# Patient Record
Sex: Female | Born: 1955 | Race: Black or African American | Hispanic: No | Smoking: Former smoker
Health system: Southern US, Community
[De-identification: ages and names within clinical notes are randomized; demographics above are authoritative.]

## PROBLEM LIST (undated history)

## (undated) ENCOUNTER — Emergency Department (HOSPITAL_COMMUNITY): Admission: EM

## (undated) DIAGNOSIS — R569 Unspecified convulsions: Secondary | ICD-10-CM

## (undated) DIAGNOSIS — G43909 Migraine, unspecified, not intractable, without status migrainosus: Secondary | ICD-10-CM

## (undated) DIAGNOSIS — F32A Depression, unspecified: Secondary | ICD-10-CM

## (undated) DIAGNOSIS — I2510 Atherosclerotic heart disease of native coronary artery without angina pectoris: Secondary | ICD-10-CM

## (undated) DIAGNOSIS — G4733 Obstructive sleep apnea (adult) (pediatric): Secondary | ICD-10-CM

## (undated) DIAGNOSIS — E119 Type 2 diabetes mellitus without complications: Secondary | ICD-10-CM

## (undated) DIAGNOSIS — I6529 Occlusion and stenosis of unspecified carotid artery: Secondary | ICD-10-CM

## (undated) DIAGNOSIS — I639 Cerebral infarction, unspecified: Secondary | ICD-10-CM

## (undated) DIAGNOSIS — N200 Calculus of kidney: Secondary | ICD-10-CM

## (undated) DIAGNOSIS — Z9889 Other specified postprocedural states: Secondary | ICD-10-CM

## (undated) DIAGNOSIS — R55 Syncope and collapse: Secondary | ICD-10-CM

## (undated) DIAGNOSIS — Z8489 Family history of other specified conditions: Secondary | ICD-10-CM

## (undated) DIAGNOSIS — I10 Essential (primary) hypertension: Secondary | ICD-10-CM

## (undated) DIAGNOSIS — R112 Nausea with vomiting, unspecified: Secondary | ICD-10-CM

## (undated) DIAGNOSIS — E785 Hyperlipidemia, unspecified: Secondary | ICD-10-CM

## (undated) DIAGNOSIS — G40309 Generalized idiopathic epilepsy and epileptic syndromes, not intractable, without status epilepticus: Secondary | ICD-10-CM

## (undated) DIAGNOSIS — R413 Other amnesia: Secondary | ICD-10-CM

## (undated) DIAGNOSIS — K219 Gastro-esophageal reflux disease without esophagitis: Secondary | ICD-10-CM

## (undated) DIAGNOSIS — Z87442 Personal history of urinary calculi: Secondary | ICD-10-CM

## (undated) HISTORY — DX: Syncope and collapse: R55

## (undated) HISTORY — PX: URETERAL STENT PLACEMENT: SHX822

## (undated) HISTORY — DX: Atherosclerotic heart disease of native coronary artery without angina pectoris: I25.10

## (undated) HISTORY — PX: LITHOTRIPSY: SUR834

## (undated) HISTORY — DX: Other amnesia: R41.3

## (undated) HISTORY — DX: Hyperlipidemia, unspecified: E78.5

## (undated) HISTORY — PX: TUBAL LIGATION: SHX77

## (undated) HISTORY — PX: CORONARY ANGIOPLASTY: SHX604

## (undated) HISTORY — DX: Occlusion and stenosis of unspecified carotid artery: I65.29

## (undated) HISTORY — PX: TOTAL ABDOMINAL HYSTERECTOMY: SHX209

## (undated) HISTORY — DX: Obstructive sleep apnea (adult) (pediatric): G47.33

## (undated) HISTORY — DX: Calculus of kidney: N20.0

## (undated) HISTORY — DX: Generalized idiopathic epilepsy and epileptic syndromes, not intractable, without status epilepticus: G40.309

## (undated) HISTORY — DX: Cerebral infarction, unspecified: I63.9

## (undated) HISTORY — PX: CARDIAC CATHETERIZATION: SHX172

---

## 2012-10-21 HISTORY — PX: CORONARY ARTERY BYPASS GRAFT: SHX141

## 2012-11-16 LAB — PULMONARY FUNCTION TEST

## 2013-01-11 ENCOUNTER — Encounter (HOSPITAL_COMMUNITY)
Admission: RE | Admit: 2013-01-11 | Discharge: 2013-01-11 | Disposition: A | Discharge status: Home / Self Care | Payer: BC Managed Care – PPO | Source: Ambulatory Visit | Attending: Cardiology | Admitting: Cardiology

## 2013-01-11 DIAGNOSIS — R079 Chest pain, unspecified: Secondary | ICD-10-CM | POA: Insufficient documentation

## 2013-01-11 DIAGNOSIS — Z5189 Encounter for other specified aftercare: Secondary | ICD-10-CM | POA: Insufficient documentation

## 2013-01-11 DIAGNOSIS — E119 Type 2 diabetes mellitus without complications: Secondary | ICD-10-CM | POA: Insufficient documentation

## 2013-01-11 DIAGNOSIS — I10 Essential (primary) hypertension: Secondary | ICD-10-CM | POA: Insufficient documentation

## 2013-01-11 DIAGNOSIS — I2510 Atherosclerotic heart disease of native coronary artery without angina pectoris: Secondary | ICD-10-CM | POA: Insufficient documentation

## 2013-01-11 NOTE — Progress Notes (Signed)
Cardiac Rehab Medication Review by a Pharmacist  Does the patient  feel that his/her medications are working for him/her?  Yes, but has been on these medications before open heart surgery.   Has the patient been experiencing any side effects to the medications prescribed?  no  Does the patient measure his/her own blood pressure or blood glucose at home?  Yes.   Does the patient have any problems obtaining medications due to transportation or finances?   no  Understanding of regimen: good Understanding of indications: good Potential of compliance: good    Samantha Huffman 01/11/2013 9:14 AM

## 2013-01-17 ENCOUNTER — Encounter (HOSPITAL_COMMUNITY): Payer: Self-pay

## 2013-01-17 ENCOUNTER — Encounter (HOSPITAL_COMMUNITY)
Admission: RE | Admit: 2013-01-17 | Discharge: 2013-01-17 | Disposition: A | Discharge status: Home / Self Care | Payer: BC Managed Care – PPO | Source: Ambulatory Visit | Attending: Cardiology | Admitting: Cardiology

## 2013-01-17 ENCOUNTER — Encounter (HOSPITAL_COMMUNITY): Payer: BC Managed Care – PPO

## 2013-01-17 LAB — GLUCOSE, CAPILLARY: Glucose-Capillary: 98 mg/dL (ref 70–99)

## 2013-01-17 NOTE — Addendum Note (Signed)
Encounter addended by: Robyne Peers, RN on: 01/17/2013 12:42 PM<BR>     Documentation filed: Notes Section

## 2013-01-17 NOTE — Progress Notes (Signed)
Pt started cardiac rehab today.  Pt tolerated light exercise without difficulty. VSS, telemetry-NSR, with PVC.  Pt c/o chest wall tenderness and soreness while using treadmill. Relieved while walking track.  Pt asymptomatic with other activities. Pt states she is currently living in [place] with her daughter, who is her caregiver.[place] her permanent residence is in [place].  Pt is having to make a tough decision whether to move to [place] permanently for assistance from her daughters.  Pt recently stopped smoking and states this has been an easy transition for her and she has not had cravings. Pt oriented to exercise equipment and routine.  Understanding verbalized.

## 2013-01-19 ENCOUNTER — Encounter (HOSPITAL_COMMUNITY)
Admission: RE | Admit: 2013-01-19 | Discharge: 2013-01-19 | Disposition: A | Discharge status: Home / Self Care | Payer: BC Managed Care – PPO | Source: Ambulatory Visit | Attending: Cardiology | Admitting: Cardiology

## 2013-01-19 ENCOUNTER — Encounter (HOSPITAL_COMMUNITY): Payer: BC Managed Care – PPO

## 2013-01-19 LAB — GLUCOSE, CAPILLARY: Glucose-Capillary: 143 mg/dL — ABNORMAL HIGH (ref 70–99)

## 2013-01-22 ENCOUNTER — Encounter (HOSPITAL_COMMUNITY)
Admission: RE | Admit: 2013-01-22 | Discharge: 2013-01-22 | Disposition: A | Discharge status: Home / Self Care | Payer: BC Managed Care – PPO | Source: Ambulatory Visit | Attending: Cardiology | Admitting: Cardiology

## 2013-01-22 ENCOUNTER — Encounter (HOSPITAL_COMMUNITY): Payer: BC Managed Care – PPO

## 2013-01-22 DIAGNOSIS — I10 Essential (primary) hypertension: Secondary | ICD-10-CM | POA: Insufficient documentation

## 2013-01-22 DIAGNOSIS — R079 Chest pain, unspecified: Secondary | ICD-10-CM | POA: Insufficient documentation

## 2013-01-22 DIAGNOSIS — Z5189 Encounter for other specified aftercare: Secondary | ICD-10-CM | POA: Insufficient documentation

## 2013-01-22 DIAGNOSIS — E119 Type 2 diabetes mellitus without complications: Secondary | ICD-10-CM | POA: Insufficient documentation

## 2013-01-22 DIAGNOSIS — I2510 Atherosclerotic heart disease of native coronary artery without angina pectoris: Secondary | ICD-10-CM | POA: Insufficient documentation

## 2013-01-24 ENCOUNTER — Encounter (HOSPITAL_COMMUNITY)
Admission: RE | Admit: 2013-01-24 | Discharge: 2013-01-24 | Disposition: A | Discharge status: Home / Self Care | Payer: BC Managed Care – PPO | Source: Ambulatory Visit | Attending: Cardiology | Admitting: Cardiology

## 2013-01-24 ENCOUNTER — Encounter (HOSPITAL_COMMUNITY): Payer: BC Managed Care – PPO

## 2013-01-24 DIAGNOSIS — I10 Essential (primary) hypertension: Secondary | ICD-10-CM | POA: Insufficient documentation

## 2013-01-24 DIAGNOSIS — R079 Chest pain, unspecified: Secondary | ICD-10-CM | POA: Insufficient documentation

## 2013-01-24 DIAGNOSIS — E119 Type 2 diabetes mellitus without complications: Secondary | ICD-10-CM | POA: Insufficient documentation

## 2013-01-24 DIAGNOSIS — I2510 Atherosclerotic heart disease of native coronary artery without angina pectoris: Secondary | ICD-10-CM | POA: Insufficient documentation

## 2013-01-24 DIAGNOSIS — Z5189 Encounter for other specified aftercare: Secondary | ICD-10-CM | POA: Insufficient documentation

## 2013-01-26 ENCOUNTER — Encounter (HOSPITAL_COMMUNITY)
Admission: RE | Admit: 2013-01-26 | Discharge: 2013-01-26 | Disposition: A | Discharge status: Home / Self Care | Payer: BC Managed Care – PPO | Source: Ambulatory Visit | Attending: Cardiology | Admitting: Cardiology

## 2013-01-26 ENCOUNTER — Encounter (HOSPITAL_COMMUNITY): Payer: BC Managed Care – PPO

## 2013-01-29 ENCOUNTER — Encounter (HOSPITAL_COMMUNITY): Payer: BC Managed Care – PPO

## 2013-01-29 ENCOUNTER — Encounter (HOSPITAL_COMMUNITY)
Admission: RE | Admit: 2013-01-29 | Discharge: 2013-01-29 | Disposition: A | Discharge status: Home / Self Care | Payer: BC Managed Care – PPO | Source: Ambulatory Visit | Attending: Cardiology | Admitting: Cardiology

## 2013-01-29 NOTE — Progress Notes (Addendum)
1478- Reviewed home exercise guidelines with patient including endpoints, temperature precautions, target heart rate and rate of perceived exertion. Pt plans to walk as her mode of home exercise. Pt voices understanding of instructions given.  Cristy Hilts, MS, ACSM CES

## 2013-01-31 ENCOUNTER — Encounter (HOSPITAL_COMMUNITY)
Admission: RE | Admit: 2013-01-31 | Discharge: 2013-01-31 | Disposition: A | Discharge status: Home / Self Care | Payer: BC Managed Care – PPO | Source: Ambulatory Visit | Attending: Cardiology | Admitting: Cardiology

## 2013-01-31 ENCOUNTER — Encounter (HOSPITAL_COMMUNITY): Payer: BC Managed Care – PPO

## 2013-02-02 ENCOUNTER — Encounter (HOSPITAL_COMMUNITY): Payer: BC Managed Care – PPO

## 2013-02-05 ENCOUNTER — Encounter (HOSPITAL_COMMUNITY)
Admission: RE | Admit: 2013-02-05 | Discharge: 2013-02-05 | Disposition: A | Discharge status: Home / Self Care | Payer: BC Managed Care – PPO | Source: Ambulatory Visit | Attending: Cardiology | Admitting: Cardiology

## 2013-02-05 ENCOUNTER — Encounter (HOSPITAL_COMMUNITY): Payer: BC Managed Care – PPO

## 2013-02-07 ENCOUNTER — Encounter (HOSPITAL_COMMUNITY)
Admission: RE | Admit: 2013-02-07 | Discharge: 2013-02-07 | Disposition: A | Discharge status: Home / Self Care | Payer: BC Managed Care – PPO | Source: Ambulatory Visit | Attending: Cardiology | Admitting: Cardiology

## 2013-02-07 ENCOUNTER — Encounter (HOSPITAL_COMMUNITY): Payer: BC Managed Care – PPO

## 2013-02-09 ENCOUNTER — Encounter (HOSPITAL_COMMUNITY)
Admission: RE | Admit: 2013-02-09 | Discharge: 2013-02-09 | Disposition: A | Discharge status: Home / Self Care | Payer: BC Managed Care – PPO | Source: Ambulatory Visit | Attending: Cardiology | Admitting: Cardiology

## 2013-02-09 ENCOUNTER — Encounter (HOSPITAL_COMMUNITY): Payer: BC Managed Care – PPO

## 2013-02-12 ENCOUNTER — Encounter (HOSPITAL_COMMUNITY): Payer: BC Managed Care – PPO

## 2013-02-12 ENCOUNTER — Emergency Department (HOSPITAL_COMMUNITY)
Admission: EM | Admit: 2013-02-12 | Discharge: 2013-02-12 | Disposition: A | Discharge status: Home / Self Care | Payer: BC Managed Care – PPO | Attending: Emergency Medicine | Admitting: Emergency Medicine

## 2013-02-12 ENCOUNTER — Encounter (HOSPITAL_COMMUNITY): Payer: Self-pay | Admitting: Adult Health

## 2013-02-12 ENCOUNTER — Emergency Department (HOSPITAL_COMMUNITY): Payer: BC Managed Care – PPO

## 2013-02-12 DIAGNOSIS — G40909 Epilepsy, unspecified, not intractable, without status epilepticus: Secondary | ICD-10-CM | POA: Insufficient documentation

## 2013-02-12 DIAGNOSIS — Z79899 Other long term (current) drug therapy: Secondary | ICD-10-CM | POA: Insufficient documentation

## 2013-02-12 DIAGNOSIS — R079 Chest pain, unspecified: Secondary | ICD-10-CM

## 2013-02-12 DIAGNOSIS — I10 Essential (primary) hypertension: Secondary | ICD-10-CM | POA: Insufficient documentation

## 2013-02-12 DIAGNOSIS — Z7982 Long term (current) use of aspirin: Secondary | ICD-10-CM | POA: Insufficient documentation

## 2013-02-12 DIAGNOSIS — E119 Type 2 diabetes mellitus without complications: Secondary | ICD-10-CM | POA: Insufficient documentation

## 2013-02-12 DIAGNOSIS — Z951 Presence of aortocoronary bypass graft: Secondary | ICD-10-CM | POA: Insufficient documentation

## 2013-02-12 DIAGNOSIS — Z87891 Personal history of nicotine dependence: Secondary | ICD-10-CM | POA: Insufficient documentation

## 2013-02-12 HISTORY — DX: Unspecified convulsions: R56.9

## 2013-02-12 HISTORY — DX: Essential (primary) hypertension: I10

## 2013-02-12 LAB — CBC
HCT: 37.1 % (ref 36.0–46.0)
Hemoglobin: 12.2 g/dL (ref 12.0–15.0)
MCH: 29.1 pg (ref 26.0–34.0)
MCHC: 32.9 g/dL (ref 30.0–36.0)
MCV: 88.5 fL (ref 78.0–100.0)
RBC: 4.19 MIL/uL (ref 3.87–5.11)

## 2013-02-12 LAB — BASIC METABOLIC PANEL
BUN: 16 mg/dL (ref 6–23)
CO2: 27 mEq/L (ref 19–32)
Calcium: 9.4 mg/dL (ref 8.4–10.5)
Creatinine, Ser: 0.88 mg/dL (ref 0.50–1.10)
GFR calc non Af Amer: 72 mL/min — ABNORMAL LOW (ref >90)
Glucose, Bld: 155 mg/dL — ABNORMAL HIGH (ref 70–99)
Sodium: 137 mEq/L (ref 135–145)

## 2013-02-12 IMAGING — CR DG CHEST 2V
2 series · 2 of 2 positions shown · non-contrast
Comparison: None.

CLINICAL DATA: Chest pain.

CHEST - 2 VIEW

[w chest pa]
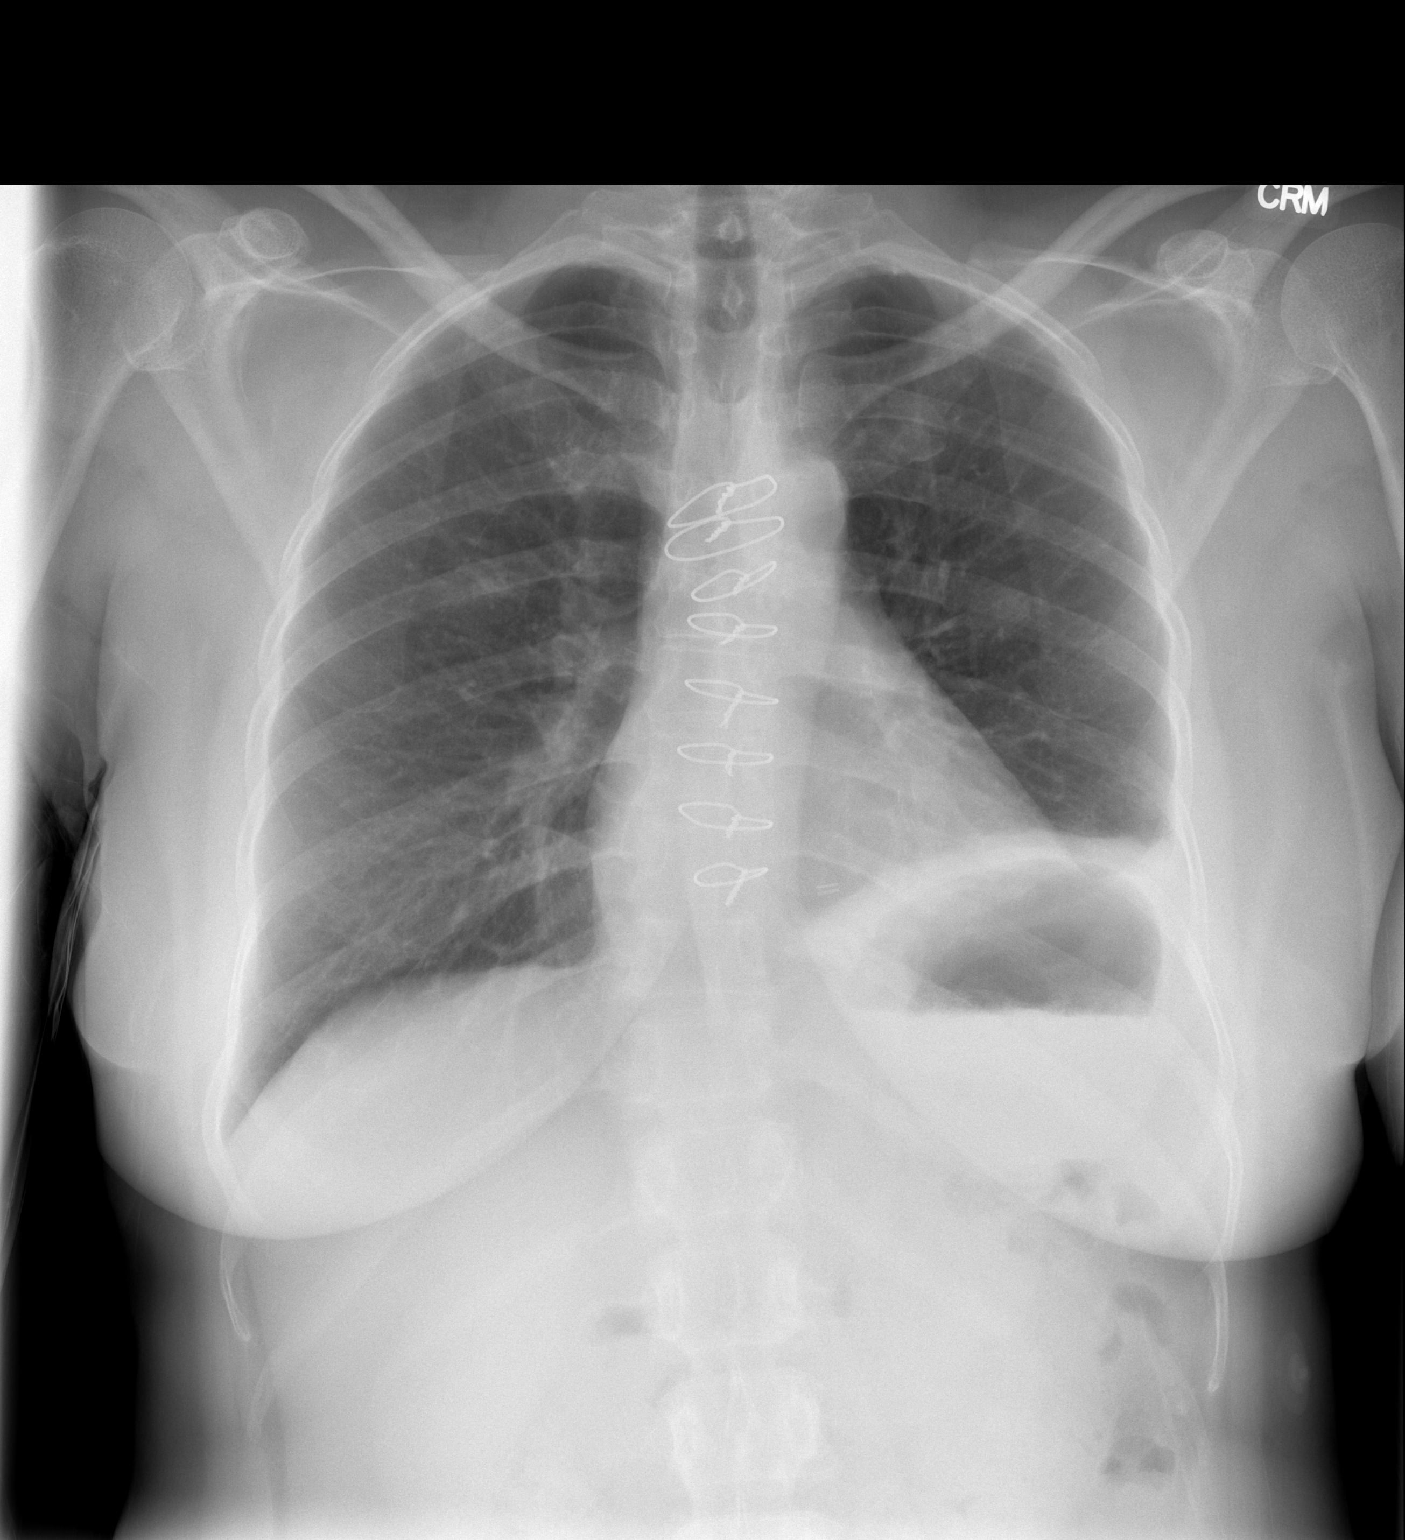

[w chest lat]
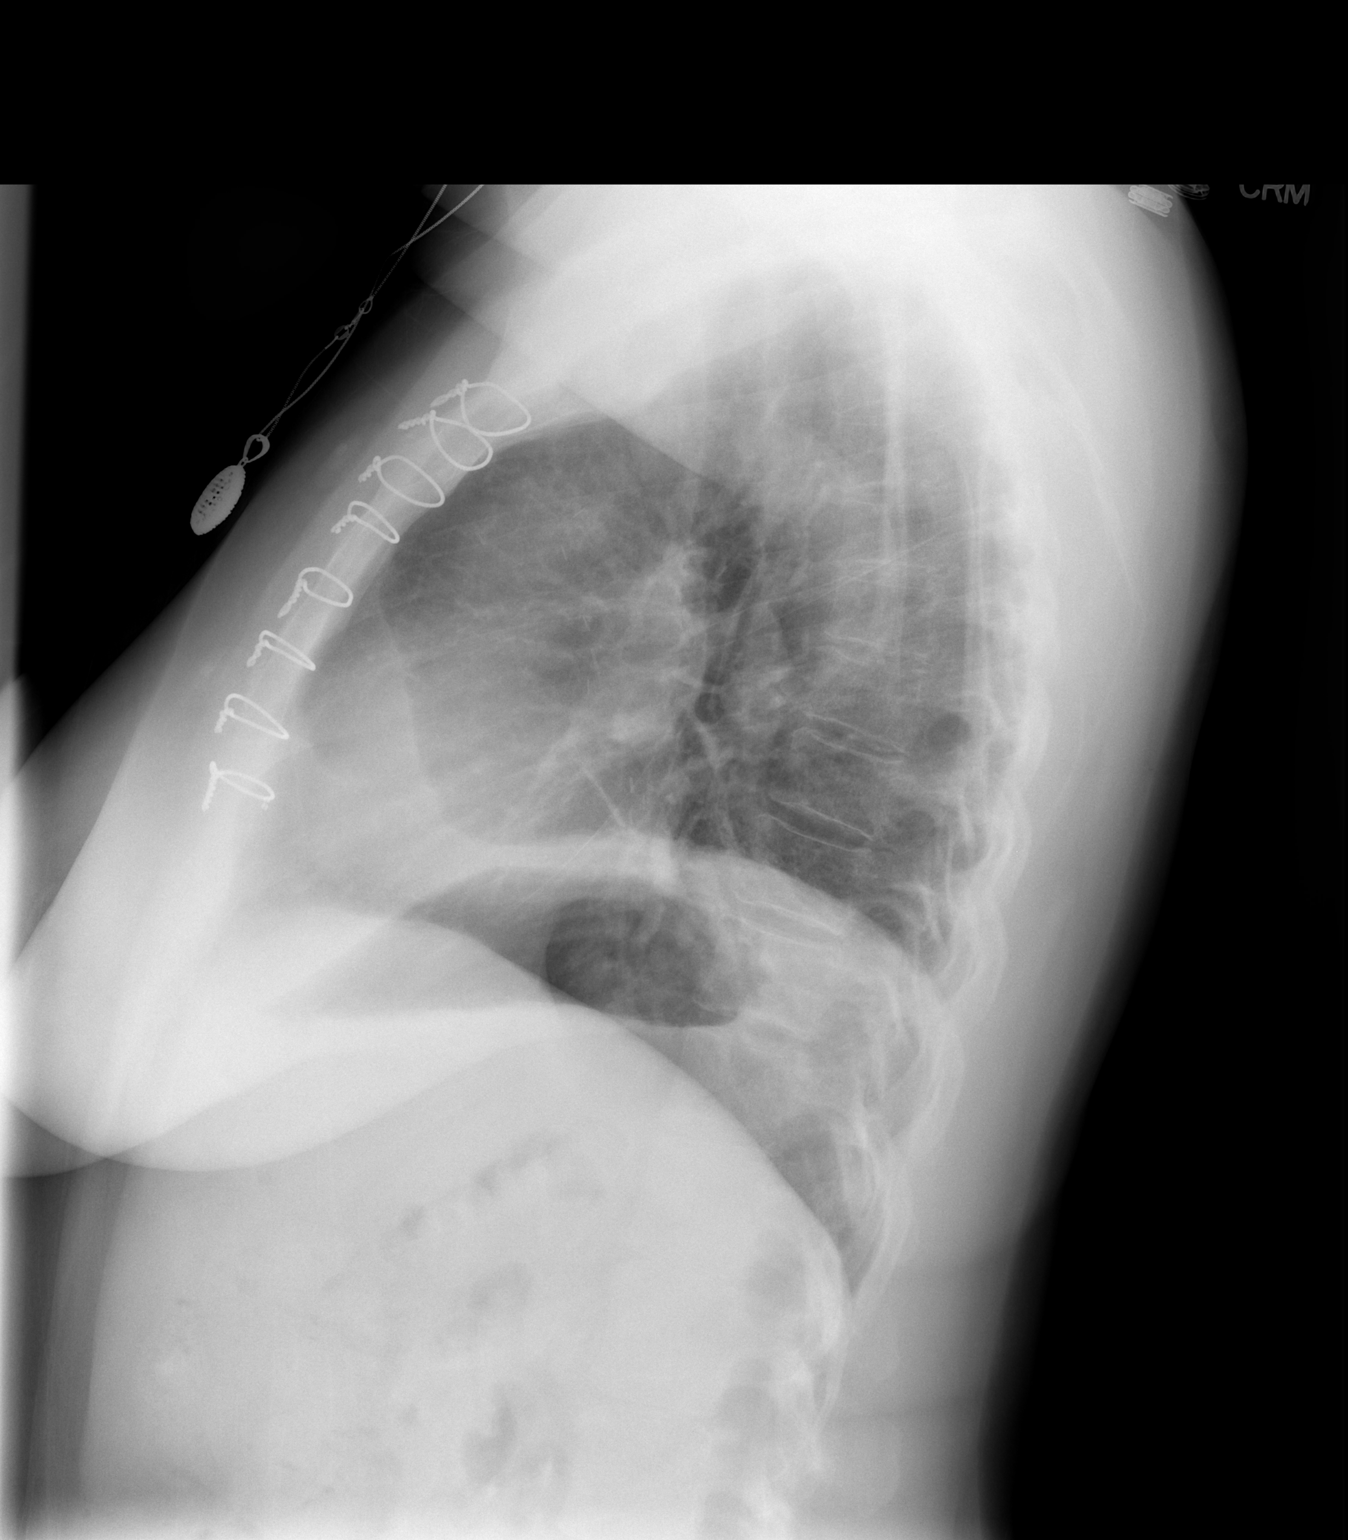

[2 of 2 positions shown; findings below may reference images not displayed]

FINDINGS: Elevation of the left hemidiaphragm.  Blunting of the
left costophrenic angle likely reflects pleural
thickening/scarring.  Right lung is clear.  Prior CABG.  Heart is
normal size.  No acute bony abnormality.
IMPRESSION: Elevation of the left hemidiaphragm with left pleural
thickening/scarring.  No acute findings.

## 2013-02-12 MED ORDER — ASPIRIN 81 MG PO CHEW
243.0000 mg | CHEWABLE_TABLET | Freq: Once | ORAL | Status: AC
  Administered 2013-02-12: 243 mg via ORAL
  Filled 2013-02-12: qty 3

## 2013-02-12 MED ORDER — ASPIRIN 325 MG PO TABS: 325.0000 mg | ORAL_TABLET | ORAL | Status: DC

## 2013-02-12 MED ORDER — NITROGLYCERIN 0.4 MG SL SUBL
0.4000 mg | SUBLINGUAL_TABLET | SUBLINGUAL | Status: DC | PRN
  Administered 2013-02-12: 0.4 mg via SUBLINGUAL

## 2013-02-12 MED ORDER — SODIUM CHLORIDE 0.9 % IV SOLN
1000.0000 mL | INTRAVENOUS | Status: DC
  Administered 2013-02-12: 1000 mL via INTRAVENOUS

## 2013-02-12 NOTE — ED Notes (Signed)
No old EKG new EKG given to Dr Deretha Emory

## 2013-02-12 NOTE — ED Provider Notes (Signed)
History     CSN: [id number]  Arrival date & time 02/12/13  0020   First MD Initiated Contact with Patient 02/12/13 [phone]      Chief Complaint  Patient presents with  . Chest Pain    (Consider location/radiation/quality/duration/timing/severity/associated sxs/prior treatment) HPI  Patient presents with concerns of chest pain.  The pain began earlier today.  Since onset she has had episodes of pain throughout the day.  The pain is sternal, nonradiating, sore, moderate, improved with rest.  The pain is not exertional nor pleuritic.  The patient states that prior to today, she had no similar pain episodes. There is no ongoing lightheadedness, nausea, vomiting, confusion, disorientation, extremity weakness or pain. No dyspnea. Patient has a notable history [place]. She receives the cardiac rehabilitation therapy here, and goes 3 times a week.   Past Medical History  Diagnosis Date  . S/P CABG x 2   . Hypertension   . Diabetes mellitus without complication   . Seizures     History reviewed. No pertinent past surgical history.  History reviewed. No pertinent family history.  History  Substance Use Topics  . Smoking status: Former Smoker -- 0.50 packs/day for 40 years    Types: Cigarettes  . Smokeless tobacco: Former Neurosurgeon    Quit date: 11/20/2012  . Alcohol Use: No    OB History   Grav Para Term Preterm Abortions TAB SAB Ect Mult Living                  Review of Systems  Constitutional:       Per HPI, otherwise negative  HENT:       Per HPI, otherwise negative  Respiratory:       Per HPI, otherwise negative  Cardiovascular:       Per HPI, otherwise negative  Gastrointestinal: Negative for vomiting.  Endocrine:       Negative aside from HPI  Genitourinary:       Neg aside from HPI   Musculoskeletal:       Per HPI, otherwise negative  Skin: Negative.   Neurological: Negative for syncope.    Allergies  Review of patient's  allergies indicates no known allergies.  Home Medications   Current Outpatient Rx  Name  Route  Sig  Dispense  Refill  . amLODipine-atorvastatin (CADUET) 5-20 MG per tablet   Oral   Take 1 tablet by mouth daily.         Marland Kitchen aspirin 81 MG tablet   Oral   Take 81 mg by mouth daily.         . clopidogrel (PLAVIX) 75 MG tablet   Oral   Take 75 mg by mouth daily.         . divalproex (DEPAKOTE ER) 500 MG 24 hr tablet   Oral   Take 500 mg by mouth 2 (two) times daily.         Marland Kitchen lisinopril (PRINIVIL,ZESTRIL) 10 MG tablet   Oral   Take 10 mg by mouth daily.         . metoprolol succinate (TOPROL-XL) 25 MG 24 hr tablet   Oral   Take 25 mg by mouth daily.           BP 138/78  Pulse 92  Temp(Src) 98.1 F (36.7 C) (Oral)  Resp 22  SpO2 100%  Physical Exam  Nursing note and vitals reviewed. Constitutional: She is oriented to person, place, and  time. She appears well-developed and well-nourished. No distress.  HENT:  Head: Normocephalic and atraumatic.  Eyes: Conjunctivae and EOM are normal.  Cardiovascular: Normal rate and regular rhythm.   Pulmonary/Chest: Effort normal and breath sounds normal. No stridor. No respiratory distress. Chest wall is not dull to percussion. She exhibits tenderness and bony tenderness. She exhibits no mass, no laceration, no crepitus, no edema, no deformity, no swelling and no retraction.    Abdominal: She exhibits no distension.  Musculoskeletal: She exhibits no edema.  Neurological: She is alert and oriented to person, place, and time. No cranial nerve deficit.  Skin: Skin is warm and dry.  Psychiatric: She has a normal mood and affect.    ED Course  Procedures (including critical care time)  Labs Reviewed  CBC  BASIC METABOLIC PANEL  PROTIME-INR  POCT I-STAT TROPONIN I   No results found.   No diagnosis found.  Pulse ox 99% room air normal Cardiac 85 sinus rhythm normal    Date: 02/12/2013  Rate: 90  Rhythm:  normal sinus rhythm  QRS Axis: normal  Intervals: normal  ST/T Wave abnormalities: nonspecific T wave changes  Conduction Disutrbances:artefact  Narrative Interpretation:   Old EKG Reviewed: none available BORDERLINE   3:09 AM Troponin #2 is negative.  The patient remains in no distress.  There is still tenderness to palpation about the chest. The patient now specifies that she had the onset of pain after a seizure a few days ago. MDM  This female with a notable history of bypass surgery several months ago, now presents with concerns of chest pain.  On exam the patient is awake and alert, in no distress with unremarkable vital signs, her reassuring labs, nonischemic ECG.  With the tenderness to palpation of the sternum, the patient's report of pain that began after a seizure, there suspicion for a musculoskeletal etiology.  Though the patient does have her factors for CAD, the absence of any significant ongoing pain, the aforementioned tenderness to palpation, the patient's reported compliance with Plavix and aspirin dosing makes ACS unlikely.  Patient will be discharged to follow up with her cardiologist here.        Gerhard Munch, MD 02/12/13 [phone]

## 2013-02-12 NOTE — ED Notes (Signed)
Presents with intermittent sharp central chest pain that began 6/22 at 10 am and continued to worsen through out the day.  Pt is tearful and unable to state what makes pain better and worse. She is tearful. Post CABG from [facility].

## 2013-02-13 ENCOUNTER — Telehealth (HOSPITAL_COMMUNITY): Payer: Self-pay | Admitting: Cardiac Rehabilitation

## 2013-02-13 NOTE — Telephone Encounter (Signed)
Message received from pt reporting chest pain.  Returned call spoke to pt dtr.  Pt cp evaluated in ED seen today by Dr. Mayford Knife, has upcoming appt with CVTS to evaluate chest wall tenderness. Dr. Mayford Knife instructed to hold off on cardiac rehab this week.  Will request office note from Dr. Mayford Knife.  Will f/u after CVTS evaluation.

## 2013-02-13 NOTE — Telephone Encounter (Signed)
Message received from pt stating she is having chest pain.  Spoke to pt dtr.  She was seen today by Dr. Mayford Knife and has upcoming appt with CVTS for evaluation.  Pt instructed to not exercise until further evaluation.  Office note requested from Dr. Mayford Knife.  Pt has appt with CVTS 02/14/13.  Will f/u with pt after that evaluation

## 2013-02-14 ENCOUNTER — Ambulatory Visit (INDEPENDENT_AMBULATORY_CARE_PROVIDER_SITE_OTHER): Payer: BC Managed Care – PPO | Admitting: Surgery

## 2013-02-14 ENCOUNTER — Encounter (HOSPITAL_COMMUNITY): Payer: BC Managed Care – PPO

## 2013-02-14 ENCOUNTER — Encounter: Payer: Self-pay | Admitting: Surgery

## 2013-02-14 VITALS — BP 124/80 | HR 70 | Resp 16 | Ht 62.0 in | Wt 135.0 lb

## 2013-02-14 DIAGNOSIS — Z951 Presence of aortocoronary bypass graft: Secondary | ICD-10-CM

## 2013-02-14 DIAGNOSIS — R0789 Other chest pain: Secondary | ICD-10-CM

## 2013-02-14 DIAGNOSIS — I2510 Atherosclerotic heart disease of native coronary artery without angina pectoris: Secondary | ICD-10-CM

## 2013-02-14 NOTE — Progress Notes (Signed)
[address]       [place] [address]             [phone]        PCP is Quintella Reichert, MD Referring Provider is Quintella Reichert, MD  Chief Complaint  Patient presents with  . Chest Pain    sternal pain [place].  Has been to the ER x 1 for same.  Normal cxr/findings.  Saw Dr. Mayford Knife yesterday and she has referred her here to r/o possible infection.    HPI:  The patient is a 57 year old woman who lives in [place] and underwent coronary bypass graft surgery x2 on 11/20/2012. She had a left internal mammary graft to the LAD and a left radial artery graft to another vessel. Preoperative cardiac symptoms were exertional fatigue and aching and heaviness in the left arm. She's been staying in [place] postoperatively with her daughter. She is going to cardiac rehabilitation and doing well until last Sunday when she developed sharp stabbing pain in the middle of her chest. She said this was not related to exertion. She continued to do her normal daily activities and went to bed but then began having pain when she laid on her left side off and on throughout the night. She went to the emergency room for evaluation and it was felt to be musculoskeletal in origin. She was told to take ibuprofen as needed. She said that she has continued to have intermittent sharp pain in middle of the chest beneath the incision. She said that she has had some exertional fatigue and a couple of brief episodes of dull pain in her left arm. She says that her chest incision is very tender. She denies any fever or chills. Past Medical History  Diagnosis Date  . S/P CABG x 2   . Hypertension   . Diabetes mellitus without complication   . Seizures     History reviewed. No pertinent past surgical history.  History reviewed. No pertinent family history.  Social History History  Substance Use Topics  . Smoking status: Former Smoker -- 0.50 packs/day  for 40 years    Types: Cigarettes  . Smokeless tobacco: Former Neurosurgeon    Quit date: 11/20/2012  . Alcohol Use: No    Current Outpatient Prescriptions  Medication Sig Dispense Refill  . amLODipine-atorvastatin (CADUET) 5-20 MG per tablet Take 1 tablet by mouth daily.      Marland Kitchen aspirin 81 MG tablet Take 81 mg by mouth daily.      . clopidogrel (PLAVIX) 75 MG tablet Take 75 mg by mouth daily.      . divalproex (DEPAKOTE ER) 500 MG 24 hr tablet Take 500 mg by mouth 2 (two) times daily.      Marland Kitchen lisinopril (PRINIVIL,ZESTRIL) 10 MG tablet Take 10 mg by mouth daily.      . metoprolol succinate (TOPROL-XL) 25 MG 24 hr tablet Take 25 mg by mouth daily.       No current facility-administered medications for this visit.    No Known Allergies  Review of Systems  Constitutional: Positive for activity change and fatigue. Negative for fever and chills.  HENT: Negative.   Eyes: Negative.   Respiratory: Negative for cough, chest tightness and shortness of breath.   Cardiovascular: Positive for chest pain. Negative for palpitations and leg swelling.  Gastrointestinal: Negative.   Genitourinary: Negative.   Musculoskeletal: Negative for myalgias, back pain  and arthralgias.  Neurological: Negative.     BP 124/80  Pulse 70  Resp 16  Ht 5\' 2"  (1.575 m)  Wt 135 lb (61.236 kg)  BMI 24.69 kg/m2  SpO2 97% Physical Exam  Constitutional: She appears well-developed and well-nourished. No distress.  Chest wall: The sternotomy incision is healing well. There is a slight pinkish discoloration probably related to the subcuticular suture that is dissolving. There is a small stitch protruding at the bottom of the incision and I pulled on this with a pair of forceps and removed a small piece of suture. There is tenderness and hypersensitivity along the incision. This extends out from the incision on both sides. The sternum feels stable with coughing. I don't feel any sternal wires protruding up into the  incision. Cardiac: Regular rate and rhythm with normal heart sounds. Lungs: Clear    Diagnostic Tests:  Her recent chest x-ray was reviewed and sternal wires are intact. There is elevation of left hemidiaphragm but the lungs are clear and there are no pleural effusions.  Impression:  I think the sternal incision is healing as expected. I don't see any signs of infection. I'm not sure why she just began having pain on Sunday but it is possible that she could have pulled something with activity or cardiac rehabilitation. It is not uncommon to have some sensitivity along the incision for many months postoperatively. Her symptoms don't really sound ischemic although she does relate some persistent exertional fatigue and pain in her left arm. She did have a left radial artery harvest. I've recommended continued observation and pain medication as needed. I told her I would be happy to see her back if this is not progressively improved over the next couple months. Occasionally we have to remove sternal wires for persistent pain but that is not very common and I would certainly wait a while before considering that.  Plan:  She will continue followup with Dr. Mayford Knife and will return to see me as needed.

## 2013-02-16 ENCOUNTER — Encounter (HOSPITAL_COMMUNITY): Admission: RE | Admit: 2013-02-16 | Payer: BC Managed Care – PPO | Source: Ambulatory Visit

## 2013-02-16 ENCOUNTER — Encounter (HOSPITAL_COMMUNITY): Payer: BC Managed Care – PPO

## 2013-02-19 ENCOUNTER — Encounter (HOSPITAL_COMMUNITY): Payer: BC Managed Care – PPO

## 2013-02-21 ENCOUNTER — Encounter (HOSPITAL_COMMUNITY): Payer: BC Managed Care – PPO

## 2013-02-21 ENCOUNTER — Encounter (HOSPITAL_COMMUNITY)
Admission: RE | Admit: 2013-02-21 | Discharge: 2013-02-21 | Disposition: A | Discharge status: Home / Self Care | Payer: BC Managed Care – PPO | Source: Ambulatory Visit | Attending: Cardiology | Admitting: Cardiology

## 2013-02-21 DIAGNOSIS — E119 Type 2 diabetes mellitus without complications: Secondary | ICD-10-CM | POA: Insufficient documentation

## 2013-02-21 DIAGNOSIS — I2510 Atherosclerotic heart disease of native coronary artery without angina pectoris: Secondary | ICD-10-CM | POA: Insufficient documentation

## 2013-02-21 DIAGNOSIS — I10 Essential (primary) hypertension: Secondary | ICD-10-CM | POA: Insufficient documentation

## 2013-02-21 DIAGNOSIS — R079 Chest pain, unspecified: Secondary | ICD-10-CM | POA: Insufficient documentation

## 2013-02-21 DIAGNOSIS — Z5189 Encounter for other specified aftercare: Secondary | ICD-10-CM | POA: Insufficient documentation

## 2013-02-23 ENCOUNTER — Encounter (HOSPITAL_COMMUNITY): Payer: BC Managed Care – PPO

## 2013-02-26 ENCOUNTER — Encounter (HOSPITAL_COMMUNITY): Payer: BC Managed Care – PPO

## 2013-02-28 ENCOUNTER — Encounter (HOSPITAL_COMMUNITY)
Admission: RE | Admit: 2013-02-28 | Discharge: 2013-02-28 | Disposition: A | Discharge status: Home / Self Care | Payer: BC Managed Care – PPO | Source: Ambulatory Visit | Attending: Cardiology | Admitting: Cardiology

## 2013-02-28 ENCOUNTER — Encounter (HOSPITAL_COMMUNITY): Payer: BC Managed Care – PPO

## 2013-02-28 NOTE — Progress Notes (Signed)
PSYCHOSOCIAL ASSESSMENT  Pt psychosocial assessment reveals no barriers to rehab participation.  Pt quality of life is slightly altered by her physical constraints which limits her ability to perform tasks as prior to her illness.  Pt admits this illness has been difficult for her emotionally.  In addition, she is faced with life changing decisions of benefits of moving to [place] permanently to live with her daughter to be near family in event of worsening health.  This is difficult for pt as she does not completely feel ready to give up her independence and leave her home in [place].  However pt reports she does have a strong faith base which helps her at difficult times such as this.   Pt exhibits positive coping skills and has supportive family.  Offered emotional support and reassurance.  Will continue to monitor.

## 2013-03-02 ENCOUNTER — Encounter (HOSPITAL_COMMUNITY): Payer: BC Managed Care – PPO

## 2013-03-02 ENCOUNTER — Encounter (HOSPITAL_COMMUNITY)
Admission: RE | Admit: 2013-03-02 | Discharge: 2013-03-02 | Disposition: A | Discharge status: Home / Self Care | Payer: BC Managed Care – PPO | Source: Ambulatory Visit | Attending: Cardiology | Admitting: Cardiology

## 2013-03-05 ENCOUNTER — Encounter (HOSPITAL_COMMUNITY): Payer: BC Managed Care – PPO

## 2013-03-07 ENCOUNTER — Encounter (HOSPITAL_COMMUNITY): Payer: BC Managed Care – PPO

## 2013-03-09 ENCOUNTER — Encounter (HOSPITAL_COMMUNITY): Payer: BC Managed Care – PPO

## 2013-03-12 ENCOUNTER — Encounter (HOSPITAL_COMMUNITY): Payer: BC Managed Care – PPO

## 2013-03-14 ENCOUNTER — Encounter (HOSPITAL_COMMUNITY): Payer: BC Managed Care – PPO

## 2013-03-14 ENCOUNTER — Encounter (HOSPITAL_COMMUNITY)
Admission: RE | Admit: 2013-03-14 | Discharge: 2013-03-14 | Disposition: A | Discharge status: Home / Self Care | Payer: BC Managed Care – PPO | Source: Ambulatory Visit | Attending: Cardiology | Admitting: Cardiology

## 2013-03-16 ENCOUNTER — Encounter (HOSPITAL_COMMUNITY): Payer: BC Managed Care – PPO

## 2013-03-19 ENCOUNTER — Encounter (HOSPITAL_COMMUNITY)
Admission: RE | Admit: 2013-03-19 | Discharge: 2013-03-19 | Disposition: A | Discharge status: Home / Self Care | Payer: BC Managed Care – PPO | Source: Ambulatory Visit | Attending: Cardiology | Admitting: Cardiology

## 2013-03-19 ENCOUNTER — Encounter (HOSPITAL_COMMUNITY): Payer: BC Managed Care – PPO

## 2013-03-21 ENCOUNTER — Encounter: Payer: Self-pay | Admitting: Cardiothoracic Surgery

## 2013-03-21 ENCOUNTER — Ambulatory Visit (INDEPENDENT_AMBULATORY_CARE_PROVIDER_SITE_OTHER): Payer: BC Managed Care – PPO | Admitting: Cardiothoracic Surgery

## 2013-03-21 ENCOUNTER — Encounter (HOSPITAL_COMMUNITY)
Admission: RE | Admit: 2013-03-21 | Discharge: 2013-03-21 | Disposition: A | Discharge status: Home / Self Care | Payer: BC Managed Care – PPO | Source: Ambulatory Visit | Attending: Cardiology | Admitting: Cardiology

## 2013-03-21 ENCOUNTER — Encounter (HOSPITAL_COMMUNITY): Payer: BC Managed Care – PPO

## 2013-03-21 VITALS — BP 124/72 | HR 74 | Resp 20 | Ht 62.0 in | Wt 135.0 lb

## 2013-03-21 DIAGNOSIS — L7682 Other postprocedural complications of skin and subcutaneous tissue: Secondary | ICD-10-CM

## 2013-03-21 DIAGNOSIS — R209 Unspecified disturbances of skin sensation: Secondary | ICD-10-CM

## 2013-03-21 DIAGNOSIS — I2510 Atherosclerotic heart disease of native coronary artery without angina pectoris: Secondary | ICD-10-CM

## 2013-03-21 DIAGNOSIS — Z951 Presence of aortocoronary bypass graft: Secondary | ICD-10-CM

## 2013-03-21 NOTE — Progress Notes (Signed)
PCP is Quintella Reichert, MD Referring Provider is Quintella Reichert, MD  Chief Complaint  Patient presents with  . Routine Post Op    C/O small knot with redness at distal end of sternal incision, with mild discomfort, S/P CABG    HPI: 57 year old female had CABG x24 months ago in [place]. She returns to the office complaining of tenderness at the lower part of the sternal incision. Incisional rales well-healed and sternum is stable. There is a small suture granuloma at the inferior aspect of the sternal incision without drainage or cellulitis.  Under local anesthesia and sterile prep this area was excised. There is some scar tissue in the subcutaneous tissue probably reaction from a absorbable suture. The area was closed with a single 3-0 nylon suture and a dressing was applied. The patient return for suture removal in 5 days.   Past Medical History  Diagnosis Date  . S/P CABG x 2   . Hypertension   . Diabetes mellitus without complication   . Seizures     No past surgical history on file.  No family history on file.  Social History History  Substance Use Topics  . Smoking status: Former Smoker -- 0.50 packs/day for 40 years    Types: Cigarettes  . Smokeless tobacco: Former Neurosurgeon    Quit date: 11/20/2012  . Alcohol Use: No    Current Outpatient Prescriptions  Medication Sig Dispense Refill  . amLODipine-atorvastatin (CADUET) 5-20 MG per tablet Take 1 tablet by mouth daily.      Marland Kitchen aspirin 81 MG tablet Take 81 mg by mouth daily.      . clopidogrel (PLAVIX) 75 MG tablet Take 75 mg by mouth daily.      . divalproex (DEPAKOTE ER) 500 MG 24 hr tablet Take 500 mg by mouth 2 (two) times daily.      Marland Kitchen lisinopril (PRINIVIL,ZESTRIL) 10 MG tablet Take 10 mg by mouth daily.      . metoprolol succinate (TOPROL-XL) 25 MG 24 hr tablet Take 25 mg by mouth daily.       No current facility-administered medications for this visit.    No Known Allergies  Review of Systems  BP 124/72   Pulse 74  Resp 20  Ht 5\' 2"  (1.575 m)  Wt 135 lb (61.236 kg)  BMI 24.69 kg/m2  SpO2 92% Physical Exam Sternum stable Lungs clear Cardiac rhythm regular  Diagnostic Tests:   Impression: Suture granuloma of lower aspect of sternal incision, excised in the office  Plan: Return in 5 days for suture removal

## 2013-03-22 ENCOUNTER — Ambulatory Visit (INDEPENDENT_AMBULATORY_CARE_PROVIDER_SITE_OTHER): Payer: BC Managed Care – HMO | Admitting: Neurology

## 2013-03-22 ENCOUNTER — Encounter: Payer: Self-pay | Admitting: Neurology

## 2013-03-22 VITALS — BP 127/71 | HR 66 | Ht 62.25 in | Wt 142.0 lb

## 2013-03-22 DIAGNOSIS — G40309 Generalized idiopathic epilepsy and epileptic syndromes, not intractable, without status epilepticus: Secondary | ICD-10-CM

## 2013-03-22 DIAGNOSIS — R413 Other amnesia: Secondary | ICD-10-CM

## 2013-03-22 NOTE — Progress Notes (Signed)
Reason for visit: Seizures  Samantha Huffman is a 57 y.o. female  History of present illness:  [name] is a 57 year old right-handed black female with a history of seizures since 2011. The patient indicates that she was at work at that time, and she had onset of trembling on the arms. The patient indicated that she could not see anything, but she could hear what was going on around her. The patient has not had problems biting the tongue or losing control of the bowels or the bladder. In March 2014, the patient underwent a two-vessel CABG procedure, and since that time, the seizure frequency has increased. The patient has had memory issues since 2011, and this also has worsened since the coronary artery bypass grafting. The patient has been on Depakote since 2011. The patient has not had a recent EEG or MRI evaluation of the brain. The patient denies any focal numbness or weakness of the face, arms, or legs. The patient denies any weakness of the extremities. The patient has not had any balance issues. The patient is not operating motor vehicle at this time. A recent Depakote level was 35, and the dose was increased to 500 mg twice daily. The patient is sent to this office for further evaluation.  Past Medical History  Diagnosis Date  . S/P CABG x 2   . Hypertension   . Diabetes mellitus without complication   . Seizures   . 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 Memory deficit 03/22/2013  . Dyslipidemia     Past Surgical History  Procedure Laterality Date  . Coronary artery bypass graft  March, 2014    2 vessel  . Abdominal hysterectomy    . Cesarean section      Family History  Problem Relation Age of Onset  . Cancer Father   . Diabetes Father   . Diabetes Brother   . Stroke Brother   . Diabetes Brother   . Heart disease Brother     Social history:  reports that she has quit smoking. Her smoking use included Cigarettes. She has a  20 pack-year smoking history. She quit smokeless tobacco use about 4 months ago. She reports that  drinks alcohol. She reports that she does not use illicit drugs.  Medications:  Current Outpatient Prescriptions on File Prior to Visit  Medication Sig Dispense Refill  . amLODipine-atorvastatin (CADUET) 5-20 MG per tablet Take 1 tablet by mouth daily.      Marland Kitchen aspirin 81 MG tablet Take 81 mg by mouth daily.      . clopidogrel (PLAVIX) 75 MG tablet Take 75 mg by mouth daily.      . divalproex (DEPAKOTE ER) 500 MG 24 hr tablet Take 500 mg by mouth 2 (two) times daily.      Marland Kitchen lisinopril (PRINIVIL,ZESTRIL) 10 MG tablet Take 10 mg by mouth daily.      . metoprolol succinate (TOPROL-XL) 25 MG 24 hr tablet Take 25 mg by mouth daily.       No current facility-administered medications on file prior to visit.    Allergies: No Known Allergies  ROS:  Out of a complete 14 system review of symptoms, the patient complains only of the following symptoms, and all other reviewed systems are negative.  Snoring Allergies Memory loss, confusion Seizures  Blood pressure 127/71, pulse 66, height 5' 2.25" (1.581 m), weight 142 lb (64.411 kg).  Physical Exam  General: The patient is alert and cooperative at  the time of the examination.  Head: Pupils are equal, round, and reactive to light. Discs are flat bilaterally.  Neck: The neck is supple, no carotid bruits are noted.  Respiratory: The respiratory examination is clear.  Cardiovascular: The cardiovascular examination reveals a regular rate and rhythm, no obvious murmurs or rubs are noted.  Skin: Extremities are without significant edema.  Neurologic Exam  Mental status: Mini-Mental status examination done today shows a total score 24/30. The patient is able to name 4 animals in 60 seconds.  Cranial nerves: Facial symmetry is present. There is good sensation of the face to pinprick and soft touch bilaterally. The strength of the facial muscles and  the muscles to head turning and shoulder shrug are normal bilaterally. Speech is well enunciated, no aphasia or dysarthria is noted. Speech is slow and deliberate. Extraocular movements are full. Visual fields are full.  Motor: The motor testing reveals 5 over 5 strength of all 4 extremities. Good symmetric motor tone is noted throughout.  Sensory: Sensory testing is intact to pinprick, soft touch, vibration sensation, and position sense on all 4 extremities. No evidence of extinction is noted.  Coordination: Cerebellar testing reveals good finger-nose-finger and heel-to-shin bilaterally.  Gait and station: Gait is normal. Tandem gait is normal. Romberg is negative. No drift is seen.  Reflexes: Deep tendon reflexes are symmetric and normal bilaterally. Toes are downgoing bilaterally.   Assessment/Plan:  One. History seizures  2. Memory disturbance  The patient has recently had an increase in the Depakote level. The patient has a long-standing history of memory problems that are worsening over time, and his seizure episodes are also getting worse. The patient has a subtherapeutic Depakote level, and the dosing has been increased. The patient is not to operate a motor vehicle. The memory issues will need to be followed over time. Blood work will be done to include an ammonia level to ensure that the Depakote is an etiology of the cognitive changes. The patient followup in 4 months.  Marlan Palau MD 03/22/2013 8:05 PM  [facility] [address] [place], [place] [address]  Phone [phone] Fax [phone]

## 2013-03-23 ENCOUNTER — Telehealth: Payer: Self-pay | Admitting: Neurology

## 2013-03-23 ENCOUNTER — Encounter (HOSPITAL_COMMUNITY): Payer: BC Managed Care – PPO

## 2013-03-23 ENCOUNTER — Encounter (HOSPITAL_COMMUNITY)
Admission: RE | Admit: 2013-03-23 | Discharge: 2013-03-23 | Disposition: A | Discharge status: Home / Self Care | Payer: BC Managed Care – PPO | Source: Ambulatory Visit | Attending: Cardiology | Admitting: Cardiology

## 2013-03-23 DIAGNOSIS — R079 Chest pain, unspecified: Secondary | ICD-10-CM | POA: Insufficient documentation

## 2013-03-23 DIAGNOSIS — Z5189 Encounter for other specified aftercare: Secondary | ICD-10-CM | POA: Insufficient documentation

## 2013-03-23 DIAGNOSIS — I2510 Atherosclerotic heart disease of native coronary artery without angina pectoris: Secondary | ICD-10-CM | POA: Insufficient documentation

## 2013-03-23 DIAGNOSIS — E119 Type 2 diabetes mellitus without complications: Secondary | ICD-10-CM | POA: Insufficient documentation

## 2013-03-23 DIAGNOSIS — I10 Essential (primary) hypertension: Secondary | ICD-10-CM | POA: Insufficient documentation

## 2013-03-23 LAB — HIV ANTIBODY (ROUTINE TESTING W REFLEX)
HIV 1/O/2 Abs-Index Value: <1 (ref <1.00)
HIV-1/HIV-2 Ab: NONREACTIVE

## 2013-03-23 LAB — RPR: RPR: NONREACTIVE

## 2013-03-23 MED ORDER — LEVETIRACETAM 500 MG PO TABS: ORAL_TABLET | ORAL | Status: DC

## 2013-03-23 NOTE — Telephone Encounter (Signed)
I called the patient. The ammonia level is elevated at 122 on the Depakote. The patient will remain on the 500 mg dosing of Depakote for another week, I'll get her started on Keppra at 200 mg twice a day for one week, then go to 500 mg twice a day. After one week of overlap, the patient is to discontinue the Depakote. The patient will remain on Keppra.

## 2013-03-23 NOTE — Progress Notes (Signed)
Pt arrived at cardiac rehab reporting that she had small surgical procedure in Dr. Sheffield Slider office 03/21/13. Pt dsg dry and intact. Pt reports sutures present beneath dressing. Pt reports no activity restrictions.  None noted in Dr. Jerelyn Charles office note. Pt instructed to avoid use of arms and upper body for exercise until external sutures removed.  Pt exercised without difficulty, low level activity.  Pt also reports she has increased her depakote dosage to 500mg  twice daily per Dr. Anne Hahn.  Med list reconciled

## 2013-03-26 ENCOUNTER — Ambulatory Visit (INDEPENDENT_AMBULATORY_CARE_PROVIDER_SITE_OTHER): Payer: BC Managed Care – PPO | Admitting: *Deleted

## 2013-03-26 ENCOUNTER — Encounter (HOSPITAL_COMMUNITY)
Admission: RE | Admit: 2013-03-26 | Discharge: 2013-03-26 | Disposition: A | Discharge status: Home / Self Care | Payer: BC Managed Care – PPO | Source: Ambulatory Visit | Attending: Cardiology | Admitting: Cardiology

## 2013-03-26 ENCOUNTER — Encounter (HOSPITAL_COMMUNITY): Payer: BC Managed Care – PPO

## 2013-03-26 DIAGNOSIS — Z4802 Encounter for removal of sutures: Secondary | ICD-10-CM

## 2013-03-26 DIAGNOSIS — T8189XD Other complications of procedures, not elsewhere classified, subsequent encounter: Secondary | ICD-10-CM

## 2013-03-26 DIAGNOSIS — Z9889 Other specified postprocedural states: Secondary | ICD-10-CM

## 2013-03-26 DIAGNOSIS — I2510 Atherosclerotic heart disease of native coronary artery without angina pectoris: Secondary | ICD-10-CM

## 2013-03-26 NOTE — Progress Notes (Signed)
[name] returns for one suture removal from an area to the left of her lower sternal incision.  The area is well healed with no signs of infection.  She will return prn.

## 2013-03-28 ENCOUNTER — Ambulatory Visit (INDEPENDENT_AMBULATORY_CARE_PROVIDER_SITE_OTHER): Payer: BC Managed Care – HMO

## 2013-03-28 ENCOUNTER — Encounter (HOSPITAL_COMMUNITY)
Admission: RE | Admit: 2013-03-28 | Discharge: 2013-03-28 | Disposition: A | Discharge status: Home / Self Care | Payer: BC Managed Care – PPO | Source: Ambulatory Visit | Attending: Cardiology | Admitting: Cardiology

## 2013-03-28 ENCOUNTER — Encounter (HOSPITAL_COMMUNITY): Payer: BC Managed Care – PPO

## 2013-03-28 DIAGNOSIS — G40309 Generalized idiopathic epilepsy and epileptic syndromes, not intractable, without status epilepticus: Secondary | ICD-10-CM

## 2013-03-28 DIAGNOSIS — R413 Other amnesia: Secondary | ICD-10-CM

## 2013-03-29 MED ORDER — GADOPENTETATE DIMEGLUMINE 469.01 MG/ML IV SOLN: 13.0000 mL | Freq: Once | INTRAVENOUS | Status: AC | PRN

## 2013-03-30 ENCOUNTER — Encounter (HOSPITAL_COMMUNITY): Payer: BC Managed Care – PPO

## 2013-03-30 ENCOUNTER — Encounter (HOSPITAL_COMMUNITY)
Admission: RE | Admit: 2013-03-30 | Discharge: 2013-03-30 | Disposition: A | Discharge status: Home / Self Care | Payer: BC Managed Care – PPO | Source: Ambulatory Visit | Attending: Cardiology | Admitting: Cardiology

## 2013-04-02 ENCOUNTER — Encounter (HOSPITAL_COMMUNITY): Payer: BC Managed Care – PPO

## 2013-04-02 ENCOUNTER — Encounter (HOSPITAL_COMMUNITY)
Admission: RE | Admit: 2013-04-02 | Discharge: 2013-04-02 | Disposition: A | Discharge status: Home / Self Care | Payer: BC Managed Care – PPO | Source: Ambulatory Visit | Attending: Cardiology | Admitting: Cardiology

## 2013-04-02 ENCOUNTER — Other Ambulatory Visit: Payer: BC Managed Care – HMO | Admitting: Radiology

## 2013-04-04 ENCOUNTER — Encounter (HOSPITAL_COMMUNITY): Payer: BC Managed Care – PPO

## 2013-04-04 ENCOUNTER — Encounter (HOSPITAL_COMMUNITY)
Admission: RE | Admit: 2013-04-04 | Discharge: 2013-04-04 | Disposition: A | Discharge status: Home / Self Care | Payer: BC Managed Care – PPO | Source: Ambulatory Visit | Attending: Cardiology | Admitting: Cardiology

## 2013-04-06 ENCOUNTER — Encounter (HOSPITAL_COMMUNITY): Payer: BC Managed Care – PPO

## 2013-04-06 ENCOUNTER — Other Ambulatory Visit (INDEPENDENT_AMBULATORY_CARE_PROVIDER_SITE_OTHER): Payer: BC Managed Care – HMO

## 2013-04-06 ENCOUNTER — Encounter (HOSPITAL_COMMUNITY)
Admission: RE | Admit: 2013-04-06 | Discharge: 2013-04-06 | Disposition: A | Discharge status: Home / Self Care | Payer: BC Managed Care – PPO | Source: Ambulatory Visit | Attending: Cardiology | Admitting: Cardiology

## 2013-04-06 ENCOUNTER — Other Ambulatory Visit (INDEPENDENT_AMBULATORY_CARE_PROVIDER_SITE_OTHER): Payer: BC Managed Care – HMO | Admitting: Neurology

## 2013-04-06 DIAGNOSIS — G40309 Generalized idiopathic epilepsy and epileptic syndromes, not intractable, without status epilepticus: Secondary | ICD-10-CM

## 2013-04-06 DIAGNOSIS — R413 Other amnesia: Secondary | ICD-10-CM

## 2013-04-06 DIAGNOSIS — Z0289 Encounter for other administrative examinations: Secondary | ICD-10-CM

## 2013-04-06 NOTE — Procedures (Signed)
  History:  Samantha Huffman is a 57 year old patient with a history of seizures since 2011. The patient has had episodes of trembling of the arms, with loss of vision. The patient being evaluated for the seizures.  This is a routine EEG. No skull defects are noted. Medications include Caduet, aspirin, Plavix, Keppra, lisinopril, and metoprolol.  EEG classification: Normal awake and asleep  Description of the recording: The background rhythms of this recording consists of a fairly well modulated medium amplitude background activity of 8 Hz. As the record progresses, the patient initially is in the waking state, but appears to enter the early stage II sleep during the recording, with rudimentary sleep spindles and vertex sharp wave activity seen. During the wakeful state, photic stimulation is performed, and this results in a bilateral and symmetric photic driving response. Hyperventilation was also performed, and this results in a minimal buildup of the background rhythm activities without significant slowing seen. At no time during the recording does there appear to be evidence of spike or spike wave discharges or evidence of focal slowing. EKG monitor shows no evidence of cardiac rhythm abnormalities with a heart rate of 60.  Impression: This is a normal EEG recording in the waking and sleeping state. No evidence of ictal or interictal discharges were seen at any time during the recording.

## 2013-04-09 ENCOUNTER — Encounter (HOSPITAL_COMMUNITY)
Admission: RE | Admit: 2013-04-09 | Discharge: 2013-04-09 | Disposition: A | Discharge status: Home / Self Care | Payer: BC Managed Care – PPO | Source: Ambulatory Visit | Attending: Cardiology | Admitting: Cardiology

## 2013-04-09 ENCOUNTER — Encounter (HOSPITAL_COMMUNITY): Payer: BC Managed Care – PPO

## 2013-04-09 ENCOUNTER — Telehealth: Payer: Self-pay

## 2013-04-09 NOTE — Telephone Encounter (Signed)
I called the patient and spoke with ehr daughter. I gave her the results of her EEG. SHe also requested I review her MRI. I did that and advised her that this was reviewed by Dr. Marjory Lies whose findings may represent mild chronic small vessel ischemic disease, but are non-specific and of doubtful clinical significance.   Daughter is concerned that her mother has had quite a bit of memory loss and this concerns her. She asked me to review most recent labs and wanted to know about B12 and what that means. I discussed the use of and effects of B12. I let her know that I did not see that a B12 was drawn. She asked if there was any medication that could be given to help her mother and stated that there is a long family history of Alzheimer's. I recommended that daughter track and log the symptoms the patient is having and bring those in when she sees Ms. Daphine Deutscher, NP on December 2 at 9:30 a.m.

## 2013-04-09 NOTE — Telephone Encounter (Signed)
Message copied by [facility] on Mon Apr 09, 2013  8:31 AM ------      Message from: Stephanie Acre      Created: Fri Apr 06, 2013  5:03 PM       EEG is normal, please call the patient.      ----- Message -----         From: York Spaniel, MD         Sent: 04/06/2013   5:01 PM           To: York Spaniel, MD                   ------

## 2013-04-11 ENCOUNTER — Encounter (HOSPITAL_COMMUNITY): Admission: RE | Admit: 2013-04-11 | Payer: BC Managed Care – PPO | Source: Ambulatory Visit

## 2013-04-11 ENCOUNTER — Encounter (HOSPITAL_COMMUNITY)
Admission: RE | Admit: 2013-04-11 | Discharge: 2013-04-11 | Disposition: A | Discharge status: Home / Self Care | Payer: BC Managed Care – PPO | Source: Ambulatory Visit | Attending: Cardiology | Admitting: Cardiology

## 2013-04-11 ENCOUNTER — Encounter (HOSPITAL_COMMUNITY): Payer: Self-pay

## 2013-04-11 NOTE — Progress Notes (Signed)
Pt graduated from cardiac rehab program today.  Medication list reconciled.   Pt has made significant lifestyle changes and should be commended for her success. Pt plans to continue exercising on her own.   PSYCHOSOCIAL ASSESSMENT PHQ-1 Pt psychosocial assessment reveals no barriers to exercise participation.  Pt quality of life is slightly altered by her physical constraints which limits her ability to perform tasks as prior to her illness. Pt especially struggles with her ability to care for her self in [place].  Pt is currently living with her daughter which is beneficial however it does not offer her privacy and independence.  Pt plans to return to [place] to fulfill obligations and then plans to move to [place].  Pt overall outlook seems to have improved and pt feels more confident in her physical health and abilities including self care.  Her overall quality of life has improved exhibited by her self reflection assessment. Pt has had positive interactions with her classmates and frequently smiles, laughs and dances during exercise.  Pt exhibits positive coping skills and has supportive family.

## 2013-04-13 ENCOUNTER — Encounter (HOSPITAL_COMMUNITY): Payer: BC Managed Care – PPO

## 2013-04-16 ENCOUNTER — Encounter (HOSPITAL_COMMUNITY): Payer: BC Managed Care – PPO

## 2013-04-18 ENCOUNTER — Encounter (HOSPITAL_COMMUNITY): Payer: BC Managed Care – PPO

## 2013-04-20 ENCOUNTER — Encounter (HOSPITAL_COMMUNITY): Payer: BC Managed Care – PPO

## 2013-04-23 ENCOUNTER — Encounter (HOSPITAL_COMMUNITY): Payer: BC Managed Care – PPO

## 2013-04-25 ENCOUNTER — Encounter (HOSPITAL_COMMUNITY): Payer: BC Managed Care – PPO

## 2013-04-27 ENCOUNTER — Encounter (HOSPITAL_COMMUNITY): Payer: BC Managed Care – PPO

## 2013-04-30 ENCOUNTER — Encounter (HOSPITAL_COMMUNITY): Payer: BC Managed Care – PPO

## 2013-05-02 ENCOUNTER — Encounter (HOSPITAL_COMMUNITY): Payer: BC Managed Care – PPO

## 2013-05-04 ENCOUNTER — Encounter (HOSPITAL_COMMUNITY): Payer: BC Managed Care – PPO

## 2013-05-07 ENCOUNTER — Encounter (HOSPITAL_COMMUNITY): Payer: BC Managed Care – PPO

## 2013-05-09 ENCOUNTER — Encounter (HOSPITAL_COMMUNITY): Payer: BC Managed Care – PPO

## 2013-05-11 ENCOUNTER — Encounter (HOSPITAL_COMMUNITY): Payer: BC Managed Care – PPO

## 2013-05-14 ENCOUNTER — Encounter (HOSPITAL_COMMUNITY): Payer: BC Managed Care – PPO

## 2013-05-16 ENCOUNTER — Encounter (HOSPITAL_COMMUNITY): Payer: BC Managed Care – PPO

## 2013-05-18 ENCOUNTER — Encounter (HOSPITAL_COMMUNITY): Payer: BC Managed Care – PPO

## 2013-07-24 ENCOUNTER — Ambulatory Visit: Payer: BC Managed Care – HMO | Admitting: Nurse Practitioner

## 2013-08-20 ENCOUNTER — Other Ambulatory Visit: Payer: Self-pay | Admitting: Cardiology

## 2013-08-21 ENCOUNTER — Encounter: Payer: Self-pay | Admitting: *Deleted

## 2013-08-21 ENCOUNTER — Encounter: Payer: Self-pay | Admitting: Cardiology

## 2013-08-21 DIAGNOSIS — R569 Unspecified convulsions: Secondary | ICD-10-CM | POA: Insufficient documentation

## 2013-08-21 DIAGNOSIS — E785 Hyperlipidemia, unspecified: Secondary | ICD-10-CM | POA: Insufficient documentation

## 2013-08-21 DIAGNOSIS — E119 Type 2 diabetes mellitus without complications: Secondary | ICD-10-CM | POA: Insufficient documentation

## 2013-08-22 ENCOUNTER — Encounter: Payer: Self-pay | Admitting: General Surgery

## 2013-08-22 DIAGNOSIS — R569 Unspecified convulsions: Secondary | ICD-10-CM

## 2013-08-22 DIAGNOSIS — R079 Chest pain, unspecified: Secondary | ICD-10-CM | POA: Insufficient documentation

## 2013-08-22 DIAGNOSIS — I10 Essential (primary) hypertension: Secondary | ICD-10-CM | POA: Insufficient documentation

## 2013-08-22 DIAGNOSIS — I2510 Atherosclerotic heart disease of native coronary artery without angina pectoris: Secondary | ICD-10-CM | POA: Insufficient documentation

## 2013-09-12 ENCOUNTER — Ambulatory Visit: Payer: BC Managed Care – PPO | Admitting: Cardiology

## 2015-04-25 DIAGNOSIS — R5383 Other fatigue: Secondary | ICD-10-CM | POA: Insufficient documentation

## 2015-04-25 DIAGNOSIS — E785 Hyperlipidemia, unspecified: Secondary | ICD-10-CM | POA: Insufficient documentation

## 2015-07-16 ENCOUNTER — Ambulatory Visit (INDEPENDENT_AMBULATORY_CARE_PROVIDER_SITE_OTHER): Payer: Medicare HMO | Admitting: Cardiology

## 2015-07-16 ENCOUNTER — Encounter: Payer: Self-pay | Admitting: Cardiology

## 2015-07-16 VITALS — BP 164/82 | HR 76 | Ht 62.5 in | Wt 147.0 lb

## 2015-07-16 DIAGNOSIS — I6529 Occlusion and stenosis of unspecified carotid artery: Secondary | ICD-10-CM

## 2015-07-16 DIAGNOSIS — I739 Peripheral vascular disease, unspecified: Secondary | ICD-10-CM | POA: Diagnosis not present

## 2015-07-16 DIAGNOSIS — I2510 Atherosclerotic heart disease of native coronary artery without angina pectoris: Secondary | ICD-10-CM | POA: Diagnosis not present

## 2015-07-16 DIAGNOSIS — E785 Hyperlipidemia, unspecified: Secondary | ICD-10-CM | POA: Diagnosis not present

## 2015-07-16 DIAGNOSIS — I10 Essential (primary) hypertension: Secondary | ICD-10-CM

## 2015-07-16 MED ORDER — VARENICLINE TARTRATE 1 MG PO TABS: 1.0000 mg | ORAL_TABLET | Freq: Two times a day (BID) | ORAL | Status: DC

## 2015-07-16 MED ORDER — VARENICLINE TARTRATE 0.5 MG X 11 & 1 MG X 42 PO MISC
ORAL | Status: DC
Start: 2015-07-16 — End: 2015-09-15

## 2015-07-16 NOTE — Patient Instructions (Signed)
Medication Instructions:  Your physician has recommended you make the following change in your medication:  1) TAKE CHANTIX AS DIRECTED ON PACKAGING   Labwork: None  Testing/Procedures: Your physician has requested that you have a lower extremity arterial duplex. During this test, ultrasound is used to evaluate arterial blood flow in the legs. Allow one hour for this exam. There are no restrictions or special instructions.   Your physician has requested that you have a carotid duplex. This test is an ultrasound of the carotid arteries in your neck. It looks at blood flow through these arteries that supply the brain with blood. Allow one hour for this exam. There are no restrictions or special instructions.  Follow-Up: Your physician wants you to follow-up in: 1 year with Dr. Radford Pax. You will receive a reminder letter in the mail two months in advance. If you don't receive a letter, please call our office to schedule the follow-up appointment.   Any Other Special Instructions Will Be Listed Below (If Applicable).     If you need a refill on your cardiac medications before your next appointment, please call your pharmacy.

## 2015-07-16 NOTE — Progress Notes (Signed)
Cardiology Office Note   Date:  07/16/2015   ID:  Mariatheresa Dralle, DOB [birth year], MRN [id number]  PCP:  Donnie Coffin, MD    Chief Complaint  Patient presents with  . Hypertension  . Coronary Artery Disease      History of Present Illness: Samantha Huffman is a 59 y.o. female who presents for followup of CAD.  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  She is doing well.  She denies any chest pain, SOB, DOE,  palpitations, PND, orthopnea, claudication or syncope.  She occasionally has some right ankle and foot swelling.  She does get fatigued some.  Occasionally she will have some lightheadedness if she gets up too fast.      Past Medical History  Diagnosis Date  . S/P CABG x 2   . Hypertension   . Diabetes mellitus without complication ([place])   . Seizures ([place])   . Generalized convulsive epilepsy without mention of intractable epilepsy   . Memory deficit   . Dyslipidemia   . Hypercholesteremia   . Carotid artery stenosis     (LEFT) 50-70% Right side is less then 50%. ICA stenosis  . Coronary artery disease     70-80% LM ostium,60% ostial LAD,40%D1,40% mid RCA. Low normal LVF EF 50-55% s/p CABG    Past Surgical History  Procedure Laterality Date  . Coronary artery bypass graft  March, 2014    2 vessel  . Abdominal hysterectomy    . Cesarean section       Current Outpatient Prescriptions  Medication Sig Dispense Refill  . amlodipine-atorvastatin (CADUET) 5-80 MG per tablet TAKE 1 TABLET BY MOUTH ONCE DAILY 30 tablet 1  . aspirin 81 MG tablet Take 81 mg by mouth daily.    . clopidogrel (PLAVIX) 75 MG tablet Take 75 mg by mouth daily.    . divalproex (DEPAKOTE ER) 500 MG 24 hr tablet Take 500 mg by mouth daily.    Marland Kitchen escitalopram (LEXAPRO) 10 MG tablet Take 10 mg by mouth daily.    Marland Kitchen levETIRAcetam (KEPPRA) 500 MG tablet One half tablet twice daily for one week, then take one full tablet twice daily 60  tablet 5  . lisinopril (PRINIVIL,ZESTRIL) 10 MG tablet Take 10 mg by mouth daily.    . metoprolol succinate (TOPROL-XL) 25 MG 24 hr tablet Take 25 mg by mouth daily.    . varenicline (CHANTIX CONTINUING MONTH PAK) 1 MG tablet Take 1 tablet (1 mg total) by mouth 2 (two) times daily. 60 tablet 1  . varenicline (CHANTIX STARTING MONTH PAK) 0.5 MG X 11 & 1 MG X 42 tablet Take one 0.5 mg tablet by mouth once daily for 3 days, then increase to one 0.5 mg tablet twice daily for 4 days, then increase to one 1 mg tablet twice daily. 53 tablet 0   No current facility-administered medications for this visit.    Allergies:   Review of patient's allergies indicates no known allergies.    Social History:  The patient  reports that she quit smoking about 2 years ago. Her smoking use included Cigarettes. She has a 20 pack-year smoking history. She quit smokeless tobacco use about 2 years ago. She reports that she drinks alcohol. She reports that she does not use illicit drugs.   Family History:  The patient's family history includes Cancer in  her father; Diabetes in her brother, brother, and father; Heart disease in her brother; Stroke in her brother.    ROS:  Please see the history of present illness.   Otherwise, review of systems are positive for none.   All other systems are reviewed and negative.    PHYSICAL EXAM: VS:  BP 164/82 mmHg  Pulse 76  Ht 5' 2.5" (1.588 m)  Wt 66.679 kg (147 lb)  BMI 26.44 kg/m2 , BMI Body mass index is 26.44 kg/(m^2). GEN: Well nourished, well developed, in no acute distress HEENT: normal Neck: no JVD, carotid bruits, or masses Cardiac: RRR; no murmurs, rubs, or gallops,no edema.  Trace pulses in LE bilaterally Respiratory:  clear to auscultation bilaterally, normal work of breathing GI: soft, nontender, nondistended, + BS MS: no deformity or atrophy Skin: warm and dry, no rash Neuro:  Strength and sensation are intact Psych: euthymic mood, full affect   EKG:   EKG was ordered today. The ekg ordered today demonstrates NSR with PVC's, rSR' and nonspecific T wave abnormality   Recent Labs: No results found for requested labs within last 365 days.    Lipid Panel No results found for: CHOL, TRIG, HDL, CHOLHDL, VLDL, LDLCALC, LDLDIRECT    Wt Readings from Last 3 Encounters:  07/16/15 66.679 kg (147 lb)  03/16/13 62.506 kg (137 lb 12.8 oz)  03/22/13 64.411 kg (142 lb)        ASSESSMENT AND PLAN:  1.  ASCAD s/p CABG with no angina.  Continue ASA/Plavix. 2.  HTN - borderline controlled.   I have asked her to check her BP daily for a week and call with the results.  Continue Caduet/BB/ACE I 3.  Dyslipidemia - continue statin.  I will get a copy of last FLP and ALT from PCP. 4.  Bilateral carotid artery stenosis.  Check carotid arterial dopplers.  Continue ASA/Plavix. 5.  Claudications symptoms with reduced LE pulses - I will get LE arterial dopplers. 6.  Ongoing tobacco abuse - she smoke 1 pack every 2-3 days.  She has tried Chantix and it helped in the past so I will prescribe the starter pack for 1 month and then maintenance for 2 months.  She was counseled on trying to get off cigarettes by the end of the second week.    Current medicines are reviewed at length with the patient today.  The patient does not have concerns regarding medicines.  The following changes have been made:  Chantix starter pack x 1 month then maintenance pack for 2 months  Labs/ tests ordered today: See above Assessment and Plan  Orders Placed This Encounter  Procedures  . EKG 12-Lead     Disposition:   FU with me in 1 year  Signed, Sueanne Margarita, MD  07/16/2015 10:24 AM    [facility] [place], [place], [place]  [address] Phone: [phone]; Fax: [phone]

## 2015-07-24 ENCOUNTER — Telehealth: Payer: Self-pay | Admitting: Cardiology

## 2015-07-24 NOTE — Telephone Encounter (Signed)
Records rec from [place] placed in chart prep bin.

## 2015-07-30 ENCOUNTER — Encounter: Payer: Self-pay | Admitting: Cardiology

## 2015-07-30 ENCOUNTER — Telehealth: Payer: Self-pay | Admitting: Cardiology

## 2015-07-30 DIAGNOSIS — N200 Calculus of kidney: Secondary | ICD-10-CM

## 2015-07-30 DIAGNOSIS — R55 Syncope and collapse: Secondary | ICD-10-CM | POA: Insufficient documentation

## 2015-07-30 DIAGNOSIS — E785 Hyperlipidemia, unspecified: Secondary | ICD-10-CM

## 2015-07-30 DIAGNOSIS — I6529 Occlusion and stenosis of unspecified carotid artery: Secondary | ICD-10-CM | POA: Insufficient documentation

## 2015-07-30 DIAGNOSIS — G4733 Obstructive sleep apnea (adult) (pediatric): Secondary | ICD-10-CM

## 2015-07-30 DIAGNOSIS — I272 Pulmonary hypertension, unspecified: Secondary | ICD-10-CM | POA: Insufficient documentation

## 2015-07-30 HISTORY — DX: Syncope and collapse: R55

## 2015-07-30 HISTORY — DX: Calculus of kidney: N20.0

## 2015-07-30 HISTORY — DX: Obstructive sleep apnea (adult) (pediatric): G47.33

## 2015-07-30 NOTE — Telephone Encounter (Signed)
Reviewed old records and last LDL was not at goal of < 70 (84) in Sept 2016.  Please have patient come in for repeat FLP and ALT>

## 2015-07-31 NOTE — Telephone Encounter (Signed)
FLP and ALT scheduled for 12/14. Patient agrees with treatment plan.

## 2015-07-31 NOTE — Addendum Note (Signed)
Addended by: Harland German A on: 07/31/2015 03:48 PM   Modules accepted: Orders

## 2015-08-04 ENCOUNTER — Telehealth: Payer: Self-pay

## 2015-08-04 NOTE — Telephone Encounter (Signed)
Prior auth for Chantix starter

## 2015-08-04 NOTE — Telephone Encounter (Signed)
Prior auth for Chantix starter pack and continuing pack received. PA # [id number]. Faxed to [facility].

## 2015-08-05 ENCOUNTER — Other Ambulatory Visit: Payer: Self-pay | Admitting: Cardiology

## 2015-08-05 ENCOUNTER — Ambulatory Visit (HOSPITAL_COMMUNITY)
Admission: RE | Admit: 2015-08-05 | Discharge: 2015-08-05 | Disposition: A | Discharge status: Home / Self Care | Payer: Medicare HMO | Source: Ambulatory Visit | Attending: Cardiology | Admitting: Cardiology

## 2015-08-05 DIAGNOSIS — E785 Hyperlipidemia, unspecified: Secondary | ICD-10-CM | POA: Diagnosis not present

## 2015-08-05 DIAGNOSIS — I10 Essential (primary) hypertension: Secondary | ICD-10-CM | POA: Diagnosis not present

## 2015-08-05 DIAGNOSIS — E7800 Pure hypercholesterolemia, unspecified: Secondary | ICD-10-CM | POA: Diagnosis not present

## 2015-08-05 DIAGNOSIS — E119 Type 2 diabetes mellitus without complications: Secondary | ICD-10-CM | POA: Insufficient documentation

## 2015-08-05 DIAGNOSIS — I6523 Occlusion and stenosis of bilateral carotid arteries: Secondary | ICD-10-CM | POA: Diagnosis not present

## 2015-08-05 DIAGNOSIS — I739 Peripheral vascular disease, unspecified: Secondary | ICD-10-CM

## 2015-08-05 DIAGNOSIS — I272 Other secondary pulmonary hypertension: Secondary | ICD-10-CM | POA: Diagnosis not present

## 2015-08-05 DIAGNOSIS — I6529 Occlusion and stenosis of unspecified carotid artery: Secondary | ICD-10-CM

## 2015-08-06 ENCOUNTER — Other Ambulatory Visit (INDEPENDENT_AMBULATORY_CARE_PROVIDER_SITE_OTHER): Payer: Medicare HMO | Admitting: *Deleted

## 2015-08-06 DIAGNOSIS — E785 Hyperlipidemia, unspecified: Secondary | ICD-10-CM

## 2015-08-06 LAB — ALT: ALT: 17 U/L (ref 6–29)

## 2015-08-06 LAB — LIPID PANEL
CHOL/HDL RATIO: 3.1 ratio (ref <=5.0)
Cholesterol: 136 mg/dL (ref 125–200)
HDL: 44 mg/dL — ABNORMAL LOW (ref >=46)
LDL Cholesterol: 68 mg/dL (ref <130)
Triglycerides: 118 mg/dL (ref <150)
VLDL: 24 mg/dL (ref <30)

## 2015-08-07 ENCOUNTER — Telehealth: Payer: Self-pay

## 2015-08-07 ENCOUNTER — Encounter: Payer: Self-pay | Admitting: Cardiology

## 2015-08-07 DIAGNOSIS — I6523 Occlusion and stenosis of bilateral carotid arteries: Secondary | ICD-10-CM

## 2015-08-07 NOTE — Telephone Encounter (Signed)
New Message: ° ° ° ° °Pt is calling for test results  °

## 2015-08-07 NOTE — Telephone Encounter (Signed)
Informed patient of results and verbal understanding expressed.  Repeat carotid ordered to be scheduled in 2 years. Patient agrees with treatment plan.

## 2015-08-07 NOTE — Telephone Encounter (Signed)
-----   Message from Sueanne Margarita, MD sent at 08/06/2015 11:12 AM EST ----- 1-39% bilateral carotid artery stenosis - repeat study in 2 years

## 2015-08-07 NOTE — Telephone Encounter (Signed)
This encounter was created in error - please disregard.

## 2015-08-29 ENCOUNTER — Other Ambulatory Visit: Payer: Self-pay | Admitting: Cardiology

## 2015-09-11 DIAGNOSIS — N200 Calculus of kidney: Secondary | ICD-10-CM | POA: Diagnosis not present

## 2015-09-15 ENCOUNTER — Ambulatory Visit (INDEPENDENT_AMBULATORY_CARE_PROVIDER_SITE_OTHER): Payer: Medicare HMO | Admitting: Neurology

## 2015-09-15 ENCOUNTER — Encounter: Payer: Self-pay | Admitting: Neurology

## 2015-09-15 VITALS — BP 169/79 | HR 68 | Ht 61.0 in | Wt 149.5 lb

## 2015-09-15 DIAGNOSIS — G441 Vascular headache, not elsewhere classified: Secondary | ICD-10-CM

## 2015-09-15 DIAGNOSIS — R51 Headache: Secondary | ICD-10-CM

## 2015-09-15 DIAGNOSIS — R519 Headache, unspecified: Secondary | ICD-10-CM | POA: Insufficient documentation

## 2015-09-15 DIAGNOSIS — G40309 Generalized idiopathic epilepsy and epileptic syndromes, not intractable, without status epilepticus: Secondary | ICD-10-CM

## 2015-09-15 DIAGNOSIS — Z5181 Encounter for therapeutic drug level monitoring: Secondary | ICD-10-CM

## 2015-09-15 DIAGNOSIS — R413 Other amnesia: Secondary | ICD-10-CM | POA: Diagnosis not present

## 2015-09-15 NOTE — Progress Notes (Signed)
Reason for visit: Seizures  Samantha Huffman is a 60 y.o. female  History of present illness:  Samantha Huffman is a 60 year old right-handed black female with a history of what are termed seizures and headache. The patient was last seen through this office in 2014. She has been living in [place], but she recently moved back to this area to live with her daughter. The patient has been on Depakote taking 500 mg twice daily, she is also on Keppra taking 500 mg twice daily. She indicates that her last seizure-type event was almost a year ago. 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 She indicates that her episodes will generally start with a headache event that begins in the right frontotemporal area, the headache may spread to the left side, and then the patient develops some difficulty with concentration, dizziness, then she may have episode of dimming or loss of vision, stuttering speech, and tremulousness that she is terms a seizure. The patient occasionally will have a syncopal event with the episode. The patient indicates that she may have headaches without the "seizure events". The patient indicates that trying to read or concentrate will bring on an event. She apparently is out of work because of this. She denies any numbness or weakness of the extremities, she denies imbalance issues. She may get dizziness with headache, but no nausea or vomiting. She denies any photophobia or phonophobia with the headache. She has one brother with seizures and another brother with headache. She indicates that when she gets a headache, if she lies down she feels better in several hours. She does not take medication for the headache. She is sent to this office for an evaluation.  Past Medical History  Diagnosis Date  . S/P CABG x 2   . Hypertension   . Diabetes mellitus without complication ([place])   . Seizures ([place])   . Generalized convulsive epilepsy without mention of intractable epilepsy   .  Memory deficit   . Dyslipidemia   . Hypercholesteremia   . Carotid artery stenosis     (LEFT) 50-70% Right side is less then 50%. ICA stenosis  . Coronary artery disease     70-80% LM ostium,60% ostial LAD,40%D1,40% mid RCA. Low normal LVF EF 50-55% s/p CABG  . OSA (obstructive sleep apnea) 07/30/2015  . Nephrolithiasis 07/30/2015    S/p lithotripsy x 3 and right and left ureter stents  . Pulmonary HTN ([place]) 07/30/2015    Mild with PASP 35-61mmHg by echo 0000000 with diastolic dysfunction  . Vasovagal syncope 07/30/2015    2011  . Carotid artery stenosis, asymptomatic 07/30/2015    < 50% bilaterally 10/2014  . Headache 09/15/2015    Past Surgical History  Procedure Laterality Date  . Coronary artery bypass graft  March, 2014    LIMA to LAD, left radial to LCx  . Abdominal hysterectomy      with BSO  . Cesarean section    . Lithotripsy      x 3  . Ureteral stent placement      Family History  Problem Relation Age of Onset  . Cancer Father   . Diabetes Father   . Diabetes Brother   . Stroke Brother   . Diabetes Brother   . Heart disease Brother     CAD with CABG  . Heart disease Sister     CAD with MI  . Heart attack Sister     Social history:  reports that she quit smoking  about 2 years ago. Her smoking use included Cigarettes. She has a 20 pack-year smoking history. She quit smokeless tobacco use about 2 years ago. She reports that she drinks alcohol. She reports that she does not use illicit drugs.  Medications:  Prior to Admission medications   Medication Sig Start Date End Date Taking? Authorizing Provider  amlodipine-atorvastatin (CADUET) 5-80 MG per tablet TAKE 1 TABLET BY MOUTH ONCE DAILY 08/20/13  Yes Sueanne Margarita, MD  aspirin 81 MG tablet Take 81 mg by mouth daily.   Yes Historical Provider, MD  clopidogrel (PLAVIX) 75 MG tablet TAKE ONE TABLET BY MOUTH DAILY 08/29/15  Yes Sueanne Margarita, MD  divalproex (DEPAKOTE ER) 500 MG 24 hr tablet Take 500 mg by mouth daily.    Yes Historical Provider, MD  escitalopram (LEXAPRO) 10 MG tablet Take 10 mg by mouth daily.   Yes Historical Provider, MD  levETIRAcetam (KEPPRA) 500 MG tablet One half tablet twice daily for one week, then take one full tablet twice daily 03/23/13  Yes Kathrynn Ducking, MD  lisinopril (PRINIVIL,ZESTRIL) 10 MG tablet Take 10 mg by mouth daily.   Yes Historical Provider, MD  metoprolol succinate (TOPROL-XL) 25 MG 24 hr tablet Take 25 mg by mouth daily.   Yes Historical Provider, MD  varenicline (CHANTIX CONTINUING MONTH PAK) 1 MG tablet Take 1 tablet (1 mg total) by mouth 2 (two) times daily. 07/16/15  Yes Sueanne Margarita, MD     No Known Allergies  ROS:  Out of a complete 14 system review of symptoms, the patient complains only of the following symptoms, and all other reviewed systems are negative.  Excessive sweating Light sensitivity, blurred vision Shortness of breath Snoring Bruising easily Memory loss, headache, seizures, speech difficulty Decreased concentration, depression  Blood pressure 169/79, pulse 68, height 5\' 1"  (1.549 m), weight 149 lb 8 oz (67.813 kg).  Physical Exam  General: The patient is alert and cooperative at the time of the examination.  Eyes: Pupils are equal, round, and reactive to light. Discs are flat bilaterally.  Neck: The neck is supple, no carotid bruits are noted.  Respiratory: The respiratory examination is clear.  Cardiovascular: The cardiovascular examination reveals a regular rate and rhythm, no obvious murmurs or rubs are noted.  Skin: Extremities are without significant edema.  Neurologic Exam  Mental status: The patient is alert and oriented x 3 at the time of the examination. The patient has apparent normal recent and remote memory, with an apparently normal attention span and concentration ability.  Cranial nerves: Facial symmetry is present. There is good sensation of the face to pinprick and soft touch bilaterally. The strength of the  facial muscles and the muscles to head turning and shoulder shrug are normal bilaterally. Speech is well enunciated, no aphasia or dysarthria is noted. Extraocular movements are full. Visual fields are full. The tongue is midline, and the patient has symmetric elevation of the soft palate. No obvious hearing deficits are noted.  Motor: The motor testing reveals 5 over 5 strength of all 4 extremities. Good symmetric motor tone is noted throughout.  Sensory: Sensory testing is intact to pinprick, soft touch, vibration sensation, and position sense on all 4 extremities. No evidence of extinction is noted.  Coordination: Cerebellar testing reveals good finger-nose-finger and heel-to-shin bilaterally.  Gait and station: Gait is normal. Tandem gait is normal. Romberg is negative. No drift is seen.  Reflexes: Deep tendon reflexes are symmetric and normal bilaterally. Toes are downgoing  bilaterally.   MRI brain 03/29/13:  IMPRESSION:  Equivocal MRI brain (with and without) demonstrating: 1. Trivial foci of non-specific gliosis in the bilateral centrum semiovale.  2. Single punctate mineralization or hemosiderin deposition in the left postero-lateral pons. 3. Above findings are may represent mild chronic small vessel ischemic disease, but are non-specific and of doubtful clinical significance.    Assessment/Plan:  1. History of "seizures"  2. Episodic headache  The patient reports a pervasive problem with decreased concentration. When last seen, her Depakote level was in the low therapeutic range, but her ammonia level was significantly elevated. The patient may need to come off of Depakote for this reason. I am not sure that the patient actually has seizures. The episodes appear to be more consistent with migraine events. We may consider a switch to Topamax or Zonegran in the future, and get the patient off of Depakote and Keppra. If needed in the future, a video EEG monitoring study may be of  benefit. A prior EEG study done through this office was normal. MRI of the brain showed very minimal small vessel changes. She will follow-up in 3-4 months.  Jill Alexanders MD 09/15/2015 10:24 AM  [facility] [address] [place], [place] [address]  Phone [phone] Fax [phone]

## 2015-09-15 NOTE — Patient Instructions (Signed)

## 2015-09-16 ENCOUNTER — Telehealth: Payer: Self-pay | Admitting: Neurology

## 2015-09-16 LAB — VALPROIC ACID LEVEL: VALPROIC ACID LVL: 18 ug/mL — AB (ref 50–100)

## 2015-09-16 LAB — COMPREHENSIVE METABOLIC PANEL
A/G RATIO: 1.5 (ref 1.1–2.5)
ALBUMIN: 4.4 g/dL (ref 3.5–5.5)
ALK PHOS: 121 IU/L — AB (ref 39–117)
ALT: 19 IU/L (ref 0–32)
AST: 18 IU/L (ref 0–40)
BILIRUBIN TOTAL: 0.3 mg/dL (ref 0.0–1.2)
BUN / CREAT RATIO: 13 (ref 9–23)
BUN: 10 mg/dL (ref 6–24)
CHLORIDE: 98 mmol/L (ref 96–106)
CO2: 23 mmol/L (ref 18–29)
Calcium: 9.4 mg/dL (ref 8.7–10.2)
Creatinine, Ser: 0.76 mg/dL (ref 0.57–1.00)
GFR calc Af Amer: 99 mL/min/{1.73_m2} (ref >59)
GFR calc non Af Amer: 86 mL/min/{1.73_m2} (ref >59)
GLOBULIN, TOTAL: 3 g/dL (ref 1.5–4.5)
GLUCOSE: 100 mg/dL — AB (ref 65–99)
POTASSIUM: 4.7 mmol/L (ref 3.5–5.2)
SODIUM: 138 mmol/L (ref 134–144)
Total Protein: 7.4 g/dL (ref 6.0–8.5)

## 2015-09-16 LAB — CBC WITH DIFFERENTIAL/PLATELET
BASOS ABS: 0 10*3/uL (ref 0.0–0.2)
Basos: 0 %
EOS (ABSOLUTE): 0.2 10*3/uL (ref 0.0–0.4)
Eos: 3 %
HEMATOCRIT: 40.7 % (ref 34.0–46.6)
Hemoglobin: 13.6 g/dL (ref 11.1–15.9)
Immature Grans (Abs): 0 10*3/uL (ref 0.0–0.1)
Immature Granulocytes: 0 %
LYMPHS ABS: 1.7 10*3/uL (ref 0.7–3.1)
Lymphs: 34 %
MCH: 31 pg (ref 26.6–33.0)
MCHC: 33.4 g/dL (ref 31.5–35.7)
MCV: 93 fL (ref 79–97)
MONOS ABS: 0.4 10*3/uL (ref 0.1–0.9)
Monocytes: 7 %
Neutrophils Absolute: 2.8 10*3/uL (ref 1.4–7.0)
Neutrophils: 56 %
Platelets: 208 10*3/uL (ref 150–379)
RBC: 4.39 x10E6/uL (ref 3.77–5.28)
RDW: 14.3 % (ref 12.3–15.4)
WBC: 5.1 10*3/uL (ref 3.4–10.8)

## 2015-09-16 LAB — AMMONIA: AMMONIA: 108 ug/dL — AB (ref 19–87)

## 2015-09-16 MED ORDER — TOPIRAMATE 25 MG PO TABS: ORAL_TABLET | ORAL | Status: DC

## 2015-09-16 NOTE — Telephone Encounter (Signed)
I called the patient. The blood work shows a minimal elevation in alkaline phosphatase, but the ammonia level remains elevated even on 500 mg daily of the Depakote. The patient will stop the Depakote, I will start her on Topamax, eventually may come off of Keppra as well.

## 2015-09-23 ENCOUNTER — Telehealth: Payer: Self-pay | Admitting: Neurology

## 2015-09-23 DIAGNOSIS — I2510 Atherosclerotic heart disease of native coronary artery without angina pectoris: Secondary | ICD-10-CM | POA: Diagnosis not present

## 2015-09-23 DIAGNOSIS — Z1211 Encounter for screening for malignant neoplasm of colon: Secondary | ICD-10-CM | POA: Diagnosis not present

## 2015-09-23 DIAGNOSIS — E7800 Pure hypercholesterolemia, unspecified: Secondary | ICD-10-CM | POA: Diagnosis not present

## 2015-09-23 DIAGNOSIS — Z23 Encounter for immunization: Secondary | ICD-10-CM | POA: Diagnosis not present

## 2015-09-23 DIAGNOSIS — M79604 Pain in right leg: Secondary | ICD-10-CM | POA: Diagnosis not present

## 2015-09-23 DIAGNOSIS — I10 Essential (primary) hypertension: Secondary | ICD-10-CM | POA: Diagnosis not present

## 2015-09-23 DIAGNOSIS — Z0000 Encounter for general adult medical examination without abnormal findings: Secondary | ICD-10-CM | POA: Diagnosis not present

## 2015-09-23 DIAGNOSIS — R69 Illness, unspecified: Secondary | ICD-10-CM | POA: Diagnosis not present

## 2015-09-23 DIAGNOSIS — R7309 Other abnormal glucose: Secondary | ICD-10-CM | POA: Diagnosis not present

## 2015-09-23 NOTE — Telephone Encounter (Signed)
I have received extensive medical records from her prior neurologist in [place]. 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 The patient also has migraine headache and depression. She was treated with Keppra and Depakote. The patient has had an EEG evaluation that showed central sharp and spike waves. Again she has been treated for episodes of aphasia felt secondary to seizures.  MRI of the brain has been done, shows minimal white matter changes, otherwise unremarkable. Study was done on 06/08/2015. The patient also had an MRI of the brain done on 02/14/2010 with MRA of the head. MRI and MRA of the head were unremarkable. The patient has had episodes of speech alteration since 2011. EEG study done in August 2012 was normal. An EEG study done in November 2012 was normal. EEG done June 2011 was abnormal showing left temporal sharp wave activity.

## 2015-09-30 IMAGING — CR DG HUMERUS 2V *L*
2 series · 2 of 2 positions shown · non-contrast
Comparison: No prior .

CLINICAL DATA: Pain.  No injury.

EXAM:
LEFT HUMERUS - 2+ VIEW

[w humerus ap left * (1 of 2)]
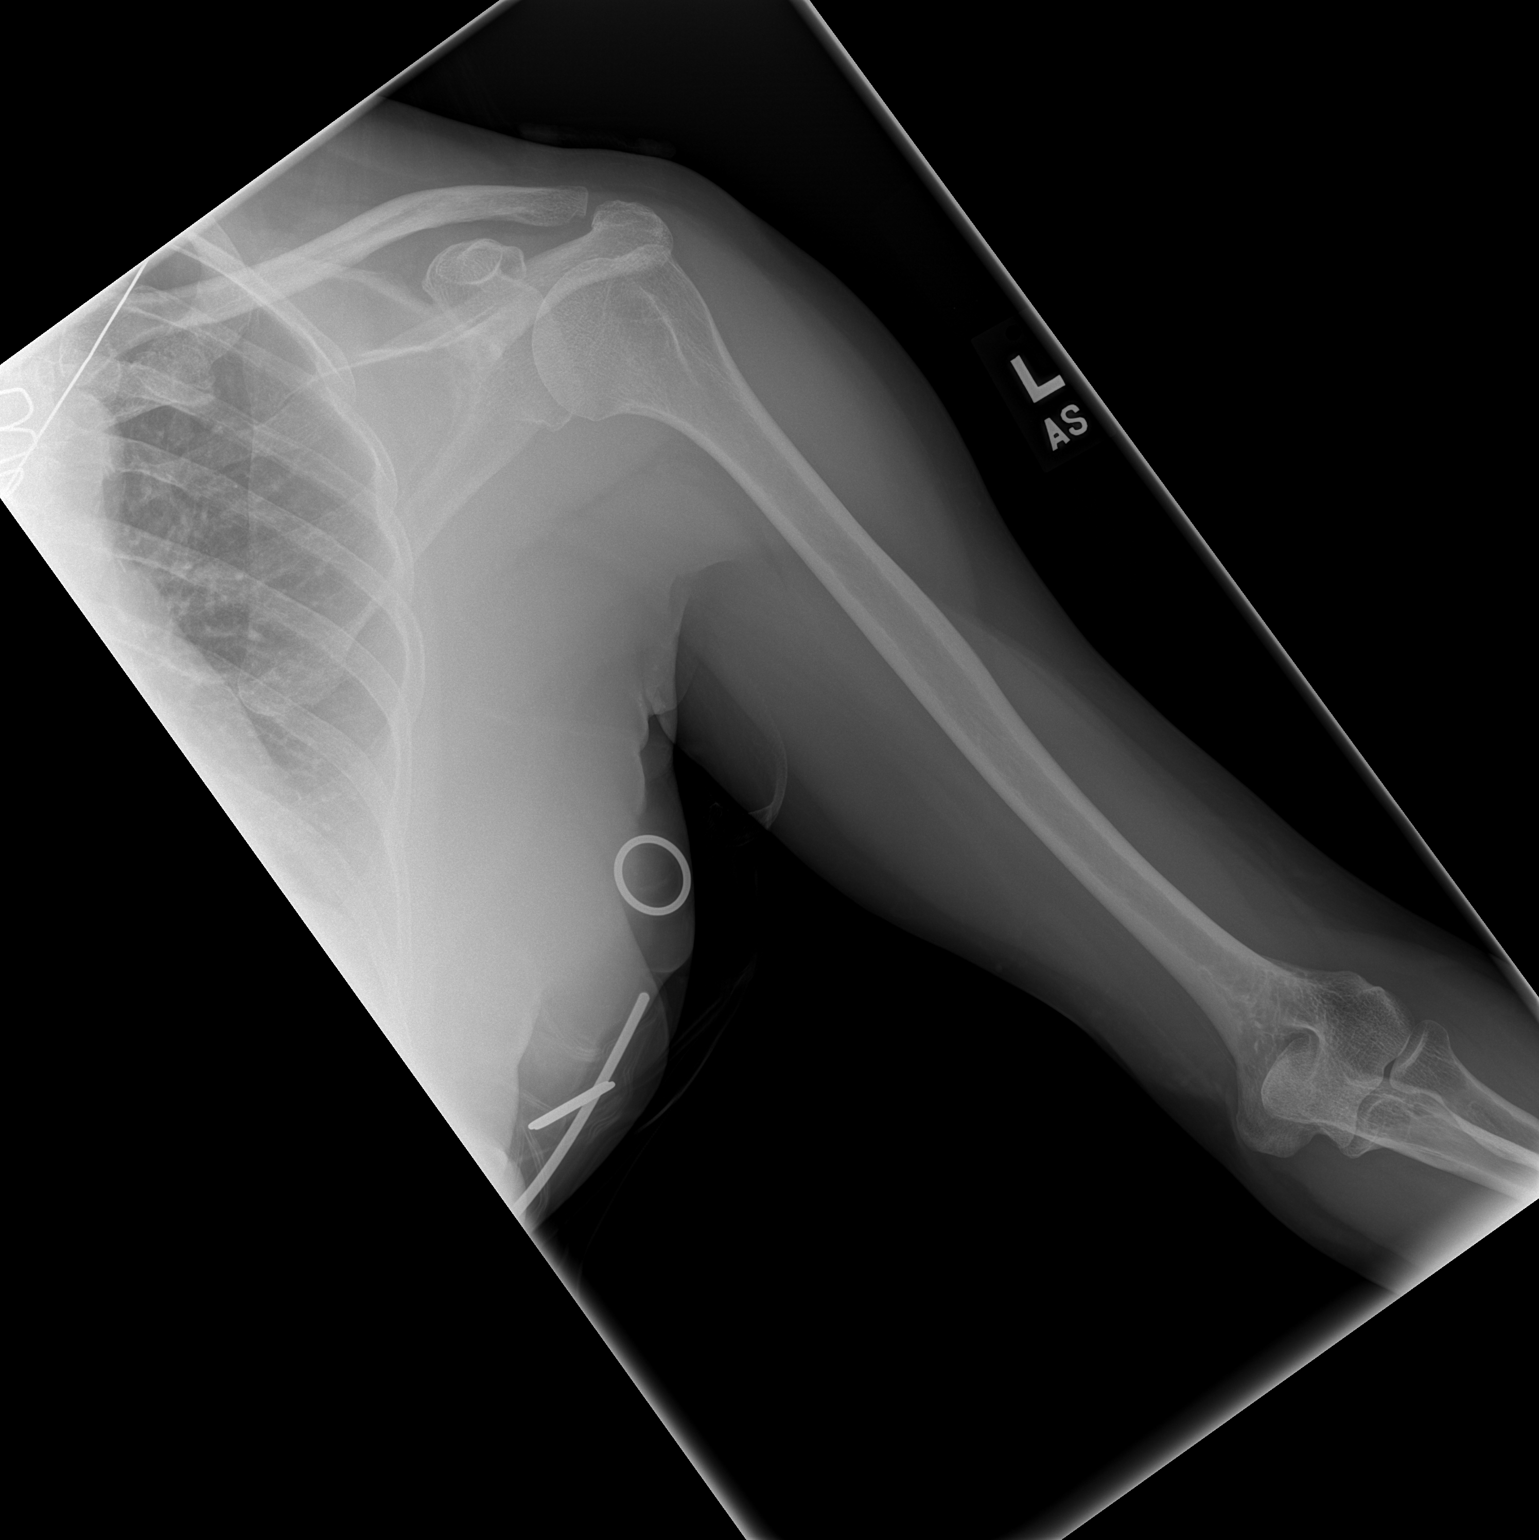

[w humerus ap left * (2 of 2)]
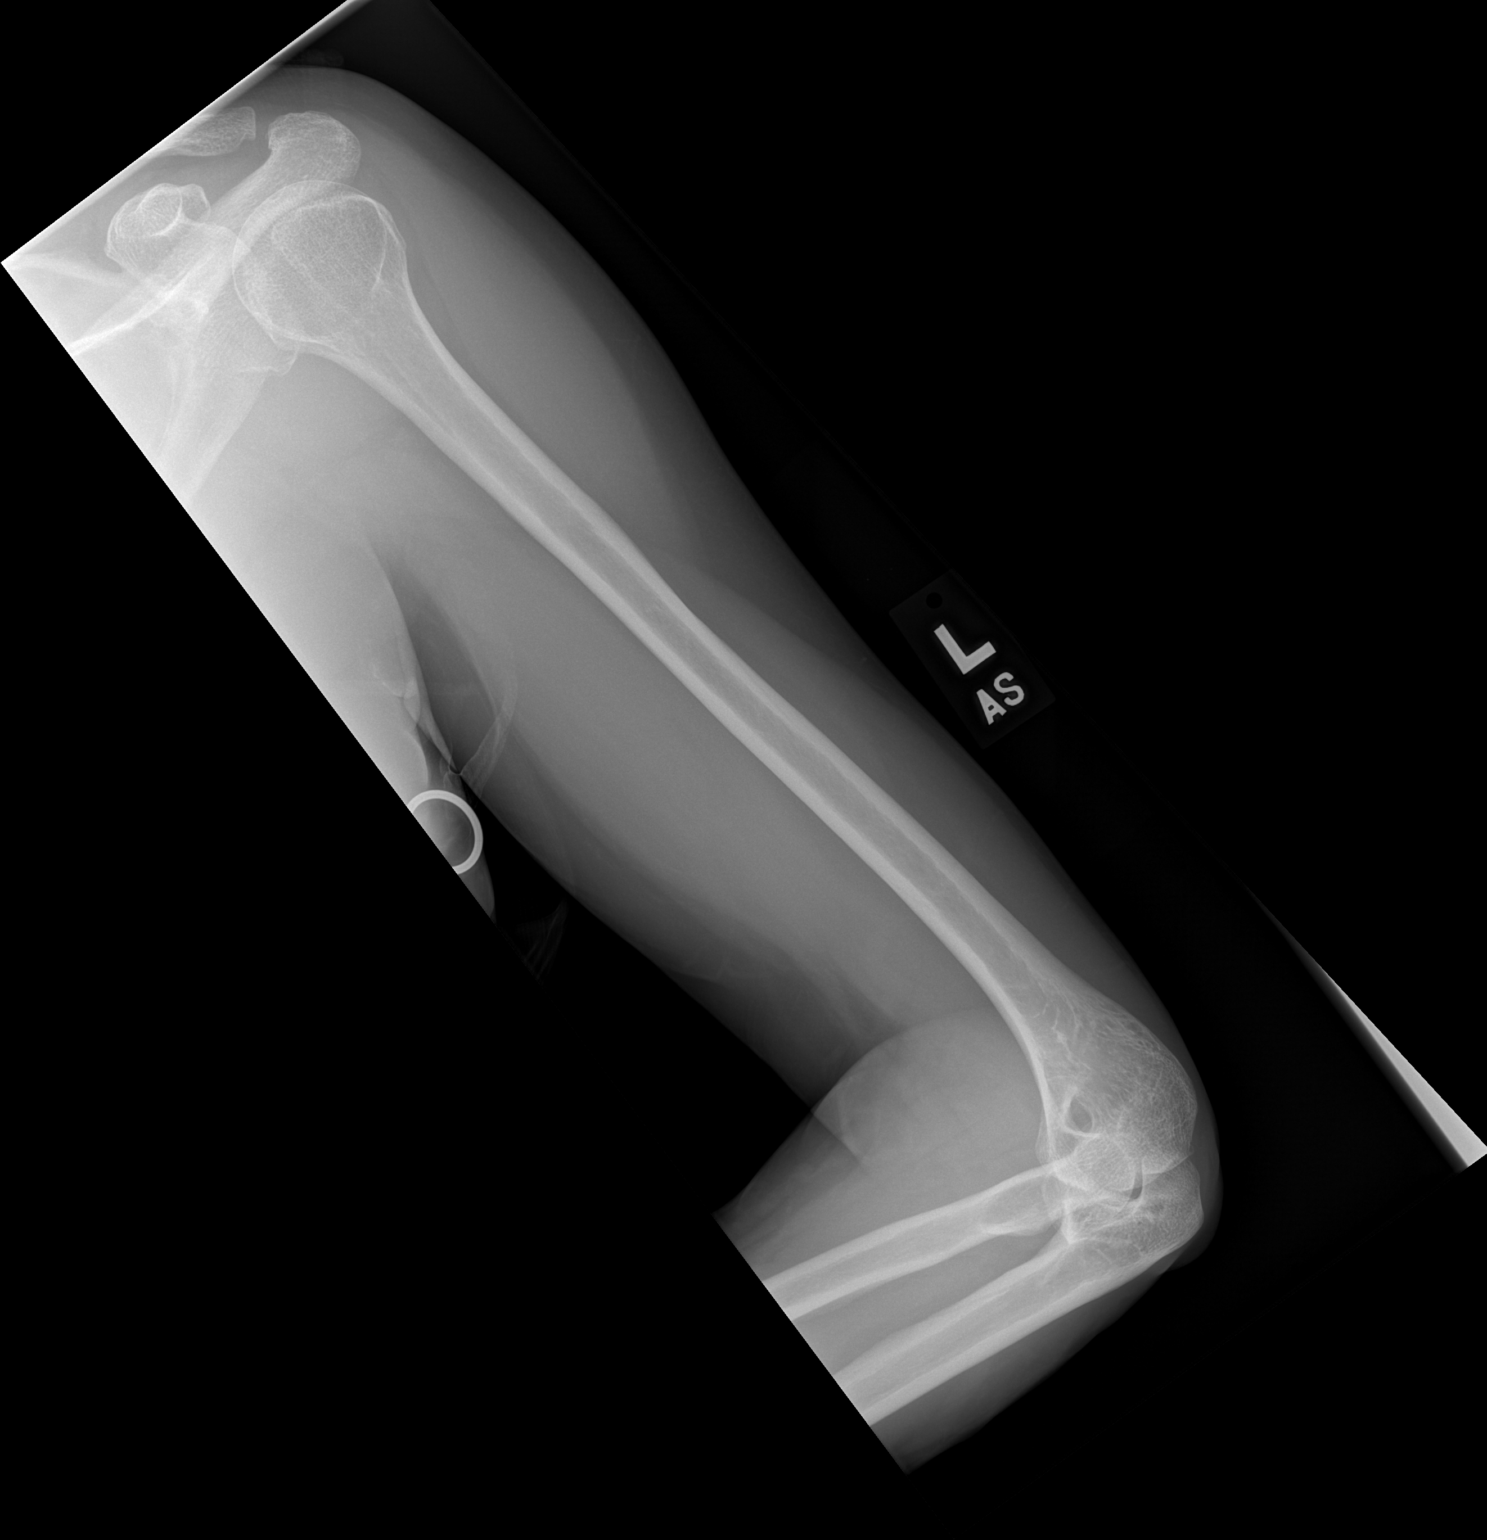

[2 of 2 positions shown; findings below may reference images not displayed]

FINDINGS: There is no evidence of fracture or other focal bone lesions. Soft
tissues are unremarkable.
IMPRESSION: Negative.

## 2015-10-15 ENCOUNTER — Encounter: Payer: Self-pay | Admitting: Neurology

## 2015-10-15 ENCOUNTER — Telehealth: Payer: Self-pay | Admitting: Neurology

## 2015-10-15 ENCOUNTER — Ambulatory Visit (INDEPENDENT_AMBULATORY_CARE_PROVIDER_SITE_OTHER): Payer: Medicare HMO | Admitting: Neurology

## 2015-10-15 VITALS — BP 128/71 | HR 61 | Ht 61.0 in | Wt 144.0 lb

## 2015-10-15 DIAGNOSIS — G441 Vascular headache, not elsewhere classified: Secondary | ICD-10-CM

## 2015-10-15 DIAGNOSIS — R413 Other amnesia: Secondary | ICD-10-CM

## 2015-10-15 DIAGNOSIS — G40309 Generalized idiopathic epilepsy and epileptic syndromes, not intractable, without status epilepticus: Secondary | ICD-10-CM

## 2015-10-15 NOTE — Telephone Encounter (Signed)
I will try to get the patient worked in today.

## 2015-10-15 NOTE — Telephone Encounter (Signed)
I called the patient's daughter. Appointment scheduled for today at 4.

## 2015-10-15 NOTE — Progress Notes (Signed)
Reason for visit: Possible seizure  Samantha Huffman is an 60 y.o. female  History of present illness:  Samantha Huffman is a 60 year old right-handed black female with a history of episodes of what have been thought to be seizures. Prior workups have shown left brain sharp wave activity. The patient however has episodes that clinically sound more like migraine headache. The patient has had a recurring event this morning, she comes into the office today. The patient woke up this morning with a headache across the front of the head. The patient felt weak all over, she had some blurring of vision. She did have some difficulty with speech. She denied any focal numbness or weakness. She denies any balance issues or difficulty with swallowing. The headache has persisted throughout the day, but it has improved some. The patient has felt some nausea, with no vomiting. The patient does have some phonophobia, no photophobia. She is on Topamax, but she is still on 25 mg twice daily, she never went to the 50 mg twice daily dose. She has been tapered off of the Depakote. She remains on Keppra 500 mg twice daily.  Past Medical History  Diagnosis Date  . S/P CABG x 2   . Hypertension   . Diabetes mellitus without complication ([place])   . Seizures ([place])   . Generalized convulsive epilepsy without mention of intractable epilepsy   . Memory deficit   . Dyslipidemia   . Hypercholesteremia   . Carotid artery stenosis     (LEFT) 50-70% Right side is less then 50%. ICA stenosis  . Coronary artery disease     70-80% LM ostium,60% ostial LAD,40%D1,40% mid RCA. Low normal LVF EF 50-55% s/p CABG  . OSA (obstructive sleep apnea) 07/30/2015  . Nephrolithiasis 07/30/2015    S/p lithotripsy x 3 and right and left ureter stents  . Pulmonary HTN ([place]) 07/30/2015    Mild with PASP 35-72mmHg by echo 0000000 with diastolic dysfunction  . Vasovagal syncope 07/30/2015    2011  . Carotid artery stenosis, asymptomatic 07/30/2015    < 50%  bilaterally 10/2014  . Headache 09/15/2015    Past Surgical History  Procedure Laterality Date  . Coronary artery bypass graft  March, 2014    LIMA to LAD, left radial to LCx  . Abdominal hysterectomy      with BSO  . Cesarean section    . Lithotripsy      x 3  . Ureteral stent placement      Family History  Problem Relation Age of Onset  . Cancer Father   . Diabetes Father   . Diabetes Brother   . Stroke Brother   . Diabetes Brother   . Heart disease Brother     CAD with CABG  . Heart disease Sister     CAD with MI  . Heart attack Sister     Social history:  reports that she has been smoking Cigarettes.  She has a 13.2 pack-year smoking history. She quit smokeless tobacco use about 2 years ago. She reports that she drinks alcohol. She reports that she does not use illicit drugs.    Allergies  Allergen Reactions  . Depakote [Divalproex Sodium]     Hyperammonemia    Medications:  Prior to Admission medications   Medication Sig Start Date End Date Taking? Authorizing Provider  amlodipine-atorvastatin (CADUET) 5-80 MG per tablet TAKE 1 TABLET BY MOUTH ONCE DAILY 08/20/13  Yes Sueanne Margarita, MD  aspirin 81 MG  tablet Take 81 mg by mouth daily.   Yes Historical Provider, MD  clopidogrel (PLAVIX) 75 MG tablet TAKE ONE TABLET BY MOUTH DAILY 08/29/15  Yes Sueanne Margarita, MD  escitalopram (LEXAPRO) 10 MG tablet Take 10 mg by mouth daily.   Yes Historical Provider, MD  levETIRAcetam (KEPPRA) 500 MG tablet One half tablet twice daily for one week, then take one full tablet twice daily Patient taking differently: Take 500 mg by mouth 2 (two) times daily.  03/23/13  Yes Kathrynn Ducking, MD  lisinopril (PRINIVIL,ZESTRIL) 10 MG tablet Take 10 mg by mouth daily.   Yes Historical Provider, MD  metoprolol succinate (TOPROL-XL) 25 MG 24 hr tablet Take 25 mg by mouth daily.   Yes Historical Provider, MD  topiramate (TOPAMAX) 25 MG tablet 1 tablet twice daily for 2 weeks, then take 2 tablets  twice daily Patient taking differently: Take 50 mg by mouth 2 (two) times daily.  09/16/15  Yes Kathrynn Ducking, MD    ROS:  Out of a complete 14 system review of symptoms, the patient complains only of the following symptoms, and all other reviewed systems are negative.  Blurred vision Headache  Blood pressure 128/71, pulse 61, height 5\' 1"  (1.549 m), weight 144 lb (65.318 kg).  Physical Exam  General: The patient is alert and cooperative at the time of the examination.  Skin: No significant peripheral edema is noted.   Neurologic Exam  Mental status: The patient is alert and oriented x 3 at the time of the examination. The patient has apparent normal recent and remote memory, with an apparently normal attention span and concentration ability.   Cranial nerves: Facial symmetry is present. Speech is normal, no aphasia or dysarthria is noted. Extraocular movements are full. Visual fields are full.  Motor: The patient has good strength in all 4 extremities.  Sensory examination: Soft touch sensation is symmetric on the face, arms, and legs.  Coordination: The patient has good finger-nose-finger and heel-to-shin bilaterally.  Gait and station: The patient has a normal gait. Tandem gait is normal. Romberg is negative. No drift is seen.  Reflexes: Deep tendon reflexes are symmetric.   Assessment/Plan:  1. Headache  2. Possible seizures  The episodes described by the patient sounds more consistent with migraine than they do with seizures. The patient will go up on the Topamax taking 50 mg twice daily. She will follow-up for her usual appointment on 12/16/2015. She will contact me if she has any further problems.  Jill Alexanders MD 10/15/2015 6:58 PM  [facility] [address] [place],  [address]  Phone [phone] Fax [phone]

## 2015-10-15 NOTE — Telephone Encounter (Signed)
Daughter Tilda Franco [phone] called, states she thinks her mother may have had a seizure, patient had slurred speech (ok now), is complaining of head hurting extremely bad, blurry vision (now resolved), patient states she has had these symptoms before. Daughter would like to get her mother in for an appointment today.

## 2015-10-21 ENCOUNTER — Other Ambulatory Visit: Payer: Self-pay | Admitting: *Deleted

## 2015-10-21 MED ORDER — TOPIRAMATE 25 MG PO TABS: ORAL_TABLET | ORAL | Status: DC

## 2015-10-21 NOTE — Telephone Encounter (Signed)
30 day supply of Topamax with 3 additional r/f was just sent in in Jan. Pt. requesting 90 day supply, so one 90 day supply with no additional r/f escribed [facility] per faxed request/fim

## 2015-11-10 ENCOUNTER — Telehealth: Payer: Self-pay | Admitting: Neurology

## 2015-11-10 NOTE — Telephone Encounter (Signed)
Daughter Tilda Franco, called to advise, patient has forms for job disability/retirement that need to be filled out each year, will have previous Doctor fill out and will also need Dr. Jannifer Franklin to fill out forms. Daughter advised of $25 form fee and of 14 day turn around time for forms.

## 2015-11-11 ENCOUNTER — Telehealth: Payer: Self-pay | Admitting: *Deleted

## 2015-11-11 DIAGNOSIS — Z0289 Encounter for other administrative examinations: Secondary | ICD-10-CM

## 2015-11-11 NOTE — Telephone Encounter (Signed)
Patient State Retirement Form on  Dr. Jannifer Franklin desk.

## 2015-11-14 NOTE — Telephone Encounter (Signed)
Form completed, reviewed, and signed.  To MR.

## 2015-11-17 ENCOUNTER — Telehealth: Payer: Self-pay | Admitting: *Deleted

## 2015-11-17 NOTE — Telephone Encounter (Signed)
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

## 2015-12-16 ENCOUNTER — Ambulatory Visit (INDEPENDENT_AMBULATORY_CARE_PROVIDER_SITE_OTHER): Payer: Medicare HMO | Admitting: Adult Health

## 2015-12-16 ENCOUNTER — Encounter: Payer: Self-pay | Admitting: Adult Health

## 2015-12-16 VITALS — BP 148/87 | HR 101 | Ht 61.0 in | Wt 143.4 lb

## 2015-12-16 DIAGNOSIS — G43009 Migraine without aura, not intractable, without status migrainosus: Secondary | ICD-10-CM

## 2015-12-16 DIAGNOSIS — R569 Unspecified convulsions: Secondary | ICD-10-CM

## 2015-12-16 MED ORDER — TOPIRAMATE 25 MG PO TABS: 75.0000 mg | ORAL_TABLET | Freq: Two times a day (BID) | ORAL | Status: DC

## 2015-12-16 NOTE — Progress Notes (Addendum)
PATIENT: Samantha Huffman DOB: [birth year]  REASON FOR VISIT: follow up- seizures, possible migraine headaches HISTORY FROM: patient  HISTORY OF PRESENT ILLNESS: [name] is a 60 year old female with a history of seizures and possible migraine headaches. She returns today for follow-up. At the last visit her Topamax was increased to 50 mg twice a day. She reports that she is tolerating this medication well. She states that she's not had any additional seizure events. She describes her seizures as not being able to speak, changes in her vision and occasionally a tremor in the extremities. She denies any loss of consciousness. Denies any loss of bowels or bladder or biting her tongue. She reports that she has approximately 4 headaches a month. She states her headaches are not always severe. Her headaches normally occur in the right or left frontal region. She reports photophobia but denies phonophobia. Denies nausea and vomiting. She states that typically when she gets a headache she has to lay down. She has noticed recently she's been stumbling but denies any falls. Denies any weakness in the lower extremities. The patient reports that at times she does feel slightly depressed. But denies any thoughts of harming herself or others. She returns today for an evaluation.  HISTORY 10/15/15: [name] is a 60 year old right-handed black female with a history of episodes of what have been thought to be seizures. Prior workups have shown left brain sharp wave activity. The patient however has episodes that clinically sound more like migraine headache. The patient has had a recurring event this morning, she comes into the office today. The patient woke up this morning with a headache across the front of the head. The patient felt weak all over, she had some blurring of vision. She did have some difficulty with speech. She denied any focal numbness or weakness. She denies any balance issues or difficulty with  swallowing. The headache has persisted throughout the day, but it has improved some. The patient has felt some nausea, with no vomiting. The patient does have some phonophobia, no photophobia. She is on Topamax, but she is still on 25 mg twice daily, she never went to the 50 mg twice daily dose. She has been tapered off of the Depakote. She remains on Keppra 500 mg twice daily  REVIEW OF SYSTEMS: Out of a complete 14 system review of symptoms, the patient complains only of the following symptoms, and all other reviewed systems are negative.  Light sensitivity, bruise easily, memory loss, headache, depression  ALLERGIES: Allergies  Allergen Reactions  . Depakote [Divalproex Sodium]     Hyperammonemia    HOME MEDICATIONS: Outpatient Prescriptions Prior to Visit  Medication Sig Dispense Refill  . amlodipine-atorvastatin (CADUET) 5-80 MG per tablet TAKE 1 TABLET BY MOUTH ONCE DAILY 30 tablet 1  . aspirin 81 MG tablet Take 81 mg by mouth daily.    . clopidogrel (PLAVIX) 75 MG tablet TAKE ONE TABLET BY MOUTH DAILY 30 tablet 9  . escitalopram (LEXAPRO) 10 MG tablet Take 10 mg by mouth daily.    Marland Kitchen levETIRAcetam (KEPPRA) 500 MG tablet One half tablet twice daily for one week, then take one full tablet twice daily (Patient taking differently: Take 500 mg by mouth 2 (two) times daily. ) 60 tablet 5  . lisinopril (PRINIVIL,ZESTRIL) 10 MG tablet Take 10 mg by mouth daily.    . metoprolol succinate (TOPROL-XL) 25 MG 24 hr tablet Take 25 mg by mouth daily.    Marland Kitchen topiramate (TOPAMAX) 25 MG  tablet 1 tablet twice daily for 2 weeks, then take 2 tablets twice daily 360 tablet 0   No facility-administered medications prior to visit.    PAST MEDICAL HISTORY: Past Medical History  Diagnosis Date  . S/P CABG x 2   . Hypertension   . Diabetes mellitus without complication ([place])   . Seizures ([place])   . Generalized convulsive epilepsy without mention of intractable epilepsy   . Memory deficit   .  Dyslipidemia   . Hypercholesteremia   . Carotid artery stenosis     (LEFT) 50-70% Right side is less then 50%. ICA stenosis  . Coronary artery disease     70-80% LM ostium,60% ostial LAD,40%D1,40% mid RCA. Low normal LVF EF 50-55% s/p CABG  . OSA (obstructive sleep apnea) 07/30/2015  . Nephrolithiasis 07/30/2015    S/p lithotripsy x 3 and right and left ureter stents  . Pulmonary HTN ([place]) 07/30/2015    Mild with PASP 35-66mmHg by echo 0000000 with diastolic dysfunction  . Vasovagal syncope 07/30/2015    2011  . Carotid artery stenosis, asymptomatic 07/30/2015    < 50% bilaterally 10/2014  . Headache 09/15/2015    PAST SURGICAL HISTORY: Past Surgical History  Procedure Laterality Date  . Coronary artery bypass graft  March, 2014    LIMA to LAD, left radial to LCx  . Abdominal hysterectomy      with BSO  . Cesarean section    . Lithotripsy      x 3  . Ureteral stent placement      FAMILY HISTORY: Family History  Problem Relation Age of Onset  . Cancer Father   . Diabetes Father   . Diabetes Brother   . Stroke Brother   . Diabetes Brother   . Heart disease Brother     CAD with CABG  . Heart disease Sister     CAD with MI  . Heart attack Sister     SOCIAL HISTORY: Social History   Social History  . Marital Status: Divorced    Spouse Name: N/A  . Number of Children: 2  . Years of Education: 12   Occupational History  . retired    Social History Main Topics  . Smoking status: Current Every Day Smoker -- 0.33 packs/day for 40 years    Types: Cigarettes  . Smokeless tobacco: Former Systems developer    Quit date: 11/20/2012  . Alcohol Use: Yes     Comment: rare  . Drug Use: No  . Sexual Activity: Not on file   Other Topics Concern  . Not on file   Social History Narrative   Patient drinks about 1 cup of coffee daily.   Patient is right handed.       PHYSICAL EXAM  Filed Vitals:   12/16/15 0937  BP: 148/87  Pulse: 101  Height: 5\' 1"  (1.549 m)  Weight: 143 lb 6.4  oz (65.046 kg)   Body mass index is 27.11 kg/(m^2).  Generalized: Well developed, in no acute distress   Neurological examination  Mentation: Alert oriented to time, place, history taking. Follows all commands speech and language fluent Cranial nerve II-XII: Pupils were equal round reactive to light. Extraocular movements were full, visual field were full on confrontational test. Facial sensation and strength were normal. Uvula tongue midline. Head turning and shoulder shrug  were normal and symmetric. Motor: The motor testing reveals 5 over 5 strength of all 4 extremities. Good symmetric motor tone is noted throughout.  Sensory: Sensory  testing is intact to soft touch on all 4 extremities. No evidence of extinction is noted.  Coordination: Cerebellar testing reveals good finger-nose-finger and heel-to-shin bilaterally.  Gait and station: Gait is normal. Tandem gait is normal. Romberg is negative. No drift is seen.  Reflexes: Deep tendon reflexes are symmetric and normal bilaterally.   DIAGNOSTIC DATA (LABS, IMAGING, TESTING) - I reviewed patient records, labs, notes, testing and imaging myself where available.  Lab Results  Component Value Date   WBC 5.1 09/15/2015   HGB 12.2 02/12/2013   HCT 40.7 09/15/2015   MCV 93 09/15/2015   PLT 208 09/15/2015      Component Value Date/Time   NA 138 09/15/2015 1026   NA 137 02/12/2013 0036   K 4.7 09/15/2015 1026   CL 98 09/15/2015 1026   CO2 23 09/15/2015 1026   GLUCOSE 100* 09/15/2015 1026   GLUCOSE 155* 02/12/2013 0036   BUN 10 09/15/2015 1026   BUN 16 02/12/2013 0036   CREATININE 0.76 09/15/2015 1026   CALCIUM 9.4 09/15/2015 1026   PROT 7.4 09/15/2015 1026   ALBUMIN 4.4 09/15/2015 1026   AST 18 09/15/2015 1026   ALT 19 09/15/2015 1026   ALKPHOS 121* 09/15/2015 1026   BILITOT 0.3 09/15/2015 1026   GFRNONAA 86 09/15/2015 1026   GFRAA 99 09/15/2015 1026   Lab Results  Component Value Date   CHOL 136 08/06/2015   HDL 44*  08/06/2015   LDLCALC 68 08/06/2015   TRIG 118 08/06/2015   CHOLHDL 3.1 08/06/2015      ASSESSMENT AND PLAN 60 y.o. year old female  has a past medical history of S/P CABG x 2; Hypertension; Diabetes mellitus without complication ([place]); Seizures ([place]); Generalized convulsive epilepsy without mention of intractable epilepsy; Memory deficit; Dyslipidemia; Hypercholesteremia; Carotid artery stenosis; Coronary artery disease; OSA (obstructive sleep apnea) (07/30/2015); Nephrolithiasis (07/30/2015); Pulmonary HTN ([place]) (07/30/2015); Vasovagal syncope (07/30/2015); Carotid artery stenosis, asymptomatic (07/30/2015); and Headache (09/15/2015). here with:  1. Seizures, 2. Migraines  Overall the patient is doing well. She will continue on Keppra 500 mg twice a day. We will increase the Topamax to 75 mg twice a day to see if this is beneficial for her ongoing headaches. Patient advised that if her depression worsens after increasing the Topamax she should let us know. Patient advised that if her symptoms worsen or she develops any new symptoms she should let us know. She will return in 4 months or sooner if needed.     Ward Givens, MSN, NP-C 12/16/2015, 10:00 AM [facility] [address], [place], [place] [address] [phone]

## 2015-12-16 NOTE — Progress Notes (Signed)
I agree with the evaluation and plan detailed above by Ward Givens, NP.

## 2015-12-16 NOTE — Patient Instructions (Signed)
Increase Topamax to 75 mg twice a day Continue Keppra If your symptoms worsen or you develop new symptoms please let us know.

## 2015-12-18 ENCOUNTER — Telehealth: Payer: Self-pay | Admitting: Adult Health

## 2015-12-18 MED ORDER — LEVETIRACETAM 500 MG PO TABS: ORAL_TABLET | ORAL | Status: DC

## 2015-12-18 NOTE — Telephone Encounter (Signed)
Patient called to request new Rx for levETIRAcetam (KEPPRA) 500 MG tablet

## 2015-12-18 NOTE — Telephone Encounter (Signed)
Refill complete to [facility].

## 2015-12-22 ENCOUNTER — Telehealth: Payer: Self-pay | Admitting: Neurology

## 2015-12-22 NOTE — Telephone Encounter (Signed)
I called patient. She has requested a handicapped sticker be authorized for her car. From a neurologic standpoint she does not fulfill any requirements for this, she may take this to her cardiologist to see if her heart condition is severe enough that he would allow her to have a handicap sticker.

## 2015-12-24 ENCOUNTER — Telehealth: Payer: Self-pay | Admitting: Cardiology

## 2015-12-24 NOTE — Telephone Encounter (Signed)
Walk In pt Form-Parking Pla-Card-dropped off for completion gave to [facility]

## 2015-12-30 NOTE — Telephone Encounter (Signed)
Left message for patient that per Dr. Radford Pax, PCP should sign parking placard form. Placed form in the mail to return to patient. Instructed her to call if she has further questions or concerns.

## 2016-02-16 DIAGNOSIS — K635 Polyp of colon: Secondary | ICD-10-CM | POA: Diagnosis not present

## 2016-02-16 DIAGNOSIS — D122 Benign neoplasm of ascending colon: Secondary | ICD-10-CM | POA: Diagnosis not present

## 2016-02-16 DIAGNOSIS — Z1211 Encounter for screening for malignant neoplasm of colon: Secondary | ICD-10-CM | POA: Diagnosis not present

## 2016-04-14 ENCOUNTER — Ambulatory Visit: Payer: Medicare HMO | Admitting: Adult Health

## 2016-04-15 ENCOUNTER — Encounter: Payer: Self-pay | Admitting: Adult Health

## 2016-04-15 ENCOUNTER — Ambulatory Visit (INDEPENDENT_AMBULATORY_CARE_PROVIDER_SITE_OTHER): Payer: Medicare HMO | Admitting: Adult Health

## 2016-04-15 VITALS — BP 144/71 | HR 79 | Ht 61.0 in | Wt 138.6 lb

## 2016-04-15 DIAGNOSIS — G43009 Migraine without aura, not intractable, without status migrainosus: Secondary | ICD-10-CM | POA: Diagnosis not present

## 2016-04-15 DIAGNOSIS — R569 Unspecified convulsions: Secondary | ICD-10-CM

## 2016-04-15 NOTE — Patient Instructions (Signed)
Continue Topamax 75 mg twice a day Continue Keppra If your symptoms worsen or you develop new symptoms please let us know.

## 2016-04-15 NOTE — Progress Notes (Signed)
I have read the note, and I agree with the clinical assessment and plan.  Samantha Huffman KEITH   

## 2016-04-15 NOTE — Progress Notes (Signed)
PATIENT: Samantha Huffman DOB: 1955/11/19  REASON FOR VISIT: follow up- seizures, headaches HISTORY FROM: patient  HISTORY OF PRESENT ILLNESS: [name] is a 60 year old female with a history of seizures and possible migraine headaches. She returns today for follow-up. At the last visit we increased Topamax to 75 mg twice a day. She reports that this has been "wonderful." She states that she has not had a headache since she increased this medicine. She continues on Keppra. Denies any seizure events. She operates a Teacher, music without difficulty. She is able to complete all ADLs independently. She states that she continues to have trouble comprehending things. For that reason she does not read a lot and she has her daughter help her with her finances so she does not make any big mistakes. She states that this has been going on since her headaches initially began. She does not feel that Topamax has made this worse. She returns today for an evaluation.  HISTORY 4/25/17[name] is a 60 year old female with a history of seizures and possible migraine headaches. She returns today for follow-up. At the last visit her Topamax was increased to 50 mg twice a day. She reports that she is tolerating this medication well. She states that she's not had any additional seizure events. She describes her seizures as not being able to speak, changes in her vision and occasionally a tremor in the extremities. She denies any loss of consciousness. Denies any loss of bowels or bladder or biting her tongue. She reports that she has approximately 4 headaches a month. She states her headaches are not always severe. Her headaches normally occur in the right or left frontal region. She reports photophobia but denies phonophobia. Denies nausea and vomiting. She states that typically when she gets a headache she has to lay down. She has noticed recently she's been stumbling but denies any falls. Denies any weakness in the lower  extremities. The patient reports that at times she does feel slightly depressed. But denies any thoughts of harming herself or others. She returns today for an evaluation.  HISTORY 10/15/15: [name] is a 60 year old right-handed black female with a history of episodes of what have been thought to be seizures. Prior workups have shown left brain sharp wave activity. The patient however has episodes that clinically sound more like migraine headache. The patient has had a recurring event this morning, she comes into the office today. The patient woke up this morning with a headache across the front of the head. The patient felt weak all over, she had some blurring of vision. She did have some difficulty with speech. She denied any focal numbness or weakness. She denies any balance issues or difficulty with swallowing. The headache has persisted throughout the day, but it has improved some. The patient has felt some nausea, with no vomiting. The patient does have some phonophobia, no photophobia. She is on Topamax, but she is still on 25 mg twice daily, she never went to the 50 mg twice daily dose. She has been tapered off of the Depakote. She remains on Keppra 500 mg twice daily   REVIEW OF SYSTEMS: Out of a complete 14 system review of symptoms, the patient complains only of the following symptoms, and all other reviewed systems are negative.  See history of present illness  ALLERGIES: Allergies  Allergen Reactions  . Depakote [Divalproex Sodium]     Hyperammonemia    HOME MEDICATIONS: Outpatient Medications Prior to Visit  Medication Sig Dispense  Refill  . amLODipine (NORVASC) 10 MG tablet Take 10 mg by mouth daily. Reported on 12/16/2015  12  . amlodipine-atorvastatin (CADUET) 5-80 MG per tablet TAKE 1 TABLET BY MOUTH ONCE DAILY 30 tablet 1  . aspirin 81 MG tablet Take 81 mg by mouth daily.    . clopidogrel (PLAVIX) 75 MG tablet TAKE ONE TABLET BY MOUTH DAILY 30 tablet 9  . escitalopram  (LEXAPRO) 10 MG tablet Take 10 mg by mouth daily.    Marland Kitchen levETIRAcetam (KEPPRA) 500 MG tablet Take 500mg  by mouth 2 times daily. 60 tablet 5  . lisinopril (PRINIVIL,ZESTRIL) 10 MG tablet Take 10 mg by mouth daily.    . metoprolol succinate (TOPROL-XL) 25 MG 24 hr tablet Take 25 mg by mouth daily.    Marland Kitchen topiramate (TOPAMAX) 25 MG tablet Take 3 tablets (75 mg total) by mouth 2 (two) times daily. 540 tablet 3   No facility-administered medications prior to visit.     PAST MEDICAL HISTORY: Past Medical History:  Diagnosis Date  . Carotid artery stenosis    (LEFT) 50-70% Right side is less then 50%. ICA stenosis  . Carotid artery stenosis, asymptomatic 07/30/2015   < 50% bilaterally 10/2014  . Coronary artery disease    70-80% LM ostium,60% ostial LAD,40%D1,40% mid RCA. Low normal LVF EF 50-55% s/p CABG  . Diabetes mellitus without complication ([place])   . Dyslipidemia   . Generalized convulsive epilepsy without mention of intractable epilepsy   . Headache 09/15/2015  . Hypercholesteremia   . Hypertension   . Memory deficit   . Nephrolithiasis 07/30/2015   S/p lithotripsy x 3 and right and left ureter stents  . OSA (obstructive sleep apnea) 07/30/2015  . Pulmonary HTN ([place]) 07/30/2015   Mild with PASP 35-27mmHg by echo 0000000 with diastolic dysfunction  . S/P CABG x 2   . Seizures ([place])   . Vasovagal syncope 07/30/2015   2011    PAST SURGICAL HISTORY: Past Surgical History:  Procedure Laterality Date  . ABDOMINAL HYSTERECTOMY     with BSO  . CESAREAN SECTION    . CORONARY ARTERY BYPASS GRAFT  March, 2014   LIMA to LAD, left radial to LCx  . LITHOTRIPSY     x 3  . URETERAL STENT PLACEMENT      FAMILY HISTORY: Family History  Problem Relation Age of Onset  . Cancer Father   . Diabetes Father   . Diabetes Brother   . Stroke Brother   . Diabetes Brother   . Heart disease Brother     CAD with CABG  . Heart disease Sister     CAD with MI  . Heart attack Sister     SOCIAL  HISTORY: Social History   Social History  . Marital status: Divorced    Spouse name: N/A  . Number of children: 2  . Years of education: 12   Occupational History  . retired    Social History Main Topics  . Smoking status: Current Every Day Smoker    Packs/day: 0.33    Years: 40.00    Types: Cigarettes  . Smokeless tobacco: Former Systems developer    Quit date: 11/20/2012  . Alcohol use Yes     Comment: rare  . Drug use: No  . Sexual activity: Not on file   Other Topics Concern  . Not on file   Social History Narrative   Patient drinks about 1 cup of coffee daily.   Patient is right handed.  PHYSICAL EXAM  Vitals:   04/15/16 1447  BP: (!) 144/71  Pulse: 79  Weight: 138 lb 9.6 oz (62.9 kg)  Height: 5\' 1"  (1.549 m)   Body mass index is 26.19 kg/m.  Generalized: Well developed, in no acute distress   Neurological examination  Mentation: Alert oriented to time, place, history taking. Follows all commands speech and language fluent Cranial nerve II-XII: Pupils were equal round reactive to light. Extraocular movements were full, visual field were full on confrontational test. Facial sensation and strength were normal. Uvula tongue midline. Head turning and shoulder shrug  were normal and symmetric. Motor: The motor testing reveals 5 over 5 strength of all 4 extremities. Good symmetric motor tone is noted throughout.  Sensory: Sensory testing is intact to soft touch on all 4 extremities. No evidence of extinction is noted.  Coordination: Cerebellar testing reveals good finger-nose-finger and heel-to-shin bilaterally.  Gait and station: Gait is normal. Tandem gait is normal. Romberg is negative. No drift is seen.  Reflexes: Deep tendon reflexes are symmetric and normal bilaterally.   DIAGNOSTIC DATA (LABS, IMAGING, TESTING) - I reviewed patient records, labs, notes, testing and imaging myself where available.  Lab Results  Component Value Date   WBC 5.1 09/15/2015   HGB  12.2 02/12/2013   HCT 40.7 09/15/2015   MCV 93 09/15/2015   PLT 208 09/15/2015      Component Value Date/Time   NA 138 09/15/2015 1026   K 4.7 09/15/2015 1026   CL 98 09/15/2015 1026   CO2 23 09/15/2015 1026   GLUCOSE 100 (H) 09/15/2015 1026   GLUCOSE 155 (H) 02/12/2013 0036   BUN 10 09/15/2015 1026   CREATININE 0.76 09/15/2015 1026   CALCIUM 9.4 09/15/2015 1026   PROT 7.4 09/15/2015 1026   ALBUMIN 4.4 09/15/2015 1026   AST 18 09/15/2015 1026   ALT 19 09/15/2015 1026   ALKPHOS 121 (H) 09/15/2015 1026   BILITOT 0.3 09/15/2015 1026   GFRNONAA 86 09/15/2015 1026   GFRAA 99 09/15/2015 1026   Lab Results  Component Value Date   CHOL 136 08/06/2015   HDL 44 (L) 08/06/2015   LDLCALC 68 08/06/2015   TRIG 118 08/06/2015   CHOLHDL 3.1 08/06/2015     ASSESSMENT AND PLAN 60 y.o. year old female  has a past medical history of Carotid artery stenosis; Carotid artery stenosis, asymptomatic (07/30/2015); Coronary artery disease; Diabetes mellitus without complication ([place]); Dyslipidemia; Generalized convulsive epilepsy without mention of intractable epilepsy; Headache (09/15/2015); Hypercholesteremia; Hypertension; Memory deficit; Nephrolithiasis (07/30/2015); OSA (obstructive sleep apnea) (07/30/2015); Pulmonary HTN ([place]) (07/30/2015); S/P CABG x 2; Seizures ([place]); and Vasovagal syncope (07/30/2015). here with:  1. Seizures 2. Migraine headaches  Overall the patient has done well. She is not had any headaches since we increased Topamax. She will continue on Topamax 75 mg twice a day. Continue on Keppra for seizures. Advised that if her headache frequency or severity increases she should let us know. Also should make [place] aware she has any seizure events. She will follow-up in 6 months or sooner if needed.     Ward Givens, MSN, NP-C 04/15/2016, 2:59 PM [facility] [address], [place],  [address] [phone]

## 2016-05-25 DIAGNOSIS — Z23 Encounter for immunization: Secondary | ICD-10-CM | POA: Diagnosis not present

## 2016-06-05 ENCOUNTER — Other Ambulatory Visit: Payer: Self-pay | Admitting: Neurology

## 2016-06-07 DIAGNOSIS — E785 Hyperlipidemia, unspecified: Secondary | ICD-10-CM | POA: Diagnosis not present

## 2016-06-07 DIAGNOSIS — Z7902 Long term (current) use of antithrombotics/antiplatelets: Secondary | ICD-10-CM | POA: Diagnosis not present

## 2016-06-07 DIAGNOSIS — G40909 Epilepsy, unspecified, not intractable, without status epilepticus: Secondary | ICD-10-CM | POA: Diagnosis not present

## 2016-06-07 DIAGNOSIS — Z951 Presence of aortocoronary bypass graft: Secondary | ICD-10-CM | POA: Diagnosis not present

## 2016-06-07 DIAGNOSIS — Z7982 Long term (current) use of aspirin: Secondary | ICD-10-CM | POA: Diagnosis not present

## 2016-06-07 DIAGNOSIS — R072 Precordial pain: Secondary | ICD-10-CM | POA: Diagnosis not present

## 2016-06-07 DIAGNOSIS — I2510 Atherosclerotic heart disease of native coronary artery without angina pectoris: Secondary | ICD-10-CM | POA: Diagnosis not present

## 2016-06-07 DIAGNOSIS — E119 Type 2 diabetes mellitus without complications: Secondary | ICD-10-CM | POA: Diagnosis not present

## 2016-06-07 DIAGNOSIS — Z880 Allergy status to penicillin: Secondary | ICD-10-CM | POA: Diagnosis not present

## 2016-06-07 DIAGNOSIS — R079 Chest pain, unspecified: Secondary | ICD-10-CM | POA: Diagnosis not present

## 2016-06-07 DIAGNOSIS — R001 Bradycardia, unspecified: Secondary | ICD-10-CM | POA: Diagnosis not present

## 2016-06-07 DIAGNOSIS — R0602 Shortness of breath: Secondary | ICD-10-CM | POA: Diagnosis not present

## 2016-06-07 DIAGNOSIS — I10 Essential (primary) hypertension: Secondary | ICD-10-CM | POA: Diagnosis not present

## 2016-06-07 DIAGNOSIS — R0789 Other chest pain: Secondary | ICD-10-CM | POA: Diagnosis not present

## 2016-06-08 DIAGNOSIS — R0789 Other chest pain: Secondary | ICD-10-CM | POA: Diagnosis not present

## 2016-06-08 DIAGNOSIS — R072 Precordial pain: Secondary | ICD-10-CM | POA: Diagnosis not present

## 2016-06-10 ENCOUNTER — Ambulatory Visit (INDEPENDENT_AMBULATORY_CARE_PROVIDER_SITE_OTHER): Payer: Medicare HMO | Admitting: Physician Assistant

## 2016-06-10 ENCOUNTER — Telehealth: Payer: Self-pay | Admitting: Cardiology

## 2016-06-10 ENCOUNTER — Observation Stay (HOSPITAL_COMMUNITY)
Admission: AD | Admit: 2016-06-10 | Discharge: 2016-06-11 | Disposition: A | Discharge status: Home / Self Care | Payer: Medicare HMO | Source: Ambulatory Visit | Attending: Internal Medicine | Admitting: Internal Medicine

## 2016-06-10 ENCOUNTER — Encounter (INDEPENDENT_AMBULATORY_CARE_PROVIDER_SITE_OTHER): Payer: Self-pay

## 2016-06-10 ENCOUNTER — Encounter: Payer: Self-pay | Admitting: Physician Assistant

## 2016-06-10 VITALS — BP 117/60 | HR 82 | Ht 61.0 in | Wt 138.8 lb

## 2016-06-10 DIAGNOSIS — Z951 Presence of aortocoronary bypass graft: Secondary | ICD-10-CM | POA: Insufficient documentation

## 2016-06-10 DIAGNOSIS — F17210 Nicotine dependence, cigarettes, uncomplicated: Secondary | ICD-10-CM | POA: Diagnosis not present

## 2016-06-10 DIAGNOSIS — I2582 Chronic total occlusion of coronary artery: Secondary | ICD-10-CM | POA: Diagnosis not present

## 2016-06-10 DIAGNOSIS — G40409 Other generalized epilepsy and epileptic syndromes, not intractable, without status epilepticus: Secondary | ICD-10-CM | POA: Insufficient documentation

## 2016-06-10 DIAGNOSIS — R079 Chest pain, unspecified: Principal | ICD-10-CM | POA: Insufficient documentation

## 2016-06-10 DIAGNOSIS — I2510 Atherosclerotic heart disease of native coronary artery without angina pectoris: Secondary | ICD-10-CM | POA: Insufficient documentation

## 2016-06-10 DIAGNOSIS — Z7902 Long term (current) use of antithrombotics/antiplatelets: Secondary | ICD-10-CM | POA: Diagnosis not present

## 2016-06-10 DIAGNOSIS — Z720 Tobacco use: Secondary | ICD-10-CM

## 2016-06-10 DIAGNOSIS — E785 Hyperlipidemia, unspecified: Secondary | ICD-10-CM | POA: Diagnosis not present

## 2016-06-10 DIAGNOSIS — Z79899 Other long term (current) drug therapy: Secondary | ICD-10-CM | POA: Insufficient documentation

## 2016-06-10 DIAGNOSIS — I200 Unstable angina: Secondary | ICD-10-CM | POA: Diagnosis not present

## 2016-06-10 DIAGNOSIS — I10 Essential (primary) hypertension: Secondary | ICD-10-CM | POA: Diagnosis present

## 2016-06-10 DIAGNOSIS — Z23 Encounter for immunization: Secondary | ICD-10-CM | POA: Diagnosis not present

## 2016-06-10 DIAGNOSIS — I6523 Occlusion and stenosis of bilateral carotid arteries: Secondary | ICD-10-CM

## 2016-06-10 DIAGNOSIS — E7800 Pure hypercholesterolemia, unspecified: Secondary | ICD-10-CM | POA: Insufficient documentation

## 2016-06-10 DIAGNOSIS — Z7982 Long term (current) use of aspirin: Secondary | ICD-10-CM | POA: Insufficient documentation

## 2016-06-10 DIAGNOSIS — I6529 Occlusion and stenosis of unspecified carotid artery: Secondary | ICD-10-CM | POA: Diagnosis present

## 2016-06-10 DIAGNOSIS — R69 Illness, unspecified: Secondary | ICD-10-CM | POA: Diagnosis not present

## 2016-06-10 DIAGNOSIS — E119 Type 2 diabetes mellitus without complications: Secondary | ICD-10-CM | POA: Insufficient documentation

## 2016-06-10 DIAGNOSIS — R0602 Shortness of breath: Secondary | ICD-10-CM

## 2016-06-10 HISTORY — DX: Type 2 diabetes mellitus without complications: E11.9

## 2016-06-10 HISTORY — DX: Other specified postprocedural states: Z98.890

## 2016-06-10 HISTORY — DX: Migraine, unspecified, not intractable, without status migrainosus: G43.909

## 2016-06-10 HISTORY — DX: Other specified postprocedural states: R11.2

## 2016-06-10 HISTORY — DX: Family history of other specified conditions: Z84.89

## 2016-06-10 LAB — COMPREHENSIVE METABOLIC PANEL
ALT: 17 U/L (ref 14–54)
AST: 21 U/L (ref 15–41)
Albumin: 3.9 g/dL (ref 3.5–5.0)
Alkaline Phosphatase: 124 U/L (ref 38–126)
Anion gap: 9 (ref 5–15)
BUN: 13 mg/dL (ref 6–20)
CHLORIDE: 111 mmol/L (ref 101–111)
CO2: 21 mmol/L — AB (ref 22–32)
CREATININE: 0.92 mg/dL (ref 0.44–1.00)
Calcium: 9.4 mg/dL (ref 8.9–10.3)
GFR calc Af Amer: >60 mL/min (ref >60)
GFR calc non Af Amer: >60 mL/min (ref >60)
Glucose, Bld: 85 mg/dL (ref 65–99)
Potassium: 4.2 mmol/L (ref 3.5–5.1)
SODIUM: 141 mmol/L (ref 135–145)
Total Bilirubin: 0.4 mg/dL (ref 0.3–1.2)
Total Protein: 6.8 g/dL (ref 6.5–8.1)

## 2016-06-10 LAB — CBC WITH DIFFERENTIAL/PLATELET
Basophils Absolute: 0 10*3/uL (ref 0.0–0.1)
Basophils Relative: 0 %
EOS ABS: 0.1 10*3/uL (ref 0.0–0.7)
Eosinophils Relative: 2 %
HCT: 37.5 % (ref 36.0–46.0)
HEMOGLOBIN: 12.4 g/dL (ref 12.0–15.0)
LYMPHS ABS: 2.5 10*3/uL (ref 0.7–4.0)
Lymphocytes Relative: 37 %
MCH: 30.1 pg (ref 26.0–34.0)
MCHC: 33.1 g/dL (ref 30.0–36.0)
MCV: 91 fL (ref 78.0–100.0)
MONOS PCT: 4 %
Monocytes Absolute: 0.3 10*3/uL (ref 0.1–1.0)
NEUTROS PCT: 57 %
Neutro Abs: 3.8 10*3/uL (ref 1.7–7.7)
Platelets: 237 10*3/uL (ref 150–400)
RBC: 4.12 MIL/uL (ref 3.87–5.11)
RDW: 12.9 % (ref 11.5–15.5)
WBC: 6.8 10*3/uL (ref 4.0–10.5)

## 2016-06-10 LAB — PROTIME-INR
INR: 1.14
PROTHROMBIN TIME: 14.7 s (ref 11.4–15.2)

## 2016-06-10 LAB — HEPARIN LEVEL (UNFRACTIONATED): Heparin Unfractionated: 0.49 IU/mL (ref 0.30–0.70)

## 2016-06-10 LAB — TROPONIN I

## 2016-06-10 LAB — GLUCOSE, CAPILLARY: Glucose-Capillary: 219 mg/dL — ABNORMAL HIGH (ref 65–99)

## 2016-06-10 MED ORDER — ATORVASTATIN CALCIUM 80 MG PO TABS
80.0000 mg | ORAL_TABLET | Freq: Every day | ORAL | Status: DC
  Administered 2016-06-11: 80 mg via ORAL
  Filled 2016-06-10 (×2): qty 1

## 2016-06-10 MED ORDER — ASPIRIN EC 81 MG PO TBEC
81.0000 mg | DELAYED_RELEASE_TABLET | Freq: Every day | ORAL | Status: DC
  Administered 2016-06-11: 81 mg via ORAL
  Filled 2016-06-10: qty 1

## 2016-06-10 MED ORDER — ASPIRIN 81 MG PO CHEW
81.0000 mg | CHEWABLE_TABLET | ORAL | Status: AC
  Administered 2016-06-11: 81 mg via ORAL
  Filled 2016-06-10: qty 1

## 2016-06-10 MED ORDER — ASPIRIN 81 MG PO CHEW
324.0000 mg | CHEWABLE_TABLET | ORAL | Status: AC
  Administered 2016-06-10: 243 mg via ORAL
  Filled 2016-06-10: qty 4

## 2016-06-10 MED ORDER — SODIUM CHLORIDE 0.9 % IV SOLN: 250.0000 mL | INTRAVENOUS | Status: DC | PRN

## 2016-06-10 MED ORDER — ACETAMINOPHEN 325 MG PO TABS: 650.0000 mg | ORAL_TABLET | ORAL | Status: DC | PRN

## 2016-06-10 MED ORDER — HEPARIN BOLUS VIA INFUSION
3500.0000 [IU] | Freq: Once | INTRAVENOUS | Status: AC
  Administered 2016-06-10: 3500 [IU] via INTRAVENOUS
  Filled 2016-06-10: qty 3500

## 2016-06-10 MED ORDER — SODIUM CHLORIDE 0.9% FLUSH: 3.0000 mL | Freq: Two times a day (BID) | INTRAVENOUS | Status: DC

## 2016-06-10 MED ORDER — SODIUM CHLORIDE 0.9% FLUSH: 3.0000 mL | INTRAVENOUS | Status: DC | PRN

## 2016-06-10 MED ORDER — SODIUM CHLORIDE 0.9 % WEIGHT BASED INFUSION: 1.0000 mL/kg/h | INTRAVENOUS | Status: DC

## 2016-06-10 MED ORDER — ONDANSETRON HCL 4 MG/2ML IJ SOLN: 4.0000 mg | Freq: Four times a day (QID) | INTRAMUSCULAR | Status: DC | PRN

## 2016-06-10 MED ORDER — ASPIRIN 300 MG RE SUPP: 300.0000 mg | RECTAL | Status: AC

## 2016-06-10 MED ORDER — METOPROLOL SUCCINATE ER 25 MG PO TB24
25.0000 mg | ORAL_TABLET | Freq: Every day | ORAL | Status: DC
  Administered 2016-06-11: 25 mg via ORAL
  Filled 2016-06-10: qty 1

## 2016-06-10 MED ORDER — SODIUM CHLORIDE 0.9 % WEIGHT BASED INFUSION
3.0000 mL/kg/h | INTRAVENOUS | Status: AC
  Administered 2016-06-11: 3 mL/kg/h via INTRAVENOUS

## 2016-06-10 MED ORDER — ESCITALOPRAM OXALATE 10 MG PO TABS: 10.0000 mg | ORAL_TABLET | Freq: Every day | ORAL | Status: DC

## 2016-06-10 MED ORDER — AMLODIPINE-ATORVASTATIN 5-80 MG PO TABS: 1.0000 | ORAL_TABLET | Freq: Every day | ORAL | Status: DC

## 2016-06-10 MED ORDER — INSULIN ASPART 100 UNIT/ML ~~LOC~~ SOLN: 0.0000 [IU] | Freq: Three times a day (TID) | SUBCUTANEOUS | Status: DC

## 2016-06-10 MED ORDER — LEVETIRACETAM 500 MG PO TABS
500.0000 mg | ORAL_TABLET | Freq: Two times a day (BID) | ORAL | Status: DC
  Administered 2016-06-10 – 2016-06-11 (×2): 500 mg via ORAL
  Filled 2016-06-10 (×2): qty 1

## 2016-06-10 MED ORDER — NITROGLYCERIN IN D5W 200-5 MCG/ML-% IV SOLN
5.0000 ug/min | INTRAVENOUS | Status: DC
Start: 2016-06-10 — End: 2016-06-11
  Administered 2016-06-10: 5 ug/min via INTRAVENOUS
  Filled 2016-06-10: qty 250

## 2016-06-10 MED ORDER — LISINOPRIL 10 MG PO TABS: 10.0000 mg | ORAL_TABLET | Freq: Every day | ORAL | Status: DC

## 2016-06-10 MED ORDER — AMLODIPINE BESYLATE 5 MG PO TABS
5.0000 mg | ORAL_TABLET | Freq: Every day | ORAL | Status: DC
  Administered 2016-06-11: 5 mg via ORAL
  Filled 2016-06-10: qty 1

## 2016-06-10 MED ORDER — TOPIRAMATE 25 MG PO TABS
75.0000 mg | ORAL_TABLET | Freq: Two times a day (BID) | ORAL | Status: DC
  Administered 2016-06-10: 75 mg via ORAL
  Filled 2016-06-10 (×3): qty 3

## 2016-06-10 MED ORDER — CLOPIDOGREL BISULFATE 75 MG PO TABS
75.0000 mg | ORAL_TABLET | Freq: Every day | ORAL | Status: DC
  Administered 2016-06-11 (×2): 75 mg via ORAL
  Filled 2016-06-10 (×2): qty 1

## 2016-06-10 MED ORDER — HEPARIN (PORCINE) IN NACL 100-0.45 UNIT/ML-% IJ SOLN
750.0000 [IU]/h | INTRAMUSCULAR | Status: DC
  Administered 2016-06-10: 750 [IU]/h via INTRAVENOUS
  Filled 2016-06-10: qty 250

## 2016-06-10 MED ORDER — NITROGLYCERIN 0.4 MG SL SUBL: 0.4000 mg | SUBLINGUAL_TABLET | SUBLINGUAL | Status: DC | PRN

## 2016-06-10 NOTE — Progress Notes (Addendum)
ANTICOAGULATION CONSULT NOTE - Initial Consult  Pharmacy Consult for heparin  Indication: chest pain/ACS  Allergies  Allergen Reactions  . Depakote [Divalproex Sodium] Other (See Comments)    Hyperammonemia  . Penicillins     Other reaction(s): GI intolerance    Patient Measurements:   Heparin Dosing Weight: 63 kg  Vital Signs: Temp: 98.5 F (36.9 C) (10/19 1729) Temp Source: Oral (10/19 1729) BP: 118/61 (10/19 1729) Pulse Rate: 58 (10/19 1729)  Labs: No results for input(s): HGB, HCT, PLT, APTT, LABPROT, INR, HEPARINUNFRC, HEPRLOWMOCWT, CREATININE, CKTOTAL, CKMB, TROPONINI in the last 72 hours.  CrCl cannot be calculated (Patient's most recent lab result is older than the maximum 21 days allowed.).   Medical History: Past Medical History:  Diagnosis Date  . Carotid artery stenosis    (LEFT) 50-70% Right side is less then 50%. ICA stenosis  . Carotid artery stenosis, asymptomatic 07/30/2015   < 50% bilaterally 10/2014  . Coronary artery disease    a. s/p CABG 10/2012.  . Diabetes mellitus ([place])   . Dyslipidemia   . Generalized convulsive epilepsy without mention of intractable epilepsy   . Headache 09/15/2015  . Hypercholesteremia   . Hypertension   . Memory deficit   . Nephrolithiasis 07/30/2015   S/p lithotripsy x 3 and right and left ureter stents  . OSA (obstructive sleep apnea) 07/30/2015  . Pulmonary HTN 07/30/2015   Mild with PASP 35-53mmHg by echo 0000000 with diastolic dysfunction  . Seizures ([place])   . Vasovagal syncope 07/30/2015   2011    Assessment: 41 YOF with hx of CAD, presented with chest pain, to start IV heparin, possible cath in AM.   Goal of Therapy:  Heparin level 0.3-0.7 units/ml Monitor platelets by anticoagulation protocol: Yes   Plan:  Heparin bolus 3500 units Heparin infusion 750 units/hr F/u 6 hr heparin level at midnight Daily heparin level and CBC  Maryanna Shape, PharmD, BCPS  Clinical Pharmacist  Pager: [phone]  06/10/2016,5:34  PM   Addendum: Pharmacy is also asked to clarify patient's home meds amlodipine vs. Caduet.  Patient is currently only taking Caduet 5-80mg  which has been appropriately resumed. Amlodipine 10mg  has been removed from PTA med list.   Thanks!  Maryanna Shape, PharmD, [place], [phone]

## 2016-06-10 NOTE — Telephone Encounter (Signed)
Patient reports she went to the ED at [facility] in [place] on Monday for chest tightness and SOB.  She states they drew "a bunch of lab work and did a stress test." She also states "there was something wrong with her T wave." She was under observation for 24 hours and was discharged. She patient is currently asymptomatic, but reports an occasional "twinge" in her chest.   Spoke with patient's daughter, who states she has the results of testing done at the hospital.   Discussed with Melina Copa, PA. Both Samantha Huffman and patient agree to have OV at 1530 today to discuss results. [name] understands to bring all paperwork from [facility] for [place] to review.

## 2016-06-10 NOTE — H&P (Signed)
Cardiology H&P   Date:  06/10/2016  ID:  Samantha Huffman, DOB 08/31/1955, MRN [id number] PCP:  Donnie Coffin, MD  Cardiologist:  Dr. Radford Pax   Chief Complaint: chest pain  History of Present Illness:  Samantha Huffman is a 60 y.o. female with history of CAD s/p CABG 10/2012 in [place], dyslipidemia, HTN, DM, carotid artery stenosis (1-39% 07/2015 - due 2018), seizures, mild memory deficit who presents for evaluation of chest pain.  Recently she's been having intermittent chest pain but says she tried to ignore it. On October 16th, she was on her way to [place] with her daughter to attend a family member's funeral. Progressively throughout the drive she began to have worsening chest pain. It would come and go, lasting a few minutes at a time, described as a squeezing or pressure. It was associated with SOB. Due to increasing frequency of episodes she went to the ER. They were told there was concern for lack of bloodflow to her heart based on EKG changes. She was admitted overnight and underwent stress test 06/08/16 showing "very small sized reversible defect of mild severity present in apical anterior wall, typical of LAD distribution, low risk, EF 69%, compared to study in 10/2012 no significant change - similar defect described at that time but was larger in size." (Daughter brought in report.) They were told everything looked OK and she was discharged home. She's continued to have intermittent chest discomfort even today in the office visit. It is now worse with walking, climbing steps, or getting emotionally upset. It is not worse with palpation or inspiration. She never had pain before her CABG so this is a new sensation for her. EKG shows new inf TW changes. We do not have any bloodwork available from recent ED visit. No LEE, orthopnea, syncope. She is not tachycardic, tachypneic or hypoxic. No desaturation with ambulation.   Past Medical History:  Diagnosis Date  . Carotid artery stenosis      (LEFT) 50-70% Right side is less then 50%. ICA stenosis  . Carotid artery stenosis, asymptomatic 07/30/2015   < 50% bilaterally 10/2014  . Coronary artery disease    a. s/p CABG 10/2012.  . Diabetes mellitus ([place])   . Dyslipidemia   . Generalized convulsive epilepsy without mention of intractable epilepsy   . Headache 09/15/2015  . Hypercholesteremia   . Hypertension   . Memory deficit   . Nephrolithiasis 07/30/2015   S/p lithotripsy x 3 and right and left ureter stents  . OSA (obstructive sleep apnea) 07/30/2015  . Pulmonary HTN 07/30/2015   Mild with PASP 35-65mmHg by echo 0000000 with diastolic dysfunction  . Seizures ([place])   . Vasovagal syncope 07/30/2015   2011    Past Surgical History:  Procedure Laterality Date  . ABDOMINAL HYSTERECTOMY     with BSO  . CESAREAN SECTION    . CORONARY ARTERY BYPASS GRAFT  March, 2014   LIMA to LAD, left radial to LCx  . LITHOTRIPSY     x 3  . URETERAL STENT PLACEMENT      Current Medications: No current facility-administered medications for this encounter.    Current Outpatient Prescriptions  Medication Sig Dispense Refill  . amLODipine (NORVASC) 10 MG tablet Take 10 mg by mouth daily. Reported on 12/16/2015  12  . amlodipine-atorvastatin (CADUET) 5-80 MG per tablet TAKE 1 TABLET BY MOUTH ONCE DAILY 30 tablet 1  . aspirin 81 MG tablet Take 81 mg by mouth daily.    . clopidogrel (PLAVIX)  75 MG tablet TAKE ONE TABLET BY MOUTH DAILY 30 tablet 9  . escitalopram (LEXAPRO) 10 MG tablet Take 10 mg by mouth daily.    Derrill Memo ON 06/18/2016] levETIRAcetam (KEPPRA) 500 MG tablet TAKE 1 TABLET BY MOUTH TWICE A DAY 60 tablet 5  . lisinopril (PRINIVIL,ZESTRIL) 10 MG tablet Take 10 mg by mouth daily.    . metoprolol succinate (TOPROL-XL) 25 MG 24 hr tablet Take 25 mg by mouth daily.    Marland Kitchen topiramate (TOPAMAX) 25 MG tablet Take 3 tablets (75 mg total) by mouth 2 (two) times daily. 540 tablet 3     Allergies:   Depakote [divalproex sodium]   Social  History   Social History  . Marital status: Divorced    Spouse name: N/A  . Number of children: 2  . Years of education: 12   Occupational History  . retired    Social History Main Topics  . Smoking status: Current Every Day Smoker    Packs/day: 0.33    Years: 40.00    Types: Cigarettes  . Smokeless tobacco: Former Systems developer    Quit date: 11/20/2012  . Alcohol use Yes     Comment: rare  . Drug use: No  . Sexual activity: Not on file   Other Topics Concern  . Not on file   Social History Narrative   Patient drinks about 1 cup of coffee daily.   Patient is right handed.      Family History:  The patient's family history includes Cancer in her father; Diabetes in her brother, brother, and father; Heart attack in her sister; Heart disease in her brother and sister; Stroke in her brother.   ROS:   Please see the history of present illness.  All other systems are reviewed and otherwise negative.    PHYSICAL EXAM:   VS:  There were no vitals taken for this visit.  BMI: There is no height or weight on file to calculate BMI. GEN: Well nourished, well developed AAF, in no acute distress  HEENT: normocephalic, atraumatic Neck: no JVD, carotid bruits, or masses Cardiac: RRR; no murmurs, rubs, or gallops, no edema  Respiratory:  clear to auscultation bilaterally, normal work of breathing GI: soft, nontender, nondistended, + BS MS: no deformity or atrophy  Skin: warm and dry, no rash Neuro:  Alert and Oriented x 3, Strength and sensation are intact, follows commands Psych: euthymic mood, full affect  Wt Readings from Last 3 Encounters:  06/10/16 138 lb 12.8 oz (63 kg)  04/15/16 138 lb 9.6 oz (62.9 kg)  12/16/15 143 lb 6.4 oz (65 kg)      Studies/Labs Reviewed:   EKG:  EKG was ordered today and personally reviewed by me and demonstrates NSR 69bpm, rounded TWI inferiorly and V4-V6. Changes inferiorly appear new.   Recent Labs: 09/15/2015: ALT 19; BUN 10; Creatinine, Ser 0.76;  Platelets 208; Potassium 4.7; Sodium 138   Lipid Panel    Component Value Date/Time   CHOL 136 08/06/2015 0807   TRIG 118 08/06/2015 0807   HDL 44 (L) 08/06/2015 0807   CHOLHDL 3.1 08/06/2015 0807   VLDL 24 08/06/2015 0807   LDLCALC 68 08/06/2015 0807    Additional studies/ records that were reviewed today include: Summarized above.    ASSESSMENT & PLAN:   1. Chest pain - concerning for unstable angina with continued intermittent chest pain. Patient seen/examined with myself and Dr. Harrington Challenger. We recommend admission to [facility] for close observation and treatment  of unstable angina. Will start her on IV heparin and IV NTG. Will trend troponins and check basic labs. Plan cath in AM. Risks and benefits of cardiac catheterization have been discussed with the patient. These include bleeding, infection, kidney damage, stroke, heart attack, death. The patient understands these risks and is willing to proceed. They refused wheelchair or EMS transport. She will be directly admitted to a stepdown bed. Will sign out to hospital team to review labs as soon as they become available. She is on the board for tomorrow with Dr. Burt Knack. If symptoms escalate, may need to consider cardiac cath sooner. 2. CAD - continue ASA, statin, Plavix, BB. 3. HTN - controlled. Continue present regimen. Continue present regimen. Not sure why 2 forms of amlodipine are on her list. Will ask pharmacy to clarify. 4. Carotid stenosis - f/u due in 2018. 5. Tobacco abuse - she has started smoking again. Reviewed importance of cessation. Her daughter inquired about potential cessation aids - had bad reaction to nicotine replacement and Chantix in the past. We briefly discussed Zyban but upon further review of her chart after the visit was over, she has history of seizures thus would not be a candidate for this. 6. Diabetes mellitus - not on any home agents. Check A1c and add SSI.  Disposition: F/u TBD following hospital  stay.  Raechel Ache PA-C  06/10/2016 4:58 PM    [facility] [place], [place], [place]  [address] Phone: [phone]; Fax: [phone]

## 2016-06-10 NOTE — Progress Notes (Addendum)
Cardiology Office Note    Date:  06/10/2016  ID:  Samantha Huffman, DOB 03-18-1956, MRN [id number] PCP:  Donnie Coffin, MD  Cardiologist:  Dr. Radford Pax   Chief Complaint: chest pain  History of Present Illness:  Samantha Huffman is a 60 y.o. female with history of CAD s/p CABG 10/2012 in [place], dyslipidemia, HTN, DM, carotid artery stenosis (1-39% 07/2015 - due 2018), seizures, mild memory deficit who presents for evaluation of chest pain.  Recently she's been having intermittent chest pain but says she tried to ignore it. On October 16th, she was on her way to [place] with her daughter to attend a family member's funeral. Progressively throughout the drive she began to have worsening chest pain. It would come and go, lasting a few minutes at a time, described as a squeezing or pressure. It was associated with SOB. Due to increasing frequency of episodes she went to the ER. They were told there was concern for lack of bloodflow to her heart based on EKG changes. She was admitted overnight and underwent stress test 06/08/16 showing "very small sized reversible defect of mild severity present in apical anterior wall, typical of LAD distribution, low risk, EF 69%, compared to study in 10/2012 no significant change - similar defect described at that time but was larger in size." (Daughter brought in report.) They were told everything looked OK and she was discharged home. She's continued to have intermittent chest discomfort even today in the office visit. It is now worse with walking, climbing steps, or getting emotionally upset. It is not worse with palpation or inspiration. She never had pain before her CABG so this is a new sensation for her. EKG shows new inf TW changes. We do not have any bloodwork available from recent ED visit. No LEE, orthopnea, syncope. She is not tachycardic, tachypneic or hypoxic. No desaturation with ambulation.   Past Medical History:  Diagnosis Date  . Carotid artery  stenosis    (LEFT) 50-70% Right side is less then 50%. ICA stenosis  . Carotid artery stenosis, asymptomatic 07/30/2015   < 50% bilaterally 10/2014  . Coronary artery disease    a. s/p CABG 10/2012.  . Diabetes mellitus ([place])   . Dyslipidemia   . Generalized convulsive epilepsy without mention of intractable epilepsy   . Headache 09/15/2015  . Hypercholesteremia   . Hypertension   . Memory deficit   . Nephrolithiasis 07/30/2015   S/p lithotripsy x 3 and right and left ureter stents  . OSA (obstructive sleep apnea) 07/30/2015  . Pulmonary HTN 07/30/2015   Mild with PASP 35-14mmHg by echo 0000000 with diastolic dysfunction  . Seizures ([place])   . Vasovagal syncope 07/30/2015   2011    Past Surgical History:  Procedure Laterality Date  . ABDOMINAL HYSTERECTOMY     with BSO  . CESAREAN SECTION    . CORONARY ARTERY BYPASS GRAFT  March, 2014   LIMA to LAD, left radial to LCx  . LITHOTRIPSY     x 3  . URETERAL STENT PLACEMENT      Current Medications: Current Outpatient Prescriptions  Medication Sig Dispense Refill  . amLODipine (NORVASC) 10 MG tablet Take 10 mg by mouth daily. Reported on 12/16/2015  12  . amlodipine-atorvastatin (CADUET) 5-80 MG per tablet TAKE 1 TABLET BY MOUTH ONCE DAILY 30 tablet 1  . aspirin 81 MG tablet Take 81 mg by mouth daily.    . clopidogrel (PLAVIX) 75 MG tablet TAKE ONE TABLET BY MOUTH DAILY  30 tablet 9  . escitalopram (LEXAPRO) 10 MG tablet Take 10 mg by mouth daily.    Derrill Memo ON 06/18/2016] levETIRAcetam (KEPPRA) 500 MG tablet TAKE 1 TABLET BY MOUTH TWICE A DAY 60 tablet 5  . lisinopril (PRINIVIL,ZESTRIL) 10 MG tablet Take 10 mg by mouth daily.    . metoprolol succinate (TOPROL-XL) 25 MG 24 hr tablet Take 25 mg by mouth daily.    Marland Kitchen topiramate (TOPAMAX) 25 MG tablet Take 3 tablets (75 mg total) by mouth 2 (two) times daily. 540 tablet 3   No current facility-administered medications for this visit.      Allergies:   Depakote [divalproex sodium]    Social History   Social History  . Marital status: Divorced    Spouse name: N/A  . Number of children: 2  . Years of education: 12   Occupational History  . retired    Social History Main Topics  . Smoking status: Current Every Day Smoker    Packs/day: 0.33    Years: 40.00    Types: Cigarettes  . Smokeless tobacco: Former Systems developer    Quit date: 11/20/2012  . Alcohol use Yes     Comment: rare  . Drug use: No  . Sexual activity: Not Asked   Other Topics Concern  . None   Social History Narrative   Patient drinks about 1 cup of coffee daily.   Patient is right handed.      Family History:  The patient's family history includes Cancer in her father; Diabetes in her brother, brother, and father; Heart attack in her sister; Heart disease in her brother and sister; Stroke in her brother.   ROS:   Please see the history of present illness.  All other systems are reviewed and otherwise negative.    PHYSICAL EXAM:   VS:  BP 117/60   Pulse 82 Comment: walking   115  Ht 5\' 1"  (1.549 m)   Wt 138 lb 12.8 oz (63 kg)   SpO2 95% Comment: walking  BMI 26.23 kg/m   BMI: Body mass index is 26.23 kg/m. GEN: Well nourished, well developed AAF, in no acute distress  HEENT: normocephalic, atraumatic Neck: no JVD, carotid bruits, or masses Cardiac: RRR; no murmurs, rubs, or gallops, no edema  Respiratory:  clear to auscultation bilaterally, normal work of breathing GI: soft, nontender, nondistended, + BS MS: no deformity or atrophy  Skin: warm and dry, no rash Neuro:  Alert and Oriented x 3, Strength and sensation are intact, follows commands Psych: euthymic mood, full affect  Wt Readings from Last 3 Encounters:  06/10/16 138 lb 12.8 oz (63 kg)  04/15/16 138 lb 9.6 oz (62.9 kg)  12/16/15 143 lb 6.4 oz (65 kg)      Studies/Labs Reviewed:   EKG:  EKG was ordered today and personally reviewed by me and demonstrates NSR 69bpm, rounded TWI inferiorly and V4-V6. Changes inferiorly  appear new.   Recent Labs: 09/15/2015: ALT 19; BUN 10; Creatinine, Ser 0.76; Platelets 208; Potassium 4.7; Sodium 138   Lipid Panel    Component Value Date/Time   CHOL 136 08/06/2015 0807   TRIG 118 08/06/2015 0807   HDL 44 (L) 08/06/2015 0807   CHOLHDL 3.1 08/06/2015 0807   VLDL 24 08/06/2015 0807   LDLCALC 68 08/06/2015 0807    Additional studies/ records that were reviewed today include: Summarized above.    ASSESSMENT & PLAN:   1. Chest pain - concerning for unstable angina with continued  intermittent chest pain. Patient seen/examined with myself and Dr. Harrington Challenger. We recommend admission to [facility] for close observation and treatment of unstable angina. Will start her on IV heparin and IV NTG. Will trend troponins and check basic labs. Plan cath in AM. Risks and benefits of cardiac catheterization have been discussed with the patient. These include bleeding, infection, kidney damage, stroke, heart attack, death. The patient understands these risks and is willing to proceed. They refused wheelchair or EMS transport. She will be directly admitted to a stepdown bed. Will sign out to hospital team to review labs as soon as they become available. She is on the board for tomorrow with Dr. Burt Knack. If symptoms escalate, may need to consider cardiac cath sooner. 2. CAD - continue ASA, statin, Plavix, BB. 3. HTN - controlled. Continue present regimen. Not sure why 2 forms of amlodipine are on her list. Will ask pharmacy to clarify. 4. Carotid stenosis - f/u due in 2018. 5. Tobacco abuse - she has started smoking again. Reviewed importance of cessation. Her daughter inquired about potential cessation aids - had bad reaction to nicotine replacement and Chantix in the past. We briefly discussed Zyban but upon further review of her chart after the visit was over, she has history of seizures thus would not be a candidate for this. 6. Diabetes mellitus - not on any home agents. Check A1c and  add SSI.  Disposition: F/u TBD following hospital stay.  Medication Adjustments/Labs and Tests Ordered: Current medicines are reviewed at length with the patient today.  Concerns regarding medicines are outlined above. Medication changes, Labs and Tests ordered today are summarized above and listed in the Patient Instructions accessible in Encounters.   Raechel Ache PA-C  06/10/2016 4:10 PM    [facility] [place], [place], [place]  [address] Phone: [phone]; Fax: [phone]

## 2016-06-10 NOTE — Progress Notes (Addendum)
Amlodipine listed twice in both forms - solo and combination with Caduet. Will ask pharmacy to clarify which one she is taking. Will order Caduet for now, which has the lower dose of 5mg  of amlodipine plus atorvastatin. Trooper Olander PA-C

## 2016-06-10 NOTE — Telephone Encounter (Signed)
New message      Pt c/o of Chest Pain: STAT if CP now or developed within 24 hours  1. Are you having CP right now? Daughter is not sure  2. Are you experiencing any other symptoms (ex. SOB, nausea, vomiting, sweating)? no 3. How long have you been experiencing CP?  mon the 16th pt was traveling to [place].  She began having chest pain.  They went to the ER there.  She stayed overnight---ekg revealed Twave flipped.  Pt' s pain level is between 7-8 at that time.  Daughter is calling to follow up asap with [place].  Tried to schedule pt today with [facility]--but was told to have pt triaged first  4. Is your CP continuous or coming and going?  Comes and goes  5. Have you taken Nitroglycerin?no  ?

## 2016-06-10 NOTE — Patient Instructions (Addendum)
You are being direct admitted.  Report to [facility] and go to Admitting and they will take care of you.

## 2016-06-11 ENCOUNTER — Ambulatory Visit (HOSPITAL_COMMUNITY): Admission: RE | Admit: 2016-06-11 | Payer: Medicare HMO | Source: Ambulatory Visit | Admitting: Cardiovascular Disease

## 2016-06-11 ENCOUNTER — Encounter (HOSPITAL_COMMUNITY)
Admission: AD | Disposition: A | Discharge status: Home / Self Care | Payer: Self-pay | Source: Ambulatory Visit | Attending: Internal Medicine

## 2016-06-11 ENCOUNTER — Encounter (HOSPITAL_COMMUNITY): Payer: Self-pay | Admitting: General Practice

## 2016-06-11 DIAGNOSIS — I200 Unstable angina: Secondary | ICD-10-CM

## 2016-06-11 DIAGNOSIS — E785 Hyperlipidemia, unspecified: Secondary | ICD-10-CM

## 2016-06-11 DIAGNOSIS — I2510 Atherosclerotic heart disease of native coronary artery without angina pectoris: Secondary | ICD-10-CM | POA: Diagnosis not present

## 2016-06-11 DIAGNOSIS — I6523 Occlusion and stenosis of bilateral carotid arteries: Secondary | ICD-10-CM

## 2016-06-11 DIAGNOSIS — E119 Type 2 diabetes mellitus without complications: Secondary | ICD-10-CM | POA: Diagnosis not present

## 2016-06-11 DIAGNOSIS — I2582 Chronic total occlusion of coronary artery: Secondary | ICD-10-CM | POA: Diagnosis not present

## 2016-06-11 DIAGNOSIS — I10 Essential (primary) hypertension: Secondary | ICD-10-CM

## 2016-06-11 DIAGNOSIS — R69 Illness, unspecified: Secondary | ICD-10-CM | POA: Diagnosis not present

## 2016-06-11 DIAGNOSIS — Z951 Presence of aortocoronary bypass graft: Secondary | ICD-10-CM | POA: Diagnosis not present

## 2016-06-11 DIAGNOSIS — Z7982 Long term (current) use of aspirin: Secondary | ICD-10-CM | POA: Diagnosis not present

## 2016-06-11 DIAGNOSIS — E7800 Pure hypercholesterolemia, unspecified: Secondary | ICD-10-CM | POA: Diagnosis not present

## 2016-06-11 DIAGNOSIS — Z720 Tobacco use: Secondary | ICD-10-CM

## 2016-06-11 DIAGNOSIS — R079 Chest pain, unspecified: Secondary | ICD-10-CM | POA: Diagnosis not present

## 2016-06-11 HISTORY — PX: CARDIAC CATHETERIZATION: SHX172

## 2016-06-11 LAB — BASIC METABOLIC PANEL
ANION GAP: 7 (ref 5–15)
BUN: 13 mg/dL (ref 6–20)
CALCIUM: 9.3 mg/dL (ref 8.9–10.3)
CO2: 21 mmol/L — AB (ref 22–32)
CREATININE: 0.98 mg/dL (ref 0.44–1.00)
Chloride: 112 mmol/L — ABNORMAL HIGH (ref 101–111)
GFR calc Af Amer: >60 mL/min (ref >60)
GLUCOSE: 102 mg/dL — AB (ref 65–99)
Potassium: 4.1 mmol/L (ref 3.5–5.1)
Sodium: 140 mmol/L (ref 135–145)

## 2016-06-11 LAB — GLUCOSE, CAPILLARY
GLUCOSE-CAPILLARY: 108 mg/dL — AB (ref 65–99)
GLUCOSE-CAPILLARY: 79 mg/dL (ref 65–99)
Glucose-Capillary: 104 mg/dL — ABNORMAL HIGH (ref 65–99)
Glucose-Capillary: 91 mg/dL (ref 65–99)

## 2016-06-11 LAB — CBC
HEMATOCRIT: 37.2 % (ref 36.0–46.0)
HEMOGLOBIN: 12.1 g/dL (ref 12.0–15.0)
MCH: 30 pg (ref 26.0–34.0)
MCHC: 32.5 g/dL (ref 30.0–36.0)
MCV: 92.3 fL (ref 78.0–100.0)
Platelets: 206 10*3/uL (ref 150–400)
RBC: 4.03 MIL/uL (ref 3.87–5.11)
RDW: 12.8 % (ref 11.5–15.5)
WBC: 6.4 10*3/uL (ref 4.0–10.5)

## 2016-06-11 LAB — LIPID PANEL
CHOL/HDL RATIO: 4.5 ratio
Cholesterol: 216 mg/dL — ABNORMAL HIGH (ref 0–200)
HDL: 48 mg/dL (ref >40)
LDL CALC: 151 mg/dL — AB (ref 0–99)
TRIGLYCERIDES: 86 mg/dL (ref <150)
VLDL: 17 mg/dL (ref 0–40)

## 2016-06-11 LAB — POCT ACTIVATED CLOTTING TIME: Activated Clotting Time: 131 seconds

## 2016-06-11 LAB — TROPONIN I
Troponin I: <0.03 ng/mL (ref <0.03)
Troponin I: <0.03 ng/mL (ref <0.03)

## 2016-06-11 LAB — HEMOGLOBIN A1C
Hgb A1c MFr Bld: 6.2 % — ABNORMAL HIGH (ref 4.8–5.6)
MEAN PLASMA GLUCOSE: 131 mg/dL

## 2016-06-11 SURGERY — LEFT HEART CATH AND CORS/GRAFTS ANGIOGRAPHY
Anesthesia: LOCAL

## 2016-06-11 MED ORDER — SODIUM CHLORIDE 0.9 % IV SOLN: INTRAVENOUS | Status: AC

## 2016-06-11 MED ORDER — NITROGLYCERIN 0.4 MG SL SUBL: 0.4000 mg | SUBLINGUAL_TABLET | SUBLINGUAL | 4 refills | Status: DC | PRN

## 2016-06-11 MED ORDER — FENTANYL CITRATE (PF) 100 MCG/2ML IJ SOLN
INTRAMUSCULAR | Status: AC
  Filled 2016-06-11: qty 2

## 2016-06-11 MED ORDER — FENTANYL CITRATE (PF) 100 MCG/2ML IJ SOLN
INTRAMUSCULAR | Status: DC | PRN
  Administered 2016-06-11: 25 ug via INTRAVENOUS

## 2016-06-11 MED ORDER — MIDAZOLAM HCL 2 MG/2ML IJ SOLN
INTRAMUSCULAR | Status: AC
  Filled 2016-06-11: qty 2

## 2016-06-11 MED ORDER — MIDAZOLAM HCL 2 MG/2ML IJ SOLN
INTRAMUSCULAR | Status: DC | PRN
  Administered 2016-06-11: 2 mg via INTRAVENOUS

## 2016-06-11 MED ORDER — LIDOCAINE HCL (PF) 1 % IJ SOLN
INTRAMUSCULAR | Status: DC | PRN
  Administered 2016-06-11: 20 mL

## 2016-06-11 MED ORDER — HEPARIN (PORCINE) IN NACL 2-0.9 UNIT/ML-% IJ SOLN
INTRAMUSCULAR | Status: AC
  Filled 2016-06-11: qty 1000

## 2016-06-11 MED ORDER — ACETAMINOPHEN 325 MG PO TABS: 650.0000 mg | ORAL_TABLET | ORAL | Status: DC | PRN

## 2016-06-11 MED ORDER — LEVETIRACETAM 500 MG PO TABS: 500.0000 mg | ORAL_TABLET | Freq: Two times a day (BID) | ORAL | 5 refills | Status: DC

## 2016-06-11 MED ORDER — HEPARIN (PORCINE) IN NACL 2-0.9 UNIT/ML-% IJ SOLN
INTRAMUSCULAR | Status: DC | PRN
  Administered 2016-06-11: 1000 mL

## 2016-06-11 MED ORDER — PNEUMOCOCCAL VAC POLYVALENT 25 MCG/0.5ML IJ INJ
0.5000 mL | INJECTION | Freq: Once | INTRAMUSCULAR | Status: AC
  Administered 2016-06-11: 0.5 mL via INTRAMUSCULAR
  Filled 2016-06-11: qty 0.5

## 2016-06-11 MED ORDER — IOPAMIDOL (ISOVUE-370) INJECTION 76%
INTRAVENOUS | Status: DC | PRN
  Administered 2016-06-11: 125 mL via INTRA_ARTERIAL

## 2016-06-11 MED ORDER — LIDOCAINE HCL (PF) 1 % IJ SOLN
INTRAMUSCULAR | Status: AC
  Filled 2016-06-11: qty 30

## 2016-06-11 SURGICAL SUPPLY — 7 items
CATH INFINITI 5FR MULTPACK ANG (CATHETERS) ×2 IMPLANT
KIT HEART LEFT (KITS) ×2 IMPLANT
PACK CARDIAC CATHETERIZATION (CUSTOM PROCEDURE TRAY) ×2 IMPLANT
SHEATH PINNACLE 5F 10CM (SHEATH) ×2 IMPLANT
SYR MEDRAD MARK V 150ML (SYRINGE) ×2 IMPLANT
TRANSDUCER W/STOPCOCK (MISCELLANEOUS) ×2 IMPLANT
WIRE .035 3MM-J 145CM (WIRE) ×2 IMPLANT

## 2016-06-11 NOTE — Progress Notes (Signed)
Patient Name: Samantha Huffman Date of Encounter: 06/11/2016  Primary Cardiologist: Dr. Sundra Aland Problem List     Active Problems:   Dyslipidemia   Coronary atherosclerosis of native coronary artery   Essential hypertension   Carotid artery stenosis, asymptomatic   Unstable angina (HCC)   Tobacco abuse     Subjective   Feeling well. Denies chest pain or shortness of breath.   Inpatient Medications    Scheduled Meds: . amLODipine  5 mg Oral Daily  . aspirin EC  81 mg Oral Daily  . atorvastatin  80 mg Oral q1800  . clopidogrel  75 mg Oral Daily  . escitalopram  10 mg Oral Daily  . insulin aspart  0-9 Units Subcutaneous TID WC  . levETIRAcetam  500 mg Oral BID  . lisinopril  10 mg Oral Daily  . metoprolol succinate  25 mg Oral Daily  . sodium chloride flush  3 mL Intravenous Q12H  . topiramate  75 mg Oral BID   Continuous Infusions: . sodium chloride 1 mL/kg/hr (06/11/16 0730)  . heparin 750 Units/hr (06/10/16 1849)  . nitroGLYCERIN 5 mcg/min (06/10/16 1848)   PRN Meds: sodium chloride, acetaminophen, nitroGLYCERIN, ondansetron (ZOFRAN) IV, sodium chloride flush   Vital Signs    Vitals:   06/11/16 0035 06/11/16 0455 06/11/16 0845 06/11/16 1009  BP: (!) 115/55 106/60 (!) 115/57 (!) 124/58  Pulse: (!) 54 70 (!) 54 60  Resp: 12 16 18 14   Temp: 98 F (36.7 C) 98.1 F (36.7 C) 98.2 F (36.8 C)   TempSrc: Oral Oral Oral   SpO2: 97% 97% 100%   Weight:  61.7 kg (136 lb)      Intake/Output Summary (Last 24 hours) at 06/11/16 1030 Last data filed at 06/11/16 1000  Gross per 24 hour  Intake           414.18 ml  Output             1500 ml  Net         -1085.82 ml   Filed Weights   06/11/16 0455  Weight: 61.7 kg (136 lb)    Physical Exam    GEN: Well nourished, well developed, in no acute distress.  HEENT: Grossly normal.  Neck: Supple, no JVD, carotid bruits, or masses. Cardiac: RRR, no murmurs, rubs, or gallops. No clubbing, cyanosis, edema.   Radials/DP/PT 2+ and equal bilaterally.  Respiratory:  Respirations regular and unlabored, clear to auscultation bilaterally. GI: Soft, nontender, nondistended, BS + x 4. MS: no deformity or atrophy. Skin: warm and dry, no rash. Neuro:  Strength and sensation are intact. Psych: AAOx3.  Normal affect.  Labs    CBC  Recent Labs  06/10/16 1848 06/11/16 0521  WBC 6.8 6.4  NEUTROABS 3.8  --   HGB 12.4 12.1  HCT 37.5 37.2  MCV 91.0 92.3  PLT 237 [id number]   Basic Metabolic Panel  Recent Labs  06/10/16 1848 06/11/16 0521  NA 141 140  K 4.2 4.1  CL 111 112*  CO2 21* 21*  GLUCOSE 85 102*  BUN 13 13  CREATININE 0.92 0.98  CALCIUM 9.4 9.3   Liver Function Tests  Recent Labs  06/10/16 1848  AST 21  ALT 17  ALKPHOS 124  BILITOT 0.4  PROT 6.8  ALBUMIN 3.9   No results for input(s): LIPASE, AMYLASE in the last 72 hours. Cardiac Enzymes  Recent Labs  06/10/16 1848 06/10/16 2232 06/11/16 0521  TROPONINI <0.03 <0.03 <0.03  BNP Invalid input(s): POCBNP D-Dimer No results for input(s): DDIMER in the last 72 hours. Hemoglobin A1C  Recent Labs  06/10/16 1848  HGBA1C 6.2*   Fasting Lipid Panel  Recent Labs  06/11/16 0521  CHOL 216*  HDL 48  LDLCALC 151*  TRIG 86  CHOLHDL 4.5   Thyroid Function Tests No results for input(s): TSH, T4TOTAL, T3FREE, THYROIDAB in the last 72 hours.  Invalid input(s): FREET3  Telemetry    Sinus rhythm.  No events.  - Personally Reviewed  ECG    Sinus bradycardia rate 54 bpm.  Diffuse TWI.  - Personally Reviewed  Radiology    No results found.  Cardiac Studies   Echo 11/16/12: LVEF 0000000, Grade 1 diastolic dysfunction. Moderate mitral annular calcification. Mild mitral regurgitation. Trace tricuspid regurgitation.  06/08/16 nuclear stress: "very small sized reversible defect of mild severity present in apical anterior wall, typical of LAD distribution, low risk, EF 69%, compared to study in 10/2012 no significant  change - similar defect described at that time but was larger in size."   Patient Profile     38F with CAD s/p CABG, hypertension, hyperlipidemia, diabetes and carotid stenosis here with chest pain and a recent abnormal stress.    Assessment & Plan    # Abnormal stress: # Chest pain:  Samantha Huffman is currently chest pain free. She is s/p CABG and her anatomy is unknown.  Plan for LHC today.  Continue heparin and nitroglycerin drip.  Troponin has been negative.  Continue metoprolol, aspirin, clopidogrel, and atorvastatin.   # Hyperlipidemia: Patient reports that she has been taking atorvastatin, however it was not on her medication list and her LDL was 151 on admission. It was 6810 months ago. We have reordered atorvastatin and her lipids will need to be rechecked in 3 months.   # Hypertension:  Blood pressure well controlled. Continue amlodipine, metoprolol, lisinopril.  # Tobacco abuse: Continue to recommend smoking cessation.   # Carotid stenosis: 50-70% bilaterally.  Medical management as above.   Signed, Skeet Latch, MD  06/11/2016, 10:30 AM

## 2016-06-11 NOTE — Discharge Summary (Signed)
Physician Discharge Summary       Patient ID: Samantha Huffman MRN: [id number] DOB/AGE: 02-18-1956 60 y.o.  Admit date: 06/10/2016 Discharge date: 06/11/2016 Primary Cardiologist:Dr. Turner   Discharge Diagnoses:  Principal Problem:   Chest pain, neg MI, patent grafts Active Problems:   Dyslipidemia   Coronary atherosclerosis of native coronary artery   Essential hypertension   Carotid artery stenosis, asymptomatic   Tobacco abuse   Discharged Condition: good  Procedures: 06/11/16 cardiac cath with Dr. Burt Knack Procedures   Left Heart Cath and Cors/Grafts Angiography  Conclusion   1. Moderate left main stenosis, mild LCx and RCA stenoses, and total occlusion of the mid-LAD 2. S/P CABG with continued patency of the LIMA-LAD and free radial to OM grafts 3. Normal LV function  Suspect non-cardiac chest pain. Continue medical therapy for CAD. Should be ok for discharge home today after her post-procedure care.     Hospital Course: 60 y.o. female with history of CAD s/p CABG 10/2012 in [place], dyslipidemia, HTN, DM, carotid artery stenosis (1-39% 07/2015 - due 2018), seizures, mild memory deficit who presented to office for evaluation of chest pain.  She had been having intermittent chest pain but with trip to [place] the pain increased.  Associated with SOB.  She was seen in ER there and admitted for OBS.  No MI stress test 06/08/16 showing "very small sized reversible defect of mild severity present in apical anterior wall, typical of LAD distribution, low risk, EF 69%, compared to study in 10/2012 no significant change.  In office yeaterday she stated pain continues at times.   EKG shows new inf TW changes.  She was admitted and plan for cath today.    Troponins have been negative. < 0.03  LDL is elevated at 151, hgbA1C is 6.2   Tele:  SR to SB in 27s.    Grafts are patent on cath and pt will be d/c'd home once recovered from cath.  We have discussed smoking cessation.   She  will need lipids checked in 3 months.   Post cath pt ambulated without problems.  She will follow up with Dr. Radford Pax 07/23/16 though if further problems prior to that time she will call.      Consults: None  Significant Diagnostic Studies:  BMP Latest Ref Rng & Units 06/11/2016 06/10/2016 09/15/2015  Glucose 65 - 99 mg/dL 102(H) 85 100(H)  BUN 6 - 20 mg/dL _0 Creatinine 0.44 - 1.00 mg/dL 0.98 0.92 0.76  BUN/Creat Ratio 9 - 23 - - 13  Sodium 135 - 145 mmol/L 140 141 138  Potassium 3.5 - 5.1 mmol/L 4.1 4.2 4.7  Chloride 101 - 111 mmol/L 112(H) 111 98  CO2 22 - 32 mmol/L 21(L) 21(L) 23  Calcium 8.9 - 10.3 mg/dL 9.3 9.4 9.4   Hepatic Function Latest Ref Rng & Units 06/10/2016 09/15/2015 08/06/2015  Total Protein 6.5 - 8.1 g/dL 6.8 7.4 -  Albumin 3.5 - 5.0 g/dL 3.9 4.4 -  AST 15 - 41 U/L 21 18 -  ALT 14 - 54 U/L _1 Alk Phosphatase 38 - 126 U/L 124 121(H) -  Total Bilirubin 0.3 - 1.2 mg/dL 0.4 0.3 -    Lipid Panel     Component Value Date/Time   CHOL 216 (H) 06/11/2016 0521   TRIG 86 06/11/2016 0521   HDL 48 06/11/2016 0521   CHOLHDL 4.5 06/11/2016 0521   VLDL 17 06/11/2016 0521   LDLCALC 151 (H) 06/11/2016 1583  CBC Latest Ref Rng & Units 06/11/2016 06/10/2016 09/15/2015  WBC 4.0 - 10.5 K/uL 6.4 6.8 5.1  Hemoglobin 12.0 - 15.0 g/dL 12.1 12.4 -  Hematocrit 36.0 - 46.0 % 37.2 37.5 40.7  Platelets 150 - 400 K/uL 206 237 208    tropnoin < 0.03 X 3  Discharge Exam: Blood pressure 100/83, pulse 62, temperature 97.9 F (36.6 C), temperature source Oral, resp. rate 14, weight 136 lb (61.7 kg), SpO2 100 %.  Disposition: 01-Home or Self Care     Medication List    TAKE these medications   acetaminophen 325 MG tablet Commonly known as:  TYLENOL Take 2 tablets (650 mg total) by mouth every 4 (four) hours as needed for headache or mild pain.   amlodipine-atorvastatin 5-80 MG tablet Commonly known as:  CADUET TAKE 1 TABLET BY MOUTH ONCE DAILY   aspirin 81  MG tablet Take 81 mg by mouth daily.   clopidogrel 75 MG tablet Commonly known as:  PLAVIX TAKE ONE TABLET BY MOUTH DAILY   escitalopram 10 MG tablet Commonly known as:  LEXAPRO Take 10 mg by mouth daily.   levETIRAcetam 500 MG tablet Commonly known as:  KEPPRA Take 1 tablet (500 mg total) by mouth 2 (two) times daily. What changed:  See the new instructions.   lisinopril 10 MG tablet Commonly known as:  PRINIVIL,ZESTRIL Take 10 mg by mouth daily.   metoprolol succinate 25 MG 24 hr tablet Commonly known as:  TOPROL-XL Take 25 mg by mouth daily.   nitroGLYCERIN 0.4 MG SL tablet Commonly known as:  NITROSTAT Place 1 tablet (0.4 mg total) under the tongue every 5 (five) minutes x 3 doses as needed for chest pain.   topiramate 25 MG tablet Commonly known as:  TOPAMAX Take 3 tablets (75 mg total) by mouth 2 (two) times daily.      Follow-up Information    Fransico Him, MD .   Specialty:  Cardiology Why:   keep appt Dec 1st, call if problems prior to that time.  Contact information: [address] [place] [address] [phone]            Discharge Instructions: Call [facility] at [phone] if any bleeding, swelling or drainage at cath site.  May shower, no tub baths for 48 hours for groin sticks. No lifting over 5 pounds for 3 days.  No Driving for 3 days  Take 1 NTG, under your tongue, while sitting.  If no relief of pain may repeat NTG, one tab every 5 minutes up to 3 tablets total over 15 minutes.  If no relief CALL 911.  If you have dizziness/lightheadness  while taking NTG, stop taking and call 911.         Heart Healthy Diet  Call if any questions or concerns.    Signed: Cecilie Kicks Nurse Practitioner-Certified [facility]: [facility] 06/11/2016, 6:45 PM  Time spent on discharge : > 30 minutes.

## 2016-06-11 NOTE — Care Management Obs Status (Signed)
[place] NOTIFICATION   Patient Details  Name: Samantha Huffman MRN: [id number] Date of Birth: Sep 24, 1955   Medicare Observation Status Notification Given:  Yes    Bethena Roys, RN 06/11/2016, 11:58 AM

## 2016-06-11 NOTE — Progress Notes (Signed)
ANTICOAGULATION CONSULT NOTE - Follow-up Consult  Pharmacy Consult for heparin  Indication: chest pain/ACS  Allergies  Allergen Reactions  . Depakote [Divalproex Sodium] Other (See Comments)    Hyperammonemia  . Penicillins     Other reaction(s): GI intolerance Has patient had a PCN reaction causing immediate rash, facial/tongue/throat swelling, SOB or lightheadedness with hypotension: YES Has patient had a PCN reaction causing severe rash involving mucus membranes or skin necrosis: NO Has patient had a PCN reaction that required hospitalizationNO Has patient had a PCN reaction occurring within the last 10 years: NO If all of the above answers are "NO", then may proceed with Cephalosporin use.    Patient Measurements:   Heparin Dosing Weight: 63 kg  Vital Signs: Temp: 98 F (36.7 C) (10/20 0035) Temp Source: Oral (10/20 0035) BP: 115/55 (10/20 0035) Pulse Rate: 54 (10/20 0035)  Labs:  Recent Labs  06/10/16 1848 06/10/16 2232  HGB 12.4  --   HCT 37.5  --   PLT 237  --   LABPROT 14.7  --   INR 1.14  --   HEPARINUNFRC  --  0.49  CREATININE 0.92  --   TROPONINI <0.03 <0.03    Estimated Creatinine Clearance: 55.3 mL/min (by C-G formula based on SCr of 0.92 mg/dL).   Assessment: 22 YOF on heparin for r/o ACS. Heparin level therapeutic (0.49) on gtt at 750 units/hr. Noted possible cath in AM. No bleeding noted.  Goal of Therapy:  Heparin level 0.3-0.7 units/ml Monitor platelets by anticoagulation protocol: Yes   Plan:  Continue heparin infusion at 750 units/hr F/u confirmatory heparin level with a.m. labs  Sherlon Handing, PharmD, BCPS Clinical pharmacist, pager [phone] 06/11/2016,12:36 AM

## 2016-06-11 NOTE — Interval H&P Note (Signed)
Cath Lab Visit (complete for each Cath Lab visit)  Clinical Evaluation Leading to the Procedure:   ACS: Yes.    Non-ACS:    Anginal Classification: CCS IV  Anti-ischemic medical therapy: Maximal Therapy (2 or more classes of medications)  Non-Invasive Test Results: No non-invasive testing performed  Prior CABG: Previous CABG      History and Physical Interval Note:  06/11/2016 12:46 PM  Samantha Huffman  has presented today for surgery, with the diagnosis of cp  The various methods of treatment have been discussed with the patient and family. After consideration of risks, benefits and other options for treatment, the patient has consented to  Procedure(s): Left Heart Cath and Cors/Grafts Angiography (N/A) as a surgical intervention .  The patient's history has been reviewed, patient examined, no change in status, stable for surgery.  I have reviewed the patient's chart and labs.  Questions were answered to the patient's satisfaction.     Sherren Mocha

## 2016-06-11 NOTE — Discharge Instructions (Signed)
Call [facility] at [phone] if any bleeding, swelling or drainage at cath site.  May shower, no tub baths for 48 hours for groin sticks. No lifting over 5 pounds for 3 days.  No Driving for 3 days  Take 1 NTG, under your tongue, while sitting.  If no relief of pain may repeat NTG, one tab every 5 minutes up to 3 tablets total over 15 minutes.  If no relief CALL 911.  If you have dizziness/lightheadness  while taking NTG, stop taking and call 911.         Heart Healthy Diet  Call if any questions or concerns.

## 2016-06-11 NOTE — Progress Notes (Addendum)
Site area: RFA Site Prior to Removal:  Level 0 Pressure Applied For: 27 min Manual: yes   Patient Status During Pull:  stable Post Pull Site:  Level 0 Post Pull Instructions Given: yes  Post Pull Pulses Present: palpable Dressing Applied:  tegaderm Bedrest begins @ 1430 till 1830 Comments:

## 2016-06-11 NOTE — Progress Notes (Signed)
Pt d/c home in no acute distress.  Bedrest over at 1830.  Pt ambulated without assistance to bathroom and back to bed.  Pt stated she feels good, just a little drowsy.  Bandaid applied to femoral site (Right Groin).  Pinpoint amount of blood on bandaid.  Pt agrees to d/c instructions.  Rx sent to [facility].  Pt aware.  Post Cath/Groin Care was reviewed in great detail with patient.  Awaiting to be picked up by daughters.

## 2016-06-11 NOTE — H&P (View-Only) (Signed)
Patient Name: Samantha Huffman Date of Encounter: 06/11/2016  Primary Cardiologist: Dr. Sundra Aland Problem List     Active Problems:   Dyslipidemia   Coronary atherosclerosis of native coronary artery   Essential hypertension   Carotid artery stenosis, asymptomatic   Unstable angina (HCC)   Tobacco abuse     Subjective   Feeling well. Denies chest pain or shortness of breath.   Inpatient Medications    Scheduled Meds: . amLODipine  5 mg Oral Daily  . aspirin EC  81 mg Oral Daily  . atorvastatin  80 mg Oral q1800  . clopidogrel  75 mg Oral Daily  . escitalopram  10 mg Oral Daily  . insulin aspart  0-9 Units Subcutaneous TID WC  . levETIRAcetam  500 mg Oral BID  . lisinopril  10 mg Oral Daily  . metoprolol succinate  25 mg Oral Daily  . sodium chloride flush  3 mL Intravenous Q12H  . topiramate  75 mg Oral BID   Continuous Infusions: . sodium chloride 1 mL/kg/hr (06/11/16 0730)  . heparin 750 Units/hr (06/10/16 1849)  . nitroGLYCERIN 5 mcg/min (06/10/16 1848)   PRN Meds: sodium chloride, acetaminophen, nitroGLYCERIN, ondansetron (ZOFRAN) IV, sodium chloride flush   Vital Signs    Vitals:   06/11/16 0035 06/11/16 0455 06/11/16 0845 06/11/16 1009  BP: (!) 115/55 106/60 (!) 115/57 (!) 124/58  Pulse: (!) 54 70 (!) 54 60  Resp: 12 16 18 14   Temp: 98 F (36.7 C) 98.1 F (36.7 C) 98.2 F (36.8 C)   TempSrc: Oral Oral Oral   SpO2: 97% 97% 100%   Weight:  61.7 kg (136 lb)      Intake/Output Summary (Last 24 hours) at 06/11/16 1030 Last data filed at 06/11/16 1000  Gross per 24 hour  Intake           414.18 ml  Output             1500 ml  Net         -1085.82 ml   Filed Weights   06/11/16 0455  Weight: 61.7 kg (136 lb)    Physical Exam    GEN: Well nourished, well developed, in no acute distress.  HEENT: Grossly normal.  Neck: Supple, no JVD, carotid bruits, or masses. Cardiac: RRR, no murmurs, rubs, or gallops. No clubbing, cyanosis, edema.   Radials/DP/PT 2+ and equal bilaterally.  Respiratory:  Respirations regular and unlabored, clear to auscultation bilaterally. GI: Soft, nontender, nondistended, BS + x 4. MS: no deformity or atrophy. Skin: warm and dry, no rash. Neuro:  Strength and sensation are intact. Psych: AAOx3.  Normal affect.  Labs    CBC  Recent Labs  06/10/16 1848 06/11/16 0521  WBC 6.8 6.4  NEUTROABS 3.8  --   HGB 12.4 12.1  HCT 37.5 37.2  MCV 91.0 92.3  PLT 237 99991111   Basic Metabolic Panel  Recent Labs  06/10/16 1848 06/11/16 0521  NA 141 140  K 4.2 4.1  CL 111 112*  CO2 21* 21*  GLUCOSE 85 102*  BUN 13 13  CREATININE 0.92 0.98  CALCIUM 9.4 9.3   Liver Function Tests  Recent Labs  06/10/16 1848  AST 21  ALT 17  ALKPHOS 124  BILITOT 0.4  PROT 6.8  ALBUMIN 3.9   No results for input(s): LIPASE, AMYLASE in the last 72 hours. Cardiac Enzymes  Recent Labs  06/10/16 1848 06/10/16 2232 06/11/16 0521  TROPONINI <0.03 <0.03 <0.03  BNP Invalid input(s): POCBNP D-Dimer No results for input(s): DDIMER in the last 72 hours. Hemoglobin A1C  Recent Labs  06/10/16 1848  HGBA1C 6.2*   Fasting Lipid Panel  Recent Labs  06/11/16 0521  CHOL 216*  HDL 48  LDLCALC 151*  TRIG 86  CHOLHDL 4.5   Thyroid Function Tests No results for input(s): TSH, T4TOTAL, T3FREE, THYROIDAB in the last 72 hours.  Invalid input(s): FREET3  Telemetry    Sinus rhythm.  No events.  - Personally Reviewed  ECG    Sinus bradycardia rate 54 bpm.  Diffuse TWI.  - Personally Reviewed  Radiology    No results found.  Cardiac Studies   Echo 11/16/12: LVEF 0000000, Grade 1 diastolic dysfunction. Moderate mitral annular calcification. Mild mitral regurgitation. Trace tricuspid regurgitation.  06/08/16 nuclear stress: "very small sized reversible defect of mild severity present in apical anterior wall, typical of LAD distribution, low risk, EF 69%, compared to study in 10/2012 no significant  change - similar defect described at that time but was larger in size."   Patient Profile     33F with CAD s/p CABG, hypertension, hyperlipidemia, diabetes and carotid stenosis here with chest pain and a recent abnormal stress.    Assessment & Plan    # Abnormal stress: # Chest pain:  Samantha Huffman is currently chest pain free. She is s/p CABG and her anatomy is unknown.  Plan for LHC today.  Continue heparin and nitroglycerin drip.  Troponin has been negative.  Continue metoprolol, aspirin, clopidogrel, and atorvastatin.   # Hyperlipidemia: Patient reports that she has been taking atorvastatin, however it was not on her medication list and her LDL was 151 on admission. It was 6810 months ago. We have reordered atorvastatin and her lipids will need to be rechecked in 3 months.   # Hypertension:  Blood pressure well controlled. Continue amlodipine, metoprolol, lisinopril.  # Tobacco abuse: Continue to recommend smoking cessation.   # Carotid stenosis: 50-70% bilaterally.  Medical management as above.   Signed, Skeet Latch, MD  06/11/2016, 10:30 AM

## 2016-06-14 ENCOUNTER — Encounter (HOSPITAL_COMMUNITY): Payer: Self-pay | Admitting: Cardiovascular Disease

## 2016-07-17 DIAGNOSIS — N760 Acute vaginitis: Secondary | ICD-10-CM | POA: Diagnosis not present

## 2016-07-17 DIAGNOSIS — B9689 Other specified bacterial agents as the cause of diseases classified elsewhere: Secondary | ICD-10-CM | POA: Diagnosis not present

## 2016-07-17 DIAGNOSIS — R8290 Unspecified abnormal findings in urine: Secondary | ICD-10-CM | POA: Diagnosis not present

## 2016-07-17 DIAGNOSIS — N898 Other specified noninflammatory disorders of vagina: Secondary | ICD-10-CM | POA: Diagnosis not present

## 2016-07-23 ENCOUNTER — Ambulatory Visit: Payer: Medicare HMO | Admitting: Cardiology

## 2016-07-26 DIAGNOSIS — Z23 Encounter for immunization: Secondary | ICD-10-CM | POA: Diagnosis not present

## 2016-08-09 ENCOUNTER — Ambulatory Visit (INDEPENDENT_AMBULATORY_CARE_PROVIDER_SITE_OTHER): Payer: Medicare HMO | Admitting: Cardiology

## 2016-08-09 ENCOUNTER — Encounter (INDEPENDENT_AMBULATORY_CARE_PROVIDER_SITE_OTHER): Payer: Self-pay

## 2016-08-09 VITALS — BP 112/74 | HR 69 | Resp 18 | Wt 142.0 lb

## 2016-08-09 DIAGNOSIS — I272 Pulmonary hypertension, unspecified: Secondary | ICD-10-CM

## 2016-08-09 DIAGNOSIS — I10 Essential (primary) hypertension: Secondary | ICD-10-CM | POA: Diagnosis not present

## 2016-08-09 DIAGNOSIS — I2510 Atherosclerotic heart disease of native coronary artery without angina pectoris: Secondary | ICD-10-CM | POA: Diagnosis not present

## 2016-08-09 DIAGNOSIS — E785 Hyperlipidemia, unspecified: Secondary | ICD-10-CM

## 2016-08-09 DIAGNOSIS — I6523 Occlusion and stenosis of bilateral carotid arteries: Secondary | ICD-10-CM | POA: Diagnosis not present

## 2016-08-09 MED ORDER — ISOSORBIDE MONONITRATE ER 30 MG PO TB24: 15.0000 mg | ORAL_TABLET | Freq: Every day | ORAL | 11 refills | Status: DC

## 2016-08-09 MED ORDER — CLOPIDOGREL BISULFATE 75 MG PO TABS: 75.0000 mg | ORAL_TABLET | Freq: Every day | ORAL | 11 refills | Status: DC

## 2016-08-09 MED ORDER — LISINOPRIL 10 MG PO TABS: 10.0000 mg | ORAL_TABLET | Freq: Every day | ORAL | 11 refills | Status: DC

## 2016-08-09 MED ORDER — EZETIMIBE 10 MG PO TABS: 10.0000 mg | ORAL_TABLET | Freq: Every day | ORAL | 11 refills | Status: DC

## 2016-08-09 MED ORDER — AMLODIPINE-ATORVASTATIN 5-80 MG PO TABS: 1.0000 | ORAL_TABLET | Freq: Every day | ORAL | 11 refills | Status: DC

## 2016-08-09 MED ORDER — METOPROLOL SUCCINATE ER 25 MG PO TB24: 25.0000 mg | ORAL_TABLET | Freq: Every day | ORAL | 11 refills | Status: DC

## 2016-08-09 NOTE — Patient Instructions (Addendum)
Medication Instructions:  1) START ZETIA 10 mg daily 2) START IMDUR 15 mg daily  Labwork: Your physician recommends that you return for FASTING lab work in [place].  Testing/Procedures: None  Follow-Up: Your physician recommends that you schedule a follow-up appointment in 2 WEEKS with Dr. Theodosia Blender assistant.     Your physician wants you to follow-up in: 1 year with Dr. Radford Pax. You will receive a reminder letter in the mail two months in advance. If you don't receive a letter, please call our office to schedule the follow-up appointment.   Any Other Special Instructions Will Be Listed Below (If Applicable).     If you need a refill on your cardiac medications before your next appointment, please call your pharmacy.

## 2016-08-09 NOTE — Progress Notes (Signed)
Cardiology Office Note    Date:  08/09/2016   ID:  Samantha Huffman, DOB Jan 20, 1956, MRN [id number]  PCP:  Samantha Coffin, MD  Cardiologist:  Samantha Him, MD   Chief Complaint  Patient presents with  . office visit  . Coronary Artery Disease  . Hypertension    History of Present Illness:  Samantha Huffman is a 60 y.o. female who presents for followup of CAD.  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  She is doing well.  She had some CP in October and had a nuclear stress test that showed ischemia but cath showed patent grafts to OM and LAD and 30% RCA.  She still gets some chest discomfort when she gets very upset.  She has a lot of stress.  She denies any SOB, DOE, palpitations, PND, orthopnea, claudication or syncope.  She occasionally has some right ankle and foot swelling.  She does get fatigued some.  Occasionally she will have some lightheadedness if she gets up too fast.      Past Medical History:  Diagnosis Date  . Carotid artery stenosis    (LEFT) 50-70% Right side is less then 50%. ICA stenosis  . Carotid artery stenosis, asymptomatic 07/30/2015   < 50% bilaterally 10/2014  . Coronary artery disease    a. s/p CABG 10/2012.  Marland Kitchen Dyslipidemia   . Family history of adverse reaction to anesthesia    "my mother throws up"  . Generalized convulsive epilepsy without mention of intractable epilepsy   . Hypercholesteremia   . Hypertension   . Memory deficit   . Migraine    "controlled on daily RX " (06/11/2016)  . Nephrolithiasis 07/30/2015   S/p lithotripsy x 3 and right and left ureter stents  . OSA (obstructive sleep apnea)    hx; was on CPAP; haven't needed it since OHS" (06/11/2016)  . PONV (postoperative nausea and vomiting)   . Pulmonary HTN 07/30/2015   Mild with PASP 35-71mmHg by echo 0000000 with diastolic dysfunction  . Seizures ([place])    "controlled w/daily RX; started in 2012; dr thinks they might be from the  migraines; last sz was early part of 2016" (06/11/2016)  . Type II diabetes mellitus ([place])    diet controlled (06/11/2016)  . Vasovagal syncope 07/30/2015   2011    Past Surgical History:  Procedure Laterality Date  . CARDIAC CATHETERIZATION  2014; 06/11/2016  . CARDIAC CATHETERIZATION N/A 06/11/2016   Procedure: Left Heart Cath and Cors/Grafts Angiography;  Surgeon: Sherren Mocha, MD;  Location: [facility];  Service: Cardiovascular;  Laterality: N/A;  . CESAREAN SECTION    . CORONARY ANGIOPLASTY    . CORONARY ARTERY BYPASS GRAFT  March, 2014   LIMA to LAD, left radial to LCx  . LITHOTRIPSY  X 3  . TOTAL ABDOMINAL HYSTERECTOMY    . TUBAL LIGATION    . URETERAL STENT PLACEMENT      Current Medications: Outpatient Medications Prior to Visit  Medication Sig Dispense Refill  . acetaminophen (TYLENOL) 325 MG tablet Take 2 tablets (650 mg total) by mouth every 4 (four) hours as needed for headache or mild pain.    Marland Kitchen amlodipine-atorvastatin (CADUET) 5-80 MG per tablet TAKE 1 TABLET BY MOUTH ONCE DAILY 30 tablet 1  . aspirin 81 MG tablet Take 81 mg by mouth daily.    . clopidogrel (PLAVIX) 75 MG tablet TAKE ONE TABLET BY MOUTH DAILY 30 tablet 9  .  escitalopram (LEXAPRO) 10 MG tablet Take 10 mg by mouth daily.    Marland Kitchen levETIRAcetam (KEPPRA) 500 MG tablet Take 1 tablet (500 mg total) by mouth 2 (two) times daily. 60 tablet 5  . lisinopril (PRINIVIL,ZESTRIL) 10 MG tablet Take 10 mg by mouth daily.    . metoprolol succinate (TOPROL-XL) 25 MG 24 hr tablet Take 25 mg by mouth daily.    . nitroGLYCERIN (NITROSTAT) 0.4 MG SL tablet Place 1 tablet (0.4 mg total) under the tongue every 5 (five) minutes x 3 doses as needed for chest pain. 25 tablet 4  . topiramate (TOPAMAX) 25 MG tablet Take 3 tablets (75 mg total) by mouth 2 (two) times daily. 540 tablet 3   No facility-administered medications prior to visit.      Allergies:   Depakote [divalproex sodium] and Penicillins   Social History    Social History  . Marital status: Divorced    Spouse name: N/A  . Number of children: 2  . Years of education: 12   Occupational History  . retired    Social History Main Topics  . Smoking status: Current Every Day Smoker    Packs/day: 0.33    Years: 35.00    Types: Cigarettes  . Smokeless tobacco: Former Systems developer    Quit date: 11/20/2012  . Alcohol use Yes     Comment: 06/11/2016 "might have a few drinks/month; usually wine"  . Drug use: No  . Sexual activity: Not Currently   Other Topics Concern  . Not on file   Social History Narrative   Patient drinks about 1 cup of coffee daily.   Patient is right handed.      Family History:  The patient's family history includes Cancer in her father; Diabetes in her brother, brother, and father; Heart attack in her sister; Heart disease in her brother and sister; Stroke in her brother.   ROS:   Please see the history of present illness.    ROS All other systems reviewed and are negative.  No flowsheet data found.     PHYSICAL EXAM:   VS:  BP 112/74   Pulse 69   Resp 18   Wt 142 lb (64.4 kg)   SpO2 98%   BMI 26.83 kg/m    GEN: Well nourished, well developed, in no acute distress  HEENT: normal  Neck: no JVD, carotid bruits, or masses Cardiac: RRR; no murmurs, rubs, or gallops,no edema.  Intact distal pulses bilaterally.  Respiratory:  clear to auscultation bilaterally, normal work of breathing GI: soft, nontender, nondistended, + BS MS: no deformity or atrophy  Skin: warm and dry, no rash Neuro:  Alert and Oriented x 3, Strength and sensation are intact Psych: euthymic mood, full affect  Wt Readings from Last 3 Encounters:  08/09/16 142 lb (64.4 kg)  06/11/16 136 lb (61.7 kg)  06/10/16 138 lb 12.8 oz (63 kg)      Studies/Labs Reviewed:   EKG:  EKG is not ordered today.    Recent Labs: 06/10/2016: ALT 17 06/11/2016: BUN 13; Creatinine, Ser 0.98; Hemoglobin 12.1; Platelets 206; Potassium 4.1; Sodium 140    Lipid Panel    Component Value Date/Time   CHOL 216 (H) 06/11/2016 0521   TRIG 86 06/11/2016 0521   HDL 48 06/11/2016 0521   CHOLHDL 4.5 06/11/2016 0521   VLDL 17 06/11/2016 0521   LDLCALC 151 (H) 06/11/2016 0521    Additional studies/ records that were reviewed today include:  none  ASSESSMENT:    1. Atherosclerosis of native coronary artery of native heart without angina pectoris   2. Essential hypertension   3. Pulmonary HTN   4. Asymptomatic bilateral carotid artery stenosis   5. Dyslipidemia      PLAN:  In order of problems listed above:  1. ASCAD s/p CABG in 2014 and cath with patent stents 05/2016.  She has noncardiac CP that occurs with stress and anxiety and resolves when she lays down.  This could be microvascular anginal disease but likely musculoskeletal/anxiety. I will add Imdur 15mg  daily. I encouraged her to see her PCP for treatment of her anxiety.  Continue ASA/Plavix/BB.  I will have her followup with PA in 2 weeks.  2. HTN - BP controlled on current meds. Continue BB/amlodipine/ACE I.   3. Pulmonary HTN -resolved on echo 2014 4. Mild asymptomatic bilateral carotid artery stenosis 1-39% - repeat dopplers in 1 year.  Continue ASA and statin.  5. Dyslipidemia - LDL goal < 70.  Her last LDL was 151.  Continue statin. Add zetia 10mg  daily and repeat FLP and ALT in 8 weeks.     Medication Adjustments/Labs and Tests Ordered: Current medicines are reviewed at length with the patient today.  Concerns regarding medicines are outlined above.  Medication changes, Labs and Tests ordered today are listed in the Patient Instructions below.  There are no Patient Instructions on file for this visit.   Signed, Samantha Him, MD  08/09/2016 10:38 AM    [facility] [place], [place], [place]  [address] Phone: [phone]; Fax: [phone]

## 2016-08-10 ENCOUNTER — Ambulatory Visit: Payer: Medicare HMO | Admitting: Cardiology

## 2016-08-25 DIAGNOSIS — N200 Calculus of kidney: Secondary | ICD-10-CM | POA: Diagnosis not present

## 2016-08-26 ENCOUNTER — Encounter: Payer: Self-pay | Admitting: Physician Assistant

## 2016-08-26 NOTE — Progress Notes (Signed)
Cardiology Office Note    Date:  08/27/2016  ID:  Samantha Huffman, DOB 1956-05-18, MRN [id number] PCP:  Donnie Coffin, MD  Cardiologist:  Dr. Radford Pax   Chief Complaint: chest pain follow-up  History of Present Illness:  Samantha Huffman is a 61 y.o. female with history of CAD s/p CABG 10/2012, dyslipidemia, HTN, DM, mild carotid artery stenosis, possible seizures, OSA, migraines, prior pulm HTN (resolved on 2014 echo). She had some CP in October and had a nuclear stress test that showed ischemia but cath showed patent grafts to OM and LAD and 30% RCA, normal LVEF, discomfort felt noncardiac. She still gets some chest discomfort when she gets very upset. At last OV 08/09/16, Dr. Radford Pax added low dose Imdur in case this represented microvascular anginal disease. She also recommended for patient to f/u PCP to discuss treatent of anxiety. Zetia was added to aid with high LDL. Last carotid 07/2015 - 1-39% stenosis (repeat due 07/2017). 2D echo 10/2012: EF 55-60%, mild MR, grade 1 DD. Labs 05/2016: Cr 0.98, K 4.1, CBC wnl.  She presents back for follow-up today stating she feels great. No further chest pain/pressure. No SOB. No palpitations or syncope. She plans on joining her daughter's neighbor for dance classes in the new year. She plans to gradually ease back into activity and knows to listen to her body and rest if needed. She points out her amlodipine/atorvastatin and says she thinks she was previously told by neurology not to take this. She said they told her they would put a note in her chart. I can find a note in 09/16/15 that references taking her off Depakote due to elevated alk phos and hyperammonemia but there is no reference in any of the notes to amlodipine/atorvastatin. She then thought perhaps it was her PCP who mentioned this. Our nurse called their office and per their notes about a year ago, her atorvastatin was held due to leg pain. However, she has since been taking this the last several months  (amlodipine/atorvastatin 92m/80mg) without any adverse side effects.   Past Medical History:  Diagnosis Date  . Carotid artery stenosis    a. Last carotid 07/2015 - 1-39% stenosis (repeat due 07/2017).  . Coronary artery disease    a. s/p CABG 10/2012.  .Marland KitchenDyslipidemia   . Generalized convulsive epilepsy without mention of intractable epilepsy   . Hypertension   . Memory deficit   . Migraine    "controlled on daily RX " (06/11/2016)  . Nephrolithiasis 07/30/2015   S/p lithotripsy x 3 and right and left ureter stents  . OSA (obstructive sleep apnea)    hx; was on CPAP; haven't needed it since OHS" (06/11/2016)  . PONV (postoperative nausea and vomiting)   . Pulmonary HTN 07/30/2015   Mild with PASP 35-437mg by echo 200923ith diastolic dysfunction  . [place]   "controlled w/daily RX; started in 2012; dr thinks they might be from the migraines; last sz was early part of 2016" (06/11/2016)  . [place] II diabetes mellitus ([place]   diet controlled (06/11/2016)  . Vasovagal syncope 07/30/2015   2011    Past Surgical History:  Procedure Laterality Date  . CARDIAC CATHETERIZATION  2014; 06/11/2016  . CARDIAC CATHETERIZATION N/A 06/11/2016   Procedure: Left Heart Cath and Cors/Grafts Angiography;  Surgeon: MiSherren MochaMD;  [facility];  Service: Cardiovascular;  Laterality: N/A;  . CESAREAN SECTION    . CORONARY ANGIOPLASTY    . CORONARY ARTERY  BYPASS GRAFT  March, 2014   LIMA to LAD, left radial to LCx  . LITHOTRIPSY  X 3  . TOTAL ABDOMINAL HYSTERECTOMY    . TUBAL LIGATION    . URETERAL STENT PLACEMENT      Current Medications: Current Outpatient Prescriptions  Medication Sig Dispense Refill  . acetaminophen (TYLENOL) 325 MG tablet Take 2 tablets (650 mg total) by mouth every 4 (four) hours as needed for headache or mild pain.    Marland Kitchen amlodipine-atorvastatin (CADUET) 5-80 MG tablet Take 1 tablet by mouth daily. 30 tablet 11  . aspirin 81 MG tablet Take 81  mg by mouth daily.    . clopidogrel (PLAVIX) 75 MG tablet Take 1 tablet (75 mg total) by mouth daily. 30 tablet 11  . escitalopram (LEXAPRO) 10 MG tablet Take 10 mg by mouth daily.    Marland Kitchen ezetimibe (ZETIA) 10 MG tablet Take 1 tablet (10 mg total) by mouth daily. 30 tablet 11  . isosorbide mononitrate (IMDUR) 30 MG 24 hr tablet Take 0.5 tablets (15 mg total) by mouth daily. 15 tablet 11  . levETIRAcetam (KEPPRA) 500 MG tablet Take 1 tablet (500 mg total) by mouth 2 (two) times daily. 60 tablet 5  . lisinopril (PRINIVIL,ZESTRIL) 10 MG tablet Take 1 tablet (10 mg total) by mouth daily. 30 tablet 11  . metoprolol succinate (TOPROL-XL) 25 MG 24 hr tablet Take 1 tablet (25 mg total) by mouth daily. 30 tablet 11  . nitroGLYCERIN (NITROSTAT) 0.4 MG SL tablet Place 1 tablet (0.4 mg total) under the tongue every 5 (five) minutes x 3 doses as needed for chest pain. 25 tablet 4  . topiramate (TOPAMAX) 25 MG tablet Take 3 tablets (75 mg total) by mouth 2 (two) times daily. 540 tablet 3   No current facility-administered medications for this visit.      Allergies:   Depakote [divalproex sodium] and Penicillins   Social History   Social History  . Marital status: Divorced    Spouse name: N/A  . Number of children: 2  . Years of education: 12   Occupational History  . retired    Social History Main Topics  . Smoking status: Current Every Day Smoker    Packs/day: 0.33    Years: 35.00    Types: Cigarettes  . Smokeless tobacco: Former Systems developer    Quit date: 11/20/2012  . Alcohol use Yes     Comment: 06/11/2016 "might have a few drinks/month; usually wine"  . Drug use: No  . Sexual activity: Not Currently   Other Topics Concern  . None   Social History Narrative   Patient drinks about 1 cup of coffee daily.   Patient is right handed.      Family History:  The patient's family history includes Cancer in her father; Diabetes in her brother, brother, and father; Heart attack in her sister; Heart  disease in her brother and sister; Stroke in her brother.   ROS:   Please see the history of present illness. No recent seizure. All other systems are reviewed and otherwise negative.    PHYSICAL EXAM:   VS:  BP 122/60 (BP Location: Left Arm, Patient Position: Sitting, Cuff Size: Normal)   Pulse 70   Ht 5' 1" (1.549 m)   Wt 144 lb 12.8 oz (65.7 kg)   SpO2 98%   BMI 27.36 kg/m   BMI: Body mass index is 27.36 kg/m. GEN: Well nourished, well developed AAF, in no acute distress  HEENT: normocephalic,  atraumatic Neck: no JVD, carotid bruits, or masses Cardiac: RRR; no murmurs, rubs, or gallops, no edema  Respiratory:  clear to auscultation bilaterally, normal work of breathing GI: soft, nontender, nondistended, + BS MS: no deformity or atrophy  Skin: warm and dry, no rash Neuro:  Alert and Oriented x 3, Strength and sensation are intact, follows commands Psych: euthymic mood, full affect  Wt Readings from Last 3 Encounters:  08/27/16 144 lb 12.8 oz (65.7 kg)  08/09/16 142 lb (64.4 kg)  06/11/16 136 lb (61.7 kg)      Studies/Labs Reviewed:   EKG: EKG was not ordered today.  Recent Labs: 06/10/2016: ALT 17 06/11/2016: BUN 13; Creatinine, Ser 0.98; Hemoglobin 12.1; Platelets 206; Potassium 4.1; Sodium 140   Lipid Panel    Component Value Date/Time   CHOL 216 (H) 06/11/2016 0521   TRIG 86 06/11/2016 0521   HDL 48 06/11/2016 0521   CHOLHDL 4.5 06/11/2016 0521   VLDL 17 06/11/2016 0521   LDLCALC 151 (H) 06/11/2016 0521    Additional studies/ records that were reviewed today include: Summarized above.     ASSESSMENT & PLAN:   1. Chest pain - resolved. Not clear if this was anxiety, musculoskeletal, or microvascular in nature. Continue present regimen including low dose Imdur.  2. CAD s/p CABG - patent grafts by cath 05/2016. Encouraged gradual introduction back into regular physical activity - this will be beneficial long term. 3. Essential HTN - controlled.   4. Pulm HTN - resolved by 2014 echo. 5. Dyslipidemia - we clarified the atorvastatin confusion as above. Fortunately she has been taking Caduet the last few months without any adverse side effects. She uses an outside lab due to her insurance and has a lab slip to repeat labs in February. I asked her to inform us of any recurrent myalgias.  Disposition: F/u with Dr. Radford Pax in 6 months.   Medication Adjustments/Labs and Tests Ordered: Current medicines are reviewed at length with the patient today.  Concerns regarding medicines are outlined above. Medication changes, Labs and Tests ordered today are summarized above and listed in the Patient Instructions accessible in Encounters.   Raechel Ache PA-C  08/27/2016 9:33 AM    [address], [place], [place]  [address] Phone: [phone]; Fax: [phone]

## 2016-08-27 ENCOUNTER — Ambulatory Visit (INDEPENDENT_AMBULATORY_CARE_PROVIDER_SITE_OTHER): Payer: Medicare HMO | Admitting: Physician Assistant

## 2016-08-27 ENCOUNTER — Encounter: Payer: Self-pay | Admitting: Physician Assistant

## 2016-08-27 VITALS — BP 122/60 | HR 70 | Ht 61.0 in | Wt 144.8 lb

## 2016-08-27 DIAGNOSIS — R0789 Other chest pain: Secondary | ICD-10-CM

## 2016-08-27 DIAGNOSIS — I10 Essential (primary) hypertension: Secondary | ICD-10-CM | POA: Diagnosis not present

## 2016-08-27 DIAGNOSIS — E785 Hyperlipidemia, unspecified: Secondary | ICD-10-CM

## 2016-08-27 DIAGNOSIS — I272 Pulmonary hypertension, unspecified: Secondary | ICD-10-CM

## 2016-08-27 DIAGNOSIS — I2510 Atherosclerotic heart disease of native coronary artery without angina pectoris: Secondary | ICD-10-CM | POA: Diagnosis not present

## 2016-08-27 NOTE — Patient Instructions (Addendum)
Medication Instructions:  Your physician recommends that you continue on your current medications as directed. Please refer to the Current Medication list given to you today.   Labwork: None -please fax a copy of the lab results you have done in February to Dr Radford Pax [phone]  Testing/Procedures: None   Follow-Up: Your physician wants you to follow-up in: 6 months with Dr Radford Pax. (July 2018) You will receive a reminder letter in the mail two months in advance. If you don't receive a letter, please call our office to schedule the follow-up appointment.        If you need a refill on your cardiac medications before your next appointment, please call your pharmacy.

## 2016-09-07 DIAGNOSIS — I10 Essential (primary) hypertension: Secondary | ICD-10-CM | POA: Diagnosis not present

## 2016-09-07 DIAGNOSIS — E7800 Pure hypercholesterolemia, unspecified: Secondary | ICD-10-CM | POA: Diagnosis not present

## 2016-09-07 DIAGNOSIS — R69 Illness, unspecified: Secondary | ICD-10-CM | POA: Diagnosis not present

## 2016-09-07 DIAGNOSIS — F320 Major depressive disorder, single episode, mild: Secondary | ICD-10-CM | POA: Diagnosis not present

## 2016-10-15 ENCOUNTER — Telehealth: Payer: Self-pay | Admitting: Cardiology

## 2016-10-15 DIAGNOSIS — Z008 Encounter for other general examination: Secondary | ICD-10-CM | POA: Diagnosis not present

## 2016-10-15 NOTE — Telephone Encounter (Signed)
New message    Pt is calling to ask about which medication she can take before going to have labs drawn today. She states when she was here in December she thinks they told her not to take something before having the blood drawn.

## 2016-10-15 NOTE — Telephone Encounter (Signed)
Patient is having fasting labs drawn at another office today. Informed her she may take her medications as instructed. She was grateful for call.

## 2016-10-18 ENCOUNTER — Encounter: Payer: Self-pay | Admitting: Adult Health

## 2016-10-18 ENCOUNTER — Ambulatory Visit (INDEPENDENT_AMBULATORY_CARE_PROVIDER_SITE_OTHER): Payer: Medicare HMO | Admitting: Adult Health

## 2016-10-18 VITALS — BP 107/69 | HR 93 | Ht 61.0 in | Wt 143.2 lb

## 2016-10-18 DIAGNOSIS — R569 Unspecified convulsions: Secondary | ICD-10-CM | POA: Diagnosis not present

## 2016-10-18 DIAGNOSIS — G43009 Migraine without aura, not intractable, without status migrainosus: Secondary | ICD-10-CM

## 2016-10-18 MED ORDER — LEVETIRACETAM 500 MG PO TABS: 500.0000 mg | ORAL_TABLET | Freq: Two times a day (BID) | ORAL | 3 refills | Status: DC

## 2016-10-18 MED ORDER — TOPIRAMATE 25 MG PO TABS: 75.0000 mg | ORAL_TABLET | Freq: Two times a day (BID) | ORAL | 3 refills | Status: DC

## 2016-10-18 NOTE — Progress Notes (Signed)
I have read the note, and I agree with the clinical assessment and plan.  Samantha Huffman   

## 2016-10-18 NOTE — Progress Notes (Signed)
PATIENT: Samantha Huffman DOB: 1955-10-02  REASON FOR VISIT: follow up- seizures, migraine headaches HISTORY FROM: patient  HISTORY OF PRESENT ILLNESS: Samantha Huffman is a 61 year old female with a history of seizures and migraine headaches. She returns today for follow-up. She reports that Topamax continues to work well for her. She denies any headaches. She continues on Keppra for seizures. She denies any seizure events. She operates a Teacher, music without difficulty. She is able to complete all ADLs independently. She is participating in exercise classes. Denies any changes with her gait or balance. She states that in October she was hospitalized in [place] due to heart related issues. During that hospitalization she was given Lexapro to help her rest.. She is asking that we refill this. She returns today for an evaluation.  HISTORY 04/15/16: Samantha Huffman is a 61 year old female with a history of seizures and possible migraine headaches. She returns today for follow-up. At the last visit we increased Topamax to 75 mg twice a day. She reports that this has been "wonderful." She states that she has not had a headache since she increased this medicine. She continues on Keppra. Denies any seizure events. She operates a Teacher, music without difficulty. She is able to complete all ADLs independently. She states that she continues to have trouble comprehending things. For that reason she does not read a lot and she has her daughter help her with her finances so she does not make any big mistakes. She states that this has been going on since her headaches initially began. She does not feel that Topamax has made this worse. She returns today for an evaluation.  HISTORY 4/25/17Ms. Huffman is a 61 year old female with a history of seizures and possible migraine headaches. She returns today for follow-up. At the last visit her Topamax was increased to 50 mg twice a day. She reports that she is tolerating this  medication well. She states that she's not had any additional seizure events. She describes her seizures as not being able to speak, changes in her vision and occasionally a tremor in the extremities. She denies any loss of consciousness. Denies any loss of bowels or bladder or biting her tongue. She reports that she has approximately 4 headaches a month. She states her headaches are not always severe. Her headaches normally occur in the right or left frontal region. She reports photophobia but denies phonophobia. Denies nausea and vomiting. She states that typically when she gets a headache she has to lay down. She has noticed recently she's been stumbling but denies any falls. Denies any weakness in the lower extremities. The patient reports that at times she does feel slightly depressed. But denies any thoughts of harming herself or others. She returns today for an evaluation.  HISTORY 10/15/15: Samantha Huffman is a 61 year old right-handed black female with a history of episodes of what have been thought to be seizures. Prior workups have shown left brain sharp wave activity. The patient however has episodes that clinically sound more like migraine headache. The patient has had a recurring event this morning, she comes into the office today. The patient woke up this morning with a headache across the front of the head. The patient felt weak all over, she had some blurring of vision. She did have some difficulty with speech. She denied any focal numbness or weakness. She denies any balance issues or difficulty with swallowing. The headache has persisted throughout the day, but it has improved some. The patient has felt some  nausea, with no vomiting. The patient does have some phonophobia, no photophobia. She is on Topamax, but she is still on 25 mg twice daily, she never went to the 50 mg twice daily dose. She has been tapered off of the Depakote. She remains on Keppra 500 mg twice daily   REVIEW OF SYSTEMS: Out  of a complete 14 system review of symptoms, the patient complains only of the following symptoms, and all other reviewed systems are negative.  See history of present illness  ALLERGIES: Allergies  Allergen Reactions  . Depakote [Divalproex Sodium] Other (See Comments)    Hyperammonemia  . Penicillins     Other reaction(s): GI intolerance Has patient had a PCN reaction causing immediate rash, facial/tongue/throat swelling, SOB or lightheadedness with hypotension: YES Has patient had a PCN reaction causing severe rash involving mucus membranes or skin necrosis: NO Has patient had a PCN reaction that required hospitalizationNO Has patient had a PCN reaction occurring within the last 10 years: NO If all of the above answers are "NO", then may proceed with Cephalosporin use.    HOME MEDICATIONS: Outpatient Medications Prior to Visit  Medication Sig Dispense Refill  . acetaminophen (TYLENOL) 325 MG tablet Take 2 tablets (650 mg total) by mouth every 4 (four) hours as needed for headache or mild pain.    Marland Kitchen amlodipine-atorvastatin (CADUET) 5-80 MG tablet Take 1 tablet by mouth daily. 30 tablet 11  . aspirin 81 MG tablet Take 81 mg by mouth daily.    . clopidogrel (PLAVIX) 75 MG tablet Take 1 tablet (75 mg total) by mouth daily. 30 tablet 11  . escitalopram (LEXAPRO) 10 MG tablet Take 10 mg by mouth daily.    Marland Kitchen ezetimibe (ZETIA) 10 MG tablet Take 1 tablet (10 mg total) by mouth daily. 30 tablet 11  . isosorbide mononitrate (IMDUR) 30 MG 24 hr tablet Take 0.5 tablets (15 mg total) by mouth daily. 15 tablet 11  . levETIRAcetam (KEPPRA) 500 MG tablet Take 1 tablet (500 mg total) by mouth 2 (two) times daily. 60 tablet 5  . lisinopril (PRINIVIL,ZESTRIL) 10 MG tablet Take 1 tablet (10 mg total) by mouth daily. 30 tablet 11  . metoprolol succinate (TOPROL-XL) 25 MG 24 hr tablet Take 1 tablet (25 mg total) by mouth daily. 30 tablet 11  . nitroGLYCERIN (NITROSTAT) 0.4 MG SL tablet Place 1 tablet (0.4  mg total) under the tongue every 5 (five) minutes x 3 doses as needed for chest pain. 25 tablet 4  . topiramate (TOPAMAX) 25 MG tablet Take 3 tablets (75 mg total) by mouth 2 (two) times daily. 540 tablet 3   No facility-administered medications prior to visit.     PAST MEDICAL HISTORY: Past Medical History:  Diagnosis Date  . Carotid artery stenosis    a. Last carotid 07/2015 - 1-39% stenosis (repeat due 07/2017).  . Coronary artery disease    a. s/p CABG 10/2012.  Marland Kitchen Dyslipidemia   . Generalized convulsive epilepsy without mention of intractable epilepsy   . Hypertension   . Memory deficit   . Migraine    "controlled on daily RX " (06/11/2016)  . Nephrolithiasis 07/30/2015   S/p lithotripsy x 3 and right and left ureter stents  . OSA (obstructive sleep apnea)    hx; was on CPAP; haven't needed it since OHS" (06/11/2016)  . PONV (postoperative nausea and vomiting)   . Pulmonary HTN 07/30/2015   Mild with PASP 35-60mmHg by echo 0000000 with diastolic dysfunction  .  Seizures ([place])    "controlled w/daily RX; started in 2012; dr thinks they might be from the migraines; last sz was early part of 2016" (06/11/2016)  . Type II diabetes mellitus ([place])    diet controlled (06/11/2016)  . Vasovagal syncope 07/30/2015   2011    PAST SURGICAL HISTORY: Past Surgical History:  Procedure Laterality Date  . CARDIAC CATHETERIZATION  2014; 06/11/2016  . CARDIAC CATHETERIZATION N/A 06/11/2016   Procedure: Left Heart Cath and Cors/Grafts Angiography;  Surgeon: Sherren Mocha, MD;  Location: [facility];  Service: Cardiovascular;  Laterality: N/A;  . CESAREAN SECTION    . CORONARY ANGIOPLASTY    . CORONARY ARTERY BYPASS GRAFT  March, 2014   LIMA to LAD, left radial to LCx  . LITHOTRIPSY  X 3  . TOTAL ABDOMINAL HYSTERECTOMY    . TUBAL LIGATION    . URETERAL STENT PLACEMENT      FAMILY HISTORY: Family History  Problem Relation Age of Onset  . Cancer Father   . Diabetes Father   .  Diabetes Brother   . Stroke Brother   . Diabetes Brother   . Heart disease Brother     CAD with CABG  . Heart disease Sister     CAD with MI  . Heart attack Sister     SOCIAL HISTORY: Social History   Social History  . Marital status: Divorced    Spouse name: N/A  . Number of children: 2  . Years of education: 12   Occupational History  . retired    Social History Main Topics  . Smoking status: Current Every Day Smoker    Packs/day: 0.33    Years: 35.00    Types: Cigarettes  . Smokeless tobacco: Former Systems developer    Quit date: 11/20/2012  . Alcohol use Yes     Comment: 06/11/2016 "might have a few drinks/month; usually wine"  . Drug use: No  . Sexual activity: Not Currently   Other Topics Concern  . Not on file   Social History Narrative   Patient drinks about 1 cup of coffee daily.   Patient is right handed.       PHYSICAL EXAM  Vitals:   10/18/16 0814  BP: 107/69  Pulse: 93  Weight: 143 lb 3.2 oz (65 kg)  Height: 5\' 1"  (1.549 m)   Body mass index is 27.06 kg/m.  Generalized: Well developed, in no acute distress   Neurological examination  Mentation: Alert oriented to time, place, history taking. Follows all commands speech and language fluent Cranial nerve II-XII: Pupils were equal round reactive to light. Extraocular movements were full, visual field were full on confrontational test. Facial sensation and strength were normal. Uvula tongue midline. Head turning and shoulder shrug  were normal and symmetric. Motor: The motor testing reveals 5 over 5 strength of all 4 extremities. Good symmetric motor tone is noted throughout.  Sensory: Sensory testing is intact to soft touch on all 4 extremities. No evidence of extinction is noted.  Coordination: Cerebellar testing reveals good finger-nose-finger and heel-to-shin bilaterally.  Gait and station: Gait is normal. Tandem gait is normal. Romberg is negative. No drift is seen.  Reflexes: Deep tendon reflexes are  symmetric and normal bilaterally.   DIAGNOSTIC DATA (LABS, IMAGING, TESTING) - I reviewed patient records, labs, notes, testing and imaging myself where available.  Lab Results  Component Value Date   WBC 6.4 06/11/2016   HGB 12.1 06/11/2016   HCT 37.2 06/11/2016  MCV 92.3 06/11/2016   PLT 206 06/11/2016      Component Value Date/Time   NA 140 06/11/2016 0521   NA 138 09/15/2015 1026   K 4.1 06/11/2016 0521   CL 112 (H) 06/11/2016 0521   CO2 21 (L) 06/11/2016 0521   GLUCOSE 102 (H) 06/11/2016 0521   BUN 13 06/11/2016 0521   BUN 10 09/15/2015 1026   CREATININE 0.98 06/11/2016 0521   CALCIUM 9.3 06/11/2016 0521   PROT 6.8 06/10/2016 1848   PROT 7.4 09/15/2015 1026   ALBUMIN 3.9 06/10/2016 1848   ALBUMIN 4.4 09/15/2015 1026   AST 21 06/10/2016 1848   ALT 17 06/10/2016 1848   ALKPHOS 124 06/10/2016 1848   BILITOT 0.4 06/10/2016 1848   BILITOT 0.3 09/15/2015 1026   GFRNONAA >60 06/11/2016 0521   GFRAA >60 06/11/2016 0521   Lab Results  Component Value Date   CHOL 216 (H) 06/11/2016   HDL 48 06/11/2016   LDLCALC 151 (H) 06/11/2016   TRIG 86 06/11/2016   CHOLHDL 4.5 06/11/2016   Lab Results  Component Value Date   HGBA1C 6.2 (H) 06/10/2016        ASSESSMENT AND PLAN 61 y.o. year old female  has a past medical history of Carotid artery stenosis; Coronary artery disease; Dyslipidemia; Generalized convulsive epilepsy without mention of intractable epilepsy; Hypertension; Memory deficit; Migraine; Nephrolithiasis (07/30/2015); OSA (obstructive sleep apnea); PONV (postoperative nausea and vomiting); Pulmonary HTN (07/30/2015); Seizures ([place]); Type II diabetes mellitus ([place]); and Vasovagal syncope (07/30/2015). here with:  1. Seizures 2. Migraine headaches  Overall the patient is doing well. She will continue on Topamax for headaches and Keppra for seizures. I advised that her primary care should refill Lexapro. We are currently not treating anxiety or depression. Patient  verbalized understanding. She will follow-up in 67months or sooner if needed.     Ward Givens, MSN, NP-C 10/18/2016, 8:14 AM [facility] [address], [place],  [address] [phone]

## 2016-10-18 NOTE — Patient Instructions (Signed)
Continue topamax Continue Keppra If your symptoms worsen or you develop new symptoms please let us know.

## 2016-12-08 ENCOUNTER — Other Ambulatory Visit: Payer: Self-pay | Admitting: Family Medicine

## 2016-12-08 ENCOUNTER — Ambulatory Visit
Admission: RE | Admit: 2016-12-08 | Discharge: 2016-12-08 | Disposition: A | Discharge status: Home / Self Care | Payer: Medicare HMO | Source: Ambulatory Visit | Attending: Family Medicine | Admitting: Family Medicine

## 2016-12-08 DIAGNOSIS — R7309 Other abnormal glucose: Secondary | ICD-10-CM | POA: Diagnosis not present

## 2016-12-08 DIAGNOSIS — I2510 Atherosclerotic heart disease of native coronary artery without angina pectoris: Secondary | ICD-10-CM | POA: Diagnosis not present

## 2016-12-08 DIAGNOSIS — M79602 Pain in left arm: Secondary | ICD-10-CM

## 2016-12-08 DIAGNOSIS — Z0000 Encounter for general adult medical examination without abnormal findings: Secondary | ICD-10-CM | POA: Diagnosis not present

## 2016-12-08 DIAGNOSIS — E7800 Pure hypercholesterolemia, unspecified: Secondary | ICD-10-CM | POA: Diagnosis not present

## 2016-12-08 DIAGNOSIS — R69 Illness, unspecified: Secondary | ICD-10-CM | POA: Diagnosis not present

## 2016-12-08 DIAGNOSIS — I10 Essential (primary) hypertension: Secondary | ICD-10-CM | POA: Diagnosis not present

## 2016-12-08 DIAGNOSIS — M79622 Pain in left upper arm: Secondary | ICD-10-CM | POA: Diagnosis not present

## 2016-12-09 DIAGNOSIS — R7309 Other abnormal glucose: Secondary | ICD-10-CM | POA: Diagnosis not present

## 2016-12-09 DIAGNOSIS — I10 Essential (primary) hypertension: Secondary | ICD-10-CM | POA: Diagnosis not present

## 2017-01-24 ENCOUNTER — Telehealth: Payer: Self-pay | Admitting: Cardiology

## 2017-01-24 NOTE — Telephone Encounter (Signed)
New Message     Pt c/o swelling: STAT is pt has developed SOB within 24 hours  1. How long have you been experiencing swelling? Since Thursday last week   2. Where is the swelling located?  Leg and ankle   3.  Are you currently taking a "fluid pill"? no  4.  Are you currently SOB?  Yes and very lathargic  5.  Have you traveled recently no

## 2017-01-24 NOTE — Telephone Encounter (Signed)
Called patient back about her BP. Patient stated her BP 135/80 and HR 79. Informed patient to take her morning medications and hold her amlodipine medication until her office visit tomorrow. Will see if that will help the swelling in her BLE. Encouraged patient to elevated her legs when sitting and to watch her salt intake.  Will forward to Dr. Radford Pax for further advisement.

## 2017-01-24 NOTE — Progress Notes (Signed)
Cardiology Office Note    Date:  01/25/2017   ID:  Samantha Huffman, DOB 10/20/1955, MRN [id number]  PCP:  Alroy Dust, Carlean Jews.Marlou Sa, MD  Cardiologist: Dr. Radford Pax  Chief Complaint  Patient presents with  . Leg Swelling    History of Present Illness:  Samantha Huffman is a 61 y.o. female a history of ASCAD s/p CABG 10/2012, dyslipidemia, HTN, DM and mild carotid artery disease. History of chest pain 05/2016 nuclear stress test with ischemia but cath showed patent free radial grafts to OM and LIMA to LAD and 30% RCA. Continues to have chest pain with stress.? Microvascular angina and Imdur added by Dr. Radford Pax 07/2016.   Labs reviewed from 11/2016 and lipids and renal function are stable.  Patient comes in today complaining of bilateral ankle and feet swelling since last Thursday. She can't recall getting any extra salt in her diet. She called in to the office and was told to stop her amlodipine. She woke up this morning and they edema was gone. She hasn't been checking her blood pressure lately so doesn't know if it has been elevated. She did check it yesterday and was stable. She had some dyspnea on exertion associated with it. No chest pain, palpitations, dizziness or presyncope   Past Medical History:  Diagnosis Date  . Carotid artery stenosis    a. Last carotid 07/2015 - 1-39% stenosis (repeat due 07/2017).  . Coronary artery disease    a. s/p CABG 10/2012.  Marland Kitchen Dyslipidemia   . Generalized convulsive epilepsy without mention of intractable epilepsy   . Hypertension   . Memory deficit   . Migraine    "controlled on daily RX " (06/11/2016)  . Nephrolithiasis 07/30/2015   S/p lithotripsy x 3 and right and left ureter stents  . OSA (obstructive sleep apnea)    hx; was on CPAP; haven't needed it since OHS" (06/11/2016)  . PONV (postoperative nausea and vomiting)   . Pulmonary HTN 07/30/2015   Mild with PASP 35-23mmHg by echo 1308 with diastolic dysfunction  . Seizures ([place])    "controlled w/daily  RX; started in 2012; dr thinks they might be from the migraines; last sz was early part of 2016" (06/11/2016)  . Type II diabetes mellitus ([place])    diet controlled (06/11/2016)  . Vasovagal syncope 07/30/2015   2011    Past Surgical History:  Procedure Laterality Date  . CARDIAC CATHETERIZATION  2014; 06/11/2016  . CARDIAC CATHETERIZATION N/A 06/11/2016   Procedure: Left Heart Cath and Cors/Grafts Angiography;  Surgeon: Sherren Mocha, MD;  Location: [facility];  Service: Cardiovascular;  Laterality: N/A;  . CESAREAN SECTION    . CORONARY ANGIOPLASTY    . CORONARY ARTERY BYPASS GRAFT  March, 2014   LIMA to LAD, left radial to LCx  . LITHOTRIPSY  X 3  . TOTAL ABDOMINAL HYSTERECTOMY    . TUBAL LIGATION    . URETERAL STENT PLACEMENT      Current Medications: Outpatient Medications Prior to Visit  Medication Sig Dispense Refill  . acetaminophen (TYLENOL) 325 MG tablet Take 2 tablets (650 mg total) by mouth every 4 (four) hours as needed for headache or mild pain.    Marland Kitchen amlodipine-atorvastatin (CADUET) 5-80 MG tablet Take 1 tablet by mouth daily. 30 tablet 11  . aspirin 81 MG tablet Take 81 mg by mouth daily.    . clopidogrel (PLAVIX) 75 MG tablet Take 1 tablet (75 mg total) by mouth daily. 30 tablet 11  . escitalopram (LEXAPRO)  10 MG tablet Take 10 mg by mouth daily.    Marland Kitchen ezetimibe (ZETIA) 10 MG tablet Take 1 tablet (10 mg total) by mouth daily. 30 tablet 11  . isosorbide mononitrate (IMDUR) 30 MG 24 hr tablet Take 0.5 tablets (15 mg total) by mouth daily. 15 tablet 11  . levETIRAcetam (KEPPRA) 500 MG tablet Take 1 tablet (500 mg total) by mouth 2 (two) times daily. 180 tablet 3  . metoprolol succinate (TOPROL-XL) 25 MG 24 hr tablet Take 1 tablet (25 mg total) by mouth daily. 30 tablet 11  . nitroGLYCERIN (NITROSTAT) 0.4 MG SL tablet Place 1 tablet (0.4 mg total) under the tongue every 5 (five) minutes x 3 doses as needed for chest pain. 25 tablet 4  . topiramate (TOPAMAX) 25 MG  tablet Take 3 tablets (75 mg total) by mouth 2 (two) times daily. 540 tablet 3  . lisinopril (PRINIVIL,ZESTRIL) 10 MG tablet Take 1 tablet (10 mg total) by mouth daily. 30 tablet 11   No facility-administered medications prior to visit.      Allergies:   Depakote [divalproex sodium] and Penicillins   Social History   Social History  . Marital status: Divorced    Spouse name: N/A  . Number of children: 2  . Years of education: 12   Occupational History  . retired    Social History Main Topics  . Smoking status: Former Smoker    Packs/day: 0.33    Years: 35.00    Types: Cigarettes    Quit date: 06/11/2016  . Smokeless tobacco: Former Systems developer    Quit date: 11/20/2012  . Alcohol use Yes     Comment: 06/11/2016 "might have a few drinks/month; usually wine"  . Drug use: No  . Sexual activity: Not Currently   Other Topics Concern  . Not on file   Social History Narrative   Patient drinks about 1 cup of coffee daily.   Patient is right handed.      Family History:  The patient's  family history includes Cancer in her father; Diabetes in her brother, brother, and father; Heart attack in her sister; Heart disease in her brother and sister; Stroke in her brother.   ROS:   Please see the history of present illness.    Review of Systems  Constitution: Positive for malaise/fatigue.  HENT: Negative.   Eyes: Negative.   Cardiovascular: Positive for dyspnea on exertion and leg swelling.  Respiratory: Negative.   Hematologic/Lymphatic: Negative.   Musculoskeletal: Negative.  Negative for joint pain.  Gastrointestinal: Negative.   Genitourinary: Negative.   Neurological: Negative.    All other systems reviewed and are negative.   PHYSICAL EXAM:   VS:  BP 140/70 (BP Location: Left Arm, Patient Position: Sitting, Cuff Size: Normal)   Pulse 73   Ht 5\' 1"  (1.549 m)   Wt 140 lb 12.8 oz (63.9 kg)   SpO2 97% Comment: at rest  BMI 26.60 kg/m   Physical Exam  GEN: Well nourished,  well developed, in no acute distress  Neck: no JVD, carotid bruits, or masses Cardiac:RRR; no murmurs, rubs, or gallops  Respiratory:  clear to auscultation bilaterally, normal work of breathing GI: soft, nontender, nondistended, + BS Ext: without cyanosis, clubbing, or edema, Good distal pulses bilaterally Neuro:  Alert and Oriented x 3 Psych: euthymic mood, full affect  Wt Readings from Last 3 Encounters:  01/25/17 140 lb 12.8 oz (63.9 kg)  10/18/16 143 lb 3.2 oz (65 kg)  08/27/16 144 lb  12.8 oz (65.7 kg)      Studies/Labs Reviewed:   EKG:  EKG is not ordered today.    Recent Labs: 06/10/2016: ALT 17 06/11/2016: BUN 13; Creatinine, Ser 0.98; Hemoglobin 12.1; Platelets 206; Potassium 4.1; Sodium 140   Lipid Panel    Component Value Date/Time   CHOL 216 (H) 06/11/2016 0521   TRIG 86 06/11/2016 0521   HDL 48 06/11/2016 0521   CHOLHDL 4.5 06/11/2016 0521   VLDL 17 06/11/2016 0521   LDLCALC 151 (H) 06/11/2016 0521    Additional studies/ records that were reviewed today include:  Cath 05/2016  Conclusion  1. Moderate left main stenosis, mild LCx and RCA stenoses, and total occlusion of the mid-LAD 2. S/P CABG with continued patency of the LIMA-LAD and free radial to OM grafts 3. Normal LV function   Suspect non-cardiac chest pain. Continue medical therapy for CAD. Should be ok for discharge home today after her post-procedure care   Carotid dopplers Notes Recorded by Sueanne Margarita, MD on 08/06/2015 at 11:12 AM 1-39% bilateral carotid artery stenosis - repeat study in 2 years     ASSESSMENT:    1. Localized edema   2. Essential hypertension   3. Atherosclerosis of native coronary artery of native heart without angina pectoris   4. Dyslipidemia      PLAN:  In order of problems listed above:  Bilateral lower extremity edema has resolved since she stopped her amlodipine but she has been on this drug for many years without any problems. I wonder if she got extra  sodium in her diet or her blood pressures been elevated. We'll check a 2-D echo to rule out diastolic dysfunction. She has not had one in the past. Increase lisinopril to 20 mg once daily for better blood pressure control. We'll leave her off amlodipine at this time. Follow-up with Dr. Radford Pax in 2-3 months. She should check her blood pressures at home.  Essential hypertension blood pressure is up a little today. Increase lisinopril to 20 mg once daily. 2 g sodium diet. Check blood pressures more frequently at home and call us with results. Follow-up with Dr. Radford Pax.  CAD status post prior CABG without angina  Dyslipidemia on Zetia. Lipid panel reviewed from April is excellent.    Medication Adjustments/Labs and Tests Ordered: Current medicines are reviewed at length with the patient today.  Concerns regarding medicines are outlined above.  Medication changes, Labs and Tests ordered today are listed in the Patient Instructions below. There are no Patient Instructions on file for this visit.   Sumner Boast, PA-C  01/25/2017 8:28 AM    [facility] [place], [place], [place]  [address] Phone: [phone]; Fax: [phone]

## 2017-01-24 NOTE — Telephone Encounter (Signed)
Agree with plan 

## 2017-01-24 NOTE — Telephone Encounter (Signed)
Patient complaining of SOB with activity, BLE edema, and feeling tired. Patient denies any chest pain, and does not sound SOB on the phone. Patient stated her symptoms started on Thursday. Had patient take pulse over the phone, and heart rate is at 60. Patient is not sure of what her BP is, and will go [facility] by her house. Encouraged patient to hold her BP meds until we have a BP reading. Patient BP could be low and causing her to feel tired. Will call patient back to get BP reading. Encouraged patient to get someone to take her to ED if her symptoms get worse.

## 2017-01-25 ENCOUNTER — Ambulatory Visit (INDEPENDENT_AMBULATORY_CARE_PROVIDER_SITE_OTHER): Payer: Medicare HMO | Admitting: Physician Assistant

## 2017-01-25 ENCOUNTER — Encounter (INDEPENDENT_AMBULATORY_CARE_PROVIDER_SITE_OTHER): Payer: Self-pay

## 2017-01-25 ENCOUNTER — Encounter: Payer: Self-pay | Admitting: Physician Assistant

## 2017-01-25 VITALS — BP 140/70 | HR 73 | Ht 61.0 in | Wt 140.8 lb

## 2017-01-25 DIAGNOSIS — I2510 Atherosclerotic heart disease of native coronary artery without angina pectoris: Secondary | ICD-10-CM

## 2017-01-25 DIAGNOSIS — E785 Hyperlipidemia, unspecified: Secondary | ICD-10-CM

## 2017-01-25 DIAGNOSIS — R609 Edema, unspecified: Secondary | ICD-10-CM | POA: Insufficient documentation

## 2017-01-25 DIAGNOSIS — R600 Localized edema: Secondary | ICD-10-CM

## 2017-01-25 DIAGNOSIS — I10 Essential (primary) hypertension: Secondary | ICD-10-CM | POA: Diagnosis not present

## 2017-01-25 MED ORDER — LISINOPRIL 20 MG PO TABS: 20.0000 mg | ORAL_TABLET | Freq: Every day | ORAL | 9 refills | Status: DC

## 2017-01-25 NOTE — Patient Instructions (Addendum)
Medication Instructions:  Your physician has recommended you make the following change in your medication:  1. Increase lisinopril (20 mg ) daily, sent in today to patient's requested pharmacy.   Labwork: Patient was given a written script for BMET today.   Testing/Procedures: Your physician has requested that you have an echocardiogram. Echocardiography is a painless test that uses sound waves to create images of your heart. It provides your doctor with information about the size and shape of your heart and how well your heart's chambers and valves are working. This procedure takes approximately one hour. There are no restrictions for this procedure.    Follow-Up: Your physician recommends that you keep your scheduled  follow-up appointment with Dr. Radford Pax.    Any Other Special Instructions Will Be Listed Below (If Applicable).  Low-Sodium Eating Plan Sodium, which is an element that makes up salt, helps you maintain a healthy balance of fluids in your body. Too much sodium can increase your blood pressure and cause fluid and waste to be held in your body. Your health care provider or dietitian may recommend following this plan if you have high blood pressure (hypertension), kidney disease, liver disease, or heart failure. Eating less sodium can help lower your blood pressure, reduce swelling, and protect your heart, liver, and kidneys. What are tips for following this plan? General guidelines  Most people on this plan should limit their sodium intake to 1,500-2,000 mg (milligrams) of sodium each day. Reading food labels  The Nutrition Facts label lists the amount of sodium in one serving of the food. If you eat more than one serving, you must multiply the listed amount of sodium by the number of servings.  Choose foods with less than 140 mg of sodium per serving.  Avoid foods with 300 mg of sodium or more per serving. Shopping  Look for lower-sodium products, often labeled as  "low-sodium" or "no salt added."  Always check the sodium content even if foods are labeled as "unsalted" or "no salt added".  Buy fresh foods. ? Avoid canned foods and premade or frozen meals. ? Avoid canned, cured, or processed meats  Buy breads that have less than 80 mg of sodium per slice. Cooking  Eat more home-cooked food and less restaurant, buffet, and fast food.  Avoid adding salt when cooking. Use salt-free seasonings or herbs instead of table salt or sea salt. Check with your health care provider or pharmacist before using salt substitutes.  Cook with plant-based oils, such as canola, sunflower, or olive oil. Meal planning  When eating at a restaurant, ask that your food be prepared with less salt or no salt, if possible.  Avoid foods that contain MSG (monosodium glutamate). MSG is sometimes added to [place] food, bouillon, and some canned foods. What foods are recommended? The items listed may not be a complete list. Talk with your dietitian about what dietary choices are best for you. Grains Low-sodium cereals, including oats, puffed wheat and rice, and shredded wheat. Low-sodium crackers. Unsalted rice. Unsalted pasta. Low-sodium bread. Whole-grain breads and whole-grain pasta. Vegetables Fresh or frozen vegetables. "No salt added" canned vegetables. "No salt added" tomato sauce and paste. Low-sodium or reduced-sodium tomato and vegetable juice. Fruits Fresh, frozen, or canned fruit. Fruit juice. Meats and other protein foods Fresh or frozen (no salt added) meat, poultry, seafood, and fish. Low-sodium canned tuna and salmon. Unsalted nuts. Dried peas, beans, and lentils without added salt. Unsalted canned beans. Eggs. Unsalted nut butters. Dairy Milk. Soy milk.  Cheese that is naturally low in sodium, such as ricotta cheese, fresh mozzarella, or Swiss cheese Low-sodium or reduced-sodium cheese. Cream cheese. Yogurt. Fats and oils Unsalted butter. Unsalted margarine with  no trans fat. Vegetable oils such as canola or olive oils. Seasonings and other foods Fresh and dried herbs and spices. Salt-free seasonings. Low-sodium mustard and ketchup. Sodium-free salad dressing. Sodium-free light mayonnaise. Fresh or refrigerated horseradish. Lemon juice. Vinegar. Homemade, reduced-sodium, or low-sodium soups. Unsalted popcorn and pretzels. Low-salt or salt-free chips. What foods are not recommended? The items listed may not be a complete list. Talk with your dietitian about what dietary choices are best for you. Grains Instant hot cereals. Bread stuffing, pancake, and biscuit mixes. Croutons. Seasoned rice or pasta mixes. Noodle soup cups. Boxed or frozen macaroni and cheese. Regular salted crackers. Self-rising flour. Vegetables Sauerkraut, pickled vegetables, and relishes. Olives. [place] fries. Onion rings. Regular canned vegetables (not low-sodium or reduced-sodium). Regular canned tomato sauce and paste (not low-sodium or reduced-sodium). Regular tomato and vegetable juice (not low-sodium or reduced-sodium). Frozen vegetables in sauces. Meats and other protein foods Meat or fish that is salted, canned, smoked, spiced, or pickled. Bacon, ham, sausage, hotdogs, corned beef, chipped beef, packaged lunch meats, salt pork, jerky, pickled herring, anchovies, regular canned tuna, sardines, salted nuts. Dairy Processed cheese and cheese spreads. Cheese curds. Blue cheese. Feta cheese. String cheese. Regular cottage cheese. Buttermilk. Canned milk. Fats and oils Salted butter. Regular margarine. Ghee. Bacon fat. Seasonings and other foods Onion salt, garlic salt, seasoned salt, table salt, and sea salt. Canned and packaged gravies. Worcestershire sauce. Tartar sauce. Barbecue sauce. Teriyaki sauce. Soy sauce, including reduced-sodium. Steak sauce. Fish sauce. Oyster sauce. Cocktail sauce. Horseradish that you find on the shelf. Regular ketchup and mustard. Meat flavorings and  tenderizers. Bouillon cubes. Hot sauce and Tabasco sauce. Premade or packaged marinades. Premade or packaged taco seasonings. Relishes. Regular salad dressings. Salsa. Potato and tortilla chips. Corn chips and puffs. Salted popcorn and pretzels. Canned or dried soups. Pizza. Frozen entrees and pot pies. Summary  Eating less sodium can help lower your blood pressure, reduce swelling, and protect your heart, liver, and kidneys.  Most people on this plan should limit their sodium intake to 1,500-2,000 mg (milligrams) of sodium each day.  Canned, boxed, and frozen foods are high in sodium. Restaurant foods, fast foods, and pizza are also very high in sodium. You also get sodium by adding salt to food.  Try to cook at home, eat more fresh fruits and vegetables, and eat less fast food, canned, processed, or prepared foods. This information is not intended to replace advice given to you by your health care provider. Make sure you discuss any questions you have with your health care provider. Document Released: 01/29/2002 Document Revised: 08/02/2016 Document Reviewed: 08/02/2016 Elsevier Interactive Patient Education  2017 [employer].     If you need a refill on your cardiac medications before your next appointment, please call your pharmacy.

## 2017-01-31 ENCOUNTER — Other Ambulatory Visit: Payer: Self-pay | Admitting: Cardiology

## 2017-02-07 ENCOUNTER — Other Ambulatory Visit: Payer: Medicare HMO

## 2017-02-07 ENCOUNTER — Ambulatory Visit (HOSPITAL_COMMUNITY): Payer: Medicare HMO | Attending: Cardiology

## 2017-02-07 ENCOUNTER — Other Ambulatory Visit: Payer: Self-pay

## 2017-02-07 ENCOUNTER — Telehealth: Payer: Self-pay | Admitting: Cardiology

## 2017-02-07 DIAGNOSIS — I10 Essential (primary) hypertension: Secondary | ICD-10-CM

## 2017-02-07 DIAGNOSIS — I340 Nonrheumatic mitral (valve) insufficiency: Secondary | ICD-10-CM | POA: Diagnosis not present

## 2017-02-07 DIAGNOSIS — R600 Localized edema: Secondary | ICD-10-CM

## 2017-02-07 MED ORDER — ATORVASTATIN CALCIUM 80 MG PO TABS: 80.0000 mg | ORAL_TABLET | Freq: Every day | ORAL | 3 refills | Status: DC

## 2017-02-07 MED ORDER — LISINOPRIL 20 MG PO TABS: 20.0000 mg | ORAL_TABLET | Freq: Every day | ORAL | 9 refills | Status: DC

## 2017-02-07 NOTE — Telephone Encounter (Signed)
Confirmed with patient she STOPPED Caduet 5-80. She understands she needs to START LIPITOR 80 mg daily. She was grateful for call and clarification.

## 2017-02-07 NOTE — Telephone Encounter (Signed)
New message   Pt c/o medication issue:  1. Name of Medication: Amlodipine   2. How are you currently taking this medication (dosage and times per day)? unknown  3. Are you having a reaction (difficulty breathing--STAT)? no 4. What is your medication issue? Pt said that she is unsure on how much she is suppose to be taking

## 2017-02-07 NOTE — Telephone Encounter (Signed)
**Note De-Identified  Obfuscation** The pt called the office stating that she has been at the pharmacy all day and we will not send in her Amlodipine RX so it can be refilled. I overheard her phone conversation with Mindy in the refill dept. Who was trying to help her as much as she could but the pt became upset when she was advised that a nurse would have to help her as Mindy only handles refills. Per Mindy the pt stated "if you hang up on me I will come up there".  Dr Landis Gandy nurse was on the phone with another pt at the time so I offered to talk to her and [place] agreed.  The pt was very upset and crying when I picked up the phone. I asked if I could help her and she stated "if you are not as stupid as the rest of the staff there is then yes you can help me and Im going to tell Dr Radford Pax the next time Im there what trouble you guys have caused me. I have been at this pharmacy for hours and none of you will help me".  I again stated that I want to help her and to please tell me what medication she needs sent in for a refill. She stated Amlodipine. I advised her that Amlodipine was stopped at her last OV with Ermalinda Barrios, PA-c on 01/25/17 and that her Lisinopril was increased to 20 mg daily.  Per Ermalinda Barrios OV note from 6/5: Increase lisinopril to 20 mg once daily for better blood pressure control. We'll leave her off amlodipine at this time.  She verbalized understanding then asked me to send in a refill for Lisinopril 20 mg cause she was tired of dealing with this. I sent the RX in and also [facility] and they advised me that the pt picked up her Lisinopril 20 mg on 6/11. The pharmacist states that the pt maybe mistaken because she is asking for Amlodipine but meaning Lisinopril. The pharmacist states that the pt is ok at this and that she verbalized understanding to all of her medications and on how to take them.  I will forward this message to Dr Radford Pax and her  nurse as the Pts Caduet is still in the pts med list and if the pt remains off this medication she will need a statin.

## 2017-02-07 NOTE — Telephone Encounter (Signed)
Please make sure that patient is now on atorvastatin 80mg  daily as her amlodipine was stopped and she had been on caduet

## 2017-02-07 NOTE — Telephone Encounter (Signed)
New message     *STAT* If patient is at the pharmacy, call can be transferred to refill team.   1. Which medications need to be refilled? (please list name of each medication and dose if known) amlodipine  2. Which pharmacy/location (including street and city if local pharmacy) is medication to [facility] sent to[facility] [address]   3. Do they need a 30 day or 90 day supply? [place]

## 2017-04-18 ENCOUNTER — Ambulatory Visit: Payer: Medicare HMO | Admitting: Neurology

## 2017-04-26 ENCOUNTER — Encounter: Payer: Self-pay | Admitting: Cardiology

## 2017-04-26 ENCOUNTER — Ambulatory Visit (INDEPENDENT_AMBULATORY_CARE_PROVIDER_SITE_OTHER): Payer: Medicare HMO | Admitting: Cardiology

## 2017-04-26 ENCOUNTER — Encounter (INDEPENDENT_AMBULATORY_CARE_PROVIDER_SITE_OTHER): Payer: Self-pay

## 2017-04-26 VITALS — BP 142/88 | HR 58 | Ht 61.0 in | Wt 147.4 lb

## 2017-04-26 DIAGNOSIS — I10 Essential (primary) hypertension: Secondary | ICD-10-CM | POA: Diagnosis not present

## 2017-04-26 DIAGNOSIS — I2510 Atherosclerotic heart disease of native coronary artery without angina pectoris: Secondary | ICD-10-CM | POA: Diagnosis not present

## 2017-04-26 DIAGNOSIS — I6523 Occlusion and stenosis of bilateral carotid arteries: Secondary | ICD-10-CM

## 2017-04-26 DIAGNOSIS — E785 Hyperlipidemia, unspecified: Secondary | ICD-10-CM | POA: Diagnosis not present

## 2017-04-26 DIAGNOSIS — G4733 Obstructive sleep apnea (adult) (pediatric): Secondary | ICD-10-CM

## 2017-04-26 DIAGNOSIS — R600 Localized edema: Secondary | ICD-10-CM | POA: Diagnosis not present

## 2017-04-26 MED ORDER — FUROSEMIDE 20 MG PO TABS: 20.0000 mg | ORAL_TABLET | Freq: Every day | ORAL | 3 refills | Status: DC | PRN

## 2017-04-26 NOTE — Patient Instructions (Signed)
Medication Instructions:  1) START LASIX (Furosemide) 20 mg daily AS NEEDED for swelling.  Labwork: TODAY: Cholesterol, Liver, kidney function  Testing/Procedures: Your physician has recommended that you have a sleep study. This test records several body functions during sleep, including: brain activity, eye movement, oxygen and carbon dioxide blood levels, heart rate and rhythm, breathing rate and rhythm, the flow of air through your mouth and nose, snoring, body muscle movements, and chest and belly movement.  Follow-Up: Your provider wants you to follow-up in: 6 months with Dr. Theodosia Blender assistant. You will receive a reminder letter in the mail two months in advance. If you don't receive a letter, please call our office to schedule the follow-up appointment.    Your provider wants you to follow-up in: 1 year with Dr. Radford Pax. You will receive a reminder letter in the mail two months in advance. If you don't receive a letter, please call our office to schedule the follow-up appointment.    Any Other Special Instructions Will Be Listed Below (If Applicable).     If you need a refill on your cardiac medications before your next appointment, please call your pharmacy.

## 2017-04-26 NOTE — Progress Notes (Signed)
Cardiology Office Note:    Date:  04/26/2017   ID:  Samantha Huffman, DOB 19-Jul-1956, MRN [id number]  PCP:  Alroy Dust, Carlean Jews.Marlou Sa, MD  Cardiologist:  Fransico Him, MD   Referring MD: Alroy Dust, Carlean Jews.Marlou Sa, MD   Chief Complaint  Patient presents with  . Coronary Artery Disease  . Hypertension  . Hyperlipidemia    History of Present Illness:    Samantha Huffman is a 61 y.o. 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 It was felt that she may have microvascular angina and Imdur was added.She was last seen by my PA with complaints of bilateral ankle edema which resolved after stopping amlodipine.  Her lisinopril was increased for better BP control and a 2D echo was cone showing normal LVF with mild MR and no DD.   She is here today for followup and his doing well.  She denies any SOB, DOE (except with lifting heavy things), PND, orthopnea, dizziness (except when getting up too fast), palpitations or syncope. She does get some chest discomfort when she gets very emotionally upset but no exertional CP.  She has been having LE edema for the past few days and thinks it is due to some food she brought back from a trip to [place]. She says that she has sleep apnea and used to use a CPAP machine but lost it when when she moved to [place].  She says that recently she has been told that she snores and feels tired during the day.  Past Medical History:  Diagnosis Date  . Carotid artery stenosis    a. Last carotid 07/2015 - 1-39% stenosis .  Marland Kitchen Coronary artery disease    a. s/p CABG 10/2012.  cath 05/2016 with moderate LM stenosis, mild LCx and RCA stenosis and occluded LAD with patent LIMA to LAD, free radial to OM.  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  Marland Kitchen Dyslipidemia   . Generalized convulsive epilepsy without  mention of intractable epilepsy   . Hypertension   . Memory deficit   . Migraine    "controlled on daily RX " (06/11/2016)  . Nephrolithiasis 07/30/2015   S/p lithotripsy x 3 and right and left ureter stents  . OSA (obstructive sleep apnea)    hx; was on CPAP; haven't needed it since OHS" (06/11/2016)  . PONV (postoperative nausea and vomiting)   . Seizures ([place])    "controlled w/daily RX; started in 2012; dr thinks they might be from the migraines; last sz was early part of 2016" (06/11/2016)  . Type II diabetes mellitus ([place])    diet controlled (06/11/2016)  . Vasovagal syncope 07/30/2015   2011    Past Surgical History:  Procedure Laterality Date  . CARDIAC CATHETERIZATION  2014; 06/11/2016  . CARDIAC CATHETERIZATION N/A 06/11/2016   Procedure: Left Heart Cath and Cors/Grafts Angiography;  Surgeon: Sherren Mocha, MD;  Location: [place] [facility];  Service: Cardiovascular;  Laterality: N/A;  . CESAREAN SECTION    . CORONARY ANGIOPLASTY    . CORONARY ARTERY BYPASS GRAFT  March, 2014   LIMA to LAD, left radial to LCx  . LITHOTRIPSY  X 3  . TOTAL ABDOMINAL HYSTERECTOMY    . TUBAL LIGATION    . URETERAL STENT PLACEMENT      Current  Medications: Current Meds  Medication Sig  . acetaminophen (TYLENOL) 325 MG tablet Take 2 tablets (650 mg total) by mouth every 4 (four) hours as needed for headache or mild pain.  Marland Kitchen ALPRAZolam (XANAX) 0.25 MG tablet TAKE 1 TABLET BY MOUTH 3 TIMES A DAY AS NEEDED FOR ANXIETY  . aspirin 81 MG tablet Take 81 mg by mouth daily.  Marland Kitchen atorvastatin (LIPITOR) 80 MG tablet Take 1 tablet (80 mg total) by mouth daily.  . clopidogrel (PLAVIX) 75 MG tablet TAKE ONE TABLET BY MOUTH DAILY  . escitalopram (LEXAPRO) 10 MG tablet Take 10 mg by mouth daily.  Marland Kitchen ezetimibe (ZETIA) 10 MG tablet Take 1 tablet (10 mg total) by mouth daily.  . isosorbide mononitrate (IMDUR) 30 MG 24 hr tablet Take 0.5 tablets (15 mg total) by mouth daily.  Marland Kitchen levETIRAcetam (KEPPRA) 500 MG  tablet Take 1 tablet (500 mg total) by mouth 2 (two) times daily.  Marland Kitchen lisinopril (PRINIVIL,ZESTRIL) 20 MG tablet Take 1 tablet (20 mg total) by mouth daily.  . metoprolol succinate (TOPROL-XL) 25 MG 24 hr tablet Take 1 tablet (25 mg total) by mouth daily.  . nitroGLYCERIN (NITROSTAT) 0.4 MG SL tablet Place 1 tablet (0.4 mg total) under the tongue every 5 (five) minutes x 3 doses as needed for chest pain.  Marland Kitchen topiramate (TOPAMAX) 25 MG tablet Take 3 tablets (75 mg total) by mouth 2 (two) times daily.     Allergies:   Depakote [divalproex sodium] and Penicillins   Social History   Social History  . Marital status: Divorced    Spouse name: N/A  . Number of children: 2  . Years of education: 12   Occupational History  . retired    Social History Main Topics  . Smoking status: Former Smoker    Packs/day: 0.33    Years: 35.00    Types: Cigarettes    Quit date: 06/11/2016  . Smokeless tobacco: Former Systems developer    Quit date: 11/20/2012  . Alcohol use Yes     Comment: 06/11/2016 "might have a few drinks/month; usually wine"  . Drug use: No  . Sexual activity: Not Currently   Other Topics Concern  . None   Social History Narrative   Patient drinks about 1 cup of coffee daily.   Patient is right handed.      Family History: The patient's family history includes Cancer in her father; Diabetes in her brother, brother, and father; Heart attack in her sister; Heart disease in her brother and sister; Stroke in her brother.  ROS:   Please see the history of present illness.     All other systems reviewed and are negative.  EKGs/Labs/Other Studies Reviewed:    The following studies were reviewed today: 2D echo with normal LVF.  Carotid dopplers with 1-39% stenosis Cardiac cath 05/2016 with moderate LM stenosis, mild LCx and RCA stenosis and occluded LAD with patent LIMA to LAD, free radial to OM  EKG:  EKG is  ordered today.  The ekg ordered today demonstrates sinus bradycardia at 58bpm  with T wave inversions in inferolateral leads unchanged from a year ago  Recent Labs: 06/10/2016: ALT 17 06/11/2016: BUN 13; Creatinine, Ser 0.98; Hemoglobin 12.1; Platelets 206; Potassium 4.1; Sodium 140   Recent Lipid Panel    Component Value Date/Time   CHOL 216 (H) 06/11/2016 0521   TRIG 86 06/11/2016 0521   HDL 48 06/11/2016 0521   CHOLHDL 4.5 06/11/2016 0521   VLDL 17 06/11/2016 0521  LDLCALC 151 (H) 06/11/2016 0932    Physical Exam:    VS:  BP (!) 142/88   Pulse (!) 58   Ht 5\' 1"  (1.549 m)   Wt 147 lb 6.4 oz (66.9 kg)   BMI 27.85 kg/m     Wt Readings from Last 3 Encounters:  04/26/17 147 lb 6.4 oz (66.9 kg)  01/25/17 140 lb 12.8 oz (63.9 kg)  10/18/16 143 lb 3.2 oz (65 kg)     GEN:  Well nourished, well developed in no acute distress HEENT: Normal NECK: No JVD; No carotid bruits LYMPHATICS: No lymphadenopathy CARDIAC: RRR, no murmurs, rubs, gallops RESPIRATORY:  Clear to auscultation without rales, wheezing or rhonchi  ABDOMEN: Soft, non-tender, non-distended MUSCULOSKELETAL:  No edema; No deformity  SKIN: Warm and dry NEUROLOGIC:  Alert and oriented x 3 PSYCHIATRIC:  Normal affect   ASSESSMENT:    1. Atherosclerosis of native coronary artery of native heart without angina pectoris   2. Essential hypertension   3. Asymptomatic bilateral carotid artery stenosis   4. Dyslipidemia   5. Localized edema   6. OSA (obstructive sleep apnea)    PLAN:    In order of problems listed above:  1.  ASCAD - s/p CABG with cath 05/2016 with moderate LM stenosis, mild LCx and RCA stenosis and occluded LAD with patent LIMA to LAD, free radial to OM.  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  CP stable on long acting nitrates and only occurs when she gets upset. She says that taking her anxiety meds relieve her CP.  She will continue on ASA, Plavix, Imdur 30mg  daily, statin and Toprol 25mg  daily.   2.  HTN - BP is well controlled on current meds. She will continue  Lisinopril 20mg  daily and Toprol XL 25mg  daily.  I will check a BMET today.  3.  Bilateral carotid artery stenosis 1-39% - repeat dopplers 07/2017.  Continue ASA and statin.   4.  Hyperlipidemia with LDL goal < 70.  She will continue on atorvastatin 80mg  daily.  I will get an FLp and ALT.   5.  LE edema - improved after stopping amlodipine.  Her legs are swollen today and likely due to dietary indiscretion.  I will give her a Rx for Lasix 20mg  daily PRN for swelling.   6.  OSA - she lost her CPAP device during the move to [place] and is snoring and feeling tired during the day.  I will get a split night sleep study to get her back on CPAP.    Medication Adjustments/Labs and Tests Ordered: Current medicines are reviewed at length with the patient today.  Concerns regarding medicines are outlined above.  No orders of the defined types were placed in this encounter.  No orders of the defined types were placed in this encounter.   Signed, Fransico Him, MD  04/26/2017 8:49 AM    [facility]

## 2017-04-27 ENCOUNTER — Telehealth: Payer: Self-pay | Admitting: *Deleted

## 2017-04-27 NOTE — Telephone Encounter (Signed)
Informed patient of upcoming sleep study and patient understanding was verbalized. Patient understands her sleep study is scheduled for Monday June 20 2017. Patient understands her sleep study will be done at [facility] sleep lab. Patient understands she will receive a sleep packet in a week or so. Patient understands to call if she does not receive the sleep packet in a timely manner. Patient agrees with treatment and thanked me for call.

## 2017-04-27 NOTE — Telephone Encounter (Signed)
-----   Message from Theodoro Parma, RN sent at 04/26/2017 10:00 AM EDT ----- Regarding: sleep study Sleep study ordered for scheduling in clinic today. ESS= 17  Thanks!

## 2017-05-09 ENCOUNTER — Encounter: Payer: Self-pay | Admitting: Neurology

## 2017-05-09 ENCOUNTER — Ambulatory Visit (INDEPENDENT_AMBULATORY_CARE_PROVIDER_SITE_OTHER): Payer: Medicare HMO | Admitting: Neurology

## 2017-05-09 VITALS — BP 130/80 | HR 73 | Ht 61.0 in | Wt 144.5 lb

## 2017-05-09 DIAGNOSIS — G40309 Generalized idiopathic epilepsy and epileptic syndromes, not intractable, without status epilepticus: Secondary | ICD-10-CM

## 2017-05-09 DIAGNOSIS — G441 Vascular headache, not elsewhere classified: Secondary | ICD-10-CM | POA: Diagnosis not present

## 2017-05-09 NOTE — Progress Notes (Signed)
Reason for visit: Headache, seizures  Samantha Huffman is an 61 y.o. female  History of present illness:  Samantha Huffman is a 61 year old right-handed black female with a history of migraine headaches and a history of seizures. The patient has not had any seizures since last seen 6 months ago. She is able to operate a motor vehicle without difficulty. She has noted over the last 2 months that her headaches are becoming more frequent, they may occur on average twice a week. The patient will take a nap when she feels the headache coming on, when she wakes up the headache is gone. She is on Topamax a 50 mg twice daily for seizures and for the headache, she also takes Keppra 500 mg twice daily. She returns to the office today for an evaluation. She tolerates the Keppra and the Topamax well.  Past Medical History:  Diagnosis Date  . Carotid artery stenosis    a. Last carotid 07/2015 - 1-39% stenosis .  Marland Kitchen Coronary artery disease    a. s/p CABG 10/2012.  cath 05/2016 with moderate LM stenosis, mild LCx and RCA stenosis and occluded LAD with patent LIMA to LAD, free radial to OM.  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  Marland Kitchen Dyslipidemia   . Generalized convulsive epilepsy without mention of intractable epilepsy   . Hypertension   . Memory deficit   . Migraine    "controlled on daily RX " (06/11/2016)  . Nephrolithiasis 07/30/2015   S/p lithotripsy x 3 and right and left ureter stents  . OSA (obstructive sleep apnea)    hx; was on CPAP; haven't needed it since OHS" (06/11/2016)  . PONV (postoperative nausea and vomiting)   . Seizures ([place])    "controlled w/daily RX; started in 2012; dr thinks they might be from the migraines; last sz was early part of 2016" (06/11/2016)  . Type II diabetes mellitus ([place])    diet controlled (06/11/2016)  . Vasovagal syncope 07/30/2015   2011    Past Surgical History:  Procedure Laterality Date  . CARDIAC CATHETERIZATION  2014; 06/11/2016  . CARDIAC  CATHETERIZATION N/A 06/11/2016   Procedure: Left Heart Cath and Cors/Grafts Angiography;  Surgeon: Sherren Mocha, MD;  Location: [facility];  Service: Cardiovascular;  Laterality: N/A;  . CESAREAN SECTION    . CORONARY ANGIOPLASTY    . CORONARY ARTERY BYPASS GRAFT  March, 2014   LIMA to LAD, left radial to LCx  . LITHOTRIPSY  X 3  . TOTAL ABDOMINAL HYSTERECTOMY    . TUBAL LIGATION    . URETERAL STENT PLACEMENT      Family History  Problem Relation Age of Onset  . Cancer Father   . Diabetes Father   . Diabetes Brother   . Stroke Brother   . Diabetes Brother   . Heart disease Brother        CAD with CABG  . Heart disease Sister        CAD with MI  . Heart attack Sister     Social history:  reports that she quit smoking about 10 months ago. Her smoking use included Cigarettes. She has a 11.55 pack-year smoking history. She quit smokeless tobacco use about 4 years ago. She reports that she drinks alcohol. She reports that she does not use drugs.    Allergies  Allergen Reactions  . Depakote [Divalproex Sodium] Other (See Comments)    Hyperammonemia  . Penicillins     Other reaction(s): GI  intolerance Has patient had a PCN reaction causing immediate rash, facial/tongue/throat swelling, SOB or lightheadedness with hypotension: YES Has patient had a PCN reaction causing severe rash involving mucus membranes or skin necrosis: NO Has patient had a PCN reaction that required hospitalizationNO Has patient had a PCN reaction occurring within the last 10 years: NO If all of the above answers are "NO", then may proceed with Cephalosporin use.    Medications:  Prior to Admission medications   Medication Sig Start Date End Date Taking? Authorizing Provider  acetaminophen (TYLENOL) 325 MG tablet Take 2 tablets (650 mg total) by mouth every 4 (four) hours as needed for headache or mild pain. 06/11/16  Yes Isaiah Serge, NP  ALPRAZolam (XANAX) 0.25 MG tablet TAKE 1 TABLET BY MOUTH  3 TIMES A DAY AS NEEDED FOR ANXIETY 01/18/17  Yes [provider]  aspirin 81 MG tablet Take 81 mg by mouth daily.   Yes [provider]  atorvastatin (LIPITOR) 80 MG tablet Take 1 tablet (80 mg total) by mouth daily. 02/07/17 02/02/18 Yes Turner, Eber Hong, MD  clopidogrel (PLAVIX) 75 MG tablet TAKE ONE TABLET BY MOUTH DAILY 02/01/17  Yes Turner, Traci R, MD  escitalopram (LEXAPRO) 10 MG tablet Take 10 mg by mouth daily.   Yes [provider]  ezetimibe (ZETIA) 10 MG tablet Take 1 tablet (10 mg total) by mouth daily. 08/09/16  Yes Turner, Eber Hong, MD  furosemide (LASIX) 20 MG tablet Take 1 tablet (20 mg total) by mouth daily as needed. 04/26/17 07/25/17 Yes Turner, Eber Hong, MD  isosorbide mononitrate (IMDUR) 30 MG 24 hr tablet Take 0.5 tablets (15 mg total) by mouth daily. 08/09/16 08/04/17 Yes Turner, Eber Hong, MD  levETIRAcetam (KEPPRA) 500 MG tablet Take 1 tablet (500 mg total) by mouth 2 (two) times daily. 10/18/16  Yes Ward Givens, NP  lisinopril (PRINIVIL,ZESTRIL) 20 MG tablet Take 1 tablet (20 mg total) by mouth daily. 02/07/17  Yes Turner, Eber Hong, MD  metoprolol succinate (TOPROL-XL) 25 MG 24 hr tablet Take 1 tablet (25 mg total) by mouth daily. 08/09/16  Yes Turner, Eber Hong, MD  nitroGLYCERIN (NITROSTAT) 0.4 MG SL tablet Place 1 tablet (0.4 mg total) under the tongue every 5 (five) minutes x 3 doses as needed for chest pain. 06/11/16  Yes Isaiah Serge, NP  topiramate (TOPAMAX) 25 MG tablet Take 3 tablets (75 mg total) by mouth 2 (two) times daily. 10/18/16  Yes Ward Givens, NP    ROS:  Out of a complete 14 system review of symptoms, the patient complains only of the following symptoms, and all other reviewed systems are negative.  Headache  Blood pressure 130/80, pulse 73, height 5\' 1"  (1.549 m), weight 144 lb 8 oz (65.5 kg).  Physical Exam  General: The patient is alert and cooperative at the time of the examination.  Skin: No significant peripheral  edema is noted.   Neurologic Exam  Mental status: The patient is alert and oriented x 3 at the time of the examination. The patient has apparent normal recent and remote memory, with an apparently normal attention span and concentration ability.   Cranial nerves: Facial symmetry is present. Speech is normal, no aphasia or dysarthria is noted. Extraocular movements are full. Visual fields are full.  Motor: The patient has good strength in all 4 extremities.  Sensory examination: Soft touch sensation is symmetric on the face, arms, and legs.  Coordination: The patient has good finger-nose-finger and heel-to-shin bilaterally.  Gait and station: The patient has a normal gait. Tandem gait is normal. Romberg is negative. No drift is seen.  Reflexes: Deep tendon reflexes are symmetric.   Assessment/Plan:  1. History of seizures  2. Migraine headache  The patient has noted an increase in her headache frequency. I have recommended going up on the Topamax taking 100 mg twice daily, the patient does not wish to do this medication change for now. She will call if the headaches are becoming more frequent. She is doing well with her seizure control, we will continue the [place] as well, she will follow-up in 6 months.  Jill Alexanders MD 05/09/2017 12:11 PM  [facility] [address] [place], [place] [address]  Phone [phone] Fax [phone]

## 2017-05-24 ENCOUNTER — Encounter (HOSPITAL_COMMUNITY): Payer: Self-pay | Admitting: *Deleted

## 2017-05-24 ENCOUNTER — Emergency Department (HOSPITAL_COMMUNITY)
Admission: EM | Admit: 2017-05-24 | Discharge: 2017-05-24 | Disposition: A | Discharge status: Home / Self Care | Payer: Medicare HMO | Attending: Emergency Medicine | Admitting: Emergency Medicine

## 2017-05-24 ENCOUNTER — Emergency Department (HOSPITAL_COMMUNITY): Payer: Medicare HMO

## 2017-05-24 DIAGNOSIS — R079 Chest pain, unspecified: Secondary | ICD-10-CM | POA: Diagnosis not present

## 2017-05-24 DIAGNOSIS — B9789 Other viral agents as the cause of diseases classified elsewhere: Secondary | ICD-10-CM

## 2017-05-24 DIAGNOSIS — Z87891 Personal history of nicotine dependence: Secondary | ICD-10-CM | POA: Diagnosis not present

## 2017-05-24 DIAGNOSIS — Z7902 Long term (current) use of antithrombotics/antiplatelets: Secondary | ICD-10-CM | POA: Insufficient documentation

## 2017-05-24 DIAGNOSIS — Z951 Presence of aortocoronary bypass graft: Secondary | ICD-10-CM | POA: Diagnosis not present

## 2017-05-24 DIAGNOSIS — F17210 Nicotine dependence, cigarettes, uncomplicated: Secondary | ICD-10-CM | POA: Diagnosis not present

## 2017-05-24 DIAGNOSIS — I2510 Atherosclerotic heart disease of native coronary artery without angina pectoris: Secondary | ICD-10-CM | POA: Diagnosis not present

## 2017-05-24 DIAGNOSIS — I10 Essential (primary) hypertension: Secondary | ICD-10-CM | POA: Insufficient documentation

## 2017-05-24 DIAGNOSIS — Z79899 Other long term (current) drug therapy: Secondary | ICD-10-CM | POA: Insufficient documentation

## 2017-05-24 DIAGNOSIS — R0789 Other chest pain: Secondary | ICD-10-CM | POA: Diagnosis present

## 2017-05-24 DIAGNOSIS — E119 Type 2 diabetes mellitus without complications: Secondary | ICD-10-CM | POA: Insufficient documentation

## 2017-05-24 DIAGNOSIS — R9431 Abnormal electrocardiogram [ECG] [EKG]: Secondary | ICD-10-CM | POA: Diagnosis not present

## 2017-05-24 DIAGNOSIS — R69 Illness, unspecified: Secondary | ICD-10-CM | POA: Diagnosis not present

## 2017-05-24 DIAGNOSIS — Z7982 Long term (current) use of aspirin: Secondary | ICD-10-CM | POA: Insufficient documentation

## 2017-05-24 DIAGNOSIS — J069 Acute upper respiratory infection, unspecified: Secondary | ICD-10-CM

## 2017-05-24 DIAGNOSIS — R05 Cough: Secondary | ICD-10-CM | POA: Diagnosis not present

## 2017-05-24 LAB — BASIC METABOLIC PANEL
Anion gap: 7 (ref 5–15)
BUN: 10 mg/dL (ref 6–20)
CHLORIDE: 110 mmol/L (ref 101–111)
CO2: 21 mmol/L — ABNORMAL LOW (ref 22–32)
CREATININE: 0.98 mg/dL (ref 0.44–1.00)
Calcium: 9.4 mg/dL (ref 8.9–10.3)
Glucose, Bld: 112 mg/dL — ABNORMAL HIGH (ref 65–99)
Potassium: 4 mmol/L (ref 3.5–5.1)
SODIUM: 138 mmol/L (ref 135–145)

## 2017-05-24 LAB — CBC
HCT: 39.8 % (ref 36.0–46.0)
Hemoglobin: 13.2 g/dL (ref 12.0–15.0)
MCH: 30.7 pg (ref 26.0–34.0)
MCHC: 33.2 g/dL (ref 30.0–36.0)
MCV: 92.6 fL (ref 78.0–100.0)
PLATELETS: 180 10*3/uL (ref 150–400)
RBC: 4.3 MIL/uL (ref 3.87–5.11)
RDW: 12.9 % (ref 11.5–15.5)
WBC: 5.3 10*3/uL (ref 4.0–10.5)

## 2017-05-24 LAB — I-STAT TROPONIN, ED: TROPONIN I, POC: 0.01 ng/mL (ref 0.00–0.08)

## 2017-05-24 IMAGING — CR DG CHEST 2V
2 series · 2 of 2 positions shown · non-contrast
Comparison: [DATE]

CLINICAL DATA: Bilateral chest pain under the breasts since
yesterday.

EXAM:
CHEST  2 VIEW

[chest pa]
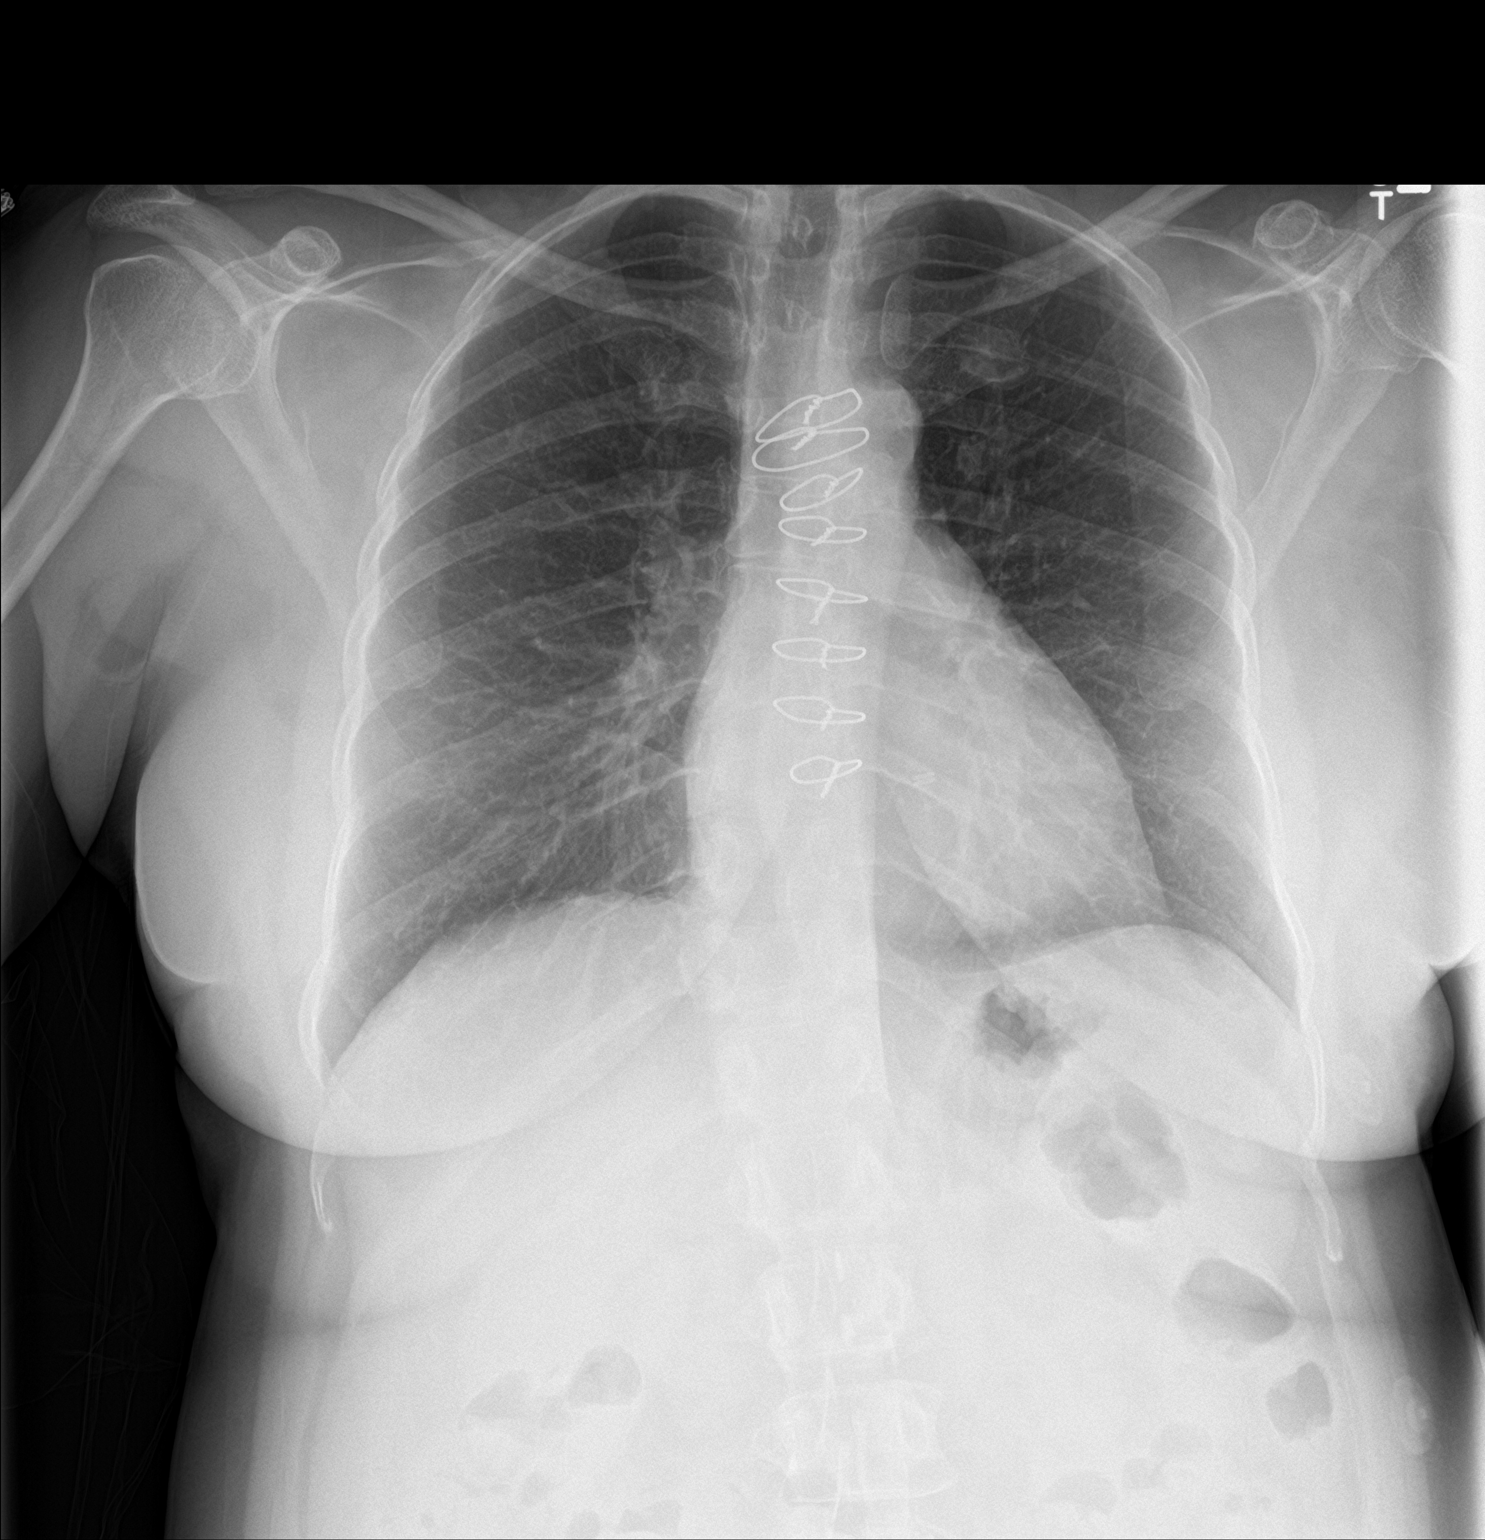

[chest lat]
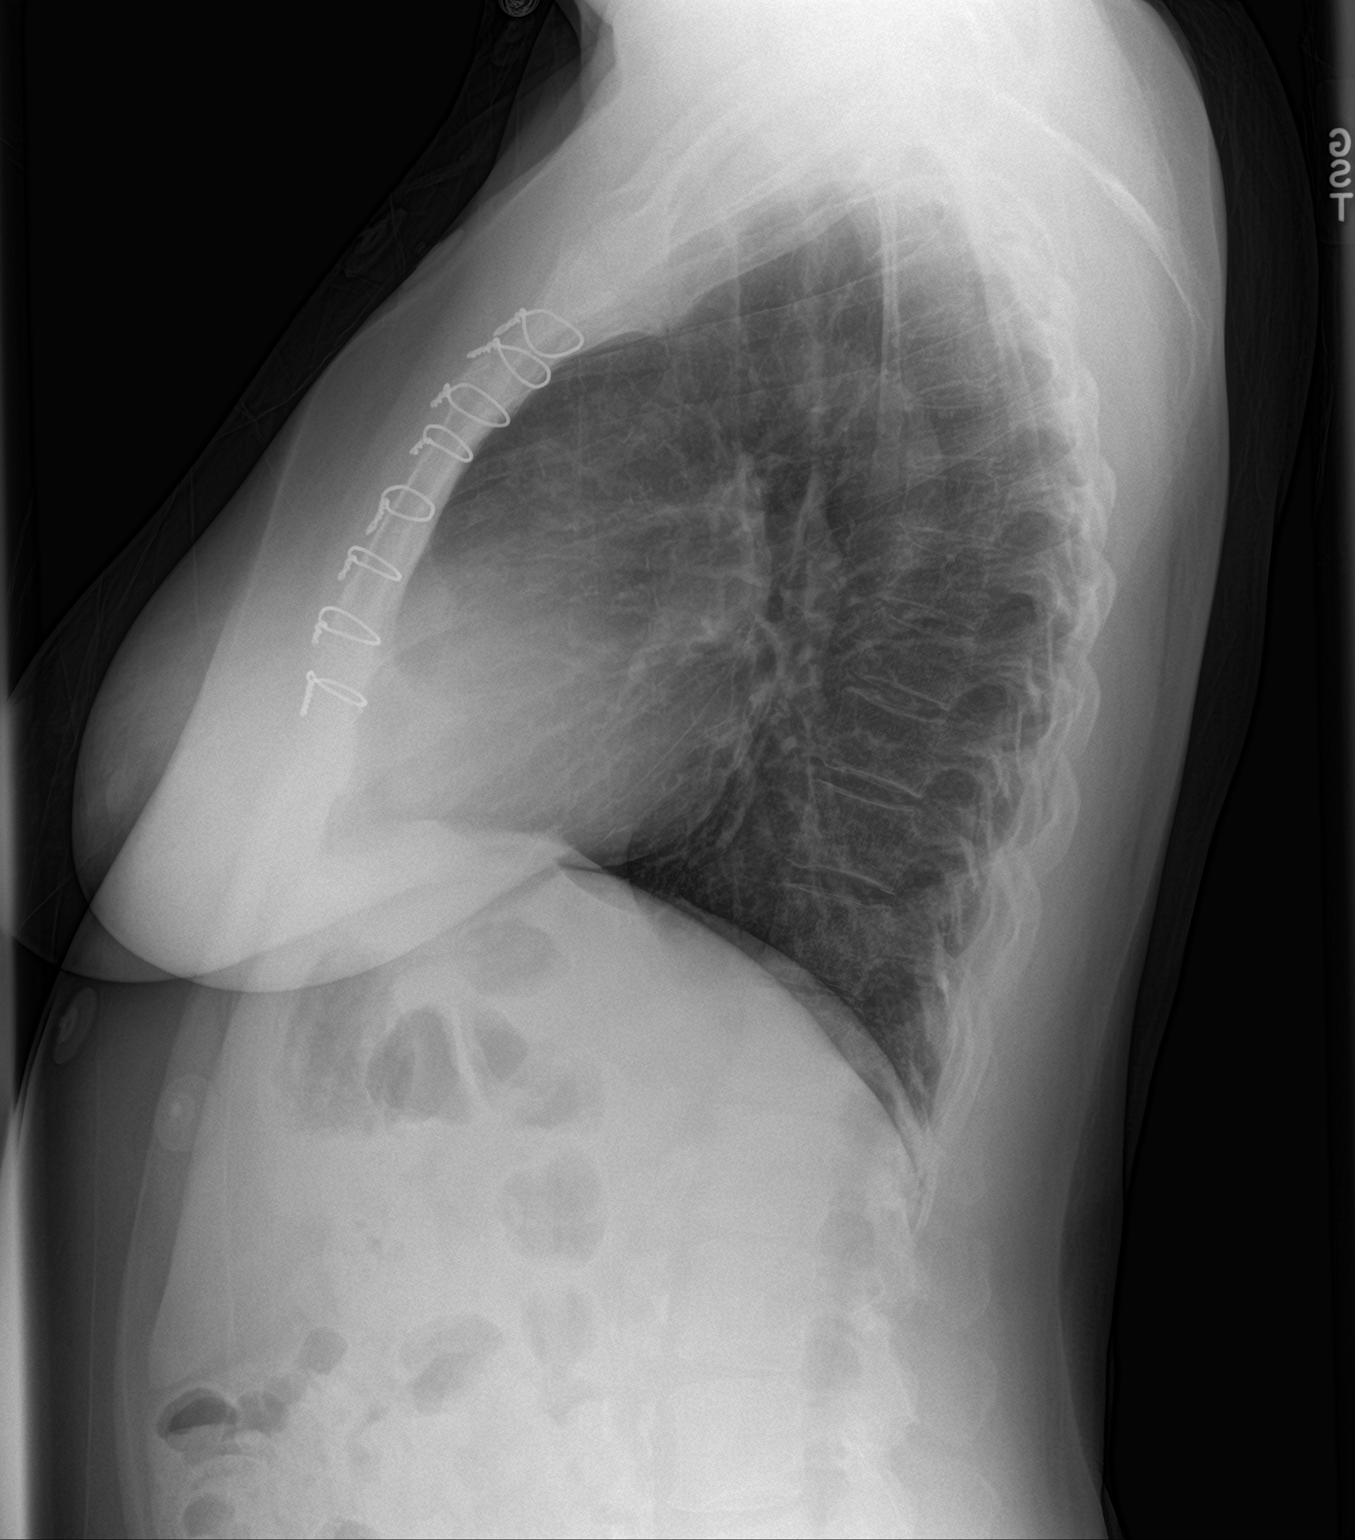

[2 of 2 positions shown; findings below may reference images not displayed]

FINDINGS: Postoperative changes in the mediastinum. Normal heart size and
pulmonary vascularity. No focal airspace disease or consolidation in
the lungs. No blunting of costophrenic angles. No pneumothorax.
Mediastinal contours appear intact.
IMPRESSION: No active cardiopulmonary disease.

## 2017-05-24 MED ORDER — FEXOFENADINE HCL 60 MG PO TABS: 60.0000 mg | ORAL_TABLET | Freq: Two times a day (BID) | ORAL | 0 refills | Status: DC

## 2017-05-24 MED ORDER — BENZONATATE 100 MG PO CAPS
100.0000 mg | ORAL_CAPSULE | Freq: Once | ORAL | Status: AC
  Administered 2017-05-24: 100 mg via ORAL
  Filled 2017-05-24: qty 1

## 2017-05-24 MED ORDER — BENZONATATE 100 MG PO CAPS: 100.0000 mg | ORAL_CAPSULE | Freq: Three times a day (TID) | ORAL | 0 refills | Status: DC

## 2017-05-24 MED ORDER — LORATADINE 10 MG PO TABS
10.0000 mg | ORAL_TABLET | Freq: Every day | ORAL | Status: DC
  Administered 2017-05-24: 10 mg via ORAL
  Filled 2017-05-24: qty 1

## 2017-05-24 NOTE — ED Provider Notes (Signed)
[facility] DEPT Provider Note   CSN: [id number] Arrival date & time: 05/24/17  0342     History   Chief Complaint Chief Complaint  Patient presents with  . Chest Pain    HPI Samantha Huffman is a 61 y.o. female.  HPI Pt with hx of CAD, DM, HL comes in with cc of chest pain. Pt reports that her pain started 1 week ago with some nasal congestion, teary eyes, cough. Pt has chest pain with cough and deep inspiration. Pt's cough is producing clear. Pt also has body aches. Pt has no fevers, but has nausea and chills. Patient has no pain with urination, blood in the urine, or frequent urination. Pt also denies any vaginal discharge or bleeding. Pt has no hx of PE, DVT and denies any exogenous hormone (testosterone / estrogen) use, long distance travels or surgery in the past 6 weeks, active cancer, recent immobilization.   Past Medical History:  Diagnosis Date  . Carotid artery stenosis    a. Last carotid 07/2015 - 1-39% stenosis .  Marland Kitchen Coronary artery disease    a. s/p CABG 10/2012.  cath 05/2016 with moderate LM stenosis, mild LCx and RCA stenosis and occluded LAD with patent LIMA to LAD, free radial to OM.  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  Marland Kitchen Dyslipidemia   . Generalized convulsive epilepsy without mention of intractable epilepsy   . Hypertension   . Memory deficit   . Migraine    "controlled on daily RX " (06/11/2016)  . Nephrolithiasis 07/30/2015   S/p lithotripsy x 3 and right and left ureter stents  . OSA (obstructive sleep apnea)    hx; was on CPAP; haven't needed it since OHS" (06/11/2016)  . PONV (postoperative nausea and vomiting)   . Seizures ([place])    "controlled w/daily RX; started in 2012; dr thinks they might be from the migraines; last sz was early part of 2016" (06/11/2016)  . Type II diabetes mellitus ([place])    diet controlled (06/11/2016)  . Vasovagal syncope 07/30/2015   2011    Patient Active Problem List   Diagnosis Date Noted  . Edema 01/25/2017    . Tobacco abuse 06/10/2016  . Headache 09/15/2015  . OSA (obstructive sleep apnea) 07/30/2015  . Nephrolithiasis 07/30/2015  . Vasovagal syncope 07/30/2015  . Carotid artery stenosis, asymptomatic 07/30/2015  . Coronary atherosclerosis of native coronary artery 08/22/2013  . Essential hypertension 08/22/2013  . Diabetes mellitus without complication ([place])   . Dyslipidemia   . Seizures ([place])   . Generalized convulsive epilepsy ([place]) 03/22/2013  . Memory deficit 03/22/2013    Past Surgical History:  Procedure Laterality Date  . CARDIAC CATHETERIZATION  2014; 06/11/2016  . CARDIAC CATHETERIZATION N/A 06/11/2016   Procedure: Left Heart Cath and Cors/Grafts Angiography;  Surgeon: Sherren Mocha, MD;  Location: [facility];  Service: Cardiovascular;  Laterality: N/A;  . CESAREAN SECTION    . CORONARY ANGIOPLASTY    . CORONARY ARTERY BYPASS GRAFT  March, 2014   LIMA to LAD, left radial to LCx  . LITHOTRIPSY  X 3  . TOTAL ABDOMINAL HYSTERECTOMY    . TUBAL LIGATION    . URETERAL STENT PLACEMENT      OB History    No data available       Home Medications    Prior to Admission medications   Medication Sig Start Date End Date Taking? Authorizing Provider  acetaminophen (TYLENOL) 325 MG tablet Take 2 tablets (650 mg total)  by mouth every 4 (four) hours as needed for headache or mild pain. 06/11/16   Isaiah Serge, NP  ALPRAZolam Duanne Moron) 0.25 MG tablet TAKE 1 TABLET BY MOUTH 3 TIMES A DAY AS NEEDED FOR ANXIETY 01/18/17   [provider]  aspirin 81 MG tablet Take 81 mg by mouth daily.    [provider]  atorvastatin (LIPITOR) 80 MG tablet Take 1 tablet (80 mg total) by mouth daily. 02/07/17 02/02/18  Sueanne Margarita, MD  clopidogrel (PLAVIX) 75 MG tablet TAKE ONE TABLET BY MOUTH DAILY 02/01/17   Sueanne Margarita, MD  escitalopram (LEXAPRO) 10 MG tablet Take 10 mg by mouth daily.    [provider]  ezetimibe (ZETIA) 10 MG tablet Take 1 tablet (10 mg  total) by mouth daily. 08/09/16   Sueanne Margarita, MD  furosemide (LASIX) 20 MG tablet Take 1 tablet (20 mg total) by mouth daily as needed. 04/26/17 07/25/17  Sueanne Margarita, MD  isosorbide mononitrate (IMDUR) 30 MG 24 hr tablet Take 0.5 tablets (15 mg total) by mouth daily. 08/09/16 08/04/17  Sueanne Margarita, MD  levETIRAcetam (KEPPRA) 500 MG tablet Take 1 tablet (500 mg total) by mouth 2 (two) times daily. 10/18/16   Ward Givens, NP  lisinopril (PRINIVIL,ZESTRIL) 20 MG tablet Take 1 tablet (20 mg total) by mouth daily. 02/07/17   Sueanne Margarita, MD  metoprolol succinate (TOPROL-XL) 25 MG 24 hr tablet Take 1 tablet (25 mg total) by mouth daily. 08/09/16   Sueanne Margarita, MD  nitroGLYCERIN (NITROSTAT) 0.4 MG SL tablet Place 1 tablet (0.4 mg total) under the tongue every 5 (five) minutes x 3 doses as needed for chest pain. 06/11/16   Isaiah Serge, NP  topiramate (TOPAMAX) 25 MG tablet Take 3 tablets (75 mg total) by mouth 2 (two) times daily. 10/18/16   Ward Givens, NP    Family History Family History  Problem Relation Age of Onset  . Cancer Father   . Diabetes Father   . Diabetes Brother   . Stroke Brother   . Diabetes Brother   . Heart disease Brother        CAD with CABG  . Heart disease Sister        CAD with MI  . Heart attack Sister     Social History Social History  Substance Use Topics  . Smoking status: Current Some Day Smoker    Packs/day: 0.33    Years: 35.00    Types: Cigarettes    Last attempt to quit: 06/11/2016  . Smokeless tobacco: Former Systems developer    Quit date: 11/20/2012  . Alcohol use Yes     Comment: 06/11/2016 "might have a few drinks/month; usually wine"     Allergies   Depakote [divalproex sodium] and Penicillins   Review of Systems Review of Systems  Constitutional: Positive for activity change and chills.  HENT: Positive for congestion, postnasal drip, rhinorrhea and sinus pressure. Negative for drooling and sore throat.   Eyes: Positive for  discharge and redness.  Respiratory: Positive for cough and shortness of breath. Negative for wheezing.   Cardiovascular: Positive for chest pain.  Gastrointestinal: Positive for nausea.  Genitourinary: Negative for dysuria.  Skin: Negative for rash.  Allergic/Immunologic: Negative for immunocompromised state.  All other systems reviewed and are negative.    Physical Exam Updated Vital Signs BP (!) 138/97 (BP Location: Left Arm)   Pulse 79   Temp 98.1 F (36.7 C)   Resp  18   Ht 5\' 1"  (1.549 m)   Wt 65.8 kg (145 lb)   SpO2 100%   BMI 27.40 kg/m   Physical Exam  Constitutional: She is oriented to person, place, and time. She appears well-developed.  HENT:  Head: Normocephalic and atraumatic.  Mouth/Throat: Oropharynx is clear and moist. No oropharyngeal exudate.  Eyes: EOM are normal.  Neck: Normal range of motion. Neck supple.  Cardiovascular: Normal rate.   Pulmonary/Chest: Effort normal. No respiratory distress. She has no wheezes.  Abdominal: Bowel sounds are normal.  Lymphadenopathy:    She has no cervical adenopathy.  Neurological: She is alert and oriented to person, place, and time.  Skin: Skin is warm and dry.  Nursing note and vitals reviewed.    ED Treatments / Results  Labs (all labs ordered are listed, but only abnormal results are displayed) Labs Reviewed  BASIC METABOLIC PANEL - Abnormal; Notable for the following:       Result Value   CO2 21 (*)    Glucose, Bld 112 (*)    All other components within normal limits  CBC  I-STAT TROPONIN, ED    EKG  EKG Interpretation  Date/Time:  Tuesday May 24 2017 03:48:33 EDT Ventricular Rate:  77 PR Interval:  158 QRS Duration: 90 QT Interval:  360 QTC Calculation: 407 R Axis:   69 Text Interpretation:  Normal sinus rhythm Nonspecific T wave abnormality Abnormal ECG TWI in the inferior leads are worse TWI in the lateral leads unchanged Confirmed by Varney Biles [phone]) on 05/24/2017 4:51:19 AM         Radiology Dg Chest 2 View  Result Date: 05/24/2017 CLINICAL DATA:  Bilateral chest pain under the breasts since yesterday. EXAM: CHEST  2 VIEW COMPARISON:  02/12/2013 FINDINGS: Postoperative changes in the mediastinum. Normal heart size and pulmonary vascularity. No focal airspace disease or consolidation in the lungs. No blunting of costophrenic angles. No pneumothorax. Mediastinal contours appear intact. IMPRESSION: No active cardiopulmonary disease. Electronically Signed   By: Lucienne Capers M.D.   On: 05/24/2017 04:22    Procedures Procedures (including critical care time)  Medications Ordered in ED Medications  loratadine (CLARITIN) tablet 10 mg (not administered)  benzonatate (TESSALON) capsule 100 mg (not administered)     Initial Impression / Assessment and Plan / ED Course  I have reviewed the triage vital signs and the nursing notes.  Pertinent labs & imaging results that were available during my care of the patient were reviewed by me and considered in my medical decision making (see chart for details).     Pt comes in with what appears like URI like symptoms. Pt's symptoms started > 2 days ago, doubt flu and well outside window for tamiflu. Pt is not toxic appearing. EKG and trops are reassuring. Lung exam and CXR are clear. Strict ER return precautions have been discussed, and patient is agreeing with the plan and is comfortable with the workup done and the recommendations from the ER.   Final Clinical Impressions(s) / ED Diagnoses   Final diagnoses:  Viral URI with cough    New Prescriptions New Prescriptions   No medications on file     Varney Biles, MD 05/24/17 [phone]

## 2017-05-24 NOTE — ED Triage Notes (Signed)
C/o geneeralized chest pain onset yest . States pain is worse with inspiration and movement. C/o cough. And nasal congestion.

## 2017-05-24 NOTE — Discharge Instructions (Signed)
°  We think what you have is a viral syndrome - the treatment for which is symptomatic relief only, and your body will fight the infection off in a few days. We are prescribing you some meds for pain and fevers. See your primary care doctor in 1 week if the symptoms dont improve.

## 2017-05-24 NOTE — ED Notes (Signed)
Patient transported to X-ray 

## 2017-06-09 DIAGNOSIS — F320 Major depressive disorder, single episode, mild: Secondary | ICD-10-CM | POA: Diagnosis not present

## 2017-06-09 DIAGNOSIS — Z23 Encounter for immunization: Secondary | ICD-10-CM | POA: Diagnosis not present

## 2017-06-09 DIAGNOSIS — R69 Illness, unspecified: Secondary | ICD-10-CM | POA: Diagnosis not present

## 2017-06-20 ENCOUNTER — Encounter (HOSPITAL_BASED_OUTPATIENT_CLINIC_OR_DEPARTMENT_OTHER): Payer: Medicare HMO

## 2017-08-16 ENCOUNTER — Other Ambulatory Visit: Payer: Self-pay | Admitting: Cardiology

## 2017-08-22 ENCOUNTER — Other Ambulatory Visit: Payer: Self-pay | Admitting: Cardiology

## 2017-10-25 ENCOUNTER — Telehealth: Payer: Self-pay | Admitting: Neurology

## 2017-10-25 NOTE — Telephone Encounter (Signed)
Pt's daughter states the pt has rec'd a jury summons and is wanting a medical excuse from serving. She will need the letter prior 11/07/17. Please call to advise

## 2017-10-25 NOTE — Telephone Encounter (Signed)
I called the patient.  The patient does have a history of seizures and migraine headaches, she is fully capable of operating a motor vehicle.  She does have some mild memory issues, but she does not carry the diagnosis of dementia.  I do not believe that she qualifies for an excuse letter for jury duty.  She is free to contact her primary care physician to see if that person would write a letter for her.  In the past, the patient is also requested a handicap sticker for her car, I did not think she qualified for that either.

## 2017-10-28 ENCOUNTER — Telehealth: Payer: Self-pay | Admitting: *Deleted

## 2017-10-28 DIAGNOSIS — G4733 Obstructive sleep apnea (adult) (pediatric): Secondary | ICD-10-CM

## 2017-10-28 NOTE — Telephone Encounter (Signed)
FW: New Sleep Study vs. New DME Lorin Mercy, MD  Cc: Freada Bergeron, CMA; Frutoso Schatz, Amy C        Dr. Radford Pax   I checked the website and it looks like they are actually processing the titration. It is pending Complete - Notify Process.   Hopefully by tomorrow its approved. If not will try again.   Thanks   Charmaine     ----- Message -----  From: Lillia Pauls  Sent: 06/21/2017  To: Lillia Pauls  Subject: FW: New Sleep Study vs. New DME            ----- Message -----  From: Sueanne Margarita, MD  Sent: 06/19/2017  5:35 PM  To: Lillia Pauls  Subject: RE: New Sleep Study vs. New DME          I dont understand if she has not been using her device and we dont know settings why they will not pay for a study   Traci Turner  ----- Message -----  From: Lillia Pauls  Sent: 06/16/2017  4:02 PM  To: Sueanne Margarita, MD  Subject: RE: New Sleep Study vs. New DME          I just talked to [facility] and you have already left for today and on vacation tomorrow and next week d for hospital.   Please let me know when you can do a peer review and the phone you would like them to call you at. I will r/s the md peer review  ----- Message -----  From: Sueanne Margarita, MD  Sent: 06/16/2017  2:45 PM  To: Lillia Pauls  Subject: RE: New Sleep Study vs. New DME          She needs at least a CPAP titration as she is having problems with daytime sleepiness and I dont know what pressure she was on   Traci  ----- Message -----  From: Lillia Pauls  Sent: 06/16/2017  2:35 PM  To: Sueanne Margarita, MD, Almyra Free, *  Subject: New Sleep Study vs. New DME            Odis Hollingshead please call pt. Amy submitted sleep study request. Case is pending medical review d/t pt had sleep study/titration 11/2008 and has dx of sleep apnea. They question since pt lost her CPAP equipment she can contact her DME  provider and tell them she lost the equipment and what does she need to get it replaced?   If they will replace it and don't require a new sleep study do you still want the pt to have a sleep study for any other reason?   Thanks   Charmaine

## 2017-10-28 NOTE — Telephone Encounter (Signed)
Per Dr. Radford Pax we are going to try and get a titration approved so when pt gets her new machine we know what pressure.

## 2017-10-29 ENCOUNTER — Emergency Department (HOSPITAL_COMMUNITY): Payer: Medicare HMO

## 2017-10-29 ENCOUNTER — Encounter (HOSPITAL_COMMUNITY): Payer: Self-pay | Admitting: Emergency Medicine

## 2017-10-29 ENCOUNTER — Observation Stay (HOSPITAL_COMMUNITY)
Admission: EM | Admit: 2017-10-29 | Discharge: 2017-10-30 | Disposition: A | Discharge status: Home / Self Care | Payer: Medicare HMO | Attending: Internal Medicine | Admitting: Internal Medicine

## 2017-10-29 ENCOUNTER — Other Ambulatory Visit: Payer: Self-pay

## 2017-10-29 DIAGNOSIS — I2510 Atherosclerotic heart disease of native coronary artery without angina pectoris: Secondary | ICD-10-CM | POA: Diagnosis present

## 2017-10-29 DIAGNOSIS — Z7902 Long term (current) use of antithrombotics/antiplatelets: Secondary | ICD-10-CM | POA: Insufficient documentation

## 2017-10-29 DIAGNOSIS — E119 Type 2 diabetes mellitus without complications: Secondary | ICD-10-CM | POA: Diagnosis not present

## 2017-10-29 DIAGNOSIS — Z951 Presence of aortocoronary bypass graft: Secondary | ICD-10-CM | POA: Insufficient documentation

## 2017-10-29 DIAGNOSIS — Z7982 Long term (current) use of aspirin: Secondary | ICD-10-CM | POA: Diagnosis not present

## 2017-10-29 DIAGNOSIS — R079 Chest pain, unspecified: Secondary | ICD-10-CM | POA: Diagnosis present

## 2017-10-29 DIAGNOSIS — G40309 Generalized idiopathic epilepsy and epileptic syndromes, not intractable, without status epilepticus: Secondary | ICD-10-CM | POA: Diagnosis not present

## 2017-10-29 DIAGNOSIS — F17210 Nicotine dependence, cigarettes, uncomplicated: Secondary | ICD-10-CM | POA: Insufficient documentation

## 2017-10-29 DIAGNOSIS — Z79899 Other long term (current) drug therapy: Secondary | ICD-10-CM | POA: Diagnosis not present

## 2017-10-29 DIAGNOSIS — I25811 Atherosclerosis of native coronary artery of transplanted heart without angina pectoris: Secondary | ICD-10-CM | POA: Insufficient documentation

## 2017-10-29 DIAGNOSIS — I10 Essential (primary) hypertension: Secondary | ICD-10-CM | POA: Diagnosis not present

## 2017-10-29 DIAGNOSIS — R0789 Other chest pain: Secondary | ICD-10-CM | POA: Diagnosis not present

## 2017-10-29 DIAGNOSIS — R69 Illness, unspecified: Secondary | ICD-10-CM | POA: Diagnosis not present

## 2017-10-29 LAB — BASIC METABOLIC PANEL
Anion gap: 9 (ref 5–15)
BUN: 9 mg/dL (ref 6–20)
CO2: 22 mmol/L (ref 22–32)
CREATININE: 0.84 mg/dL (ref 0.44–1.00)
Calcium: 9.6 mg/dL (ref 8.9–10.3)
Chloride: 108 mmol/L (ref 101–111)
GFR calc Af Amer: >60 mL/min (ref >60)
GFR calc non Af Amer: >60 mL/min (ref >60)
Glucose, Bld: 147 mg/dL — ABNORMAL HIGH (ref 65–99)
Potassium: 3.8 mmol/L (ref 3.5–5.1)
SODIUM: 139 mmol/L (ref 135–145)

## 2017-10-29 LAB — CBC
HCT: 38.5 % (ref 36.0–46.0)
Hemoglobin: 12.7 g/dL (ref 12.0–15.0)
MCH: 31.3 pg (ref 26.0–34.0)
MCHC: 33 g/dL (ref 30.0–36.0)
MCV: 94.8 fL (ref 78.0–100.0)
PLATELETS: 192 10*3/uL (ref 150–400)
RBC: 4.06 MIL/uL (ref 3.87–5.11)
RDW: 13.2 % (ref 11.5–15.5)
WBC: 6 10*3/uL (ref 4.0–10.5)

## 2017-10-29 LAB — I-STAT TROPONIN, ED: Troponin i, poc: 0 ng/mL (ref 0.00–0.08)

## 2017-10-29 IMAGING — DX DG CHEST 2V
2 series · 2 of 2 positions shown · non-contrast
Comparison: [DATE] chest radiograph.

CLINICAL DATA: Chest pain

EXAM:
CHEST - 2 VIEW

[w chest pa]
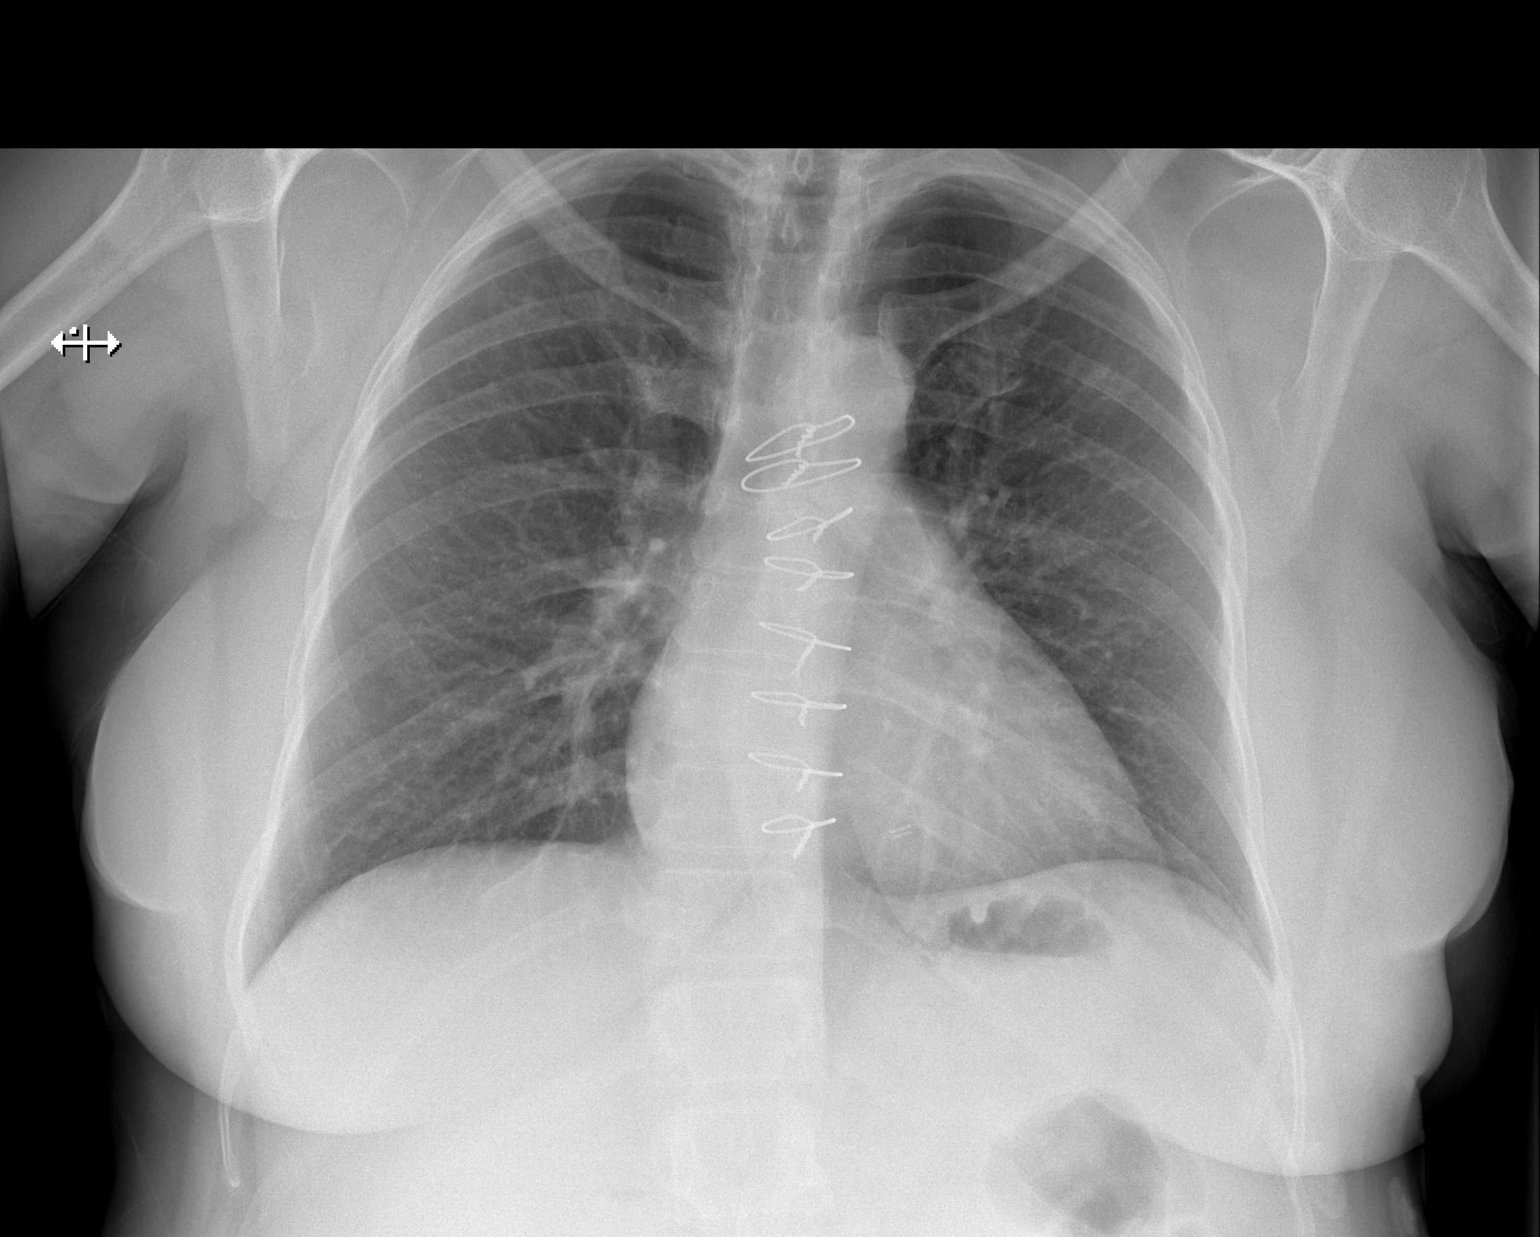

[w chest lat]
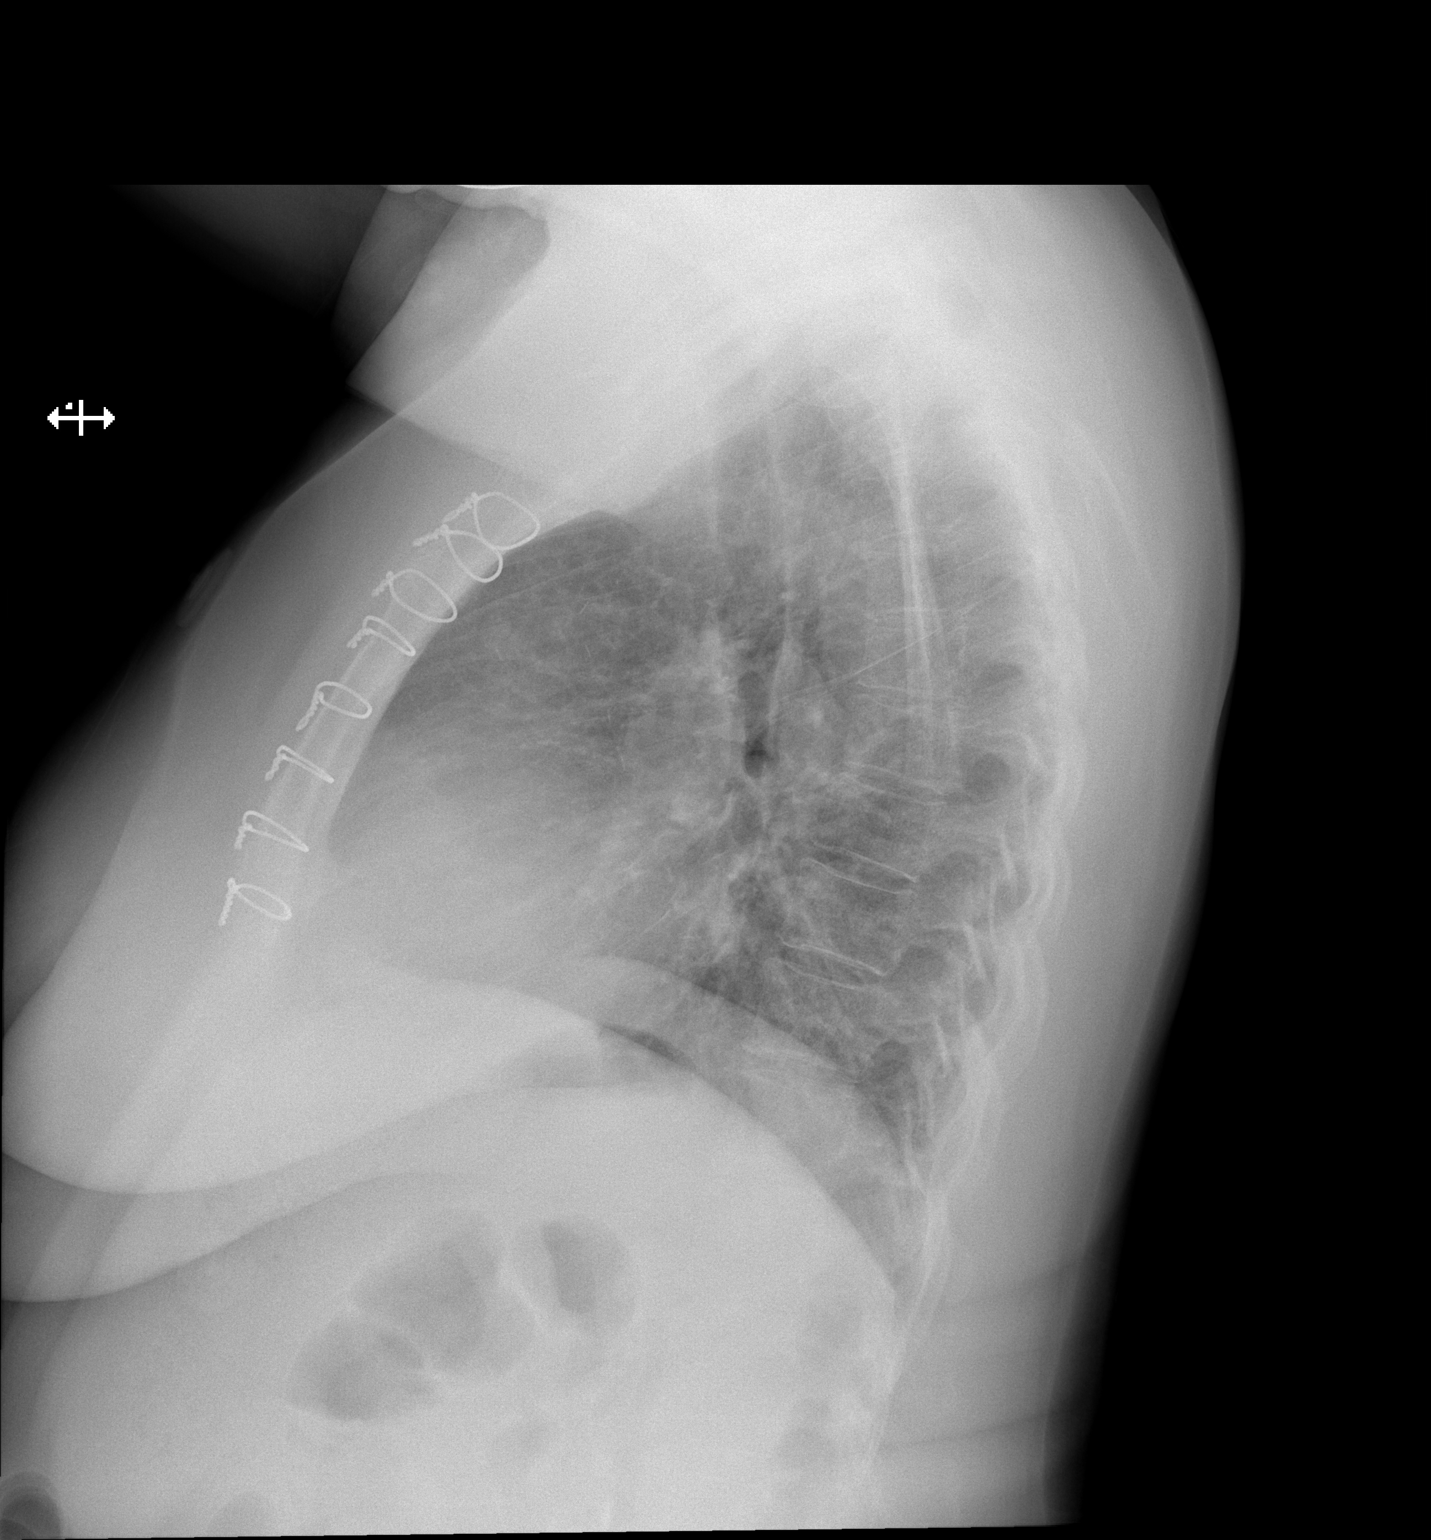

[2 of 2 positions shown; findings below may reference images not displayed]

FINDINGS: Intact sternotomy wires. Stable cardiomediastinal silhouette with
top-normal heart size. No pneumothorax. No pleural effusion. Lungs
appear clear, with no acute consolidative airspace disease and no
pulmonary edema.
IMPRESSION: No active cardiopulmonary disease.

## 2017-10-29 MED ORDER — ASPIRIN 81 MG PO CHEW
324.0000 mg | CHEWABLE_TABLET | Freq: Once | ORAL | Status: AC
  Administered 2017-10-30: 324 mg via ORAL
  Filled 2017-10-29: qty 4

## 2017-10-29 MED ORDER — NITROGLYCERIN 0.4 MG SL SUBL
0.4000 mg | SUBLINGUAL_TABLET | SUBLINGUAL | Status: DC | PRN
  Administered 2017-10-30: 0.4 mg via SUBLINGUAL
  Filled 2017-10-29: qty 1

## 2017-10-29 NOTE — ED Provider Notes (Signed)
[facility] EMERGENCY DEPARTMENT Provider Note   CSN: [id number] Arrival date & time: 10/29/17  2010     History   Chief Complaint Chief Complaint  Patient presents with  . Chest Pain    HPI Samantha Huffman is a 62 y.o. female.  Patient presents to the emergency department for evaluation of chest pain.  Patient does have a previous history of coronary artery disease, status post bypass surgery in 2014.  Patient reports that she has been having intermittent pain over the last several days.  She notes a pressure and heaviness over the chest.  When it first occurred she took nitroglycerin and it resolved.  Pain reoccurred this morning and only partially improved with nitroglycerin so she has come to the ER tonight for further evaluation.  She does report that the pressure is very mild right now but it worsens if she exerts herself, such as walking across the parking lot tonight.      Past Medical History:  Diagnosis Date  . Carotid artery stenosis    a. Last carotid 07/2015 - 1-39% stenosis .  Marland Kitchen Coronary artery disease    a. s/p CABG 10/2012.  cath 05/2016 with moderate LM stenosis, mild LCx and RCA stenosis and occluded LAD with patent LIMA to LAD, free radial to OM.  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  Marland Kitchen Dyslipidemia   . Generalized convulsive epilepsy without mention of intractable epilepsy   . Hypertension   . Memory deficit   . Migraine    "controlled on daily RX " (06/11/2016)  . Nephrolithiasis 07/30/2015   S/p lithotripsy x 3 and right and left ureter stents  . OSA (obstructive sleep apnea)    hx; was on CPAP; haven't needed it since OHS" (06/11/2016)  . PONV (postoperative nausea and vomiting)   . Seizures ([place])    "controlled w/daily RX; started in 2012; dr thinks they might be from the migraines; last sz was early part of 2016" (06/11/2016)  . Type II diabetes mellitus ([place])    diet controlled (06/11/2016)  . Vasovagal syncope 07/30/2015   2011    Patient Active Problem List   Diagnosis Date Noted  . Edema 01/25/2017  . Tobacco abuse 06/10/2016  . Headache 09/15/2015  . OSA (obstructive sleep apnea) 07/30/2015  . Nephrolithiasis 07/30/2015  . Vasovagal syncope 07/30/2015  . Carotid artery stenosis, asymptomatic 07/30/2015  . Coronary atherosclerosis of native coronary artery 08/22/2013  . Essential hypertension 08/22/2013  . Diabetes mellitus without complication ([place])   . Dyslipidemia   . Seizures ([place])   . Generalized convulsive epilepsy ([place]) 03/22/2013  . Memory deficit 03/22/2013    Past Surgical History:  Procedure Laterality Date  . CARDIAC CATHETERIZATION  2014; 06/11/2016  . CARDIAC CATHETERIZATION N/A 06/11/2016   Procedure: Left Heart Cath and Cors/Grafts Angiography;  Surgeon: Sherren Mocha, MD;  Location: [facility];  Service: Cardiovascular;  Laterality: N/A;  . CESAREAN SECTION    . CORONARY ANGIOPLASTY    . CORONARY ARTERY BYPASS GRAFT  March, 2014   LIMA to LAD, left radial to LCx  . LITHOTRIPSY  X 3  . TOTAL ABDOMINAL HYSTERECTOMY    . TUBAL LIGATION    . URETERAL STENT PLACEMENT      OB History    No data available       Home Medications    Prior to Admission medications   Medication Sig Start Date End Date Taking? Authorizing Provider  acetaminophen (TYLENOL) 325  MG tablet Take 2 tablets (650 mg total) by mouth every 4 (four) hours as needed for headache or mild pain. 06/11/16  Yes Isaiah Serge, NP  ALPRAZolam (XANAX) 0.25 MG tablet TAKE 1 TABLET BY MOUTH 3 TIMES A DAY AS NEEDED FOR ANXIETY 01/18/17  Yes [provider]  aspirin 81 MG tablet Take 81 mg by mouth daily.   Yes [provider]  atorvastatin (LIPITOR) 80 MG tablet Take 1 tablet (80 mg total) by mouth daily. 02/07/17 02/02/18 Yes Turner, Eber Hong, MD  clopidogrel (PLAVIX) 75 MG tablet TAKE ONE TABLET BY MOUTH DAILY 02/01/17  Yes Turner, Traci R, MD  escitalopram (LEXAPRO) 10 MG tablet Take 10 mg  by mouth daily.   Yes [provider]  ezetimibe (ZETIA) 10 MG tablet TAKE 1 TABLET (10 MG TOTAL) BY MOUTH DAILY. 08/17/17  Yes Turner, Eber Hong, MD  fexofenadine (ALLEGRA) 60 MG tablet Take 1 tablet (60 mg total) by mouth 2 (two) times daily. 05/24/17  Yes Varney Biles, MD  furosemide (LASIX) 20 MG tablet Take 1 tablet (20 mg total) by mouth daily as needed. Patient taking differently: Take 20 mg by mouth daily as needed for fluid.  04/26/17 10/30/23 Yes Turner, Eber Hong, MD  isosorbide mononitrate (IMDUR) 30 MG 24 hr tablet TAKE 0.5 TABLETS (15 MG TOTAL) BY MOUTH DAILY. 08/22/17 08/17/18 Yes Turner, Eber Hong, MD  levETIRAcetam (KEPPRA) 500 MG tablet Take 1 tablet (500 mg total) by mouth 2 (two) times daily. 10/18/16  Yes Ward Givens, NP  lisinopril (PRINIVIL,ZESTRIL) 20 MG tablet Take 1 tablet (20 mg total) by mouth daily. 02/07/17  Yes Turner, Eber Hong, MD  metoprolol succinate (TOPROL-XL) 25 MG 24 hr tablet Take 1 tablet (25 mg total) by mouth daily. 08/09/16  Yes Turner, Eber Hong, MD  nitroGLYCERIN (NITROSTAT) 0.4 MG SL tablet Place 1 tablet (0.4 mg total) under the tongue every 5 (five) minutes x 3 doses as needed for chest pain. 06/11/16  Yes Isaiah Serge, NP  topiramate (TOPAMAX) 25 MG tablet Take 3 tablets (75 mg total) by mouth 2 (two) times daily. 10/18/16  Yes Ward Givens, NP  benzonatate (TESSALON) 100 MG capsule Take 1 capsule (100 mg total) by mouth every 8 (eight) hours. Patient not taking: Reported on 10/29/2017 05/24/17   Varney Biles, MD    Family History Family History  Problem Relation Age of Onset  . Cancer Father   . Diabetes Father   . Diabetes Brother   . Stroke Brother   . Diabetes Brother   . Heart disease Brother        CAD with CABG  . Heart disease Sister        CAD with MI  . Heart attack Sister     Social History Social History   Tobacco Use  . Smoking status: Current Some Day Smoker    Packs/day: 0.33    Years: 35.00    Pack years: 11.55     Types: Cigarettes    Last attempt to quit: 06/11/2016    Years since quitting: 1.3  . Smokeless tobacco: Former Systems developer    Quit date: 11/20/2012  Substance Use Topics  . Alcohol use: Yes    Comment: 06/11/2016 "might have a few drinks/month; usually wine"  . Drug use: No     Allergies   Depakote [divalproex sodium] and Penicillins   Review of Systems Review of Systems  Cardiovascular: Positive for chest pain.  All other systems reviewed and are  negative.    Physical Exam Updated Vital Signs BP 100/61 (BP Location: Left Arm)   Pulse (!) 59   Temp 97.7 F (36.5 C) (Oral)   Resp 14   SpO2 99%   Physical Exam  Constitutional: She is oriented to person, place, and time. She appears well-developed and well-nourished. No distress.  HENT:  Head: Normocephalic and atraumatic.  Right Ear: Hearing normal.  Left Ear: Hearing normal.  Nose: Nose normal.  Mouth/Throat: Oropharynx is clear and moist and mucous membranes are normal.  Eyes: Conjunctivae and EOM are normal. Pupils are equal, round, and reactive to light.  Neck: Normal range of motion. Neck supple.  Cardiovascular: Regular rhythm, S1 normal and S2 normal. Exam reveals no gallop and no friction rub.  No murmur heard. Pulmonary/Chest: Effort normal and breath sounds normal. No respiratory distress. She exhibits no tenderness.  Abdominal: Soft. Normal appearance and bowel sounds are normal. There is no hepatosplenomegaly. There is no tenderness. There is no rebound, no guarding, no tenderness at McBurney's point and negative Murphy's sign. No hernia.  Musculoskeletal: Normal range of motion.  Neurological: She is alert and oriented to person, place, and time. She has normal strength. No cranial nerve deficit or sensory deficit. Coordination normal. GCS eye subscore is 4. GCS verbal subscore is 5. GCS motor subscore is 6.  Skin: Skin is warm, dry and intact. No rash noted. No cyanosis.  Psychiatric: She has a normal mood  and affect. Her speech is normal and behavior is normal. Thought content normal.  Nursing note and vitals reviewed.    ED Treatments / Results  Labs (all labs ordered are listed, but only abnormal results are displayed) Labs Reviewed  BASIC METABOLIC PANEL - Abnormal; Notable for the following components:      Result Value   Glucose, Bld 147 (*)    All other components within normal limits  CBC  I-STAT TROPONIN, ED    EKG  EKG Interpretation  Date/Time:  Saturday October 29 2017 20:16:23 EST Ventricular Rate:  82 PR Interval:  120 QRS Duration: 96 QT Interval:  356 QTC Calculation: 415 R Axis:   76 Text Interpretation:  Normal sinus rhythm T wave abnormality, consider anterolateral ischemia Abnormal ECG No significant change since last tracing Confirmed by Orpah Greek [phone]) on 10/29/2017 11:06:18 PM       Radiology Dg Chest 2 View  Result Date: 10/29/2017 CLINICAL DATA:  Chest pain EXAM: CHEST - 2 VIEW COMPARISON:  05/24/2017 chest radiograph. FINDINGS: Intact sternotomy wires. Stable cardiomediastinal silhouette with top-normal heart size. No pneumothorax. No pleural effusion. Lungs appear clear, with no acute consolidative airspace disease and no pulmonary edema. IMPRESSION: No active cardiopulmonary disease. Electronically Signed   By: Ilona Sorrel M.D.   On: 10/29/2017 20:57    Procedures Procedures (including critical care time)  Medications Ordered in ED Medications  nitroGLYCERIN (NITROSTAT) SL tablet 0.4 mg (0.4 mg Sublingual Given 10/30/17 0019)  nitroGLYCERIN (NITROGLYN) 2 % ointment 1 inch (not administered)  aspirin chewable tablet 324 mg (324 mg Oral Given 10/30/17 0017)     Initial Impression / Assessment and Plan / ED Course  I have reviewed the triage vital signs and the nursing notes.  Pertinent labs & imaging results that were available during my care of the patient were reviewed by me and considered in my medical decision making (see chart  for details).     Patient presents to the emergency department for evaluation of chest pain.  She  has been having intermittent episodes of chest pain this week.  She has known coronary artery disease.  She is status post bypass.  Most recent cardiac catheterization did not reveal any evidence of recurrent blockages, however, she has been felt to have microvascular disease.  She initially had relief with nitroglycerin this week, but today did not have complete relief.  She came in with very mild discomfort that has been improved with nitroglycerin in the ER.  She has unchanged EKG and normal first troponin.  She will require hospitalization for further management.  Final Clinical Impressions(s) / ED Diagnoses   Final diagnoses:  Chest pain, unspecified type    ED Discharge Orders    None       Orpah Greek, MD 10/30/17 [phone]

## 2017-10-29 NOTE — ED Triage Notes (Signed)
The patient said she was having chest pain since Wednesday.  She took a Nitroglycerin Wednesday night and it got better so she went to sleep.  She then picked up a box on Friday and she felt the chest pain again.  She said the pain has never completely gone away.  She said she took another nitro this morning and decided to come to the ED since it is not going away.

## 2017-10-30 ENCOUNTER — Other Ambulatory Visit: Payer: Self-pay | Admitting: Adult Health

## 2017-10-30 ENCOUNTER — Other Ambulatory Visit: Payer: Self-pay | Admitting: Cardiology

## 2017-10-30 ENCOUNTER — Encounter (HOSPITAL_COMMUNITY): Payer: Self-pay | Admitting: General Practice

## 2017-10-30 DIAGNOSIS — G40309 Generalized idiopathic epilepsy and epileptic syndromes, not intractable, without status epilepticus: Secondary | ICD-10-CM

## 2017-10-30 DIAGNOSIS — R0789 Other chest pain: Secondary | ICD-10-CM

## 2017-10-30 DIAGNOSIS — I25119 Atherosclerotic heart disease of native coronary artery with unspecified angina pectoris: Secondary | ICD-10-CM | POA: Diagnosis not present

## 2017-10-30 DIAGNOSIS — I10 Essential (primary) hypertension: Secondary | ICD-10-CM | POA: Diagnosis not present

## 2017-10-30 DIAGNOSIS — R079 Chest pain, unspecified: Secondary | ICD-10-CM | POA: Diagnosis not present

## 2017-10-30 LAB — TROPONIN I
Troponin I: <0.03 ng/mL (ref <0.03)
Troponin I: <0.03 ng/mL (ref <0.03)
Troponin I: <0.03 ng/mL (ref <0.03)

## 2017-10-30 LAB — HIV ANTIBODY (ROUTINE TESTING W REFLEX): HIV SCREEN 4TH GENERATION: NONREACTIVE

## 2017-10-30 MED ORDER — ISOSORBIDE MONONITRATE ER 30 MG PO TB24: 30.0000 mg | ORAL_TABLET | Freq: Every day | ORAL | Status: DC

## 2017-10-30 MED ORDER — LEVETIRACETAM 500 MG PO TABS
500.0000 mg | ORAL_TABLET | Freq: Two times a day (BID) | ORAL | Status: DC
  Administered 2017-10-30: 500 mg via ORAL
  Filled 2017-10-30: qty 1

## 2017-10-30 MED ORDER — ISOSORBIDE MONONITRATE ER 30 MG PO TB24: 30.0000 mg | ORAL_TABLET | Freq: Every day | ORAL | 11 refills | Status: DC

## 2017-10-30 MED ORDER — ESCITALOPRAM OXALATE 10 MG PO TABS
10.0000 mg | ORAL_TABLET | Freq: Every day | ORAL | Status: DC
  Administered 2017-10-30: 10 mg via ORAL
  Filled 2017-10-30: qty 1

## 2017-10-30 MED ORDER — ENOXAPARIN SODIUM 40 MG/0.4ML ~~LOC~~ SOLN
40.0000 mg | SUBCUTANEOUS | Status: DC
  Filled 2017-10-30: qty 0.4

## 2017-10-30 MED ORDER — ACETAMINOPHEN 325 MG PO TABS
650.0000 mg | ORAL_TABLET | ORAL | Status: DC | PRN
Start: 2017-10-30 — End: 2017-10-30

## 2017-10-30 MED ORDER — ASPIRIN EC 81 MG PO TBEC
81.0000 mg | DELAYED_RELEASE_TABLET | Freq: Every day | ORAL | Status: DC
  Administered 2017-10-30: 81 mg via ORAL
  Filled 2017-10-30: qty 1

## 2017-10-30 MED ORDER — LISINOPRIL 20 MG PO TABS
20.0000 mg | ORAL_TABLET | Freq: Every day | ORAL | Status: DC
  Administered 2017-10-30: 20 mg via ORAL
  Filled 2017-10-30: qty 1

## 2017-10-30 MED ORDER — EZETIMIBE 10 MG PO TABS
10.0000 mg | ORAL_TABLET | Freq: Every day | ORAL | Status: DC
  Administered 2017-10-30: 10 mg via ORAL
  Filled 2017-10-30: qty 1

## 2017-10-30 MED ORDER — ACETAMINOPHEN 325 MG PO TABS: 650.0000 mg | ORAL_TABLET | ORAL | Status: DC | PRN

## 2017-10-30 MED ORDER — METOPROLOL SUCCINATE ER 25 MG PO TB24
25.0000 mg | ORAL_TABLET | Freq: Every day | ORAL | Status: DC
  Administered 2017-10-30: 25 mg via ORAL
  Filled 2017-10-30: qty 1

## 2017-10-30 MED ORDER — TOPIRAMATE 25 MG PO TABS
75.0000 mg | ORAL_TABLET | Freq: Two times a day (BID) | ORAL | Status: DC
  Administered 2017-10-30 (×2): 75 mg via ORAL
  Filled 2017-10-30 (×3): qty 3

## 2017-10-30 MED ORDER — ALPRAZOLAM 0.25 MG PO TABS: 0.2500 mg | ORAL_TABLET | Freq: Three times a day (TID) | ORAL | Status: DC | PRN

## 2017-10-30 MED ORDER — KETOROLAC TROMETHAMINE 30 MG/ML IJ SOLN
30.0000 mg | Freq: Once | INTRAMUSCULAR | Status: DC
  Filled 2017-10-30: qty 1

## 2017-10-30 MED ORDER — IBUPROFEN 400 MG PO TABS: 800.0000 mg | ORAL_TABLET | Freq: Two times a day (BID) | ORAL | 0 refills | Status: AC

## 2017-10-30 MED ORDER — CLOPIDOGREL BISULFATE 75 MG PO TABS
75.0000 mg | ORAL_TABLET | Freq: Every day | ORAL | Status: DC
  Administered 2017-10-30: 75 mg via ORAL
  Filled 2017-10-30: qty 1

## 2017-10-30 MED ORDER — ISOSORBIDE MONONITRATE ER 30 MG PO TB24
15.0000 mg | ORAL_TABLET | Freq: Every day | ORAL | Status: DC
  Administered 2017-10-30: 15 mg via ORAL
  Filled 2017-10-30: qty 1

## 2017-10-30 MED ORDER — ATORVASTATIN CALCIUM 80 MG PO TABS
80.0000 mg | ORAL_TABLET | Freq: Every day | ORAL | Status: DC
  Administered 2017-10-30: 80 mg via ORAL
  Filled 2017-10-30: qty 1

## 2017-10-30 MED ORDER — TOPIRAMATE 25 MG PO TABS: 75.0000 mg | ORAL_TABLET | Freq: Two times a day (BID) | ORAL | Status: DC

## 2017-10-30 MED ORDER — ONDANSETRON HCL 4 MG/2ML IJ SOLN: 4.0000 mg | Freq: Four times a day (QID) | INTRAMUSCULAR | Status: DC | PRN

## 2017-10-30 MED ORDER — NITROGLYCERIN 2 % TD OINT
1.0000 [in_us] | TOPICAL_OINTMENT | Freq: Four times a day (QID) | TRANSDERMAL | Status: DC
  Administered 2017-10-30: 1 [in_us] via TOPICAL
  Filled 2017-10-30: qty 30
  Filled 2017-10-30: qty 1

## 2017-10-30 NOTE — Discharge Summary (Signed)
Discharge Summary  Samantha Huffman BTD:[id number] DOB: [birth year]  PCP: Alroy Dust, L.Marlou Sa, MD  Admit date: 10/29/2017 Discharge date: 10/30/2017  Time spent: < 25 minutes  Admitted From: Home  Disposition: Home  Recommendations for Outpatient Follow-up:  1. Follow up with PCP in 1-2 weeks 2. Ibuprofen 400 mg twice daily times 5 days.  Imdur increased to 30 mg   Discharge Diagnoses:  Active Hospital Problems   Diagnosis Date Noted  . Chest pain, rule out acute myocardial infarction 10/30/2017  . Essential hypertension 08/22/2013  . Coronary atherosclerosis of native coronary artery 08/22/2013  . Generalized convulsive epilepsy ([place]) 03/22/2013    Resolved Hospital Problems  No resolved problems to display.    Discharge Condition: Stable  CODE STATUS: Full  Diet recommendation: Heart Healthy   Vitals:   10/30/17 0130 10/30/17 0333  BP: 111/82 (!) 144/65  Pulse: 68 67  Resp: 12 18  Temp:  97.7 F (36.5 C)  SpO2: 98% 100%    History of present illness:  Samantha Huffman is a 62 y.o. year old female with medical history significant for CAD status post CABG (2014), OSA, hyperlipidemia, type 2 diabetes who presented on 10/29/2017 with several days of intermittent chest pain and was found to have atypical chest pain  Hospital Course:  Principal Problem:   Chest pain, rule out acute myocardial infarction Active Problems:   Generalized convulsive epilepsy ([place])   Coronary atherosclerosis of native coronary artery   Essential hypertension   # Atypical Chest pain, suspect musculoskeletal in etiology -Intermittent heavy pressure on left side of chest over the past 1-2 weeks, associated with exertion and also at rest, improved with nitro, worse with palpation of left rib cage -EKG with chronic T wave inversions, troponin cycle negative x3 1 - TTE on 01/2017: Preserved EF, no WMAs LHC on 05/2016, patent grafts - High suspicion for MSK given reproducibility on exam, cardiology  agrees -Prior to discharge given one-time dose of Toradol -Discharge with ibuprofen 400 mg twice daily for 5 days (short-term okay per cardiology recommendations close - Home Imdur increased to 30 mg daily to help with chronic microvascular vessel disease, per cardiology recommendations  #Chronic medical conditions -No changes made to chronic home medicines  Antibiotics:None  Microbiology: None  Consultations:  Cardiology   Procedures/Studies:  Dg Chest 2 View  Result Date: 10/29/2017 CLINICAL DATA:  Chest pain EXAM: CHEST - 2 VIEW COMPARISON:  05/24/2017 chest radiograph. FINDINGS: Intact sternotomy wires. Stable cardiomediastinal silhouette with top-normal heart size. No pneumothorax. No pleural effusion. Lungs appear clear, with no acute consolidative airspace disease and no pulmonary edema. IMPRESSION: No active cardiopulmonary disease. Electronically Signed   By: Ilona Sorrel M.D.   On: 10/29/2017 20:57     Discharge Exam: BP (!) 144/65 (BP Location: Left Arm)   Pulse 67   Temp 97.7 F (36.5 C) (Oral)   Resp 18   Ht 5\' 1"  (1.549 m)   Wt 68.8 kg (151 lb 9.6 oz)   SpO2 100%   BMI 28.64 kg/m   General: Comfortably sitting in chair, no apparent distress Eyes: EOMI, anicteric ENT: Oral Mucosa clear and moist Cardiovascular: regular rate and rhythm, no murmurs, rubs or gallops, no edema, reproducible chest wall pain with palpation of left rib cage pain Respiratory: Normal respiratory effort on room air, lungs clear to auscultation bilaterally Abdomen: soft, non-distended, non-tender, normal bowel sounds Skin: No Rash Musculoskeletal:Good ROM, Normal muscle tone Neurologic: Grossly no focal neuro deficit.Mental status AAOx3 Psychiatric:Appropriate affect, and mood  Discharge Instructions You were cared for by a hospitalist during your hospital stay. If you have any questions about your discharge medications or the care you received while you were in the hospital after  you are discharged, you can call the unit and asked to speak with the hospitalist on call if the hospitalist that took care of you is not available. Once you are discharged, your primary care physician will handle any further medical issues. Please note that NO REFILLS for any discharge medications will be authorized once you are discharged, as it is imperative that you return to your primary care physician (or establish a relationship with a primary care physician if you do not have one) for your aftercare needs so that they can reassess your need for medications and monitor your lab values.  Discharge Instructions    Diet - low sodium heart healthy   Complete by:  As directed    Increase activity slowly   Complete by:  As directed      Allergies as of 10/30/2017      Reactions   Depakote [divalproex Sodium] Other (See Comments)   Hyperammonemia   Anesthesia S-i-60    Nausea per pt   Penicillins    Other reaction(s): GI intolerance Has patient had a PCN reaction causing immediate rash, facial/tongue/throat swelling, SOB or lightheadedness with hypotension: YES Has patient had a PCN reaction causing severe rash involving mucus membranes or skin necrosis: NO Has patient had a PCN reaction that required hospitalizationNO Has patient had a PCN reaction occurring within the last 10 years: NO If all of the above answers are "NO", then may proceed with Cephalosporin use.      Medication List    TAKE these medications   acetaminophen 325 MG tablet Commonly known as:  TYLENOL Take 2 tablets (650 mg total) by mouth every 4 (four) hours as needed for headache or mild pain.   ALPRAZolam 0.25 MG tablet Commonly known as:  XANAX TAKE 1 TABLET BY MOUTH 3 TIMES A DAY AS NEEDED FOR ANXIETY   aspirin 81 MG tablet Take 81 mg by mouth daily.   atorvastatin 80 MG tablet Commonly known as:  LIPITOR Take 1 tablet (80 mg total) by mouth daily.   clopidogrel 75 MG tablet Commonly known as:   PLAVIX TAKE ONE TABLET BY MOUTH DAILY   escitalopram 10 MG tablet Commonly known as:  LEXAPRO Take 10 mg by mouth daily.   ezetimibe 10 MG tablet Commonly known as:  ZETIA TAKE 1 TABLET (10 MG TOTAL) BY MOUTH DAILY.   fexofenadine 60 MG tablet Commonly known as:  ALLEGRA Take 1 tablet (60 mg total) by mouth 2 (two) times daily.   furosemide 20 MG tablet Commonly known as:  LASIX Take 1 tablet (20 mg total) by mouth daily as needed. What changed:  reasons to take this   ibuprofen 400 MG tablet Commonly known as:  ADVIL,MOTRIN Take 2 tablets (800 mg total) by mouth 2 (two) times daily for 5 days. Start taking on:  10/31/2017   isosorbide mononitrate 30 MG 24 hr tablet Commonly known as:  IMDUR Take 1 tablet (30 mg total) by mouth daily. What changed:  how much to take   levETIRAcetam 500 MG tablet Commonly known as:  KEPPRA Take 1 tablet (500 mg total) by mouth 2 (two) times daily.   lisinopril 20 MG tablet Commonly known as:  PRINIVIL,ZESTRIL Take 1 tablet (20 mg total) by mouth daily.   metoprolol succinate 25  MG 24 hr tablet Commonly known as:  TOPROL-XL Take 1 tablet (25 mg total) by mouth daily.   nitroGLYCERIN 0.4 MG SL tablet Commonly known as:  NITROSTAT Place 1 tablet (0.4 mg total) under the tongue every 5 (five) minutes x 3 doses as needed for chest pain.   topiramate 25 MG tablet Commonly known as:  TOPAMAX Take 3 tablets (75 mg total) by mouth 2 (two) times daily.      Allergies  Allergen Reactions  . Depakote [Divalproex Sodium] Other (See Comments)    Hyperammonemia  . Anesthesia S-I-60     Nausea per pt  . Penicillins     Other reaction(s): GI intolerance Has patient had a PCN reaction causing immediate rash, facial/tongue/throat swelling, SOB or lightheadedness with hypotension: YES Has patient had a PCN reaction causing severe rash involving mucus membranes or skin necrosis: NO Has patient had a PCN reaction that required  hospitalizationNO Has patient had a PCN reaction occurring within the last 10 years: NO If all of the above answers are "NO", then may proceed with Cephalosporin use.      The results of significant diagnostics from this hospitalization (including imaging, microbiology, ancillary and laboratory) are listed below for reference.    Significant Diagnostic Studies: Dg Chest 2 View  Result Date: 10/29/2017 CLINICAL DATA:  Chest pain EXAM: CHEST - 2 VIEW COMPARISON:  05/24/2017 chest radiograph. FINDINGS: Intact sternotomy wires. Stable cardiomediastinal silhouette with top-normal heart size. No pneumothorax. No pleural effusion. Lungs appear clear, with no acute consolidative airspace disease and no pulmonary edema. IMPRESSION: No active cardiopulmonary disease. Electronically Signed   By: Ilona Sorrel M.D.   On: 10/29/2017 20:57    Microbiology: No results found for this or any previous visit (from the past 240 hour(s)).   Labs: Basic Metabolic Panel: Recent Labs  Lab 10/29/17 2027  NA 139  K 3.8  CL 108  CO2 22  GLUCOSE 147*  BUN 9  CREATININE 0.84  CALCIUM 9.6   Liver Function Tests: No results for input(s): AST, ALT, ALKPHOS, BILITOT, PROT, ALBUMIN in the last 168 hours. No results for input(s): LIPASE, AMYLASE in the last 168 hours. No results for input(s): AMMONIA in the last 168 hours. CBC: Recent Labs  Lab 10/29/17 2027  WBC 6.0  HGB 12.7  HCT 38.5  MCV 94.8  PLT 192   Cardiac Enzymes: Recent Labs  Lab 10/30/17 0146 10/30/17 0444 10/30/17 0751  TROPONINI <0.03 <0.03 <0.03   BNP: BNP (last 3 results) No results for input(s): BNP in the last 8760 hours.  ProBNP (last 3 results) No results for input(s): PROBNP in the last 8760 hours.  CBG: No results for input(s): GLUCAP in the last 168 hours.     Signed:  Desiree Hane, MD [facility] 10/30/2017, 11:54 AM

## 2017-10-30 NOTE — Care Management Note (Addendum)
Case Management Note  Patient Details  Name: Samantha Huffman MRN: [id number] Date of Birth: 12/31/1955  Subjective/Objective: 62 yo F admitted with atypical chest pain, suspect musculoskeletal in etiology.     Action/Plan: Received referral to assist with medications   Expected Discharge Date:  10/30/17               Expected Discharge Plan:  Home/Self Care  In-House Referral:     Discharge planning Services  CM Consult  Post Acute Care Choice:    Choice offered to:     DME Arranged:    DME Agency:     HH Arranged:    HH Agency:     Status of Service:  Completed, signed off  If discussed at [employer] of Stay Meetings, dates discussed:    Additional Comments: Met with pt and daughter. She plans to return home with the support of her family. She stated that she has medical insurance and she has prescription coverage, but some of her medications are expensive. Informed pt that we are not able to assist her with her prescriptions through the [facility] program since she has prescription coverage. Provided pt with saving cards for prescriptions. She denies any other D/C needs.  Norina Buzzard, RN 10/30/2017, 1:26 PM

## 2017-10-30 NOTE — ED Notes (Signed)
Report to [facility], RN 6E

## 2017-10-30 NOTE — Consult Note (Addendum)
Cardiology Consult    Patient ID: Samantha Huffman MRN: [id number], DOB/AGE: 1956/04/21   Admit date: 10/29/2017 Date of Consult: 10/30/2017  Primary Physician: Alroy Dust, L.Marlou Sa, MD Primary Cardiologist: Dr. Radford Pax Requesting Provider: Dr. Alcario Drought Reason for Consultation: Chest pain  Samantha Huffman is a 62 y.o. female who is being seen today for the evaluation of chest pain at the request of Dr. Alcario Drought.   Patient Profile    62 yo female with PMH of CABG s/p CABG 2v, HTN, HL, DMII, OSA and carotid artery disease who presented with chest pain.   Past Medical History   Past Medical History:  Diagnosis Date  . Carotid artery stenosis    a. Last carotid 07/2015 - 1-39% stenosis .  Marland Kitchen Coronary artery disease    a. s/p CABG 10/2012.  cath 05/2016 with moderate LM stenosis, mild LCx and RCA stenosis and occluded LAD with patent LIMA to LAD, free radial to OM.  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  Marland Kitchen Dyslipidemia   . Generalized convulsive epilepsy without mention of intractable epilepsy   . Hypertension   . Memory deficit   . Migraine    "controlled on daily RX " (06/11/2016)  . Nephrolithiasis 07/30/2015   S/p lithotripsy x 3 and right and left ureter stents  . OSA (obstructive sleep apnea)    hx; was on CPAP; haven't needed it since OHS" (06/11/2016)  . PONV (postoperative nausea and vomiting)   . Seizures ([place])    "controlled w/daily RX; started in 2012; dr thinks they might be from the migraines; last sz was early part of 2016" (06/11/2016)  . Type II diabetes mellitus ([place])    diet controlled (06/11/2016)  . Vasovagal syncope 07/30/2015   2011    Past Surgical History:  Procedure Laterality Date  . CARDIAC CATHETERIZATION  2014; 06/11/2016  . CARDIAC CATHETERIZATION N/A 06/11/2016   Procedure: Left Heart Cath and Cors/Grafts Angiography;  Surgeon: Sherren Mocha, MD;  Location: [facility];  Service: Cardiovascular;  Laterality: N/A;  . CESAREAN SECTION    .  CORONARY ANGIOPLASTY    . CORONARY ARTERY BYPASS GRAFT  March, 2014   LIMA to LAD, left radial to LCx  . LITHOTRIPSY  X 3  . TOTAL ABDOMINAL HYSTERECTOMY    . TUBAL LIGATION    . URETERAL STENT PLACEMENT       Allergies  Allergies  Allergen Reactions  . Depakote [Divalproex Sodium] Other (See Comments)    Hyperammonemia  . Anesthesia S-I-60     Nausea per pt  . Penicillins     Other reaction(s): GI intolerance Has patient had a PCN reaction causing immediate rash, facial/tongue/throat swelling, SOB or lightheadedness with hypotension: YES Has patient had a PCN reaction causing severe rash involving mucus membranes or skin necrosis: NO Has patient had a PCN reaction that required hospitalizationNO Has patient had a PCN reaction occurring within the last 10 years: NO If all of the above answers are "NO", then may proceed with Cephalosporin use.    History of Present Illness    Samantha Huffman is a 62 yo female with PMH of CABG s/p CABG 2v, HTN, HL, DMII, OSA and carotid artery disease. She is followed by Dr. Radford Pax as an outpatient. Last had a stress test back in 10/17 that showed ischemia with follow up cath noting patent LIMA-LAD and free radial to OM with 30% in the RCA. It was felt she likely had microvascular disease. Imdur was added. Last echo  from 6/18 showed normal EF with no WMA noted.   She was last seen in the office on 9/18 and reported doing well. Did report occasional episodes of chest pain mostly when she gets emotional but no exertional symptoms. Compliant with her home medications and was continued on ASA, Plavix, Imdur, Statin and Toprol.   Reports she has been feeling well until this past week. Developed chest heaviness while at rest on Wednesday night and took a SL nitro with improvement. Then was out with her daughter yesterday in [place] and developed the chest heaviness again. Took another SL nitro with some improvement. Told her daughter about her symptoms and asked  that she be brought to the ED.   In the ED her labs showed stable electrolytes, Trop neg x3, Hgb 12.7. EKG showed SR with TWI in inferolateral leads noted on previous tracings. Nitro paste was placed and she felt better. CXR was negative.   Reported the pressure was 5/10 on admission, and feels better be still reporting pressure as a 5/10/ She is very tender to palpitation across the anterior chest wall.   Inpatient Medications    . aspirin EC  81 mg Oral Daily  . atorvastatin  80 mg Oral Daily  . clopidogrel  75 mg Oral Daily  . enoxaparin (LOVENOX) injection  40 mg Subcutaneous Q24H  . escitalopram  10 mg Oral Daily  . ezetimibe  10 mg Oral Daily  . isosorbide mononitrate  15 mg Oral Daily  . levETIRAcetam  500 mg Oral BID  . lisinopril  20 mg Oral Daily  . metoprolol succinate  25 mg Oral Daily  . nitroGLYCERIN  1 inch Topical Q6H  . topiramate  75 mg Oral BID    Family History    Family History  Problem Relation Age of Onset  . Cancer Father   . Diabetes Father   . Diabetes Brother   . Stroke Brother   . Diabetes Brother   . Heart disease Brother        CAD with CABG  . Heart disease Sister        CAD with MI  . Heart attack Sister     Social History    Social History   Socioeconomic History  . Marital status: Divorced    Spouse name: Not on file  . Number of children: 2  . Years of education: 57  . Highest education level: Not on file  Social Needs  . Financial resource strain: Not on file  . Food insecurity - worry: Not on file  . Food insecurity - inability: Not on file  . Transportation needs - medical: Not on file  . Transportation needs - non-medical: Not on file  Occupational History  . Occupation: retired  Tobacco Use  . Smoking status: Current Some Day Smoker    Packs/day: 0.33    Years: 35.00    Pack years: 11.55    Types: Cigarettes    Last attempt to quit: 06/11/2016    Years since quitting: 1.3  . Smokeless tobacco: Former Systems developer    Quit  date: 11/20/2012  Substance and Sexual Activity  . Alcohol use: Yes    Comment: 06/11/2016 "might have a few drinks/month; usually wine"  . Drug use: No  . Sexual activity: Not Currently  Other Topics Concern  . Not on file  Social History Narrative   Patient drinks about 1 cup of coffee daily.   Patient is right handed.  Review of Systems    See HPI  All other systems reviewed and are otherwise negative except as noted above.  Physical Exam    Blood pressure (!) 144/65, pulse 67, temperature 97.7 F (36.5 C), temperature source Oral, resp. rate 18, height 5\' 1"  (1.549 m), weight 151 lb 9.6 oz (68.8 kg), SpO2 100 %.  General: Pleasant, NAD Psych: Normal affect. Neuro: Alert and oriented X 3. Moves all extremities spontaneously. HEENT: Normal  Neck: Supple without bruits or JVD. Lungs:  Resp regular and unlabored, CTA. Heart: RRR no s3, s4, or murmurs. Abdomen: Soft, non-tender, non-distended, BS + x 4.  Extremities: No clubbing, cyanosis or edema. DP/PT/Radials 2+ and equal bilaterally.  Labs    Troponin [facility] of Care Test) Recent Labs    10/29/17 2049  TROPIPOC 0.00   Recent Labs    10/30/17 0146 10/30/17 0444  TROPONINI <0.03 <0.03   Lab Results  Component Value Date   WBC 6.0 10/29/2017   HGB 12.7 10/29/2017   HCT 38.5 10/29/2017   MCV 94.8 10/29/2017   PLT 192 10/29/2017    Recent Labs  Lab 10/29/17 2027  NA 139  K 3.8  CL 108  CO2 22  BUN 9  CREATININE 0.84  CALCIUM 9.6  GLUCOSE 147*   Lab Results  Component Value Date   CHOL 216 (H) 06/11/2016   HDL 48 06/11/2016   LDLCALC 151 (H) 06/11/2016   TRIG 86 06/11/2016   No results found for: [facility]   Radiology Studies    Dg Chest 2 View  Result Date: 10/29/2017 CLINICAL DATA:  Chest pain EXAM: CHEST - 2 VIEW COMPARISON:  05/24/2017 chest radiograph. FINDINGS: Intact sternotomy wires. Stable cardiomediastinal silhouette with top-normal heart size. No pneumothorax. No pleural effusion.  Lungs appear clear, with no acute consolidative airspace disease and no pulmonary edema. IMPRESSION: No active cardiopulmonary disease. Electronically Signed   By: Ilona Sorrel M.D.   On: 10/29/2017 20:57    ECG & Cardiac Imaging    EKG:  The EKG was personally reviewed and demonstrates SR with TWI in inferolateral leads (noted on previous tracing 10/18)  Echo: 6/18  Study Conclusions  - Left ventricle: The cavity size was normal. There was mild focal   basal hypertrophy of the septum. Systolic function was normal.   The estimated ejection fraction was in the range of 60% to 65%.   Wall motion was normal; there were no regional wall motion   abnormalities. Left ventricular diastolic function parameters   were normal. - Aortic valve: Trileaflet; mildly thickened, mildly calcified   leaflets. - Mitral valve: There was mild regurgitation.  Assessment & Plan    62 yo female with PMH of CABG s/p CABG 2v, HTN, HL, DMII, OSA and carotid artery disease who presented with chest pain.   1. Chest pressure: Has had several episodes over the past week and used nitro with improvement. Had nitro paste placed in the ED and reports improvement but still rates her pain the same. Last cath with patent grafts. Last echo normal EF. She has ruled out and is tender to anterior chest wall with palpation. Also tells me she has jury this week and feels that she may not feel well enough to make it. Has been on Imdur 15mg  daily outpatient for suspect microvascular disease. Can consider increasing this. Anticipate no plans for further work up this admission.   2. HTN: stable  3. HL: on high dose statin  4. DMII: reports this  is diet controlled  Signed, Reino Bellis, NP-C Pager [phone] 10/30/2017, 7:43 AM  Attending note Patient seen and discussed with NP Mancel Bale, I agree with her documentation above. 62 yo female hx of CAD with prior CABG in 2014, HL, HTN, DM. Last cath 05/2016 with patent grafts,  thought she may have microvacsular angina and started on imdur. Prior LE edema on norvasc. Notes also mention her chest pain in the past has improved with anxiety meds.   Presents with left sided chest pain. Describes as pressure, varying from 1-5/10. Can have some SOB. Worst with movement and breathing, worst with palpation. Similar to 2017 pain when she had cath with patent grafts. At times better with NG. Can occur at rest or with exertion.   K 3.8 Cr 0.84 WBC 6 Hgb 12.7 Plt 192  Trop neg x 4 EKG SR, chronic diffuse TWIs Echo 01/2017 LVEF 60-65%, no WMAs 05/2016 cath: LIMA-LAD patent, radial to OM patent. Mild LCX and RCA stenosis.   Patient presents with overall atypical chest pain. Worst with position and breathing, left chest wall tend to palpation. No objective evidence of ischemia by EKG or enzymes. Cath 2017 with patent grafts, concerns for possible microvascular ischemia. No plans for repeat ischemic testing at at this time, increase imdur to 30mg  daily. LIkely MSK component, one time dose of toradol 30mg  (one time dose reasonable in CAD patient), and short course of NSAIDs at home ibupforen 400mg  bid x 5 days (short term course reasonable in CAD). No further cardiac workup at this time, we will arrange outpatient f/u. Reasonable for discharge later today.    Carlyle Dolly MD

## 2017-10-30 NOTE — Discharge Instructions (Signed)

## 2017-10-30 NOTE — Care Management Obs Status (Signed)
[facility] NOTIFICATION   Patient Details  Name: Samantha Huffman MRN: [id number] Date of Birth: 29-Feb-1956   Medicare Observation Status Notification Given:  Yes    Norina Buzzard, RN 10/30/2017, 1:11 PM

## 2017-10-30 NOTE — H&P (Signed)
History and Physical    Samantha Huffman UYQ:[id number] DOB: 31-Oct-1955 DOA: 10/29/2017  PCP: Alroy Dust, L.Marlou Sa, MD  Patient coming from: Home  I have personally briefly reviewed patient's old medical records in [place]  Chief Complaint: Chest pain  HPI: Samantha Huffman is a 62 y.o. female with medical history significant of CAD s/p CABG in 2014.  HTN.  Last cath in 2017.  Patient presents to the ED with c/o intermittent CP over the last several days.  Heaviness and pressure in chest.  Resolved with SL NTG at home.  Reoccurred this AM, took SL NTG again, improved but not entirely resolved so came to ER tonight for eval.   ED Course: Pain resolved after NTG SL / paste.  Trop neg.   Review of Systems: As per HPI otherwise 10 point review of systems negative.   Past Medical History:  Diagnosis Date  . Carotid artery stenosis    a. Last carotid 07/2015 - 1-39% stenosis .  Marland Kitchen Coronary artery disease    a. s/p CABG 10/2012.  cath 05/2016 with moderate LM stenosis, mild LCx and RCA stenosis and occluded LAD with patent LIMA to LAD, free radial to OM.  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  Marland Kitchen Dyslipidemia   . Generalized convulsive epilepsy without mention of intractable epilepsy   . Hypertension   . Memory deficit   . Migraine    "controlled on daily RX " (06/11/2016)  . Nephrolithiasis 07/30/2015   S/p lithotripsy x 3 and right and left ureter stents  . OSA (obstructive sleep apnea)    hx; was on CPAP; haven't needed it since OHS" (06/11/2016)  . PONV (postoperative nausea and vomiting)   . Seizures ([place])    "controlled w/daily RX; started in 2012; dr thinks they might be from the migraines; last sz was early part of 2016" (06/11/2016)  . Type II diabetes mellitus ([place])    diet controlled (06/11/2016)  . Vasovagal syncope 07/30/2015   2011    Past Surgical History:  Procedure Laterality Date  . CARDIAC CATHETERIZATION  2014; 06/11/2016  . CARDIAC CATHETERIZATION N/A  06/11/2016   Procedure: Left Heart Cath and Cors/Grafts Angiography;  Surgeon: Sherren Mocha, MD;  Location: [facility];  Service: Cardiovascular;  Laterality: N/A;  . CESAREAN SECTION    . CORONARY ANGIOPLASTY    . CORONARY ARTERY BYPASS GRAFT  March, 2014   LIMA to LAD, left radial to LCx  . LITHOTRIPSY  X 3  . TOTAL ABDOMINAL HYSTERECTOMY    . TUBAL LIGATION    . URETERAL STENT PLACEMENT       reports that she has been smoking cigarettes.  She has a 11.55 pack-year smoking history. She quit smokeless tobacco use about 4 years ago. She reports that she drinks alcohol. She reports that she does not use drugs.  Allergies  Allergen Reactions  . Depakote [Divalproex Sodium] Other (See Comments)    Hyperammonemia  . Penicillins     Other reaction(s): GI intolerance Has patient had a PCN reaction causing immediate rash, facial/tongue/throat swelling, SOB or lightheadedness with hypotension: YES Has patient had a PCN reaction causing severe rash involving mucus membranes or skin necrosis: NO Has patient had a PCN reaction that required hospitalizationNO Has patient had a PCN reaction occurring within the last 10 years: NO If all of the above answers are "NO", then may proceed with Cephalosporin use.    Family History  Problem Relation Age of Onset  .  Cancer Father   . Diabetes Father   . Diabetes Brother   . Stroke Brother   . Diabetes Brother   . Heart disease Brother        CAD with CABG  . Heart disease Sister        CAD with MI  . Heart attack Sister      Prior to Admission medications   Medication Sig Start Date End Date Taking? Authorizing Provider  acetaminophen (TYLENOL) 325 MG tablet Take 2 tablets (650 mg total) by mouth every 4 (four) hours as needed for headache or mild pain. 06/11/16  Yes Isaiah Serge, NP  ALPRAZolam (XANAX) 0.25 MG tablet TAKE 1 TABLET BY MOUTH 3 TIMES A DAY AS NEEDED FOR ANXIETY 01/18/17  Yes [provider]  aspirin 81 MG  tablet Take 81 mg by mouth daily.   Yes [provider]  atorvastatin (LIPITOR) 80 MG tablet Take 1 tablet (80 mg total) by mouth daily. 02/07/17 02/02/18 Yes Turner, Eber Hong, MD  clopidogrel (PLAVIX) 75 MG tablet TAKE ONE TABLET BY MOUTH DAILY 02/01/17  Yes Turner, Traci R, MD  escitalopram (LEXAPRO) 10 MG tablet Take 10 mg by mouth daily.   Yes [provider]  ezetimibe (ZETIA) 10 MG tablet TAKE 1 TABLET (10 MG TOTAL) BY MOUTH DAILY. 08/17/17  Yes Turner, Eber Hong, MD  fexofenadine (ALLEGRA) 60 MG tablet Take 1 tablet (60 mg total) by mouth 2 (two) times daily. 05/24/17  Yes Varney Biles, MD  furosemide (LASIX) 20 MG tablet Take 1 tablet (20 mg total) by mouth daily as needed. Patient taking differently: Take 20 mg by mouth daily as needed for fluid.  04/26/17 10/30/23 Yes Turner, Eber Hong, MD  isosorbide mononitrate (IMDUR) 30 MG 24 hr tablet TAKE 0.5 TABLETS (15 MG TOTAL) BY MOUTH DAILY. 08/22/17 08/17/18 Yes Turner, Eber Hong, MD  levETIRAcetam (KEPPRA) 500 MG tablet Take 1 tablet (500 mg total) by mouth 2 (two) times daily. 10/18/16  Yes Ward Givens, NP  lisinopril (PRINIVIL,ZESTRIL) 20 MG tablet Take 1 tablet (20 mg total) by mouth daily. 02/07/17  Yes Turner, Eber Hong, MD  metoprolol succinate (TOPROL-XL) 25 MG 24 hr tablet Take 1 tablet (25 mg total) by mouth daily. 08/09/16  Yes Turner, Eber Hong, MD  nitroGLYCERIN (NITROSTAT) 0.4 MG SL tablet Place 1 tablet (0.4 mg total) under the tongue every 5 (five) minutes x 3 doses as needed for chest pain. 06/11/16  Yes Isaiah Serge, NP  topiramate (TOPAMAX) 25 MG tablet Take 3 tablets (75 mg total) by mouth 2 (two) times daily. 10/18/16  Yes Ward Givens, NP    Physical Exam: Vitals:   10/29/17 2021 10/30/17 0010 10/30/17 0100 10/30/17 0130  BP: (!) 143/62 100/61 122/81 111/82  Pulse: 77 (!) 59 60 68  Resp: 20 14 18 12   Temp: 98.7 F (37.1 C) 97.7 F (36.5 C)    TempSrc: Oral Oral    SpO2: 98% 99% 98% 98%     Constitutional: NAD, calm, comfortable Eyes: PERRL, lids and conjunctivae normal ENMT: Mucous membranes are moist. Posterior pharynx clear of any exudate or lesions.Normal dentition.  Neck: normal, supple, no masses, no thyromegaly Respiratory: clear to auscultation bilaterally, no wheezing, no crackles. Normal respiratory effort. No accessory muscle use.  Cardiovascular: Regular rate and rhythm, no murmurs / rubs / gallops. No extremity edema. 2+ pedal pulses. No carotid bruits.  Abdomen: no tenderness, no masses palpated. No hepatosplenomegaly. Bowel sounds positive.  Musculoskeletal: no clubbing /  cyanosis. No joint deformity upper and lower extremities. Good ROM, no contractures. Normal muscle tone.  Skin: no rashes, lesions, ulcers. No induration Neurologic: CN 2-12 grossly intact. Sensation intact, DTR normal. Strength 5/5 in all 4.  Psychiatric: Normal judgment and insight. Alert and oriented x 3. Normal mood.    Labs on Admission: I have personally reviewed following labs and imaging studies  CBC: Recent Labs  Lab 10/29/17 2027  WBC 6.0  HGB 12.7  HCT 38.5  MCV 94.8  PLT 347   Basic Metabolic Panel: Recent Labs  Lab 10/29/17 2027  NA 139  K 3.8  CL 108  CO2 22  GLUCOSE 147*  BUN 9  CREATININE 0.84  CALCIUM 9.6   GFR: CrCl cannot be calculated (Unknown ideal weight.). Liver Function Tests: No results for input(s): AST, ALT, ALKPHOS, BILITOT, PROT, ALBUMIN in the last 168 hours. No results for input(s): LIPASE, AMYLASE in the last 168 hours. No results for input(s): AMMONIA in the last 168 hours. Coagulation Profile: No results for input(s): INR, PROTIME in the last 168 hours. Cardiac Enzymes: No results for input(s): CKTOTAL, CKMB, CKMBINDEX, TROPONINI in the last 168 hours. BNP (last 3 results) No results for input(s): PROBNP in the last 8760 hours. HbA1C: No results for input(s): HGBA1C in the last 72 hours. CBG: No results for input(s): GLUCAP in  the last 168 hours. Lipid Profile: No results for input(s): CHOL, HDL, LDLCALC, TRIG, CHOLHDL, LDLDIRECT in the last 72 hours. Thyroid Function Tests: No results for input(s): TSH, T4TOTAL, FREET4, T3FREE, THYROIDAB in the last 72 hours. Anemia Panel: No results for input(s): VITAMINB12, FOLATE, FERRITIN, TIBC, IRON, RETICCTPCT in the last 72 hours. Urine analysis: No results found for: COLORURINE, APPEARANCEUR, [place], [place], GLUCOSEU, [place], [place], [place], PROTEINUR, [place], NITRITE, LEUKOCYTESUR  Radiological Exams on Admission: Dg Chest 2 View  Result Date: 10/29/2017 CLINICAL DATA:  Chest pain EXAM: CHEST - 2 VIEW COMPARISON:  05/24/2017 chest radiograph. FINDINGS: Intact sternotomy wires. Stable cardiomediastinal silhouette with top-normal heart size. No pneumothorax. No pleural effusion. Lungs appear clear, with no acute consolidative airspace disease and no pulmonary edema. IMPRESSION: No active cardiopulmonary disease. Electronically Signed   By: Ilona Sorrel M.D.   On: 10/29/2017 20:57    EKG: Independently reviewed.  Assessment/Plan Principal Problem:   Chest pain, rule out acute myocardial infarction Active Problems:   Seizures (HCC)   Coronary atherosclerosis of native coronary artery   Essential hypertension    1. CP r/o - 1. CP obs pathway 2. Serial trops 3. NTG paste 4. Tele monitor 5. Cards eval in AM 6. Will keep NPO just in case 2. CAD - 1. Continue ASA, statin 2. Continue imdur 3. HTN - continue home BP meds 4. Seizure disorder - continue home meds  DVT prophylaxis: Lovenox Code Status: Full Family Communication: No family in room Disposition Plan: Home after admit Consults called: Left message for P.Trent for routine cards eval in AM Admission status: Place in [place], [place] [facility] Pager [phone]  If 7AM-7PM, please contact day team taking care of patient [email or web address] Password [email or web address]  10/30/2017,  1:48 AM

## 2017-11-08 ENCOUNTER — Encounter (HOSPITAL_COMMUNITY): Payer: Self-pay | Admitting: Emergency Medicine

## 2017-11-08 ENCOUNTER — Emergency Department (HOSPITAL_COMMUNITY): Payer: Medicare HMO

## 2017-11-08 DIAGNOSIS — R69 Illness, unspecified: Secondary | ICD-10-CM | POA: Diagnosis not present

## 2017-11-08 DIAGNOSIS — I2510 Atherosclerotic heart disease of native coronary artery without angina pectoris: Secondary | ICD-10-CM | POA: Diagnosis not present

## 2017-11-08 DIAGNOSIS — F17210 Nicotine dependence, cigarettes, uncomplicated: Secondary | ICD-10-CM | POA: Diagnosis not present

## 2017-11-08 DIAGNOSIS — R0789 Other chest pain: Secondary | ICD-10-CM | POA: Diagnosis not present

## 2017-11-08 DIAGNOSIS — I10 Essential (primary) hypertension: Secondary | ICD-10-CM | POA: Diagnosis not present

## 2017-11-08 DIAGNOSIS — Z7902 Long term (current) use of antithrombotics/antiplatelets: Secondary | ICD-10-CM | POA: Insufficient documentation

## 2017-11-08 DIAGNOSIS — Z79899 Other long term (current) drug therapy: Secondary | ICD-10-CM | POA: Insufficient documentation

## 2017-11-08 DIAGNOSIS — Z951 Presence of aortocoronary bypass graft: Secondary | ICD-10-CM | POA: Insufficient documentation

## 2017-11-08 DIAGNOSIS — R079 Chest pain, unspecified: Secondary | ICD-10-CM | POA: Diagnosis not present

## 2017-11-08 DIAGNOSIS — E119 Type 2 diabetes mellitus without complications: Secondary | ICD-10-CM | POA: Diagnosis not present

## 2017-11-08 DIAGNOSIS — Z7982 Long term (current) use of aspirin: Secondary | ICD-10-CM | POA: Diagnosis not present

## 2017-11-08 LAB — BASIC METABOLIC PANEL
ANION GAP: 8 (ref 5–15)
BUN: 10 mg/dL (ref 6–20)
CALCIUM: 9.7 mg/dL (ref 8.9–10.3)
CO2: 21 mmol/L — ABNORMAL LOW (ref 22–32)
Chloride: 110 mmol/L (ref 101–111)
Creatinine, Ser: 0.98 mg/dL (ref 0.44–1.00)
Glucose, Bld: 153 mg/dL — ABNORMAL HIGH (ref 65–99)
POTASSIUM: 3.8 mmol/L (ref 3.5–5.1)
Sodium: 139 mmol/L (ref 135–145)

## 2017-11-08 LAB — TROPONIN I

## 2017-11-08 LAB — RAPID URINE DRUG SCREEN, HOSP PERFORMED
Amphetamines: NOT DETECTED
BARBITURATES: NOT DETECTED
Benzodiazepines: NOT DETECTED
COCAINE: NOT DETECTED
Opiates: NOT DETECTED
TETRAHYDROCANNABINOL: NOT DETECTED

## 2017-11-08 LAB — CBC
HEMATOCRIT: 35.9 % — AB (ref 36.0–46.0)
HEMOGLOBIN: 11.9 g/dL — AB (ref 12.0–15.0)
MCH: 31.1 pg (ref 26.0–34.0)
MCHC: 33.1 g/dL (ref 30.0–36.0)
MCV: 93.7 fL (ref 78.0–100.0)
Platelets: 190 10*3/uL (ref 150–400)
RBC: 3.83 MIL/uL — ABNORMAL LOW (ref 3.87–5.11)
RDW: 12.8 % (ref 11.5–15.5)
WBC: 5.8 10*3/uL (ref 4.0–10.5)

## 2017-11-08 LAB — ETHANOL: Alcohol, Ethyl (B): <10 mg/dL (ref <10)

## 2017-11-08 IMAGING — DX DG CHEST 2V
2 series · 2 of 2 positions shown · non-contrast
Comparison: [DATE]

CLINICAL DATA: Chest pain

EXAM:
CHEST - 2 VIEW

[chest pa]
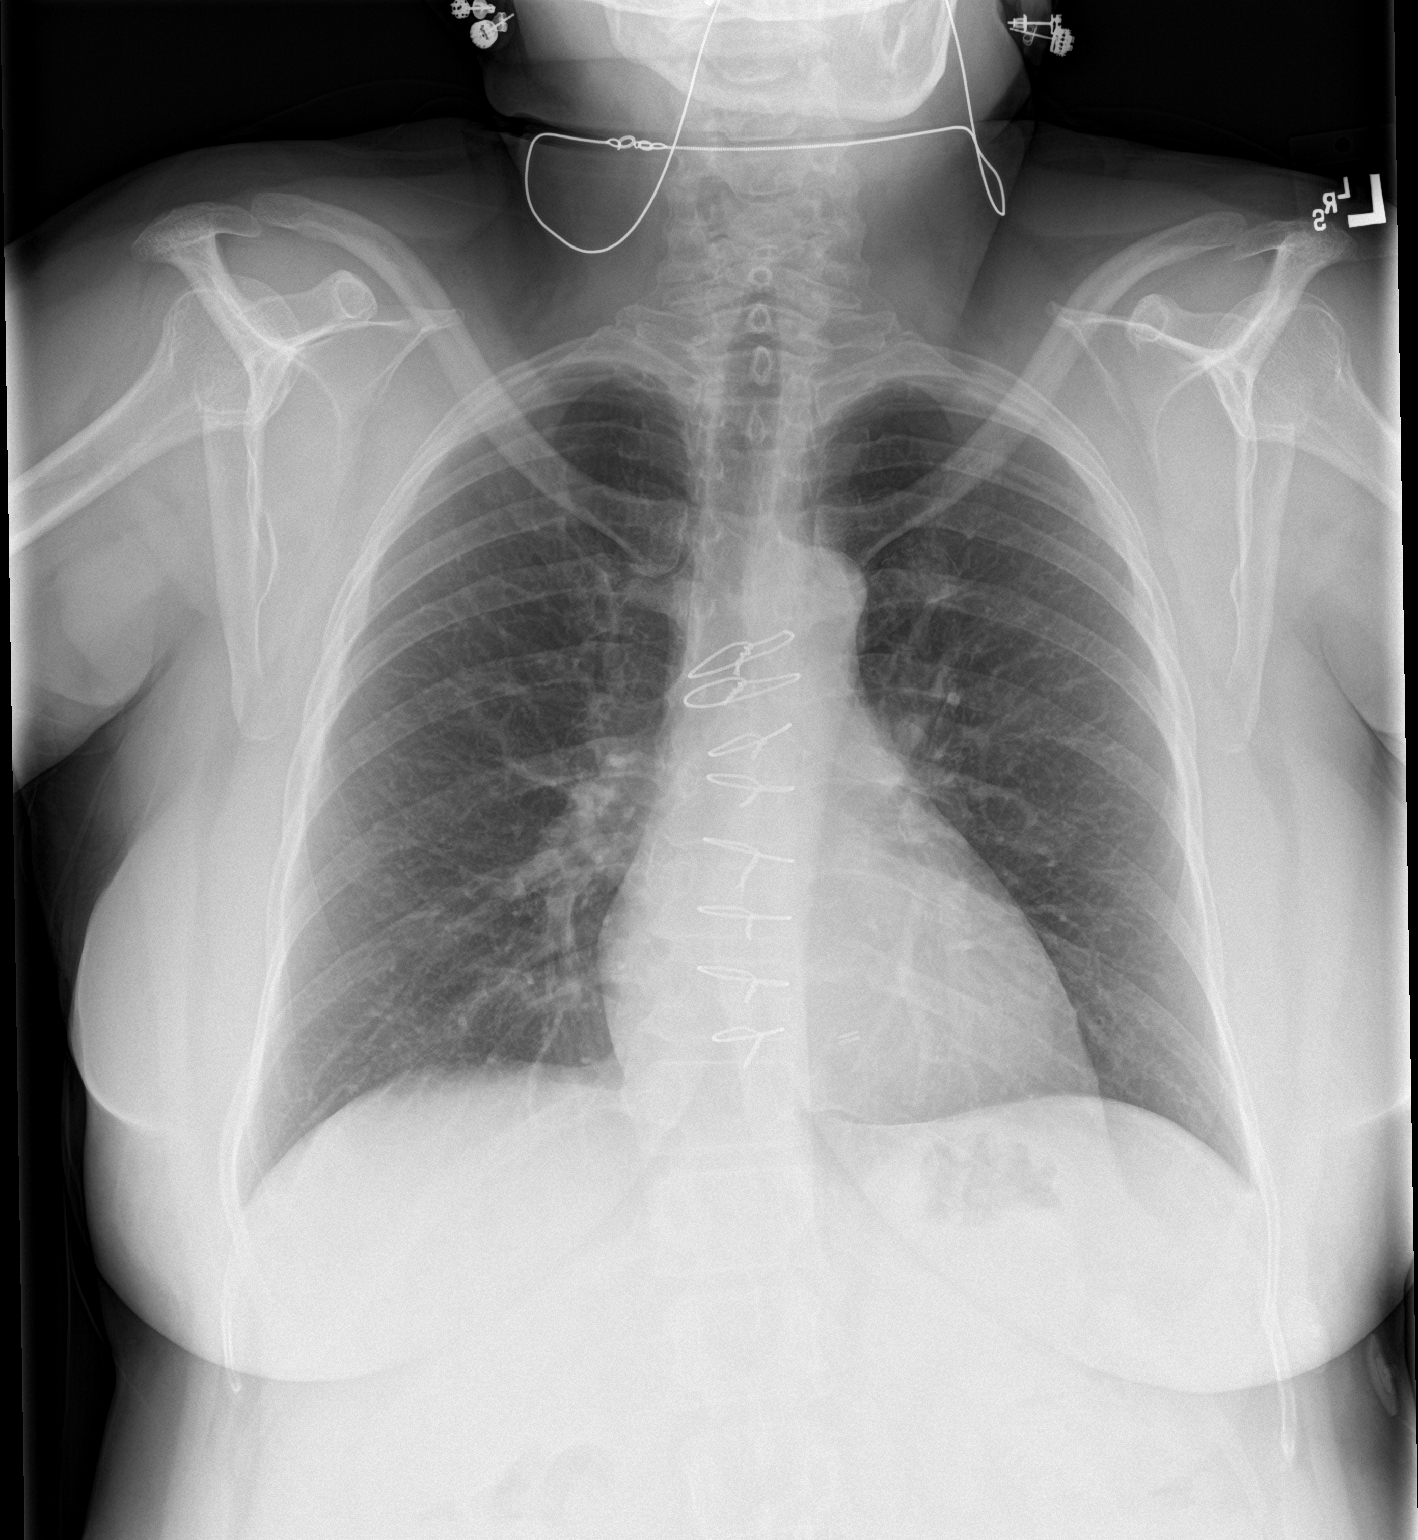

[chest lat]
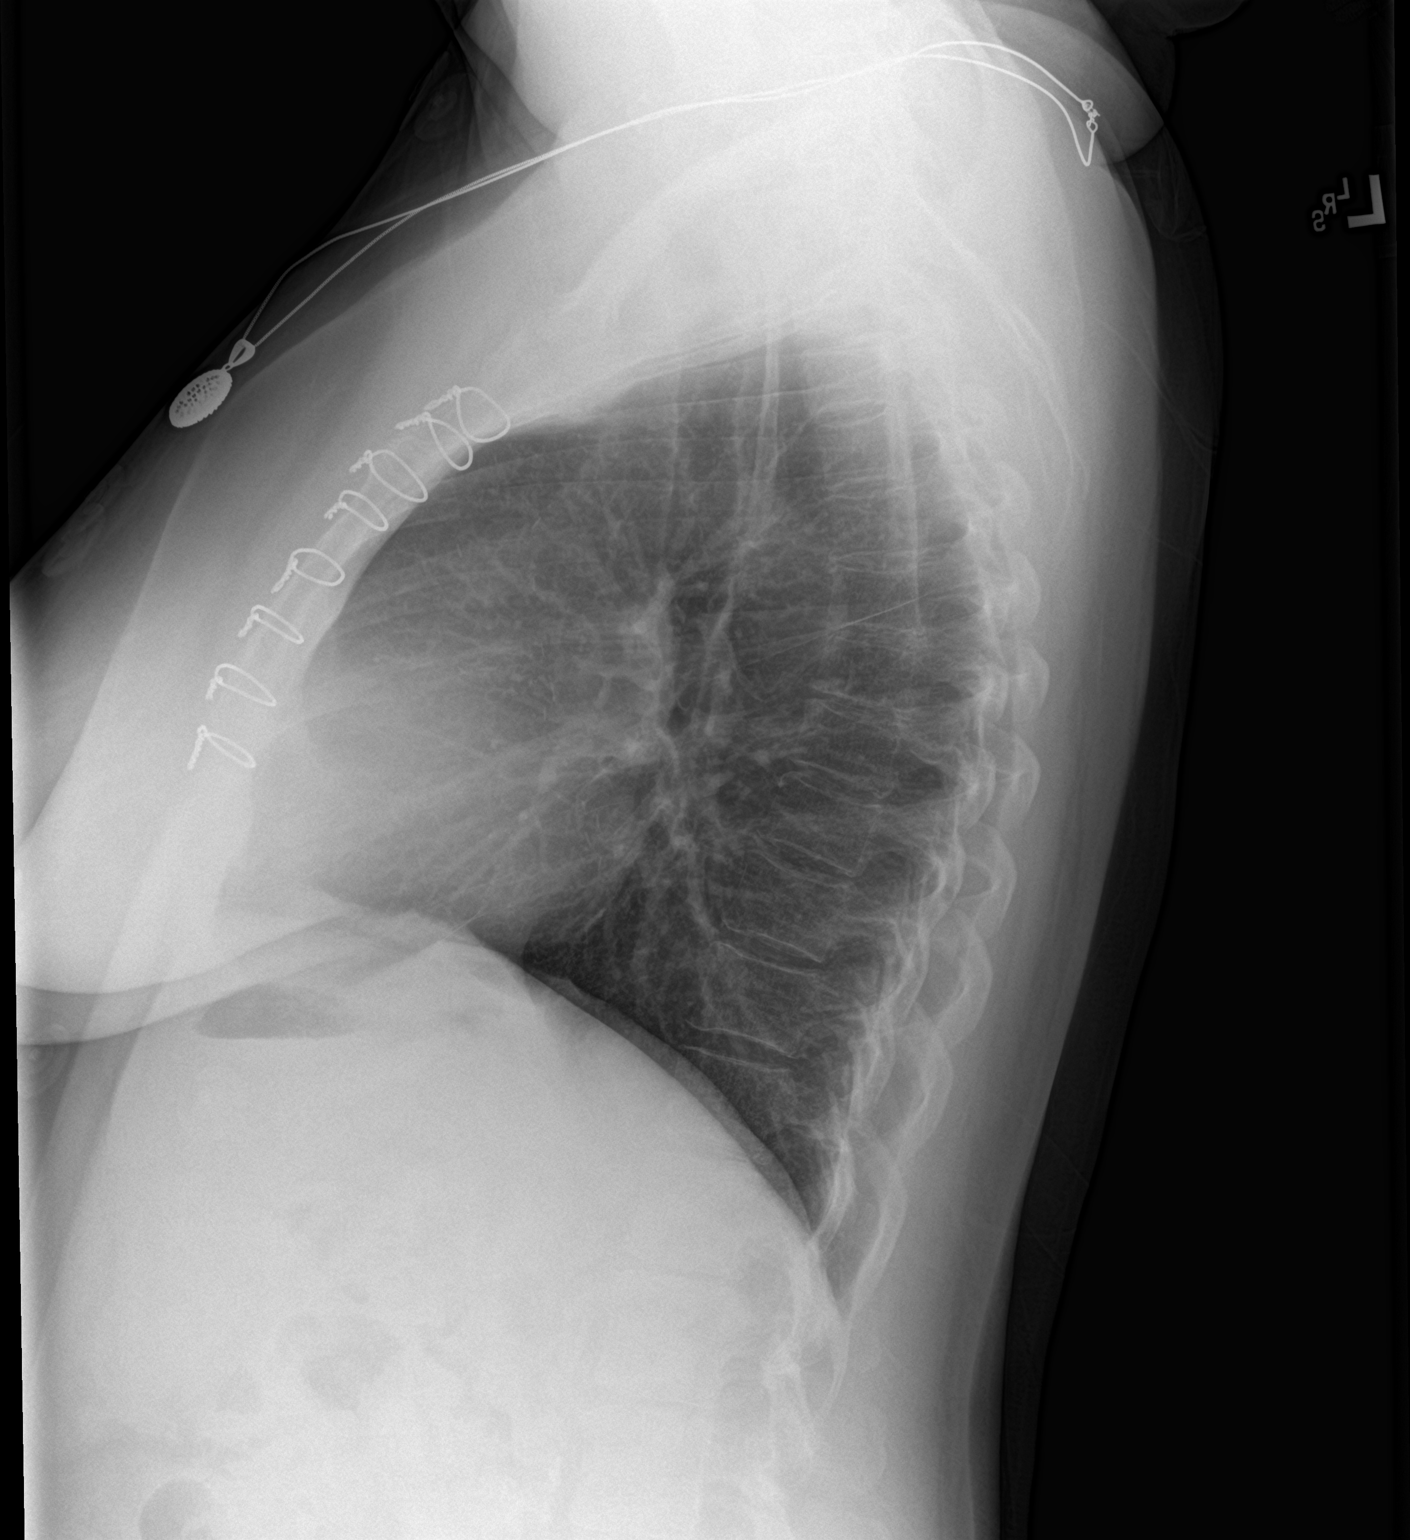

[2 of 2 positions shown; findings below may reference images not displayed]

FINDINGS: Post sternotomy changes. No acute infiltrate or effusion.
Cardiomediastinal silhouette within normal limits. No pneumothorax.
IMPRESSION: No active cardiopulmonary disease.

## 2017-11-08 NOTE — ED Triage Notes (Signed)
Pt reports CP onset 2000. Central, non radiating, 6/10. Pain initially 10/10 but dec after taking 1NTG. Pt also reports SOB and lightheadedness.

## 2017-11-09 ENCOUNTER — Emergency Department (HOSPITAL_COMMUNITY)
Admission: EM | Admit: 2017-11-09 | Discharge: 2017-11-09 | Disposition: A | Discharge status: Home / Self Care | Payer: Medicare HMO | Attending: Emergency Medicine | Admitting: Emergency Medicine

## 2017-11-09 DIAGNOSIS — Z7982 Long term (current) use of aspirin: Secondary | ICD-10-CM | POA: Diagnosis not present

## 2017-11-09 DIAGNOSIS — I2510 Atherosclerotic heart disease of native coronary artery without angina pectoris: Secondary | ICD-10-CM | POA: Diagnosis not present

## 2017-11-09 DIAGNOSIS — R0789 Other chest pain: Secondary | ICD-10-CM

## 2017-11-09 DIAGNOSIS — R69 Illness, unspecified: Secondary | ICD-10-CM | POA: Diagnosis not present

## 2017-11-09 DIAGNOSIS — E119 Type 2 diabetes mellitus without complications: Secondary | ICD-10-CM | POA: Diagnosis not present

## 2017-11-09 DIAGNOSIS — Z951 Presence of aortocoronary bypass graft: Secondary | ICD-10-CM | POA: Diagnosis not present

## 2017-11-09 DIAGNOSIS — Z79899 Other long term (current) drug therapy: Secondary | ICD-10-CM | POA: Diagnosis not present

## 2017-11-09 DIAGNOSIS — Z7902 Long term (current) use of antithrombotics/antiplatelets: Secondary | ICD-10-CM | POA: Diagnosis not present

## 2017-11-09 DIAGNOSIS — I10 Essential (primary) hypertension: Secondary | ICD-10-CM | POA: Diagnosis not present

## 2017-11-09 LAB — TROPONIN I: Troponin I: <0.03 ng/mL (ref <0.03)

## 2017-11-09 MED ORDER — KETOROLAC TROMETHAMINE 30 MG/ML IJ SOLN
30.0000 mg | Freq: Once | INTRAMUSCULAR | Status: AC
  Administered 2017-11-09: 30 mg via INTRAMUSCULAR
  Filled 2017-11-09: qty 1

## 2017-11-09 NOTE — ED Provider Notes (Signed)
[facility] EMERGENCY DEPARTMENT Provider Note   CSN: [id number] Arrival date & time: 11/08/17  2044     History   Chief Complaint Chief Complaint  Patient presents with  . Chest Pain    HPI Samantha Huffman is a 62 y.o. female.  HPI  This is a 62 year old female with a history of coronary artery disease, migraine, dyslipidemia, seizures who presents with chest pain.  Patient reports onset of chest pain at 8 PM.  She reports that it is left-sided, sharp, nonradiating.  At times it is 10 out of 10.  Currently it is 5 out of 10.  It lasts for seconds.  It is not worsened with exertion or breathing.  She denies any fevers or cough.  She has not had any symptoms like this in the past.  She took a nitroglycerin but is unsure if this made it better.  She reports that she was seen and evaluated on the ninth for similar symptoms.  She is due to follow-up with her primary physician at the end of the month and Dr. Radford Pax the first week in April.  Patient denies any leg swelling, history of blood clots, recent long travel.  Past Medical History:  Diagnosis Date  . Carotid artery stenosis    a. Last carotid 07/2015 - 1-39% stenosis .  Marland Kitchen Coronary artery disease    a. s/p CABG 10/2012.  cath 05/2016 with moderate LM stenosis, mild LCx and RCA stenosis and occluded LAD with patent LIMA to LAD, free radial to OM.  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  Marland Kitchen Dyslipidemia   . Generalized convulsive epilepsy without mention of intractable epilepsy   . Hypertension   . Memory deficit   . Migraine    "controlled on daily RX " (06/11/2016)  . Nephrolithiasis 07/30/2015   S/p lithotripsy x 3 and right and left ureter stents  . OSA (obstructive sleep apnea)    hx; was on CPAP; haven't needed it since OHS" (06/11/2016)  . PONV (postoperative nausea and vomiting)   . Seizures ([place])    "controlled w/daily RX; started in 2012; dr thinks they might be from the migraines; last sz was early  part of 2016" (06/11/2016)  . Type II diabetes mellitus ([place])    diet controlled (06/11/2016)  . Vasovagal syncope 07/30/2015   2011    Patient Active Problem List   Diagnosis Date Noted  . Chest pain, rule out acute myocardial infarction 10/30/2017  . Edema 01/25/2017  . Tobacco abuse 06/10/2016  . Headache 09/15/2015  . OSA (obstructive sleep apnea) 07/30/2015  . Nephrolithiasis 07/30/2015  . Vasovagal syncope 07/30/2015  . Carotid artery stenosis, asymptomatic 07/30/2015  . Coronary atherosclerosis of native coronary artery 08/22/2013  . Essential hypertension 08/22/2013  . Diabetes mellitus without complication ([place])   . Dyslipidemia   . Seizures ([place])   . Generalized convulsive epilepsy ([place]) 03/22/2013  . Memory deficit 03/22/2013    Past Surgical History:  Procedure Laterality Date  . CARDIAC CATHETERIZATION  2014; 06/11/2016  . CARDIAC CATHETERIZATION N/A 06/11/2016   Procedure: Left Heart Cath and Cors/Grafts Angiography;  Surgeon: Sherren Mocha, MD;  Location: [place] [facility];  Service: Cardiovascular;  Laterality: N/A;  . CESAREAN SECTION    . CORONARY ANGIOPLASTY    . CORONARY ARTERY BYPASS GRAFT  March, 2014   LIMA to LAD, left radial to LCx  . LITHOTRIPSY  X 3  . TOTAL ABDOMINAL HYSTERECTOMY    . TUBAL LIGATION    .  URETERAL STENT PLACEMENT      OB History    No data available       Home Medications    Prior to Admission medications   Medication Sig Start Date End Date Taking? Authorizing Provider  acetaminophen (TYLENOL) 325 MG tablet Take 2 tablets (650 mg total) by mouth every 4 (four) hours as needed for headache or mild pain. 06/11/16   Isaiah Serge, NP  ALPRAZolam Duanne Moron) 0.25 MG tablet TAKE 1 TABLET BY MOUTH 3 TIMES A DAY AS NEEDED FOR ANXIETY 01/18/17   [provider]  aspirin 81 MG tablet Take 81 mg by mouth daily.    [provider]  atorvastatin (LIPITOR) 80 MG tablet Take 1 tablet (80 mg total) by mouth daily.  02/07/17 02/02/18  Sueanne Margarita, MD  clopidogrel (PLAVIX) 75 MG tablet TAKE ONE TABLET BY MOUTH DAILY 02/01/17   Sueanne Margarita, MD  escitalopram (LEXAPRO) 10 MG tablet Take 10 mg by mouth daily.    [provider]  ezetimibe (ZETIA) 10 MG tablet TAKE 1 TABLET (10 MG TOTAL) BY MOUTH DAILY. 08/17/17   Sueanne Margarita, MD  fexofenadine (ALLEGRA) 60 MG tablet Take 1 tablet (60 mg total) by mouth 2 (two) times daily. 05/24/17   Varney Biles, MD  furosemide (LASIX) 20 MG tablet Take 1 tablet (20 mg total) by mouth daily as needed. Patient taking differently: Take 20 mg by mouth daily as needed for fluid.  04/26/17 10/30/23  Sueanne Margarita, MD  isosorbide mononitrate (IMDUR) 30 MG 24 hr tablet Take 1 tablet (30 mg total) by mouth daily. 10/30/17 10/25/18  Oretha Milch D, MD  levETIRAcetam (KEPPRA) 500 MG tablet TAKE 1 TABLET (500 MG TOTAL) BY MOUTH 2 (TWO) TIMES DAILY. 10/31/17   Kathrynn Ducking, MD  lisinopril (PRINIVIL,ZESTRIL) 20 MG tablet Take 1 tablet (20 mg total) by mouth daily. 02/07/17   Sueanne Margarita, MD  metoprolol succinate (TOPROL-XL) 25 MG 24 hr tablet TAKE 1 TABLET (25 MG TOTAL) BY MOUTH DAILY. 10/31/17   Sueanne Margarita, MD  nitroGLYCERIN (NITROSTAT) 0.4 MG SL tablet Place 1 tablet (0.4 mg total) under the tongue every 5 (five) minutes x 3 doses as needed for chest pain. 06/11/16   Isaiah Serge, NP  topiramate (TOPAMAX) 25 MG tablet Take 3 tablets (75 mg total) by mouth 2 (two) times daily. 10/18/16   Ward Givens, NP    Family History Family History  Problem Relation Age of Onset  . Cancer Father   . Diabetes Father   . Diabetes Brother   . Stroke Brother   . Diabetes Brother   . Heart disease Brother        CAD with CABG  . Heart disease Sister        CAD with MI  . Heart attack Sister     Social History Social History   Tobacco Use  . Smoking status: Current Some Day Smoker    Packs/day: 0.33    Years: 35.00    Pack years: 11.55    Types: Cigarettes     Last attempt to quit: 06/11/2016    Years since quitting: 1.4  . Smokeless tobacco: Former Systems developer    Quit date: 11/20/2012  Substance Use Topics  . Alcohol use: Yes    Comment: 06/11/2016 "might have a few drinks/month; usually wine"  . Drug use: No     Allergies   Depakote [divalproex sodium]; Anesthesia s-i-60; and Penicillins  Review of Systems Review of Systems  Constitutional: Negative for fever.  Respiratory: Negative for cough and shortness of breath.   Cardiovascular: Positive for chest pain. Negative for leg swelling.  Gastrointestinal: Negative for abdominal pain, diarrhea, nausea and vomiting.  Genitourinary: Negative for dysuria.  Musculoskeletal: Negative for back pain.  Neurological: Negative for dizziness.  All other systems reviewed and are negative.    Physical Exam Updated Vital Signs BP (!) 156/66   Pulse 64   Temp 98.4 F (36.9 C) (Oral)   Resp 17   Ht 5\' 1"  (1.549 m)   Wt 68 kg (150 lb)   SpO2 98%   BMI 28.34 kg/m   Physical Exam  Constitutional: She is oriented to person, place, and time. She appears well-developed and well-nourished. No distress.  Sleeping on my arrival  HENT:  Head: Normocephalic and atraumatic.  Cardiovascular: Normal rate, regular rhythm and normal pulses.  Left-sided chest wall tenderness to palpation, no crepitus  Pulmonary/Chest: Effort normal. No respiratory distress. She has no wheezes.  Abdominal: Soft. Bowel sounds are normal.  Musculoskeletal:       Right lower leg: She exhibits no edema.       Left lower leg: She exhibits no edema.  Neurological: She is alert and oriented to person, place, and time.  Skin: Skin is warm and dry.  Psychiatric: She has a normal mood and affect.  Nursing note and vitals reviewed.    ED Treatments / Results  Labs (all labs ordered are listed, but only abnormal results are displayed) Labs Reviewed  BASIC METABOLIC PANEL - Abnormal; Notable for the following components:       Result Value   CO2 21 (*)    Glucose, Bld 153 (*)    All other components within normal limits  CBC - Abnormal; Notable for the following components:   RBC 3.83 (*)    Hemoglobin 11.9 (*)    HCT 35.9 (*)    All other components within normal limits  ETHANOL  RAPID URINE DRUG SCREEN, HOSP PERFORMED  TROPONIN I  TROPONIN I    EKG  EKG Interpretation  Date/Time:  Tuesday November 08 2017 20:54:02 EDT Ventricular Rate:  70 PR Interval:  128 QRS Duration: 90 QT Interval:  384 QTC Calculation: 414 R Axis:   78 Text Interpretation:  Normal sinus rhythm T wave abnormality, consider inferior ischemia T wave abnormality, consider anterolateral ischemia Abnormal ECG No STEMI. Similar to prior.  Confirmed by Nanda Quinton [phone]) on 11/08/2017 8:56:57 PM       Radiology Dg Chest 2 View  Result Date: 11/08/2017 CLINICAL DATA:  Chest pain EXAM: CHEST - 2 VIEW COMPARISON:  10/29/2017 FINDINGS: Post sternotomy changes. No acute infiltrate or effusion. Cardiomediastinal silhouette within normal limits. No pneumothorax. IMPRESSION: No active cardiopulmonary disease. Electronically Signed   By: Donavan Foil M.D.   On: 11/08/2017 21:30    Procedures Procedures (including critical care time)  Medications Ordered in ED Medications  ketorolac (TORADOL) 30 MG/ML injection 30 mg (30 mg Intramuscular Given 11/09/17 0532)     Initial Impression / Assessment and Plan / ED Course  I have reviewed the triage vital signs and the nursing notes.  Pertinent labs & imaging results that were available during my care of the patient were reviewed by me and considered in my medical decision making (see chart for details).     Patient presents with chest pain.  Fairly atypical in nature.  She does have a history of  ACS.  There are also some reproducible components on exam.  EKG shows no evidence of ischemia.  Troponin x2 is negative.  Patient was given Toradol given reproducible nature.  She is low risk for  PE with O2 sats 98% and heart rate 64.  Doubt PE.  On recheck, she is resting and had to be aroused.  She states "I am getting in with 1 of those pains again."  Doubt ACS or other acute emergent process.  Chest x-ray shows no evidence of pneumonia or pneumothorax.  Recommend close follow-up with primary physician and cardiology.  After history, exam, and medical workup I feel the patient has been appropriately medically screened and is safe for discharge home. Pertinent diagnoses were discussed with the patient. Patient was given return precautions.   Final Clinical Impressions(s) / ED Diagnoses   Final diagnoses:  Atypical chest pain    ED Discharge Orders    None       Merryl Hacker, MD 11/09/17 [id number]

## 2017-11-09 NOTE — Discharge Instructions (Signed)
You were seen today for chest pain.  Your heart testing is reassuring.  There is some elements that suggest this may have a musculoskeletal component.  Follow-up with your cardiologist and primary physician.

## 2017-11-11 ENCOUNTER — Ambulatory Visit: Payer: Medicare HMO | Admitting: Neurology

## 2017-11-11 ENCOUNTER — Encounter: Payer: Self-pay | Admitting: Neurology

## 2017-11-11 ENCOUNTER — Other Ambulatory Visit: Payer: Self-pay

## 2017-11-11 VITALS — BP 144/77 | HR 60 | Ht 61.0 in | Wt 152.5 lb

## 2017-11-11 DIAGNOSIS — R413 Other amnesia: Secondary | ICD-10-CM

## 2017-11-11 DIAGNOSIS — G40309 Generalized idiopathic epilepsy and epileptic syndromes, not intractable, without status epilepticus: Secondary | ICD-10-CM

## 2017-11-11 MED ORDER — TOPIRAMATE 25 MG PO TABS: 75.0000 mg | ORAL_TABLET | Freq: Two times a day (BID) | ORAL | 3 refills | Status: DC

## 2017-11-11 NOTE — Progress Notes (Signed)
Reason for visit: Seizures  Samantha Huffman is an 61 y.o. female  History of present illness:  Samantha Huffman is a 62 year old right-handed black female with a history of a seizure disorder that has been well controlled on the combination of Topamax and Keppra.  The patient takes 500 mg twice daily of Keppra, 75 mg twice daily of Topamax.  The patient overall has tolerated the medications fairly well, she does report a mild memory disturbance.  The patient claims that her migraine headaches have essentially disappeared on the Topamax.  She has recently been in the hospital on 2 occasions, on the ninth and then again on 09 November 2017 for atypical chest pain.  The patient will be following up with her cardiologist in the near future.  The patient reports some shortness of breath even with talking.  She is operating a motor vehicle without difficulty.  She does report some occasional insomnia.  She returns to this office for an evaluation.  Past Medical History:  Diagnosis Date  . Carotid artery stenosis    a. Last carotid 07/2015 - 1-39% stenosis .  Marland Kitchen Coronary artery disease    a. s/p CABG 10/2012.  cath 05/2016 with moderate LM stenosis, mild LCx and RCA stenosis and occluded LAD with patent LIMA to LAD, free radial to OM.  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  Marland Kitchen Dyslipidemia   . Generalized convulsive epilepsy without mention of intractable epilepsy   . Hypertension   . Memory deficit   . Migraine    "controlled on daily RX " (06/11/2016)  . Nephrolithiasis 07/30/2015   S/p lithotripsy x 3 and right and left ureter stents  . OSA (obstructive sleep apnea)    hx; was on CPAP; haven't needed it since OHS" (06/11/2016)  . PONV (postoperative nausea and vomiting)   . Seizures ([place])    "controlled w/daily RX; started in 2012; dr thinks they might be from the migraines; last sz was early part of 2016" (06/11/2016)  . Type II diabetes mellitus ([place])    diet controlled (06/11/2016)  .  Vasovagal syncope 07/30/2015   2011    Past Surgical History:  Procedure Laterality Date  . CARDIAC CATHETERIZATION  2014; 06/11/2016  . CARDIAC CATHETERIZATION N/A 06/11/2016   Procedure: Left Heart Cath and Cors/Grafts Angiography;  Surgeon: Sherren Mocha, MD;  Location: [place] [facility];  Service: Cardiovascular;  Laterality: N/A;  . CESAREAN SECTION    . CORONARY ANGIOPLASTY    . CORONARY ARTERY BYPASS GRAFT  March, 2014   LIMA to LAD, left radial to LCx  . LITHOTRIPSY  X 3  . TOTAL ABDOMINAL HYSTERECTOMY    . TUBAL LIGATION    . URETERAL STENT PLACEMENT      Family History  Problem Relation Age of Onset  . Cancer Father   . Diabetes Father   . Diabetes Brother   . Stroke Brother   . Diabetes Brother   . Heart disease Brother        CAD with CABG  . Heart disease Sister        CAD with MI  . Heart attack Sister     Social history:  reports that she has been smoking cigarettes.  She has a 11.55 pack-year smoking history. She quit smokeless tobacco use about 4 years ago. She reports that she drinks alcohol. She reports that she does not use drugs.    Allergies  Allergen Reactions  . Depakote [Divalproex Sodium] Other (  See Comments)    Hyperammonemia  . Anesthesia S-I-60     Nausea per pt  . Penicillins     Other reaction(s): GI intolerance Has patient had a PCN reaction causing immediate rash, facial/tongue/throat swelling, SOB or lightheadedness with hypotension: YES Has patient had a PCN reaction causing severe rash involving mucus membranes or skin necrosis: NO Has patient had a PCN reaction that required hospitalizationNO Has patient had a PCN reaction occurring within the last 10 years: NO If all of the above answers are "NO", then may proceed with Cephalosporin use.    Medications:  Prior to Admission medications   Medication Sig Start Date End Date Taking? Authorizing Provider  acetaminophen (TYLENOL) 325 MG tablet Take 2 tablets (650 mg total) by mouth  every 4 (four) hours as needed for headache or mild pain. 06/11/16  Yes Isaiah Serge, NP  ALPRAZolam (XANAX) 0.25 MG tablet TAKE 1 TABLET BY MOUTH 3 TIMES A DAY AS NEEDED FOR ANXIETY 01/18/17  Yes [provider]  aspirin 81 MG tablet Take 81 mg by mouth daily.   Yes [provider]  atorvastatin (LIPITOR) 80 MG tablet Take 1 tablet (80 mg total) by mouth daily. 02/07/17 02/02/18 Yes Turner, Eber Hong, MD  clopidogrel (PLAVIX) 75 MG tablet TAKE ONE TABLET BY MOUTH DAILY 02/01/17  Yes Turner, Traci R, MD  escitalopram (LEXAPRO) 10 MG tablet Take 10 mg by mouth daily.   Yes [provider]  ezetimibe (ZETIA) 10 MG tablet TAKE 1 TABLET (10 MG TOTAL) BY MOUTH DAILY. 08/17/17  Yes Turner, Eber Hong, MD  furosemide (LASIX) 20 MG tablet Take 1 tablet (20 mg total) by mouth daily as needed. Patient taking differently: Take 20 mg by mouth daily as needed for fluid.  04/26/17 10/30/23 Yes Turner, Eber Hong, MD  isosorbide mononitrate (IMDUR) 30 MG 24 hr tablet Take 1 tablet (30 mg total) by mouth daily. 10/30/17 10/25/18 Yes Oretha Milch D, MD  levETIRAcetam (KEPPRA) 500 MG tablet TAKE 1 TABLET (500 MG TOTAL) BY MOUTH 2 (TWO) TIMES DAILY. 10/31/17  Yes Kathrynn Ducking, MD  lisinopril (PRINIVIL,ZESTRIL) 20 MG tablet Take 1 tablet (20 mg total) by mouth daily. 02/07/17  Yes Turner, Eber Hong, MD  metoprolol succinate (TOPROL-XL) 25 MG 24 hr tablet TAKE 1 TABLET (25 MG TOTAL) BY MOUTH DAILY. 10/31/17  Yes Turner, Eber Hong, MD  nitroGLYCERIN (NITROSTAT) 0.4 MG SL tablet Place 1 tablet (0.4 mg total) under the tongue every 5 (five) minutes x 3 doses as needed for chest pain. 06/11/16  Yes Isaiah Serge, NP  topiramate (TOPAMAX) 25 MG tablet Take 3 tablets (75 mg total) by mouth 2 (two) times daily. 11/11/17  Yes Kathrynn Ducking, MD    ROS:  Out of a complete 14 system review of symptoms, the patient complains only of the following symptoms, and all other reviewed systems are negative.  Chest  pain  Blood pressure (!) 144/77, pulse 60, height 5\' 1"  (1.549 m), weight 152 lb 8 oz (69.2 kg).  Physical Exam  General: The patient is alert and cooperative at the time of the examination.  Skin: No significant peripheral edema is noted.   Neurologic Exam  Mental status: The patient is alert and oriented x 3 at the time of the examination. The patient has apparent normal recent and remote memory, with an apparently normal attention span and concentration ability.   Cranial nerves: Facial symmetry is present. Speech is normal, no aphasia or dysarthria is  noted. Extraocular movements are full. Visual fields are full.  Motor: The patient has good strength in all 4 extremities.  Sensory examination: Soft touch sensation is symmetric on the face, arms, and legs.  Coordination: The patient has good finger-nose-finger and heel-to-shin bilaterally.  Gait and station: The patient has a normal gait. Tandem gait is normal. Romberg is negative. No drift is seen.  Reflexes: Deep tendon reflexes are symmetric.   Assessment/Plan:  1.  History of seizures, well controlled  2.  Migraine headaches, well controlled  3.  Mild memory disturbance  The patient is doing quite well on the current regimen.  A prescription for the Topamax was sent in.  The patient will follow-up through this office in 1 year, sooner if needed.  Jill Alexanders MD 11/11/2017 11:27 AM  [facility] [address] [place], [place] [address]  Phone [phone] Fax [phone]

## 2017-11-14 DIAGNOSIS — R7309 Other abnormal glucose: Secondary | ICD-10-CM | POA: Diagnosis not present

## 2017-11-14 DIAGNOSIS — R079 Chest pain, unspecified: Secondary | ICD-10-CM | POA: Diagnosis not present

## 2017-11-14 DIAGNOSIS — I2510 Atherosclerotic heart disease of native coronary artery without angina pectoris: Secondary | ICD-10-CM | POA: Diagnosis not present

## 2017-11-18 ENCOUNTER — Telehealth: Payer: Self-pay | Admitting: Cardiology

## 2017-11-18 NOTE — Telephone Encounter (Signed)
Not sure I understand - we ordered split night sleep study to get new CPAP since she lost hers and has not had a recent study done

## 2017-11-18 NOTE — Addendum Note (Signed)
Addended by: Freada Bergeron on: 11/18/2017 10:54 AM   Modules accepted: Orders

## 2017-11-18 NOTE — Telephone Encounter (Signed)
Message sent to pre cert asking if patient insurance ever approved for her titration.

## 2017-11-18 NOTE — Telephone Encounter (Signed)
FW: New Sleep Study vs. New DME Charm Rings Heloise Ochoa, CMA        Nina   Change of plan: Don't call pt yet. I will update you tomorrow.   Per Dr. Radford Pax we are going to try and get a titration approved so when pt gets her new machine we know what pressure.    I am setting up an MD Review for Dr. Radford Pax to do as they as currently going to deny the split night.

## 2017-11-18 NOTE — Telephone Encounter (Signed)
Returned call to [employer]. Per staff member sleep or titration study is pending denial for non supported documentation. Replace machine and use prior diagnoses. Will forward to [facility] and Dr. Radford Pax for review.

## 2017-11-18 NOTE — Telephone Encounter (Signed)
New Message:    [employer] in reference to a pre-authorization for patient ordered by dr. Radford Pax

## 2017-11-21 NOTE — Telephone Encounter (Signed)
Resubmitted order to pre cert 7/67 awaiting response.

## 2017-11-22 NOTE — Progress Notes (Signed)
Cardiology Office Note    Date:  11/23/2017   ID:  Samantha Huffman, DOB 10/16/1955, MRN [id number]  PCP:  Alroy Dust, Carlean Jews.Marlou Sa, MD  Cardiologist: Fransico Him, MD  No chief complaint on file.   History of Present Illness:  Samantha Huffman is a 62 y.o. 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   Stress test 05/2016 ischemia with follow-up cath patent LIMA to the LAD and free radial to OM with 30% in the RCA.  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  Last 2D echo 01/2017 normal LVEF no wall motion abnormalities.  She was seen in the hospital by Dr. Harl Bowie 10/30/17 for chest pain that was felt to be atypical worse with position and breathing and left chest wall was tender to palpation.  Troponins negative x3 other labs stable.  No acute EKG changes.  Was felt she had musculoskeletal chest pain and was given a one-time dose of Toradol and short course of NSAIDs.  Patient was back in the ER 11/09/17 with sharp shooting chest pains troponins negative and EKG unchanged.  Discharge without any further workup.  Patient comes in today accompanied by her daughter.  Complains of ongoing chest pain-heaviness, sharp shooting pain, dyspnea with little exertion. Pain comes and goes-can last up to and hour.  Has not used nitroglycerin at home.  Chest pain now.  EKG unchanged.  Grabs her chest.  Denies any GI symptoms or musculoskeletal strain.  Says she took the NSAIDs for short time and seemed to help some.  Quit smoking 1 month ago.  Was also told she was diabetic and is being followed by primary care.   Past Medical History:  Diagnosis Date  . Carotid artery stenosis    a. Last carotid 07/2015 - 1-39% stenosis .  Marland Kitchen Coronary artery disease    a. s/p CABG 10/2012.  cath 05/2016 with moderate LM stenosis, mild LCx and RCA stenosis and occluded LAD with  patent LIMA to LAD, free radial to OM.  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  Marland Kitchen Dyslipidemia   . Generalized convulsive epilepsy without mention of intractable epilepsy   . Hypertension   . Memory deficit   . Migraine    "controlled on daily RX " (06/11/2016)  . Nephrolithiasis 07/30/2015   S/p lithotripsy x 3 and right and left ureter stents  . OSA (obstructive sleep apnea)    hx; was on CPAP; haven't needed it since OHS" (06/11/2016)  . PONV (postoperative nausea and vomiting)   . Seizures ([place])    "controlled w/daily RX; started in 2012; dr thinks they might be from the migraines; last sz was early part of 2016" (06/11/2016)  . Type II diabetes mellitus ([place])    diet controlled (06/11/2016)  . Vasovagal syncope 07/30/2015   2011    Past Surgical History:  Procedure Laterality Date  . CARDIAC CATHETERIZATION  2014; 06/11/2016  . CARDIAC CATHETERIZATION N/A 06/11/2016   Procedure: Left Heart Cath and Cors/Grafts Angiography;  Surgeon: Sherren Mocha, MD;  Location: [facility];  Service: Cardiovascular;  Laterality: N/A;  . CESAREAN SECTION    . CORONARY ANGIOPLASTY    . CORONARY ARTERY BYPASS GRAFT  March, 2014   LIMA to LAD, left radial to LCx  . LITHOTRIPSY  X 3  . TOTAL ABDOMINAL HYSTERECTOMY    .  TUBAL LIGATION    . URETERAL STENT PLACEMENT      Current Medications: Current Meds  Medication Sig  . acetaminophen (TYLENOL) 325 MG tablet Take 2 tablets (650 mg total) by mouth every 4 (four) hours as needed for headache or mild pain.  Marland Kitchen ALPRAZolam (XANAX) 0.25 MG tablet TAKE 1 TABLET BY MOUTH 3 TIMES A DAY AS NEEDED FOR ANXIETY  . aspirin 81 MG tablet Take 81 mg by mouth daily.  Marland Kitchen atorvastatin (LIPITOR) 80 MG tablet Take 1 tablet (80 mg total) by mouth daily.  . clopidogrel (PLAVIX) 75 MG tablet TAKE ONE TABLET BY MOUTH DAILY  . escitalopram (LEXAPRO) 10 MG tablet Take 10 mg by mouth daily.  Marland Kitchen ezetimibe (ZETIA) 10 MG tablet TAKE 1 TABLET (10 MG TOTAL) BY MOUTH  DAILY.  . furosemide (LASIX) 20 MG tablet Take 20 mg by mouth as needed for fluid.  Marland Kitchen levETIRAcetam (KEPPRA) 500 MG tablet TAKE 1 TABLET (500 MG TOTAL) BY MOUTH 2 (TWO) TIMES DAILY.  Marland Kitchen lisinopril (PRINIVIL,ZESTRIL) 20 MG tablet Take 1 tablet (20 mg total) by mouth daily.  . metoprolol succinate (TOPROL-XL) 25 MG 24 hr tablet TAKE 1 TABLET (25 MG TOTAL) BY MOUTH DAILY.  . nitroGLYCERIN (NITROSTAT) 0.4 MG SL tablet Place 1 tablet (0.4 mg total) under the tongue every 5 (five) minutes x 3 doses as needed for chest pain.  Marland Kitchen topiramate (TOPAMAX) 25 MG tablet Take 3 tablets (75 mg total) by mouth 2 (two) times daily.  . [DISCONTINUED] isosorbide mononitrate (IMDUR) 30 MG 24 hr tablet Take 1 tablet (30 mg total) by mouth daily.     Allergies:   Depakote [divalproex sodium]; Anesthesia s-i-60; and Penicillins   Social History   Socioeconomic History  . Marital status: Divorced    Spouse name: Not on file  . Number of children: 2  . Years of education: 27  . Highest education level: Not on file  Occupational History  . Occupation: retired  Scientific laboratory technician  . Financial resource strain: Not on file  . Food insecurity:    Worry: Not on file    Inability: Not on file  . Transportation needs:    Medical: Not on file    Non-medical: Not on file  Tobacco Use  . Smoking status: Current Some Day Smoker    Packs/day: 0.33    Years: 35.00    Pack years: 11.55    Types: Cigarettes    Last attempt to quit: 06/11/2016    Years since quitting: 1.4  . Smokeless tobacco: Former Systems developer    Quit date: 11/20/2012  Substance and Sexual Activity  . Alcohol use: Yes    Comment: 06/11/2016 "might have a few drinks/month; usually wine"  . Drug use: No  . Sexual activity: Not Currently  Lifestyle  . Physical activity:    Days per week: Not on file    Minutes per session: Not on file  . Stress: Not on file  Relationships  . Social connections:    Talks on phone: Not on file    Gets together: Not on file     Attends religious service: Not on file    Active member of club or organization: Not on file    Attends meetings of clubs or organizations: Not on file    Relationship status: Not on file  Other Topics Concern  . Not on file  Social History Narrative   Patient drinks about 1 cup of coffee daily.   Patient is right  handed.      Family History:  The patient's family history includes Cancer in her father; Diabetes in her brother, brother, and father; Heart attack in her sister; Heart disease in her brother and sister; Stroke in her brother.   ROS:   Please see the history of present illness.    Review of Systems  Constitution: Negative.  HENT: Negative.   Eyes: Negative.   Cardiovascular: Positive for chest pain and dyspnea on exertion.  Respiratory: Negative.   Hematologic/Lymphatic: Negative.   Musculoskeletal: Negative.  Negative for joint pain.  Gastrointestinal: Negative.   Genitourinary: Negative.   Neurological: Negative.    All other systems reviewed and are negative.   PHYSICAL EXAM:   VS:  BP 140/82 (BP Location: Right Arm, Patient Position: Sitting, Cuff Size: Normal)   Pulse (!) 57   Ht 5\' 1"  (1.549 m)   Wt 149 lb (67.6 kg)   SpO2 98%   BMI 28.15 kg/m   Physical Exam  GEN: Well nourished, well developed, in no acute distress  Neck: no JVD, carotid bruits, or masses Cardiac:RRR; no murmurs, rubs, or gallops  Respiratory:  clear to auscultation bilaterally, normal work of breathing GI: soft, nontender, nondistended, + BS Ext: without cyanosis, clubbing, or edema, Good distal pulses bilaterally Neuro:  Alert and Oriented x 3,  Psych: euthymic mood, full affect  Wt Readings from Last 3 Encounters:  11/23/17 149 lb (67.6 kg)  11/11/17 152 lb 8 oz (69.2 kg)  11/08/17 150 lb (68 kg)      Studies/Labs Reviewed:   EKG:  EKG is  ordered today.  The ekg ordered today demonstrates sinus bradycardia at 57 bpm with T wave inversion inferior lateral similar to prior  EKGs, no change  Recent Labs: 11/08/2017: BUN 10; Creatinine, Ser 0.98; Hemoglobin 11.9; Platelets 190; Potassium 3.8; Sodium 139   Lipid Panel    Component Value Date/Time   CHOL 216 (H) 06/11/2016 0521   TRIG 86 06/11/2016 0521   HDL 48 06/11/2016 0521   CHOLHDL 4.5 06/11/2016 0521   VLDL 17 06/11/2016 0521   LDLCALC 151 (H) 06/11/2016 0521    Additional studies/ records that were reviewed today include:  Echo: 6/18   Study Conclusions   - Left ventricle: The cavity size was normal. There was mild focal   basal hypertrophy of the septum. Systolic function was normal.   The estimated ejection fraction was in the range of 60% to 65%.   Wall motion was normal; there were no regional wall motion   abnormalities. Left ventricular diastolic function parameters   were normal. - Aortic valve: Trileaflet; mildly thickened, mildly calcified   leaflets. - Mitral valve: There was mild regurgitation.       ASSESSMENT:    1. Atherosclerosis of native coronary artery of native heart without angina pectoris   2. Essential hypertension   3. OSA (obstructive sleep apnea)   4. Chest discomfort   5. Tobacco abuse   6. Dyslipidemia   7. Diabetes mellitus without complication (HCC)      PLAN:  In order of problems listed above:  CAD status post CABG x2 in 2014 with LIMA to the LAD and free radial to OM.  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  2 recent emergency room visits with chest pain felt to be atypical treated with Toradol and NSAIDs.  Troponins negative EKG without change.  Patient still with ongoing chest pain.  EKG unchanged.  Will check troponin today.  Order exercise Myoview.  Was abnormal with 2017 that led to cath but at least will have a comparison to make sure there is no new defects.  Does have evidence of microvascular disease so will increase Imdur to 60 mg daily.  Follow-up with Dr.  Radford Pax after stress test.  Essential hypertension blood pressure up today.  Increase Imdur to 60 mg daily.  Consider amlodipine in the future.  OSA on CPAP but lost her machine  Tobacco abuse patient quit smoking 1 month ago.  Hyperlipidemia on Lipitor 80 mg daily  Diabetes mellitus new diagnosis for this patient and being followed by primary care.  Medication Adjustments/Labs and Tests Ordered: Current medicines are reviewed at length with the patient today.  Concerns regarding medicines are outlined above.  Medication changes, Labs and Tests ordered today are listed in the Patient Instructions below. Patient Instructions  Medication Instructions:  Your physician has recommended you make the following change in your medication:   INCREASE: isosorbide mononitrate (imdur) to 60 mg daily  Labwork: TODAY: STAT Troponin  Testing/Procedures: Your physician has requested that you have en exercise stress myoview. For further information please visit [email or web address]. Please follow instruction sheet, as given.   Follow-Up: Your physician recommends that you schedule a follow-up appointment after stress test with Dr. Radford Pax    Any Other Special Instructions Will Be Listed Below (If Applicable).     If you need a refill on your cardiac medications before your next appointment, please call your pharmacy.      Sumner Boast, PA-C  11/23/2017 9:43 AM    [facility] [address], [place], [place]  [address] Phone: [phone]; Fax: [phone]

## 2017-11-23 ENCOUNTER — Encounter: Payer: Self-pay | Admitting: Physician Assistant

## 2017-11-23 ENCOUNTER — Ambulatory Visit: Payer: Medicare HMO | Admitting: Physician Assistant

## 2017-11-23 VITALS — BP 140/82 | HR 57 | Ht 61.0 in | Wt 149.0 lb

## 2017-11-23 DIAGNOSIS — R0789 Other chest pain: Secondary | ICD-10-CM | POA: Diagnosis not present

## 2017-11-23 DIAGNOSIS — E119 Type 2 diabetes mellitus without complications: Secondary | ICD-10-CM | POA: Diagnosis not present

## 2017-11-23 DIAGNOSIS — G4733 Obstructive sleep apnea (adult) (pediatric): Secondary | ICD-10-CM

## 2017-11-23 DIAGNOSIS — I2510 Atherosclerotic heart disease of native coronary artery without angina pectoris: Secondary | ICD-10-CM

## 2017-11-23 DIAGNOSIS — Z720 Tobacco use: Secondary | ICD-10-CM

## 2017-11-23 DIAGNOSIS — I10 Essential (primary) hypertension: Secondary | ICD-10-CM | POA: Diagnosis not present

## 2017-11-23 DIAGNOSIS — E785 Hyperlipidemia, unspecified: Secondary | ICD-10-CM

## 2017-11-23 LAB — TROPONIN T

## 2017-11-23 MED ORDER — ISOSORBIDE MONONITRATE ER 60 MG PO TB24: 60.0000 mg | ORAL_TABLET | Freq: Every day | ORAL | 3 refills | Status: DC

## 2017-11-23 NOTE — Telephone Encounter (Signed)
-----   Message ----- From: Almyra Free Sent: 11/23/2017   1:41 PM To: Freada Bergeron, CMA Subject: in lb denied                                   In lab was denied. Even though we stated that we do not know her pressures and need the titration to determine pressures, her insurance determined that retitration is not necessary. We can appeal or do a home titration.

## 2017-11-23 NOTE — Patient Instructions (Signed)
Medication Instructions:  Your physician has recommended you make the following change in your medication:   INCREASE: isosorbide mononitrate (imdur) to 60 mg daily  Labwork: TODAY: STAT Troponin  Testing/Procedures: Your physician has requested that you have en exercise stress myoview. For further information please visit [email or web address]. Please follow instruction sheet, as given.   Follow-Up: Your physician recommends that you schedule a follow-up appointment after stress test with Dr. Radford Pax    Any Other Special Instructions Will Be Listed Below (If Applicable).     If you need a refill on your cardiac medications before your next appointment, please call your pharmacy.

## 2017-11-24 ENCOUNTER — Telehealth (HOSPITAL_COMMUNITY): Payer: Self-pay | Admitting: *Deleted

## 2017-11-24 NOTE — Telephone Encounter (Signed)
Patient given detailed instructions per Myocardial Perfusion Study Information Sheet for the test on 11/29/17. Patient notified to arrive 15 minutes early and that it is imperative to arrive on time for appointment to keep from having the test rescheduled.  If you need to cancel or reschedule your appointment, please call the office within 24 hours of your appointment. . Patient verbalized understanding. Kirstie Peri

## 2017-11-25 ENCOUNTER — Telehealth: Payer: Self-pay | Admitting: *Deleted

## 2017-11-25 DIAGNOSIS — G4733 Obstructive sleep apnea (adult) (pediatric): Secondary | ICD-10-CM

## 2017-11-25 NOTE — Telephone Encounter (Signed)
-----   Message from Sueanne Margarita, MD sent at 11/23/2017  5:27 PM EDT ----- Regarding: RE: in lb denied Please order home sleep study there was a long ----- Message ----- From: Freada Bergeron, CMA Sent: 11/23/2017   2:39 PM To: Sueanne Margarita, MD Subject: FW: in lb denied                               Insurance will pre cert for a APAP. Please advise ----- Message ----- From: Almyra Free Sent: 11/23/2017   1:41 PM To: Freada Bergeron, CMA Subject: in lb denied                                   In lab was denied. Even though we stated that we do not know her pressures and need the titration to determine pressures, her insurance determined that retitration is not necessary. We can appeal or do a home titration. Amy

## 2017-11-25 NOTE — Telephone Encounter (Addendum)
Home Sleep Study ordered through [facility] home sleep testing.

## 2017-11-29 ENCOUNTER — Ambulatory Visit (HOSPITAL_COMMUNITY): Payer: Medicare HMO | Attending: Cardiology

## 2017-11-29 DIAGNOSIS — I2510 Atherosclerotic heart disease of native coronary artery without angina pectoris: Secondary | ICD-10-CM | POA: Diagnosis not present

## 2017-11-29 DIAGNOSIS — Z8249 Family history of ischemic heart disease and other diseases of the circulatory system: Secondary | ICD-10-CM | POA: Diagnosis not present

## 2017-11-29 DIAGNOSIS — I6529 Occlusion and stenosis of unspecified carotid artery: Secondary | ICD-10-CM | POA: Insufficient documentation

## 2017-11-29 DIAGNOSIS — R0789 Other chest pain: Secondary | ICD-10-CM

## 2017-11-29 LAB — MYOCARDIAL PERFUSION IMAGING
CHL CUP MPHR: 159 {beats}/min
CHL CUP NUCLEAR SDS: 3
CHL CUP NUCLEAR SSS: 9
CHL CUP RESTING HR STRESS: 69 {beats}/min
CSEPED: 5 min
CSEPEW: 7 METS
Exercise duration (sec): 30 s
LHR: 0.21
LV sys vol: 29 mL
LVDIAVOL: 68 mL (ref 46–106)
NUC STRESS TID: 1.04
Peak HR: 144 {beats}/min
Percent HR: 90 %
SRS: 10

## 2017-11-29 IMAGING — NM NM MYOCAR PERF WALL MOTION
5 series · 30 of 30 positions shown · non-contrast
Comparison: none

[Series 1: rest · 6.51mm/px · 6 of 64 frames shown]
[frame 6/64]
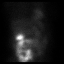
[frame 16/64]
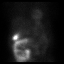
[frame 27/64]
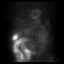
[frame 38/64]
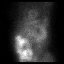
[frame 48/64]
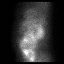
[frame 59/64]
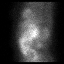

[Series 1: wbr_r-proj_st rest · 6.51mm/px · 6 of 64 frames shown]
[frame 6/64]
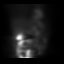
[frame 16/64]
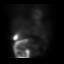
[frame 27/64]
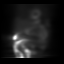
[frame 38/64]
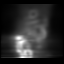
[frame 48/64]
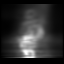
[frame 59/64]
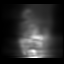

[Series 2: wbr_s-proj_st stress - gated · 6.51mm/px · 6 of 512 frames shown]
[frame 43/512]
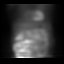
[frame 128/512]
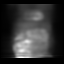
[frame 214/512]
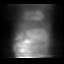
[frame 299/512]
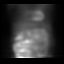
[frame 384/512]
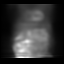
[frame 470/512]
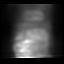

[Series 2: stress - gated · 6.51mm/px · 6 of 508 frames shown]
[frame 43/508]
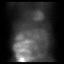
[frame 127/508]
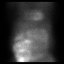
[frame 212/508]
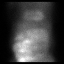
[frame 297/508]
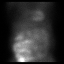
[frame 381/508]
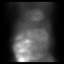
[frame 466/508]
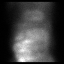

[Series 3: stress - perfusion · 6.51mm/px · 6 of 64 frames shown]
[frame 6/64]
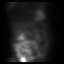
[frame 16/64]
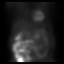
[frame 27/64]
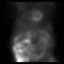
[frame 38/64]
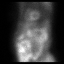
[frame 48/64]
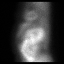
[frame 59/64]
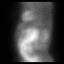

[30 of 30 positions shown; findings below may reference images not displayed]

Canned report from images found in remote index.

Refer to host system for actual result text.

## 2017-11-29 MED ORDER — TECHNETIUM TC 99M TETROFOSMIN IV KIT
10.2000 | PACK | Freq: Once | INTRAVENOUS | Status: AC | PRN
  Administered 2017-11-29: 10.2 via INTRAVENOUS
  Filled 2017-11-29: qty 11

## 2017-11-29 MED ORDER — TECHNETIUM TC 99M TETROFOSMIN IV KIT
31.2000 | PACK | Freq: Once | INTRAVENOUS | Status: AC | PRN
  Administered 2017-11-29: 31.2 via INTRAVENOUS
  Filled 2017-11-29: qty 32

## 2017-11-29 NOTE — Progress Notes (Signed)
Pt has been made aware of normal result and verbalized understanding.  jw 11/29/17

## 2017-12-01 NOTE — Telephone Encounter (Addendum)
Patient is aware and agreeable to Home Sleep Study through [facility]. Patient is scheduled for 5/8 at 12 pm to pick up home sleep kit and meet with Respiratory therapist at [facility]. Patient is aware that if this appointment date and time does not work for them they should contact Artis Delay directly at [phone]. Patient is aware that a sleep packet will be sent from [facility] in week.  Patient wishes to verify coverage with insurance and if test is too expressive she will cancel with [facility] long Sleep center. Patient was informed to call her insurance to see if she has a patient portion to pay. Patient is agreeable to treatment.

## 2017-12-01 NOTE — Addendum Note (Signed)
Addended by: Freada Bergeron on: 12/01/2017 02:45 PM   Modules accepted: Orders

## 2017-12-19 ENCOUNTER — Ambulatory Visit: Payer: Medicare HMO | Admitting: Cardiology

## 2017-12-19 VITALS — BP 126/64 | HR 68 | Ht 61.0 in | Wt 151.0 lb

## 2017-12-19 DIAGNOSIS — N760 Acute vaginitis: Secondary | ICD-10-CM | POA: Diagnosis not present

## 2017-12-19 DIAGNOSIS — I10 Essential (primary) hypertension: Secondary | ICD-10-CM

## 2017-12-19 DIAGNOSIS — E785 Hyperlipidemia, unspecified: Secondary | ICD-10-CM | POA: Diagnosis not present

## 2017-12-19 DIAGNOSIS — I2510 Atherosclerotic heart disease of native coronary artery without angina pectoris: Secondary | ICD-10-CM | POA: Diagnosis not present

## 2017-12-19 DIAGNOSIS — I6523 Occlusion and stenosis of bilateral carotid arteries: Secondary | ICD-10-CM

## 2017-12-19 MED ORDER — PANTOPRAZOLE SODIUM 40 MG PO TBEC: 40.0000 mg | DELAYED_RELEASE_TABLET | Freq: Every day | ORAL | 11 refills | Status: DC

## 2017-12-19 MED ORDER — ISOSORBIDE MONONITRATE ER 60 MG PO TB24: 90.0000 mg | ORAL_TABLET | Freq: Every day | ORAL | 2 refills | Status: DC

## 2017-12-19 NOTE — Patient Instructions (Signed)
Medication Instructions:  Your physician has recommended you make the following change in your medication: START: protonix 40 mg (1 tablet) once day  INCREASE: Imdur to 90 (1.5 tablets) once a day   If you need a refill on your cardiac medications, please contact your pharmacy first.  Labwork: None ordered   Testing/Procedures: Your physician has requested that you have a carotid duplex. This test is an ultrasound of the carotid arteries in your neck. It looks at blood flow through these arteries that supply the brain with blood. Allow one hour for this exam. There are no restrictions or special instructions.  Follow-Up: Your physician recommends that you schedule a follow-up appointment in: 3-4 weeks with Estella Husk, PA  Your physician wants you to follow-up in: 1 year with Dr. Radford Pax. You will receive a reminder letter in the mail two months in advance. If you don't receive a letter, please call our office to schedule the follow-up appointment.  Any Other Special Instructions Will Be Listed Below (If Applicable).   Thank you for choosing [facility], RN  [phone]  If you need a refill on your cardiac medications before your next appointment, please call your pharmacy.

## 2017-12-19 NOTE — Progress Notes (Signed)
Cardiology Office Note:    Date:  12/19/2017   ID:  Eliah Ozawa, DOB 01-Nov-1955, MRN [id number]  PCP:  Alroy Dust, Carlean Jews.Marlou Sa, MD  Cardiologist:  Fransico Him, MD    Referring MD: Alroy Dust, Carlean Jews.Marlou Sa, MD   Chief Complaint  Patient presents with  . Coronary Artery Disease  . Hypertension  . Hyperlipidemia    History of Present Illness:    Carson Meche is a 62 y.o. 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   Stress test 05/2016 ischemia with follow-up cath patent LIMA to the LAD and free radial to OM with 30% in the RCA.  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  Last 2D echo 01/2017 normal LVEF no wall motion abnormalities.  She was seen in the hospital by Dr. Harl Bowie 10/30/17 for chest pain that was felt to be atypical worse with position and breathing and left chest wall was tender to palpation.  Troponins negative x3 other labs stable.  No acute EKG changes.  Was felt she had musculoskeletal chest pain and was given a one-time dose of Toradol and short course of NSAIDs.  Patient was back in the ER 11/09/17 with sharp shooting chest pains troponins negative and EKG unchanged.  Discharge without any further workup.  She was seen back again on 11/23/2016 by Amie Portland still complaining of ongoing chest pain that was sharp and shooting with significant shortness of breath with little exertion.  EKG was unchanged.  Troponin was negative and she underwent stress Myoview which showed no evidence of ischemia.    She is now back today for follow-up with her daughter 56for followup.  She has not had any further chest pain since she was placed on Imdur 60 mg daily..  She denies  LE edema, dizziness, palpitations or syncope. She does complain of having dyspnea on exertion when she goes to do any kind of exertional activity and her daughter thinks it  is because she is very deconditioned from sitting around the house and not doing any activities.  Her daughter says she has tried to encourage her to go to the gym and do water aerobics but the patient only says that she cannot because of her heart.  Past Medical History:  Diagnosis Date  . Carotid artery stenosis    a. Last carotid 07/2015 - 1-39% stenosis .  Marland Kitchen Coronary artery disease    a. s/p CABG 10/2012.  cath 05/2016 with moderate LM stenosis, mild LCx and RCA stenosis and occluded LAD with patent LIMA to LAD, free radial to OM.  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  Marland Kitchen Dyslipidemia   . Generalized convulsive epilepsy without mention of intractable epilepsy   . Hypertension   . Memory deficit   . Migraine    "controlled on daily RX " (06/11/2016)  . Nephrolithiasis 07/30/2015   S/p lithotripsy x 3 and right and left ureter stents  . OSA (obstructive sleep apnea)    hx; was on CPAP; haven't needed it since OHS" (06/11/2016)  . PONV (postoperative nausea and vomiting)   . Seizures ([place])    "controlled w/daily RX; started in 2012; dr thinks they might be from the migraines; last sz was early part of 2016" (06/11/2016)  . Type II diabetes mellitus ([place])    diet controlled (06/11/2016)  . Vasovagal syncope  07/30/2015   2011    Past Surgical History:  Procedure Laterality Date  . CARDIAC CATHETERIZATION  2014; 06/11/2016  . CARDIAC CATHETERIZATION N/A 06/11/2016   Procedure: Left Heart Cath and Cors/Grafts Angiography;  Surgeon: Sherren Mocha, MD;  Location: [facility];  Service: Cardiovascular;  Laterality: N/A;  . CESAREAN SECTION    . CORONARY ANGIOPLASTY    . CORONARY ARTERY BYPASS GRAFT  March, 2014   LIMA to LAD, left radial to LCx  . LITHOTRIPSY  X 3  . TOTAL ABDOMINAL HYSTERECTOMY    . TUBAL LIGATION    . URETERAL STENT PLACEMENT      Current Medications: Current Meds  Medication Sig  . acetaminophen (TYLENOL) 325 MG tablet Take 2 tablets (650 mg total)  by mouth every 4 (four) hours as needed for headache or mild pain.  Marland Kitchen ALPRAZolam (XANAX) 0.25 MG tablet TAKE 1 TABLET BY MOUTH 3 TIMES A DAY AS NEEDED FOR ANXIETY  . aspirin 81 MG tablet Take 81 mg by mouth daily.  Marland Kitchen atorvastatin (LIPITOR) 80 MG tablet Take 1 tablet (80 mg total) by mouth daily.  . clopidogrel (PLAVIX) 75 MG tablet TAKE ONE TABLET BY MOUTH DAILY  . escitalopram (LEXAPRO) 10 MG tablet Take 10 mg by mouth daily.  Marland Kitchen ezetimibe (ZETIA) 10 MG tablet TAKE 1 TABLET (10 MG TOTAL) BY MOUTH DAILY.  . furosemide (LASIX) 20 MG tablet Take 20 mg by mouth as needed for fluid.  . isosorbide mononitrate (IMDUR) 60 MG 24 hr tablet Take 1 tablet (60 mg total) by mouth daily.  Marland Kitchen levETIRAcetam (KEPPRA) 500 MG tablet TAKE 1 TABLET (500 MG TOTAL) BY MOUTH 2 (TWO) TIMES DAILY.  Marland Kitchen lisinopril (PRINIVIL,ZESTRIL) 20 MG tablet Take 1 tablet (20 mg total) by mouth daily.  . metoprolol succinate (TOPROL-XL) 25 MG 24 hr tablet TAKE 1 TABLET (25 MG TOTAL) BY MOUTH DAILY.  . nitroGLYCERIN (NITROSTAT) 0.4 MG SL tablet Place 1 tablet (0.4 mg total) under the tongue every 5 (five) minutes x 3 doses as needed for chest pain.  Marland Kitchen topiramate (TOPAMAX) 25 MG tablet Take 3 tablets (75 mg total) by mouth 2 (two) times daily.     Allergies:   Depakote [divalproex sodium]; Anesthesia s-i-60; and Penicillins   Social History   Socioeconomic History  . Marital status: Divorced    Spouse name: Not on file  . Number of children: 2  . Years of education: 30  . Highest education level: Not on file  Occupational History  . Occupation: retired  Scientific laboratory technician  . Financial resource strain: Not on file  . Food insecurity:    Worry: Not on file    Inability: Not on file  . Transportation needs:    Medical: Not on file    Non-medical: Not on file  Tobacco Use  . Smoking status: Current Some Day Smoker    Packs/day: 0.33    Years: 35.00    Pack years: 11.55    Types: Cigarettes    Last attempt to quit: 06/11/2016     Years since quitting: 1.5  . Smokeless tobacco: Former Systems developer    Quit date: 11/20/2012  Substance and Sexual Activity  . Alcohol use: Yes    Comment: 06/11/2016 "might have a few drinks/month; usually wine"  . Drug use: No  . Sexual activity: Not Currently  Lifestyle  . Physical activity:    Days per week: Not on file    Minutes per session: Not on file  .  Stress: Not on file  Relationships  . Social connections:    Talks on phone: Not on file    Gets together: Not on file    Attends religious service: Not on file    Active member of club or organization: Not on file    Attends meetings of clubs or organizations: Not on file    Relationship status: Not on file  Other Topics Concern  . Not on file  Social History Narrative   Patient drinks about 1 cup of coffee daily.   Patient is right handed.      Family History: The patient's family history includes Cancer in her father; Diabetes in her brother, brother, and father; Heart attack in her sister; Heart disease in her brother and sister; Stroke in her brother.  ROS:   Please see the history of present illness.    ROS  All other systems reviewed and negative.   EKGs/Labs/Other Studies Reviewed:    The following studies were reviewed today: Nuclear stress test  EKG:  EKG is not ordered today.   Recent Labs: 11/08/2017: BUN 10; Creatinine, Ser 0.98; Hemoglobin 11.9; Platelets 190; Potassium 3.8; Sodium 139   Recent Lipid Panel    Component Value Date/Time   CHOL 216 (H) 06/11/2016 0521   TRIG 86 06/11/2016 0521   HDL 48 06/11/2016 0521   CHOLHDL 4.5 06/11/2016 0521   VLDL 17 06/11/2016 0521   LDLCALC 151 (H) 06/11/2016 0521    Physical Exam:    VS:  BP 126/64   Pulse 68   Ht 5\' 1"  (1.549 m)   Wt 151 lb (68.5 kg)   BMI 28.53 kg/m      Wt Readings from Last 3 Encounters:  12/19/17 151 lb (68.5 kg)  11/29/17 149 lb (67.6 kg)  11/23/17 149 lb (67.6 kg)     GEN:  Well nourished, well developed in no acute  distress HEENT: Normal NECK: No JVD; No carotid bruits LYMPHATICS: No lymphadenopathy CARDIAC: RRR, no murmurs, rubs, gallops RESPIRATORY:  Clear to auscultation without rales, wheezing or rhonchi  ABDOMEN: Soft, non-tender, non-distended MUSCULOSKELETAL:  No edema; No deformity  SKIN: Warm and dry NEUROLOGIC:  Alert and oriented x 3 PSYCHIATRIC:  Normal affect   ASSESSMENT:    1. Atherosclerosis of native coronary artery of native heart, angina presence unspecified   2. Essential hypertension   3. Asymptomatic bilateral carotid artery stenosis   4. Dyslipidemia    PLAN:    In order of problems listed above:  1.  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   Stress test 05/2016 ischemia with follow-up cath patent LIMA to the LAD and free radial to OM with 30% in the RCA.  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  She is continued to have episodes of sharp stabbing chest pain despite a recent nuclear stress test that showed no discrete ischemia.  Sshe tells me that her chest pain is identical to what she had when she had her cath back in 2017 that looked good.  Her daughter says that she sits around a lot at home and I suspect she is deconditioned and that is why she gets some shortness of breath when she exerts herself.  I am suspicious that she could be potentially having some esophageal spasm as some of this occurs at night  when she is lying in bed.  I will start her on Protonix 40 mg daily and increase her Imdur to 90 mg daily.  She has not had any chest pain since she saw Estella Husk couple weeks ago.  She will continue with Plavix 75 mg daily, aspirin 81 mg daily, Toprol XL 25 mg daily, high-dose statin and increase to Imdur 90 mg daily.  She will see Sharyn Lull is back in 3 to 4 weeks.  I have told her to let us know she has any more chest  pain.  If she experiences further chest pain we will need to proceed with repeat heart cath.  May need to consider adding Ranexa.  2.  HTN -  BP is well controlled on exam today.  She will continue on lisinopril 20 mg daily and Toprol XL 25mg  daily.  3.  Asymptomatic carotid artery stenosis -carotid Dopplers in 2016 showed less than 50% stenosis in both the right and left carotid arteries.  I will recheck carotid Dopplers.  4.  Hyperlipidemia with LDL goal < 70.  She will continue on high-dose statin with Lipitor 80 mg daily and Zetia 10 mg daily.  Her last LDL was at goal a year ago. I will repeat an FLP and ALT.   Medication Adjustments/Labs and Tests Ordered: Current medicines are reviewed at length with the patient today.  Concerns regarding medicines are outlined above.  No orders of the defined types were placed in this encounter.  No orders of the defined types were placed in this encounter.   Signed, Fransico Him, MD  12/19/2017 2:40 PM    [place]

## 2017-12-26 ENCOUNTER — Ambulatory Visit (HOSPITAL_COMMUNITY)
Admission: RE | Admit: 2017-12-26 | Discharge: 2017-12-26 | Disposition: A | Discharge status: Home / Self Care | Payer: Medicare HMO | Source: Ambulatory Visit | Attending: Cardiovascular Disease | Admitting: Cardiovascular Disease

## 2017-12-26 DIAGNOSIS — I6523 Occlusion and stenosis of bilateral carotid arteries: Secondary | ICD-10-CM | POA: Diagnosis not present

## 2017-12-28 ENCOUNTER — Encounter: Payer: Self-pay | Admitting: Cardiology

## 2017-12-28 ENCOUNTER — Ambulatory Visit (HOSPITAL_BASED_OUTPATIENT_CLINIC_OR_DEPARTMENT_OTHER): Payer: Medicare HMO | Attending: Cardiology | Admitting: Cardiology

## 2017-12-28 DIAGNOSIS — G4733 Obstructive sleep apnea (adult) (pediatric): Secondary | ICD-10-CM

## 2017-12-29 ENCOUNTER — Encounter (HOSPITAL_BASED_OUTPATIENT_CLINIC_OR_DEPARTMENT_OTHER): Payer: Self-pay | Admitting: *Deleted

## 2018-01-04 NOTE — Procedures (Signed)
   Patient Name: Samantha Huffman, Samantha Huffman Date: 12/28/2017 Gender: Female D.O.B: Jun 20, 1956 Age (years): 46 Referring Provider: Fransico Him MD, ABSM Height (inches): 61 Interpreting Physician: Fransico Him MD, ABSM Weight (lbs): 151 RPSGT: [place], Holly BMI: 29 MRN: [id number] Neck Size: 14.00  CLINICAL INFORMATION Sleep Study Type: HST  Indication for sleep study: OSA  Epworth Sleepiness Score: 5  SLEEP STUDY TECHNIQUE A multi-channel overnight portable sleep study was performed. The channels recorded were: nasal airflow, thoracic respiratory movement, and oxygen saturation with a pulse oximetry. Snoring was also monitored.  MEDICATIONS Patient self administered medications include: N/A.  SLEEP ARCHITECTURE Patient was studied for 399.4 minutes. The sleep efficiency was 92.2 % and the patient was supine for 99.8%. The arousal index was 0.0 per hour.  RESPIRATORY PARAMETERS The overall AHI was 5.7 per hour, with a central apnea index of 0.0 per hour.  The oxygen nadir was 87% during sleep.  CARDIAC DATA Mean heart rate during sleep was 72.2 bpm.  IMPRESSIONS - Mild obstructive sleep apnea occurred during this study (AHI = 5.7/h). - No significant central sleep apnea occurred during this study (CAI = 0.0/h). - Mild oxygen desaturation was noted during this study (Min O2 = 87%). - Patient snored 42.7% during the sleep.  DIAGNOSIS - Obstructive Sleep Apnea (327.23 [G47.33 ICD-10])  RECOMMENDATIONS - Consider ENT consult for surgical causes of snoring.  - Oral appliance may be considered. - Avoid alcohol, sedatives and other CNS depressants that may worsen sleep apnea and disrupt normal sleep architecture. - Sleep hygiene should be reviewed to assess factors that may improve sleep quality. - Weight management and regular exercise should be initiated or continued. - Return to Sleep Center to discuss the results of this study - Patient may benefit from in-lab  study  [Electronically signed] 01/04/2018 11:08 PM  Fransico Him MD, ABSM Diplomate, American Board of Sleep Medicine

## 2018-01-05 ENCOUNTER — Telehealth: Payer: Self-pay | Admitting: *Deleted

## 2018-01-05 DIAGNOSIS — G4733 Obstructive sleep apnea (adult) (pediatric): Secondary | ICD-10-CM

## 2018-01-05 DIAGNOSIS — R0683 Snoring: Secondary | ICD-10-CM

## 2018-01-05 NOTE — Telephone Encounter (Signed)
Informed patient of sleep study results and patient understanding was verbalized. Patient understands her sleep study showed she had minimal OSA. Refer to ENT to see if there are surgical causes to snoring and mild OSA. Avoid sleeping supine. With low AHI and no significant daytime sleepiness or hypoxia would try to avoid supine sleep. No CPAP indicated at this time.  Pt is aware and agreeable to results.  Per patient request called patients daughter [name] (per dpr) Pt is aware and agreeable to results and recommendations.

## 2018-01-05 NOTE — Telephone Encounter (Signed)
-----   Message from Sueanne Margarita, MD sent at 01/04/2018 11:12 PM EDT ----- Please let patient know that she had minimal OSA.  Refer to ENT to see if there are surgical causes to snoring and mild OSA.  Avoid sleeping supine.  With low AHI and no significant daytime sleepiness or hypoxia would try to avoid supine sleep.  No CPAP indicated at this time.

## 2018-01-17 NOTE — Progress Notes (Signed)
Cardiology Office Note    Date:  01/18/2018   ID:  Samantha Huffman, DOB 23-Aug-1956, MRN [id number]  PCP:  Alroy Dust, Carlean Jews.Marlou Sa, MD  Cardiologist: Fransico Him, MD  Chief Complaint  Patient presents with  . Follow-up    History of Present Illness:  Samantha Huffman is a 62 y.o. female with history of CAD status post CABG x2 in 2014 with LIMA to the LAD, free radial to OM, felt to have microvascular disease.  Stress test in 2017 with ischemia.  Follow-up cath showed patent LIMA to the LAD and free radial to the OM with 30% in the RCA.  She was felt to have microvascular disease and Imdur added.  2D echo in 01/2017 normal LVEF.  Patient also has hypertension, HLD, DM type II, OSA and carotid disease.  I saw the patient 11/23/2016 for atypical chest pain sharp shooting and significant shortness of breath with exertion.  She had had 2 emergency room visits for the same and troponins were all negative.  Exercise Myoview showed no ischemia.  Added Imdur the office visit 11/23/2017.  Patient saw Dr. Radford Pax 12/19/2017 and had no further chest pain since Imdur was added.  [name] says she is very inactive and felt most of the symptoms worse due to deconditioning.  Protonix was added for possible esophageal spasm.  Imdur was increased to 90 mg daily.  She also had a recent sleep study that showed mild obstructive sleep apnea and no significant central sleep apnea.  Referred to ENT because of snoring.  Patient comes in today for follow-up.  Her blood pressure is elevated.  She just took her medications before she came here.  Got extra salt in her diet this past weekend and feet were swollen. She took some Lasix. Weight up to 153.  Complaining about weight gain.  Not exercising regularly.  Complains of joint pain in her hip and left elbow when sleeping or walking around.  Wonder if it is coming from her cholesterol medicine.  Does not want take any more pills.  No further chest pain.  Past Medical History:    Diagnosis Date  . Carotid artery stenosis    1-39% bilateral stenosis by dopplers 12/2017  . Coronary artery disease    a. s/p CABG 10/2012.  cath 05/2016 with moderate LM stenosis, mild LCx and RCA stenosis and occluded LAD with patent LIMA to LAD, free radial to OM.  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  Marland Kitchen Dyslipidemia   . Generalized convulsive epilepsy without mention of intractable epilepsy   . Hypertension   . Memory deficit   . Migraine    "controlled on daily RX " (06/11/2016)  . Nephrolithiasis 07/30/2015   S/p lithotripsy x 3 and right and left ureter stents  . OSA (obstructive sleep apnea)    hx; was on CPAP; haven't needed it since [facility]" (06/11/2016)  . PONV (postoperative nausea and vomiting)   . Seizures ([place])    "controlled w/daily RX; started in 2012; dr thinks they might be from the migraines; last sz was early part of 2016" (06/11/2016)  . Type II diabetes mellitus ([place])    diet controlled (06/11/2016)  . Vasovagal syncope 07/30/2015   2011    Past Surgical History:  Procedure Laterality Date  . CARDIAC CATHETERIZATION  2014; 06/11/2016  . CARDIAC CATHETERIZATION N/A 06/11/2016   Procedure: Left Heart Cath and Cors/Grafts Angiography;  Surgeon: Sherren Mocha, MD;  Location: [facility];  Service: Cardiovascular;  Laterality:  N/A;  . CESAREAN SECTION    . CORONARY ANGIOPLASTY    . CORONARY ARTERY BYPASS GRAFT  March, 2014   LIMA to LAD, left radial to LCx  . LITHOTRIPSY  X 3  . TOTAL ABDOMINAL HYSTERECTOMY    . TUBAL LIGATION    . URETERAL STENT PLACEMENT      Current Medications: Current Meds  Medication Sig  . acetaminophen (TYLENOL) 325 MG tablet Take 2 tablets (650 mg total) by mouth every 4 (four) hours as needed for headache or mild pain.  Marland Kitchen ALPRAZolam (XANAX) 0.25 MG tablet TAKE 1 TABLET BY MOUTH 3 TIMES A DAY AS NEEDED FOR ANXIETY  . aspirin 81 MG tablet Take 81 mg by mouth daily.  Marland Kitchen atorvastatin (LIPITOR) 80 MG tablet Take 1 tablet (80  mg total) by mouth daily.  . clopidogrel (PLAVIX) 75 MG tablet TAKE ONE TABLET BY MOUTH DAILY  . escitalopram (LEXAPRO) 10 MG tablet Take 10 mg by mouth daily.  Marland Kitchen ezetimibe (ZETIA) 10 MG tablet TAKE 1 TABLET (10 MG TOTAL) BY MOUTH DAILY.  . furosemide (LASIX) 20 MG tablet Take 20 mg by mouth as needed for fluid.  . isosorbide mononitrate (IMDUR) 60 MG 24 hr tablet Take 1.5 tablets (90 mg total) by mouth daily.  Marland Kitchen levETIRAcetam (KEPPRA) 500 MG tablet TAKE 1 TABLET (500 MG TOTAL) BY MOUTH 2 (TWO) TIMES DAILY.  Marland Kitchen lisinopril (PRINIVIL,ZESTRIL) 20 MG tablet Take 1 tablet (20 mg total) by mouth daily.  . metoprolol succinate (TOPROL-XL) 25 MG 24 hr tablet TAKE 1 TABLET (25 MG TOTAL) BY MOUTH DAILY.  . nitroGLYCERIN (NITROSTAT) 0.4 MG SL tablet Place 1 tablet (0.4 mg total) under the tongue every 5 (five) minutes x 3 doses as needed for chest pain.  . pantoprazole (PROTONIX) 40 MG tablet Take 1 tablet (40 mg total) by mouth daily.  Marland Kitchen topiramate (TOPAMAX) 25 MG tablet Take 3 tablets (75 mg total) by mouth 2 (two) times daily.     Allergies:   Depakote [divalproex sodium]; Anesthesia s-i-60; and Penicillins   Social History   Socioeconomic History  . Marital status: Divorced    Spouse name: Not on file  . Number of children: 2  . Years of education: 24  . Highest education level: Not on file  Occupational History  . Occupation: retired  Scientific laboratory technician  . Financial resource strain: Not on file  . Food insecurity:    Worry: Not on file    Inability: Not on file  . Transportation needs:    Medical: Not on file    Non-medical: Not on file  Tobacco Use  . Smoking status: Current Some Day Smoker    Packs/day: 0.33    Years: 35.00    Pack years: 11.55    Types: Cigarettes    Last attempt to quit: 06/11/2016    Years since quitting: 1.6  . Smokeless tobacco: Former Systems developer    Quit date: 11/20/2012  Substance and Sexual Activity  . Alcohol use: Yes    Comment: 06/11/2016 "might have a few  drinks/month; usually wine"  . Drug use: No  . Sexual activity: Not Currently  Lifestyle  . Physical activity:    Days per week: Not on file    Minutes per session: Not on file  . Stress: Not on file  Relationships  . Social connections:    Talks on phone: Not on file    Gets together: Not on file    Attends religious service: Not on  file    Active member of club or organization: Not on file    Attends meetings of clubs or organizations: Not on file    Relationship status: Not on file  Other Topics Concern  . Not on file  Social History Narrative   Patient drinks about 1 cup of coffee daily.   Patient is right handed.      Family History:  The patient's family history includes Cancer in her father; Diabetes in her brother, brother, and father; Heart attack in her sister; Heart disease in her brother and sister; Stroke in her brother.   ROS:   Please see the history of present illness.    Review of Systems  Constitution: Positive for weight gain.  HENT: Negative.   Eyes: Negative.   Cardiovascular: Positive for leg swelling.  Respiratory: Negative.   Hematologic/Lymphatic: Negative.   Musculoskeletal: Positive for joint pain.  Gastrointestinal: Negative.   Genitourinary: Negative.   Neurological: Negative.    All other systems reviewed and are negative.   PHYSICAL EXAM:   VS:  BP (!) 153/90   Pulse 99   Ht 5\' 1"  (1.549 m)   Wt 153 lb 6.4 oz (69.6 kg)   SpO2 99%   BMI 28.98 kg/m   Physical Exam  GEN: Well nourished, well developed, in no acute distress  Neck: no JVD, carotid bruits, or masses Cardiac:RRR; no murmurs, rubs, or gallops  Respiratory:  clear to auscultation bilaterally, normal work of breathing GI: soft, nontender, nondistended, + BS Ext: without cyanosis, clubbing, or edema, Good distal pulses bilaterally Neuro:  Alert and Oriented x 3 Psych: euthymic mood, full affect  Wt Readings from Last 3 Encounters:  01/18/18 153 lb 6.4 oz (69.6 kg)    12/19/17 151 lb (68.5 kg)  11/29/17 149 lb (67.6 kg)      Studies/Labs Reviewed:   EKG:  EKG is not ordered today.   Recent Labs: 11/08/2017: BUN 10; Creatinine, Ser 0.98; Hemoglobin 11.9; Platelets 190; Potassium 3.8; Sodium 139   Lipid Panel    Component Value Date/Time   CHOL 216 (H) 06/11/2016 0521   TRIG 86 06/11/2016 0521   HDL 48 06/11/2016 0521   CHOLHDL 4.5 06/11/2016 0521   VLDL 17 06/11/2016 0521   LDLCALC 151 (H) 06/11/2016 0521    Additional studies/ records that were reviewed today include:  Nuclear stress test 11/29/2017  study Highlights    The left ventricular ejection fraction is normal (55-65%).  Nuclear stress EF: 57%.  There was less than 67mm of horizontal ST segment depression during infusion.  Myocardial perfusion images are normal.  This is a low risk study.   Echo: 6/18   Study Conclusions   - Left ventricle: The cavity size was normal. There was mild focal   basal hypertrophy of the septum. Systolic function was normal.   The estimated ejection fraction was in the range of 60% to 65%.   Wall motion was normal; there were no regional wall motion   abnormalities. Left ventricular diastolic function parameters   were normal. - Aortic valve: Trileaflet; mildly thickened, mildly calcified   leaflets. - Mitral valve: There was mild regurgitation.         ASSESSMENT:    1. Atherosclerosis of native coronary artery of native heart without angina pectoris   2. Essential hypertension   3. Dyslipidemia      PLAN:  In order of problems listed above:   CAD status post CABG x2 in 2014 with LIMA  to the LAD and free radial to OM.  Follow-up cath in 2017 patent LIMA to the LAD and free radial to OM with 30% RCA felt to have microvascular disease.  Recent chest pain with 2 ER visits and troponins negative.  Lexiscan Myoview 11/29/2017 without ischemia.  Symptoms improved with increasing Imdur to 90 mg daily.  Essential hypertension blood  pressure elevated when she got here but repeat blood pressure 140/85-just took her medications right before she got here.  Does not want to take any more medication.  Recommend 2 g sodium diet.  Her daughter has a blood pressure cuff and will keep track of her blood pressures.  Patient is to call us if she has consistent blood pressures greater than 135/85.  Follow-up with Dr. Radford Pax in 2 to 3 months.   OSA had lost her CPAP machine so repeat study done- has mild OSA.  Referred to ENT because of snoring  Hyperlipidemia LDL 11/2016 63-needs repeat fasting lipid panel.  Patient to have it done at [facility] lab because her insurance will cover it there.   Medication Adjustments/Labs and Tests Ordered: Current medicines are reviewed at length with the patient today.  Concerns regarding medicines are outlined above.  Medication changes, Labs and Tests ordered today are listed in the Patient Instructions below. There are no Patient Instructions on file for this visit.   Sumner Boast, PA-C  01/18/2018 9:04 AM    [facility] [address], [place], [place]  [address] Phone: [phone]; Fax: [phone]

## 2018-01-18 ENCOUNTER — Ambulatory Visit: Payer: Medicare HMO | Admitting: Physician Assistant

## 2018-01-18 ENCOUNTER — Encounter: Payer: Self-pay | Admitting: Physician Assistant

## 2018-01-18 VITALS — BP 153/90 | HR 99 | Ht 61.0 in | Wt 153.4 lb

## 2018-01-18 DIAGNOSIS — I10 Essential (primary) hypertension: Secondary | ICD-10-CM

## 2018-01-18 DIAGNOSIS — I2510 Atherosclerotic heart disease of native coronary artery without angina pectoris: Secondary | ICD-10-CM

## 2018-01-18 DIAGNOSIS — E785 Hyperlipidemia, unspecified: Secondary | ICD-10-CM | POA: Diagnosis not present

## 2018-01-18 NOTE — Patient Instructions (Signed)
Medication Instructions:  Your physician recommends that you continue on your current medications as directed. Please refer to the Current Medication list given to you today.   Labwork: Patient will get lipid/lft at [facility] printed to and has been given to  Patient.   Testing/Procedures: -None  Follow-Up: Your physician recommends that you keep your scheduled follow-up appointment with Dr.Turner.    Any Other Special Instructions Will Be Listed Below (If Applicable).     If you need a refill on your cardiac medications before your next appointment, please call your pharmacy.  If BP consistently stays above 135/85 please call Dr.Turner's nurse, [place] @ [phone].  Weight Watchers is encouraged.   Referral is in system, should be hearing from someone to schedule appointment.    Two Gram Sodium Diet 2000 mg  What is Sodium? Sodium is a mineral found naturally in many foods. The most significant source of sodium in the diet is table salt, which is about 40% sodium.  Processed, convenience, and preserved foods also contain a large amount of sodium.  The body needs only 500 mg of sodium daily to function,  A normal diet provides more than enough sodium even if you do not use salt.  Why Limit Sodium? A build up of sodium in the body can cause thirst, increased blood pressure, shortness of breath, and water retention.  Decreasing sodium in the diet can reduce edema and risk of heart attack or stroke associated with high blood pressure.  Keep in mind that there are many other factors involved in these health problems.  Heredity, obesity, lack of exercise, cigarette smoking, stress and what you eat all play a role.  General Guidelines:  Do not add salt at the table or in cooking.  One teaspoon of salt contains over 2 grams of sodium.  Read food labels  Avoid processed and convenience foods  Ask your dietitian before eating any foods not dicussed in the menu planning  guidelines  Consult your physician if you wish to use a salt substitute or a sodium containing medication such as antacids.  Limit milk and milk products to 16 oz (2 cups) per day.  Shopping Hints:  READ LABELS!! "Dietetic" does not necessarily mean low sodium.  Salt and other sodium ingredients are often added to foods during processing.   Menu Planning Guidelines Food Group Choose More Often Avoid  Beverages (see also the milk group All fruit juices, low-sodium, salt-free vegetables juices, low-sodium carbonated beverages Regular vegetable or tomato juices, commercially softened water used for drinking or cooking  Breads and Cereals Enriched white, wheat, rye and pumpernickel bread, hard rolls and dinner rolls; muffins, cornbread and waffles; most dry cereals, cooked cereal without added salt; unsalted crackers and breadsticks; low sodium or homemade bread crumbs Bread, rolls and crackers with salted tops; quick breads; instant hot cereals; pancakes; commercial bread stuffing; self-rising flower and biscuit mixes; regular bread crumbs or cracker crumbs  Desserts and Sweets Desserts and sweets mad with mild should be within allowance Instant pudding mixes and cake mixes  Fats Butter or margarine; vegetable oils; unsalted salad dressings, regular salad dressings limited to 1 Tbs; light, sour and heavy cream Regular salad dressings containing bacon fat, bacon bits, and salt pork; snack dips made with instant soup mixes or processed cheese; salted nuts  Fruits Most fresh, frozen and canned fruits Fruits processed with salt or sodium-containing ingredient (some dried fruits are processed with sodium sulfites        Vegetables  Fresh, frozen vegetables and low- sodium canned vegetables Regular canned vegetables, sauerkraut, pickled vegetables, and others prepared in brine; frozen vegetables in sauces; vegetables seasoned with ham, bacon or salt pork  Condiments, Sauces, Miscellaneous  Salt  substitute with physician's approval; pepper, herbs, spices; vinegar, lemon or lime juice; hot pepper sauce; garlic powder, onion powder, low sodium soy sauce (1 Tbs.); low sodium condiments (ketchup, chili sauce, mustard) in limited amounts (1 tsp.) fresh ground horseradish; unsalted tortilla chips, pretzels, potato chips, popcorn, salsa (1/4 cup) Any seasoning made with salt including garlic salt, celery salt, onion salt, and seasoned salt; sea salt, rock salt, kosher salt; meat tenderizers; monosodium glutamate; mustard, regular soy sauce, barbecue, sauce, chili sauce, teriyaki sauce, steak sauce, Worcestershire sauce, and most flavored vinegars; canned gravy and mixes; regular condiments; salted snack foods, olives, picles, relish, horseradish sauce, catsup   Food preparation: Try these seasonings Meats:    Pork Sage, onion Serve with applesauce  Chicken Poultry seasoning, thyme, parsley Serve with cranberry sauce  Lamb Curry powder, rosemary, garlic, thyme Serve with mint sauce or jelly  Veal Marjoram, basil Serve with current jelly, cranberry sauce  Beef Pepper, bay leaf Serve with dry mustard, unsalted chive butter  Fish Bay leaf, dill Serve with unsalted lemon butter, unsalted parsley butter  Vegetables:    Asparagus Lemon juice   Broccoli Lemon juice   Carrots Mustard dressing parsley, mint, nutmeg, glazed with unsalted butter and sugar   Green beans Marjoram, lemon juice, nutmeg,dill seed   Tomatoes Basil, marjoram, onion   Spice /blend for [employer]" 4 tsp ground thyme 1 tsp ground sage 3 tsp ground rosemary 4 tsp ground marjoram   Test your knowledge 1. A product that says "Salt Free" may still contain sodium. True or False 2. Garlic Powder and Hot Pepper Sauce an be used as alternative seasonings.True or False 3. Processed foods have more sodium than fresh foods.  True or False 4. Canned Vegetables have less sodium than froze True or False  WAYS TO DECREASE YOUR SODIUM  INTAKE 1. Avoid the use of added salt in cooking and at the table.  Table salt (and other prepared seasonings which contain salt) is probably one of the greatest sources of sodium in the diet.  Unsalted foods can gain flavor from the sweet, sour, and butter taste sensations of herbs and spices.  Instead of using salt for seasoning, try the following seasonings with the foods listed.  Remember: how you use them to enhance natural food flavors is limited only by your creativity... Allspice-Meat, fish, eggs, fruit, peas, red and yellow vegetables Almond Extract-Fruit baked goods Anise Seed-Sweet breads, fruit, carrots, beets, cottage cheese, cookies (tastes like licorice) Basil-Meat, fish, eggs, vegetables, rice, vegetables salads, soups, sauces Bay Leaf-Meat, fish, stews, poultry Burnet-Salad, vegetables (cucumber-like flavor) Caraway Seed-Bread, cookies, cottage cheese, meat, vegetables, cheese, rice Cardamon-Baked goods, fruit, soups Celery Powder or seed-Salads, salad dressings, sauces, meatloaf, soup, bread.Do not use  celery salt Chervil-Meats, salads, fish, eggs, vegetables, cottage cheese (parsley-like flavor) Chili Power-Meatloaf, chicken cheese, corn, eggplant, egg dishes Chives-Salads cottage cheese, egg dishes, soups, vegetables, sauces Cilantro-Salsa, casseroles Cinnamon-Baked goods, fruit, pork, lamb, chicken, carrots Cloves-Fruit, baked goods, fish, pot roast, green beans, beets, carrots Coriander-Pastry, cookies, meat, salads, cheese (lemon-orange flavor) Cumin-Meatloaf, fish,cheese, eggs, cabbage,fruit pie (caraway flavor) [employer], fruit, eggs, fish, poultry, cottage cheese, vegetables Dill Seed-Meat, cottage cheese, poultry, vegetables, fish, salads, bread Fennel Seed-Bread, cookies, apples, pork, eggs, fish, beets, cabbage, cheese, Licorice-like flavor Garlic-(buds or powder) Salads,  meat, poultry, fish, bread, butter, vegetables, potatoes.Do not  use garlic  salt Ginger-Fruit, vegetables, baked goods, meat, fish, poultry Horseradish Root-Meet, vegetables, butter Lemon Juice or Extract-Vegetables, fruit, tea, baked goods, fish salads Mace-Baked goods fruit, vegetables, fish, poultry (taste like nutmeg) Maple Extract-Syrups Marjoram-Meat, chicken, fish, vegetables, breads, green salads (taste like Sage) Mint-Tea, lamb, sherbet, vegetables, desserts, carrots, cabbage Mustard, Dry or Seed-Cheese, eggs, meats, vegetables, poultry Nutmeg-Baked goods, fruit, chicken, eggs, vegetables, desserts Onion Powder-Meat, fish, poultry, vegetables, cheese, eggs, bread, rice salads (Do not use   Onion salt) Orange Extract-Desserts, baked goods Oregano-Pasta, eggs, cheese, onions, pork, lamb, fish, chicken, vegetables, green salads Paprika-Meat, fish, poultry, eggs, cheese, vegetables Parsley Flakes-Butter, vegetables, meat fish, poultry, eggs, bread, salads (certain forms may   Contain sodium Pepper-Meat fish, poultry, vegetables, eggs Peppermint Extract-Desserts, baked goods Poppy Seed-Eggs, bread, cheese, fruit dressings, baked goods, noodles, vegetables, cottage  [employer], poultry, meat, fish, cauliflower, turnips,eggs bread Saffron-Rice, bread, veal, chicken, fish, eggs Sage-Meat, fish, poultry, onions, eggplant, tomateos, pork, stews Savory-Eggs, salads, poultry, meat, rice, vegetables, soups, pork Tarragon-Meat, poultry, fish, eggs, butter, vegetables (licorice-like flavor)  Thyme-Meat, poultry, fish, eggs, vegetables, (clover-like flavor), sauces, soups Tumeric-Salads, butter, eggs, fish, rice, vegetables (saffron-like flavor) Vanilla Extract-Baked goods, candy Vinegar-Salads, vegetables, meat marinades Walnut Extract-baked goods, candy  2. Choose your Foods Wisely   The following is a list of foods to avoid which are high in sodium:  Meats-Avoid all smoked, canned, salt cured, dried and kosher meat and  fish as well as Anchovies   [employer] meats:Bologna, Liverwurst, Pastrami Canned meat or fish  Marinated herring Caviar    Pepperoni Corned Beef   Pizza Dried chipped beef  Salami Frozen breaded fish or meat Salt pork Frankfurters or hot dogs  Sardines Gefilte fish   Sausage Ham (boiled ham, Proscuitto Smoked butt    spiced ham)   Spam      TV Dinners Vegetables Canned vegetables (Regular) Relish Canned mushrooms  Sauerkraut Olives    Tomato juice Pickles  Bakery and Dessert Products Canned puddings  Cream pies Cheesecake   Decorated cakes Cookies  Beverages/Juices Tomato juice, regular  Gatorade   V-8 vegetable juice, regular  Breads and Cereals Biscuit mixes   Salted potato chips, corn chips, pretzels Bread stuffing mixes  Salted crackers and rolls Pancake and waffle mixes Self-rising flour  Seasonings Accent    Meat sauces Barbecue sauce  Meat tenderizer Catsup    Monosodium glutamate (MSG) Celery salt   Onion salt Chili sauce   Prepared mustard Garlic salt   Salt, seasoned salt, sea salt Gravy mixes   Soy sauce Horseradish   Steak sauce Ketchup   Tartar sauce Lite salt    Teriyaki sauce Marinade mixes   Worcestershire sauce  Others Baking powder   Cocoa and cocoa mixes Baking soda   Commercial casserole mixes Candy-caramels, chocolate  Dehydrated soups    Bars, fudge,nougats  Instant rice and pasta mixes Canned broth or soup  Maraschino cherries Cheese, aged and processed cheese and cheese spreads  Learning Assessment Quiz  Indicated T (for True) or F (for False) for each of the following statements:  1. _____ Fresh fruits and vegetables and unprocessed grains are generally low in sodium 2. _____ Water may contain a considerable amount of sodium, depending on the source 3. _____ You can always tell if a food is high in sodium by tasting it 4. _____ Certain laxatives my be high in sodium and should  be avoided unless prescribed   by a  physician or pharmacist 5. _____ Salt substitutes may be used freely by anyone on a sodium restricted diet 6. _____ Sodium is present in table salt, food additives and as a natural component of   most foods 7. _____ Table salt is approximately 90% sodium 8. _____ Limiting sodium intake may help prevent excess fluid accumulation in the body 9. _____ On a sodium-restricted diet, seasonings such as bouillon soy sauce, and    cooking wine should be used in place of table salt 10. _____ On an ingredient list, a product which lists monosodium glutamate as the first   ingredient is an appropriate food to include on a low sodium diet  Circle the best answer(s) to the following statements (Hint: there may be more than one correct answer)  11. On a low-sodium diet, some acceptable snack items are:    A. Olives  F. Bean dip   K. Grapefruit juice    B. Salted Pretzels G. Commercial Popcorn   L. Canned peaches    C. Carrot Sticks  H. Bouillon   M. Unsalted nuts   D. [place] fries  I. Peanut butter crackers N. Salami   E. Sweet pickles J. Tomato Juice   O. Pizza  12.  Seasonings that may be used freely on a reduced - sodium diet include   A. Lemon wedges F.Monosodium glutamate K. Celery seed    B.Soysauce   G. Pepper   L. Mustard powder   C. Sea salt  H. Cooking wine  M. Onion flakes   D. Vinegar  E. Prepared horseradish N. Salsa   E. Sage   J. Worcestershire sauce  O. Chutney

## 2018-01-19 DIAGNOSIS — I10 Essential (primary) hypertension: Secondary | ICD-10-CM | POA: Diagnosis not present

## 2018-01-19 DIAGNOSIS — E785 Hyperlipidemia, unspecified: Secondary | ICD-10-CM | POA: Diagnosis not present

## 2018-01-19 DIAGNOSIS — I2510 Atherosclerotic heart disease of native coronary artery without angina pectoris: Secondary | ICD-10-CM | POA: Diagnosis not present

## 2018-01-20 LAB — HEPATIC FUNCTION PANEL
AG Ratio: 1.6 (calc) (ref 1.0–2.5)
ALKALINE PHOSPHATASE (APISO): 134 U/L — AB (ref 33–130)
ALT: 25 U/L (ref 6–29)
AST: 20 U/L (ref 10–35)
Albumin: 4.5 g/dL (ref 3.6–5.1)
Bilirubin, Direct: 0.1 mg/dL (ref 0.0–0.2)
Globulin: 2.9 g/dL (calc) (ref 1.9–3.7)
Indirect Bilirubin: 0.4 mg/dL (calc) (ref 0.2–1.2)
TOTAL PROTEIN: 7.4 g/dL (ref 6.1–8.1)
Total Bilirubin: 0.5 mg/dL (ref 0.2–1.2)

## 2018-01-20 LAB — LIPID PANEL
Cholesterol: 174 mg/dL (ref <200)
HDL: 49 mg/dL — ABNORMAL LOW (ref >50)
LDL CHOLESTEROL (CALC): 103 mg/dL — AB
NON-HDL CHOLESTEROL (CALC): 125 mg/dL (ref <130)
Total CHOL/HDL Ratio: 3.6 (calc) (ref <5.0)
Triglycerides: 127 mg/dL (ref <150)

## 2018-01-24 ENCOUNTER — Other Ambulatory Visit: Payer: Self-pay | Admitting: Physician Assistant

## 2018-01-24 DIAGNOSIS — I10 Essential (primary) hypertension: Secondary | ICD-10-CM

## 2018-01-24 MED ORDER — LISINOPRIL 20 MG PO TABS: 20.0000 mg | ORAL_TABLET | Freq: Every day | ORAL | 11 refills | Status: DC

## 2018-02-22 ENCOUNTER — Other Ambulatory Visit: Payer: Self-pay | Admitting: Cardiology

## 2018-03-09 DIAGNOSIS — J3089 Other allergic rhinitis: Secondary | ICD-10-CM | POA: Insufficient documentation

## 2018-03-09 DIAGNOSIS — G4733 Obstructive sleep apnea (adult) (pediatric): Secondary | ICD-10-CM | POA: Diagnosis not present

## 2018-03-23 ENCOUNTER — Telehealth: Payer: Self-pay | Admitting: Cardiology

## 2018-03-23 NOTE — Telephone Encounter (Signed)
Spoke to patient and she will discuss the sleep study results/locations with Dr Radford Pax at her next OV.

## 2018-03-23 NOTE — Telephone Encounter (Signed)
New Problem   Pt need to speak to nurse concerning a test Dr Radford Pax wanted her to go to and have done at [facility]. Pt hasn't been and want to know if she still need to come in. Please advise pt.

## 2018-04-04 ENCOUNTER — Telehealth: Payer: Self-pay | Admitting: *Deleted

## 2018-04-04 ENCOUNTER — Ambulatory Visit: Payer: Medicare HMO | Admitting: Cardiology

## 2018-04-04 ENCOUNTER — Telehealth: Payer: Self-pay

## 2018-04-04 ENCOUNTER — Encounter: Payer: Self-pay | Admitting: Cardiology

## 2018-04-04 VITALS — BP 152/68 | HR 88 | Ht 61.0 in | Wt 151.0 lb

## 2018-04-04 DIAGNOSIS — I25118 Atherosclerotic heart disease of native coronary artery with other forms of angina pectoris: Secondary | ICD-10-CM | POA: Diagnosis not present

## 2018-04-04 DIAGNOSIS — I6523 Occlusion and stenosis of bilateral carotid arteries: Secondary | ICD-10-CM | POA: Diagnosis not present

## 2018-04-04 DIAGNOSIS — G4733 Obstructive sleep apnea (adult) (pediatric): Secondary | ICD-10-CM | POA: Diagnosis not present

## 2018-04-04 DIAGNOSIS — I10 Essential (primary) hypertension: Secondary | ICD-10-CM

## 2018-04-04 DIAGNOSIS — E785 Hyperlipidemia, unspecified: Secondary | ICD-10-CM

## 2018-04-04 MED ORDER — METOPROLOL SUCCINATE ER 50 MG PO TB24: 50.0000 mg | ORAL_TABLET | Freq: Every day | ORAL | 3 refills | Status: DC

## 2018-04-04 NOTE — Patient Instructions (Signed)
Medication Instructions:  Increase Toprol to 50 mg daily, by mouth  Testing/Procedures: Your physician has recommended that you have a sleep study. This test records several body functions during sleep, including: brain activity, eye movement, oxygen and carbon dioxide blood levels, heart rate and rhythm, breathing rate and rhythm, the flow of air through your mouth and nose, snoring, body muscle movements, and chest and belly movement.  Follow-Up: Your physician wants you to follow-up in: 6 months with Estella Husk, PA. You will receive a reminder letter in the mail two months in advance. If you don't receive a letter, please call our office to schedule the follow-up appointment.  Your physician wants you to follow-up in: 1 year with Dr. Radford Pax. You will receive a reminder letter in the mail two months in advance. If you don't receive a letter, please call our office to schedule the follow-up appointment.  We will speak with our Pharmacist to determine if eligible for Lipid Clinic.  Any Other Special Instructions Will Be Listed Below (If Applicable).     If you need a refill on your cardiac medications before your next appointment, please call your pharmacy.

## 2018-04-04 NOTE — Telephone Encounter (Signed)
Sent to precert 

## 2018-04-04 NOTE — Progress Notes (Signed)
Cardiology Office Note:    Date:  04/04/2018   ID:  Samantha Huffman, DOB May 17, 1956, MRN [id number]  PCP:  Alroy Dust, Carlean Jews.Marlou Sa, MD  Cardiologist:  Fransico Him, MD    Referring MD: Alroy Dust, Carlean Jews.Marlou Sa, MD   Chief Complaint  Patient presents with  . Coronary Artery Disease  . Hypertension  . Hyperlipidemia  . Sleep Apnea    History of Present Illness:    Samantha Huffman is a 62 y.o. 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 Stress test 05/2016 ischemia with follow-up cath patent LIMA to the LAD and free radial to OM with 30% in the RCA. 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 Last 2D echo 01/2017 normal LVEF no wall motion abnormalities.  Due to ongoing sharp CP she underwent stress Myoview which showed no evidence of ischemia.  She  was complaining of DOE and was felt to be due to deconditioning.  She was seen by Estella Husk, PA in May and BP was elevated due to dietary indiscretion with sodium.  She did not want to change her medications and agreed to try to follow low sodium diet.   She is here today for followup and is doing well.  She denies any chest pain or pressure, SOB, DOE, PND, orthopnea, LE edema, dizziness, palpitations or syncope. She is compliant with her meds and is tolerating meds with no SE. she has a history of obstructive sleep apnea [place] and was on a CPAP machine but lost the mask and the machine when she moved to [place].  A split-night sleep study was ordered which showed an AHI of 5.9/hr with oxygen desaturations as low as 87%.  She was seen by Dr. Janace Hoard and ENT who recommended that if insurance would not pay for a CPAP device that she consider a device.  He also prescribed Flonase for her nasal congestion.  Past Medical History:  Diagnosis Date  . Carotid artery stenosis    1-39% bilateral stenosis by dopplers 12/2017  . Coronary artery disease    a. s/p CABG 10/2012.  cath 05/2016 with moderate LM stenosis, mild LCx and RCA stenosis and occluded LAD with patent LIMA to LAD, free radial to OM.  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  Marland Kitchen Dyslipidemia   . Generalized convulsive epilepsy without mention of intractable epilepsy   . Hypertension   . Memory deficit   . Migraine    "controlled on daily RX " (06/11/2016)  . Nephrolithiasis 07/30/2015   S/p lithotripsy x 3 and right and left ureter stents  . OSA (obstructive sleep apnea)    hx; was on CPAP; haven't needed it since OHS" (06/11/2016)  . PONV (postoperative nausea and vomiting)   . Seizures ([place])    "controlled w/daily RX; started in 2012; dr thinks they might be from the migraines; last sz was early part of 2016" (06/11/2016)  . Type II diabetes mellitus ([place])    diet controlled (06/11/2016)  . Vasovagal syncope 07/30/2015   2011    Past Surgical History:  Procedure Laterality Date  . CARDIAC CATHETERIZATION  2014; 06/11/2016  . CARDIAC CATHETERIZATION N/A 06/11/2016   Procedure: Left Heart Cath and Cors/Grafts Angiography;  Surgeon: Sherren Mocha, MD;  Location: [facility];  Service: Cardiovascular;  Laterality: N/A;  .  CESAREAN SECTION    . CORONARY ANGIOPLASTY    . CORONARY ARTERY BYPASS GRAFT  March, 2014   LIMA to LAD, left radial to LCx  . LITHOTRIPSY  X 3  . TOTAL ABDOMINAL HYSTERECTOMY    . TUBAL LIGATION    . URETERAL STENT PLACEMENT      Current Medications: Current Meds  Medication Sig  . acetaminophen (TYLENOL) 325 MG tablet Take 2 tablets (650 mg total) by mouth every 4 (four) hours as needed for headache or mild pain.  Marland Kitchen ALPRAZolam (XANAX) 0.25 MG tablet TAKE 1 TABLET BY MOUTH 3 TIMES A DAY AS NEEDED FOR ANXIETY  . aspirin 81 MG tablet Take 81 mg by mouth daily.  Marland Kitchen atorvastatin (LIPITOR) 80 MG tablet Take 1 tablet (80 mg total) by mouth daily.  . clopidogrel  (PLAVIX) 75 MG tablet TAKE ONE TABLET BY MOUTH DAILY  . escitalopram (LEXAPRO) 10 MG tablet Take 10 mg by mouth daily.  Marland Kitchen ezetimibe (ZETIA) 10 MG tablet TAKE 1 TABLET (10 MG TOTAL) BY MOUTH DAILY.  . furosemide (LASIX) 20 MG tablet Take 20 mg by mouth as needed for fluid.  Marland Kitchen levETIRAcetam (KEPPRA) 500 MG tablet TAKE 1 TABLET (500 MG TOTAL) BY MOUTH 2 (TWO) TIMES DAILY.  Marland Kitchen lisinopril (PRINIVIL,ZESTRIL) 20 MG tablet Take 1 tablet (20 mg total) by mouth daily.  . nitroGLYCERIN (NITROSTAT) 0.4 MG SL tablet Place 1 tablet (0.4 mg total) under the tongue every 5 (five) minutes x 3 doses as needed for chest pain.  . pantoprazole (PROTONIX) 40 MG tablet Take 1 tablet (40 mg total) by mouth daily.  Marland Kitchen topiramate (TOPAMAX) 25 MG tablet Take 3 tablets (75 mg total) by mouth 2 (two) times daily.  . [DISCONTINUED] metoprolol succinate (TOPROL-XL) 25 MG 24 hr tablet TAKE 1 TABLET (25 MG TOTAL) BY MOUTH DAILY.     Allergies:   Depakote [divalproex sodium]; Anesthesia s-i-60; and Penicillins   Social History   Socioeconomic History  . Marital status: Divorced    Spouse name: Not on file  . Number of children: 2  . Years of education: 75  . Highest education level: Not on file  Occupational History  . Occupation: retired  Scientific laboratory technician  . Financial resource strain: Not on file  . Food insecurity:    Worry: Not on file    Inability: Not on file  . Transportation needs:    Medical: Not on file    Non-medical: Not on file  Tobacco Use  . Smoking status: Current Some Day Smoker    Packs/day: 0.33    Years: 35.00    Pack years: 11.55    Types: Cigarettes    Last attempt to quit: 06/11/2016    Years since quitting: 1.8  . Smokeless tobacco: Former Systems developer    Quit date: 11/20/2012  Substance and Sexual Activity  . Alcohol use: Yes    Comment: 06/11/2016 "might have a few drinks/month; usually wine"  . Drug use: No  . Sexual activity: Not Currently  Lifestyle  . Physical activity:    Days per week:  Not on file    Minutes per session: Not on file  . Stress: Not on file  Relationships  . Social connections:    Talks on phone: Not on file    Gets together: Not on file    Attends religious service: Not on file    Active member of club or organization: Not on file    Attends meetings of clubs or  organizations: Not on file    Relationship status: Not on file  Other Topics Concern  . Not on file  Social History Narrative   Patient drinks about 1 cup of coffee daily.   Patient is right handed.      Family History: The patient's family history includes Cancer in her father; Diabetes in her brother, brother, and father; Heart attack in her sister; Heart disease in her brother and sister; Stroke in her brother.  ROS:   Please see the history of present illness.    ROS  All other systems reviewed and negative.   EKGs/Labs/Other Studies Reviewed:    The following studies were reviewed today: none  EKG:  EKG is not ordered today.    Recent Labs: 11/08/2017: BUN 10; Creatinine, Ser 0.98; Hemoglobin 11.9; Platelets 190; Potassium 3.8; Sodium 139 01/19/2018: ALT 25   Recent Lipid Panel    Component Value Date/Time   CHOL 174 01/19/2018 0000   TRIG 127 01/19/2018 0000   HDL 49 (L) 01/19/2018 0000   CHOLHDL 3.6 01/19/2018 0000   VLDL 17 06/11/2016 0521   LDLCALC 103 (H) 01/19/2018 0000    Physical Exam:    VS:  BP (!) 152/68 (BP Location: Right Arm, Patient Position: Sitting, Cuff Size: Normal)   Pulse 88   Ht 5\' 1"  (1.549 m)   Wt 151 lb (68.5 kg)   SpO2 98%   BMI 28.53 kg/m     Wt Readings from Last 3 Encounters:  04/04/18 151 lb (68.5 kg)  01/18/18 153 lb 6.4 oz (69.6 kg)  12/19/17 151 lb (68.5 kg)     GEN:  Well nourished, well developed in no acute distress HEENT: Normal NECK: No JVD; No carotid bruits LYMPHATICS: No lymphadenopathy CARDIAC: RRR, no murmurs, rubs, gallops RESPIRATORY:  Clear to auscultation without rales, wheezing or rhonchi  ABDOMEN: Soft,  non-tender, non-distended MUSCULOSKELETAL:  No edema; No deformity  SKIN: Warm and dry NEUROLOGIC:  Alert and oriented x 3 PSYCHIATRIC:  Normal affect   ASSESSMENT:    1. Atherosclerosis of native coronary artery of native heart with stable angina pectoris ([place])   2. Essential hypertension   3. Dyslipidemia   4. Asymptomatic bilateral carotid artery stenosis   5. OSA (obstructive sleep apnea)    PLAN:    In order of problems listed above:  1.  ASCAD - s/p CABG x2 in 2014 LIMA to the LAD, free radial to OM, felt to have microvascular disease, hypertension, HLD, DM type II, OSA, carotid artery disease. Stress test 05/2016 ischemia with follow-up cath patent LIMA to the LAD and free radial to OM with 30% in the RCA. She was felt to have microvascular disease.  Nuclear stress test for ongoing CP showed no ischemia.  Continue ASA, Plavix 75mg  daily, statin, BB and Imdur 90mg  daily.    2.  HTN - BP is borderline controlled on exam today.  Continue Lisinopril 20mg  daily and increased Toprol XL to 50mg  daily.    3.  Hyperlipidemia with LDL goal < 70.  She will continue on Zetia 10mg  daily and atorvastatin 80mg  daily.  LDL was 103 in May.  I am going to refer her to lipid clinic to see if a candidate for PCSK9i.    4.  Bilateral carotid artery stenosis - dopplers 12/2017 with 1-39% bilateral stenosis.  Continue ASA and statin.   5.  OSA -she has very mild obstructive sleep apnea with an AHI of 5.7/h but is convinced it is  causing her daytime sleepiness.  She was seen by ENT who prescribed some Flonase for nasal congestion.  I am going to order a CPAP titration to get her on CPAP.  Medication Adjustments/Labs and Tests Ordered: Current medicines are reviewed at length with the patient today.  Concerns regarding medicines are outlined above.  Orders Placed This Encounter  Procedures  . Split night study   Meds ordered this encounter  Medications  . metoprolol succinate (TOPROL-XL) 50 MG 24  hr tablet    Sig: Take 1 tablet (50 mg total) by mouth daily. Take with or immediately following a meal.    Dispense:  90 tablet    Refill:  3    Dose increase    Signed, Fransico Him, MD  04/04/2018 8:53 AM    [facility]

## 2018-04-04 NOTE — Telephone Encounter (Signed)
-----   Message from Sarina Ill, RN sent at 04/04/2018  8:53 AM EDT ----- Allene Pyo,  Dr. Radford Pax has a patient was had an order placed for a sleep study. I am sending to you to schedule and precert. Have a great day!

## 2018-04-04 NOTE — Telephone Encounter (Signed)
Auth request submitted for sleep study via [employer] portal.

## 2018-04-06 NOTE — Telephone Encounter (Signed)
Faxed additional clinical information as requested to [facility] [phone]).

## 2018-04-06 NOTE — Telephone Encounter (Signed)
Message to Dr Radford Pax, [facility] will not support a sleep study for replacement of equipment, and documentation submitted does not support need for sleep study at this time. They recommended obtaining replacement equipment based on the previous dx. Used for equipment pt had before. Awaiting Dr Theodosia Blender recommendation.

## 2018-04-11 ENCOUNTER — Telehealth: Payer: Self-pay | Admitting: *Deleted

## 2018-04-11 DIAGNOSIS — G4733 Obstructive sleep apnea (adult) (pediatric): Secondary | ICD-10-CM

## 2018-04-11 NOTE — Telephone Encounter (Addendum)
Gave patient recommendations made by Dr. Radford Pax and  understanding was verbalized. Upon patient request DME selection is [facility] Patient understands she will be contacted by [facility] to set up her cpap. Patient understands to call if [facility] does not contact her with new setup in a timely manner. Patient understands they will be called once confirmation has been received from [facility] that they have received their new machine to schedule 10 week follow up appointment.  [facility] notified of new cpap order  Please add to airview Patient was grateful for the call and thanked me.

## 2018-04-11 NOTE — Telephone Encounter (Signed)
-----   Message from Sueanne Margarita, MD sent at 04/10/2018  6:49 PM EDT ----- Regarding: RE: Request for Sleep Study denied Please order Airsense CPAP on auto from 4 to 18cm H2O and get a download in 2 weeks.  Use heated humidity and mask of choice and followup in 10 weeks  Traci Turner ----- Message ----- From: Chapman Moss, RN Sent: 04/06/2018   4:48 PM EDT To: Sueanne Margarita, MD, Freada Bergeron, CMA Subject: Request for Sleep Study denied                 [facility] will not support a sleep study for replacement of equipment, and documentation submitted does not support need for sleep study at this time.  They recommended obtaining replacement equipment based on the previous dx. Used for equipment pt had before.  Thanks,  Con Memos

## 2018-04-25 DIAGNOSIS — G4733 Obstructive sleep apnea (adult) (pediatric): Secondary | ICD-10-CM | POA: Diagnosis not present

## 2018-05-02 NOTE — Telephone Encounter (Signed)
Patient/daughter has a 10 week follow up appointment scheduled for .11/20 2019. Patient/daughter understands she needs to keep this appointment for insurance compliance. Patient was grateful for the call and thanked me.

## 2018-05-17 DIAGNOSIS — M25551 Pain in right hip: Secondary | ICD-10-CM | POA: Diagnosis not present

## 2018-05-17 DIAGNOSIS — Z1159 Encounter for screening for other viral diseases: Secondary | ICD-10-CM | POA: Diagnosis not present

## 2018-05-17 DIAGNOSIS — I10 Essential (primary) hypertension: Secondary | ICD-10-CM | POA: Diagnosis not present

## 2018-05-17 DIAGNOSIS — I2510 Atherosclerotic heart disease of native coronary artery without angina pectoris: Secondary | ICD-10-CM | POA: Diagnosis not present

## 2018-05-17 DIAGNOSIS — Z0001 Encounter for general adult medical examination with abnormal findings: Secondary | ICD-10-CM | POA: Diagnosis not present

## 2018-05-17 DIAGNOSIS — R69 Illness, unspecified: Secondary | ICD-10-CM | POA: Diagnosis not present

## 2018-05-17 DIAGNOSIS — E119 Type 2 diabetes mellitus without complications: Secondary | ICD-10-CM | POA: Diagnosis not present

## 2018-05-17 DIAGNOSIS — R05 Cough: Secondary | ICD-10-CM | POA: Diagnosis not present

## 2018-05-17 DIAGNOSIS — Z23 Encounter for immunization: Secondary | ICD-10-CM | POA: Diagnosis not present

## 2018-05-18 ENCOUNTER — Ambulatory Visit
Admission: RE | Admit: 2018-05-18 | Discharge: 2018-05-18 | Disposition: A | Discharge status: Home / Self Care | Payer: Medicare HMO | Source: Ambulatory Visit | Attending: Family Medicine | Admitting: Family Medicine

## 2018-05-18 ENCOUNTER — Other Ambulatory Visit: Payer: Self-pay | Admitting: Family Medicine

## 2018-05-18 DIAGNOSIS — M25551 Pain in right hip: Secondary | ICD-10-CM

## 2018-05-18 IMAGING — DX DG HIP (WITH OR WITHOUT PELVIS) 2-3V*R*
2 series · 2 of 2 positions shown · non-contrast
Comparison: None.

CLINICAL DATA: Anterolateral right hip pain for 1.5 years with no
known injury

EXAM:
DG HIP (WITH OR WITHOUT PELVIS) 2-3V RIGHT

[dg hip unilat w or w/o pelvis 2-3 views  (1 of 2)]
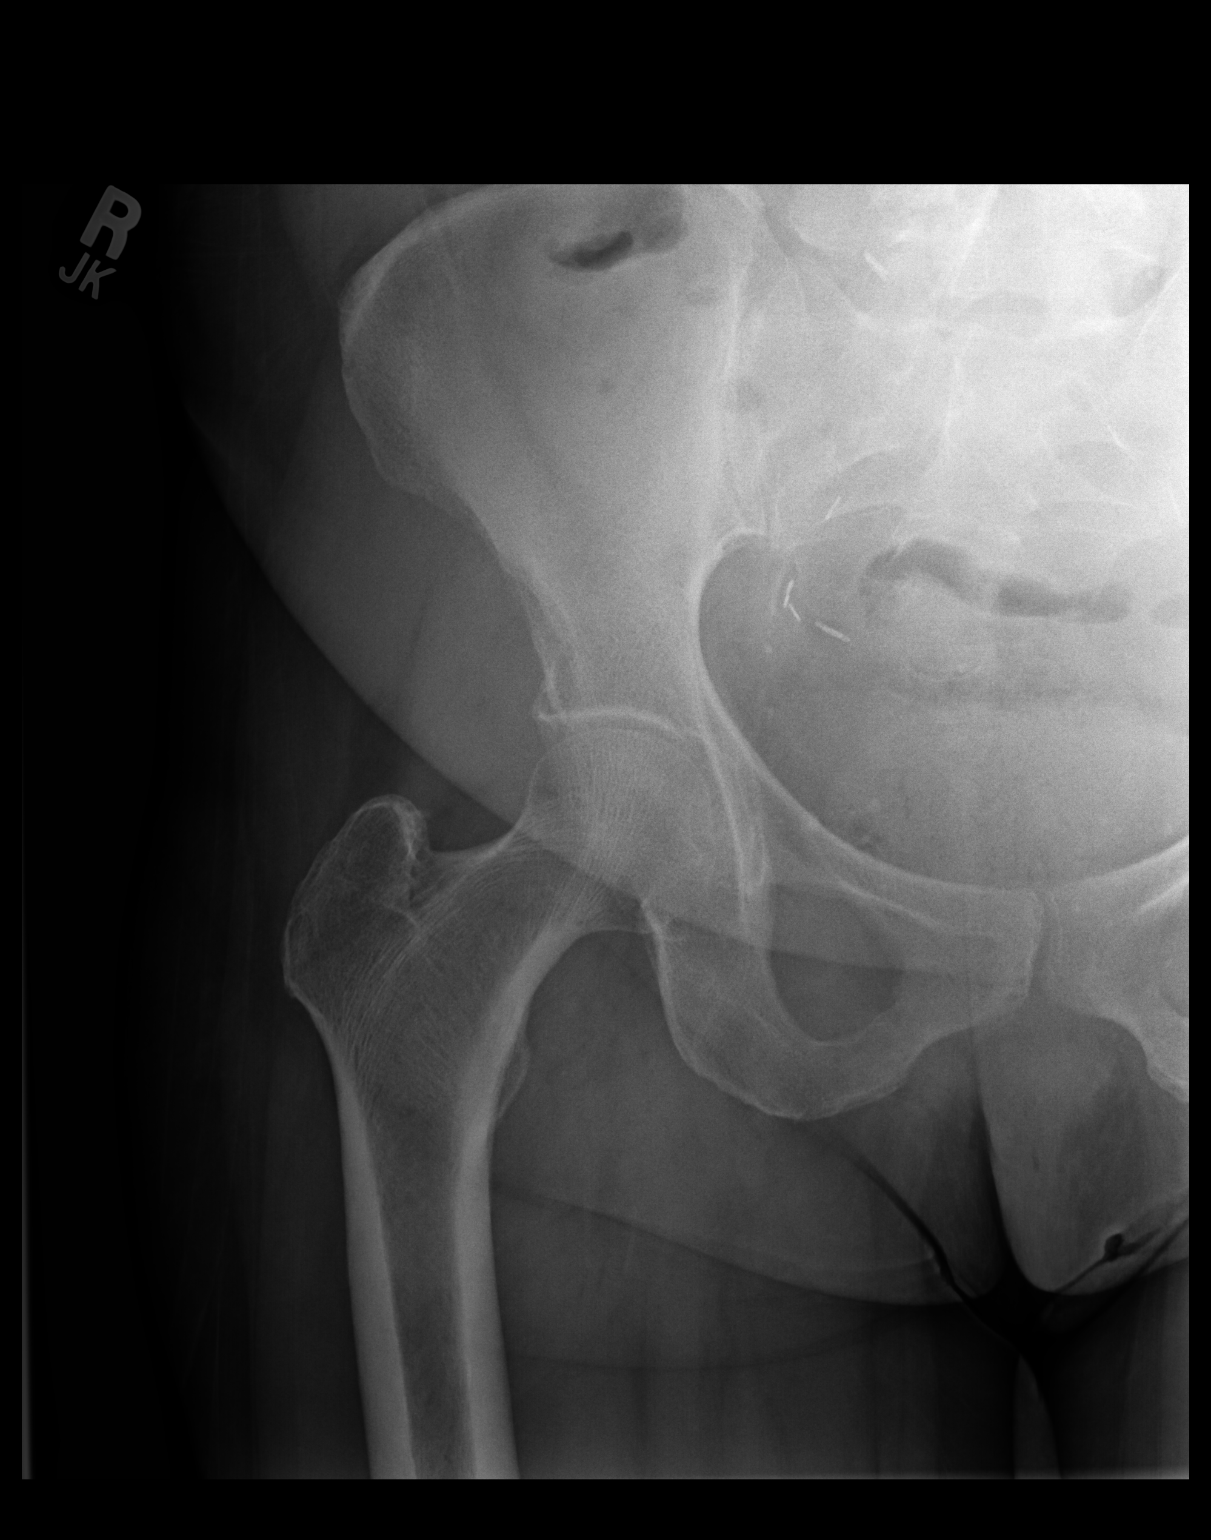

[dg hip unilat w or w/o pelvis 2-3 views  (2 of 2)]
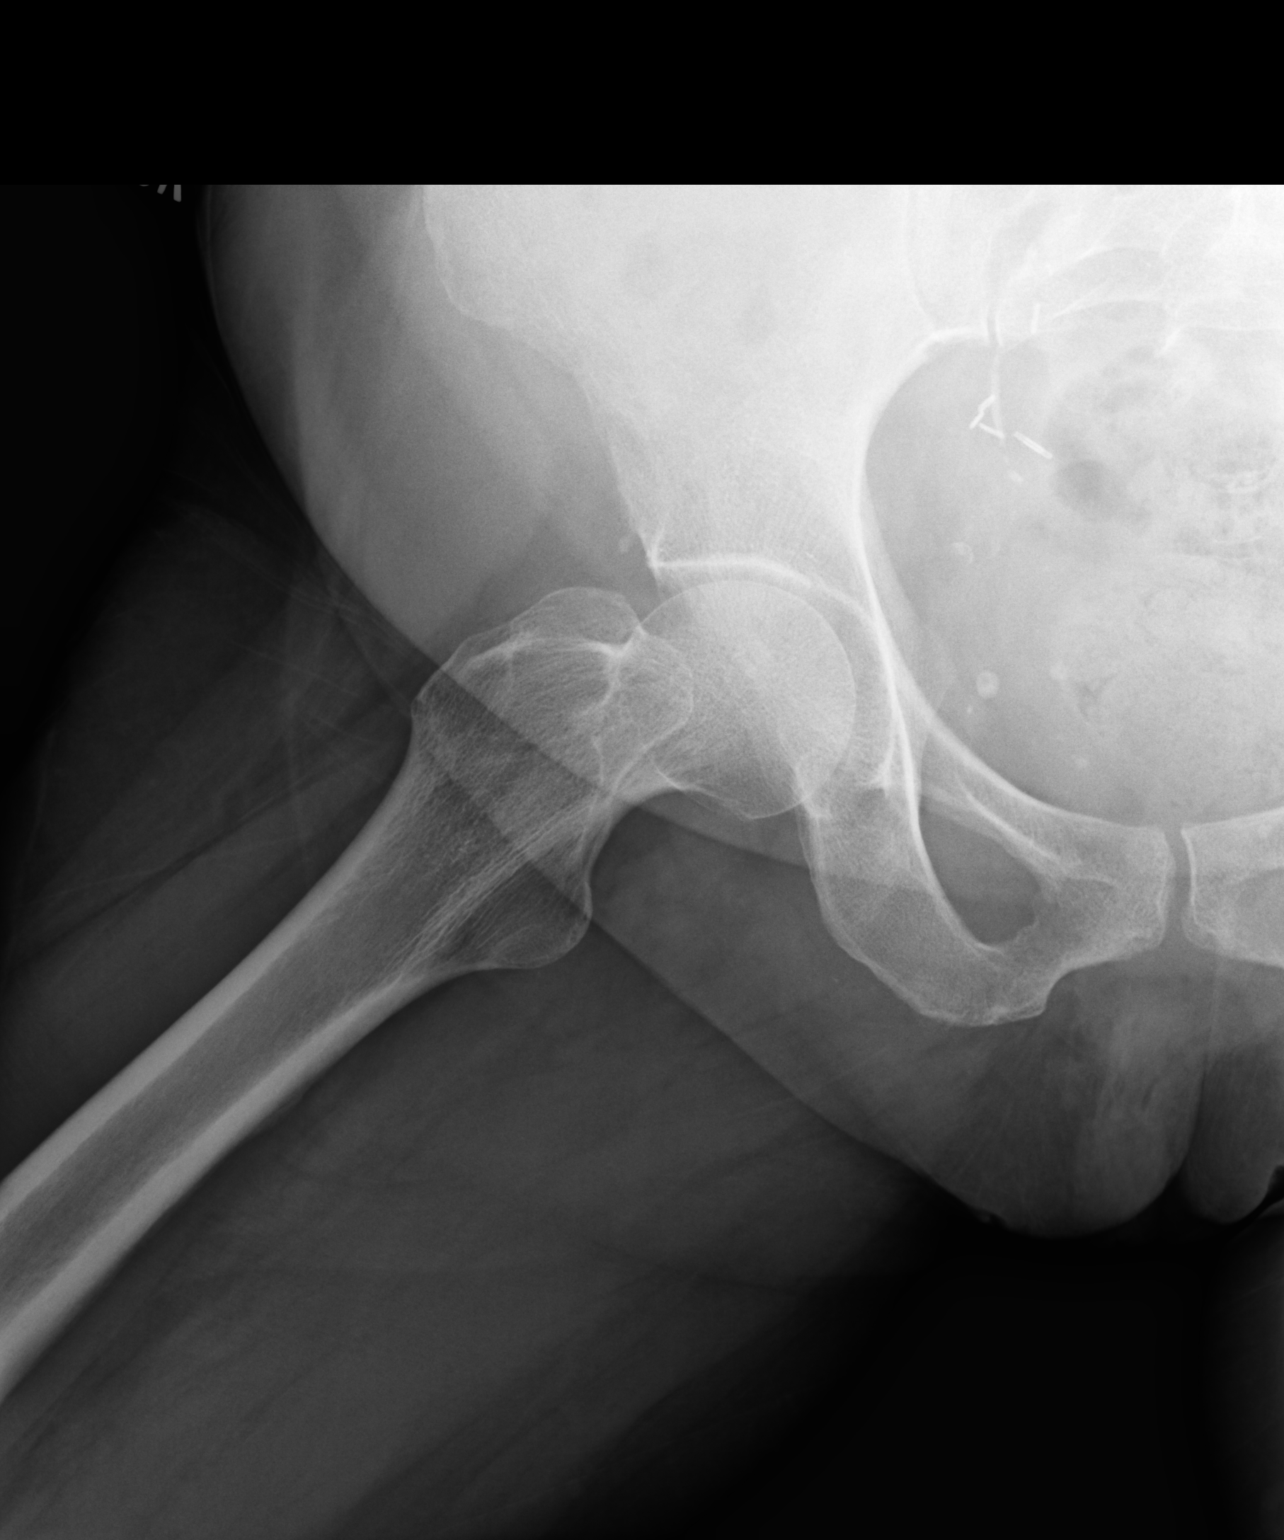

[2 of 2 positions shown; findings below may reference images not displayed]

FINDINGS: There is no evidence of hip fracture or dislocation. There is no
evidence of arthropathy or other focal bone abnormality.
IMPRESSION: Negative.

## 2018-05-25 DIAGNOSIS — G4733 Obstructive sleep apnea (adult) (pediatric): Secondary | ICD-10-CM | POA: Diagnosis not present

## 2018-05-31 DIAGNOSIS — I25119 Atherosclerotic heart disease of native coronary artery with unspecified angina pectoris: Secondary | ICD-10-CM | POA: Diagnosis not present

## 2018-05-31 DIAGNOSIS — K219 Gastro-esophageal reflux disease without esophagitis: Secondary | ICD-10-CM | POA: Diagnosis not present

## 2018-05-31 DIAGNOSIS — I10 Essential (primary) hypertension: Secondary | ICD-10-CM | POA: Diagnosis not present

## 2018-05-31 DIAGNOSIS — E663 Overweight: Secondary | ICD-10-CM | POA: Diagnosis not present

## 2018-05-31 DIAGNOSIS — E785 Hyperlipidemia, unspecified: Secondary | ICD-10-CM | POA: Diagnosis not present

## 2018-05-31 DIAGNOSIS — E119 Type 2 diabetes mellitus without complications: Secondary | ICD-10-CM | POA: Diagnosis not present

## 2018-05-31 DIAGNOSIS — G40909 Epilepsy, unspecified, not intractable, without status epilepticus: Secondary | ICD-10-CM | POA: Diagnosis not present

## 2018-05-31 DIAGNOSIS — G43809 Other migraine, not intractable, without status migrainosus: Secondary | ICD-10-CM | POA: Diagnosis not present

## 2018-05-31 DIAGNOSIS — R69 Illness, unspecified: Secondary | ICD-10-CM | POA: Diagnosis not present

## 2018-06-02 DIAGNOSIS — G4733 Obstructive sleep apnea (adult) (pediatric): Secondary | ICD-10-CM | POA: Diagnosis not present

## 2018-06-07 ENCOUNTER — Telehealth: Payer: Self-pay | Admitting: *Deleted

## 2018-06-07 NOTE — Telephone Encounter (Signed)
-----   Message from Sueanne Margarita, MD sent at 06/05/2018  6:36 PM EDT ----- Good AHI and compliance.  Continue current PAP settings.

## 2018-06-07 NOTE — Telephone Encounter (Signed)
Informed patient of compliance results and patient understanding was verbalized. Patient is aware and agreeable to AHI being within range at 2.4. Patient is aware and agreeable to being in compliance with machine usage. Pt is aware and agreeable to normal results.

## 2018-06-12 ENCOUNTER — Telehealth: Payer: Self-pay | Admitting: Pharmacist

## 2018-06-12 NOTE — Telephone Encounter (Signed)
LMOM to set up lipid clinic appt per Dr. Radford Pax.

## 2018-06-13 ENCOUNTER — Ambulatory Visit (INDEPENDENT_AMBULATORY_CARE_PROVIDER_SITE_OTHER): Payer: Medicare HMO | Admitting: Pharmacist

## 2018-06-13 DIAGNOSIS — E785 Hyperlipidemia, unspecified: Secondary | ICD-10-CM | POA: Diagnosis not present

## 2018-06-13 MED ORDER — ATORVASTATIN CALCIUM 80 MG PO TABS: 80.0000 mg | ORAL_TABLET | Freq: Every day | ORAL | 3 refills | Status: DC

## 2018-06-13 NOTE — Progress Notes (Addendum)
Patient ID: Samantha Huffman                 DOB: 1955-08-31                    MRN: [id number]     HPI: Samantha Huffman is a 62 y.o. female patient referred to lipid clinic by Dr Radford Pax. PMH is significant for CAD s/p CABG x2 in 2014, HTN, HLD, DM2, and OSA. LDL was 103 back in May on atorvastatin 80mg  and Zetia 10mg  daily. She was referred to lipid clinic to discuss PCSK9i therapy.  Pt presents today in good spirits. She reports adherence to her atorvastatin and ezetimibe each day. She has lost a few pounds as well and has been making dietary improvement including eating lean meats, more vegetables, and cutting out fried food.  Current Medications: atorvastatin 80mg  daily, Zetia 10mg  daily  Risk Factors: CAD s/p CABG, HTN, DM, HLD  LDL goal: 70mg /dL  Diet: Improved her diet - eating chicken, [place], fish, and vegetables. Cut out fried food - baking and broiling meat.  Exercise: Walking 2-3 days a week.  Family History: The patient's family history includes Cancer in her father; Diabetes in her brother, brother, and father; Heart attack in her sister; Heart disease in her brother and sister; Stroke in her brother.  Social History: Smokes 1/3 PPD, tobacco abuse for the past 35 years. Occasional alcohol use and denies illicit drug use.  Labs: 01/19/18: TC 174, HDL 49, TG 127, LDL 103 (atorvastatin 80mg  daily, Zetia 10mg  daily)  Past Medical History:  Diagnosis Date  . Carotid artery stenosis    1-39% bilateral stenosis by dopplers 12/2017  . Coronary artery disease    a. s/p CABG 10/2012.  cath 05/2016 with moderate LM stenosis, mild LCx and RCA stenosis and occluded LAD with patent LIMA to LAD, free radial to OM.  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  Marland Kitchen Dyslipidemia   . Generalized convulsive epilepsy without mention of intractable epilepsy   . Hypertension   . Memory deficit   . Migraine    "controlled on daily RX " (06/11/2016)  . Nephrolithiasis 07/30/2015   S/p lithotripsy x 3  and right and left ureter stents  . OSA (obstructive sleep apnea)    hx; was on CPAP; haven't needed it since OHS" (06/11/2016)  . PONV (postoperative nausea and vomiting)   . Seizures ([place])    "controlled w/daily RX; started in 2012; dr thinks they might be from the migraines; last sz was early part of 2016" (06/11/2016)  . Type II diabetes mellitus ([place])    diet controlled (06/11/2016)  . Vasovagal syncope 07/30/2015   2011    Current Outpatient Medications on File Prior to Visit  Medication Sig Dispense Refill  . acetaminophen (TYLENOL) 325 MG tablet Take 2 tablets (650 mg total) by mouth every 4 (four) hours as needed for headache or mild pain.    Marland Kitchen ALPRAZolam (XANAX) 0.25 MG tablet TAKE 1 TABLET BY MOUTH 3 TIMES A DAY AS NEEDED FOR ANXIETY  0  . aspirin 81 MG tablet Take 81 mg by mouth daily.    Marland Kitchen atorvastatin (LIPITOR) 80 MG tablet Take 1 tablet (80 mg total) by mouth daily. 90 tablet 3  . clopidogrel (PLAVIX) 75 MG tablet TAKE ONE TABLET BY MOUTH DAILY 30 tablet 9  . escitalopram (LEXAPRO) 10 MG tablet Take 10 mg by mouth daily.    Marland Kitchen ezetimibe (ZETIA) 10 MG tablet TAKE 1  TABLET (10 MG TOTAL) BY MOUTH DAILY. 30 tablet 9  . furosemide (LASIX) 20 MG tablet Take 20 mg by mouth as needed for fluid.    . isosorbide mononitrate (IMDUR) 60 MG 24 hr tablet Take 1.5 tablets (90 mg total) by mouth daily. 45 tablet 2  . levETIRAcetam (KEPPRA) 500 MG tablet TAKE 1 TABLET (500 MG TOTAL) BY MOUTH 2 (TWO) TIMES DAILY. 180 tablet 1  . lisinopril (PRINIVIL,ZESTRIL) 20 MG tablet Take 1 tablet (20 mg total) by mouth daily. 30 tablet 11  . metoprolol succinate (TOPROL-XL) 50 MG 24 hr tablet Take 1 tablet (50 mg total) by mouth daily. Take with or immediately following a meal. 90 tablet 3  . nitroGLYCERIN (NITROSTAT) 0.4 MG SL tablet Place 1 tablet (0.4 mg total) under the tongue every 5 (five) minutes x 3 doses as needed for chest pain. 25 tablet 4  . pantoprazole (PROTONIX) 40 MG tablet Take 1 tablet  (40 mg total) by mouth daily. 30 tablet 11  . topiramate (TOPAMAX) 25 MG tablet Take 3 tablets (75 mg total) by mouth 2 (two) times daily. 540 tablet 3   No current facility-administered medications on file prior to visit.     Allergies  Allergen Reactions  . Depakote [Divalproex Sodium] Other (See Comments)    Hyperammonemia  . Anesthesia S-I-60     Nausea per pt  . Penicillins     Other reaction(s): GI intolerance Has patient had a PCN reaction causing immediate rash, facial/tongue/throat swelling, SOB or lightheadedness with hypotension: YES Has patient had a PCN reaction causing severe rash involving mucus membranes or skin necrosis: NO Has patient had a PCN reaction that required hospitalizationNO Has patient had a PCN reaction occurring within the last 10 years: NO If all of the above answers are "NO", then may proceed with Cephalosporin use.    Assessment/Plan:  1. Hyperlipidemia - LDL 103 in May above goal 70mg /dL due to hx of ASCVD despite therapy with atorvastatin 80mg  daily and ezetimibe 10mg  daily. Will recheck baseline today. Discussed PCSK9i therapy including expected benefits and injection technique. Will submit prior authorization for Praluent therapy once baseline LDL returns from today. Pt has Pharmacist, community and should qualify for copay card. Encouraged pt to continue with low fat diet and exercise.   Willodene Stallings E. Wladyslawa Disbro, PharmD, BCACP, [facility] [address], [place], [place] [address] Phone: [phone]; Fax: [phone] 06/13/2018 3:29 PM   Addendum: LDL now at goal < 70 on atorvastatin 80mg  daily and ezetimibe 10mg  daily. Improvement in numbers likely due to dietary improvement by patient. Do not need to start PCKS9i therapy since LDL is at goal and insurance will also not cover PCSK9i with LDL < 70. Pt aware to continue current regimen.

## 2018-06-13 NOTE — Patient Instructions (Signed)
It was nice to meet you today  We will start the paperwork process for Praluent injections for your cholesterol  Your LDL goal is < 70  Continue taking your atorvastatin 80mg  daily and ezetimibe 10mg  daily for your cholesterol  I will call you when your insurance approves the Praluent medication. If you have any questions, call Jinny Blossom, Pharmacist in clinic [phone]

## 2018-06-13 NOTE — Telephone Encounter (Signed)
APPT made for today.

## 2018-06-14 LAB — LIPID PANEL
CHOLESTEROL TOTAL: 124 mg/dL (ref 100–199)
Chol/HDL Ratio: 2.6 ratio (ref 0.0–4.4)
HDL: 48 mg/dL (ref >39)
LDL Calculated: 62 mg/dL (ref 0–99)
Triglycerides: 69 mg/dL (ref 0–149)
VLDL Cholesterol Cal: 14 mg/dL (ref 5–40)

## 2018-06-14 LAB — LDL CHOLESTEROL, DIRECT: LDL Direct: 67 mg/dL (ref 0–99)

## 2018-06-21 ENCOUNTER — Telehealth: Payer: Self-pay | Admitting: *Deleted

## 2018-06-21 NOTE — Telephone Encounter (Signed)
-----   Message from Sueanne Margarita, MD sent at 06/18/2018  2:48 PM EDT ----- Good AHI and compliance.  Continue current PAP settings.

## 2018-06-21 NOTE — Telephone Encounter (Signed)
Informed patient of compliance results and verbalized understanding was indicated. Patient is aware and agreeable to AHI being within range at 2.3. Patient is aware and agreeable to being in compliance with machine usage Patient is aware and agreeable to no change in current pressures

## 2018-06-25 DIAGNOSIS — G4733 Obstructive sleep apnea (adult) (pediatric): Secondary | ICD-10-CM | POA: Diagnosis not present

## 2018-07-12 ENCOUNTER — Ambulatory Visit: Payer: Medicare HMO | Admitting: Cardiology

## 2018-07-12 VITALS — BP 162/90 | HR 72 | Ht 61.0 in | Wt 149.0 lb

## 2018-07-12 DIAGNOSIS — E785 Hyperlipidemia, unspecified: Secondary | ICD-10-CM

## 2018-07-12 DIAGNOSIS — R079 Chest pain, unspecified: Secondary | ICD-10-CM | POA: Diagnosis not present

## 2018-07-12 DIAGNOSIS — I6523 Occlusion and stenosis of bilateral carotid arteries: Secondary | ICD-10-CM

## 2018-07-12 DIAGNOSIS — R55 Syncope and collapse: Secondary | ICD-10-CM

## 2018-07-12 DIAGNOSIS — I2510 Atherosclerotic heart disease of native coronary artery without angina pectoris: Secondary | ICD-10-CM

## 2018-07-12 DIAGNOSIS — I10 Essential (primary) hypertension: Secondary | ICD-10-CM | POA: Diagnosis not present

## 2018-07-12 DIAGNOSIS — G4733 Obstructive sleep apnea (adult) (pediatric): Secondary | ICD-10-CM | POA: Diagnosis not present

## 2018-07-12 MED ORDER — ISOSORBIDE MONONITRATE ER 120 MG PO TB24: 120.0000 mg | ORAL_TABLET | Freq: Every day | ORAL | 3 refills | Status: DC

## 2018-07-12 NOTE — Progress Notes (Signed)
Cardiology Office Note:    Date:  07/12/2018   ID:  Samantha Huffman, DOB 05-21-1956, MRN [id number]  PCP:  Samantha Huffman, Samantha Huffman, Samantha Huffman  Cardiologist:  Samantha Huffman, Samantha Huffman    Referring Samantha Huffman: Samantha Huffman, Samantha Huffman, Samantha Huffman   Chief Complaint  Patient presents with  . Coronary Artery Disease  . Hypertension  . Sleep Apnea    History of Present Illness:    Samantha Huffman is a 62 y.o. 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 Stress test 05/2016 ischemia with follow-up cath patent LIMA to the LAD and free radial to OM with 30% in the RCA. 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 Last 2D echo 01/2017 normal LVEF no wall motion abnormalities.  Due to ongoing sharp CP she underwent stress Myoview which showed no evidence of ischemia.   She is here today for followup and is quite tearful today.  She recently lost her nephew who she was very close to due to heart failure.  She has been under a lot of stress because of this and is now worried about her own heart health.  Since he became ill about a month ago she has been very anxious and has been noticing chest tightness when she gets upset.  She says she will feel really tired after she walks and has some shortness of breath.  She had a nuclear stress test back in April 2019 which showed no ischemia.  She is compliant with her meds and is tolerating meds with no SE.  She is doing well with her CPAP device.  She tolerates the mask and feels the pressure is adequate.  Since going on CPAP she feels rested in the am and has no significant daytime sleepiness.  She denies any significant mouth or nasal dryness or nasal congestion.  She does not think that he snores.      Past Medical History:  Diagnosis Date  . Carotid artery stenosis    1-39% bilateral stenosis by dopplers 12/2017  . Coronary artery disease    a.  s/p CABG 10/2012.  cath 05/2016 with moderate LM stenosis, mild LCx and RCA stenosis and occluded LAD with patent LIMA to LAD, free radial to OM.  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  Marland Kitchen Dyslipidemia   . Generalized convulsive epilepsy without mention of intractable epilepsy   . Hypertension   . Memory deficit   . Migraine    "controlled on daily RX " (06/11/2016)  . Nephrolithiasis 07/30/2015   S/p lithotripsy x 3 and right and left ureter stents  . OSA (obstructive sleep apnea)    hx; was on CPAP; haven't needed it since OHS" (06/11/2016)  . PONV (postoperative nausea and vomiting)   . Seizures ([place])    "controlled w/daily RX; started in 2012; dr thinks they might be from the migraines; last sz was early part of 2016" (06/11/2016)  . Type II diabetes mellitus ([place])    diet controlled (06/11/2016)  . Vasovagal syncope 07/30/2015   2011    Past Surgical History:  Procedure Laterality Date  . CARDIAC CATHETERIZATION  2014; 06/11/2016  . CARDIAC CATHETERIZATION N/A 06/11/2016   Procedure: Left Heart Cath and Cors/Grafts Angiography;  Surgeon: Sherren Mocha, Samantha Huffman;  Location: [facility];  Service: Cardiovascular;  Laterality: N/A;  . CESAREAN SECTION    .  CORONARY ANGIOPLASTY    . CORONARY ARTERY BYPASS GRAFT  March, 2014   LIMA to LAD, left radial to LCx  . LITHOTRIPSY  X 3  . TOTAL ABDOMINAL HYSTERECTOMY    . TUBAL LIGATION    . URETERAL STENT PLACEMENT      Current Medications: Current Meds  Medication Sig  . acetaminophen (TYLENOL) 325 MG tablet Take 2 tablets (650 mg total) by mouth every 4 (four) hours as needed for headache or mild pain.  Marland Kitchen ALPRAZolam (XANAX) 0.25 MG tablet TAKE 1 TABLET BY MOUTH 3 TIMES A DAY AS NEEDED FOR ANXIETY  . aspirin 81 MG tablet Take 81 mg by mouth daily.  Marland Kitchen atorvastatin (LIPITOR) 80 MG tablet Take 1 tablet (80 mg total) by mouth daily.  . clopidogrel (PLAVIX) 75 MG tablet TAKE ONE TABLET BY MOUTH DAILY  . escitalopram (LEXAPRO) 10 MG  tablet Take 10 mg by mouth daily.  Marland Kitchen ezetimibe (ZETIA) 10 MG tablet TAKE 1 TABLET (10 MG TOTAL) BY MOUTH DAILY.  . furosemide (LASIX) 20 MG tablet Take 20 mg by mouth as needed for fluid.  Marland Kitchen levETIRAcetam (KEPPRA) 500 MG tablet TAKE 1 TABLET (500 MG TOTAL) BY MOUTH 2 (TWO) TIMES DAILY.  Marland Kitchen lisinopril (PRINIVIL,ZESTRIL) 20 MG tablet Take 1 tablet (20 mg total) by mouth daily.  . metFORMIN (GLUCOPHAGE-XR) 500 MG 24 hr tablet Take 500 mg by mouth daily.  . metoprolol succinate (TOPROL-XL) 50 MG 24 hr tablet Take 1 tablet (50 mg total) by mouth daily. Take with or immediately following a meal.  . nitroGLYCERIN (NITROSTAT) 0.4 MG SL tablet Place 1 tablet (0.4 mg total) under the tongue every 5 (five) minutes x 3 doses as needed for chest pain.  . pantoprazole (PROTONIX) 40 MG tablet Take 1 tablet (40 mg total) by mouth daily.  Marland Kitchen topiramate (TOPAMAX) 25 MG tablet Take 3 tablets (75 mg total) by mouth 2 (two) times daily.  . [DISCONTINUED] isosorbide mononitrate (IMDUR) 60 MG 24 hr tablet Take 1.5 tablets (90 mg total) by mouth daily.     Allergies:   Depakote [divalproex sodium]; Anesthesia s-i-60; and Penicillins   Social History   Socioeconomic History  . Marital status: Divorced    Spouse name: Not on file  . Number of children: 2  . Years of education: 36  . Highest education level: Not on file  Occupational History  . Occupation: retired  Scientific laboratory technician  . Financial resource strain: Not on file  . Food insecurity:    Worry: Not on file    Inability: Not on file  . Transportation needs:    Medical: Not on file    Non-medical: Not on file  Tobacco Use  . Smoking status: Current Some Day Smoker    Packs/day: 0.33    Years: 35.00    Pack years: 11.55    Types: Cigarettes    Last attempt to quit: 06/11/2016    Years since quitting: 2.0  . Smokeless tobacco: Former Systems developer    Quit date: 11/20/2012  Substance and Sexual Activity  . Alcohol use: Yes    Comment: 06/11/2016 "might have a  few drinks/month; usually wine"  . Drug use: No  . Sexual activity: Not Currently  Lifestyle  . Physical activity:    Days per week: Not on file    Minutes per session: Not on file  . Stress: Not on file  Relationships  . Social connections:    Talks on phone: Not on file  Gets together: Not on file    Attends religious service: Not on file    Active member of club or organization: Not on file    Attends meetings of clubs or organizations: Not on file    Relationship status: Not on file  Other Topics Concern  . Not on file  Social History Narrative   Patient drinks about 1 cup of coffee daily.   Patient is right handed.      Family History: The patient's family history includes Cancer in her father; Diabetes in her brother, brother, and father; Heart attack in her sister; Heart disease in her brother and sister; Stroke in her brother.  ROS:   Please see the history of present illness.    ROS  All other systems reviewed and negative.   EKGs/Labs/Other Studies Reviewed:    The following studies were reviewed today: none  EKG:  EKG is not ordered today.   Recent Labs: 11/08/2017: BUN 10; Creatinine, Ser 0.98; Hemoglobin 11.9; Platelets 190; Potassium 3.8; Sodium 139 01/19/2018: ALT 25   Recent Lipid Panel    Component Value Date/Time   CHOL 124 06/13/2018 1517   TRIG 69 06/13/2018 1517   HDL 48 06/13/2018 1517   CHOLHDL 2.6 06/13/2018 1517   CHOLHDL 3.6 01/19/2018 0000   VLDL 17 06/11/2016 0521   LDLCALC 62 06/13/2018 1517   LDLCALC 103 (H) 01/19/2018 0000   LDLDIRECT 67 06/13/2018 1517    Physical Exam:    VS:  BP (!) 162/90   Pulse 72   Ht 5\' 1"  (1.549 m)   Wt 149 lb (67.6 kg)   BMI 28.15 kg/m     Wt Readings from Last 3 Encounters:  07/12/18 149 lb (67.6 kg)  04/04/18 151 lb (68.5 kg)  01/18/18 153 lb 6.4 oz (69.6 kg)     GEN:  Well nourished, well developed in no acute distress HEENT: Normal NECK: No JVD; No carotid bruits LYMPHATICS: No  lymphadenopathy CARDIAC: RRR, no murmurs, rubs, gallops RESPIRATORY:  Clear to auscultation without rales, wheezing or rhonchi  ABDOMEN: Soft, non-tender, non-distended MUSCULOSKELETAL:  No edema; No deformity  SKIN: Warm and dry NEUROLOGIC:  Alert and oriented x 3 PSYCHIATRIC:  Normal affect   ASSESSMENT:    1. Atherosclerosis of native coronary artery of native heart without angina pectoris   2. Vasovagal syncope   3. Essential hypertension   4. Asymptomatic bilateral carotid artery stenosis   5. OSA (obstructive sleep apnea)   6. Dyslipidemia   7. Chest pain, unspecified type    PLAN:    In order of problems listed above:  1.  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  Repeat cath in 2017 showed  patent LIMA to the LAD and free radial to OM with 30% in the RCA. 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  She recently has been having tightness in her chest when she gets emotionally upset and occasionally when she exerts herself but she says she is upset all the time over her nephew dying.  She was doing okay before this.  She had a cath in 2017 which showed patent grafts and she is felt to have microvascular disease.  A nuclear stress test in April 2019 showed no ischemia.  I am going to increase her Imdur to 120 mg daily.  She will continue on aspirin 81 mg daily, Plavix 75 mg daily,  Lipitor 80 mg daily and beta-blocker.  She will follow-up with 1 of my extender is in 4 weeks to make sure she is doing okay.  I will repeat an echo to make sure LV function is normal since she has not had one in over a year.  2.  HTN -BP is mildly elevated today 162/90 mmHg but the patient is very upset after the death recently of her nephew.  IShe will continue Toprol-XL 50 mg daily and lisinopril 20 mg daily.  I am increasing her Imdur to 120 mg daily and this should help control blood pressure.  I suspect blood pressure also  was elevated because of her being upset.  Her creatinine was stable at 1.0 on 05/17/2018 and potassium 4.3.  3.  Asymptomatic bilateral carotid artery stenosis -Dopplers 12/26/2017 showed mild disease at 1 to 39% bilateral stenosis.  She will continue on aspirin and Plavix as well as statin.  4.  OSA - the patient is tolerating PAP therapy well without any problems. The PAP download was reviewed today and showed an AHI of 1.8/hr on auto PAP with 90% compliance in using more than 4 hours nightly.  The patient has been using and benefiting from PAP use and will continue to benefit from therapy.   5.  Hyperlipidemia -LDL goal is less than 70.  Her LDL was 67 on 06/13/2018 with normal ALT at 25 on 01/20/2018.  She will continue on Zetia 10 mg daily and Lipitor 80 mg daily.   Medication Adjustments/Labs and Tests Ordered: Current medicines are reviewed at length with the patient today.  Concerns regarding medicines are outlined above.  Orders Placed This Encounter  Procedures  . ECHOCARDIOGRAM COMPLETE   Meds ordered this encounter  Medications  . isosorbide mononitrate (IMDUR) 120 MG 24 hr tablet    Sig: Take 1 tablet (120 mg total) by mouth daily.    Dispense:  90 tablet    Refill:  3    Dose increase, do not fill, patient will call    Signed, Samantha Huffman, Samantha Huffman  07/12/2018 9:54 AM    [place]

## 2018-07-12 NOTE — Patient Instructions (Signed)
Medication Instructions:  Increase: Imdur to 120 mg, daily, by mouth  If you need a refill on your cardiac medications before your next appointment, please call your pharmacy.   Lab work:  If you have labs (blood work) drawn today and your tests are completely normal, you will receive your results only by: Marland Kitchen MyChart Message (if you have MyChart) OR . A paper copy in the mail If you have any lab test that is abnormal or we need to change your treatment, we will call you to review the results.  Testing/Procedures: Your physician has requested that you have an echocardiogram. Echocardiography is a painless test that uses sound waves to create images of your heart. It provides your doctor with information about the size and shape of your heart and how well your heart's chambers and valves are working. This procedure takes approximately one hour. There are no restrictions for this procedure.  Follow-Up: At [facility], you and your health needs are our priority.  As part of our continuing mission to provide you with exceptional heart care, we have created designated Provider Care Teams.  These Care Teams include your primary Cardiologist (physician) and Advanced Practice Providers (APPs -  Physician Assistants and Nurse Practitioners) who all work together to provide you with the care you need, when you need it.  Your physician recommends that you schedule a follow-up appointment in: 4 weeks with PA.  You will need a follow up appointment in 1 years.  Please call our office 2 months in advance to schedule this appointment.  You may see Fransico Him, MD or one of the following Advanced Practice Providers on your designated Care Team:   Trout, PA-C Melina Copa, PA-C . Ermalinda Barrios, PA-C

## 2018-07-25 DIAGNOSIS — G4733 Obstructive sleep apnea (adult) (pediatric): Secondary | ICD-10-CM | POA: Diagnosis not present

## 2018-08-07 ENCOUNTER — Other Ambulatory Visit: Payer: Self-pay

## 2018-08-07 ENCOUNTER — Ambulatory Visit (HOSPITAL_COMMUNITY): Payer: Medicare HMO | Attending: Cardiology

## 2018-08-07 DIAGNOSIS — R079 Chest pain, unspecified: Secondary | ICD-10-CM | POA: Diagnosis not present

## 2018-08-12 NOTE — Progress Notes (Signed)
Cardiology Office Note    Date:  08/14/2018  ID:  Samantha Huffman, DOB 16-Feb-1956, MRN [id number] PCP:  Alroy Dust, Carlean Jews.Marlou Sa, MD  Cardiologist:  Fransico Him, MD   Chief Complaint: f/u chest discomfort  History of Present Illness:  Samantha Huffman is a 62 y.o. female with history of CAD s/p CABG 10/2012, dyslipidemia, HTN, DM, mild carotid artery stenosis, possible seizures, OSA on CPAP (folloewd by Dr. Radford Pax), migraines, prior pulm HTN (resolved on 2014 echo) who presents to f/u chest tightness.  Her last cath was in 2017, following a nuclear stress test that showed ischemia but cath showed patent grafts to OM and LAD and 30% RCA, normal LVEF - discomfort was noncardiac. She still gets some chest discomfort when she gets very upset. In 07/2016, Dr. Radford Pax added low dose Imdur in case this represented microvascular anginal disease. She also recommended for patient to f/u PCP to discuss treatent of anxiety. Repeat nuc 11/2017 for similar symptoms showed normal perfusion. When she returned for follow-up with Dr. Radford Pax 06/2018 she was very tearful and reporting more chest discomfort with emotional upset as well as dyspnea on exertion. Imdur was increased to 120mg  daily. 2D echo 07/2018 showed EF 60-65%, grade 1 DD. Last labs: 05/2018 LDL 67, 04/2018 BUN 15, Cr 1.01, K 4.3, A1c 8.0, 12/2017 LFTS wnl, 10/2017 Hgb 11.9. Otherwise, last carotid duplex 12/2017 showed 1-39% BICA, normal subclavians.  She returns for follow-up in much better spirits. She is eager for Christmas. She continues to have same chest discomfort with emotional stress but it sounds like she's in a better place mentally. She also noticed reproduction of this chest discomfort with the echo ultrasound probe pushing on the area. She has had chronic dyspnea on exertion but no exertional angina. BP remains slightly elevated. Notes indicate amlodipine was previously stopped due to lower extremity edema. She rarely takes Lasix.   Past Medical History:    Diagnosis Date  . Carotid artery stenosis    1-39% bilateral stenosis by dopplers 12/2017  . Coronary artery disease    a. s/p CABG 10/2012.  cath 05/2016 with moderate LM stenosis, mild LCx and RCA stenosis and occluded LAD with patent LIMA to LAD, free radial to OM.  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  Marland Kitchen Dyslipidemia   . Generalized convulsive epilepsy without mention of intractable epilepsy   . Hypertension   . Memory deficit   . Migraine    "controlled on daily RX " (06/11/2016)  . Nephrolithiasis 07/30/2015   S/p lithotripsy x 3 and right and left ureter stents  . OSA (obstructive sleep apnea)    hx; was on CPAP; haven't needed it since OHS" (06/11/2016)  . PONV (postoperative nausea and vomiting)   . Seizures ([place])    "controlled w/daily RX; started in 2012; dr thinks they might be from the migraines; last sz was early part of 2016" (06/11/2016)  . Type II diabetes mellitus ([place])    diet controlled (06/11/2016)  . Vasovagal syncope 07/30/2015   2011    Past Surgical History:  Procedure Laterality Date  . CARDIAC CATHETERIZATION  2014; 06/11/2016  . CARDIAC CATHETERIZATION N/A 06/11/2016   Procedure: Left Heart Cath and Cors/Grafts Angiography;  Surgeon: Sherren Mocha, MD;  Location: [facility];  Service: Cardiovascular;  Laterality: N/A;  . CESAREAN SECTION    . CORONARY ANGIOPLASTY    . CORONARY ARTERY BYPASS GRAFT  March, 2014   LIMA to LAD, left radial to LCx  . LITHOTRIPSY  X 3  . TOTAL ABDOMINAL HYSTERECTOMY    . TUBAL LIGATION    . URETERAL STENT PLACEMENT      Current Medications: Current Meds  Medication Sig  . acetaminophen (TYLENOL) 325 MG tablet Take 2 tablets (650 mg total) by mouth every 4 (four) hours as needed for headache or mild pain.  Marland Kitchen ALPRAZolam (XANAX) 0.25 MG tablet TAKE 1 TABLET BY MOUTH 3 TIMES A DAY AS NEEDED FOR ANXIETY  . aspirin 81 MG tablet Take 81 mg by mouth daily.  Marland Kitchen atorvastatin (LIPITOR) 80 MG tablet Take 1 tablet (80  mg total) by mouth daily.  . clopidogrel (PLAVIX) 75 MG tablet TAKE ONE TABLET BY MOUTH DAILY  . escitalopram (LEXAPRO) 10 MG tablet Take 10 mg by mouth daily.  Marland Kitchen ezetimibe (ZETIA) 10 MG tablet TAKE 1 TABLET (10 MG TOTAL) BY MOUTH DAILY.  . isosorbide mononitrate (IMDUR) 120 MG 24 hr tablet Take 1 tablet (120 mg total) by mouth daily.  Marland Kitchen levETIRAcetam (KEPPRA) 500 MG tablet TAKE 1 TABLET (500 MG TOTAL) BY MOUTH 2 (TWO) TIMES DAILY.  Marland Kitchen lisinopril (PRINIVIL,ZESTRIL) 20 MG tablet Take 1 tablet (20 mg total) by mouth daily.  . metFORMIN (GLUCOPHAGE-XR) 500 MG 24 hr tablet Take 500 mg by mouth daily.  . metoprolol succinate (TOPROL-XL) 50 MG 24 hr tablet Take 1 tablet (50 mg total) by mouth daily. Take with or immediately following a meal.  . nitroGLYCERIN (NITROSTAT) 0.4 MG SL tablet Place 1 tablet (0.4 mg total) under the tongue every 5 (five) minutes x 3 doses as needed for chest pain.  . pantoprazole (PROTONIX) 40 MG tablet Take 1 tablet (40 mg total) by mouth daily.  Marland Kitchen topiramate (TOPAMAX) 25 MG tablet Take 3 tablets (75 mg total) by mouth 2 (two) times daily.      Allergies:   Depakote [divalproex sodium]; Anesthesia s-i-60; and Penicillins   Social History   Socioeconomic History  . Marital status: Divorced    Spouse name: Not on file  . Number of children: 2  . Years of education: 71  . Highest education level: Not on file  Occupational History  . Occupation: retired  Scientific laboratory technician  . Financial resource strain: Not on file  . Food insecurity:    Worry: Not on file    Inability: Not on file  . Transportation needs:    Medical: Not on file    Non-medical: Not on file  Tobacco Use  . Smoking status: Current Some Day Smoker    Packs/day: 0.33    Years: 35.00    Pack years: 11.55    Types: Cigarettes    Last attempt to quit: 06/11/2016    Years since quitting: 2.1  . Smokeless tobacco: Former Systems developer    Quit date: 11/20/2012  Substance and Sexual Activity  . Alcohol use: Yes     Comment: 06/11/2016 "might have a few drinks/month; usually wine"  . Drug use: No  . Sexual activity: Not Currently  Lifestyle  . Physical activity:    Days per week: Not on file    Minutes per session: Not on file  . Stress: Not on file  Relationships  . Social connections:    Talks on phone: Not on file    Gets together: Not on file    Attends religious service: Not on file    Active member of club or organization: Not on file    Attends meetings of clubs or organizations: Not on file  Relationship status: Not on file  Other Topics Concern  . Not on file  Social History Narrative   Patient drinks about 1 cup of coffee daily.   Patient is right handed.      Family History:  The patient's family history includes Cancer in her father; Diabetes in her brother, brother, and father; Heart attack in her sister; Heart disease in her brother and sister; Stroke in her brother.  ROS:   Please see the history of present illness.  + fatigue, some difficulty with memory as well All other systems are reviewed and otherwise negative.    PHYSICAL EXAM:   VS:  BP (!) 144/48   Pulse 79   Ht 5\' 1"  (1.549 m)   Wt 151 lb 6.4 oz (68.7 kg)   SpO2 98%   BMI 28.61 kg/m   BMI: Body mass index is 28.61 kg/m. GEN: Well nourished, well developed AAF, in no acute distress HEENT: normocephalic, atraumatic Neck: no JVD, carotid bruits, or masses Cardiac: RRR; no murmurs, rubs, or gallops, no edema  Respiratory:  clear to auscultation bilaterally, normal work of breathing GI: soft, nontender, nondistended, + BS MS: no deformity or atrophy Skin: warm and dry, no rash Neuro:  Alert and Oriented x 3, Strength and sensation are intact, follows commands Psych: euthymic mood, full affect  Wt Readings from Last 3 Encounters:  08/14/18 151 lb 6.4 oz (68.7 kg)  07/12/18 149 lb (67.6 kg)  04/04/18 151 lb (68.5 kg)      Studies/Labs Reviewed:   EKG:  EKG was ordered today and personally reviewed  by me and demonstrates NSR 73bpm, inferior TWI as well as V3-V6, similar to prior tracing  Recent Labs: 11/08/2017: BUN 10; Creatinine, Ser 0.98; Hemoglobin 11.9; Platelets 190; Potassium 3.8; Sodium 139 01/19/2018: ALT 25   Lipid Panel    Component Value Date/Time   CHOL 124 06/13/2018 1517   TRIG 69 06/13/2018 1517   HDL 48 06/13/2018 1517   CHOLHDL 2.6 06/13/2018 1517   CHOLHDL 3.6 01/19/2018 0000   VLDL 17 06/11/2016 0521   LDLCALC 62 06/13/2018 1517   LDLCALC 103 (H) 01/19/2018 0000   LDLDIRECT 67 06/13/2018 1517    Additional studies/ records that were reviewed today include: Summarized above   ASSESSMENT & PLAN:   1. CAD with chronic stable angina - symptom quality are about the same with titration of Imdur. Fortunately she is in a better place mentally and she is able to achieve quiet peace to resolve the episodes. Nuclear stress test 11/2017 was unremarkable and echo showed normal LV function without wall motion abnormalities. This pain is similar to what prompted cath in 2017 except back then she also had exertional angina which is no longer present. Some of her features of chest discomfort are atypical such as provoked only by emotional stress or firm palpation (such as from echocardiogram probe). EKG does not show significant change from prior. I think she would benefit from optimization of medical therapy with blood pressure as this seems suboptimal in recent visits. I will check labs today given fatigue, dyspnea, diastolic dysfunction and anticipate changing metoprolol to carvedilol for improved BP effect. Amlodipine could be re-considered, but this was previously stopped due to edema. I told her to let us know how she is feeling after anticipate med change. If symptoms continue to persist, may need to consider repeat cath although most recent noninvasive testing this year for similar symptoms was reassuring. Warning sx reviewed with patient. 2. Essential  HTN - anticipate  adjusting meds after labs are back. Of note, the patient wants her results also called to her daughter [name] as she sometimes has difficulty relaying all the details of the medical visit after the fact. 3. Hyperlipidemia - recent LDL looked great. 4. Mild carotid disease - will defer timing of repeat assessment to Dr. Radford Pax. 5. Fatigue - as above.  Disposition: F/u with Dr. Radford Pax in 4 months.  Medication Adjustments/Labs and Tests Ordered: Current medicines are reviewed at length with the patient today.  Concerns regarding medicines are outlined above. Medication changes, Labs and Tests ordered today are summarized above and listed in the Patient Instructions accessible in Encounters.   Signed, Charlie Pitter, PA-C  08/14/2018 9:34 AM    [address], [place], [place]  [id number] Phone: [phone]; Fax: [phone]

## 2018-08-14 ENCOUNTER — Encounter: Payer: Self-pay | Admitting: Physician Assistant

## 2018-08-14 ENCOUNTER — Ambulatory Visit: Payer: Medicare HMO | Admitting: Physician Assistant

## 2018-08-14 VITALS — BP 144/48 | HR 79 | Ht 61.0 in | Wt 151.4 lb

## 2018-08-14 DIAGNOSIS — E785 Hyperlipidemia, unspecified: Secondary | ICD-10-CM | POA: Diagnosis not present

## 2018-08-14 DIAGNOSIS — I25118 Atherosclerotic heart disease of native coronary artery with other forms of angina pectoris: Secondary | ICD-10-CM | POA: Diagnosis not present

## 2018-08-14 DIAGNOSIS — R0609 Other forms of dyspnea: Secondary | ICD-10-CM | POA: Diagnosis not present

## 2018-08-14 DIAGNOSIS — I10 Essential (primary) hypertension: Secondary | ICD-10-CM | POA: Diagnosis not present

## 2018-08-14 DIAGNOSIS — R5383 Other fatigue: Secondary | ICD-10-CM

## 2018-08-14 DIAGNOSIS — I779 Disorder of arteries and arterioles, unspecified: Secondary | ICD-10-CM

## 2018-08-14 DIAGNOSIS — I739 Peripheral vascular disease, unspecified: Secondary | ICD-10-CM | POA: Diagnosis not present

## 2018-08-14 DIAGNOSIS — R0600 Dyspnea, unspecified: Secondary | ICD-10-CM

## 2018-08-14 NOTE — Patient Instructions (Signed)
Medication Instructions:  Your physician recommends that you continue on your current medications as directed. Please refer to the Current Medication list given to you today.  If you need a refill on your cardiac medications before your next appointment, please call your pharmacy.   Lab work: TODAY:  BMET, CBC, TSH, & PRO BNP  If you have labs (blood work) drawn today and your tests are completely normal, you will receive your results only by: Marland Kitchen MyChart Message (if you have MyChart) OR . A paper copy in the mail If you have any lab test that is abnormal or we need to change your treatment, we will call you to review the results.  Testing/Procedures: None ordered  Follow-Up: Your physician recommends that you schedule a follow-up appointment in: 4 MONTHS WITH DR. Radford Pax  Any Other Special Instructions Will Be Listed Below (If Applicable).

## 2018-08-15 ENCOUNTER — Telehealth: Payer: Self-pay | Admitting: *Deleted

## 2018-08-15 LAB — BASIC METABOLIC PANEL
BUN / CREAT RATIO: 11 — AB (ref 12–28)
BUN: 10 mg/dL (ref 8–27)
CHLORIDE: 103 mmol/L (ref 96–106)
CO2: 20 mmol/L (ref 20–29)
Calcium: 9.6 mg/dL (ref 8.7–10.3)
Creatinine, Ser: 0.92 mg/dL (ref 0.57–1.00)
GFR calc Af Amer: 77 mL/min/{1.73_m2} (ref >59)
GFR calc non Af Amer: 67 mL/min/{1.73_m2} (ref >59)
GLUCOSE: 145 mg/dL — AB (ref 65–99)
Potassium: 4.6 mmol/L (ref 3.5–5.2)
SODIUM: 138 mmol/L (ref 134–144)

## 2018-08-15 LAB — CBC
Hematocrit: 38.8 % (ref 34.0–46.6)
Hemoglobin: 13 g/dL (ref 11.1–15.9)
MCH: 31.3 pg (ref 26.6–33.0)
MCHC: 33.5 g/dL (ref 31.5–35.7)
MCV: 93 fL (ref 79–97)
PLATELETS: 223 10*3/uL (ref 150–450)
RBC: 4.16 x10E6/uL (ref 3.77–5.28)
RDW: 13.1 % (ref 12.3–15.4)
WBC: 8.4 10*3/uL (ref 3.4–10.8)

## 2018-08-15 LAB — PRO B NATRIURETIC PEPTIDE: NT-PRO BNP: 101 pg/mL (ref 0–287)

## 2018-08-15 LAB — TSH: TSH: 0.959 u[IU]/mL (ref 0.450–4.500)

## 2018-08-15 MED ORDER — CARVEDILOL 12.5 MG PO TABS: 12.5000 mg | ORAL_TABLET | Freq: Two times a day (BID) | ORAL | 3 refills | Status: DC

## 2018-08-15 NOTE — Telephone Encounter (Signed)
-----   Message from Charlie Pitter, PA-C sent at 08/15/2018  8:00 AM EST ----- Please call patient. Labs show elevated blood sugar c/w known diabetes, normal blood count, normal thyroid and totally normal heart failure marker.  She wanted nurse to also call results of labs to her daughter [name], as well as review echo result which showed normal EF, only some mild diastolic dysfunction (stiffening of heart muscle likely related to high BP).  She needs better BP control. Let's stop Metoprolol and switch to carvedilol 12.5mg  BID with nurse BP check in approximately 1 week. Dayna Dunn PA-C

## 2018-08-18 NOTE — Telephone Encounter (Signed)
Returned pts call and left her a detailed message that we have already spoken with Samantha Huffman, her daughter, per her request, and if she needed any additional information, she can call us back.

## 2018-08-18 NOTE — Telephone Encounter (Signed)
Patient returning call.

## 2018-08-24 ENCOUNTER — Ambulatory Visit (INDEPENDENT_AMBULATORY_CARE_PROVIDER_SITE_OTHER): Payer: Medicare HMO

## 2018-08-24 VITALS — BP 155/76 | HR 66 | Ht 61.0 in | Wt 152.6 lb

## 2018-08-24 DIAGNOSIS — I10 Essential (primary) hypertension: Secondary | ICD-10-CM | POA: Diagnosis not present

## 2018-08-24 MED ORDER — CHLORTHALIDONE 25 MG PO TABS: 12.5000 mg | ORAL_TABLET | Freq: Every day | ORAL | 11 refills | Status: DC

## 2018-08-24 NOTE — Progress Notes (Signed)
Please start chlorthalidone 12.5mg  daily and bring back for OV in a week to review BP and see how she is feeling. If symptoms persist may need to consider R/LHC. Ravin Bendall PA-C

## 2018-08-24 NOTE — Addendum Note (Signed)
Addended by: Sarina Ill on: 08/24/2018 05:23 PM   Modules accepted: Orders

## 2018-08-24 NOTE — Progress Notes (Signed)
1.) Reason for visit: BP check  2.) Name of MD requesting visit: Dayna Dunn PA-C  3.) H&P: Hypertension, CAD and OSA  4.) ROS related to problem: Patient has been fatigued  5.) Assessment and plan per MD: The patient has been fatigued and weak. She was SOB walking from the lobby to the room. The patient took a minute to catch her breath. She denies chest pain. She said that she has strong family history for cardiac problems. She stated her SOB and weakness has been occurring since her cath in 2017. She said it was not any worse. Spoke [employer], she stated she would review the patient and then make a decision at a later time.

## 2018-08-24 NOTE — Progress Notes (Signed)
Spoke with the patient, she expressed understanding about starting chlorthalidone 12.5 mg, daily and is scheduled for a 1 week follow up with Melina Copa on 1/13 at 12 pm.

## 2018-08-25 DIAGNOSIS — G4733 Obstructive sleep apnea (adult) (pediatric): Secondary | ICD-10-CM | POA: Diagnosis not present

## 2018-09-01 DIAGNOSIS — N200 Calculus of kidney: Secondary | ICD-10-CM | POA: Diagnosis not present

## 2018-09-01 NOTE — Progress Notes (Signed)
Cardiology Office Note    Date:  09/04/2018  ID:  Samantha Huffman, DOB [birth year], MRN [id number] PCP:  Alroy Dust, Carlean Jews.Marlou Sa, MD  Cardiologist:  Fransico Him, MD   Chief Complaint: f/u SOB, HTN  History of Present Illness:  Samantha Huffman is a 63 y.o. female with history of CAD s/p CABG 10/2012, dyslipidemia, HTN, DM, mild carotid artery stenosis (1-39% in 12/2017), possible seizures, OSA on CPAP (followed by Dr. Radford Pax), migraines, prior pulm HTN (resolved on 2014 echo) who presents to f/u SOB and HTN.  She had bypass surgery in [place]. Per records, she had a normal stress test in 2017 that showed ischemia but cath showed patent grafts to OM and LAD and 30% RCA, normal LVEF - discomfort was felt noncardiac at that time. Since that time she's been followed for periodic chest discomfort under emotional upset. In 07/2016, Dr. Radford Pax added Imdur in case this represented microvascular anginal disease. She also recommended for patient to f/u PCP to discuss treatent of anxiety. Repeat nuc 11/2017 for similar symptoms showed normal perfusion. When she returned for follow-up with Dr. Radford Pax 06/2018 she was very tearful and reporting more chest discomfort with emotional upset as well as dyspnea on exertion. Imdur was increased to 120mg  daily. 2D echo 07/2018 showed EF 60-65%, grade 1 DD.   I saw her 08/14/18 to follow-up symptoms. She was in a happier state of mind with the holidays. She did report continued intermittent atypical chest discomfort (but this also included reproduction of pain with the echo probe pushing on the area). However, despite Imdur titration since earlier in 2019, she also reported continued significant exertional dyspnea as well as diaphoresis with activities such as making the bed. Her blood pressure was poorly controlled. I stopped metoprolol and changed this to carvedilol. F/u BP check was still uncontrolled so chlorthalidone 12.5mg  was added.  She returns for follow-up today and is  actually borderline hypotensive - apparently she thought the chlorthalidone was too small to cut so has just been taking a whole tablet. She reports her chest tightness has actually improved with the BP control but she continues to be bothered by dyspnea on exertion requiring her to stop frequently, even from the parking lot. This is very troubling to her. She also reports diaphoresis with exertion. It is challenging trying to sort out the chronicity of this symptom and its progression. Samantha Huffman to reflect on these questions - she just says she feels like something is wrong deep down. Last labs showed K 4.6, Cr 0.92, Hgb 13, TSH wnl, pBNP 101, 05/2018 LDL 67.   Past Medical History:  Diagnosis Date  . Carotid artery stenosis    1-39% bilateral stenosis by dopplers 12/2017  . Coronary artery disease    a. s/p CABG 10/2012.  cath 05/2016 with moderate LM stenosis, mild LCx and RCA stenosis and occluded LAD with patent LIMA to LAD, free radial to OM.  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  Marland Kitchen Dyslipidemia   . Generalized convulsive epilepsy without mention of intractable epilepsy   . Hypertension   . Memory deficit   . Migraine    "controlled on daily RX " (06/11/2016)  . Nephrolithiasis 07/30/2015   S/p lithotripsy x 3 and right and left ureter stents  . OSA (obstructive sleep apnea)    hx; was on CPAP; haven't needed it since OHS" (06/11/2016)  . PONV (postoperative nausea and vomiting)   . Seizures ([place])    "controlled w/daily RX; started in  2012; dr thinks they might be from the migraines; last sz was early part of 2016" (06/11/2016)  . Type II diabetes mellitus ([place])    diet controlled (06/11/2016)  . Vasovagal syncope 07/30/2015   2011    Past Surgical History:  Procedure Laterality Date  . CARDIAC CATHETERIZATION  2014; 06/11/2016  . CARDIAC CATHETERIZATION N/A 06/11/2016   Procedure: Left Heart Cath and Cors/Grafts Angiography;  Surgeon: Sherren Mocha, MD;  Location: [facility];  Service: Cardiovascular;  Laterality: N/A;  . CESAREAN SECTION    . CORONARY ANGIOPLASTY    . CORONARY ARTERY BYPASS GRAFT  March, 2014   LIMA to LAD, left radial to LCx  . LITHOTRIPSY  X 3  . TOTAL ABDOMINAL HYSTERECTOMY    . TUBAL LIGATION    . URETERAL STENT PLACEMENT      Current Medications: Current Meds  Medication Sig  . acetaminophen (TYLENOL) 325 MG tablet Take 2 tablets (650 mg total) by mouth every 4 (four) hours as needed for headache or mild pain.  Marland Kitchen ALPRAZolam (XANAX) 0.25 MG tablet TAKE 1 TABLET BY MOUTH 3 TIMES A DAY AS NEEDED FOR ANXIETY  . aspirin 81 MG tablet Take 81 mg by mouth daily.  Marland Kitchen atorvastatin (LIPITOR) 80 MG tablet Take 1 tablet (80 mg total) by mouth daily.  . carvedilol (COREG) 12.5 MG tablet Take 1 tablet (12.5 mg total) by mouth 2 (two) times daily.  . chlorthalidone (HYGROTON) 25 MG tablet Take 0.5 tablets (12.5 mg total) by mouth daily.  . clopidogrel (PLAVIX) 75 MG tablet TAKE ONE TABLET BY MOUTH DAILY  . escitalopram (LEXAPRO) 10 MG tablet Take 10 mg by mouth daily.  Marland Kitchen ezetimibe (ZETIA) 10 MG tablet TAKE 1 TABLET (10 MG TOTAL) BY MOUTH DAILY.  . furosemide (LASIX) 20 MG tablet Take 20 mg by mouth as needed for fluid.  . isosorbide mononitrate (IMDUR) 120 MG 24 hr tablet Take 1 tablet (120 mg total) by mouth daily.  Marland Kitchen levETIRAcetam (KEPPRA) 500 MG tablet TAKE 1 TABLET (500 MG TOTAL) BY MOUTH 2 (TWO) TIMES DAILY.  Marland Kitchen lisinopril (PRINIVIL,ZESTRIL) 20 MG tablet Take 1 tablet (20 mg total) by mouth daily.  . metFORMIN (GLUCOPHAGE-XR) 500 MG 24 hr tablet Take 500 mg by mouth daily.  . nitroGLYCERIN (NITROSTAT) 0.4 MG SL tablet Place 1 tablet (0.4 mg total) under the tongue every 5 (five) minutes x 3 doses as needed for chest pain.  . pantoprazole (PROTONIX) 40 MG tablet Take 1 tablet (40 mg total) by mouth daily.  Marland Kitchen topiramate (TOPAMAX) 25 MG tablet Take 3 tablets (75 mg total) by mouth 2 (two) times daily.      Allergies:   Depakote  [divalproex sodium]; Anesthesia s-i-60; and Penicillins   Social History   Socioeconomic History  . Marital status: Divorced    Spouse name: Not on file  . Number of children: 2  . Years of education: 68  . Highest education level: Not on file  Occupational History  . Occupation: retired  Scientific laboratory technician  . Financial resource strain: Not on file  . Food insecurity:    Worry: Not on file    Inability: Not on file  . Transportation needs:    Medical: Not on file    Non-medical: Not on file  Tobacco Use  . Smoking status: Current Some Day Smoker    Packs/day: 0.33    Years: 35.00    Pack years: 11.55    Types: Cigarettes  Last attempt to quit: 06/11/2016    Years since quitting: 2.2  . Smokeless tobacco: Former Systems developer    Quit date: 11/20/2012  Substance and Sexual Activity  . Alcohol use: Yes    Comment: 06/11/2016 "might have a few drinks/month; usually wine"  . Drug use: No  . Sexual activity: Not Currently  Lifestyle  . Physical activity:    Days per week: Not on file    Minutes per session: Not on file  . Stress: Not on file  Relationships  . Social connections:    Talks on phone: Not on file    Gets together: Not on file    Attends religious service: Not on file    Active member of club or organization: Not on file    Attends meetings of clubs or organizations: Not on file    Relationship status: Not on file  Other Topics Concern  . Not on file  Social History Narrative   Patient drinks about 1 cup of coffee daily.   Patient is right handed.      Family History:  The patient's family history includes Cancer in her father; Diabetes in her brother, brother, and father; Heart attack in her sister; Heart disease in her brother and sister; Stroke in her brother.  ROS:   Please see the history of present illness.  All other systems are reviewed and otherwise negative.    PHYSICAL EXAM:   VS:  BP (!) 98/54   Pulse 72   Ht 5\' 1"  (1.549 m)   Wt 153 lb 12.8 oz  (69.8 kg)   SpO2 94%   BMI 29.06 kg/m   BMI: Body mass index is 29.06 kg/m. GEN: Well nourished, well developed AAF, in no acute distress HEENT: normocephalic, atraumatic Neck: no JVD, carotid bruits, or masses Cardiac: RRR; no murmurs, rubs, or gallops, no edema  Respiratory:  clear to auscultation bilaterally, normal work of breathing GI: soft, nontender, nondistended, + BS MS: no deformity or atrophy Skin: warm and dry, no rash Neuro:  Alert and Oriented x 3, Strength and sensation are intact, follows commands Psych: euthymic mood, full affect  Wt Readings from Last 3 Encounters:  09/04/18 153 lb 12.8 oz (69.8 kg)  08/24/18 152 lb 9.6 oz (69.2 kg)  08/14/18 151 lb 6.4 oz (68.7 kg)      Studies/Labs Reviewed:   EKG:  EKG was not ordered today. Reviewed from 08/14/18.  Recent Labs: 01/19/2018: ALT 25 08/14/2018: BUN 10; Creatinine, Ser 0.92; Hemoglobin 13.0; NT-Pro BNP 101; Platelets 223; Potassium 4.6; Sodium 138; TSH 0.959   Lipid Panel    Component Value Date/Time   CHOL 124 06/13/2018 1517   TRIG 69 06/13/2018 1517   HDL 48 06/13/2018 1517   CHOLHDL 2.6 06/13/2018 1517   CHOLHDL 3.6 01/19/2018 0000   VLDL 17 06/11/2016 0521   LDLCALC 62 06/13/2018 1517   LDLCALC 103 (H) 01/19/2018 0000   LDLDIRECT 67 06/13/2018 1517    Additional studies/ records that were reviewed today include: Summarized above   ASSESSMENT & PLAN:   1. Exertional dyspnea - the patient continues to report exertional dyspnea as well as sweating with activities of walking or making the bed despite anti-anginal titration and BP control. Noninvasive testing earlier this year was unrevealing. Cardiac CT would be unhelpful given her history of bypass grafting. She tells me today she feels as though something is not right. We walked patient in clinic today and she did get SOB with exertion. Pulse  ox remained normal. She is worried stress testing might miss something serious. At this point would  suggest right and left heart cath to definitively exclude cardiac etiology of her dyspnea. I reviewed with Dr. Irish Lack (DOD) in Dr. Theodosia Blender absence who agrees. Risks and benefits of cardiac catheterization have been discussed with the patient.  These include bleeding, infection, kidney damage, stroke, heart attack, death.  The patient understands these risks and is willing to proceed. Hold Metformin prior to study and 48 hours after. Check BMET, CBC today. I do suspect there is underlying deconditioning so if cath is unrevealing would encourage she join a monitored exercise program. Would also suggest referral to pulmonology if cath without significant findings. She wants to do cath on Friday. ER precautions reviewed. 2. CAD with h/o atypical angina - her chest pain has been quite atypical in the past, with possible microvascular component versus anxiety. Continue ASA, Plavix, BB, statin. See above. 3. Essential HTN - chlorthalidone really made the most profound difference in her BP recently. BP is actually on the soft side today. She has been taking chlorthalidone 25mg  once daily instead fo 12.5mg  as advised. She will get a pill cutter and see if she can cut the tablets in half. If not, I asked her to cut her lisinopril in half instead. Check BMET today. 4. Mild carotid disease - continue to follow clinically for now. Will defer to Dr. Radford Pax to decide timing of repeat study.  Disposition: F/u with Dr. Betsy Pries team in 2-3 weeks post-cath.  Medication Adjustments/Labs and Tests Ordered: Current medicines are reviewed at length with the patient today.  Concerns regarding medicines are outlined above. Medication changes, Labs and Tests ordered today are summarized above and listed in the Patient Instructions accessible in Encounters.   Signed, Charlie Pitter, PA-C  09/04/2018 12:31 PM    [facility] [place], [place], [place]  [address] Phone: [phone]; Fax: [phone]

## 2018-09-01 NOTE — H&P (View-Only) (Signed)
Cardiology Office Note    Date:  09/04/2018  ID:  Samantha Huffman, DOB 26-Aug-1955, MRN [id number] PCP:  Alroy Dust, Carlean Jews.Marlou Sa, MD  Cardiologist:  Fransico Him, MD   Chief Complaint: f/u SOB, HTN  History of Present Illness:  Samantha Huffman is a 63 y.o. female with history of CAD s/p CABG 10/2012, dyslipidemia, HTN, DM, mild carotid artery stenosis (1-39% in 12/2017), possible seizures, OSA on CPAP (followed by Dr. Radford Pax), migraines, prior pulm HTN (resolved on 2014 echo) who presents to f/u SOB and HTN.  She had bypass surgery in [place]. Per records, she had a normal stress test in 2017 that showed ischemia but cath showed patent grafts to OM and LAD and 30% RCA, normal LVEF - discomfort was felt noncardiac at that time. Since that time she's been followed for periodic chest discomfort under emotional upset. In 07/2016, Dr. Radford Pax added Imdur in case this represented microvascular anginal disease. She also recommended for patient to f/u PCP to discuss treatent of anxiety. Repeat nuc 11/2017 for similar symptoms showed normal perfusion. When she returned for follow-up with Dr. Radford Pax 06/2018 she was very tearful and reporting more chest discomfort with emotional upset as well as dyspnea on exertion. Imdur was increased to 120mg  daily. 2D echo 07/2018 showed EF 60-65%, grade 1 DD.   I saw her 08/14/18 to follow-up symptoms. She was in a happier state of mind with the holidays. She did report continued intermittent atypical chest discomfort (but this also included reproduction of pain with the echo probe pushing on the area). However, despite Imdur titration since earlier in 2019, she also reported continued significant exertional dyspnea as well as diaphoresis with activities such as making the bed. Her blood pressure was poorly controlled. I stopped metoprolol and changed this to carvedilol. F/u BP check was still uncontrolled so chlorthalidone 12.5mg  was added.  She returns for follow-up today and is  actually borderline hypotensive - apparently she thought the chlorthalidone was too small to cut so has just been taking a whole tablet. She reports her chest tightness has actually improved with the BP control but she continues to be bothered by dyspnea on exertion requiring her to stop frequently, even from the parking lot. This is very troubling to her. She also reports diaphoresis with exertion. It is challenging trying to sort out the chronicity of this symptom and its progression. Samantha Huffman pauses to reflect on these questions - she just says she feels like something is wrong deep down. Last labs showed K 4.6, Cr 0.92, Hgb 13, TSH wnl, pBNP 101, 05/2018 LDL 67.   Past Medical History:  Diagnosis Date  . Carotid artery stenosis    1-39% bilateral stenosis by dopplers 12/2017  . Coronary artery disease    a. s/p CABG 10/2012.  cath 05/2016 with moderate LM stenosis, mild LCx and RCA stenosis and occluded LAD with patent LIMA to LAD, free radial to OM.  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  Marland Kitchen Dyslipidemia   . Generalized convulsive epilepsy without mention of intractable epilepsy   . Hypertension   . Memory deficit   . Migraine    "controlled on daily RX " (06/11/2016)  . Nephrolithiasis 07/30/2015   S/p lithotripsy x 3 and right and left ureter stents  . OSA (obstructive sleep apnea)    hx; was on CPAP; haven't needed it since OHS" (06/11/2016)  . PONV (postoperative nausea and vomiting)   . Seizures ([place])    "controlled w/daily RX; started in  2012; dr thinks they might be from the migraines; last sz was early part of 2016" (06/11/2016)  . Type II diabetes mellitus ([place])    diet controlled (06/11/2016)  . Vasovagal syncope 07/30/2015   2011    Past Surgical History:  Procedure Laterality Date  . CARDIAC CATHETERIZATION  2014; 06/11/2016  . CARDIAC CATHETERIZATION N/A 06/11/2016   Procedure: Left Heart Cath and Cors/Grafts Angiography;  Surgeon: Sherren Mocha, MD;  Location: [facility];  Service: Cardiovascular;  Laterality: N/A;  . CESAREAN SECTION    . CORONARY ANGIOPLASTY    . CORONARY ARTERY BYPASS GRAFT  March, 2014   LIMA to LAD, left radial to LCx  . LITHOTRIPSY  X 3  . TOTAL ABDOMINAL HYSTERECTOMY    . TUBAL LIGATION    . URETERAL STENT PLACEMENT      Current Medications: Current Meds  Medication Sig  . acetaminophen (TYLENOL) 325 MG tablet Take 2 tablets (650 mg total) by mouth every 4 (four) hours as needed for headache or mild pain.  Marland Kitchen ALPRAZolam (XANAX) 0.25 MG tablet TAKE 1 TABLET BY MOUTH 3 TIMES A DAY AS NEEDED FOR ANXIETY  . aspirin 81 MG tablet Take 81 mg by mouth daily.  Marland Kitchen atorvastatin (LIPITOR) 80 MG tablet Take 1 tablet (80 mg total) by mouth daily.  . carvedilol (COREG) 12.5 MG tablet Take 1 tablet (12.5 mg total) by mouth 2 (two) times daily.  . chlorthalidone (HYGROTON) 25 MG tablet Take 0.5 tablets (12.5 mg total) by mouth daily.  . clopidogrel (PLAVIX) 75 MG tablet TAKE ONE TABLET BY MOUTH DAILY  . escitalopram (LEXAPRO) 10 MG tablet Take 10 mg by mouth daily.  Marland Kitchen ezetimibe (ZETIA) 10 MG tablet TAKE 1 TABLET (10 MG TOTAL) BY MOUTH DAILY.  . furosemide (LASIX) 20 MG tablet Take 20 mg by mouth as needed for fluid.  . isosorbide mononitrate (IMDUR) 120 MG 24 hr tablet Take 1 tablet (120 mg total) by mouth daily.  Marland Kitchen levETIRAcetam (KEPPRA) 500 MG tablet TAKE 1 TABLET (500 MG TOTAL) BY MOUTH 2 (TWO) TIMES DAILY.  Marland Kitchen lisinopril (PRINIVIL,ZESTRIL) 20 MG tablet Take 1 tablet (20 mg total) by mouth daily.  . metFORMIN (GLUCOPHAGE-XR) 500 MG 24 hr tablet Take 500 mg by mouth daily.  . nitroGLYCERIN (NITROSTAT) 0.4 MG SL tablet Place 1 tablet (0.4 mg total) under the tongue every 5 (five) minutes x 3 doses as needed for chest pain.  . pantoprazole (PROTONIX) 40 MG tablet Take 1 tablet (40 mg total) by mouth daily.  Marland Kitchen topiramate (TOPAMAX) 25 MG tablet Take 3 tablets (75 mg total) by mouth 2 (two) times daily.      Allergies:   Depakote  [divalproex sodium]; Anesthesia s-i-60; and Penicillins   Social History   Socioeconomic History  . Marital status: Divorced    Spouse name: Not on file  . Number of children: 2  . Years of education: 19  . Highest education level: Not on file  Occupational History  . Occupation: retired  Scientific laboratory technician  . Financial resource strain: Not on file  . Food insecurity:    Worry: Not on file    Inability: Not on file  . Transportation needs:    Medical: Not on file    Non-medical: Not on file  Tobacco Use  . Smoking status: Current Some Day Smoker    Packs/day: 0.33    Years: 35.00    Pack years: 11.55    Types: Cigarettes  Last attempt to quit: 06/11/2016    Years since quitting: 2.2  . Smokeless tobacco: Former Systems developer    Quit date: 11/20/2012  Substance and Sexual Activity  . Alcohol use: Yes    Comment: 06/11/2016 "might have a few drinks/month; usually wine"  . Drug use: No  . Sexual activity: Not Currently  Lifestyle  . Physical activity:    Days per week: Not on file    Minutes per session: Not on file  . Stress: Not on file  Relationships  . Social connections:    Talks on phone: Not on file    Gets together: Not on file    Attends religious service: Not on file    Active member of club or organization: Not on file    Attends meetings of clubs or organizations: Not on file    Relationship status: Not on file  Other Topics Concern  . Not on file  Social History Narrative   Patient drinks about 1 cup of coffee daily.   Patient is right handed.      Family History:  The patient's family history includes Cancer in her father; Diabetes in her brother, brother, and father; Heart attack in her sister; Heart disease in her brother and sister; Stroke in her brother.  ROS:   Please see the history of present illness.  All other systems are reviewed and otherwise negative.    PHYSICAL EXAM:   VS:  BP (!) 98/54   Pulse 72   Ht 5\' 1"  (1.549 m)   Wt 153 lb 12.8 oz  (69.8 kg)   SpO2 94%   BMI 29.06 kg/m   BMI: Body mass index is 29.06 kg/m. GEN: Well nourished, well developed AAF, in no acute distress HEENT: normocephalic, atraumatic Neck: no JVD, carotid bruits, or masses Cardiac: RRR; no murmurs, rubs, or gallops, no edema  Respiratory:  clear to auscultation bilaterally, normal work of breathing GI: soft, nontender, nondistended, + BS MS: no deformity or atrophy Skin: warm and dry, no rash Neuro:  Alert and Oriented x 3, Strength and sensation are intact, follows commands Psych: euthymic mood, full affect  Wt Readings from Last 3 Encounters:  09/04/18 153 lb 12.8 oz (69.8 kg)  08/24/18 152 lb 9.6 oz (69.2 kg)  08/14/18 151 lb 6.4 oz (68.7 kg)      Studies/Labs Reviewed:   EKG:  EKG was not ordered today. Reviewed from 08/14/18.  Recent Labs: 01/19/2018: ALT 25 08/14/2018: BUN 10; Creatinine, Ser 0.92; Hemoglobin 13.0; NT-Pro BNP 101; Platelets 223; Potassium 4.6; Sodium 138; TSH 0.959   Lipid Panel    Component Value Date/Time   CHOL 124 06/13/2018 1517   TRIG 69 06/13/2018 1517   HDL 48 06/13/2018 1517   CHOLHDL 2.6 06/13/2018 1517   CHOLHDL 3.6 01/19/2018 0000   VLDL 17 06/11/2016 0521   LDLCALC 62 06/13/2018 1517   LDLCALC 103 (H) 01/19/2018 0000   LDLDIRECT 67 06/13/2018 1517    Additional studies/ records that were reviewed today include: Summarized above   ASSESSMENT & PLAN:   1. Exertional dyspnea - the patient continues to report exertional dyspnea as well as sweating with activities of walking or making the bed despite anti-anginal titration and BP control. Noninvasive testing earlier this year was unrevealing. Cardiac CT would be unhelpful given her history of bypass grafting. She tells me today she feels as though something is not right. We walked patient in clinic today and she did get SOB with exertion. Pulse  ox remained normal. She is worried stress testing might miss something serious. At this point would  suggest right and left heart cath to definitively exclude cardiac etiology of her dyspnea. I reviewed with Dr. Irish Lack (DOD) in Dr. Theodosia Blender absence who agrees. Risks and benefits of cardiac catheterization have been discussed with the patient.  These include bleeding, infection, kidney damage, stroke, heart attack, death.  The patient understands these risks and is willing to proceed. Hold Metformin prior to study and 48 hours after. Check BMET, CBC today. I do suspect there is underlying deconditioning so if cath is unrevealing would encourage she join a monitored exercise program. Would also suggest referral to pulmonology if cath without significant findings. She wants to do cath on Friday. ER precautions reviewed. 2. CAD with h/o atypical angina - her chest pain has been quite atypical in the past, with possible microvascular component versus anxiety. Continue ASA, Plavix, BB, statin. See above. 3. Essential HTN - chlorthalidone really made the most profound difference in her BP recently. BP is actually on the soft side today. She has been taking chlorthalidone 25mg  once daily instead fo 12.5mg  as advised. She will get a pill cutter and see if she can cut the tablets in half. If not, I asked her to cut her lisinopril in half instead. Check BMET today. 4. Mild carotid disease - continue to follow clinically for now. Will defer to Dr. Radford Pax to decide timing of repeat study.  Disposition: F/u with Dr. Betsy Pries team in 2-3 weeks post-cath.  Medication Adjustments/Labs and Tests Ordered: Current medicines are reviewed at length with the patient today.  Concerns regarding medicines are outlined above. Medication changes, Labs and Tests ordered today are summarized above and listed in the Patient Instructions accessible in Encounters.   Signed, Charlie Pitter, PA-C  09/04/2018 12:31 PM    [facility] [place], [place], [place]  [address] Phone: [phone]; Fax: [phone]

## 2018-09-04 ENCOUNTER — Encounter: Payer: Self-pay | Admitting: Physician Assistant

## 2018-09-04 ENCOUNTER — Ambulatory Visit: Payer: Medicare HMO | Admitting: Physician Assistant

## 2018-09-04 VITALS — BP 98/54 | HR 72 | Ht 61.0 in | Wt 153.8 lb

## 2018-09-04 DIAGNOSIS — I2510 Atherosclerotic heart disease of native coronary artery without angina pectoris: Secondary | ICD-10-CM | POA: Diagnosis not present

## 2018-09-04 DIAGNOSIS — I779 Disorder of arteries and arterioles, unspecified: Secondary | ICD-10-CM

## 2018-09-04 DIAGNOSIS — I739 Peripheral vascular disease, unspecified: Secondary | ICD-10-CM

## 2018-09-04 DIAGNOSIS — R0609 Other forms of dyspnea: Secondary | ICD-10-CM | POA: Diagnosis not present

## 2018-09-04 DIAGNOSIS — I10 Essential (primary) hypertension: Secondary | ICD-10-CM | POA: Diagnosis not present

## 2018-09-04 DIAGNOSIS — R0600 Dyspnea, unspecified: Secondary | ICD-10-CM

## 2018-09-04 NOTE — Patient Instructions (Addendum)
Medication Instructions:  Your physician recommends that you continue on your current medications as directed. Please refer to the Current Medication list given to you today. TAKE A PILL CUTTER AND CUT THE CHLORTHALIDONE IN 1/2.Marland Kitchen IF YOU DON'T THEN YOU NEED TO MAKE SURE YOU CUT THE LISINOPRIL IN 1/2   Labwork: TODAY:  BMET & CBC   Testing/Procedures: Your physician has requested that you have a cardiac catheterization. Cardiac catheterization is used to diagnose and/or treat various heart conditions. Doctors may recommend this procedure for a number of different reasons. The most common reason is to evaluate chest pain. Chest pain can be a symptom of coronary artery disease (CAD), and cardiac catheterization can show whether plaque is narrowing or blocking your heart's arteries. This procedure is also used to evaluate the valves, as well as measure the blood flow and oxygen levels in different parts of your heart. For further information please visit [email or web address]. Please follow instruction sheet, BELOW.     [facility] [place], [place] [address] Dept: [phone] Loc: [place]  09/04/2018  You are scheduled for a Cardiac Catheterization on Friday, January 17 with Dr. Larae Grooms.  1. Please arrive at the [facility] (Main Entrance A) at [facility]: [address] [place], [place] [address] at 5:30 AM (This time is two hours before your procedure to ensure your preparation). Free valet parking service is available.   Special note: Every effort is made to have your procedure done on time. Please understand that emergencies sometimes delay scheduled procedures.  2. Diet: Do not eat solid foods after midnight.  The patient may have clear liquids until 5am upon the day of the procedure.  3. Labs: You will need to have blood drawn on TODAY  4.  Medication instructions in preparation for your procedure:   Contrast Allergy: No Stop taking, CHLORTHALIDONE Friday, January 17,  Do not take Diabetes Med Glucophage (Metformin) on the day of the procedure and HOLD 48 HOURS AFTER THE PROCEDURE.  On the morning of your procedure, take your Aspirin and Plavix and any morning medicines NOT listed above.  You may use sips of water.  5. Plan for one night stay--bring personal belongings. 6. Bring a current list of your medications and current insurance cards. 7. You MUST have a responsible person to drive you home. 8. Someone MUST be with you the first 24 hours after you arrive home or your discharge will be delayed. 9. Please wear clothes that are easy to get on and off and wear slip-on shoes.  Thank you for allowing [place] to care for you!   -- [facility]   Follow-Up: Your physician recommends that you schedule a follow-up appointment in: 09/26/18 ARRIVE AT 11:45 TO SEE MICHELE LENZE, PA-C   Any Other Special Instructions Will Be Listed Below (If Applicable).  If you need a refill on your cardiac medications before your next appointment, please call your pharmacy.   Coronary Angiogram A coronary angiogram is an X-ray procedure that is used to examine the arteries in the heart. In this procedure, a dye (contrast dye) is injected through a long, thin tube (catheter). The catheter is inserted through the groin, wrist, or arm. The dye is injected into each artery, then X-rays are taken to show if there is a blockage in the arteries of the heart. This procedure can also show if you  have valve disease or a disease of the aorta, and it can be used to check the overall function of your heart muscle. You may have a coronary angiogram if:  You are having chest pain, or other symptoms of angina, and you are at risk for heart disease.  You have an abnormal electrocardiogram (ECG) or stress test.  You have chest pain  and heart failure.  You are having irregular heart rhythms.  You and your health care provider determine that the benefits of the test information outweigh the risks of the procedure. Let your health care provider know about:  Any allergies you have, including allergies to contrast dye.  All medicines you are taking, including vitamins, herbs, eye drops, creams, and over-the-counter medicines.  Any problems you or family members have had with anesthetic medicines.  Any blood disorders you have.  Any surgeries you have had.  History of kidney problems or kidney failure.  Any medical conditions you have.  Whether you are pregnant or may be pregnant. What are the risks? Generally, this is a safe procedure. However, problems may occur, including:  Infection.  Allergic reaction to medicines or dyes that are used.  Bleeding from the access site or other locations.  Kidney injury, especially in people with impaired kidney function.  Stroke (rare).  Heart attack (rare).  Damage to other structures or organs. What happens before the procedure? Staying hydrated Follow instructions from your health care provider about hydration, which may include:  Up to 2 hours before the procedure - you may continue to drink clear liquids, such as water, clear fruit juice, black coffee, and plain tea. Eating and drinking restrictions Follow instructions from your health care provider about eating and drinking, which may include:  8 hours before the procedure - stop eating heavy meals or foods such as meat, fried foods, or fatty foods.  6 hours before the procedure - stop eating light meals or foods, such as toast or cereal.  2 hours before the procedure - stop drinking clear liquids. General instructions  Ask your health care provider about: ? Changing or stopping your regular medicines. This is especially important if you are taking diabetes medicines or blood thinners. ? Taking  medicines such as ibuprofen. These medicines can thin your blood. Do not take these medicines before your procedure if your health care provider instructs you not to, though aspirin may be recommended prior to coronary angiograms.  Plan to have someone take you home from the hospital or clinic.  You may need to have blood tests or X-rays done. What happens during the procedure?  An IV tube will be inserted into one of your veins.  You will be given one or more of the following: ? A medicine to help you relax (sedative). ? A medicine to numb the area where the catheter will be inserted into an artery (local anesthetic).  To reduce your risk of infection: ? Your health care team will wash or sanitize their hands. ? Your skin will be washed with soap. ? Hair may be removed from the area where the catheter will be inserted.  You will be connected to a continuous ECG monitor.  The catheter will be inserted into an artery. The location may be in your groin, in your wrist, or in the fold of your arm (near your elbow).  A type of X-ray (fluoroscopy) will be used to help guide the catheter to the opening of the blood vessel that is being examined.  A dye will be injected into the catheter, and X-rays will be taken. The dye will help to show where any narrowing or blockages are located in the heart arteries.  Tell your health care provider if you have any chest pain or trouble breathing during the procedure.  If blockages are found, your health care provider may perform another procedure, such as inserting a coronary stent. The procedure may vary among health care providers and hospitals. What happens after the procedure?  After the procedure, you will need to keep the area still for a few hours, or for as long as told by your health care provider. If the procedure is done through the groin, you will be instructed to not bend and not cross your legs.  The insertion site will be checked  frequently.  The pulse in your foot or wrist will be checked frequently.  You may have additional blood tests, X-rays, and a test that records the electrical activity of your heart (ECG).  Do not drive for 24 hours if you were given a sedative. Summary  A coronary angiogram is an X-ray procedure that is used to look into the arteries in the heart.  During the procedure, a dye (contrast dye) is injected through a long, thin tube (catheter). The catheter is inserted through the groin, wrist, or arm.  Tell your health care provider about any allergies you have, including allergies to contrast dye.  After the procedure, you will need to keep the area still for a few hours, or for as long as told by your health care provider. This information is not intended to replace advice given to you by your health care provider. Make sure you discuss any questions you have with your health care provider. Document Released: 02/13/2003 Document Revised: 05/21/2016 Document Reviewed: 05/21/2016 Elsevier Interactive Patient Education  2019 [employer].

## 2018-09-05 LAB — BASIC METABOLIC PANEL
BUN/Creatinine Ratio: 24 (ref 12–28)
BUN: 26 mg/dL (ref 8–27)
CALCIUM: 9.9 mg/dL (ref 8.7–10.3)
CO2: 22 mmol/L (ref 20–29)
Chloride: 106 mmol/L (ref 96–106)
Creatinine, Ser: 1.09 mg/dL — ABNORMAL HIGH (ref 0.57–1.00)
GFR calc Af Amer: 63 mL/min/{1.73_m2} (ref >59)
GFR, EST NON AFRICAN AMERICAN: 55 mL/min/{1.73_m2} — AB (ref >59)
GLUCOSE: 100 mg/dL — AB (ref 65–99)
POTASSIUM: 4.2 mmol/L (ref 3.5–5.2)
SODIUM: 143 mmol/L (ref 134–144)

## 2018-09-05 LAB — CBC
HEMOGLOBIN: 12.3 g/dL (ref 11.1–15.9)
Hematocrit: 36.5 % (ref 34.0–46.6)
MCH: 31.2 pg (ref 26.6–33.0)
MCHC: 33.7 g/dL (ref 31.5–35.7)
MCV: 93 fL (ref 79–97)
PLATELETS: 175 10*3/uL (ref 150–450)
RBC: 3.94 x10E6/uL (ref 3.77–5.28)
RDW: 12.6 % (ref 11.7–15.4)
WBC: 6.2 10*3/uL (ref 3.4–10.8)

## 2018-09-06 ENCOUNTER — Telehealth: Payer: Self-pay | Admitting: *Deleted

## 2018-09-06 MED ORDER — HYDROCHLOROTHIAZIDE 12.5 MG PO CAPS: 12.5000 mg | ORAL_CAPSULE | Freq: Every day | ORAL | 3 refills | Status: DC

## 2018-09-06 NOTE — Telephone Encounter (Signed)
Patient notified of result.  Please refer to phone note from today for complete details.   Samantha Huffman, [place] 09/06/2018 9:26 AM .   Pt does state she finds it very hard to cut the Chlorthalidone in 1/2 to = 12.5 mg daily. I advised pt per Melina Copa, PA if she was unable to cut tablet in 1/2 to let's change over to HCTZ 12.5 mg daily.Pt states she would like to change over to HCTZ 12.5mg  daily. I advised pt I will send this in today and she can start on this today since she has not yet taken the Chlorthalidone today. I also advised pt per Melina Copa, PA to make sure to hold now the HCTZ the morning of cath on 09/08/18. Pt is agreeable to plan of care and thanked me for the call.

## 2018-09-06 NOTE — Telephone Encounter (Signed)
-----   Message from Charlie Pitter, [place] sent at 09/06/2018  7:34 AM EST ----- Labs show mild bump in creatinine but we subsequently reduced chlorthalidone to 12.5mg  daily. She should hold day of cath. Please make sure she was able to cut the tabs. Otherwise, need to switch to HCTZ 12.5mg  tab. Dayna Dunn PA-C

## 2018-09-07 ENCOUNTER — Telehealth: Payer: Self-pay | Admitting: *Deleted

## 2018-09-07 NOTE — Telephone Encounter (Signed)
Pt contacted pre-catheterization scheduled at [facility] for: Friday September 08, 2018 7:30 AM Verified arrival time and place: [place] Entrance A at: 5:30 AM  No solid food after midnight prior to cath, clear liquids until 5 AM day of procedure.  Hold: Metformin-day of procedure and 48 hours post procedure. HCTZ-AM of procedure. Furosemide-AM of procedure-pt takes prn.   Except hold medications AM meds can be  taken pre-cath with sip of water including: ASA 81 mg Clopidogrel 75 mg  Confirmed patient has responsible person to drive home post procedure and for 24 hours after you arrive home: yes

## 2018-09-08 ENCOUNTER — Ambulatory Visit (HOSPITAL_COMMUNITY)
Admission: RE | Admit: 2018-09-08 | Discharge: 2018-09-08 | Disposition: A | Discharge status: Home / Self Care | Payer: Medicare HMO | Attending: Interventional Cardiology | Admitting: Interventional Cardiology

## 2018-09-08 ENCOUNTER — Other Ambulatory Visit: Payer: Self-pay

## 2018-09-08 ENCOUNTER — Encounter (HOSPITAL_COMMUNITY)
Admission: RE | Disposition: A | Discharge status: Home / Self Care | Payer: Self-pay | Source: Home / Self Care | Attending: Interventional Cardiology

## 2018-09-08 ENCOUNTER — Encounter (HOSPITAL_COMMUNITY): Payer: Self-pay | Admitting: Cardiology

## 2018-09-08 DIAGNOSIS — Z884 Allergy status to anesthetic agent status: Secondary | ICD-10-CM | POA: Diagnosis not present

## 2018-09-08 DIAGNOSIS — I272 Pulmonary hypertension, unspecified: Secondary | ICD-10-CM | POA: Diagnosis not present

## 2018-09-08 DIAGNOSIS — R0609 Other forms of dyspnea: Secondary | ICD-10-CM | POA: Insufficient documentation

## 2018-09-08 DIAGNOSIS — Z833 Family history of diabetes mellitus: Secondary | ICD-10-CM | POA: Diagnosis not present

## 2018-09-08 DIAGNOSIS — Z823 Family history of stroke: Secondary | ICD-10-CM | POA: Insufficient documentation

## 2018-09-08 DIAGNOSIS — I2510 Atherosclerotic heart disease of native coronary artery without angina pectoris: Secondary | ICD-10-CM | POA: Insufficient documentation

## 2018-09-08 DIAGNOSIS — Z90710 Acquired absence of both cervix and uterus: Secondary | ICD-10-CM | POA: Insufficient documentation

## 2018-09-08 DIAGNOSIS — E119 Type 2 diabetes mellitus without complications: Secondary | ICD-10-CM | POA: Diagnosis not present

## 2018-09-08 DIAGNOSIS — G43909 Migraine, unspecified, not intractable, without status migrainosus: Secondary | ICD-10-CM | POA: Insufficient documentation

## 2018-09-08 DIAGNOSIS — E785 Hyperlipidemia, unspecified: Secondary | ICD-10-CM | POA: Diagnosis not present

## 2018-09-08 DIAGNOSIS — Z7982 Long term (current) use of aspirin: Secondary | ICD-10-CM | POA: Insufficient documentation

## 2018-09-08 DIAGNOSIS — I6529 Occlusion and stenosis of unspecified carotid artery: Secondary | ICD-10-CM | POA: Diagnosis not present

## 2018-09-08 DIAGNOSIS — Z955 Presence of coronary angioplasty implant and graft: Secondary | ICD-10-CM | POA: Insufficient documentation

## 2018-09-08 DIAGNOSIS — R569 Unspecified convulsions: Secondary | ICD-10-CM | POA: Insufficient documentation

## 2018-09-08 DIAGNOSIS — Z880 Allergy status to penicillin: Secondary | ICD-10-CM | POA: Diagnosis not present

## 2018-09-08 DIAGNOSIS — Z8249 Family history of ischemic heart disease and other diseases of the circulatory system: Secondary | ICD-10-CM | POA: Diagnosis not present

## 2018-09-08 DIAGNOSIS — Z79899 Other long term (current) drug therapy: Secondary | ICD-10-CM | POA: Diagnosis not present

## 2018-09-08 DIAGNOSIS — I10 Essential (primary) hypertension: Secondary | ICD-10-CM | POA: Insufficient documentation

## 2018-09-08 DIAGNOSIS — Z888 Allergy status to other drugs, medicaments and biological substances status: Secondary | ICD-10-CM | POA: Insufficient documentation

## 2018-09-08 DIAGNOSIS — Z7984 Long term (current) use of oral hypoglycemic drugs: Secondary | ICD-10-CM | POA: Diagnosis not present

## 2018-09-08 DIAGNOSIS — F17210 Nicotine dependence, cigarettes, uncomplicated: Secondary | ICD-10-CM | POA: Diagnosis not present

## 2018-09-08 DIAGNOSIS — Z951 Presence of aortocoronary bypass graft: Secondary | ICD-10-CM | POA: Diagnosis not present

## 2018-09-08 DIAGNOSIS — R0600 Dyspnea, unspecified: Secondary | ICD-10-CM

## 2018-09-08 DIAGNOSIS — Z7902 Long term (current) use of antithrombotics/antiplatelets: Secondary | ICD-10-CM | POA: Insufficient documentation

## 2018-09-08 DIAGNOSIS — G4733 Obstructive sleep apnea (adult) (pediatric): Secondary | ICD-10-CM | POA: Insufficient documentation

## 2018-09-08 DIAGNOSIS — R69 Illness, unspecified: Secondary | ICD-10-CM | POA: Diagnosis not present

## 2018-09-08 HISTORY — PX: RIGHT/LEFT HEART CATH AND CORONARY/GRAFT ANGIOGRAPHY: CATH118267

## 2018-09-08 LAB — POCT I-STAT 3, VENOUS BLOOD GAS (G3P V)
Acid-base deficit: 4 mmol/L — ABNORMAL HIGH (ref 0.0–2.0)
Bicarbonate: 23 mmol/L (ref 20.0–28.0)
O2 Saturation: 71 %
TCO2: 25 mmol/L (ref 22–32)
pCO2, Ven: 49.2 mmHg (ref 44.0–60.0)
pH, Ven: 7.279 (ref 7.250–7.430)
pO2, Ven: 42 mmHg (ref 32.0–45.0)

## 2018-09-08 LAB — POCT I-STAT 3, ART BLOOD GAS (G3+)
Acid-base deficit: 5 mmol/L — ABNORMAL HIGH (ref 0.0–2.0)
Bicarbonate: 21.7 mmol/L (ref 20.0–28.0)
O2 Saturation: 99 %
PCO2 ART: 45.1 mmHg (ref 32.0–48.0)
TCO2: 23 mmol/L (ref 22–32)
pH, Arterial: 7.29 — ABNORMAL LOW (ref 7.350–7.450)
pO2, Arterial: 174 mmHg — ABNORMAL HIGH (ref 83.0–108.0)

## 2018-09-08 LAB — GLUCOSE, CAPILLARY
GLUCOSE-CAPILLARY: 142 mg/dL — AB (ref 70–99)
Glucose-Capillary: 120 mg/dL — ABNORMAL HIGH (ref 70–99)

## 2018-09-08 SURGERY — RIGHT/LEFT HEART CATH AND CORONARY/GRAFT ANGIOGRAPHY
Anesthesia: LOCAL

## 2018-09-08 MED ORDER — ONDANSETRON HCL 4 MG/2ML IJ SOLN: 4.0000 mg | Freq: Four times a day (QID) | INTRAMUSCULAR | Status: DC | PRN

## 2018-09-08 MED ORDER — LIDOCAINE HCL (PF) 1 % IJ SOLN
INTRAMUSCULAR | Status: DC | PRN
  Administered 2018-09-08: 15 mL

## 2018-09-08 MED ORDER — SODIUM CHLORIDE 0.9 % WEIGHT BASED INFUSION: 1.0000 mL/kg/h | INTRAVENOUS | Status: AC

## 2018-09-08 MED ORDER — HEPARIN (PORCINE) IN NACL 1000-0.9 UT/500ML-% IV SOLN
INTRAVENOUS | Status: DC | PRN
  Administered 2018-09-08: 500 mL

## 2018-09-08 MED ORDER — SODIUM CHLORIDE 0.9 % IV SOLN: 250.0000 mL | INTRAVENOUS | Status: DC | PRN

## 2018-09-08 MED ORDER — ACETAMINOPHEN 325 MG PO TABS: 650.0000 mg | ORAL_TABLET | ORAL | Status: DC | PRN

## 2018-09-08 MED ORDER — ASPIRIN 81 MG PO CHEW: 81.0000 mg | CHEWABLE_TABLET | ORAL | Status: DC

## 2018-09-08 MED ORDER — SODIUM CHLORIDE 0.9 % WEIGHT BASED INFUSION
3.0000 mL/kg/h | INTRAVENOUS | Status: AC
  Administered 2018-09-08: 3 mL/kg/h via INTRAVENOUS

## 2018-09-08 MED ORDER — SODIUM CHLORIDE 0.9% FLUSH: 3.0000 mL | INTRAVENOUS | Status: DC | PRN

## 2018-09-08 MED ORDER — SODIUM CHLORIDE 0.9% FLUSH: 3.0000 mL | Freq: Two times a day (BID) | INTRAVENOUS | Status: DC

## 2018-09-08 MED ORDER — IOHEXOL 350 MG/ML SOLN
INTRAVENOUS | Status: DC | PRN
  Administered 2018-09-08: 85 mL via INTRA_ARTERIAL

## 2018-09-08 MED ORDER — HEPARIN (PORCINE) IN NACL 1000-0.9 UT/500ML-% IV SOLN
INTRAVENOUS | Status: AC
  Filled 2018-09-08: qty 1000

## 2018-09-08 MED ORDER — SODIUM CHLORIDE 0.9 % WEIGHT BASED INFUSION: 1.0000 mL/kg/h | INTRAVENOUS | Status: DC

## 2018-09-08 MED ORDER — LIDOCAINE HCL (PF) 1 % IJ SOLN
INTRAMUSCULAR | Status: AC
  Filled 2018-09-08: qty 30

## 2018-09-08 MED ORDER — FENTANYL CITRATE (PF) 100 MCG/2ML IJ SOLN
INTRAMUSCULAR | Status: DC | PRN
  Administered 2018-09-08: 25 ug via INTRAVENOUS

## 2018-09-08 MED ORDER — FENTANYL CITRATE (PF) 100 MCG/2ML IJ SOLN
INTRAMUSCULAR | Status: AC
  Filled 2018-09-08: qty 2

## 2018-09-08 MED ORDER — MIDAZOLAM HCL 2 MG/2ML IJ SOLN
INTRAMUSCULAR | Status: AC
  Filled 2018-09-08: qty 2

## 2018-09-08 MED ORDER — MIDAZOLAM HCL 2 MG/2ML IJ SOLN
INTRAMUSCULAR | Status: DC | PRN
  Administered 2018-09-08: 1 mg via INTRAVENOUS

## 2018-09-08 SURGICAL SUPPLY — 11 items
CATH INFINITI 5FR MULTPACK ANG (CATHETERS) ×1 IMPLANT
CATH SWAN GANZ 7F STRAIGHT (CATHETERS) ×1 IMPLANT
KIT HEART LEFT (KITS) ×2 IMPLANT
PACK CARDIAC CATHETERIZATION (CUSTOM PROCEDURE TRAY) ×2 IMPLANT
SHEATH PINNACLE 5F 10CM (SHEATH) ×1 IMPLANT
SHEATH PINNACLE 7F 10CM (SHEATH) ×1 IMPLANT
SHEATH PROBE COVER 6X72 (BAG) ×1 IMPLANT
SYR MEDRAD MARK 7 150ML (SYRINGE) ×2 IMPLANT
TRANSDUCER W/STOPCOCK (MISCELLANEOUS) ×2 IMPLANT
TUBING CIL FLEX 10 FLL-RA (TUBING) ×2 IMPLANT
WIRE EMERALD 3MM-J .035X150CM (WIRE) ×1 IMPLANT

## 2018-09-08 NOTE — Interval H&P Note (Signed)
History and Physical Interval Note:  09/08/2018 8:27 AM  Samantha Huffman  has presented today for surgery, with the diagnosis of sob  The various methods of treatment have been discussed with the patient and family. After consideration of risks, benefits and other options for treatment, the patient has consented to  Procedure(s): RIGHT/LEFT HEART CATH AND CORONARY/GRAFT ANGIOGRAPHY (N/A) as a surgical intervention .  The patient's history has been reviewed, patient examined, no change in status, stable for surgery.  I have reviewed the patient's chart and labs.  Questions were answered to the patient's satisfaction.    Cath Lab Visit (complete for each Cath Lab visit)  Clinical Evaluation Leading to the Procedure:   ACS: No.  Non-ACS:    Anginal Classification: CCS II  Anti-ischemic medical therapy: Maximal Therapy (2 or more classes of medications)  Non-Invasive Test Results: No non-invasive testing performed  Prior CABG: Previous CABG       [facility] 09/08/2018 8:28 AM

## 2018-09-08 NOTE — Progress Notes (Signed)
Site area: Right groin a 5 french arterial and a 7 french venous sheath was rfemoved  Site Prior to Removal:  Level 0  Pressure Applied For 20 MINUTES    Bedrest Beginning at 0945am  Manual:   Yes.    Patient Status During Pull:  stable  Post Pull Groin Site:  Level 0  Post Pull Instructions Given:  Yes.    Post Pull Pulses Present:  Yes.    Dressing Applied:  Yes.    Comments:  VS remain stable

## 2018-09-08 NOTE — Discharge Instructions (Signed)
Drink plenty of fluids next couple days Hold Metformin 48 hours. May resume 1/20  Femoral Site Care This sheet gives you information about how to care for yourself after your procedure. Your health care provider may also give you more specific instructions. If you have problems or questions, contact your health care provider. What can I expect after the procedure? After the procedure, it is common to have:  Bruising that usually fades within 1-2 weeks.  Tenderness at the site. Follow these instructions at home: Wound care  Follow instructions from your health care provider about how to take care of your insertion site. Make sure you: ? Wash your hands with soap and water before you change your bandage (dressing). If soap and water are not available, use hand sanitizer. ? Change your dressing as told by your health care provider. ? Leave stitches (sutures), skin glue, or adhesive strips in place. These skin closures may need to stay in place for 2 weeks or longer. If adhesive strip edges start to loosen and curl up, you may trim the loose edges. Do not remove adhesive strips completely unless your health care provider tells you to do that.  Do not take baths, swim, or use a hot tub until your health care provider approves.  You may shower 24-48 hours after the procedure or as told by your health care provider. ? Gently wash the site with plain soap and water. ? Pat the area dry with a clean towel. ? Do not rub the site. This may cause bleeding.  Do not apply powder or lotion to the site. Keep the site clean and dry.  Check your femoral site every day for signs of infection. Check for: ? Redness, swelling, or pain. ? Fluid or blood. ? Warmth. ? Pus or a bad smell. Activity  For the first 2-3 days after your procedure, or as long as directed: ? Avoid climbing stairs as much as possible. ? Do not squat.  Do not lift anything that is heavier than 10 lb (4.5 kg), or the limit that  you are told, until your health care provider says that it is safe.  Rest as directed. ? Avoid sitting for a long time without moving. Get up to take short walks every 1-2 hours.  Do not drive for 24 hours if you were given a medicine to help you relax (sedative). General instructions  Take over-the-counter and prescription medicines only as told by your health care provider.  Keep all follow-up visits as told by your health care provider. This is important. Contact a health care provider if you have:  A fever or chills.  You have redness, swelling, or pain around your insertion site. Get help right away if:  The catheter insertion area swells very fast.  You pass out.  You suddenly start to sweat or your skin gets clammy.  The catheter insertion area is bleeding, and the bleeding does not stop when you hold steady pressure on the area.  The area near or just beyond the catheter insertion site becomes pale, cool, tingly, or numb. These symptoms may represent a serious problem that is an emergency. Do not wait to see if the symptoms will go away. Get medical help right away. Call your local emergency services (911 in the [place] Do not drive yourself to the hospital. Summary  After the procedure, it is common to have bruising that usually fades within 1-2 weeks.  Check your femoral site every day for signs of infection.  Do not lift anything that is heavier than 10 lb (4.5 kg), or the limit that you are told, until your health care provider says that it is safe. This information is not intended to replace advice given to you by your health care provider. Make sure you discuss any questions you have with your health care provider. Document Released: 04/12/2014 Document Revised: 08/22/2017 Document Reviewed: 08/22/2017 Elsevier Interactive Patient Education  2019 [employer].

## 2018-09-25 DIAGNOSIS — G4733 Obstructive sleep apnea (adult) (pediatric): Secondary | ICD-10-CM | POA: Diagnosis not present

## 2018-09-26 ENCOUNTER — Encounter: Payer: Self-pay | Admitting: Physician Assistant

## 2018-09-26 ENCOUNTER — Ambulatory Visit: Payer: Medicare HMO | Admitting: Physician Assistant

## 2018-09-26 VITALS — BP 112/60 | HR 70 | Ht 61.0 in | Wt 150.8 lb

## 2018-09-26 DIAGNOSIS — Z87898 Personal history of other specified conditions: Secondary | ICD-10-CM | POA: Diagnosis not present

## 2018-09-26 DIAGNOSIS — I2510 Atherosclerotic heart disease of native coronary artery without angina pectoris: Secondary | ICD-10-CM | POA: Diagnosis not present

## 2018-09-26 DIAGNOSIS — I10 Essential (primary) hypertension: Secondary | ICD-10-CM

## 2018-09-26 MED ORDER — CARVEDILOL 12.5 MG PO TABS: 12.5000 mg | ORAL_TABLET | Freq: Two times a day (BID) | ORAL | 3 refills | Status: DC

## 2018-09-26 NOTE — Patient Instructions (Signed)
Medication Instructions:  You should NOT be taking Hctz, Chlorthalidone or Lasix  Resume taking Carvedilol (coreg) 12.5 mg twice a day  If you need a refill on your cardiac medications before your next appointment, please call your pharmacy.   Lab work: None  If you have labs (blood work) drawn today and your tests are completely normal, you will receive your results only by: Marland Kitchen MyChart Message (if you have MyChart) OR . A paper copy in the mail If you have any lab test that is abnormal or we need to change your treatment, we will call you to review the results.  Testing/Procedures: None  Follow-Up: At [facility], you and your health needs are our priority.  As part of our continuing mission to provide you with exceptional heart care, we have created designated Provider Care Teams.  These Care Teams include your primary Cardiologist (physician) and Advanced Practice Providers (APPs -  Physician Assistants and Nurse Practitioners) who all work together to provide you with the care you need, when you need it. You will need a follow up appointment in 4 months.  Please call our office 2 months in advance to schedule this appointment.  You may see Fransico Him, MD or one of the following Advanced Practice Providers on your designated Care Team:   [place], PA-C Melina Copa, PA-C . Ermalinda Barrios, PA-C  Any Other Special Instructions Will Be Listed Below (If Applicable).

## 2018-09-26 NOTE — Progress Notes (Signed)
Cardiology Office Note    Date:  09/26/2018   ID:  Samantha Huffman, DOB 04-19-1956, MRN [id number]  PCP:  Alroy Dust, Carlean Jews.Marlou Sa, MD  Cardiologist: Fransico Him, MD EPS: None  Chief Complaint  Patient presents with  . Follow-up    History of Present Illness:  Samantha Huffman is a 63 y.o. female history of CAD status post CABG 10/2012 [facility], anormal stress test 2017 but cath showed patent grafts to the OM and LAD and 30% RCA normal LV function.  Patient has had periodic chest pain was placed on Imdur for possible microvascular angina by Dr. Radford Pax.  PCP was treating her for anxiety.  2D echo 07/2018 EF 60 to 65% with grade 1 DD.  Patient underwent cardiac catheterization 09/05/2019 found to have patent LIMA to the LAD, patent free radial graft to the OM, normal LV function, low LV filling pressures.  Chlorthalidone and Lasix were held because of low filling pressures and blood pressure otherwise continue medical therapy.  Patient comes in today for f/u. She says she can't remember what medicine she was supposed to stop in the hospital. She starts with a C but thinks she stopped the Coreg.  Chlorthalidone had been switched to HCTZ prior to cath.  She still complains of fatigue and dyspnea on exertion with little activity.    Past Medical History:  Diagnosis Date  . Carotid artery stenosis    1-39% bilateral stenosis by dopplers 12/2017  . Coronary artery disease    a. s/p CABG 10/2012.  cath 05/2016 with moderate LM stenosis, mild LCx and RCA stenosis and occluded LAD with patent LIMA to LAD, free radial to OM.  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  Marland Kitchen Dyslipidemia   . Generalized convulsive epilepsy without mention of intractable epilepsy   . Hypertension   . Memory deficit   . Migraine    "controlled on daily RX " (06/11/2016)  . Nephrolithiasis 07/30/2015   S/p lithotripsy x 3 and right and left ureter stents  . OSA (obstructive sleep apnea)    hx; was on CPAP; haven't  needed it since [facility]" (06/11/2016)  . PONV (postoperative nausea and vomiting)   . Seizures ([place])    "controlled w/daily RX; started in 2012; dr thinks they might be from the migraines; last sz was early part of 2016" (06/11/2016)  . Type II diabetes mellitus ([place])    diet controlled (06/11/2016)  . Vasovagal syncope 07/30/2015   2011    Past Surgical History:  Procedure Laterality Date  . CARDIAC CATHETERIZATION  2014; 06/11/2016  . CARDIAC CATHETERIZATION N/A 06/11/2016   Procedure: Left Heart Cath and Cors/Grafts Angiography;  Surgeon: Sherren Mocha, MD;  Location: [place] [facility];  Service: Cardiovascular;  Laterality: N/A;  . CESAREAN SECTION    . CORONARY ANGIOPLASTY    . CORONARY ARTERY BYPASS GRAFT  March, 2014   LIMA to LAD, left radial to LCx  . LITHOTRIPSY  X 3  . RIGHT/LEFT HEART CATH AND CORONARY/GRAFT ANGIOGRAPHY N/A 09/08/2018   Procedure: RIGHT/LEFT HEART CATH AND CORONARY/GRAFT ANGIOGRAPHY;  Surgeon: Martinique, Peter M, MD;  Location: [place] [facility];  Service: Cardiovascular;  Laterality: N/A;  . TOTAL ABDOMINAL HYSTERECTOMY    . TUBAL LIGATION    . URETERAL STENT PLACEMENT      Current Medications: Current Meds  Medication Sig  . acetaminophen (TYLENOL) 325 MG tablet Take 2 tablets (650 mg total) by mouth every 4 (four) hours as needed for headache or mild  pain.  Marland Kitchen ALPRAZolam (XANAX) 0.25 MG tablet Take 0.25 mg by mouth 3 (three) times daily as needed for anxiety.   Marland Kitchen aspirin EC 81 MG tablet Take 81 mg by mouth daily.  Marland Kitchen atorvastatin (LIPITOR) 80 MG tablet Take 1 tablet (80 mg total) by mouth daily.  . clopidogrel (PLAVIX) 75 MG tablet TAKE ONE TABLET BY MOUTH DAILY  . escitalopram (LEXAPRO) 10 MG tablet Take 10 mg by mouth at bedtime.   Marland Kitchen ezetimibe (ZETIA) 10 MG tablet TAKE 1 TABLET (10 MG TOTAL) BY MOUTH DAILY.  . isosorbide mononitrate (IMDUR) 120 MG 24 hr tablet Take 1 tablet (120 mg total) by mouth daily.  Marland Kitchen levETIRAcetam (KEPPRA) 500 MG tablet TAKE 1  TABLET (500 MG TOTAL) BY MOUTH 2 (TWO) TIMES DAILY.  Marland Kitchen lisinopril (PRINIVIL,ZESTRIL) 20 MG tablet Take 1 tablet (20 mg total) by mouth daily.  . metFORMIN (GLUCOPHAGE-XR) 500 MG 24 hr tablet Take 500 mg by mouth daily with breakfast.   . nitroGLYCERIN (NITROSTAT) 0.4 MG SL tablet Place 1 tablet (0.4 mg total) under the tongue every 5 (five) minutes x 3 doses as needed for chest pain.  . pantoprazole (PROTONIX) 40 MG tablet Take 1 tablet (40 mg total) by mouth daily.  Marland Kitchen topiramate (TOPAMAX) 25 MG tablet Take 3 tablets (75 mg total) by mouth 2 (two) times daily.     Allergies:   Depakote [divalproex sodium]; Anesthesia s-i-60; and Penicillins   Social History   Socioeconomic History  . Marital status: Divorced    Spouse name: Not on file  . Number of children: 2  . Years of education: 58  . Highest education level: Not on file  Occupational History  . Occupation: retired  Scientific laboratory technician  . Financial resource strain: Not on file  . Food insecurity:    Worry: Not on file    Inability: Not on file  . Transportation needs:    Medical: Not on file    Non-medical: Not on file  Tobacco Use  . Smoking status: Current Some Day Smoker    Packs/day: 0.33    Years: 35.00    Pack years: 11.55    Types: Cigarettes    Last attempt to quit: 06/11/2016    Years since quitting: 2.2  . Smokeless tobacco: Former Systems developer    Quit date: 11/20/2012  Substance and Sexual Activity  . Alcohol use: Yes    Comment: 06/11/2016 "might have a few drinks/month; usually wine"  . Drug use: No  . Sexual activity: Not Currently  Lifestyle  . Physical activity:    Days per week: Not on file    Minutes per session: Not on file  . Stress: Not on file  Relationships  . Social connections:    Talks on phone: Not on file    Gets together: Not on file    Attends religious service: Not on file    Active member of club or organization: Not on file    Attends meetings of clubs or organizations: Not on file     Relationship status: Not on file  Other Topics Concern  . Not on file  Social History Narrative   Patient drinks about 1 cup of coffee daily.   Patient is right handed.      Family History:  The patient's family history includes Cancer in her father; Diabetes in her brother, brother, and father; Heart attack in her sister; Heart disease in her brother and sister; Stroke in her brother.   ROS:  Please see the history of present illness.    Review of Systems  Constitution: Positive for malaise/fatigue.  HENT: Negative.   Eyes: Negative.   Cardiovascular: Positive for dyspnea on exertion.  Respiratory: Negative.   Hematologic/Lymphatic: Negative.   Musculoskeletal: Negative.  Negative for joint pain.  Gastrointestinal: Negative.   Genitourinary: Negative.   Neurological: Negative.   Psychiatric/Behavioral: Positive for depression. The patient is nervous/anxious.    All other systems reviewed and are negative.   PHYSICAL EXAM:   VS:  BP 112/60   Pulse 70   Ht 5\' 1"  (1.549 m)   Wt 150 lb 12.8 oz (68.4 kg)   SpO2 98%   BMI 28.49 kg/m   Physical Exam  GEN: Well nourished, well developed, in no acute distress  Neck: no JVD, right carotid bruit or masses Cardiac:RRR; positive S4, 1/6 systolic murmur at the left sternal border respiratory:  clear to auscultation bilaterally, normal work of breathing GI: soft, nontender, nondistended, + BS Ext: Right groin cath site without hematoma or hemorrhage.  Good femoral and distal pulses.  Otherwise lower extremities without cyanosis, clubbing, or edema, Good distal pulses bilaterally Neuro:  Alert and Oriented x 3 Psych: euthymic mood, full affect  Wt Readings from Last 3 Encounters:  09/26/18 150 lb 12.8 oz (68.4 kg)  09/08/18 153 lb (69.4 kg)  09/04/18 153 lb 12.8 oz (69.8 kg)      Studies/Labs Reviewed:   EKG:  EKG is not ordered today.  Recent Labs: 01/19/2018: ALT 25 08/14/2018: NT-Pro BNP 101; TSH 0.959 09/04/2018: BUN 26;  Creatinine, Ser 1.09; Hemoglobin 12.3; Platelets 175; Potassium 4.2; Sodium 143   Lipid Panel    Component Value Date/Time   CHOL 124 06/13/2018 1517   TRIG 69 06/13/2018 1517   HDL 48 06/13/2018 1517   CHOLHDL 2.6 06/13/2018 1517   CHOLHDL 3.6 01/19/2018 0000   VLDL 17 06/11/2016 0521   LDLCALC 62 06/13/2018 1517   LDLCALC 103 (H) 01/19/2018 0000   LDLDIRECT 67 06/13/2018 1517    Additional studies/ records that were reviewed today include:   Ost LM lesion is 50% stenosed.  Prox LAD-2 lesion is 100% stenosed.  Prox LAD-1 lesion is 50% stenosed.  Prox Cx lesion is 75% stenosed.  Mid RCA lesion is 30% stenosed.  LIMA.  Left radial artery graft was visualized by angiography and is normal in caliber.  The graft exhibits no disease.  The left ventricular systolic function is normal.  LV end diastolic pressure is normal.  The left ventricular ejection fraction is 55-65% by visual estimate.  There is no mitral valve regurgitation.   1, Left main and 2 vessel obstructive CAD.  2. Patent LIMA to the LAD 3. Patent free radial graft to the OM 4. Low LV filling pressures 5. Normal right heart pressures.    Plan: will hold chlorthalidone and lasix. Filling pressures and BP are low. Otherwise continue medical therapy.   Carotid Dopplers 5/6/2019Final Interpretation: Right Carotid: Velocities in the right ICA are consistent with a 1-39% stenosis.                Non-hemodynamically significant plaque <50% noted in the CCA.   Left Carotid: Velocities in the left ICA are consistent with a 1-39% stenosis.               Non-hemodynamically significant plaque noted in the CCA.   Vertebrals:  Bilateral vertebral arteries demonstrate antegrade flow. Subclavians: Normal flow hemodynamics were seen in bilateral subclavian  arteries.   *See table(s) above for measurements and observations.         ASSESSMENT:    1. Coronary artery disease involving native  coronary artery of native heart without angina pectoris   2. History of chest pain   3. Essential hypertension      PLAN:  In order of problems listed above:  Chest pain and exertional dyspnea felt to be noncardiac.  Recommend she follow-up with PCP for chronic fatigue and dyspnea on exertion  CAD status post CABG x2 in 2014 with LIMA to the LAD and free radial to the OM with recent cath 09/04/2018 patent LIMA to the LAD and patent free radial graft to the OM.  Low LV filling pressures and low blood pressure chlorthalidone and Lasix held.  Patient is unsure that she did this.  We have asked her to make sure she is still taking her carvedilol and stop chlorthalidone, HCTZ and Lasix  Essential hypertension blood pressures have been on the low side  Mild carotid disease1-39% 12/26/2017  Medication Adjustments/Labs and Tests Ordered: Current medicines are reviewed at length with the patient today.  Concerns regarding medicines are outlined above.  Medication changes, Labs and Tests ordered today are listed in the Patient Instructions below. Patient Instructions  Medication Instructions:  You should NOT be taking Hctz, Chlorthalidone or Lasix  Resume taking Carvedilol (coreg) 12.5 mg twice a day  If you need a refill on your cardiac medications before your next appointment, please call your pharmacy.   Lab work: None  If you have labs (blood work) drawn today and your tests are completely normal, you will receive your results only by: Marland Kitchen MyChart Message (if you have MyChart) OR . A paper copy in the mail If you have any lab test that is abnormal or we need to change your treatment, we will call you to review the results.  Testing/Procedures: None  Follow-Up: At [facility], you and your health needs are our priority.  As part of our continuing mission to provide you with exceptional heart care, we have created designated Provider Care Teams.  These Care Teams include your primary  Cardiologist (physician) and Advanced Practice Providers (APPs -  Physician Assistants and Nurse Practitioners) who all work together to provide you with the care you need, when you need it. You will need a follow up appointment in 4 months.  Please call our office 2 months in advance to schedule this appointment.  You may see Fransico Him, MD or one of the following Advanced Practice Providers on your designated Care Team:   [place], PA-C Melina Copa, PA-C . Ermalinda Barrios, PA-C  Any Other Special Instructions Will Be Listed Below (If Applicable).       Sumner Boast, PA-C  09/26/2018 12:40 PM    [facility] [address], [place], [place]  [address] Phone: [phone]; Fax: [phone]

## 2018-10-09 ENCOUNTER — Encounter: Payer: Self-pay | Admitting: Neurology

## 2018-10-24 DIAGNOSIS — G4733 Obstructive sleep apnea (adult) (pediatric): Secondary | ICD-10-CM | POA: Diagnosis not present

## 2018-11-13 ENCOUNTER — Ambulatory Visit: Payer: Medicare HMO | Admitting: Adult Health

## 2018-11-18 ENCOUNTER — Other Ambulatory Visit: Payer: Self-pay | Admitting: Neurology

## 2018-11-18 ENCOUNTER — Other Ambulatory Visit: Payer: Self-pay | Admitting: Cardiology

## 2018-11-24 DIAGNOSIS — G4733 Obstructive sleep apnea (adult) (pediatric): Secondary | ICD-10-CM | POA: Diagnosis not present

## 2018-12-01 DIAGNOSIS — G4733 Obstructive sleep apnea (adult) (pediatric): Secondary | ICD-10-CM | POA: Diagnosis not present

## 2018-12-24 DIAGNOSIS — G4733 Obstructive sleep apnea (adult) (pediatric): Secondary | ICD-10-CM | POA: Diagnosis not present

## 2019-01-03 DIAGNOSIS — G4733 Obstructive sleep apnea (adult) (pediatric): Secondary | ICD-10-CM | POA: Diagnosis not present

## 2019-01-09 ENCOUNTER — Other Ambulatory Visit: Payer: Self-pay | Admitting: Cardiology

## 2019-01-12 ENCOUNTER — Other Ambulatory Visit: Payer: Self-pay | Admitting: Neurology

## 2019-01-12 DIAGNOSIS — N23 Unspecified renal colic: Secondary | ICD-10-CM | POA: Diagnosis not present

## 2019-01-12 DIAGNOSIS — B354 Tinea corporis: Secondary | ICD-10-CM | POA: Diagnosis not present

## 2019-01-12 DIAGNOSIS — K529 Noninfective gastroenteritis and colitis, unspecified: Secondary | ICD-10-CM | POA: Diagnosis not present

## 2019-01-16 ENCOUNTER — Other Ambulatory Visit (HOSPITAL_COMMUNITY)
Admission: RE | Admit: 2019-01-16 | Discharge: 2019-01-16 | Disposition: A | Discharge status: Home / Self Care | Payer: Medicare HMO | Source: Ambulatory Visit | Attending: Urology | Admitting: Urology

## 2019-01-16 ENCOUNTER — Ambulatory Visit (HOSPITAL_COMMUNITY): Payer: Medicare HMO | Admitting: Certified Registered Nurse Anesthetist

## 2019-01-16 ENCOUNTER — Other Ambulatory Visit: Payer: Self-pay

## 2019-01-16 ENCOUNTER — Encounter (HOSPITAL_COMMUNITY): Payer: Self-pay | Admitting: *Deleted

## 2019-01-16 ENCOUNTER — Ambulatory Visit (HOSPITAL_COMMUNITY): Payer: Medicare HMO

## 2019-01-16 ENCOUNTER — Other Ambulatory Visit: Payer: Self-pay | Admitting: Urology

## 2019-01-16 ENCOUNTER — Encounter (HOSPITAL_COMMUNITY)
Admission: RE | Disposition: A | Discharge status: Home / Self Care | Payer: Self-pay | Source: Home / Self Care | Attending: Urology

## 2019-01-16 ENCOUNTER — Ambulatory Visit (HOSPITAL_COMMUNITY)
Admission: RE | Admit: 2019-01-16 | Discharge: 2019-01-16 | Disposition: A | Discharge status: Home / Self Care | Payer: Medicare HMO | Attending: Urology | Admitting: Urology

## 2019-01-16 ENCOUNTER — Ambulatory Visit: Payer: Medicare HMO | Admitting: Neurology

## 2019-01-16 DIAGNOSIS — R1084 Generalized abdominal pain: Secondary | ICD-10-CM | POA: Diagnosis not present

## 2019-01-16 DIAGNOSIS — N200 Calculus of kidney: Secondary | ICD-10-CM | POA: Diagnosis not present

## 2019-01-16 DIAGNOSIS — G4730 Sleep apnea, unspecified: Secondary | ICD-10-CM | POA: Insufficient documentation

## 2019-01-16 DIAGNOSIS — E119 Type 2 diabetes mellitus without complications: Secondary | ICD-10-CM | POA: Insufficient documentation

## 2019-01-16 DIAGNOSIS — Z87442 Personal history of urinary calculi: Secondary | ICD-10-CM | POA: Insufficient documentation

## 2019-01-16 DIAGNOSIS — Z79899 Other long term (current) drug therapy: Secondary | ICD-10-CM | POA: Insufficient documentation

## 2019-01-16 DIAGNOSIS — I2510 Atherosclerotic heart disease of native coronary artery without angina pectoris: Secondary | ICD-10-CM | POA: Diagnosis not present

## 2019-01-16 DIAGNOSIS — Z951 Presence of aortocoronary bypass graft: Secondary | ICD-10-CM | POA: Insufficient documentation

## 2019-01-16 DIAGNOSIS — Z1159 Encounter for screening for other viral diseases: Secondary | ICD-10-CM | POA: Insufficient documentation

## 2019-01-16 DIAGNOSIS — Z7902 Long term (current) use of antithrombotics/antiplatelets: Secondary | ICD-10-CM | POA: Insufficient documentation

## 2019-01-16 DIAGNOSIS — Z7984 Long term (current) use of oral hypoglycemic drugs: Secondary | ICD-10-CM | POA: Insufficient documentation

## 2019-01-16 DIAGNOSIS — Z7982 Long term (current) use of aspirin: Secondary | ICD-10-CM | POA: Insufficient documentation

## 2019-01-16 DIAGNOSIS — Z955 Presence of coronary angioplasty implant and graft: Secondary | ICD-10-CM | POA: Insufficient documentation

## 2019-01-16 DIAGNOSIS — F329 Major depressive disorder, single episode, unspecified: Secondary | ICD-10-CM | POA: Insufficient documentation

## 2019-01-16 DIAGNOSIS — N201 Calculus of ureter: Secondary | ICD-10-CM | POA: Insufficient documentation

## 2019-01-16 DIAGNOSIS — E7800 Pure hypercholesterolemia, unspecified: Secondary | ICD-10-CM | POA: Diagnosis not present

## 2019-01-16 DIAGNOSIS — F17200 Nicotine dependence, unspecified, uncomplicated: Secondary | ICD-10-CM | POA: Insufficient documentation

## 2019-01-16 DIAGNOSIS — N132 Hydronephrosis with renal and ureteral calculous obstruction: Secondary | ICD-10-CM | POA: Diagnosis not present

## 2019-01-16 DIAGNOSIS — K5730 Diverticulosis of large intestine without perforation or abscess without bleeding: Secondary | ICD-10-CM | POA: Diagnosis not present

## 2019-01-16 DIAGNOSIS — R569 Unspecified convulsions: Secondary | ICD-10-CM | POA: Diagnosis not present

## 2019-01-16 HISTORY — PX: CYSTOSCOPY W/ URETERAL STENT PLACEMENT: SHX1429

## 2019-01-16 HISTORY — DX: Personal history of urinary calculi: Z87.442

## 2019-01-16 LAB — BASIC METABOLIC PANEL
Anion gap: 8 (ref 5–15)
BUN: 12 mg/dL (ref 8–23)
CO2: 22 mmol/L (ref 22–32)
Calcium: 9.5 mg/dL (ref 8.9–10.3)
Chloride: 109 mmol/L (ref 98–111)
Creatinine, Ser: 0.94 mg/dL (ref 0.44–1.00)
GFR calc Af Amer: >60 mL/min (ref >60)
GFR calc non Af Amer: >60 mL/min (ref >60)
Glucose, Bld: 88 mg/dL (ref 70–99)
Potassium: 3.8 mmol/L (ref 3.5–5.1)
Sodium: 139 mmol/L (ref 135–145)

## 2019-01-16 LAB — GLUCOSE, CAPILLARY
Glucose-Capillary: 88 mg/dL (ref 70–99)
Glucose-Capillary: 121 mg/dL — ABNORMAL HIGH (ref 70–99)

## 2019-01-16 LAB — HEMOGLOBIN: Hemoglobin: 13.7 g/dL (ref 12.0–15.0)

## 2019-01-16 LAB — SARS CORONAVIRUS 2 BY RT PCR (HOSPITAL ORDER, PERFORMED IN ~~LOC~~ HOSPITAL LAB): SARS Coronavirus 2: NEGATIVE

## 2019-01-16 SURGERY — CYSTOSCOPY, WITH RETROGRADE PYELOGRAM AND URETERAL STENT INSERTION
Anesthesia: General | Laterality: Left

## 2019-01-16 MED ORDER — LIDOCAINE 2% (20 MG/ML) 5 ML SYRINGE
INTRAMUSCULAR | Status: DC | PRN
  Administered 2019-01-16: 60 mg via INTRAVENOUS

## 2019-01-16 MED ORDER — LEVETIRACETAM 500 MG PO TABS
500.0000 mg | ORAL_TABLET | ORAL | Status: AC
  Administered 2019-01-16: 500 mg via ORAL
  Filled 2019-01-16: qty 1

## 2019-01-16 MED ORDER — DEXAMETHASONE SODIUM PHOSPHATE 10 MG/ML IJ SOLN
INTRAMUSCULAR | Status: DC | PRN
  Administered 2019-01-16: 10 mg via INTRAVENOUS

## 2019-01-16 MED ORDER — BELLADONNA ALKALOIDS-OPIUM 16.2-60 MG RE SUPP
RECTAL | Status: DC | PRN
  Administered 2019-01-16: 1 via RECTAL

## 2019-01-16 MED ORDER — SODIUM CHLORIDE 0.9 % IR SOLN
Status: DC | PRN
  Administered 2019-01-16: 3000 mL

## 2019-01-16 MED ORDER — TRAMADOL HCL 50 MG PO TABS: 50.0000 mg | ORAL_TABLET | Freq: Four times a day (QID) | ORAL | 0 refills | Status: DC | PRN

## 2019-01-16 MED ORDER — ONDANSETRON HCL 4 MG/2ML IJ SOLN
INTRAMUSCULAR | Status: DC | PRN
  Administered 2019-01-16: 4 mg via INTRAVENOUS

## 2019-01-16 MED ORDER — PROPOFOL 10 MG/ML IV BOLUS
INTRAVENOUS | Status: DC | PRN
  Administered 2019-01-16: 120 mg via INTRAVENOUS

## 2019-01-16 MED ORDER — MIDAZOLAM HCL 2 MG/2ML IJ SOLN
INTRAMUSCULAR | Status: AC
  Filled 2019-01-16: qty 2

## 2019-01-16 MED ORDER — BELLADONNA ALKALOIDS-OPIUM 16.2-30 MG RE SUPP
RECTAL | Status: AC
  Filled 2019-01-16: qty 1

## 2019-01-16 MED ORDER — LACTATED RINGERS IV SOLN
INTRAVENOUS | Status: DC
  Administered 2019-01-16: 15:00:00 via INTRAVENOUS

## 2019-01-16 MED ORDER — CIPROFLOXACIN IN D5W 400 MG/200ML IV SOLN
400.0000 mg | INTRAVENOUS | Status: AC
  Administered 2019-01-16: 400 mg via INTRAVENOUS

## 2019-01-16 MED ORDER — FENTANYL CITRATE (PF) 100 MCG/2ML IJ SOLN
INTRAMUSCULAR | Status: AC
  Filled 2019-01-16: qty 2

## 2019-01-16 MED ORDER — PROPOFOL 10 MG/ML IV BOLUS
INTRAVENOUS | Status: AC
  Filled 2019-01-16: qty 60

## 2019-01-16 MED ORDER — FENTANYL CITRATE (PF) 100 MCG/2ML IJ SOLN: 25.0000 ug | INTRAMUSCULAR | Status: DC | PRN

## 2019-01-16 MED ORDER — FENTANYL CITRATE (PF) 100 MCG/2ML IJ SOLN
INTRAMUSCULAR | Status: DC | PRN
  Administered 2019-01-16: 25 ug via INTRAVENOUS
  Administered 2019-01-16: 50 ug via INTRAVENOUS
  Administered 2019-01-16: 25 ug via INTRAVENOUS

## 2019-01-16 MED ORDER — IOHEXOL 300 MG/ML  SOLN
INTRAMUSCULAR | Status: DC | PRN
  Administered 2019-01-16: 6 mL

## 2019-01-16 MED ORDER — ONDANSETRON HCL 4 MG/2ML IJ SOLN
INTRAMUSCULAR | Status: AC
  Filled 2019-01-16: qty 2

## 2019-01-16 MED ORDER — CARVEDILOL 12.5 MG PO TABS
12.5000 mg | ORAL_TABLET | Freq: Once | ORAL | Status: AC
  Administered 2019-01-16: 12.5 mg via ORAL
  Filled 2019-01-16: qty 1

## 2019-01-16 MED ORDER — PHENAZOPYRIDINE HCL 200 MG PO TABS: 200.0000 mg | ORAL_TABLET | Freq: Three times a day (TID) | ORAL | 0 refills | Status: DC | PRN

## 2019-01-16 MED ORDER — LIDOCAINE 2% (20 MG/ML) 5 ML SYRINGE
INTRAMUSCULAR | Status: AC
  Filled 2019-01-16: qty 5

## 2019-01-16 MED ORDER — SCOPOLAMINE 1 MG/3DAYS TD PT72
1.0000 | MEDICATED_PATCH | TRANSDERMAL | Status: DC
  Administered 2019-01-16: 1.5 mg via TRANSDERMAL
  Filled 2019-01-16: qty 1

## 2019-01-16 MED ORDER — MIDAZOLAM HCL 5 MG/5ML IJ SOLN
INTRAMUSCULAR | Status: DC | PRN
  Administered 2019-01-16: 2 mg via INTRAVENOUS

## 2019-01-16 MED ORDER — CIPROFLOXACIN IN D5W 400 MG/200ML IV SOLN
INTRAVENOUS | Status: AC
  Filled 2019-01-16: qty 200

## 2019-01-16 SURGICAL SUPPLY — 19 items
BAG URO CATCHER STRL LF (MISCELLANEOUS) ×2 IMPLANT
BASKET ZERO TIP NITINOL 2.4FR (BASKET) IMPLANT
CATH URET 5FR 28IN OPEN ENDED (CATHETERS) ×2 IMPLANT
CLOTH BEACON ORANGE TIMEOUT ST (SAFETY) ×2 IMPLANT
COVER WAND RF STERILE (DRAPES) IMPLANT
EXTRACTOR STONE 1.7FRX115CM (UROLOGICAL SUPPLIES) IMPLANT
GLOVE BIOGEL M STRL SZ7.5 (GLOVE) ×2 IMPLANT
GOWN STRL REUS W/TWL XL LVL3 (GOWN DISPOSABLE) ×2 IMPLANT
GUIDEWIRE ANG ZIPWIRE 038X150 (WIRE) IMPLANT
GUIDEWIRE STR DUAL SENSOR (WIRE) ×2 IMPLANT
KIT TURNOVER KIT A (KITS) IMPLANT
MANIFOLD NEPTUNE II (INSTRUMENTS) ×2 IMPLANT
PACK CYSTO (CUSTOM PROCEDURE TRAY) ×2 IMPLANT
SHEATH URETERAL 12FRX28CM (UROLOGICAL SUPPLIES) IMPLANT
SHEATH URETERAL 12FRX35CM (MISCELLANEOUS) IMPLANT
STENT URET 6FRX24 CONTOUR (STENTS) ×1 IMPLANT
TUBING CONNECTING 10 (TUBING) ×2 IMPLANT
TUBING UROLOGY SET (TUBING) ×2 IMPLANT
WIRE COONS/BENSON .038X145CM (WIRE) IMPLANT

## 2019-01-16 NOTE — Anesthesia Procedure Notes (Signed)
Procedure Name: LMA Insertion Date/Time: 01/16/2019 4:18 PM Performed by: West Pugh, CRNA Pre-anesthesia Checklist: Patient identified, Emergency Drugs available, Suction available, Patient being monitored and Timeout performed Patient Re-evaluated:Patient Re-evaluated prior to induction Oxygen Delivery Method: Circle system utilized Preoxygenation: Pre-oxygenation with 100% oxygen Induction Type: IV induction LMA: LMA with gastric port inserted LMA Size: 4.0 Number of attempts: 1 Placement Confirmation: positive ETCO2 Tube secured with: Tape Dental Injury: Teeth and Oropharynx as per pre-operative assessment

## 2019-01-16 NOTE — Anesthesia Preprocedure Evaluation (Addendum)
Anesthesia Evaluation  Patient identified by MRN, date of birth, ID band Patient awake    Reviewed: Allergy & Precautions, H&P , NPO status , Patient's Chart, lab work & pertinent test results, reviewed documented beta blocker date and time   History of Anesthesia Complications (+) PONV and history of anesthetic complications  Airway Mallampati: I  TM Distance: >3 FB Neck ROM: full    Dental no notable dental hx. (+) Edentulous Upper   Pulmonary neg pulmonary ROS, Current Smoker,    Pulmonary exam normal breath sounds clear to auscultation       Cardiovascular Exercise Tolerance: Good hypertension, Pt. on home beta blockers and Pt. on medications + CAD and + Peripheral Vascular Disease   Rhythm:regular Rate:Normal   s/p CABG 10/2012.  cath 05/2016 with moderate LM stenosis, mild LCx and RCA stenosis and occluded LAD with patent LIMA to LAD, free radial to OM.  She is felt to have microvascular disease  ECHO 1/20 Study Conclusions - Left ventricle: The cavity size was normal. There was mild concentric hypertrophy. Systolic function was normal. The estimated ejection fraction was in the range of 60% to 65%. Wall motion was normal; there were no regional wall motion abnormalities. Doppler parameters are consistent with abnormal left ventricular relaxation (grade 1 diastolic dysfunction).   Neuro/Psych Seizures -,  negative psych ROS   GI/Hepatic negative GI ROS, Neg liver ROS,   Endo/Other  diabetes, Type 2, Oral Hypoglycemic Agents  Renal/GU Renal disease  negative genitourinary   Musculoskeletal   Abdominal   Peds  Hematology negative hematology ROS (+)   Anesthesia Other Findings On plavix  Reproductive/Obstetrics negative OB ROS                          Anesthesia Physical Anesthesia Plan  ASA: III  Anesthesia Plan: General   Post-op Pain Management:    Induction: Intravenous  PONV  Risk Score and Plan: 4 or greater and Ondansetron, Dexamethasone, Treatment may vary due to age or medical condition, Midazolam and Scopolamine patch - Pre-op  Airway Management Planned: LMA  Additional Equipment:   Intra-op Plan:   Post-operative Plan: Extubation in OR  Informed Consent: I have reviewed the patients History and Physical, chart, labs and discussed the procedure including the risks, benefits and alternatives for the proposed anesthesia with the patient or authorized representative who has indicated his/her understanding and acceptance.     Dental Advisory Given  Plan Discussed with: CRNA, Anesthesiologist and Surgeon  Anesthesia Plan Comments: (  )       Anesthesia Quick Evaluation

## 2019-01-16 NOTE — H&P (Signed)
Eval of flank pain  HPI: Samantha Huffman is a 63 year-old female established patient who is here for further eval and management of flank pain.  The problem is on the left side. Her pain started about 01/08/2019. The pain is sharp. The pain is constant. The pain does radiate.   Pain medications makes the pain better. Walking, lifting, and twisting makes the pain worse. She was treated with the following pain medication(s): Oxycodone.   She has had this same pain previously. She has had kidney stones. She did see the blood in her urine. The patient has developed frequency and incontinence. She has had fever and chills.   The patient has had a low-grade fever initially, but no fever since that time. She was seen in the urgent care and was not prescribed antibiotics. Her pain has been persistent. She is having associated nausea. She denies any fevers or chills. She denies any dysuria.   The patient is on Plavix for coronary artery disease. She had stents placed in 2014 and had a 4 vessel bypass. She recently stopped the Plavix for a colonoscopy without any significant issues.     ALLERGIES: Anesthesia - Vomiting penicillin - rash on hands & feet    MEDICATIONS: Metformin Hcl 500 mg tablet  Acetaminophen 325 mg tablet  Alprazolam 0.25 mg tablet  Aspirin Ec 81 mg tablet, delayed release Oral  Carvedilol 12.5 mg tablet  Clopidogrel 75 mg tablet  Escitalopram Oxalate 10 mg tablet Oral  Ezetimibe 10 mg tablet  Furosemide 20 mg tablet  Isosorbide Mononitrate Er 120 mg tablet, extended release 24 hr  Levetiracetam 500 mg tablet Oral  Lisinopril 20 mg tablet Oral  Metoprolol Succinate 200 mg tablet, extended release 24 hr Oral  Nitroglycerin 0.4 mg tablet, sublingual  Oxycodone-Acetaminophen 5 mg-325 mg tablet  Pantoprazole Sodium 40 mg tablet, delayed release  Tylenol  Zofran     GU PSH: ESWL - 2016      PSH Notes: Renal Lithotripsy   NON-GU PSH: CABG (coronary artery bypass  grafting) Cardiac Stent Placement - 2014    GU PMH: Renal calculus, Nephrolithiasis - 2016      PMH Notes:  2015-08-13 11:55:00 - Note: Seizure   NON-GU PMH: Hypercalciuria, Hypercalciuria - 2017 Encounter for general adult medical examination without abnormal findings, Encounter for preventive health examination - 2016 Personal history of other diseases of the circulatory system, History of hypertension - 2016, History of cardiac disorder, - 2016 Personal history of other diseases of the nervous system and sense organs, History of sleep apnea - 2016 Personal history of other diseases of the respiratory system, History of asthma - 2016 Personal history of other endocrine, nutritional and metabolic disease, History of hypercholesterolemia - 2016 Personal history of other mental and behavioral disorders, History of depression - 2016 Diabetes Type 2    FAMILY HISTORY: Death of family member - Runs In Family Diabetes - Runs In Family Prostate Cancer - Runs In Family Seizure - Runs In Family   SOCIAL HISTORY: Marital Status: Single Preferred Language: English; Ethnicity: Not Hispanic Or Latino; Race: Black or African American Current Smoking Status: Patient smokes occasionally.   Tobacco Use Assessment Completed: Used Tobacco in last 30 days? Social Drinker.  Drinks 1 caffeinated drink per day.    REVIEW OF SYSTEMS:    GU Review Female:   Patient reports frequent urination. Patient denies hard to postpone urination, burning /pain with urination, get up at night to urinate, leakage of urine, stream starts and  stops, trouble starting your stream, have to strain to urinate, and being pregnant.  Gastrointestinal (Upper):   Patient reports nausea and vomiting. Patient denies indigestion/ heartburn.  Gastrointestinal (Lower):   Patient denies diarrhea and constipation.  Constitutional:   Patient denies night sweats, weight loss, fatigue, and fever.  Skin:   Patient denies skin rash/  lesion and itching.  Eyes:   Patient denies blurred vision and double vision.  Ears/ Nose/ Throat:   Patient denies sore throat and sinus problems.  Hematologic/Lymphatic:   Patient denies swollen glands and easy bruising.  Cardiovascular:   Patient denies leg swelling and chest pains.  Respiratory:   Patient denies cough and shortness of breath.  Endocrine:   Patient denies excessive thirst.  Musculoskeletal:   Patient reports back pain. Patient denies joint pain.  Neurological:   Patient denies headaches and dizziness.  Psychologic:   Patient denies depression and anxiety.   VITAL SIGNS:      01/16/2019 10:35 AM  Weight 148 lb / 67.13 kg  BP 161/76 mmHg  Pulse 73 /min  Temperature 99.3 F / 37.3 C   MULTI-SYSTEM PHYSICAL EXAMINATION:    Constitutional: Well-nourished. No physical deformities. Normally developed. Good grooming.  Neck: Neck symmetrical, not swollen. Normal tracheal position.  Respiratory: Normal breath sounds. No labored breathing, no use of accessory muscles.   Cardiovascular: Regular rate and rhythm. No murmur, no gallop. Normal temperature, normal extremity pulses, no swelling, no varicosities.   Lymphatic: No enlargement of neck, axillae, groin.  Skin: No paleness, no jaundice, no cyanosis. No lesion, no ulcer, no rash.  Neurologic / Psychiatric: Oriented to time, oriented to place, oriented to person. No depression, no anxiety, no agitation.  Gastrointestinal: No mass, no tenderness, no rigidity, non obese abdomen.  Eyes: Normal conjunctivae. Normal eyelids.  Ears, Nose, Mouth, and Throat: Left ear no scars, no lesions, no masses. Right ear no scars, no lesions, no masses. Nose no scars, no lesions, no masses. Normal hearing. Normal lips.  Musculoskeletal: Normal gait and station of head and neck.     PAST DATA REVIEWED:  Source Of History:  Patient  Records Review:   Previous Doctor Records, Previous Patient Records, POC Tool  X-Ray Review: KUB: Reviewed  Films. Discussed With Patient.     PROCEDURES:         C.T. Urogram - [id number]  The patient has a 3 mm stone in the left proximal ureter. She has nonobstructing stones in the right kidney which are very small.      Patient confirmed No Neulasta OnPro Device.           Urinalysis w/Scope Dipstick Dipstick Cont'd Micro  Color: Yellow Bilirubin: Neg mg/dL WBC/hpf: 6 - 10/hpf  Appearance: Cloudy Ketones: Neg mg/dL RBC/hpf: 3 - 10/hpf  Specific Gravity: 1.010 Blood: 3+ ery/uL Bacteria: Rare (0-9/hpf)  pH: 6.0 Protein: Neg mg/dL Cystals: NS (Not Seen)  Glucose: Neg mg/dL Urobilinogen: 0.2 mg/dL Casts: NS (Not Seen)    Nitrites: Neg Trichomonas: Present    Leukocyte Esterase: 2+ leu/uL Mucous: Not Present      Epithelial Cells: NS (Not Seen)      Yeast: NS (Not Seen)      Sperm: Not Present         Ketoralac 60mg  - 62229, N9892 Qty: 60 Adm. By: Cristobal Goldmann  Unit: mg Lot No [id number]  Route: IM Exp. Date 09/24/2019  Freq: None Mfgr.:   Site: Left Buttock   ASSESSMENT:  ICD-10 Details  1 GU:   Flank Pain - R10.84   2   Renal calculus - N20.0    PLAN:           Orders Labs Urine Culture  X-Rays: C.T. Stone Protocol Without Contrast  X-Ray Notes: History:  Hematuria: Yes/No  Patient to see MD after exam: Yes/No  Previous exam: CT / IVP/ US/ KUB/ None  When:  Where:  Diabetic: Yes/ No  BUN/ Creatinine:  Date of last BUN Creatinine:  Weight in pounds:  Allergy- IV Contrast: Yes/ No  Conflicting diabetic meds: Yes/ No  Diabetic Meds:  Prior Authorization #: [id number]           Schedule         Document Letter(s):  Created for Patient: Clinical Summary         Notes:   The patient has symptomatic renal colic which she has had for the last 4 days because of a 3 mm obstructing left proximal ureteral stone. She is eager to have some relief of her pain. Unfortunately she is on Plavix and as such cannot have shockwave therapy within the next 5 days. We  discussed treatment options beyond shockwave lithotripsy and at this point I recommended that we place a stent today and the stop the patient's Plavix namely because of the proximal stone. I suspect that treating the stone today would be difficult because of her anticoagulation and her tight ureter with the associated risks of bleeding.   Once the patient has been stented and she has had time to stop her Plavix we will take her to the OR and remove the stone exchange the stent.

## 2019-01-16 NOTE — Transfer of Care (Signed)
Immediate Anesthesia Transfer of Care Note  Patient: Samantha Huffman  Procedure(s) Performed: CYSTOSCOPY WITH RETROGRADE PYELOGRAM/URETERAL LEFT STENT PLACEMENT (Left )  Patient Location: PACU  Anesthesia Type:General  Level of Consciousness: awake, drowsy and patient cooperative  Airway & Oxygen Therapy: Patient Spontanous Breathing and Patient connected to face mask oxygen  Post-op Assessment: Report given to RN and Post -op Vital signs reviewed and stable  Post vital signs: Reviewed and stable  Last Vitals:  Vitals Value Taken Time  BP 148/71 01/16/2019  5:00 PM  Temp 36 C 01/16/2019  5:00 PM  Pulse 56 01/16/2019  5:02 PM  Resp 21 01/16/2019  5:02 PM  SpO2 99 % 01/16/2019  5:02 PM  Vitals shown include unvalidated device data.  Last Pain:  Vitals:   01/16/19 1700  TempSrc:   PainSc: Asleep         Complications: No apparent anesthesia complications

## 2019-01-16 NOTE — Op Note (Signed)
Preoperative diagnosis:  1. Obstructing left ureteral stone   Postoperative diagnosis:  1. same   Procedure:  1. Cystoscopy 2. left ureteral stent placement 3. left retrograde pyelography with interpretation   Surgeon: Ardis Hughs, MD  Anesthesia: General  Complications: None  Intraoperative findings:  left retrograde pyelography demonstrated a filling defect within the left ureter consistent with the patient's known calculus without other abnormalities.  EBL: Minimal  Specimens: None  Indication: Samantha Huffman is a 63 y.o. patient with left obstructing ureteral stone. After reviewing the management options for treatment, he elected to proceed with the above surgical procedure(s). We have discussed the potential benefits and risks of the procedure, side effects of the proposed treatment, the likelihood of the patient achieving the goals of the procedure, and any potential problems that might occur during the procedure or recuperation. Informed consent has been obtained.  Description of procedure:  The patient was taken to the operating room and general anesthesia was induced.  The patient was placed in the dorsal lithotomy position, prepped and draped in the usual sterile fashion, and preoperative antibiotics were administered. A preoperative time-out was performed.   Cystourethroscopy was performed.  The patient's urethra was examined and was normal. The bladder was then systematically examined in its entirety. There was no evidence for any bladder tumors, stones, or other mucosal pathology.    Attention then turned to the leftureteral orifice and a ureteral catheter was used to intubate the ureteral orifice.  Omnipaque contrast was injected through the ureteral catheter and a retrograde pyelogram was performed with findings as dictated above.  A 0.38 sensor guidewire was then advanced up the left ureter into the renal pelvis under fluoroscopic guidance.  The wire was then  backloaded through the cystoscope and a ureteral stent was advance over the wire using Seldinger technique.  The stent was positioned appropriately under fluoroscopic and cystoscopic guidance.  The wire was then removed with an adequate stent curl noted in the renal pelvis as well as in the bladder.  The bladder was then emptied and the procedure ended.  The patient appeared to tolerate the procedure well and without complications.  The patient was able to be awakened and transferred to the recovery unit in satisfactory condition.    Ardis Hughs, M.D.

## 2019-01-16 NOTE — Interval H&P Note (Signed)
History and Physical Interval Note:  01/16/2019 3:53 PM  Samantha Huffman  has presented today for surgery, with the diagnosis of left ureteral stone.  The various methods of treatment have been discussed with the patient and family. After consideration of risks, benefits and other options for treatment, the patient has consented to  Procedure(s): CYSTOSCOPY WITH RETROGRADE PYELOGRAM/URETERAL LEFT STENT PLACEMENT (Left) as a surgical intervention.  The patient's history has been reviewed, patient examined, no change in status, stable for surgery.  I have reviewed the patient's chart and labs.  Questions were answered to the patient's satisfaction.     Ardis Hughs

## 2019-01-16 NOTE — Discharge Instructions (Signed)
DISCHARGE INSTRUCTIONS FOR KIDNEY STONE/URETERAL STENT   MEDICATIONS:  1.  Resume all your other meds from home - except do not take any extra narcotic pain meds that you may have at home.  2. Pyridium is to help with the burning/stinging when you urinate. 3. Tramadol is for moderate/severe pain, otherwise taking upto 1000 mg every 6 hours of plainTylenol will help treat your pain.     ACTIVITY:  1. No strenuous activity x 1week  2. No driving while on narcotic pain medications  3. Drink plenty of water  4. Continue to walk at home - you can still get blood clots when you are at home, so keep active, but don't over do it.  5. May return to work/school tomorrow or when you feel ready   BATHING:  1. You can shower and we recommend daily showers   SIGNS/SYMPTOMS TO CALL:  Please call us if you have a fever greater than 101.5, uncontrolled nausea/vomiting, uncontrolled pain, dizziness, unable to urinate, bloody urine, chest pain, shortness of breath, leg swelling, leg pain, redness around wound, drainage from wound, or any other concerns or questions.   You can reach [place] at [phone].   FOLLOW-UP:  1. You will be scheduled for removal of your stone in the next 7-10 days.

## 2019-01-17 ENCOUNTER — Encounter (HOSPITAL_COMMUNITY): Payer: Self-pay | Admitting: Urology

## 2019-01-17 ENCOUNTER — Other Ambulatory Visit: Payer: Self-pay | Admitting: Urology

## 2019-01-17 NOTE — Anesthesia Postprocedure Evaluation (Signed)
Anesthesia Post Note  Patient: Samantha Huffman  Procedure(s) Performed: CYSTOSCOPY WITH RETROGRADE PYELOGRAM/URETERAL LEFT STENT PLACEMENT (Left )     Patient location during evaluation: PACU Anesthesia Type: General Level of consciousness: awake and alert Pain management: pain level controlled Vital Signs Assessment: post-procedure vital signs reviewed and stable Respiratory status: spontaneous breathing, nonlabored ventilation, respiratory function stable and patient connected to nasal cannula oxygen Cardiovascular status: blood pressure returned to baseline and stable Postop Assessment: no apparent nausea or vomiting Anesthetic complications: no    Last Vitals:  Vitals:   01/16/19 1730 01/16/19 1745  BP: (!) 156/73 (!) 170/74  Pulse: (!) 59 61  Resp: 19 15  Temp:    SpO2: 94% 96%    Last Pain:  Vitals:   01/16/19 1745  TempSrc:   PainSc: 2                  [name] L [name]

## 2019-01-20 NOTE — Progress Notes (Signed)
LOV MICHELE LENTZ PA CARDIOLOGY 09-26-2018 Epic EKG 08-14-18 Epic HEART CATH 09-18-2018 Epic  ECHO 08-07-18 EPIC

## 2019-01-20 NOTE — Patient Instructions (Addendum)
Samantha Huffman     Your procedure is scheduled on: 01-24-2019  Report to [facility] Main  Entrance  Report to admitting at 1230 PM   [place] [facility] TEST ON 01-22-2019 @ 1000 am THIS TEST MUST BE DONE BEFORE SURGERY. BRING CPAP MASK AND TUBING  Call this number if you have problems the morning of surgery [phone]    Remember: Do not eat food  :After Midnight CLEAR LIQUIDS FROM MIDNIGHT UNTIL 830 AM , NOTHING BY MOUTH AFTER 845 AM.. BRUSH YOUR TEETH MORNING OF SURGERY AND RINSE YOUR MOUTH OUT, NO CHEWING GUM CANDY OR MINTS.     CLEAR LIQUID DIET   Foods Allowed                                                                     Foods Excluded  Coffee and tea, regular and decaf                             liquids that you cannot  Plain Jell-O in any flavor                                             see through such as: Fruit ices (not with fruit pulp)                                     milk, soups, orange juice  Iced Popsicles                                    All solid food Carbonated beverages, regular and diet                                    Cranberry, grape and apple juices Sports drinks like Gatorade Lightly seasoned clear broth or consume(fat free) Sugar, honey syrup  Sample Menu Breakfast                                Lunch                                     Supper Cranberry juice                    Beef broth                            Chicken broth Jell-O  Grape juice                           Apple juice Coffee or tea                        Jell-O                                      Popsicle                                                Coffee or tea                        Coffee or tea  _____________________________________________________________________     Take these medicines the morning of surgery with A SIP OF WATER: ALPRAZOLAM IF NEEDED, ATORVASTATIN (LIPITOR), CARVEDILOL (COREG),  ISOSORBIDE MONO NITRATE (IMDUR), KEPPRA, PANTAPRAZOLE (PROTONIX), TRAMADOL IF NEEED  DO NOT TAKE ANY DIABETIC MEDICATIONS DAY OF YOUR SURGERY                How to Manage Your Diabetes Before and After Surgery  Why is it important to control my blood sugar before and after surgery? . Improving blood sugar levels before and after surgery helps healing and can limit problems. . A way of improving blood sugar control is eating a healthy diet by: o  Eating less sugar and carbohydrates o  Increasing activity/exercise o  Talking with your doctor about reaching your blood sugar goals . High blood sugars (greater than 180 mg/dL) can raise your risk of infections and slow your recovery, so you will need to focus on controlling your diabetes during the weeks before surgery. . Make sure that the doctor who takes care of your diabetes knows about your planned surgery including the date and location.  How do I manage my blood sugar before surgery? . Check your blood sugar at least 4 times a day, starting 2 days before surgery, to make sure that the level is not too high or low. o Check your blood sugar the morning of your surgery when you wake up and every 2 hours until you get to the Short Stay unit. . If your blood sugar is less than 70 mg/dL, you will need to treat for low blood sugar: o Do not take insulin. o Treat a low blood sugar (less than 70 mg/dL) with  cup of clear juice (cranberry or apple), 4 glucose tablets, OR glucose gel. o Recheck blood sugar in 15 minutes after treatment (to make sure it is greater than 70 mg/dL). If your blood sugar is not greater than 70 mg/dL on recheck, call [phone] for further instructions. . Report your blood sugar to the short stay nurse when you get to Short Stay.  . If you are admitted to the hospital after surgery: o Your blood sugar will be checked by the staff and you will probably be given insulin after surgery (instead of oral diabetes medicines) to  make sure you have good blood sugar levels. o The goal for blood sugar control after surgery is 80-180 mg/dL.   WHAT DO I DO ABOUT MY DIABETES MEDICATION?  Marland Kitchen Do not take oral  diabetes medicines (pills) the morning of surgery.  . THE DAY  BEFORE SURGERY TAKE METFORMIN AS USUAL                    You may not have any metal on your body including hair pins and              piercings  Do not wear jewelry, make-up, lotions, powders or perfumes, deodorant             Do not wear nail polish.  Do not shave  48 hours prior to surgery.               Do not bring valuables to the hospital. [place].  Contacts, dentures or bridgework may not be worn into surgery.  Leave suitcase in the car. After surgery it may be brought to your room.     Patients discharged the day of surgery will not be allowed to drive home. IF YOU ARE HAVING SURGERY AND GOING HOME THE SAME DAY, YOU MUST HAVE AN ADULT TO DRIVE YOU HOME AND BE WITH YOU FOR 24 HOURS. YOU MAY GO HOME BY TAXI OR UBER OR ORTHERWISE, BUT AN ADULT MUST ACCOMPANY YOU HOME AND STAY WITH YOU FOR 24 HOURS.  Name and phone number of your driver: Samantha Huffman PT TO [facility]  Special Instructions: N/A              Please read over the following fact sheets you were given: _____________________________________________________________________             [facility] - Preparing for Surgery Before surgery, you can play an important role.  Because skin is not sterile, your skin needs to be as free of germs as possible.  You can reduce the number of germs on your skin by washing with CHG (chlorahexidine gluconate) soap before surgery.  CHG is an antiseptic cleaner which kills germs and bonds with the skin to continue killing germs even after washing. Please DO NOT use if you have an allergy to CHG or antibacterial soaps.  If your skin becomes reddened/irritated stop using the CHG and  inform your nurse when you arrive at Short Stay. Do not shave (including legs and underarms) for at least 48 hours prior to the first CHG shower.  You may shave your face/neck. Please follow these instructions carefully:  1.  Shower with CHG Soap the night before surgery and the  morning of Surgery.  2.  If you choose to wash your hair, wash your hair first as usual with your  normal  shampoo.  3.  After you shampoo, rinse your hair and body thoroughly to remove the  shampoo.                           4.  Use CHG as you would any other liquid soap.  You can apply chg directly  to the skin and wash                       Gently with a scrungie or clean washcloth.  5.  Apply the CHG Soap to your body ONLY FROM THE NECK DOWN.   Do not use on face/ open  Wound or open sores. Avoid contact with eyes, ears mouth and genitals (private parts).                       Wash face,  Genitals (private parts) with your normal soap.             6.  Wash thoroughly, paying special attention to the area where your surgery  will be performed.  7.  Thoroughly rinse your body with warm water from the neck down.  8.  DO NOT shower/wash with your normal soap after using and rinsing off  the CHG Soap.                9.  Pat yourself dry with a clean towel.            10.  Wear clean pajamas.            11.  Place clean sheets on your bed the night of your first shower and do not  sleep with pets. Day of Surgery : Do not apply any lotions/deodorants the morning of surgery.  Please wear clean clothes to the hospital/surgery center.  FAILURE TO FOLLOW THESE INSTRUCTIONS MAY RESULT IN THE CANCELLATION OF YOUR SURGERY PATIENT SIGNATURE_________________________________  NURSE SIGNATURE__________________________________  ________________________________________________________________________

## 2019-01-22 ENCOUNTER — Encounter (HOSPITAL_COMMUNITY): Payer: Self-pay

## 2019-01-22 ENCOUNTER — Other Ambulatory Visit (HOSPITAL_COMMUNITY)
Admission: RE | Admit: 2019-01-22 | Discharge: 2019-01-22 | Disposition: A | Discharge status: Home / Self Care | Payer: Medicare HMO | Source: Ambulatory Visit | Attending: Urology | Admitting: Urology

## 2019-01-22 ENCOUNTER — Other Ambulatory Visit: Payer: Self-pay

## 2019-01-22 ENCOUNTER — Encounter (HOSPITAL_COMMUNITY)
Admission: RE | Admit: 2019-01-22 | Discharge: 2019-01-22 | Disposition: A | Discharge status: Home / Self Care | Payer: Medicare HMO | Source: Ambulatory Visit | Attending: Urology | Admitting: Urology

## 2019-01-22 DIAGNOSIS — Z880 Allergy status to penicillin: Secondary | ICD-10-CM | POA: Diagnosis not present

## 2019-01-22 DIAGNOSIS — F17200 Nicotine dependence, unspecified, uncomplicated: Secondary | ICD-10-CM | POA: Diagnosis not present

## 2019-01-22 DIAGNOSIS — G4730 Sleep apnea, unspecified: Secondary | ICD-10-CM | POA: Diagnosis not present

## 2019-01-22 DIAGNOSIS — Z951 Presence of aortocoronary bypass graft: Secondary | ICD-10-CM | POA: Insufficient documentation

## 2019-01-22 DIAGNOSIS — F329 Major depressive disorder, single episode, unspecified: Secondary | ICD-10-CM | POA: Diagnosis not present

## 2019-01-22 DIAGNOSIS — Z7902 Long term (current) use of antithrombotics/antiplatelets: Secondary | ICD-10-CM | POA: Diagnosis not present

## 2019-01-22 DIAGNOSIS — Z820 Family history of epilepsy and other diseases of the nervous system: Secondary | ICD-10-CM | POA: Diagnosis not present

## 2019-01-22 DIAGNOSIS — Z1159 Encounter for screening for other viral diseases: Secondary | ICD-10-CM | POA: Insufficient documentation

## 2019-01-22 DIAGNOSIS — Z8042 Family history of malignant neoplasm of prostate: Secondary | ICD-10-CM | POA: Diagnosis not present

## 2019-01-22 DIAGNOSIS — N201 Calculus of ureter: Secondary | ICD-10-CM | POA: Insufficient documentation

## 2019-01-22 DIAGNOSIS — Z87442 Personal history of urinary calculi: Secondary | ICD-10-CM | POA: Insufficient documentation

## 2019-01-22 DIAGNOSIS — E785 Hyperlipidemia, unspecified: Secondary | ICD-10-CM | POA: Insufficient documentation

## 2019-01-22 DIAGNOSIS — E7800 Pure hypercholesterolemia, unspecified: Secondary | ICD-10-CM | POA: Diagnosis not present

## 2019-01-22 DIAGNOSIS — J45909 Unspecified asthma, uncomplicated: Secondary | ICD-10-CM | POA: Diagnosis not present

## 2019-01-22 DIAGNOSIS — Z7982 Long term (current) use of aspirin: Secondary | ICD-10-CM | POA: Insufficient documentation

## 2019-01-22 DIAGNOSIS — Z955 Presence of coronary angioplasty implant and graft: Secondary | ICD-10-CM | POA: Diagnosis not present

## 2019-01-22 DIAGNOSIS — Z79899 Other long term (current) drug therapy: Secondary | ICD-10-CM | POA: Insufficient documentation

## 2019-01-22 DIAGNOSIS — F17210 Nicotine dependence, cigarettes, uncomplicated: Secondary | ICD-10-CM | POA: Insufficient documentation

## 2019-01-22 DIAGNOSIS — E119 Type 2 diabetes mellitus without complications: Secondary | ICD-10-CM | POA: Insufficient documentation

## 2019-01-22 DIAGNOSIS — K219 Gastro-esophageal reflux disease without esophagitis: Secondary | ICD-10-CM | POA: Insufficient documentation

## 2019-01-22 DIAGNOSIS — R569 Unspecified convulsions: Secondary | ICD-10-CM | POA: Diagnosis not present

## 2019-01-22 DIAGNOSIS — N132 Hydronephrosis with renal and ureteral calculous obstruction: Secondary | ICD-10-CM | POA: Diagnosis not present

## 2019-01-22 DIAGNOSIS — R69 Illness, unspecified: Secondary | ICD-10-CM | POA: Diagnosis not present

## 2019-01-22 DIAGNOSIS — I2510 Atherosclerotic heart disease of native coronary artery without angina pectoris: Secondary | ICD-10-CM | POA: Insufficient documentation

## 2019-01-22 DIAGNOSIS — Z833 Family history of diabetes mellitus: Secondary | ICD-10-CM | POA: Diagnosis not present

## 2019-01-22 DIAGNOSIS — Z01812 Encounter for preprocedural laboratory examination: Secondary | ICD-10-CM | POA: Insufficient documentation

## 2019-01-22 DIAGNOSIS — I10 Essential (primary) hypertension: Secondary | ICD-10-CM | POA: Insufficient documentation

## 2019-01-22 DIAGNOSIS — Z884 Allergy status to anesthetic agent status: Secondary | ICD-10-CM | POA: Diagnosis not present

## 2019-01-22 DIAGNOSIS — N202 Calculus of kidney with calculus of ureter: Secondary | ICD-10-CM | POA: Diagnosis present

## 2019-01-22 DIAGNOSIS — Z7984 Long term (current) use of oral hypoglycemic drugs: Secondary | ICD-10-CM | POA: Insufficient documentation

## 2019-01-22 HISTORY — DX: Gastro-esophageal reflux disease without esophagitis: K21.9

## 2019-01-22 LAB — GLUCOSE, CAPILLARY: Glucose-Capillary: 128 mg/dL — ABNORMAL HIGH (ref 70–99)

## 2019-01-22 LAB — CBC
HCT: 39.8 % (ref 36.0–46.0)
Hemoglobin: 12.1 g/dL (ref 12.0–15.0)
MCH: 30 pg (ref 26.0–34.0)
MCHC: 30.4 g/dL (ref 30.0–36.0)
MCV: 98.5 fL (ref 80.0–100.0)
Platelets: 183 10*3/uL (ref 150–400)
RBC: 4.04 MIL/uL (ref 3.87–5.11)
RDW: 12.6 % (ref 11.5–15.5)
WBC: 7 10*3/uL (ref 4.0–10.5)
nRBC: 0 % (ref 0.0–0.2)

## 2019-01-22 LAB — BASIC METABOLIC PANEL
Anion gap: 6 (ref 5–15)
BUN: 12 mg/dL (ref 8–23)
CO2: 21 mmol/L — ABNORMAL LOW (ref 22–32)
Calcium: 9.2 mg/dL (ref 8.9–10.3)
Chloride: 112 mmol/L — ABNORMAL HIGH (ref 98–111)
Creatinine, Ser: 0.85 mg/dL (ref 0.44–1.00)
GFR calc Af Amer: >60 mL/min (ref >60)
GFR calc non Af Amer: >60 mL/min (ref >60)
Glucose, Bld: 124 mg/dL — ABNORMAL HIGH (ref 70–99)
Potassium: 4.4 mmol/L (ref 3.5–5.1)
Sodium: 139 mmol/L (ref 135–145)

## 2019-01-22 LAB — HEMOGLOBIN A1C
Hgb A1c MFr Bld: 7.4 % — ABNORMAL HIGH (ref 4.8–5.6)
Mean Plasma Glucose: 165.68 mg/dL

## 2019-01-22 NOTE — Progress Notes (Signed)
PCP: dean mitchell  CARDIOLOGIST:dr turner  INFO IN Epic:ekg 08-14-18, heart cath 09-08-18, echo 08-07-18 lov michele lentz pa 09-26-18  INFO ON CHART:  BLOOD THINNERS AND LAST DOSES:plavix 01-16-19, instructed to stop by dr Louis Meckel, normally managed by dr turner ____________________________________  PATIENT SYMPTOMS AT TIME OF PREOP:

## 2019-01-23 NOTE — Progress Notes (Signed)
SPOKE W/  _ LEFT VM TO CALL BACK WITH ANY QUESTIONS OR SYMPTOMS   SCREENING SYMPTOMS OF COVID 19:   COUGH--  RUNNY NOSE---   SORE THROAT---  NASAL CONGESTION----  SNEEZING----  SHORTNESS OF BREATH---  DIFFICULTY BREATHING---  TEMP >100.0 -----  UNEXPLAINED BODY ACHES------  CHILLS --------   HEADACHES ---------  LOSS OF SMELL/ TASTE --------    HAVE YOU OR ANY FAMILY MEMBER TRAVELLED PAST 14 DAYS OUT OF THE   COUNTY--- STATE---- COUNTRY----  HAVE YOU OR ANY FAMILY MEMBER BEEN EXPOSED TO ANYONE WITH COVID 19?

## 2019-01-23 NOTE — Progress Notes (Signed)
Anesthesia Chart Review   Case:  [id number] Date/Time:  01/24/19 1415   Procedure:  LEFT URETEROSCOPY HOLMIUM LASER STONE REMOVAL AND STENT EXCHANGE (Left )   Anesthesia type:  General   Pre-op diagnosis:  LEFT URETERAL STONE   Location:  [facility] ROOM 08 / [facility]   Surgeon:  Ardis Hughs, MD      DISCUSSION: 63 yo current some day smoker (11.55 pack years) with h/o PONV, HTN, dyslipidemia, CAD (s/p CABG 2014), DM II, seizures (controlled with meds, last seizure 2017), GERD, migraines, left ureteral stone scheduled for above procedure 01/24/2019 with Dr. Louis Meckel.   Pt last seen by cardiology 09/26/2018.  Seen by Ermalinda Barrios, PA-C.  CAD status post CABG x2 in 2014 with LIMA to the LAD and free radial to the OM with recent cath 09/04/2018 patent LIMA to the LAD and patent free radial graft to the OM.  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  S/p cystoscopy with left stent placement 01/16/2019 with no anesthesia complications noted.  VS: BP (!) 145/64   Pulse (!) 57   Temp 36.4 C (Oral)   Ht 5\' 1"  (1.549 m)   Wt 67.1 kg   BMI 27.96 kg/m   PROVIDERS: Mitchell, L.Marlou Sa, MD is PCP   Fransico Him, MD is Cardiologist  LABS: Labs reviewed: Acceptable for surgery. (all labs ordered are listed, but only abnormal results are displayed)  Labs Reviewed  GLUCOSE, CAPILLARY - Abnormal; Notable for the following components:      Result Value   Glucose-Capillary 128 (*)    All other components within normal limits  HEMOGLOBIN A1C - Abnormal; Notable for the following components:   Hgb A1c MFr Bld 7.4 (*)    All other components within normal limits  BASIC METABOLIC PANEL - Abnormal; Notable for the following components:   Chloride 112 (*)    CO2 21 (*)    Glucose, Bld 124 (*)    All other components within normal limits  CBC     IMAGES:   EKG: 08/14/18 Rate 73 bpm Normal sinus rhythm  Incomplete right bundle branch block  T wave abnormality, consider anterolateral  ischemia, similar to prior  CV: Cardiac Cath 09/08/2018  Ost LM lesion is 50% stenosed.  Prox LAD-2 lesion is 100% stenosed.  Prox LAD-1 lesion is 50% stenosed.  Prox Cx lesion is 75% stenosed.  Mid RCA lesion is 30% stenosed.  LIMA.  Left radial artery graft was visualized by angiography and is normal in caliber.  The graft exhibits no disease.  The left ventricular systolic function is normal.  LV end diastolic pressure is normal.  The left ventricular ejection fraction is 55-65% by visual estimate.  There is no mitral valve regurgitation.   1, Left main and 2 vessel obstructive CAD.  2. Patent LIMA to the LAD 3. Patent free radial graft to the OM 4. Low LV filling pressures 5. Normal right heart pressures.   Plan: will hold chlorthalidone and lasix. Filling pressures and BP are low. Otherwise continue medical therapy.  Echo 08/07/18 Study Conclusions  - Left ventricle: The cavity size was normal. There was mild   concentric hypertrophy. Systolic function was normal. The   estimated ejection fraction was in the range of 60% to 65%. Wall   motion was normal; there were no regional wall motion   abnormalities. Doppler parameters are consistent with abnormal   left ventricular relaxation (grade 1 diastolic dysfunction).   There was no evidence of  elevated ventricular filling pressure by   Doppler parameters. - Aortic valve: Trileaflet; mildly thickened, mildly calcified   leaflets. There was no regurgitation. - Mitral valve: Calcified annulus. Mildly thickened leaflets . - Right ventricle: The cavity size was normal. Wall thickness was   normal. Systolic function was normal. - Right atrium: The atrium was normal in size. - Tricuspid valve: There was no regurgitation. - Pulmonary arteries: Systolic pressure was within the normal   range. - Inferior vena cava: The vessel was normal in size. - Pericardium, extracardiac: There was no pericardial  effusion.  Myocardial Perfusion 11/29/17  The left ventricular ejection fraction is normal (55-65%).  Nuclear stress EF: 57%.  There was less than 78mm of horizontal ST segment depression during infusion.  Myocardial perfusion images are normal.  This is a low risk study.   Past Medical History:  Diagnosis Date  . Carotid artery stenosis    1-39% bilateral stenosis by dopplers 12/2017  . Coronary artery disease    a. s/p CABG 10/2012.  cath 05/2016 with moderate LM stenosis, mild LCx and RCA stenosis and occluded LAD with patent LIMA to LAD, free radial to OM.  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  Marland Kitchen Dyslipidemia   . Family history of adverse reaction to anesthesia    mother and sister ponv  . Generalized convulsive epilepsy without mention of intractable epilepsy   . GERD (gastroesophageal reflux disease)   . History of kidney stones   . Hypertension   . Memory deficit    slight  . Migraine    "controlled on daily RX " (06/11/2016)  . Nephrolithiasis 07/30/2015   S/p lithotripsy x 3 and right and left ureter stents  . OSA (obstructive sleep apnea)    cpap   . PONV (postoperative nausea and vomiting)   . Seizures ([place])    "controlled w/daily RX; started in 2012; dr thinks they might be from the migraines; last sz was early part of 2016" (06/11/2016)  . Type II diabetes mellitus (HCC)    metformin  . Vasovagal syncope 07/30/2015   2011    Past Surgical History:  Procedure Laterality Date  . CARDIAC CATHETERIZATION  2014; 06/11/2016  . CARDIAC CATHETERIZATION N/A 06/11/2016   Procedure: Left Heart Cath and Cors/Grafts Angiography;  Surgeon: Sherren Mocha, MD;  Location: [facility];  Service: Cardiovascular;  Laterality: N/A;  . CESAREAN SECTION    . CORONARY ANGIOPLASTY    . CORONARY ARTERY BYPASS GRAFT  March, 2014   LIMA to LAD, left radial to LCx x 2  . CYSTOSCOPY W/ URETERAL STENT PLACEMENT Left 01/16/2019   Procedure: CYSTOSCOPY WITH RETROGRADE  PYELOGRAM/URETERAL LEFT STENT PLACEMENT;  Surgeon: Ardis Hughs, MD;  Location[facility];  Service: Urology;  Laterality: Left;  . LITHOTRIPSY  X 3  . RIGHT/LEFT HEART CATH AND CORONARY/GRAFT ANGIOGRAPHY N/A 09/08/2018   Procedure: RIGHT/LEFT HEART CATH AND CORONARY/GRAFT ANGIOGRAPHY;  Surgeon: Martinique, Peter M, MD;  Location: [facility];  Service: Cardiovascular;  Laterality: N/A;  . TOTAL ABDOMINAL HYSTERECTOMY     ovaries took before hysterectomy  . TUBAL LIGATION    . URETERAL STENT PLACEMENT      MEDICATIONS: . acetaminophen (TYLENOL) 325 MG tablet  . ALPRAZolam (XANAX) 0.25 MG tablet  . aspirin EC 81 MG tablet  . atorvastatin (LIPITOR) 80 MG tablet  . carvedilol (COREG) 12.5 MG tablet  . clopidogrel (PLAVIX) 75 MG tablet  . clotrimazole-betamethasone (LOTRISONE) cream  . escitalopram (LEXAPRO)  10 MG tablet  . ezetimibe (ZETIA) 10 MG tablet  . furosemide (LASIX) 20 MG tablet  . isosorbide mononitrate (IMDUR) 120 MG 24 hr tablet  . levETIRAcetam (KEPPRA) 500 MG tablet  . lisinopril (PRINIVIL,ZESTRIL) 20 MG tablet  . metFORMIN (GLUCOPHAGE-XR) 500 MG 24 hr tablet  . nitroGLYCERIN (NITROSTAT) 0.4 MG SL tablet  . pantoprazole (PROTONIX) 40 MG tablet  . phenazopyridine (PYRIDIUM) 200 MG tablet  . topiramate (TOPAMAX) 25 MG tablet  . traMADol (ULTRAM) 50 MG tablet   No current facility-administered medications for this encounter.     Maia Plan  [facility] Pre-Surgical Testing [phone] 01/23/19 9:31 AM

## 2019-01-23 NOTE — Anesthesia Preprocedure Evaluation (Addendum)
Anesthesia Evaluation  Patient identified by MRN, date of birth, ID band Patient awake    Reviewed: Allergy & Precautions, NPO status , Patient's Chart, lab work & pertinent test results, reviewed documented beta blocker date and time   History of Anesthesia Complications (+) PONV and history of anesthetic complications  Airway Mallampati: II  TM Distance: >3 FB Neck ROM: Full    Dental  (+) Edentulous Upper, Missing,    Pulmonary sleep apnea and Continuous Positive Airway Pressure Ventilation , Current Smoker,    Pulmonary exam normal        Cardiovascular hypertension, Pt. on medications and Pt. on home beta blockers + CAD and + CABG (2014)  Normal cardiovascular exam  TTE 12/19: mild LVH, EF 24-46%, grade 1 diastolic dysfunction    Neuro/Psych Seizures - (last 2016), Well Controlled,     GI/Hepatic Neg liver ROS, GERD  ,  Endo/Other  diabetes, Type 2, Oral Hypoglycemic Agents  Renal/GU Renal stones     Musculoskeletal negative musculoskeletal ROS (+)   Abdominal   Peds  Hematology negative hematology ROS (+)   Anesthesia Other Findings Day of surgery medications reviewed with the patient.  Reproductive/Obstetrics                           Anesthesia Physical Anesthesia Plan  ASA: III  Anesthesia Plan: General   Post-op Pain Management:    Induction: Intravenous  PONV Risk Score and Plan: Treatment may vary due to age or medical condition, Ondansetron and Dexamethasone  Airway Management Planned: Oral ETT  Additional Equipment:   Intra-op Plan:   Post-operative Plan: Extubation in OR  Informed Consent: I have reviewed the patients History and Physical, chart, labs and discussed the procedure including the risks, benefits and alternatives for the proposed anesthesia with the patient or authorized representative who has indicated his/her understanding and acceptance.      Dental advisory given  Plan Discussed with: CRNA  Anesthesia Plan Comments: (See PAT note 01/22/2019, Konrad Felix, PA-C)      Anesthesia Quick Evaluation

## 2019-01-24 ENCOUNTER — Ambulatory Visit (HOSPITAL_COMMUNITY)
Admission: RE | Admit: 2019-01-24 | Discharge: 2019-01-24 | Disposition: A | Discharge status: Home / Self Care | Payer: Medicare HMO | Attending: Urology | Admitting: Urology

## 2019-01-24 ENCOUNTER — Encounter (HOSPITAL_COMMUNITY): Payer: Self-pay | Admitting: Emergency Medicine

## 2019-01-24 ENCOUNTER — Ambulatory Visit (HOSPITAL_COMMUNITY): Payer: Medicare HMO | Admitting: Physician Assistant

## 2019-01-24 ENCOUNTER — Ambulatory Visit (HOSPITAL_COMMUNITY): Payer: Medicare HMO | Admitting: Anesthesiology

## 2019-01-24 ENCOUNTER — Ambulatory Visit (HOSPITAL_COMMUNITY): Payer: Medicare HMO

## 2019-01-24 ENCOUNTER — Other Ambulatory Visit: Payer: Self-pay

## 2019-01-24 ENCOUNTER — Encounter (HOSPITAL_COMMUNITY)
Admission: RE | Disposition: A | Discharge status: Home / Self Care | Payer: Self-pay | Source: Home / Self Care | Attending: Urology

## 2019-01-24 DIAGNOSIS — Z820 Family history of epilepsy and other diseases of the nervous system: Secondary | ICD-10-CM | POA: Insufficient documentation

## 2019-01-24 DIAGNOSIS — G4733 Obstructive sleep apnea (adult) (pediatric): Secondary | ICD-10-CM | POA: Diagnosis not present

## 2019-01-24 DIAGNOSIS — Z7982 Long term (current) use of aspirin: Secondary | ICD-10-CM | POA: Diagnosis not present

## 2019-01-24 DIAGNOSIS — I10 Essential (primary) hypertension: Secondary | ICD-10-CM | POA: Diagnosis not present

## 2019-01-24 DIAGNOSIS — Z951 Presence of aortocoronary bypass graft: Secondary | ICD-10-CM | POA: Diagnosis not present

## 2019-01-24 DIAGNOSIS — Z87442 Personal history of urinary calculi: Secondary | ICD-10-CM | POA: Insufficient documentation

## 2019-01-24 DIAGNOSIS — G4730 Sleep apnea, unspecified: Secondary | ICD-10-CM | POA: Insufficient documentation

## 2019-01-24 DIAGNOSIS — Z8042 Family history of malignant neoplasm of prostate: Secondary | ICD-10-CM | POA: Insufficient documentation

## 2019-01-24 DIAGNOSIS — R69 Illness, unspecified: Secondary | ICD-10-CM | POA: Diagnosis not present

## 2019-01-24 DIAGNOSIS — Z833 Family history of diabetes mellitus: Secondary | ICD-10-CM | POA: Insufficient documentation

## 2019-01-24 DIAGNOSIS — K219 Gastro-esophageal reflux disease without esophagitis: Secondary | ICD-10-CM | POA: Insufficient documentation

## 2019-01-24 DIAGNOSIS — N200 Calculus of kidney: Secondary | ICD-10-CM

## 2019-01-24 DIAGNOSIS — Z1159 Encounter for screening for other viral diseases: Secondary | ICD-10-CM | POA: Diagnosis not present

## 2019-01-24 DIAGNOSIS — Z884 Allergy status to anesthetic agent status: Secondary | ICD-10-CM | POA: Insufficient documentation

## 2019-01-24 DIAGNOSIS — Z7902 Long term (current) use of antithrombotics/antiplatelets: Secondary | ICD-10-CM | POA: Insufficient documentation

## 2019-01-24 DIAGNOSIS — F17200 Nicotine dependence, unspecified, uncomplicated: Secondary | ICD-10-CM | POA: Insufficient documentation

## 2019-01-24 DIAGNOSIS — R569 Unspecified convulsions: Secondary | ICD-10-CM | POA: Insufficient documentation

## 2019-01-24 DIAGNOSIS — N202 Calculus of kidney with calculus of ureter: Secondary | ICD-10-CM | POA: Diagnosis not present

## 2019-01-24 DIAGNOSIS — E7800 Pure hypercholesterolemia, unspecified: Secondary | ICD-10-CM | POA: Insufficient documentation

## 2019-01-24 DIAGNOSIS — Z79899 Other long term (current) drug therapy: Secondary | ICD-10-CM | POA: Insufficient documentation

## 2019-01-24 DIAGNOSIS — F329 Major depressive disorder, single episode, unspecified: Secondary | ICD-10-CM | POA: Insufficient documentation

## 2019-01-24 DIAGNOSIS — Z880 Allergy status to penicillin: Secondary | ICD-10-CM | POA: Insufficient documentation

## 2019-01-24 DIAGNOSIS — Z955 Presence of coronary angioplasty implant and graft: Secondary | ICD-10-CM | POA: Insufficient documentation

## 2019-01-24 DIAGNOSIS — N132 Hydronephrosis with renal and ureteral calculous obstruction: Secondary | ICD-10-CM | POA: Insufficient documentation

## 2019-01-24 DIAGNOSIS — I2510 Atherosclerotic heart disease of native coronary artery without angina pectoris: Secondary | ICD-10-CM | POA: Diagnosis not present

## 2019-01-24 DIAGNOSIS — N201 Calculus of ureter: Secondary | ICD-10-CM | POA: Diagnosis not present

## 2019-01-24 DIAGNOSIS — J45909 Unspecified asthma, uncomplicated: Secondary | ICD-10-CM | POA: Insufficient documentation

## 2019-01-24 HISTORY — PX: CYSTOSCOPY/URETEROSCOPY/HOLMIUM LASER/STENT PLACEMENT: SHX6546

## 2019-01-24 LAB — GLUCOSE, CAPILLARY
Glucose-Capillary: 86 mg/dL (ref 70–99)
Glucose-Capillary: 91 mg/dL (ref 70–99)

## 2019-01-24 SURGERY — CYSTOSCOPY/URETEROSCOPY/HOLMIUM LASER/STENT PLACEMENT
Anesthesia: General | Laterality: Left

## 2019-01-24 MED ORDER — FENTANYL CITRATE (PF) 100 MCG/2ML IJ SOLN
INTRAMUSCULAR | Status: DC | PRN
  Administered 2019-01-24: 25 ug via INTRAVENOUS
  Administered 2019-01-24: 50 ug via INTRAVENOUS
  Administered 2019-01-24: 25 ug via INTRAVENOUS

## 2019-01-24 MED ORDER — OXYCODONE HCL 5 MG PO TABS: 5.0000 mg | ORAL_TABLET | Freq: Once | ORAL | Status: DC | PRN

## 2019-01-24 MED ORDER — LACTATED RINGERS IV SOLN
INTRAVENOUS | Status: DC
  Administered 2019-01-24: 13:00:00 via INTRAVENOUS

## 2019-01-24 MED ORDER — SODIUM CHLORIDE 0.9 % IR SOLN
Status: DC | PRN
  Administered 2019-01-24: 3000 mL

## 2019-01-24 MED ORDER — BELLADONNA ALKALOIDS-OPIUM 16.2-60 MG RE SUPP
RECTAL | Status: DC | PRN
  Administered 2019-01-24: 1 via RECTAL

## 2019-01-24 MED ORDER — FENTANYL CITRATE (PF) 100 MCG/2ML IJ SOLN
25.0000 ug | INTRAMUSCULAR | Status: DC | PRN
  Administered 2019-01-24 (×2): 50 ug via INTRAVENOUS

## 2019-01-24 MED ORDER — ACETAMINOPHEN 10 MG/ML IV SOLN: 1000.0000 mg | Freq: Once | INTRAVENOUS | Status: DC | PRN

## 2019-01-24 MED ORDER — KETOROLAC TROMETHAMINE 30 MG/ML IJ SOLN
INTRAMUSCULAR | Status: AC
  Filled 2019-01-24: qty 1

## 2019-01-24 MED ORDER — SCOPOLAMINE 1 MG/3DAYS TD PT72
MEDICATED_PATCH | TRANSDERMAL | Status: DC | PRN
  Administered 2019-01-24: 1 via TRANSDERMAL

## 2019-01-24 MED ORDER — CIPROFLOXACIN IN D5W 400 MG/200ML IV SOLN
400.0000 mg | INTRAVENOUS | Status: AC
  Administered 2019-01-24: 400 mg via INTRAVENOUS
  Filled 2019-01-24: qty 200

## 2019-01-24 MED ORDER — DEXAMETHASONE SODIUM PHOSPHATE 10 MG/ML IJ SOLN
INTRAMUSCULAR | Status: DC | PRN
  Administered 2019-01-24: 10 mg via INTRAVENOUS

## 2019-01-24 MED ORDER — PROPOFOL 10 MG/ML IV BOLUS
INTRAVENOUS | Status: DC | PRN
  Administered 2019-01-24: 120 mg via INTRAVENOUS

## 2019-01-24 MED ORDER — MIDAZOLAM HCL 5 MG/5ML IJ SOLN
INTRAMUSCULAR | Status: DC | PRN
  Administered 2019-01-24 (×2): 1 mg via INTRAVENOUS

## 2019-01-24 MED ORDER — CIPROFLOXACIN HCL 500 MG PO TABS: 500.0000 mg | ORAL_TABLET | Freq: Once | ORAL | 0 refills | Status: AC

## 2019-01-24 MED ORDER — PROMETHAZINE HCL 25 MG/ML IJ SOLN: 6.2500 mg | INTRAMUSCULAR | Status: DC | PRN

## 2019-01-24 MED ORDER — KETOROLAC TROMETHAMINE 30 MG/ML IJ SOLN
INTRAMUSCULAR | Status: DC | PRN
  Administered 2019-01-24: 30 mg via INTRAVENOUS

## 2019-01-24 MED ORDER — LIDOCAINE HCL (CARDIAC) PF 100 MG/5ML IV SOSY
PREFILLED_SYRINGE | INTRAVENOUS | Status: DC | PRN
  Administered 2019-01-24: 40 mg via INTRAVENOUS
  Administered 2019-01-24: 20 mg via INTRAVENOUS

## 2019-01-24 MED ORDER — BELLADONNA ALKALOIDS-OPIUM 16.2-30 MG RE SUPP
RECTAL | Status: AC
  Filled 2019-01-24: qty 1

## 2019-01-24 MED ORDER — FENTANYL CITRATE (PF) 100 MCG/2ML IJ SOLN
INTRAMUSCULAR | Status: AC
  Filled 2019-01-24: qty 2

## 2019-01-24 MED ORDER — IOHEXOL 300 MG/ML  SOLN
INTRAMUSCULAR | Status: DC | PRN
  Administered 2019-01-24: 5 mL

## 2019-01-24 MED ORDER — CLOPIDOGREL BISULFATE 75 MG PO TABS: 75.0000 mg | ORAL_TABLET | Freq: Every day | ORAL | Status: DC

## 2019-01-24 MED ORDER — 0.9 % SODIUM CHLORIDE (POUR BTL) OPTIME
TOPICAL | Status: DC | PRN
  Administered 2019-01-24: 1000 mL

## 2019-01-24 MED ORDER — OXYCODONE HCL 5 MG/5ML PO SOLN: 5.0000 mg | Freq: Once | ORAL | Status: DC | PRN

## 2019-01-24 MED ORDER — PROPOFOL 10 MG/ML IV BOLUS
INTRAVENOUS | Status: AC
  Filled 2019-01-24: qty 20

## 2019-01-24 MED ORDER — TRAMADOL HCL 50 MG PO TABS: 50.0000 mg | ORAL_TABLET | Freq: Four times a day (QID) | ORAL | 0 refills | Status: DC | PRN

## 2019-01-24 MED ORDER — ONDANSETRON HCL 4 MG/2ML IJ SOLN
INTRAMUSCULAR | Status: AC
  Filled 2019-01-24: qty 2

## 2019-01-24 MED ORDER — ONDANSETRON HCL 4 MG/2ML IJ SOLN
INTRAMUSCULAR | Status: DC | PRN
  Administered 2019-01-24: 4 mg via INTRAVENOUS

## 2019-01-24 MED ORDER — SCOPOLAMINE 1 MG/3DAYS TD PT72
MEDICATED_PATCH | TRANSDERMAL | Status: AC
  Filled 2019-01-24: qty 1

## 2019-01-24 MED ORDER — MIDAZOLAM HCL 2 MG/2ML IJ SOLN
INTRAMUSCULAR | Status: AC
  Filled 2019-01-24: qty 2

## 2019-01-24 MED ORDER — PHENAZOPYRIDINE HCL 200 MG PO TABS: 200.0000 mg | ORAL_TABLET | Freq: Three times a day (TID) | ORAL | 0 refills | Status: DC | PRN

## 2019-01-24 SURGICAL SUPPLY — 21 items
BAG URO CATCHER STRL LF (MISCELLANEOUS) ×2 IMPLANT
BASKET ZERO TIP NITINOL 2.4FR (BASKET) IMPLANT
CATH URET 5FR 28IN OPEN ENDED (CATHETERS) ×2 IMPLANT
CATH URET DUAL LUMEN 6-10FR 50 (CATHETERS) ×2 IMPLANT
CLOTH BEACON ORANGE TIMEOUT ST (SAFETY) IMPLANT
COVER WAND RF STERILE (DRAPES) IMPLANT
EXTRACTOR STONE 1.7FRX115CM (UROLOGICAL SUPPLIES) ×2 IMPLANT
FIBER LASER TRAC TIP (UROLOGICAL SUPPLIES) ×2 IMPLANT
GLOVE BIOGEL M STRL SZ7.5 (GLOVE) ×2 IMPLANT
GOWN STRL REUS W/TWL XL LVL3 (GOWN DISPOSABLE) ×2 IMPLANT
GUIDEWIRE ANG ZIPWIRE 038X150 (WIRE) IMPLANT
GUIDEWIRE STR DUAL SENSOR (WIRE) ×2 IMPLANT
KIT TURNOVER KIT A (KITS) IMPLANT
MANIFOLD NEPTUNE II (INSTRUMENTS) ×2 IMPLANT
PACK CYSTO (CUSTOM PROCEDURE TRAY) ×2 IMPLANT
SHEATH URETERAL 12FRX28CM (UROLOGICAL SUPPLIES) IMPLANT
SHEATH URETERAL 12FRX35CM (MISCELLANEOUS) IMPLANT
STENT POLARIS 5FRX22 (STENTS) ×2 IMPLANT
TUBING CONNECTING 10 (TUBING) ×2 IMPLANT
TUBING UROLOGY SET (TUBING) ×2 IMPLANT
WIRE COONS/BENSON .038X145CM (WIRE) ×2 IMPLANT

## 2019-01-24 NOTE — Transfer of Care (Signed)
Immediate Anesthesia Transfer of Care Note  Patient: Samantha Huffman  Procedure(s) Performed: CYSTOSCOPY, LEFT URETEROSCOPY, HOLMIUM LASER, STONE REMOVAL AND STENT EXCHANGE (Left )  Patient Location: PACU  Anesthesia Type:General  Level of Consciousness: drowsy, patient cooperative and responds to stimulation  Airway & Oxygen Therapy: Patient Spontanous Breathing and Patient connected to face mask oxygen  Post-op Assessment: Report given to RN and Post -op Vital signs reviewed and stable  Post vital signs: Reviewed and stable  Last Vitals:  Vitals Value Taken Time  BP 151/87 01/24/2019  3:13 PM  Temp    Pulse 59 01/24/2019  3:14 PM  Resp 22 01/24/2019  3:14 PM  SpO2 98 % 01/24/2019  3:14 PM  Vitals shown include unvalidated device data.  Last Pain:  Vitals:   01/24/19 1318  TempSrc: Oral  PainSc:       Patients Stated Pain Goal: 4 (75/10/25 8527)  Complications: No apparent anesthesia complications

## 2019-01-24 NOTE — Interval H&P Note (Signed)
History and Physical Interval Note:  01/24/2019 1:22 PM  Kareem Aul  has presented today for surgery, with the diagnosis of LEFT URETERAL STONE.  The various methods of treatment have been discussed with the patient and family. After consideration of risks, benefits and other options for treatment, the patient has consented to  Procedure(s): LEFT URETEROSCOPY HOLMIUM LASER STONE REMOVAL AND STENT EXCHANGE (Left) as a surgical intervention.  The patient's history has been reviewed, patient examined, no change in status, stable for surgery.  I have reviewed the patient's chart and labs.  Questions were answered to the patient's satisfaction.     Ardis Hughs

## 2019-01-24 NOTE — H&P (Signed)
Eval of flank pain  HPI: Samantha Huffman is a 63 year-old female established patient who is here for further eval and management of flank pain.  The problem is on the left side. Her pain started about 01/08/2019. The pain is sharp. The pain is constant. The pain does radiate.   Pain medications makes the pain better. Walking, lifting, and twisting makes the pain worse. She was treated with the following pain medication(s): Oxycodone.   She has had this same pain previously. She has had kidney stones. She did see the blood in her urine. The patient has developed frequency and incontinence. She has had fever and chills.   The patient has had a low-grade fever initially, but no fever since that time. She was seen in the urgent care and was not prescribed antibiotics. Her pain has been persistent. She is having associated nausea. She denies any fevers or chills. She denies any dysuria.   The patient is on Plavix for coronary artery disease. She had stents placed in 2014 and had a 4 vessel bypass. She recently stopped the Plavix for a colonoscopy without any significant issues.     ALLERGIES: Anesthesia - Vomiting penicillin - rash on hands & feet    MEDICATIONS: Metformin Hcl 500 mg tablet  Acetaminophen 325 mg tablet  Alprazolam 0.25 mg tablet  Aspirin Ec 81 mg tablet, delayed release Oral  Carvedilol 12.5 mg tablet  Clopidogrel 75 mg tablet  Escitalopram Oxalate 10 mg tablet Oral  Ezetimibe 10 mg tablet  Furosemide 20 mg tablet  Isosorbide Mononitrate Er 120 mg tablet, extended release 24 hr  Levetiracetam 500 mg tablet Oral  Lisinopril 20 mg tablet Oral  Metoprolol Succinate 200 mg tablet, extended release 24 hr Oral  Nitroglycerin 0.4 mg tablet, sublingual  Oxycodone-Acetaminophen 5 mg-325 mg tablet  Pantoprazole Sodium 40 mg tablet, delayed release  Tylenol  Zofran     GU PSH: ESWL - 2016      PSH Notes: Renal Lithotripsy   NON-GU PSH: CABG (coronary artery bypass  grafting) Cardiac Stent Placement - 2014    GU PMH: Renal calculus, Nephrolithiasis - 2016      PMH Notes:  2015-08-13 11:55:00 - Note: Seizure   NON-GU PMH: Hypercalciuria, Hypercalciuria - 2017 Encounter for general adult medical examination without abnormal findings, Encounter for preventive health examination - 2016 Personal history of other diseases of the circulatory system, History of hypertension - 2016, History of cardiac disorder, - 2016 Personal history of other diseases of the nervous system and sense organs, History of sleep apnea - 2016 Personal history of other diseases of the respiratory system, History of asthma - 2016 Personal history of other endocrine, nutritional and metabolic disease, History of hypercholesterolemia - 2016 Personal history of other mental and behavioral disorders, History of depression - 2016 Diabetes Type 2    FAMILY HISTORY: Death of family member - Runs In Family Diabetes - Runs In Family Prostate Cancer - Runs In Family Seizure - Runs In Family   SOCIAL HISTORY: Marital Status: Single Preferred Language: English; Ethnicity: Not Hispanic Or Latino; Race: Black or African American Current Smoking Status: Patient smokes occasionally.   Tobacco Use Assessment Completed: Used Tobacco in last 30 days? Social Drinker.  Drinks 1 caffeinated drink per day.    REVIEW OF SYSTEMS:    GU Review Female:   Patient reports frequent urination. Patient denies hard to postpone urination, burning /pain with urination, get up at night to urinate, leakage of urine, stream starts and  stops, trouble starting your stream, have to strain to urinate, and being pregnant.  Gastrointestinal (Upper):   Patient reports nausea and vomiting. Patient denies indigestion/ heartburn.  Gastrointestinal (Lower):   Patient denies diarrhea and constipation.  Constitutional:   Patient denies night sweats, weight loss, fatigue, and fever.  Skin:   Patient denies skin rash/  lesion and itching.  Eyes:   Patient denies blurred vision and double vision.  Ears/ Nose/ Throat:   Patient denies sore throat and sinus problems.  Hematologic/Lymphatic:   Patient denies swollen glands and easy bruising.  Cardiovascular:   Patient denies leg swelling and chest pains.  Respiratory:   Patient denies cough and shortness of breath.  Endocrine:   Patient denies excessive thirst.  Musculoskeletal:   Patient reports back pain. Patient denies joint pain.  Neurological:   Patient denies headaches and dizziness.  Psychologic:   Patient denies depression and anxiety.   VITAL SIGNS:      01/16/2019 10:35 AM  Weight 148 lb / 67.13 kg  BP 161/76 mmHg  Pulse 73 /min  Temperature 99.3 F / 37.3 C   MULTI-SYSTEM PHYSICAL EXAMINATION:    Constitutional: Well-nourished. No physical deformities. Normally developed. Good grooming.  Neck: Neck symmetrical, not swollen. Normal tracheal position.  Respiratory: Normal breath sounds. No labored breathing, no use of accessory muscles.   Cardiovascular: Regular rate and rhythm. No murmur, no gallop. Normal temperature, normal extremity pulses, no swelling, no varicosities.   Lymphatic: No enlargement of neck, axillae, groin.  Skin: No paleness, no jaundice, no cyanosis. No lesion, no ulcer, no rash.  Neurologic / Psychiatric: Oriented to time, oriented to place, oriented to person. No depression, no anxiety, no agitation.  Gastrointestinal: No mass, no tenderness, no rigidity, non obese abdomen.  Eyes: Normal conjunctivae. Normal eyelids.  Ears, Nose, Mouth, and Throat: Left ear no scars, no lesions, no masses. Right ear no scars, no lesions, no masses. Nose no scars, no lesions, no masses. Normal hearing. Normal lips.  Musculoskeletal: Normal gait and station of head and neck.     PAST DATA REVIEWED:  Source Of History:  Patient  Records Review:   Previous Doctor Records, Previous Patient Records, POC Tool  X-Ray Review: KUB: Reviewed  Films. Discussed With Patient.     PROCEDURES:         C.T. Urogram - [id number]  The patient has a 3 mm stone in the left proximal ureter. She has nonobstructing stones in the right kidney which are very small.      Patient confirmed No Neulasta OnPro Device.           Urinalysis w/Scope Dipstick Dipstick Cont'd Micro  Color: Yellow Bilirubin: Neg mg/dL WBC/hpf: 6 - 10/hpf  Appearance: Cloudy Ketones: Neg mg/dL RBC/hpf: 3 - 10/hpf  Specific Gravity: 1.010 Blood: 3+ ery/uL Bacteria: Rare (0-9/hpf)  pH: 6.0 Protein: Neg mg/dL Cystals: NS (Not Seen)  Glucose: Neg mg/dL Urobilinogen: 0.2 mg/dL Casts: NS (Not Seen)    Nitrites: Neg Trichomonas: Present    Leukocyte Esterase: 2+ leu/uL Mucous: Not Present      Epithelial Cells: NS (Not Seen)      Yeast: NS (Not Seen)      Sperm: Not Present         Ketoralac 60mg  - [id number], [id number] Qty: 60 Adm. By: Cristobal Goldmann  Unit: mg Lot No [id number]  Route: IM Exp. Date 09/24/2019  Freq: None Mfgr.:   Site: Left Buttock   ASSESSMENT:  ICD-10 Details  1 GU:   Flank Pain - R10.84   2   Renal calculus - N20.0    PLAN:           Orders Labs Urine Culture  X-Rays: C.T. Stone Protocol Without Contrast  X-Ray Notes: History:  Hematuria: Yes/No  Patient to see MD after exam: Yes/No  Previous exam: CT / IVP/ US/ KUB/ None  When:  Where:  Diabetic: Yes/ No  BUN/ Creatinine:  Date of last BUN Creatinine:  Weight in pounds:  Allergy- IV Contrast: Yes/ No  Conflicting diabetic meds: Yes/ No  Diabetic Meds:  Prior Authorization #: [id number]           Schedule         Document Letter(s):  Created for Patient: Clinical Summary         Notes:   The patient has symptomatic renal colic which she has had for the last 4 days because of a 3 mm obstructing left proximal ureteral stone. She is eager to have some relief of her pain. Unfortunately she is on Plavix and as such cannot have shockwave therapy within the next 5 days. We  discussed treatment options beyond shockwave lithotripsy and at this point I recommended that we place a stent today and the stop the patient's Plavix namely because of the proximal stone. I suspect that treating the stone today would be difficult because of her anticoagulation and her tight ureter with the associated risks of bleeding.   Once the patient has been stented and she has had time to stop her Plavix we will take her to the OR and remove the stone exchange the stent.

## 2019-01-24 NOTE — Op Note (Signed)
Preoperative diagnosis: left ureteral calculus  Postoperative diagnosis: left ureteral calculus  Procedure:  1. Cystoscopy 2. left ureteroscopy and stone removal 3. Ureteroscopic laser lithotripsy 4. left 2F x 22 ureteral stent placement  5. left retrograde pyelography with interpretation  Surgeon: Ardis Hughs, MD  Anesthesia: General  Complications: None  Intraoperative findings: #1: Patient's bladder was normal-appearing with several blood clots in the area around the stent.  Stent was emanating from the patient's left ureter. 2.:  Ureteroscopy demonstrated normal ureteral mucosa was an impacted stone at the proximal/UPJ of the left ureter.  Stent was fragmented into several smaller pieces and removed. 3.:  Retrograde pyelogram was performed at the end of the case which demonstrated no additional filling defects with some mild hydroureteronephrosis but no blunting or additional stones.  EBL: Minimal  Specimens: 1. left ureteral calculus  Disposition of specimens: [facility] for stone analysis  Indication: Samantha Huffman is a 63 y.o.   patient with a previously stented left ureteral stone.  She initially presented to clinic in extremis and opted to proceed with left stent placement.  Her Plavix was then stopped she was followed up 1 week later for completion ureteroscopy.  After reviewing the management options for treatment, the patient elected to proceed with the above surgical procedure(s). We have discussed the potential benefits and risks of the procedure, side effects of the proposed treatment, the likelihood of the patient achieving the goals of the procedure, and any potential problems that might occur during the procedure or recuperation. Informed consent has been obtained.   Description of procedure:  The patient was taken to the operating room and general anesthesia was induced.  The patient was placed in the dorsal lithotomy position, prepped  and draped in the usual sterile fashion, and preoperative antibiotics were administered. A preoperative time-out was performed.   Cystourethroscopy was performed.  The patient's urethra was examined and was normal. The bladder was then systematically examined in its entirety. There was no evidence for any bladder tumors, stones, or other mucosal pathology.    Attention then turned to the left ureteral orifice and a ureteral catheter and the stent was grasped with the stent grasper and brought to the urethral meatus.  A 0.38 sensor wire was then advanced through the stent and up in the left renal pelvis.  This then advanced a dual-lumen rigid ureteroscope through the patient's bladder and into the left ureter.  I was able to encounter the stone into patient proximal/UPJ region.  I tried passing the stone unsuccessfully.  Stone was noted to be impacted into the lateral sidewall.  I then noticed the stone back into the patient kidney and remove the semirigid ureteroscope.  I then advanced a second wire into the patient's left collecting system and over the second wire advanced a flexible ureteroscopeover the wire and into the patient's left pelvis.  The stent was easily encountered.   The stone was then fragmented with the 200 micron holmium laser fiber on a setting of 0.6 and frequency of 6 Hz.   All stones were then removed from the renal pelvis and ureter with an N-gage nitinol basket.  Reinspection of the ureter revealed no remaining visible stones or fragments.   The wire was then backloaded through the cystoscope and a ureteral stent was advance over the wire using Seldinger technique.  The stent was positioned appropriately under fluoroscopic and cystoscopic guidance.  The wire was then removed with an adequate stent curl noted in the  renal pelvis as well as in the bladder.  The bladder was then emptied and the procedure ended.  The patient appeared to tolerate the procedure well and without  complications.  The patient was able to be awakened and transferred to the recovery unit in satisfactory condition.   Disposition: The tether of the stent was left on andtucked inside the patient's vagina.  Instructions for removing the stent have been provided to the patient. The patient has been scheduled for followup in 6 weeks with a renal ultrasound.  Monday June 8.

## 2019-01-24 NOTE — Anesthesia Postprocedure Evaluation (Addendum)
Anesthesia Post Note  Patient: Samantha Huffman  Procedure(s) Performed: CYSTOSCOPY, LEFT URETEROSCOPY, HOLMIUM LASER, STONE REMOVAL AND STENT EXCHANGE (Left )     Patient location during evaluation: PACU Anesthesia Type: General Level of consciousness: awake and alert Pain management: pain level controlled Vital Signs Assessment: post-procedure vital signs reviewed and stable Respiratory status: spontaneous breathing, nonlabored ventilation and respiratory function stable Cardiovascular status: blood pressure returned to baseline and stable Postop Assessment: no apparent nausea or vomiting Anesthetic complications: no    Last Vitals:  Vitals:   01/24/19 1600 01/24/19 1611  BP: 135/89 (!) 150/77  Pulse: 68 77  Resp: 18   Temp:    SpO2: 96% 94%    Last Pain:  Vitals:   01/24/19 1615  TempSrc:   PainSc: [place]

## 2019-01-24 NOTE — Anesthesia Procedure Notes (Signed)
Procedure Name: LMA Insertion Date/Time: 01/24/2019 1:50 PM Performed by: Glory Buff, CRNA Pre-anesthesia Checklist: Patient identified, Emergency Drugs available, Suction available and Patient being monitored Patient Re-evaluated:Patient Re-evaluated prior to induction Oxygen Delivery Method: Circle system utilized Preoxygenation: Pre-oxygenation with 100% oxygen Induction Type: IV induction LMA: LMA inserted LMA Size: 4.0 Number of attempts: 2 Placement Confirmation: positive ETCO2 Tube secured with: Tape Dental Injury: Teeth and Oropharynx as per pre-operative assessment

## 2019-01-24 NOTE — Progress Notes (Signed)
Pt dressed and ready for discharge. Waiting for daughter to transport home

## 2019-01-24 NOTE — Discharge Instructions (Signed)
DISCHARGE INSTRUCTIONS FOR KIDNEY STONE/URETERAL STENT   MEDICATIONS:  1. Resume all your other meds from home - except do not take any extra narcotic pain meds that you may have at home.  2. Pyridium is to help with the burning/stinging when you urinate. 3. Tramadol is for moderate/severe pain, otherwise taking upto 1000 mg every 6 hours of plainTylenol will help treat your pain.   4. Take Cipro one hour prior to removal of your stent.   ACTIVITY:  1. No strenuous activity x 1week  2. No driving while on narcotic pain medications  3. Drink plenty of water  4. Continue to walk at home - you can still get blood clots when you are at home, so keep active, but don't over do it.  5. May return to work/school tomorrow or when you feel ready   BATHING:  1. You can shower and we recommend daily showers  2. You have a string coming from your urethra: The stent string is attached to your ureteral stent. Do not pull on this.   SIGNS/SYMPTOMS TO CALL:  Please call us if you have a fever greater than 101.5, uncontrolled nausea/vomiting, uncontrolled pain, dizziness, unable to urinate, bloody urine, chest pain, shortness of breath, leg swelling, leg pain, redness around wound, drainage from wound, or any other concerns or questions.   You can reach [place] at [phone].   FOLLOW-UP:  1. You have an appointment in 6 weeks with a ultrasound of your kidneys prior.   2. You have a string attached to your stent, you may remove it on Monday June 8th. To do this, pull the strings until the stents are completely removed. You may feel an odd sensation in your back.

## 2019-01-25 ENCOUNTER — Encounter (HOSPITAL_COMMUNITY): Payer: Self-pay | Admitting: Urology

## 2019-01-25 LAB — NOVEL CORONAVIRUS, NAA (HOSP ORDER, SEND-OUT TO REF LAB; TAT 18-24 HRS): SARS-CoV-2, NAA: NOT DETECTED

## 2019-01-26 ENCOUNTER — Ambulatory Visit: Payer: Medicare HMO | Admitting: Cardiology

## 2019-02-09 DIAGNOSIS — R1084 Generalized abdominal pain: Secondary | ICD-10-CM | POA: Diagnosis not present

## 2019-03-07 DIAGNOSIS — N200 Calculus of kidney: Secondary | ICD-10-CM | POA: Diagnosis not present

## 2019-03-12 ENCOUNTER — Other Ambulatory Visit: Payer: Self-pay

## 2019-03-12 ENCOUNTER — Telehealth: Payer: Self-pay

## 2019-03-12 ENCOUNTER — Encounter: Payer: Self-pay | Admitting: Cardiology

## 2019-03-12 ENCOUNTER — Telehealth: Payer: Medicare HMO | Admitting: Cardiology

## 2019-03-12 VITALS — BP 134/84 | HR 63 | Ht 61.0 in | Wt 149.2 lb

## 2019-03-12 DIAGNOSIS — I6523 Occlusion and stenosis of bilateral carotid arteries: Secondary | ICD-10-CM | POA: Diagnosis not present

## 2019-03-12 DIAGNOSIS — I2510 Atherosclerotic heart disease of native coronary artery without angina pectoris: Secondary | ICD-10-CM

## 2019-03-12 DIAGNOSIS — E785 Hyperlipidemia, unspecified: Secondary | ICD-10-CM | POA: Diagnosis not present

## 2019-03-12 DIAGNOSIS — E119 Type 2 diabetes mellitus without complications: Secondary | ICD-10-CM | POA: Diagnosis not present

## 2019-03-12 DIAGNOSIS — Z716 Tobacco abuse counseling: Secondary | ICD-10-CM

## 2019-03-12 DIAGNOSIS — I10 Essential (primary) hypertension: Secondary | ICD-10-CM

## 2019-03-12 LAB — LIPID PANEL
Chol/HDL Ratio: 3 ratio (ref 0.0–4.4)
Cholesterol, Total: 136 mg/dL (ref 100–199)
HDL: 46 mg/dL (ref >39)
LDL Calculated: 79 mg/dL (ref 0–99)
Triglycerides: 54 mg/dL (ref 0–149)
VLDL Cholesterol Cal: 11 mg/dL (ref 5–40)

## 2019-03-12 LAB — ALT: ALT: 15 IU/L (ref 0–32)

## 2019-03-12 MED ORDER — BUPROPION HCL ER (SR) 150 MG PO TB12: ORAL_TABLET | ORAL | 6 refills | Status: DC

## 2019-03-12 NOTE — Progress Notes (Signed)
Cardiology Office Note:    Date:  03/12/2019   ID:  Infantof Samantha Huffman, DOB 05/06/1956, MRN [id number]  PCP: Alroy Dust, Carlean Jews.Marlou Sa, MD  Cardiologist: Fransico Him, MD  Electrophysiologist: None  Chief Complaint: CAD, carotid artery stenosis, HLD, HTN   History of Present Illness:   Samantha Huffman is a 63 y.o. female who presents via audio/video conferencing for a telehealth visit today.  Samantha Huffman is a 63 y.o. female history of CAD status post CABG 10/2012 [facility], abnormal stress test 2017 but cath showed patent grafts to the OM and LAD and 30% RCA normal LV function. Patient has had periodic chest pain and was placed on Imdur for possible microvascular angina. PCP was treating her for anxiety. 2D echo 07/2018 EF 60 to 65% with grade 1 DD.  Patient underwent cardiac catheterization 09/05/2019 due to ongoing CP and found to have patent LIMA to the LAD, patent free radial graft to the OM, normal LV function, low LV filling pressures. Chlorthalidone and Lasix were held because of low filling pressures and blood pressure otherwise medical therapy continued.   She is here today for followup and is doing well. She denies any chest pain or pressure, SOB, DOE, PND, orthopnea, LE edema, dizziness, palpitations or syncope. She is compliant with her meds and is tolerating meds with no SE. She continues to smoke and wants to quit but says that she wants to try a smoking cessation drug. She had nausea on Chantix.   The patient does not have symptoms concerning for COVID-19 infection (fever, chills, cough, or new shortness of breath).   Prior CV studies:   The following studies were reviewed today:  none      Past Medical History:  Diagnosis Date  . Carotid artery stenosis    1-39% bilateral stenosis by dopplers 12/2017  . Coronary artery disease    a. s/p CABG 10/2012. cath 05/2016 with moderate LM stenosis, mild LCx and RCA stenosis and occluded LAD with patent LIMA to LAD, free radial to OM. She  is felt to have microvascular disease.  Marland Kitchen Dyslipidemia   . Family history of adverse reaction to anesthesia    mother and sister ponv  . Generalized convulsive epilepsy without mention of intractable epilepsy   . GERD (gastroesophageal reflux disease)   . History of kidney stones   . Hypertension   . Memory deficit    slight  . Migraine    "controlled on daily RX " (06/11/2016)  . Nephrolithiasis 07/30/2015   S/p lithotripsy x 3 and right and left ureter stents  . OSA (obstructive sleep apnea)    cpap   . PONV (postoperative nausea and vomiting)   . Seizures ([place])    "controlled w/daily RX; started in 2012; dr thinks they might be from the migraines; last sz was early part of 2016" (06/11/2016)  . Type II diabetes mellitus (HCC)    metformin  . Vasovagal syncope 07/30/2015   2011        Past Surgical History:  Procedure Laterality Date  . CARDIAC CATHETERIZATION  2014; 06/11/2016  . CARDIAC CATHETERIZATION N/A 06/11/2016   Procedure: Left Heart Cath and Cors/Grafts Angiography; Surgeon: Sherren Mocha, MD; Location: [place] [facility]; Service: Cardiovascular; Laterality: N/A;  . CESAREAN SECTION    . CORONARY ANGIOPLASTY    . CORONARY ARTERY BYPASS GRAFT  March, 2014   LIMA to LAD, left radial to LCx x 2  . CYSTOSCOPY W/ URETERAL STENT PLACEMENT Left 01/16/2019   Procedure: CYSTOSCOPY  WITH RETROGRADE PYELOGRAM/URETERAL LEFT STENT PLACEMENT; Surgeon: Ardis Hughs, MD; Location: [facility]; Service: Urology; Laterality: Left;  . CYSTOSCOPY/URETEROSCOPY/HOLMIUM LASER/STENT PLACEMENT Left 01/24/2019   Procedure: CYSTOSCOPY, LEFT URETEROSCOPY, HOLMIUM LASER, STONE REMOVAL AND STENT EXCHANGE; Surgeon: Ardis Hughs, MD; Location: [facility]; Service: Urology; Laterality: Left;  . LITHOTRIPSY  X 3  . RIGHT/LEFT HEART CATH AND CORONARY/GRAFT ANGIOGRAPHY N/A 09/08/2018   Procedure: RIGHT/LEFT HEART CATH AND CORONARY/GRAFT ANGIOGRAPHY; Surgeon: Martinique, Peter M, MD; Location: [place]  [facility]; Service: Cardiovascular; Laterality: N/A;  . TOTAL ABDOMINAL HYSTERECTOMY     ovaries took before hysterectomy  . TUBAL LIGATION    . URETERAL STENT PLACEMENT       Allergies: Depakote [divalproex sodium] and Penicillins   Social History        Tobacco Use  . Smoking status: Current Some Day Smoker    Packs/day: 0.33    Years: 35.00    Pack years: 11.55    Types: Cigarettes    Last attempt to quit: 06/11/2016    Years since quitting: 2.7  . Smokeless tobacco: Former Systems developer    Quit date: 11/20/2012  Substance Use Topics  . Alcohol use: Yes    Comment: 06/11/2016 "might have a few drinks/month; usually wine"  . Drug use: No   Family Hx:  The patient's family history includes Cancer in her father; Diabetes in her brother, brother, and father; Heart attack in her sister; Heart disease in her brother and sister; Stroke in her brother.   ROS:  Please see the history of present illness.  All other systems reviewed and are negative.   Labs/Other Tests and Data Reviewed:   Recent Labs:  08/14/2018: NT-Pro BNP 101; TSH 0.959  01/22/2019: BUN 12; Creatinine, Ser 0.85; Hemoglobin 12.1; Platelets 183; Potassium 4.4; Sodium 139  Recent Lipid Panel  Labs (Brief)                                                                    Wt Readings from Last 3 Encounters:  03/12/19 149 lb 3.2 oz (67.7 kg)  01/24/19 148 lb (67.1 kg)  01/22/19 148 lb (67.1 kg)   Objective:   Vital Signs: BP 134/84  Pulse 63  Ht 5\' 1"  (1.549 m)  Wt 149 lb 3.2 oz (67.7 kg)  SpO2 98%  BMI 28.19 kg/m  GEN: Well nourished, well developed in no acute distress HEENT: Normal NECK: No JVD; No carotid bruits LYMPHATICS: No lymphadenopathy CARDIAC:RRR, no murmurs, rubs, gallops RESPIRATORY:  Clear to auscultation without rales, wheezing or rhonchi  ABDOMEN: Soft, non-tender, non-distended MUSCULOSKELETAL:  No edema; No deformity  SKIN: Warm and dry NEUROLOGIC:  Alert and oriented x 3  PSYCHIATRIC:  Normal affect    ASSESSMENT & PLAN:   1. ASCAD  -CABG x2 in 2014 with LIMA to the LAD and free radial to the OM with recent cath 09/04/2018 with patent LIMA to the LAD and patent free radial graft to the OM.  -she has not had any anginal sx since her cath  -she will continue on ASA 81mg  daily, high dose statin, carvedilol 12.5mg  BID, Plavix 75mg  daily and Imdur 120mg  daily.   2. HTN  -Her BP has been controlled at home  -She will continue on  Lisinopril 20mg  daily, Imdur 120mg  daily, Carvedilol 12.5mg  BID   3. Hyperlipidemia  -LDL goal is < 70.  -her LDL was 67 in Oct 2019  -She will continue on Zetia 10mg  daily and Atorvastatin 80mg  daily.  -I will recheck an FLP and ALT today since she is fasting   4. Carotid artery stenosis  -dopplers 12/2017 showed 1-39% bilateral stenosis  -continue ASA and statin.   5. Type 2 DM  -this is followed by her PCP  -HbA1C was 7.4 in June  -continue Metformin XR 500mg  daily   6. Tobacco abuse counseling  -she continues to smoke but really wants to stop  -she had nausea on Chantix  -I will start her on Wellbutrin 150mg  daily for 3 days then 150mg  BID    Medication Adjustments/Labs and Tests Ordered: Current medicines are reviewed at length with the patient today.  Concerns regarding medicines are outlined above.  Orders Placed This Encounter  Procedures  . Lipid panel  . ALT   Meds ordered this encounter  Medications  . buPROPion (WELLBUTRIN SR) 150 MG 12 hr tablet    Sig: 1 TAB FOR 3 DAYS THEN INCREASE TO TWICE DAILY    Dispense:  60 tablet    Refill:  6    Signed, Fransico Him, MD  03/12/2019 12:57 PM    [place]

## 2019-03-12 NOTE — Patient Instructions (Addendum)
Your physician has recommended you make the following change in your medication: START WELLBUTRIN 150 [place] 3 DAYS AND INCREASE TO TWICE DAILY THEREAFTER Your physician recommends that you return for lab work in: [place] ALT TODAY  Your physician wants you to follow-up in:   [place] will receive a reminder letter in the mail two months in advance. If you don't receive a letter, please call our office to schedule the follow-up appointment.

## 2019-03-12 NOTE — Telephone Encounter (Signed)

## 2019-03-13 ENCOUNTER — Telehealth: Payer: Self-pay | Admitting: *Deleted

## 2019-03-13 DIAGNOSIS — I2510 Atherosclerotic heart disease of native coronary artery without angina pectoris: Secondary | ICD-10-CM

## 2019-03-13 DIAGNOSIS — E785 Hyperlipidemia, unspecified: Secondary | ICD-10-CM

## 2019-03-13 NOTE — Telephone Encounter (Signed)
Patient would benefit for PCSK9 inhibitor. Please offer patient an appointment in the lipid clinic so we can discuss treatment options. thanks

## 2019-03-13 NOTE — Telephone Encounter (Signed)
I s/w pt and went over the recommendations per Marcelle Overlie, Pharm-D that she may benefit from PCSK9 therapy and to schedule an appt. Pt is agreeable and I have placed a referral in to see our Lipid Clinic at [facility].. Pt is thankful for the call. Pt's phone connection was not very good though I was able to get her to hear that we will call to make appt. Pt thanked me for the call.

## 2019-03-13 NOTE — Telephone Encounter (Signed)
Notes recorded by Michae Kava, CMA on 03/13/2019 at 8:49 AM EDT  Pt has been notified of lab results. Pt aware will forward to our Lipid Clinic for further recommendations per Dr. Radford Pax and will call her back with any new recommendations if any. Pt thanked me for the call.

## 2019-03-14 ENCOUNTER — Telehealth: Payer: Self-pay

## 2019-03-14 NOTE — Telephone Encounter (Signed)
New Message 03-14-19/2:10pm Left Pt detailed voice mail asking for a return call [id number] Lipid Clinic appt./rval

## 2019-03-15 ENCOUNTER — Telehealth: Payer: Self-pay

## 2019-03-15 NOTE — Telephone Encounter (Signed)
New Message On 03-14-19. Left Pt detailed vm asking for a return call [id number] Lipid Clinic Appt./rval

## 2019-03-16 ENCOUNTER — Ambulatory Visit: Payer: Medicare HMO | Admitting: Neurology

## 2019-04-02 NOTE — Progress Notes (Signed)
PATIENT: Samantha Huffman DOB: 1956-03-20  REASON FOR VISIT: follow up HISTORY FROM: patient  HISTORY OF PRESENT ILLNESS: Today 04/03/19  Samantha Huffman is a 63 year old female with seizure disorder that has been well controlled on combination of Topamax and Keppra.  She does report a mild memory disturbance.  She was admitted for a kidney stone 01/24/2019, shortness of breath 09/08/2018. She has been on Topamax since 2017. For her kidney stone, she had a stent placed for a week, then had surgery to have it retrieved. She has had kidney stones all her life. She denies recurrent seizures. She says her memory is stable. She has problems in the grocery store, it takes her a very long time. Her children will go with her and it is much better. She lives alone, but her daughters come and check on her. She drives a car well. Her headaches are under good control. She did have some pain in her head recently when she was upset. Typically her headaches are on the right side of her head, she knows if the pain goes to the left side of her head a seizure is near. She presents for follow-up unaccompanied.   HISTORY 11/11/2017 Dr. Jannifer Franklin: Samantha Huffman is a 63 year old right-handed black female with a history of a seizure disorder that has been well controlled on the combination of Topamax and Keppra.  The patient takes 500 mg twice daily of Keppra, 75 mg twice daily of Topamax.  The patient overall has tolerated the medications fairly well, she does report a mild memory disturbance.  The patient claims that her migraine headaches have essentially disappeared on the Topamax.  She has recently been in the hospital on 2 occasions, on the ninth and then again on 09 November 2017 for atypical chest pain.  The patient will be following up with her cardiologist in the near future.  The patient reports some shortness of breath even with talking.  She is operating a motor vehicle without difficulty.  She does report some occasional insomnia.   She returns to this office for an evaluation.  REVIEW OF SYSTEMS: Out of a complete 14 system review of symptoms, the patient complains only of the following symptoms, and all other reviewed systems are negative.  Seizures, headaches, memory loss  ALLERGIES: Allergies  Allergen Reactions  . Depakote [Divalproex Sodium] Other (See Comments)    Hyperammonemia  . Penicillins Hives    Other reaction(s): GI intolerance Has patient had a PCN reaction causing immediate rash, facial/tongue/throat swelling, SOB or lightheadedness with hypotension: YES Has patient had a PCN reaction causing severe rash involving mucus membranes or skin necrosis: NO Has patient had a PCN reaction that required hospitalizationNO Has patient had a PCN reaction occurring within the last 10 years: NO If all of the above answers are "NO", then may proceed with Cephalosporin use.    HOME MEDICATIONS: Outpatient Medications Prior to Visit  Medication Sig Dispense Refill  . acetaminophen (TYLENOL) 325 MG tablet Take 2 tablets (650 mg total) by mouth every 4 (four) hours as needed for headache or mild pain.    Marland Kitchen ALPRAZolam (XANAX) 0.25 MG tablet Take 0.25 mg by mouth 3 (three) times daily as needed for anxiety.   0  . aspirin EC 81 MG tablet Take 81 mg by mouth daily.    Marland Kitchen atorvastatin (LIPITOR) 80 MG tablet Take 1 tablet (80 mg total) by mouth daily. 90 tablet 3  . buPROPion (WELLBUTRIN SR) 150 MG 12 hr tablet 1  TAB FOR 3 DAYS THEN INCREASE TO TWICE DAILY 60 tablet 6  . carvedilol (COREG) 12.5 MG tablet Take 1 tablet (12.5 mg total) by mouth 2 (two) times daily. 180 tablet 3  . clopidogrel (PLAVIX) 75 MG tablet Take 1 tablet (75 mg total) by mouth daily. Resume once stent has been removed.    . clotrimazole-betamethasone (LOTRISONE) cream Apply 1 application topically daily as needed (skin folds (under breasts)).     Marland Kitchen escitalopram (LEXAPRO) 10 MG tablet Take 10 mg by mouth at bedtime.     Marland Kitchen ezetimibe (ZETIA) 10 MG  tablet TAKE 1 TABLET (10 MG TOTAL) BY MOUTH DAILY. (Patient taking differently: Take 10 mg by mouth at bedtime. ) 90 tablet 2  . furosemide (LASIX) 20 MG tablet Take 20 mg by mouth daily as needed (fluid retention/swelling.).    Marland Kitchen isosorbide mononitrate (IMDUR) 120 MG 24 hr tablet Take 1 tablet (120 mg total) by mouth daily. 90 tablet 3  . levETIRAcetam (KEPPRA) 500 MG tablet TAKE 1 TABLET BY MOUTH TWICE A DAY (Patient taking differently: Take 500 mg by mouth 2 (two) times daily. ) 180 tablet 0  . lisinopril (PRINIVIL,ZESTRIL) 20 MG tablet Take 1 tablet (20 mg total) by mouth daily. 30 tablet 11  . metFORMIN (GLUCOPHAGE-XR) 500 MG 24 hr tablet Take 500 mg by mouth daily with breakfast.   12  . nitroGLYCERIN (NITROSTAT) 0.4 MG SL tablet Place 1 tablet (0.4 mg total) under the tongue every 5 (five) minutes x 3 doses as needed for chest pain. 25 tablet 4  . pantoprazole (PROTONIX) 40 MG tablet TAKE 1 TABLET BY MOUTH EVERY DAY (Patient taking differently: Take 40 mg by mouth daily. ) 90 tablet 2  . topiramate (TOPAMAX) 25 MG tablet TAKE 3 TABLETS (75 MG TOTAL) BY MOUTH 2 (TWO) TIMES DAILY. 540 tablet 1   No facility-administered medications prior to visit.     PAST MEDICAL HISTORY: Past Medical History:  Diagnosis Date  . Carotid artery stenosis    1-39% bilateral stenosis by dopplers 12/2017  . Coronary artery disease    a. s/p CABG 10/2012.  cath 05/2016 with moderate LM stenosis, mild LCx and RCA stenosis and occluded LAD with patent LIMA to LAD, free radial to OM.  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  Marland Kitchen Dyslipidemia   . Family history of adverse reaction to anesthesia    mother and sister ponv  . Generalized convulsive epilepsy without mention of intractable epilepsy   . GERD (gastroesophageal reflux disease)   . History of kidney stones   . Hypertension   . Memory deficit    slight  . Migraine    "controlled on daily RX " (06/11/2016)  . Nephrolithiasis 07/30/2015   S/p  lithotripsy x 3 and right and left ureter stents  . OSA (obstructive sleep apnea)    cpap   . PONV (postoperative nausea and vomiting)   . Seizures ([place])    "controlled w/daily RX; started in 2012; dr thinks they might be from the migraines; last sz was early part of 2016" (06/11/2016)  . Type II diabetes mellitus (HCC)    metformin  . Vasovagal syncope 07/30/2015   2011    PAST SURGICAL HISTORY: Past Surgical History:  Procedure Laterality Date  . CARDIAC CATHETERIZATION  2014; 06/11/2016  . CARDIAC CATHETERIZATION N/A 06/11/2016   Procedure: Left Heart Cath and Cors/Grafts Angiography;  Surgeon: Sherren Mocha, MD;  Location: [facility];  Service: Cardiovascular;  Laterality:  N/A;  . CESAREAN SECTION    . CORONARY ANGIOPLASTY    . CORONARY ARTERY BYPASS GRAFT  March, 2014   LIMA to LAD, left radial to LCx x 2  . CYSTOSCOPY W/ URETERAL STENT PLACEMENT Left 01/16/2019   Procedure: CYSTOSCOPY WITH RETROGRADE PYELOGRAM/URETERAL LEFT STENT PLACEMENT;  Surgeon: Ardis Hughs, MD;  Location: [facility];  Service: Urology;  Laterality: Left;  . CYSTOSCOPY/URETEROSCOPY/HOLMIUM LASER/STENT PLACEMENT Left 01/24/2019   Procedure: CYSTOSCOPY, LEFT URETEROSCOPY, HOLMIUM LASER, STONE REMOVAL AND STENT EXCHANGE;  Surgeon: Ardis Hughs, MD;  Location: [facility];  Service: Urology;  Laterality: Left;  . LITHOTRIPSY  X 3  . RIGHT/LEFT HEART CATH AND CORONARY/GRAFT ANGIOGRAPHY N/A 09/08/2018   Procedure: RIGHT/LEFT HEART CATH AND CORONARY/GRAFT ANGIOGRAPHY;  Surgeon: Martinique, Peter M, MD;  Location: [facility];  Service: Cardiovascular;  Laterality: N/A;  . TOTAL ABDOMINAL HYSTERECTOMY     ovaries took before hysterectomy  . TUBAL LIGATION    . URETERAL STENT PLACEMENT      FAMILY HISTORY: Family History  Problem Relation Age of Onset  . Cancer Father   . Diabetes Father   . Diabetes Brother   . Stroke Brother   . Diabetes Brother   . Heart disease Brother        CAD with  CABG  . Heart disease Sister        CAD with MI  . Heart attack Sister     SOCIAL HISTORY: Social History   Socioeconomic History  . Marital status: Divorced    Spouse name: Not on file  . Number of children: 2  . Years of education: 74  . Highest education level: Not on file  Occupational History  . Occupation: retired  Scientific laboratory technician  . Financial resource strain: Not on file  . Food insecurity    Worry: Not on file    Inability: Not on file  . Transportation needs    Medical: Not on file    Non-medical: Not on file  Tobacco Use  . Smoking status: Current Some Day Smoker    Packs/day: 0.33    Years: 35.00    Pack years: 11.55    Types: Cigarettes    Last attempt to quit: 06/11/2016    Years since quitting: 2.8  . Smokeless tobacco: Former Systems developer    Quit date: 11/20/2012  Substance and Sexual Activity  . Alcohol use: Yes    Comment: 06/11/2016 "might have a few drinks/month; usually wine"  . Drug use: No  . Sexual activity: Not Currently  Lifestyle  . Physical activity    Days per week: Not on file    Minutes per session: Not on file  . Stress: Not on file  Relationships  . Social Herbalist on phone: Not on file    Gets together: Not on file    Attends religious service: Not on file    Active member of club or organization: Not on file    Attends meetings of clubs or organizations: Not on file    Relationship status: Not on file  . Intimate partner violence    Fear of current or ex partner: Not on file    Emotionally abused: Not on file    Physically abused: Not on file    Forced sexual activity: Not on file  Other Topics Concern  . Not on file  Social History Narrative   Patient drinks about 1 cup of coffee daily.   Patient  is right handed.     PHYSICAL EXAM  There were no vitals filed for this visit. There is no height or weight on file to calculate BMI.  Generalized: Well developed, in no acute distress  MMSE - Mini Mental State Exam  04/03/2019  Orientation to time 5  Orientation to Place 5  Registration 3  Attention/ Calculation 5  Recall 3  Language- name 2 objects 2  Language- repeat 1  Language- follow 3 step command 3  Language- read & follow direction 1  Write a sentence 1  Copy design 1  Copy design-comments named 5 animals  Total score 30    Neurological examination  Mentation: Alert oriented to time, place, history taking. Follows all commands speech and language fluent Cranial nerve II-XII: Pupils were equal round reactive to light. Extraocular movements were full, visual field were full on confrontational test. Facial sensation and strength were normal. Head turning and shoulder shrug  were normal and symmetric. Motor: The motor testing reveals 5 over 5 strength of all 4 extremities. Good symmetric motor tone is noted throughout.  Sensory: Sensory testing is intact to soft touch on all 4 extremities. No evidence of extinction is noted.  Coordination: Cerebellar testing reveals good finger-nose-finger and heel-to-shin bilaterally.  Gait and station: Gait is normal. Tandem gait is normal. Romberg is negative. No drift is seen.  Reflexes: Deep tendon reflexes are symmetric and normal bilaterally.   DIAGNOSTIC DATA (LABS, IMAGING, TESTING) - I reviewed patient records, labs, notes, testing and imaging myself where available.  Lab Results  Component Value Date   WBC 7.0 01/22/2019   HGB 12.1 01/22/2019   HCT 39.8 01/22/2019   MCV 98.5 01/22/2019   PLT 183 01/22/2019      Component Value Date/Time   NA 139 01/22/2019 1005   NA 143 09/04/2018 1306   K 4.4 01/22/2019 1005   CL 112 (H) 01/22/2019 1005   CO2 21 (L) 01/22/2019 1005   GLUCOSE 124 (H) 01/22/2019 1005   BUN 12 01/22/2019 1005   BUN 26 09/04/2018 1306   CREATININE 0.85 01/22/2019 1005   CALCIUM 9.2 01/22/2019 1005   PROT 7.4 01/19/2018 0000   PROT 7.4 09/15/2015 1026   ALBUMIN 3.9 06/10/2016 1848   ALBUMIN 4.4 09/15/2015 1026   AST  20 01/19/2018 0000   ALT 15 03/12/2019 1043   ALKPHOS 124 06/10/2016 1848   BILITOT 0.5 01/19/2018 0000   BILITOT 0.3 09/15/2015 1026   GFRNONAA >60 01/22/2019 1005   GFRAA >60 01/22/2019 1005   Lab Results  Component Value Date   CHOL 136 03/12/2019   HDL 46 03/12/2019   LDLCALC 79 03/12/2019   LDLDIRECT 67 06/13/2018   TRIG 54 03/12/2019   CHOLHDL 3.0 03/12/2019   Lab Results  Component Value Date   HGBA1C 7.4 (H) 01/22/2019   Lab Results  Component Value Date   ZOXWRUEA54 098 03/22/2013   Lab Results  Component Value Date   TSH 0.959 08/14/2018    ASSESSMENT AND PLAN 63 y.o. year old female  has a past medical history of Carotid artery stenosis, Coronary artery disease, Dyslipidemia, Family history of adverse reaction to anesthesia, Generalized convulsive epilepsy without mention of intractable epilepsy, GERD (gastroesophageal reflux disease), History of kidney stones, Hypertension, Memory deficit, Migraine, Nephrolithiasis (07/30/2015), OSA (obstructive sleep apnea), PONV (postoperative nausea and vomiting), Seizures ([place]), Type II diabetes mellitus ([place]), and Vasovagal syncope (07/30/2015). here with:  1. Seizures, well controlled 2. Recent kidney stone  3. Reported  memory disturbance  Her seizures are well controlled. She has not had recurrent seizure.  For now she will continue Keppra and Topamax for seizure prevention. She will reach out to her urologist to see if there is any need to adjust her Topamax. She does report she has had kidney stones all her life, did not worsen along the time of starting Topamax in 2017. She is doing a urine test at this time for urology where she is collecting urine for several days.  She is not sure who her urologist is.  Her memory score is stable, was 30/30 today.  She will follow-up in 1 year or sooner if needed.  I advised that if her symptoms worsen or develops any new symptoms she should let us know. I have filled her Keppra today, she  reports does need refill on Topamax.   I spent 25 minutes with the patient. 50% of this time was spent discussing her plan of care.    Butler Denmark, AGNP-C, DNP 04/03/2019, 9:05 AM [facility] [address] [place], [place] [address] [phone]

## 2019-04-03 ENCOUNTER — Other Ambulatory Visit: Payer: Self-pay | Admitting: Cardiology

## 2019-04-03 ENCOUNTER — Ambulatory Visit (INDEPENDENT_AMBULATORY_CARE_PROVIDER_SITE_OTHER): Payer: Medicare HMO | Admitting: Neurology

## 2019-04-03 ENCOUNTER — Other Ambulatory Visit: Payer: Self-pay

## 2019-04-03 ENCOUNTER — Encounter: Payer: Self-pay | Admitting: Neurology

## 2019-04-03 VITALS — BP 149/83 | HR 73 | Temp 96.6°F | Ht 61.0 in | Wt 146.0 lb

## 2019-04-03 DIAGNOSIS — R413 Other amnesia: Secondary | ICD-10-CM

## 2019-04-03 DIAGNOSIS — G40309 Generalized idiopathic epilepsy and epileptic syndromes, not intractable, without status epilepticus: Secondary | ICD-10-CM

## 2019-04-03 MED ORDER — LEVETIRACETAM 500 MG PO TABS: 500.0000 mg | ORAL_TABLET | Freq: Two times a day (BID) | ORAL | 3 refills | Status: DC

## 2019-04-03 NOTE — Patient Instructions (Signed)
Please continue Keppra and Topamax. Please discuss with your urologist regarding your history of kidney stones while taking Topamax. I know you have been on Topamax since 2017. We will see you in a 1 year.

## 2019-04-03 NOTE — Progress Notes (Signed)
I have read the note, and I agree with the clinical assessment and plan.  Mckinleigh Schuchart K Kerith Sherley   

## 2019-04-05 DIAGNOSIS — G4733 Obstructive sleep apnea (adult) (pediatric): Secondary | ICD-10-CM | POA: Diagnosis not present

## 2019-04-09 ENCOUNTER — Other Ambulatory Visit: Payer: Self-pay | Admitting: Physician Assistant

## 2019-04-09 DIAGNOSIS — I10 Essential (primary) hypertension: Secondary | ICD-10-CM

## 2019-04-10 DIAGNOSIS — N200 Calculus of kidney: Secondary | ICD-10-CM | POA: Diagnosis not present

## 2019-04-13 ENCOUNTER — Telehealth: Payer: Self-pay | Admitting: Pharmacist

## 2019-04-13 ENCOUNTER — Other Ambulatory Visit: Payer: Self-pay

## 2019-04-13 ENCOUNTER — Ambulatory Visit (INDEPENDENT_AMBULATORY_CARE_PROVIDER_SITE_OTHER): Payer: Medicare HMO | Admitting: Pharmacist

## 2019-04-13 ENCOUNTER — Encounter: Payer: Self-pay | Admitting: Pharmacist

## 2019-04-13 DIAGNOSIS — E785 Hyperlipidemia, unspecified: Secondary | ICD-10-CM | POA: Diagnosis not present

## 2019-04-13 MED ORDER — PRALUENT 75 MG/ML ~~LOC~~ SOAJ: 1.0000 "pen " | SUBCUTANEOUS | 11 refills | Status: DC

## 2019-04-13 NOTE — Telephone Encounter (Signed)
[employer]uent approved through 04/12/20. Copay is $30. Per patient, with her other medications, this is too much. Will apply for grant though [employer]. Patient was unsure of her annual income and needed to look it up. Advised patient we will call her Monday for that information.

## 2019-04-13 NOTE — Patient Instructions (Signed)
It was a pleasure to meet you today.  We will submit a prior authorization to your insurance company for either [place] or [place]. Once approved we will call you with the cost. If it is too expensive, we can apply for a grant through the [facility]  Call us at [phone] with any questions or concerns

## 2019-04-13 NOTE — Progress Notes (Signed)
Patient ID: Samantha Huffman                 DOB: [birth year]                    MRN: [id number]     HPI: Samantha Huffman is a 63 y.o. female patient referred to lipid clinic by [facility]. PMH is significant for CAD status post CABG 10/2012 [facility], abnormal stress test 2017 but cath showed patent grafts to the OM and LAD and 30% RCA normal LV function. PMH also significant for HTN, HLD and DM type 2.   Patient was seen prior in lipid clinic, however after that visit her lipid panel resulted with an LDL <70, therefore insurance would not approved PCSK9 inhibitors. LDL is now >70. Patient presents today to discuss PCSK9 inhibitors again.  Patient presents in good spirits. She is a little nervous about needles, but want to try the PCKS9 inhibitors. She states cost is a concern too.  Current Medications: atorvastatin 80mg  daily, zetia 10mg  daily Intolerances:  Risk Factors:CAD  LDL goal: <70  Diet: baked chicken, fish, veggies, potatoes  Exercise: walks about 10 min twice a day (due to heat and she gets tired)  Family History: The patient'sfamily history includes Cancer in her father; Diabetes in her brother, brother, and father; Heart attack in her sister; Heart disease in her brother and sister; Stroke in her brother.  Social History:   Labs: 03/12/2019 TC 136, TG 54, HDL 46, LDL 79 (atorvastatin 80mg  daily, Zetia 10mg  daily)           06/13/2018 TC 124, TG 69, HDL 48, LDL 62 (atorvastatin 80mg  daily, Zetia 10mg  daily)            01/19/18: TC 174, HDL 49, TG 127, LDL 103  (atorvastatin 80mg  daily, Zetia 10mg  daily)   Past Medical History:  Diagnosis Date  . Carotid artery stenosis    1-39% bilateral stenosis by dopplers 12/2017  . Coronary artery disease    a. s/p CABG 10/2012.  cath 05/2016 with moderate LM stenosis, mild LCx and RCA stenosis and occluded LAD with patent LIMA to LAD, free radial to OM.  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  Marland Kitchen Dyslipidemia   . Family history  of adverse reaction to anesthesia    mother and sister ponv  . Generalized convulsive epilepsy without mention of intractable epilepsy   . GERD (gastroesophageal reflux disease)   . History of kidney stones   . Hypertension   . Memory deficit    slight  . Migraine    "controlled on daily RX " (06/11/2016)  . Nephrolithiasis 07/30/2015   S/p lithotripsy x 3 and right and left ureter stents  . OSA (obstructive sleep apnea)    cpap   . PONV (postoperative nausea and vomiting)   . Seizures ([place])    "controlled w/daily RX; started in 2012; dr thinks they might be from the migraines; last sz was early part of 2016" (06/11/2016)  . Type II diabetes mellitus (HCC)    metformin  . Vasovagal syncope 07/30/2015   2011    Current Outpatient Medications on File Prior to Visit  Medication Sig Dispense Refill  . acetaminophen (TYLENOL) 325 MG tablet Take 2 tablets (650 mg total) by mouth every 4 (four) hours as needed for headache or mild pain.    Marland Kitchen ALPRAZolam (XANAX) 0.25 MG tablet Take 0.25 mg by mouth 3 (three) times daily as needed for anxiety.  0  . aspirin EC 81 MG tablet Take 81 mg by mouth daily.    Marland Kitchen atorvastatin (LIPITOR) 80 MG tablet Take 1 tablet (80 mg total) by mouth daily. 90 tablet 3  . buPROPion (WELLBUTRIN SR) 150 MG 12 hr tablet TAKE 1 TABLET BY MOUTH DAILY FOR 3 DAYS, THEN INCREASE TO 1 TABLET TWICE A DAY 180 tablet 3  . carvedilol (COREG) 12.5 MG tablet Take 1 tablet (12.5 mg total) by mouth 2 (two) times daily. 180 tablet 3  . clopidogrel (PLAVIX) 75 MG tablet Take 1 tablet (75 mg total) by mouth daily. Resume once stent has been removed.    . clotrimazole-betamethasone (LOTRISONE) cream Apply 1 application topically daily as needed (skin folds (under breasts)).     Marland Kitchen escitalopram (LEXAPRO) 10 MG tablet Take 10 mg by mouth at bedtime.     Marland Kitchen ezetimibe (ZETIA) 10 MG tablet TAKE 1 TABLET (10 MG TOTAL) BY MOUTH DAILY. (Patient taking differently: Take 10 mg by mouth at bedtime. )  90 tablet 2  . furosemide (LASIX) 20 MG tablet Take 20 mg by mouth daily as needed (fluid retention/swelling.).    Marland Kitchen isosorbide mononitrate (IMDUR) 120 MG 24 hr tablet Take 1 tablet (120 mg total) by mouth daily. 90 tablet 3  . levETIRAcetam (KEPPRA) 500 MG tablet Take 1 tablet (500 mg total) by mouth 2 (two) times daily. 180 tablet 3  . lisinopril (ZESTRIL) 20 MG tablet TAKE 1 TABLET BY MOUTH EVERY DAY 90 tablet 1  . metFORMIN (GLUCOPHAGE-XR) 500 MG 24 hr tablet Take 500 mg by mouth daily with breakfast.   12  . nitroGLYCERIN (NITROSTAT) 0.4 MG SL tablet Place 1 tablet (0.4 mg total) under the tongue every 5 (five) minutes x 3 doses as needed for chest pain. 25 tablet 4  . pantoprazole (PROTONIX) 40 MG tablet TAKE 1 TABLET BY MOUTH EVERY DAY (Patient taking differently: Take 40 mg by mouth daily. ) 90 tablet 2  . topiramate (TOPAMAX) 25 MG tablet TAKE 3 TABLETS (75 MG TOTAL) BY MOUTH 2 (TWO) TIMES DAILY. 540 tablet 1   No current facility-administered medications on file prior to visit.     Allergies  Allergen Reactions  . Depakote [Divalproex Sodium] Other (See Comments)    Hyperammonemia  . Penicillins Hives    Other reaction(s): GI intolerance Has patient had a PCN reaction causing immediate rash, facial/tongue/throat swelling, SOB or lightheadedness with hypotension: YES Has patient had a PCN reaction causing severe rash involving mucus membranes or skin necrosis: NO Has patient had a PCN reaction that required hospitalizationNO Has patient had a PCN reaction occurring within the last 10 years: NO If all of the above answers are "NO", then may proceed with Cephalosporin use.    Assessment/Plan:  1. Hyperlipidemia - LDL is above goal of <70. Patient already on atorvastatin and zetia. Will submit PA for PCSK9 inhibitor. Patient does have a medicare plan, therefore cannot use copay cards. If too expensive, will submit for [employer] grant. Patient in agreement with plan.  Reviewed injection technique with patient.   Thank you,  Ramond Dial, Pharm.D, [place]  [id number], [place], [place] [address]  Phone: [phone]; Fax: [phone]

## 2019-04-16 NOTE — Telephone Encounter (Signed)
[employer] application submitted and approved for $2500 grant through 03/15/20. Info provided to pharmacy and patient's daughter is aware.

## 2019-04-17 DIAGNOSIS — G43909 Migraine, unspecified, not intractable, without status migrainosus: Secondary | ICD-10-CM | POA: Diagnosis not present

## 2019-04-17 DIAGNOSIS — I10 Essential (primary) hypertension: Secondary | ICD-10-CM | POA: Diagnosis not present

## 2019-04-17 DIAGNOSIS — I25119 Atherosclerotic heart disease of native coronary artery with unspecified angina pectoris: Secondary | ICD-10-CM | POA: Diagnosis not present

## 2019-04-17 DIAGNOSIS — K219 Gastro-esophageal reflux disease without esophagitis: Secondary | ICD-10-CM | POA: Diagnosis not present

## 2019-04-17 DIAGNOSIS — E663 Overweight: Secondary | ICD-10-CM | POA: Diagnosis not present

## 2019-04-17 DIAGNOSIS — E119 Type 2 diabetes mellitus without complications: Secondary | ICD-10-CM | POA: Diagnosis not present

## 2019-04-17 DIAGNOSIS — E785 Hyperlipidemia, unspecified: Secondary | ICD-10-CM | POA: Diagnosis not present

## 2019-04-17 DIAGNOSIS — R69 Illness, unspecified: Secondary | ICD-10-CM | POA: Diagnosis not present

## 2019-04-17 DIAGNOSIS — G40909 Epilepsy, unspecified, not intractable, without status epilepticus: Secondary | ICD-10-CM | POA: Diagnosis not present

## 2019-05-06 DIAGNOSIS — G4733 Obstructive sleep apnea (adult) (pediatric): Secondary | ICD-10-CM | POA: Diagnosis not present

## 2019-05-11 DIAGNOSIS — R82998 Other abnormal findings in urine: Secondary | ICD-10-CM | POA: Diagnosis not present

## 2019-05-14 ENCOUNTER — Telehealth: Payer: Self-pay

## 2019-05-14 ENCOUNTER — Other Ambulatory Visit: Payer: Self-pay

## 2019-05-14 DIAGNOSIS — E785 Hyperlipidemia, unspecified: Secondary | ICD-10-CM

## 2019-05-14 NOTE — Telephone Encounter (Signed)
Called and spoke w/pt regarding needing labs... mailed the lab orders to them for lipid and hepatic panel

## 2019-06-05 DIAGNOSIS — G4733 Obstructive sleep apnea (adult) (pediatric): Secondary | ICD-10-CM | POA: Diagnosis not present

## 2019-06-18 ENCOUNTER — Other Ambulatory Visit: Payer: Self-pay | Admitting: Cardiology

## 2019-06-18 DIAGNOSIS — E119 Type 2 diabetes mellitus without complications: Secondary | ICD-10-CM | POA: Diagnosis not present

## 2019-06-18 DIAGNOSIS — Z23 Encounter for immunization: Secondary | ICD-10-CM | POA: Diagnosis not present

## 2019-06-18 DIAGNOSIS — E785 Hyperlipidemia, unspecified: Secondary | ICD-10-CM | POA: Diagnosis not present

## 2019-06-18 DIAGNOSIS — I10 Essential (primary) hypertension: Secondary | ICD-10-CM | POA: Diagnosis not present

## 2019-06-18 DIAGNOSIS — Z0001 Encounter for general adult medical examination with abnormal findings: Secondary | ICD-10-CM | POA: Diagnosis not present

## 2019-06-18 DIAGNOSIS — I2510 Atherosclerotic heart disease of native coronary artery without angina pectoris: Secondary | ICD-10-CM | POA: Diagnosis not present

## 2019-06-18 DIAGNOSIS — H109 Unspecified conjunctivitis: Secondary | ICD-10-CM | POA: Diagnosis not present

## 2019-06-18 DIAGNOSIS — E7800 Pure hypercholesterolemia, unspecified: Secondary | ICD-10-CM | POA: Diagnosis not present

## 2019-06-18 DIAGNOSIS — R69 Illness, unspecified: Secondary | ICD-10-CM | POA: Diagnosis not present

## 2019-06-18 DIAGNOSIS — M25511 Pain in right shoulder: Secondary | ICD-10-CM | POA: Diagnosis not present

## 2019-06-19 LAB — HEPATIC FUNCTION PANEL
AG Ratio: 1.7 (calc) (ref 1.0–2.5)
ALT: 11 U/L (ref 6–29)
AST: 15 U/L (ref 10–35)
Albumin: 4.3 g/dL (ref 3.6–5.1)
Alkaline phosphatase (APISO): 112 U/L (ref 37–153)
Bilirubin, Direct: 0.1 mg/dL (ref 0.0–0.2)
Globulin: 2.5 g/dL (calc) (ref 1.9–3.7)
Indirect Bilirubin: 0.3 mg/dL (calc) (ref 0.2–1.2)
Total Bilirubin: 0.4 mg/dL (ref 0.2–1.2)
Total Protein: 6.8 g/dL (ref 6.1–8.1)

## 2019-06-19 LAB — LIPID PANEL
Cholesterol: 101 mg/dL (ref <200)
HDL: 50 mg/dL (ref >=50)
LDL Cholesterol (Calc): 35 mg/dL (calc)
Non-HDL Cholesterol (Calc): 51 mg/dL (calc) (ref <130)
Total CHOL/HDL Ratio: 2 (calc) (ref <5.0)
Triglycerides: 76 mg/dL (ref <150)

## 2019-06-21 ENCOUNTER — Other Ambulatory Visit: Payer: Medicare HMO

## 2019-07-15 ENCOUNTER — Other Ambulatory Visit: Payer: Self-pay | Admitting: Cardiology

## 2019-07-16 ENCOUNTER — Other Ambulatory Visit: Payer: Self-pay | Admitting: Cardiology

## 2019-07-16 ENCOUNTER — Other Ambulatory Visit: Payer: Self-pay | Admitting: Neurology

## 2019-07-16 DIAGNOSIS — I10 Essential (primary) hypertension: Secondary | ICD-10-CM

## 2019-07-16 MED ORDER — CARVEDILOL 12.5 MG PO TABS: 12.5000 mg | ORAL_TABLET | Freq: Two times a day (BID) | ORAL | 2 refills | Status: DC

## 2019-07-16 MED ORDER — ATORVASTATIN CALCIUM 80 MG PO TABS: 80.0000 mg | ORAL_TABLET | Freq: Every day | ORAL | 2 refills | Status: DC

## 2019-07-16 MED ORDER — TOPIRAMATE 25 MG PO TABS: 75.0000 mg | ORAL_TABLET | Freq: Two times a day (BID) | ORAL | 1 refills | Status: DC

## 2019-07-16 MED ORDER — CLOPIDOGREL BISULFATE 75 MG PO TABS: 75.0000 mg | ORAL_TABLET | Freq: Every day | ORAL | 2 refills | Status: DC

## 2019-07-16 MED ORDER — LISINOPRIL 20 MG PO TABS: 20.0000 mg | ORAL_TABLET | Freq: Every day | ORAL | 2 refills | Status: DC

## 2019-07-16 MED ORDER — ISOSORBIDE MONONITRATE ER 120 MG PO TB24: 120.0000 mg | ORAL_TABLET | Freq: Every day | ORAL | 2 refills | Status: DC

## 2019-07-16 NOTE — Telephone Encounter (Signed)
Ok to refill Carvedilol and Zestril

## 2019-07-16 NOTE — Addendum Note (Signed)
Addended by: Derl Barrow on: 07/16/2019 02:42 PM   Modules accepted: Orders

## 2019-07-16 NOTE — Telephone Encounter (Signed)
Pt has called for a refill on the expired scripts for topiramate (TOPAMAX) 25 MG tablet, carvedilol (COREG) 12.5 MG tablet & lisinopril (ZESTRIL) 20 MG tablet [id number] -

## 2019-07-16 NOTE — Telephone Encounter (Signed)
Pt's medications were sent to pt's pharmacy as requested. Confirmation received.  

## 2019-07-16 NOTE — Addendum Note (Signed)
Addended by: Oliver Hum S on: 07/16/2019 01:30 PM   Modules accepted: Orders

## 2019-07-16 NOTE — Telephone Encounter (Signed)
I filled topamax, sent message to Dr. Fransico Him.

## 2019-07-18 DIAGNOSIS — G4733 Obstructive sleep apnea (adult) (pediatric): Secondary | ICD-10-CM | POA: Diagnosis not present

## 2019-07-24 ENCOUNTER — Other Ambulatory Visit: Payer: Self-pay | Admitting: Family Medicine

## 2019-07-24 DIAGNOSIS — Z139 Encounter for screening, unspecified: Secondary | ICD-10-CM

## 2019-08-17 DIAGNOSIS — G4733 Obstructive sleep apnea (adult) (pediatric): Secondary | ICD-10-CM | POA: Diagnosis not present

## 2019-08-20 ENCOUNTER — Telehealth: Payer: Self-pay | Admitting: *Deleted

## 2019-08-20 NOTE — Telephone Encounter (Signed)

## 2019-09-05 NOTE — Progress Notes (Addendum)
Virtual Visit via Telephone Note   This visit type was conducted due to national recommendations for restrictions regarding the COVID-19 Pandemic (e.g. social distancing) in an effort to limit this patient's exposure and mitigate transmission in our community.  Due to her co-morbid illnesses, this patient is at least at moderate risk for complications without adequate follow up.  This format is felt to be most appropriate for this patient at this time.  All issues noted in this document were discussed and addressed.  A limited physical exam was performed with this format.  Please refer to the patient's chart for her consent to telehealth for [facility].   Evaluation Performed:  Follow-up visit  This visit type was conducted due to national recommendations for restrictions regarding the COVID-19 Pandemic (e.g. social distancing).  This format is felt to be most appropriate for this patient at this time.  All issues noted in this document were discussed and addressed.  No physical exam was performed (except for noted visual exam findings with Video Visits).  Please refer to the patient's chart (MyChart message for video visits and phone note for telephone visits) for the patient's consent to telehealth for [facility].  Date:  09/06/2019   ID:  Samantha Huffman, DOB [birth year], MRN [id number]  Patient Location:  Home  Provider location:   [facility]  PCP:  Alroy Dust, Carlean Jews.Marlou Sa, MD  Cardiologist:  Fransico Him, MD  Electrophysiologist:  None   Chief Complaint:  CAD, Carotid stenosis, HLD, HTN  History of Present Illness:    Samantha Huffman is a 64 y.o. female who presents via audio/video conferencing for a telehealth visit today.    Samantha Huffman is a 64 y.o. female with a history of CAD status post CABG 10/2012 in [place], abnormal stress test 2017 but cath showed patent grafts to the OM and LAD and 30% RCA normal LV function. Patient has had periodic chest pain and was placed on Imdur  for possible microvascular angina. PCP was treating her for anxiety. 2D echo 07/2018 EF 60 to 65% with grade 1 DD.   Patient underwent cardiac catheterization 09/04/2018 due to ongoing CP and found to have patent LIMA to the LAD, patent free2sHe radial graft to the OM, normal LV function, low LV filling pressures. Chlorthalidone and Lasix were held because of low filling pressures and blood pressure otherwise medical therapy continued. She also has HLD, HTN, DM2, OSA on CPAP and carotid artery dz as well as tobacco abuse.  She is here today for followup and is doing well.  She has chronic DOE from smoking that is stable and unchanged from when I saw her last. She denies any chest pain or pressure,  PND, orthopnea, LE edema, dizziness, palpitations or syncope. She is compliant with her meds and is tolerating meds with no SE.    She is doing well with her CPAP device.  She tolerates the mask and feels the pressure is adequate.  Since going on CPAP she feels rested in the am and has no significant daytime sleepiness.  She denies any significant mouth or nasal dryness or nasal congestion.    The patient does not have symptoms concerning for COVID-19 infection (fever, chills, cough, or new shortness of breath).    Prior CV studies:   The following studies were reviewed today:  Cardiac cath, 2D echo  Past Medical History:  Diagnosis Date  . Carotid artery stenosis    1-39% bilateral stenosis by dopplers 12/2017  . Coronary artery disease  a. s/p CABG 10/2012.  cath 05/2016 with moderate LM stenosis, mild LCx and RCA stenosis and occluded LAD with patent LIMA to LAD, free radial to OM.  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  Marland Kitchen Dyslipidemia   . Family history of adverse reaction to anesthesia    mother and sister ponv  . Generalized convulsive epilepsy without mention of intractable epilepsy   . GERD (gastroesophageal reflux disease)   . History of kidney stones   . Hypertension   . Memory  deficit    slight  . Migraine    "controlled on daily RX " (06/11/2016)  . Nephrolithiasis 07/30/2015   S/p lithotripsy x 3 and right and left ureter stents  . OSA (obstructive sleep apnea)    cpap   . PONV (postoperative nausea and vomiting)   . Seizures ([place])    "controlled w/daily RX; started in 2012; dr thinks they might be from the migraines; last sz was early part of 2016" (06/11/2016)  . Type II diabetes mellitus (HCC)    metformin  . Vasovagal syncope 07/30/2015   2011   Past Surgical History:  Procedure Laterality Date  . CARDIAC CATHETERIZATION  2014; 06/11/2016  . CARDIAC CATHETERIZATION N/A 06/11/2016   Procedure: Left Heart Cath and Cors/Grafts Angiography;  Surgeon: Sherren Mocha, MD;  Location: [place] [facility];  Service: Cardiovascular;  Laterality: N/A;  . CESAREAN SECTION    . CORONARY ANGIOPLASTY    . CORONARY ARTERY BYPASS GRAFT  March, 2014   LIMA to LAD, left radial to LCx x 2  . CYSTOSCOPY W/ URETERAL STENT PLACEMENT Left 01/16/2019   Procedure: CYSTOSCOPY WITH RETROGRADE PYELOGRAM/URETERAL LEFT STENT PLACEMENT;  Surgeon: Ardis Hughs, MD;  Location: [facility];  Service: Urology;  Laterality: Left;  . CYSTOSCOPY/URETEROSCOPY/HOLMIUM LASER/STENT PLACEMENT Left 01/24/2019   Procedure: CYSTOSCOPY, LEFT URETEROSCOPY, HOLMIUM LASER, STONE REMOVAL AND STENT EXCHANGE;  Surgeon: Ardis Hughs, MD;  Location: [facility];  Service: Urology;  Laterality: Left;  . LITHOTRIPSY  X 3  . RIGHT/LEFT HEART CATH AND CORONARY/GRAFT ANGIOGRAPHY N/A 09/08/2018   Procedure: RIGHT/LEFT HEART CATH AND CORONARY/GRAFT ANGIOGRAPHY;  Surgeon: Martinique, Peter M, MD;  Location: [employer] [facility];  Service: Cardiovascular;  Laterality: N/A;  . TOTAL ABDOMINAL HYSTERECTOMY     ovaries took before hysterectomy  . TUBAL LIGATION    . URETERAL STENT PLACEMENT       Current Meds  Medication Sig  . acetaminophen (TYLENOL) 325 MG tablet Take 2 tablets (650 mg total) by mouth every 4 (four)  hours as needed for headache or mild pain.  . Alirocumab (PRALUENT) 75 MG/ML SOAJ Inject 1 pen into the skin every 14 (fourteen) days.  . ALPRAZolam (XANAX) 0.25 MG tablet Take 0.25 mg by mouth 3 (three) times daily as needed for anxiety.   Marland Kitchen aspirin EC 81 MG tablet Take 81 mg by mouth daily.  Marland Kitchen atorvastatin (LIPITOR) 80 MG tablet Take 1 tablet (80 mg total) by mouth daily.  . carvedilol (COREG) 12.5 MG tablet Take 1 tablet (12.5 mg total) by mouth 2 (two) times daily.  . clopidogrel (PLAVIX) 75 MG tablet Take 1 tablet (75 mg total) by mouth daily.  . clotrimazole-betamethasone (LOTRISONE) cream Apply 1 application topically daily as needed (skin folds (under breasts)).   Marland Kitchen escitalopram (LEXAPRO) 10 MG tablet Take 10 mg by mouth at bedtime.   Marland Kitchen ezetimibe (ZETIA) 10 MG tablet TAKE 1 TABLET (10 MG TOTAL) BY MOUTH DAILY. (Patient taking differently: Take 10  mg by mouth at bedtime. )  . furosemide (LASIX) 20 MG tablet Take 20 mg by mouth daily as needed (fluid retention/swelling.).  Marland Kitchen isosorbide mononitrate (IMDUR) 120 MG 24 hr tablet Take 1 tablet (120 mg total) by mouth daily.  Marland Kitchen levETIRAcetam (KEPPRA) 500 MG tablet Take 1 tablet (500 mg total) by mouth 2 (two) times daily.  Marland Kitchen lisinopril (ZESTRIL) 20 MG tablet Take 1 tablet (20 mg total) by mouth daily.  . metFORMIN (GLUCOPHAGE-XR) 500 MG 24 hr tablet Take 500 mg by mouth daily with breakfast.   . nitroGLYCERIN (NITROSTAT) 0.4 MG SL tablet Place 1 tablet (0.4 mg total) under the tongue every 5 (five) minutes x 3 doses as needed for chest pain.  . pantoprazole (PROTONIX) 40 MG tablet TAKE 1 TABLET BY MOUTH EVERY DAY (Patient taking differently: Take 40 mg by mouth daily. )  . topiramate (TOPAMAX) 25 MG tablet Take 3 tablets (75 mg total) by mouth 2 (two) times daily.     Allergies:   Depakote [divalproex sodium] and Penicillins   Social History   Tobacco Use  . Smoking status: Current Some Day Smoker    Packs/day: 0.33    Years: 35.00     Pack years: 11.55    Types: Cigarettes    Last attempt to quit: 06/11/2016    Years since quitting: 3.2  . Smokeless tobacco: Former Systems developer    Quit date: 11/20/2012  Substance Use Topics  . Alcohol use: Yes    Comment: 06/11/2016 "might have a few drinks/month; usually wine"  . Drug use: No     Family Hx: The patient's family history includes Cancer in her father; Diabetes in her brother, brother, and father; Heart attack in her sister; Heart disease in her brother and sister; Stroke in her brother.  ROS:   Please see the history of present illness.     All other systems reviewed and are negative.   Labs/Other Tests and Data Reviewed:    Recent Labs: 01/22/2019: BUN 12; Creatinine, Ser 0.85; Hemoglobin 12.1; Platelets 183; Potassium 4.4; Sodium 139 06/18/2019: ALT 11   Recent Lipid Panel Lab Results  Component Value Date/Time   CHOL 101 06/18/2019 11:19 AM   CHOL 136 03/12/2019 10:43 AM   TRIG 76 06/18/2019 11:19 AM   HDL 50 06/18/2019 11:19 AM   HDL 46 03/12/2019 10:43 AM   CHOLHDL 2.0 06/18/2019 11:19 AM   LDLCALC 35 06/18/2019 11:19 AM   LDLDIRECT 67 06/13/2018 03:17 PM    Wt Readings from Last 3 Encounters:  09/06/19 145 lb (65.8 kg)  04/03/19 146 lb (66.2 kg)  03/12/19 149 lb 3.2 oz (67.7 kg)     Objective:    Vital Signs:  Ht 5\' 1"  (1.549 m)   Wt 145 lb (65.8 kg)   BMI 27.40 kg/m     ASSESSMENT & PLAN:    1. ASCAD  -CABG x2 in 2014 with LIMA to the LAD and free radial to the OM with recent cath 09/04/2018 with patent LIMA to the LAD and patent free radial graft to the OM.  -she has not had any further anginal sx -continue ASA, high intensity statin, BB, Plavix and long acting nitrate.   2. HTN  -Continue Lisinopril 20mg  daily, Imdur 120mg  daily, Carvedilol 12.5mg  BID -Creatinine 0.4 and K+ 4.2 in July 2020  3. Hyperlipidemia  -LDL goal is < 70.  -LDL 35 in Oct 2020 -continue Atorvastatin 80mg  daily, Praluent and Zetia 10mg  daily  4. Carotid  artery stenosis  -dopplers 12/2017 showed 1-39% bilateral stenosis  -continue ASA and statin.   5. Type 2 DM  -this is followed by her PCP  -HbA1C was 7.4 in June  -continue Metformin XR 500mg  daily   6. Tobacco abuse -she successfully quit and I congratulated her  7.  OSA -  The PAP download was reviewed today and showed an AHI of 1.9/hr on auto CPAP  cm H2O with 33% compliance in using more than 4 hours nightly.  The patient has been using and benefiting from PAP use and will continue to benefit from therapy.  -I encouraged her to use her device nightly  8.  SOB -this is chronic but she gets SOB even with ADLs -cath a year ago showed patent grafts and SOB has not changed since then -LVF is normal -suspect this is related to her hx of tobacco use - she quit recently -I will check PFTs with [facility]  COVID-19 Education: The signs and symptoms of COVID-19 were discussed with the patient and how to seek care for testing (follow up with PCP or arrange E-visit).  The importance of social distancing was discussed today.  Patient Risk:   After full review of this patient's clinical status, I feel that they are at least moderate risk at this time.  Time:   Today, I have spent 20 minutes directly with the patient on telemedicine discussing medical problems including CAD, HTN, HLD, DM.  We also reviewed the symptoms of COVID 19 and the ways to protect against contracting the virus with telehealth technology.  I spent an additional 5 minutes reviewing patient's chart including labs.  Medication Adjustments/Labs and Tests Ordered: Current medicines are reviewed at length with the patient today.  Concerns regarding medicines are outlined above.  Tests Ordered: No orders of the defined types were placed in this encounter.  Medication Changes: No orders of the defined types were placed in this encounter.   Disposition:  Follow up in 1 year(s)  Signed, Fransico Him, MD  09/06/2019 9:11 AM     [place]

## 2019-09-06 ENCOUNTER — Encounter: Payer: Self-pay | Admitting: Cardiology

## 2019-09-06 ENCOUNTER — Telehealth (INDEPENDENT_AMBULATORY_CARE_PROVIDER_SITE_OTHER): Payer: Medicare HMO | Admitting: Cardiology

## 2019-09-06 ENCOUNTER — Other Ambulatory Visit: Payer: Self-pay

## 2019-09-06 VITALS — Ht 61.0 in | Wt 145.0 lb

## 2019-09-06 DIAGNOSIS — I6523 Occlusion and stenosis of bilateral carotid arteries: Secondary | ICD-10-CM

## 2019-09-06 DIAGNOSIS — I2510 Atherosclerotic heart disease of native coronary artery without angina pectoris: Secondary | ICD-10-CM

## 2019-09-06 DIAGNOSIS — R0602 Shortness of breath: Secondary | ICD-10-CM

## 2019-09-06 DIAGNOSIS — I10 Essential (primary) hypertension: Secondary | ICD-10-CM

## 2019-09-06 DIAGNOSIS — E119 Type 2 diabetes mellitus without complications: Secondary | ICD-10-CM | POA: Diagnosis not present

## 2019-09-06 DIAGNOSIS — G4733 Obstructive sleep apnea (adult) (pediatric): Secondary | ICD-10-CM | POA: Diagnosis not present

## 2019-09-06 DIAGNOSIS — E785 Hyperlipidemia, unspecified: Secondary | ICD-10-CM | POA: Diagnosis not present

## 2019-09-06 DIAGNOSIS — Z720 Tobacco use: Secondary | ICD-10-CM | POA: Diagnosis not present

## 2019-09-06 NOTE — Addendum Note (Signed)
Addended by: Antonieta Iba on: 09/06/2019 09:17 AM   Modules accepted: Orders

## 2019-09-06 NOTE — Patient Instructions (Addendum)
Medication Instructions:  Your physician recommends that you continue on your current medications as directed. Please refer to the Current Medication list given to you today.  *If you need a refill on your cardiac medications before your next appointment, please call your pharmacy*  Tests/Procedures Your physician has recommended that you have a pulmonary function test. Pulmonary Function Tests are a group of tests that measure how well air moves in and out of your lungs.  Follow-Up: At [facility], you and your health needs are our priority.  As part of our continuing mission to provide you with exceptional heart care, we have created designated Provider Care Teams.  These Care Teams include your primary Cardiologist (physician) and Advanced Practice Providers (APPs -  Physician Assistants and Nurse Practitioners) who all work together to provide you with the care you need, when you need it.  Your next appointment:   1 year(s)  The format for your next appointment:   In Person  Provider:   You may see Fransico Him, MD or one of the following Advanced Practice Providers on your designated Care Team:    Melina Copa, PA-C  Ermalinda Barrios, PA-C   Other Instructions COVID-19 Vaccine Information can be found at: [email or web address] For questions related to vaccine distribution or appointments, please email [email or web address] or call [phone].

## 2019-09-17 DIAGNOSIS — G4733 Obstructive sleep apnea (adult) (pediatric): Secondary | ICD-10-CM | POA: Diagnosis not present

## 2019-10-04 ENCOUNTER — Telehealth: Payer: Self-pay | Admitting: Cardiology

## 2019-10-04 NOTE — Telephone Encounter (Signed)
We are recommending the COVID-19 vaccine to all of our patients. Cardiac medications (including blood thinners) should not deter anyone from being vaccinated and there is no need to hold any of those medications prior to vaccine administration.     Currently, there is a hotline to call (active 08/31/19) to schedule vaccination appointments as no walk-ins will be accepted.   Number: [phone].    If an appointment is not available please go to [email or web address] to sign up for notification when additional vaccine appointments are available.   If you have further questions or concerns about the vaccine process, please visit [email or web address] or contact your primary care physician.   

## 2020-01-05 ENCOUNTER — Emergency Department (HOSPITAL_COMMUNITY): Payer: Medicare HMO

## 2020-01-05 ENCOUNTER — Inpatient Hospital Stay (HOSPITAL_COMMUNITY)
Admission: EM | Admit: 2020-01-05 | Discharge: 2020-01-09 | DRG: 871 | Disposition: A | Discharge status: Home / Self Care | Payer: Medicare HMO | Attending: Internal Medicine | Admitting: Internal Medicine

## 2020-01-05 ENCOUNTER — Encounter (HOSPITAL_COMMUNITY): Payer: Self-pay | Admitting: Emergency Medicine

## 2020-01-05 ENCOUNTER — Other Ambulatory Visit: Payer: Self-pay

## 2020-01-05 DIAGNOSIS — Z833 Family history of diabetes mellitus: Secondary | ICD-10-CM

## 2020-01-05 DIAGNOSIS — Z880 Allergy status to penicillin: Secondary | ICD-10-CM | POA: Diagnosis not present

## 2020-01-05 DIAGNOSIS — Z79899 Other long term (current) drug therapy: Secondary | ICD-10-CM

## 2020-01-05 DIAGNOSIS — Z7902 Long term (current) use of antithrombotics/antiplatelets: Secondary | ICD-10-CM

## 2020-01-05 DIAGNOSIS — E785 Hyperlipidemia, unspecified: Secondary | ICD-10-CM | POA: Diagnosis present

## 2020-01-05 DIAGNOSIS — F17210 Nicotine dependence, cigarettes, uncomplicated: Secondary | ICD-10-CM | POA: Diagnosis present

## 2020-01-05 DIAGNOSIS — Z87442 Personal history of urinary calculi: Secondary | ICD-10-CM

## 2020-01-05 DIAGNOSIS — J189 Pneumonia, unspecified organism: Secondary | ICD-10-CM

## 2020-01-05 DIAGNOSIS — I10 Essential (primary) hypertension: Secondary | ICD-10-CM

## 2020-01-05 DIAGNOSIS — Z23 Encounter for immunization: Secondary | ICD-10-CM

## 2020-01-05 DIAGNOSIS — Z951 Presence of aortocoronary bypass graft: Secondary | ICD-10-CM

## 2020-01-05 DIAGNOSIS — A419 Sepsis, unspecified organism: Principal | ICD-10-CM | POA: Diagnosis present

## 2020-01-05 DIAGNOSIS — G40309 Generalized idiopathic epilepsy and epileptic syndromes, not intractable, without status epilepticus: Secondary | ICD-10-CM

## 2020-01-05 DIAGNOSIS — Z8249 Family history of ischemic heart disease and other diseases of the circulatory system: Secondary | ICD-10-CM | POA: Diagnosis not present

## 2020-01-05 DIAGNOSIS — Z888 Allergy status to other drugs, medicaments and biological substances status: Secondary | ICD-10-CM

## 2020-01-05 DIAGNOSIS — N200 Calculus of kidney: Secondary | ICD-10-CM | POA: Diagnosis present

## 2020-01-05 DIAGNOSIS — R0600 Dyspnea, unspecified: Secondary | ICD-10-CM

## 2020-01-05 DIAGNOSIS — Z90710 Acquired absence of both cervix and uterus: Secondary | ICD-10-CM | POA: Diagnosis not present

## 2020-01-05 DIAGNOSIS — E119 Type 2 diabetes mellitus without complications: Secondary | ICD-10-CM | POA: Diagnosis present

## 2020-01-05 DIAGNOSIS — Z20822 Contact with and (suspected) exposure to covid-19: Secondary | ICD-10-CM | POA: Diagnosis present

## 2020-01-05 DIAGNOSIS — F320 Major depressive disorder, single episode, mild: Secondary | ICD-10-CM | POA: Diagnosis not present

## 2020-01-05 DIAGNOSIS — R569 Unspecified convulsions: Secondary | ICD-10-CM | POA: Diagnosis not present

## 2020-01-05 DIAGNOSIS — R509 Fever, unspecified: Secondary | ICD-10-CM | POA: Diagnosis not present

## 2020-01-05 DIAGNOSIS — Z7982 Long term (current) use of aspirin: Secondary | ICD-10-CM

## 2020-01-05 DIAGNOSIS — G43909 Migraine, unspecified, not intractable, without status migrainosus: Secondary | ICD-10-CM | POA: Diagnosis present

## 2020-01-05 DIAGNOSIS — I2510 Atherosclerotic heart disease of native coronary artery without angina pectoris: Secondary | ICD-10-CM | POA: Diagnosis present

## 2020-01-05 DIAGNOSIS — J181 Lobar pneumonia, unspecified organism: Secondary | ICD-10-CM | POA: Diagnosis present

## 2020-01-05 DIAGNOSIS — R0609 Other forms of dyspnea: Secondary | ICD-10-CM | POA: Diagnosis present

## 2020-01-05 DIAGNOSIS — Z7984 Long term (current) use of oral hypoglycemic drugs: Secondary | ICD-10-CM

## 2020-01-05 DIAGNOSIS — G4733 Obstructive sleep apnea (adult) (pediatric): Secondary | ICD-10-CM | POA: Diagnosis present

## 2020-01-05 DIAGNOSIS — F32A Depression, unspecified: Secondary | ICD-10-CM | POA: Diagnosis present

## 2020-01-05 DIAGNOSIS — F329 Major depressive disorder, single episode, unspecified: Secondary | ICD-10-CM | POA: Diagnosis present

## 2020-01-05 DIAGNOSIS — K219 Gastro-esophageal reflux disease without esophagitis: Secondary | ICD-10-CM | POA: Diagnosis present

## 2020-01-05 DIAGNOSIS — G40409 Other generalized epilepsy and epileptic syndromes, not intractable, without status epilepticus: Secondary | ICD-10-CM | POA: Diagnosis present

## 2020-01-05 LAB — COMPREHENSIVE METABOLIC PANEL
ALT: 22 U/L (ref 0–44)
AST: 28 U/L (ref 15–41)
Albumin: 4.4 g/dL (ref 3.5–5.0)
Alkaline Phosphatase: 88 U/L (ref 38–126)
Anion gap: 11 (ref 5–15)
BUN: 12 mg/dL (ref 8–23)
CO2: 20 mmol/L — ABNORMAL LOW (ref 22–32)
Calcium: 9.4 mg/dL (ref 8.9–10.3)
Chloride: 104 mmol/L (ref 98–111)
Creatinine, Ser: 1.02 mg/dL — ABNORMAL HIGH (ref 0.44–1.00)
GFR calc Af Amer: >60 mL/min (ref >60)
GFR calc non Af Amer: 58 mL/min — ABNORMAL LOW (ref >60)
Glucose, Bld: 131 mg/dL — ABNORMAL HIGH (ref 70–99)
Potassium: 3.9 mmol/L (ref 3.5–5.1)
Sodium: 135 mmol/L (ref 135–145)
Total Bilirubin: 0.8 mg/dL (ref 0.3–1.2)
Total Protein: 8.6 g/dL — ABNORMAL HIGH (ref 6.5–8.1)

## 2020-01-05 LAB — PROTIME-INR: Prothrombin Time: <10 seconds — ABNORMAL LOW (ref 11.4–15.2)

## 2020-01-05 LAB — CBC WITH DIFFERENTIAL/PLATELET
Abs Immature Granulocytes: 0.04 10*3/uL (ref 0.00–0.07)
Basophils Absolute: 0 10*3/uL (ref 0.0–0.1)
Basophils Relative: 0 %
Eosinophils Absolute: 0 10*3/uL (ref 0.0–0.5)
Eosinophils Relative: 0 %
HCT: 42.7 % (ref 36.0–46.0)
Hemoglobin: 14.1 g/dL (ref 12.0–15.0)
Immature Granulocytes: 0 %
Lymphocytes Relative: 21 %
Lymphs Abs: 2.1 10*3/uL (ref 0.7–4.0)
MCH: 31.9 pg (ref 26.0–34.0)
MCHC: 33 g/dL (ref 30.0–36.0)
MCV: 96.6 fL (ref 80.0–100.0)
Monocytes Absolute: 0.9 10*3/uL (ref 0.1–1.0)
Monocytes Relative: 9 %
Neutro Abs: 7.3 10*3/uL (ref 1.7–7.7)
Neutrophils Relative %: 70 %
Platelets: 175 10*3/uL (ref 150–400)
RBC: 4.42 MIL/uL (ref 3.87–5.11)
RDW: 12.4 % (ref 11.5–15.5)
WBC: 10.4 10*3/uL (ref 4.0–10.5)
nRBC: 0 % (ref 0.0–0.2)

## 2020-01-05 LAB — LACTIC ACID, PLASMA: Lactic Acid, Venous: 1.1 mmol/L (ref 0.5–1.9)

## 2020-01-05 LAB — CBG MONITORING, ED: Glucose-Capillary: 112 mg/dL — ABNORMAL HIGH (ref 70–99)

## 2020-01-05 LAB — APTT: aPTT: 30 seconds (ref 24–36)

## 2020-01-05 LAB — URINALYSIS, ROUTINE W REFLEX MICROSCOPIC
Bilirubin Urine: NEGATIVE
Glucose, UA: NEGATIVE mg/dL
Ketones, ur: NEGATIVE mg/dL
Leukocytes,Ua: NEGATIVE
Nitrite: NEGATIVE
Protein, ur: NEGATIVE mg/dL
Specific Gravity, Urine: 1.008 (ref 1.005–1.030)
pH: 5 (ref 5.0–8.0)

## 2020-01-05 IMAGING — CT CT RENAL STONE PROTOCOL
2 of 4 series · 16 of 46 positions shown, 18 images · non-contrast
Comparison: [DATE]

CLINICAL DATA: Hematuria and flank pain, initial encounter

EXAM:
CT ABDOMEN AND PELVIS WITHOUT CONTRAST
TECHNIQUE: Multidetector CT imaging of the abdomen and pelvis was performed
following the standard protocol without IV contrast.

[Series 2: axial st · axial · 0.80mm/px · z∈[-442,-47]mm · 13 of 91 slices shown, 15 images]
[im 6/91  soft-tissue]
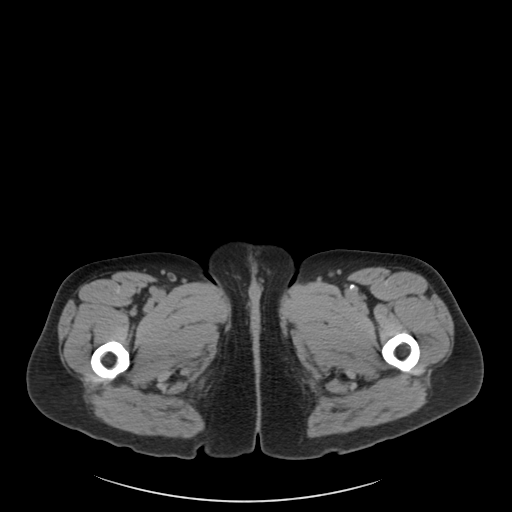
[im 6/91  bone]
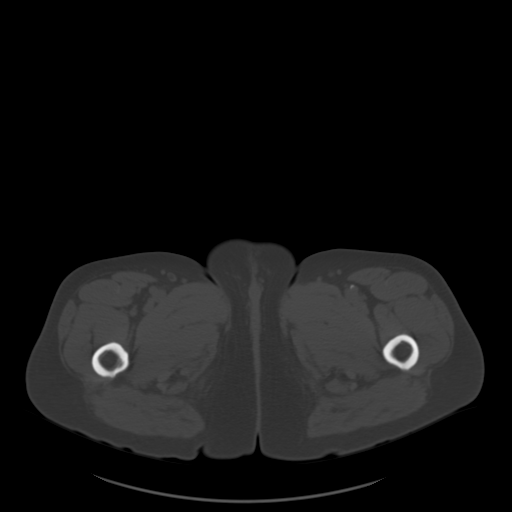
[im 11/91  soft-tissue]
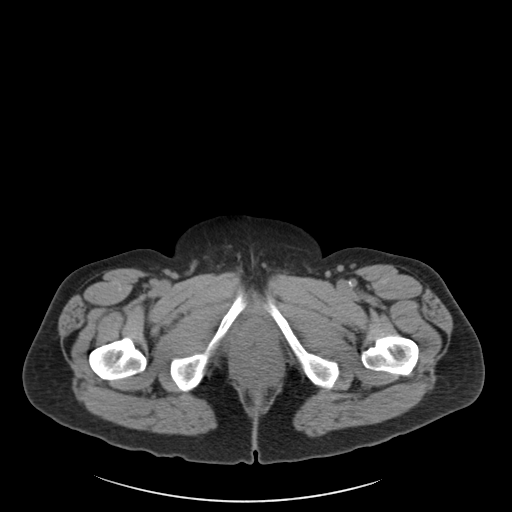
[im 22/91  soft-tissue]
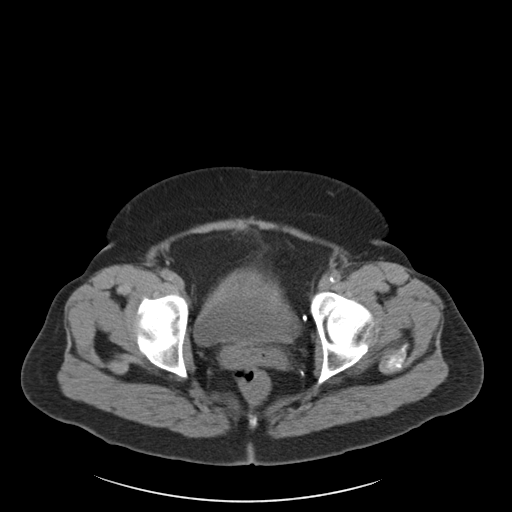
[im 27/91  soft-tissue]
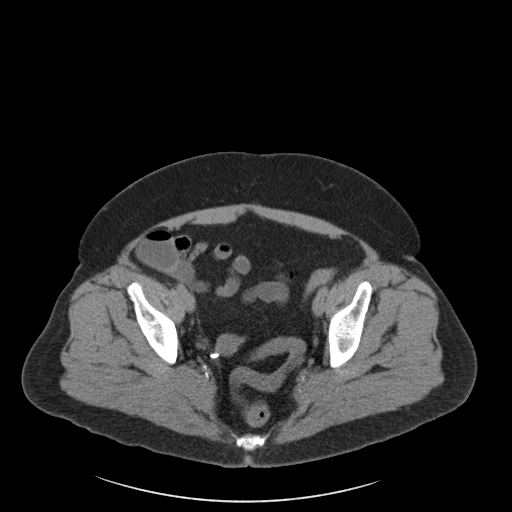
[im 32/91  soft-tissue]
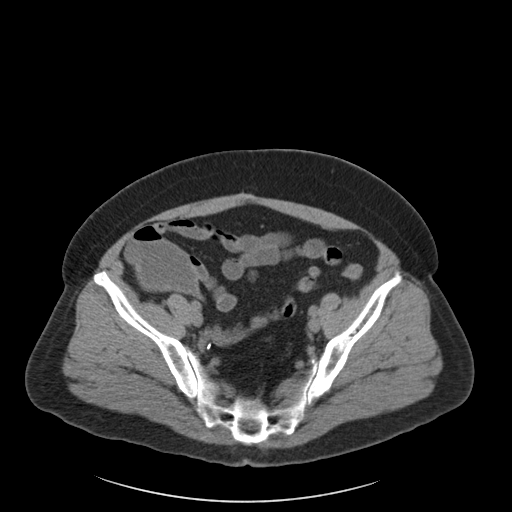
[im 38/91  soft-tissue]
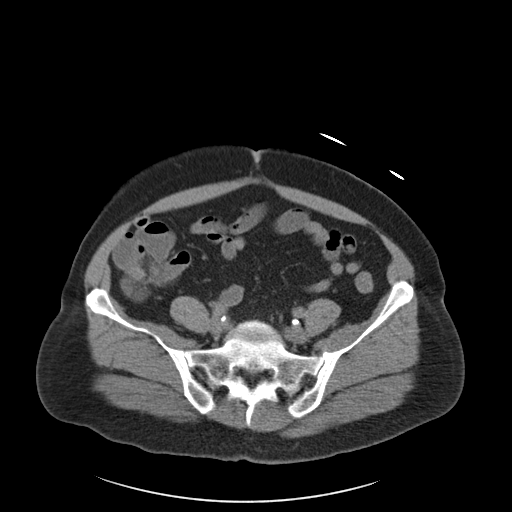
[im 48/91  soft-tissue]
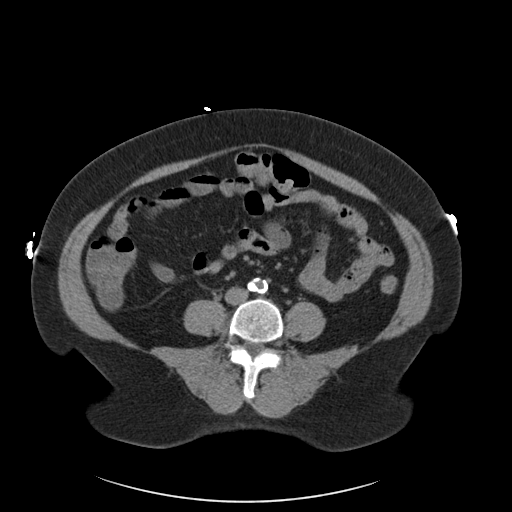
[im 53/91  soft-tissue]
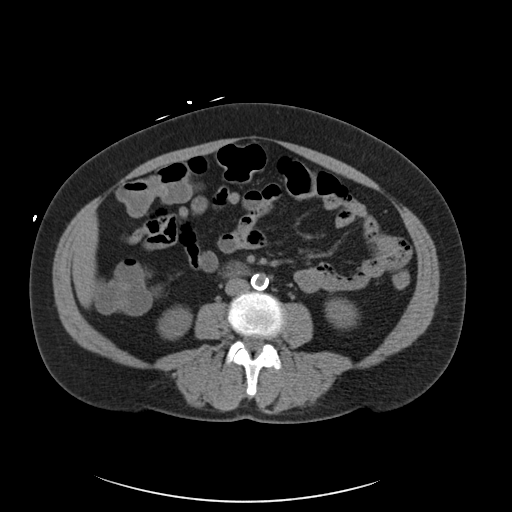
[im 59/91  soft-tissue]
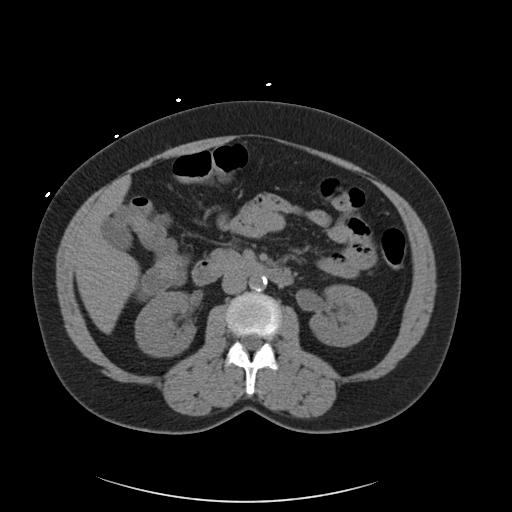
[im 59/91  bone]
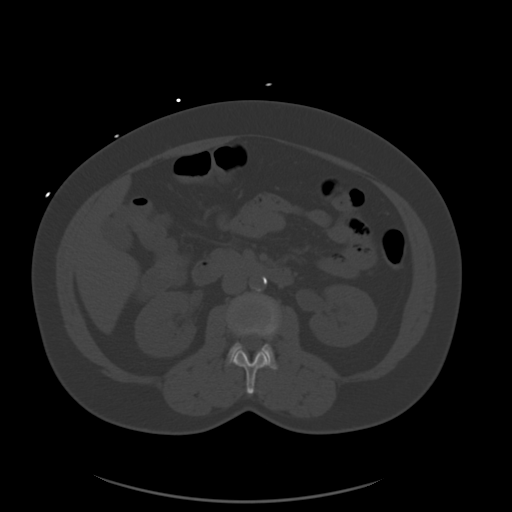
[im 64/91  soft-tissue]
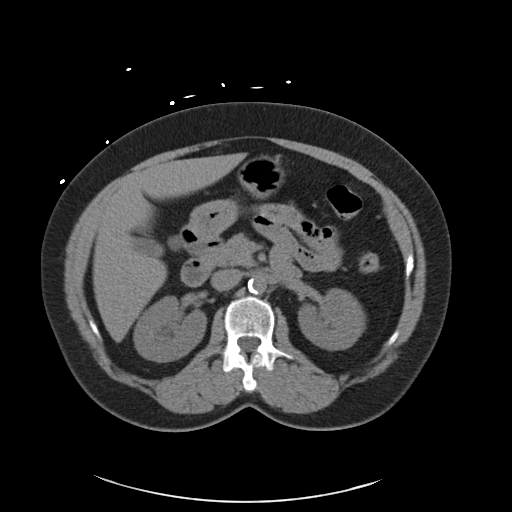
[im 69/91  soft-tissue]
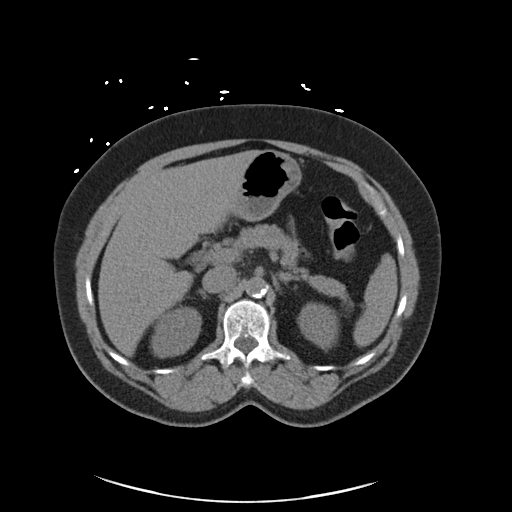
[im 80/91  soft-tissue]
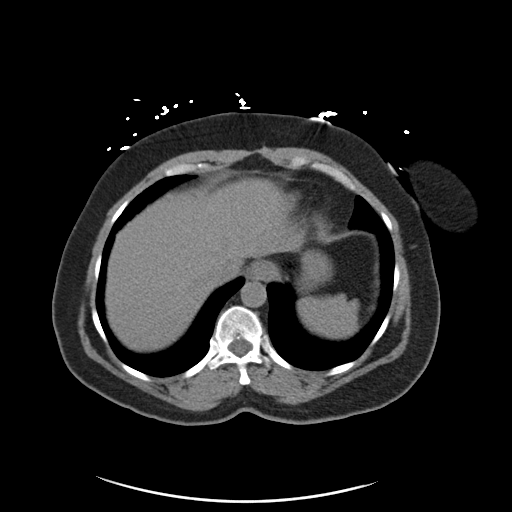
[im 85/91  soft-tissue]
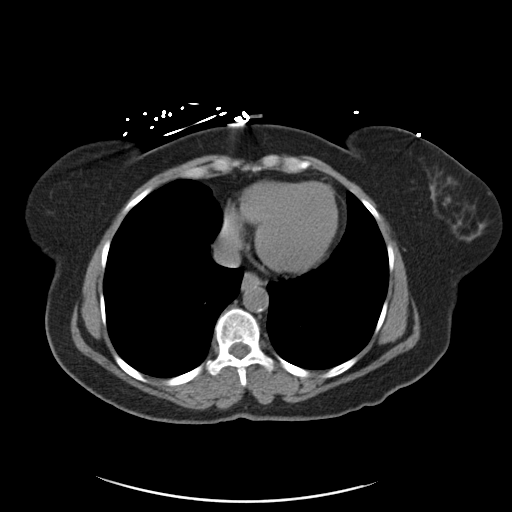

[Series 4: coronal · coronal · 0.80mm/px · 3 of 135 slices shown]
[im 45/135  soft-tissue]
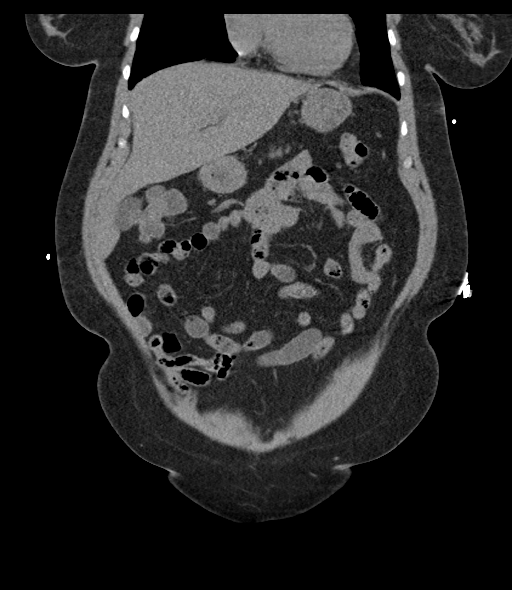
[im 60/135  soft-tissue]
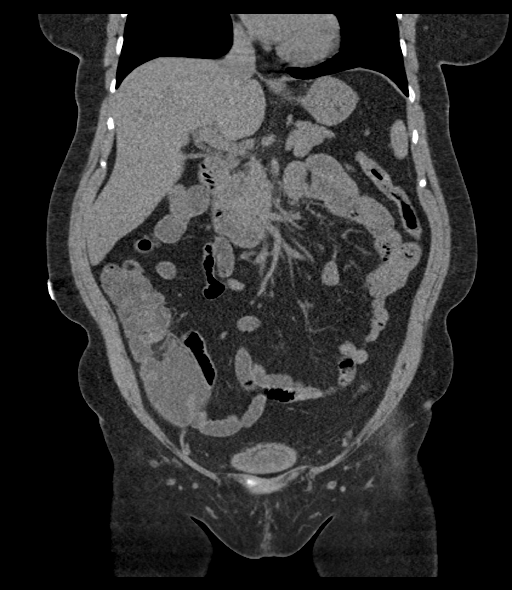
[im 75/135  soft-tissue]
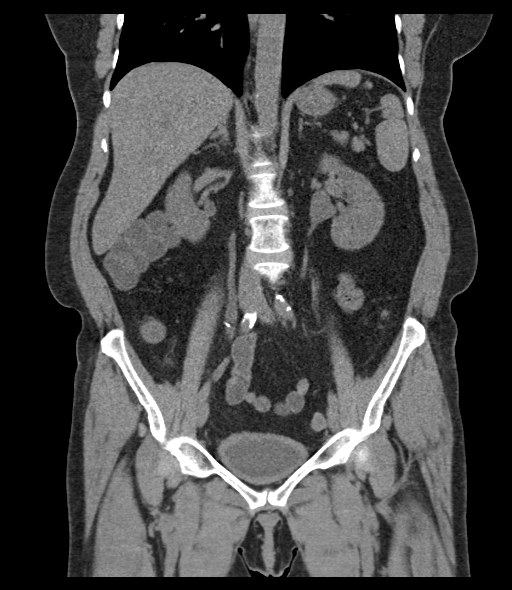

[16 of 46 positions shown; findings below may reference images not displayed]

FINDINGS: Lower chest: No acute abnormality.

Hepatobiliary: No focal liver abnormality is seen. No gallstones,
gallbladder wall thickening, or biliary dilatation.

Pancreas: Unremarkable. No pancreatic ductal dilatation or
surrounding inflammatory changes.

Spleen: Normal in size without focal abnormality.

Adrenals/Urinary Tract: Adrenal glands are within normal limits.
Kidneys demonstrate tiny nonobstructing renal calculi bilaterally.
The bladder is partially distended. No ureteral stones are seen.

Stomach/Bowel: No obstructive or inflammatory changes of colon are
noted. The appendix is within normal limits. No obstructive changes
of the small bowel are noted. The stomach is decompressed.

Vascular/Lymphatic: Aortic atherosclerosis. No enlarged abdominal or
pelvic lymph nodes.

Reproductive: Status post hysterectomy. No adnexal masses.

Other: No abdominal wall hernia or abnormality. No abdominopelvic
ascites.

Musculoskeletal: No acute or significant osseous findings.
IMPRESSION: Tiny nonobstructing renal calculi bilaterally. No acute abnormality
noted.

## 2020-01-05 MED ORDER — ACETAMINOPHEN 325 MG PO TABS
650.0000 mg | ORAL_TABLET | Freq: Once | ORAL | Status: AC | PRN
  Administered 2020-01-05: 650 mg via ORAL
  Filled 2020-01-05: qty 2

## 2020-01-05 MED ORDER — ACETAMINOPHEN 325 MG PO TABS: 650.0000 mg | ORAL_TABLET | Freq: Once | ORAL | Status: DC

## 2020-01-05 MED ORDER — EZETIMIBE 10 MG PO TABS
10.0000 mg | ORAL_TABLET | Freq: Every day | ORAL | Status: DC
  Administered 2020-01-06 – 2020-01-08 (×4): 10 mg via ORAL
  Filled 2020-01-05 (×4): qty 1

## 2020-01-05 MED ORDER — ATORVASTATIN CALCIUM 40 MG PO TABS
80.0000 mg | ORAL_TABLET | Freq: Every day | ORAL | Status: DC
  Administered 2020-01-06 – 2020-01-09 (×5): 80 mg via ORAL
  Filled 2020-01-05 (×4): qty 2

## 2020-01-05 MED ORDER — LEVOFLOXACIN IN D5W 750 MG/150ML IV SOLN
750.0000 mg | Freq: Once | INTRAVENOUS | Status: AC
  Administered 2020-01-05: 750 mg via INTRAVENOUS
  Filled 2020-01-05: qty 150

## 2020-01-05 MED ORDER — FUROSEMIDE 20 MG PO TABS: 20.0000 mg | ORAL_TABLET | Freq: Every day | ORAL | Status: DC | PRN

## 2020-01-05 MED ORDER — INSULIN ASPART 100 UNIT/ML ~~LOC~~ SOLN
0.0000 [IU] | Freq: Three times a day (TID) | SUBCUTANEOUS | Status: DC
  Administered 2020-01-06: 3 [IU] via SUBCUTANEOUS
  Administered 2020-01-07: 1 [IU] via SUBCUTANEOUS
  Administered 2020-01-09: 2 [IU] via SUBCUTANEOUS
  Filled 2020-01-05: qty 0.09

## 2020-01-05 MED ORDER — ALPRAZOLAM 0.25 MG PO TABS
0.2500 mg | ORAL_TABLET | Freq: Three times a day (TID) | ORAL | Status: DC | PRN
  Administered 2020-01-06: 0.25 mg via ORAL
  Filled 2020-01-05: qty 1

## 2020-01-05 MED ORDER — SODIUM CHLORIDE 0.9 % IV SOLN
1000.0000 mL | INTRAVENOUS | Status: DC
  Administered 2020-01-05: 1000 mL via INTRAVENOUS

## 2020-01-05 MED ORDER — TOPIRAMATE 25 MG PO TABS
75.0000 mg | ORAL_TABLET | Freq: Two times a day (BID) | ORAL | Status: DC
  Administered 2020-01-06 – 2020-01-09 (×8): 75 mg via ORAL
  Filled 2020-01-05 (×8): qty 3

## 2020-01-05 MED ORDER — ASPIRIN EC 81 MG PO TBEC
81.0000 mg | DELAYED_RELEASE_TABLET | Freq: Every day | ORAL | Status: DC
  Administered 2020-01-06 – 2020-01-09 (×5): 81 mg via ORAL
  Filled 2020-01-05 (×5): qty 1

## 2020-01-05 MED ORDER — NITROGLYCERIN 0.4 MG SL SUBL: 0.4000 mg | SUBLINGUAL_TABLET | SUBLINGUAL | Status: DC | PRN

## 2020-01-05 MED ORDER — ALIROCUMAB 75 MG/ML ~~LOC~~ SOAJ: 1.0000 "pen " | SUBCUTANEOUS | Status: DC

## 2020-01-05 MED ORDER — ESCITALOPRAM OXALATE 10 MG PO TABS
10.0000 mg | ORAL_TABLET | Freq: Every day | ORAL | Status: DC
  Administered 2020-01-06 – 2020-01-08 (×4): 10 mg via ORAL
  Filled 2020-01-05 (×4): qty 1

## 2020-01-05 MED ORDER — ISOSORBIDE MONONITRATE ER 60 MG PO TB24
120.0000 mg | ORAL_TABLET | Freq: Every day | ORAL | Status: DC
  Administered 2020-01-06 – 2020-01-09 (×4): 120 mg via ORAL
  Filled 2020-01-05 (×4): qty 2

## 2020-01-05 MED ORDER — ENOXAPARIN SODIUM 40 MG/0.4ML ~~LOC~~ SOLN
40.0000 mg | Freq: Every day | SUBCUTANEOUS | Status: DC
  Administered 2020-01-06 – 2020-01-08 (×4): 40 mg via SUBCUTANEOUS
  Filled 2020-01-05 (×3): qty 0.4

## 2020-01-05 MED ORDER — LEVOFLOXACIN IN D5W 750 MG/150ML IV SOLN
750.0000 mg | INTRAVENOUS | Status: DC
  Administered 2020-01-06 – 2020-01-07 (×2): 750 mg via INTRAVENOUS
  Filled 2020-01-05 (×2): qty 150

## 2020-01-05 MED ORDER — PANTOPRAZOLE SODIUM 40 MG PO TBEC
40.0000 mg | DELAYED_RELEASE_TABLET | Freq: Every day | ORAL | Status: DC
  Administered 2020-01-06 – 2020-01-09 (×5): 40 mg via ORAL
  Filled 2020-01-05 (×5): qty 1

## 2020-01-05 MED ORDER — CLOPIDOGREL BISULFATE 75 MG PO TABS
75.0000 mg | ORAL_TABLET | Freq: Every day | ORAL | Status: DC
  Administered 2020-01-06 – 2020-01-09 (×5): 75 mg via ORAL
  Filled 2020-01-05 (×5): qty 1

## 2020-01-05 MED ORDER — CARVEDILOL 12.5 MG PO TABS
12.5000 mg | ORAL_TABLET | Freq: Two times a day (BID) | ORAL | Status: DC
  Administered 2020-01-06 – 2020-01-09 (×8): 12.5 mg via ORAL
  Filled 2020-01-05 (×8): qty 1

## 2020-01-05 MED ORDER — LEVETIRACETAM 500 MG PO TABS
500.0000 mg | ORAL_TABLET | Freq: Two times a day (BID) | ORAL | Status: DC
  Administered 2020-01-06 – 2020-01-09 (×8): 500 mg via ORAL
  Filled 2020-01-05 (×8): qty 1

## 2020-01-05 MED ORDER — ACETAMINOPHEN 325 MG PO TABS
650.0000 mg | ORAL_TABLET | ORAL | Status: DC | PRN
  Administered 2020-01-06 – 2020-01-07 (×4): 650 mg via ORAL
  Filled 2020-01-05 (×5): qty 2

## 2020-01-05 MED ORDER — LACTATED RINGERS IV SOLN: INTRAVENOUS | Status: DC

## 2020-01-05 MED ORDER — ISOSORBIDE MONONITRATE ER 60 MG PO TB24: 120.0000 mg | ORAL_TABLET | Freq: Every day | ORAL | Status: DC

## 2020-01-05 MED ORDER — LISINOPRIL 20 MG PO TABS
20.0000 mg | ORAL_TABLET | Freq: Every day | ORAL | Status: DC
  Administered 2020-01-06 – 2020-01-09 (×5): 20 mg via ORAL
  Filled 2020-01-05 (×5): qty 1

## 2020-01-05 NOTE — ED Notes (Signed)
Lab to add on admission blood work to current tubes if able.

## 2020-01-05 NOTE — Progress Notes (Signed)
A consult was received from an ED physician for levaquin per pharmacy dosing.  The patient's profile has been reviewed for ht/wt/allergies/indication/available labs.   A one time order has been placed for levaquin 750mg  .  Further antibiotics/pharmacy consults should be ordered by admitting physician if indicated.                       Thank you, Kara Mead 01/05/2020  4:54 PM

## 2020-01-05 NOTE — ED Provider Notes (Signed)
[name]   CSN: [id number] Arrival date & time: 01/05/20  1517     History Chief Complaint  Patient presents with  . Pneumonia    Samantha Huffman is a 64 y.o. female.  64 year old female who presents her doctor's office after having several days of cough and shortness of breath.  Was diagnosed with pneumonia in the office which is in her left upper lobe.  Review of the records from the office show that patient had a negative Covid PCR along with influenza A/B.  Urinalysis does show blood and patient does have a history of renal colic.  She has been endorsing hematuria along with nausea vomiting or diarrhea.  Notes increasing dyspnea on exertion.  Was sent here for further management        Past Medical History:  Diagnosis Date  . Carotid artery stenosis    1-39% bilateral stenosis by dopplers 12/2017  . Coronary artery disease    a. s/p CABG 10/2012.  cath 05/2016 with moderate LM stenosis, mild LCx and RCA stenosis and occluded LAD with patent LIMA to LAD, free radial to OM.  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  Marland Kitchen Dyslipidemia   . Family history of adverse reaction to anesthesia    mother and sister ponv  . Generalized convulsive epilepsy without mention of intractable epilepsy   . GERD (gastroesophageal reflux disease)   . History of kidney stones   . Hypertension   . Memory deficit    slight  . Migraine    "controlled on daily RX " (06/11/2016)  . Nephrolithiasis 07/30/2015   S/p lithotripsy x 3 and right and left ureter stents  . OSA (obstructive sleep apnea)    cpap   . PONV (postoperative nausea and vomiting)   . Seizures ([place])    "controlled w/daily RX; started in 2012; dr thinks they might be from the migraines; last sz was early part of 2016" (06/11/2016)  . Type II diabetes mellitus (HCC)    metformin  . Vasovagal syncope 07/30/2015   2011    Patient Active Problem List   Diagnosis Date Noted  .  History of chest pain 09/26/2018  . Dyspnea on exertion 09/08/2018  . Chest pain, rule out acute myocardial infarction 10/30/2017  . Edema 01/25/2017  . Tobacco abuse 06/10/2016  . Headache 09/15/2015  . OSA (obstructive sleep apnea) 07/30/2015  . Nephrolithiasis 07/30/2015  . Vasovagal syncope 07/30/2015  . Carotid artery stenosis, asymptomatic 07/30/2015  . CAD (coronary artery disease) 08/22/2013  . Essential hypertension 08/22/2013  . Diabetes mellitus without complication ([place])   . Dyslipidemia   . Seizures ([place])   . Generalized convulsive epilepsy ([place]) 03/22/2013  . Memory deficit 03/22/2013    Past Surgical History:  Procedure Laterality Date  . CARDIAC CATHETERIZATION  2014; 06/11/2016  . CARDIAC CATHETERIZATION N/A 06/11/2016   Procedure: Left Heart Cath and Cors/Grafts Angiography;  Surgeon: Sherren Mocha, MD;  Location: [facility];  Service: Cardiovascular;  Laterality: N/A;  . CESAREAN SECTION    . CORONARY ANGIOPLASTY    . CORONARY ARTERY BYPASS GRAFT  March, 2014   LIMA to LAD, left radial to LCx x 2  . CYSTOSCOPY W/ URETERAL STENT PLACEMENT Left 01/16/2019   Procedure: CYSTOSCOPY WITH RETROGRADE PYELOGRAM/URETERAL LEFT STENT PLACEMENT;  Surgeon: Ardis Hughs, MD;  Location: [facility];  Service: Urology;  Laterality: Left;  . CYSTOSCOPY/URETEROSCOPY/HOLMIUM LASER/STENT PLACEMENT Left 01/24/2019   Procedure: CYSTOSCOPY, LEFT URETEROSCOPY, HOLMIUM  LASER, STONE REMOVAL AND STENT EXCHANGE;  Surgeon: Ardis Hughs, MD;  Location: [facility];  Service: Urology;  Laterality: Left;  . LITHOTRIPSY  X 3  . RIGHT/LEFT HEART CATH AND CORONARY/GRAFT ANGIOGRAPHY N/A 09/08/2018   Procedure: RIGHT/LEFT HEART CATH AND CORONARY/GRAFT ANGIOGRAPHY;  Surgeon: Martinique, Peter M, MD;  Location: [facility];  Service: Cardiovascular;  Laterality: N/A;  . TOTAL ABDOMINAL HYSTERECTOMY     ovaries took before hysterectomy  . TUBAL LIGATION    . URETERAL STENT PLACEMENT        OB History   No obstetric history on file.     Family History  Problem Relation Age of Onset  . Cancer Father   . Diabetes Father   . Diabetes Brother   . Stroke Brother   . Diabetes Brother   . Heart disease Brother        CAD with CABG  . Heart disease Sister        CAD with MI  . Heart attack Sister     Social History   Tobacco Use  . Smoking status: Current Some Day Smoker    Packs/day: 0.33    Years: 35.00    Pack years: 11.55    Types: Cigarettes    Last attempt to quit: 06/11/2016    Years since quitting: 3.5  . Smokeless tobacco: Former Systems developer    Quit date: 11/20/2012  Substance Use Topics  . Alcohol use: Yes    Comment: 06/11/2016 "might have a few drinks/month; usually wine"  . Drug use: No    Home Medications Prior to Admission medications   Medication Sig Start Date End Date Taking? Authorizing Provider  acetaminophen (TYLENOL) 325 MG tablet Take 2 tablets (650 mg total) by mouth every 4 (four) hours as needed for headache or mild pain. 06/11/16   Isaiah Serge, NP  Alirocumab (PRALUENT) 75 MG/ML SOAJ Inject 1 pen into the skin every 14 (fourteen) days. 04/13/19   Sueanne Margarita, MD  ALPRAZolam Duanne Moron) 0.25 MG tablet Take 0.25 mg by mouth 3 (three) times daily as needed for anxiety.  01/18/17   [provider]  aspirin EC 81 MG tablet Take 81 mg by mouth daily.    [provider]  atorvastatin (LIPITOR) 80 MG tablet Take 1 tablet (80 mg total) by mouth daily. 07/16/19   Sueanne Margarita, MD  carvedilol (COREG) 12.5 MG tablet Take 1 tablet (12.5 mg total) by mouth 2 (two) times daily. 07/16/19   Suzzanne Cloud, NP  clopidogrel (PLAVIX) 75 MG tablet Take 1 tablet (75 mg total) by mouth daily. 07/16/19   Sueanne Margarita, MD  clotrimazole-betamethasone (LOTRISONE) cream Apply 1 application topically daily as needed (skin folds (under breasts)).  01/13/19   [provider]  escitalopram (LEXAPRO) 10 MG tablet Take 10 mg by mouth at  bedtime.     [provider]  ezetimibe (ZETIA) 10 MG tablet TAKE 1 TABLET (10 MG TOTAL) BY MOUTH DAILY. Patient taking differently: Take 10 mg by mouth at bedtime.  11/20/18   Sueanne Margarita, MD  furosemide (LASIX) 20 MG tablet Take 20 mg by mouth daily as needed (fluid retention/swelling.).    [provider]  isosorbide mononitrate (IMDUR) 120 MG 24 hr tablet Take 1 tablet (120 mg total) by mouth daily. 07/16/19   Sueanne Margarita, MD  levETIRAcetam (KEPPRA) 500 MG tablet Take 1 tablet (500 mg total) by mouth 2 (two) times daily. 04/03/19  Suzzanne Cloud, NP  lisinopril (ZESTRIL) 20 MG tablet Take 1 tablet (20 mg total) by mouth daily. 07/16/19   Suzzanne Cloud, NP  metFORMIN (GLUCOPHAGE-XR) 500 MG 24 hr tablet Take 500 mg by mouth daily with breakfast.  06/29/18   [provider]  nitroGLYCERIN (NITROSTAT) 0.4 MG SL tablet Place 1 tablet (0.4 mg total) under the tongue every 5 (five) minutes x 3 doses as needed for chest pain. 06/11/16   Isaiah Serge, NP  pantoprazole (PROTONIX) 40 MG tablet TAKE 1 TABLET BY MOUTH EVERY DAY Patient taking differently: Take 40 mg by mouth daily.  01/09/19   Sueanne Margarita, MD  topiramate (TOPAMAX) 25 MG tablet Take 3 tablets (75 mg total) by mouth 2 (two) times daily. 07/16/19   Suzzanne Cloud, NP    Allergies    Depakote [divalproex sodium] and Penicillins  Review of Systems   Review of Systems  All other systems reviewed and are negative.   Physical Exam Updated Vital Signs BP (!) 149/75 (BP Location: Left Arm)   Pulse 100   Temp (!) 103.1 F (39.5 C) (Oral)   Resp 16   SpO2 97%   Physical Exam Vitals and nursing note reviewed.  Constitutional:      General: She is not in acute distress.    Appearance: Normal appearance. She is well-developed. She is not toxic-appearing.  HENT:     Head: Normocephalic and atraumatic.  Eyes:     General: Lids are normal.     Conjunctiva/sclera: Conjunctivae normal.     Pupils:  Pupils are equal, round, and reactive to light.  Neck:     Thyroid: No thyroid mass.     Trachea: No tracheal deviation.  Cardiovascular:     Rate and Rhythm: Normal rate and regular rhythm.     Heart sounds: Normal heart sounds. No murmur. No gallop.   Pulmonary:     Effort: Pulmonary effort is normal. No respiratory distress.     Breath sounds: Normal breath sounds. No stridor. No decreased breath sounds, wheezing, rhonchi or rales.  Abdominal:     General: Bowel sounds are normal. There is no distension.     Palpations: Abdomen is soft.     Tenderness: There is no abdominal tenderness. There is no rebound.  Musculoskeletal:        General: No tenderness. Normal range of motion.     Cervical back: Normal range of motion and neck supple.  Skin:    General: Skin is warm and dry.     Findings: No abrasion or rash.  Neurological:     Mental Status: She is alert and oriented to person, place, and time.     GCS: GCS eye subscore is 4. GCS verbal subscore is 5. GCS motor subscore is 6.     Cranial Nerves: No cranial nerve deficit.     Sensory: No sensory deficit.  Psychiatric:        Speech: Speech normal.        Behavior: Behavior normal.     ED Results / Procedures / Treatments   Labs (all labs ordered are listed, but only abnormal results are displayed) Labs Reviewed  CULTURE, BLOOD (ROUTINE X 2)  CULTURE, BLOOD (ROUTINE X 2)  URINE CULTURE  LACTIC ACID, PLASMA  LACTIC ACID, PLASMA  COMPREHENSIVE METABOLIC PANEL  CBC WITH DIFFERENTIAL/PLATELET  APTT  PROTIME-INR  URINALYSIS, ROUTINE W REFLEX MICROSCOPIC    EKG None  Radiology No results found.  Procedures Procedures (including critical care time)  Medications Ordered in ED Medications  0.9 %  sodium chloride infusion (has no administration in time range)  acetaminophen (TYLENOL) tablet 650 mg (650 mg Oral Given 01/05/20 1606)    ED Course  I have reviewed the triage vital signs and the nursing  notes.  Pertinent labs & imaging results that were available during my care of the patient were reviewed by me and considered in my medical decision making (see chart for details).    MDM Rules/Calculators/A&P                      Patient to be started on Rocephin and Zithromax for treatment of community-acquired pneumonia.  We will also obtain renal CT for evaluation of renal colic.  Blood work obtained as well and his pain at this time but patient will require inpatient hospitalization Final Clinical Impression(s) / ED Diagnoses Final diagnoses:  None    Rx / DC Orders ED Discharge Orders    None       Lacretia Leigh, MD 01/05/20 1636

## 2020-01-05 NOTE — ED Notes (Signed)
Pt unable to void; will continue to monitor.  

## 2020-01-05 NOTE — ED Notes (Signed)
Patient notified of need for urine sample.  

## 2020-01-05 NOTE — ED Triage Notes (Signed)
Patient reports sent from [facility] dx with pneumonia. Also reports hematuria related to known kidney stone. Febrile in triage. C/o weakness and fatigue.

## 2020-01-05 NOTE — H&P (Signed)
History and Physical    Samantha Huffman [id number] DOB: [birth year] DOA: 01/05/2020  Referring MD/NP/PA: Zenia Resides, MD PCP: Alroy Dust, Carlean Jews.Marlou Sa, MD  Patient coming from: Home  Chief Complaint: Cough, fever, and body aches  HPI: Samantha Huffman is a 64 y.o. female with medical history significant of CAD status post CABG in 2014, hyperlipidemia, hypertension, history of kidney stones, and diabetes who presents to [facility] due to productive cough and fever at home.  She states that she had been feeling ill with associated body aches as well since Wednesday with continued progression into Friday.  She states that due to this she then presented to urgent care where she had a chest x-ray revealing left upper lobe pneumonia.  She also had a negative Covid PCR and negative influenza test at that time as well.  She denies any associated shortness of breath despite her productive cough, fever, and pneumonia noted on x-ray.  She does admit to dyspnea on exertion however.  She also admits to intermittent hematuria and has a history of kidney stones.  She denies any chest pain, abdominal pain, dysuria, headache, weakness.  ED Course: In the ED she received IV fluid and IV antibiotics in the form of Levaquin.  She also underwent a CT kidney stone protocol given her history of kidney stones with hematuria and fever.  This revealed to bilateral small nonobstructing stones with no signs of hydronephrosis or pyelonephritis.  Patient will be admitted with inpatient status for pneumonia.  Review of Systems: As per HPI otherwise 10 point review of systems negative.    Past Medical History:  Diagnosis Date  . Carotid artery stenosis    1-39% bilateral stenosis by dopplers 12/2017  . Coronary artery disease    a. s/p CABG 10/2012.  cath 05/2016 with moderate LM stenosis, mild LCx and RCA stenosis and occluded LAD with patent LIMA to LAD, free radial to OM.  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  Marland Kitchen Dyslipidemia   .  Family history of adverse reaction to anesthesia    mother and sister ponv  . Generalized convulsive epilepsy without mention of intractable epilepsy   . GERD (gastroesophageal reflux disease)   . History of kidney stones   . Hypertension   . Memory deficit    slight  . Migraine    "controlled on daily RX " (06/11/2016)  . Nephrolithiasis 07/30/2015   S/p lithotripsy x 3 and right and left ureter stents  . OSA (obstructive sleep apnea)    cpap   . PONV (postoperative nausea and vomiting)   . Seizures ([place])    "controlled w/daily RX; started in 2012; dr thinks they might be from the migraines; last sz was early part of 2016" (06/11/2016)  . Type II diabetes mellitus (HCC)    metformin  . Vasovagal syncope 07/30/2015   2011    Past Surgical History:  Procedure Laterality Date  . CARDIAC CATHETERIZATION  2014; 06/11/2016  . CARDIAC CATHETERIZATION N/A 06/11/2016   Procedure: Left Heart Cath and Cors/Grafts Angiography;  Surgeon: Sherren Mocha, MD;  Location: [facility];  Service: Cardiovascular;  Laterality: N/A;  . CESAREAN SECTION    . CORONARY ANGIOPLASTY    . CORONARY ARTERY BYPASS GRAFT  March, 2014   LIMA to LAD, left radial to LCx x 2  . CYSTOSCOPY W/ URETERAL STENT PLACEMENT Left 01/16/2019   Procedure: CYSTOSCOPY WITH RETROGRADE PYELOGRAM/URETERAL LEFT STENT PLACEMENT;  Surgeon: Ardis Hughs, MD;  Location: [facility];  Service: Urology;  Laterality: Left;  . CYSTOSCOPY/URETEROSCOPY/HOLMIUM LASER/STENT PLACEMENT Left 01/24/2019   Procedure: CYSTOSCOPY, LEFT URETEROSCOPY, HOLMIUM LASER, STONE REMOVAL AND STENT EXCHANGE;  Surgeon: Ardis Hughs, MD;  Location: [facility];  Service: Urology;  Laterality: Left;  . LITHOTRIPSY  X 3  . RIGHT/LEFT HEART CATH AND CORONARY/GRAFT ANGIOGRAPHY N/A 09/08/2018   Procedure: RIGHT/LEFT HEART CATH AND CORONARY/GRAFT ANGIOGRAPHY;  Surgeon: Martinique, Peter M, MD;  Location: [facility];  Service: Cardiovascular;  Laterality:  N/A;  . TOTAL ABDOMINAL HYSTERECTOMY     ovaries took before hysterectomy  . TUBAL LIGATION    . URETERAL STENT PLACEMENT       reports that she has been smoking cigarettes. She has a 11.55 pack-year smoking history. She quit smokeless tobacco use about 7 years ago. She reports current alcohol use. She reports that she does not use drugs.  Allergies  Allergen Reactions  . Depakote [Divalproex Sodium] Other (See Comments)    Hyperammonemia  . Penicillins Hives    Other reaction(s): GI intolerance Has patient had a PCN reaction causing immediate rash, facial/tongue/throat swelling, SOB or lightheadedness with hypotension: YES Has patient had a PCN reaction causing severe rash involving mucus membranes or skin necrosis: NO Has patient had a PCN reaction that required hospitalizationNO Has patient had a PCN reaction occurring within the last 10 years: NO If all of the above answers are "NO", then may proceed with Cephalosporin use.    Family History  Problem Relation Age of Onset  . Cancer Father   . Diabetes Father   . Diabetes Brother   . Stroke Brother   . Diabetes Brother   . Heart disease Brother        CAD with CABG  . Heart disease Sister        CAD with MI  . Heart attack Sister     Prior to Admission medications   Medication Sig Start Date End Date Taking? Authorizing Provider  acetaminophen (TYLENOL) 325 MG tablet Take 2 tablets (650 mg total) by mouth every 4 (four) hours as needed for headache or mild pain. 06/11/16  Yes Isaiah Serge, NP  Alirocumab (PRALUENT) 75 MG/ML SOAJ Inject 1 pen into the skin every 14 (fourteen) days. 04/13/19  Yes Turner, Eber Hong, MD  ALPRAZolam Duanne Moron) 0.25 MG tablet Take 0.25 mg by mouth 3 (three) times daily as needed for anxiety.  01/18/17  Yes [provider]  aspirin EC 81 MG tablet Take 81 mg by mouth daily.   Yes [provider]  atorvastatin (LIPITOR) 80 MG tablet Take 1 tablet (80 mg total) by mouth daily.  07/16/19  Yes Turner, Eber Hong, MD  carvedilol (COREG) 12.5 MG tablet Take 1 tablet (12.5 mg total) by mouth 2 (two) times daily. 07/16/19  Yes Suzzanne Cloud, NP  clopidogrel (PLAVIX) 75 MG tablet Take 1 tablet (75 mg total) by mouth daily. 07/16/19  Yes Turner, Eber Hong, MD  clotrimazole-betamethasone (LOTRISONE) cream Apply 1 application topically daily as needed (skin folds (under breasts)).  01/13/19  Yes [provider]  escitalopram (LEXAPRO) 10 MG tablet Take 10 mg by mouth at bedtime.    Yes [provider]  ezetimibe (ZETIA) 10 MG tablet TAKE 1 TABLET (10 MG TOTAL) BY MOUTH DAILY. Patient taking differently: Take 10 mg by mouth at bedtime.  11/20/18  Yes Turner, Eber Hong, MD  furosemide (LASIX) 20 MG tablet Take 20 mg by mouth daily as needed (fluid retention/swelling.).   Yes [provider]  isosorbide mononitrate (IMDUR) 120 MG 24 hr tablet Take 1 tablet (120 mg total) by mouth daily. 07/16/19  Yes Turner, Eber Hong, MD  lisinopril (ZESTRIL) 20 MG tablet Take 1 tablet (20 mg total) by mouth daily. 07/16/19  Yes Suzzanne Cloud, NP  metFORMIN (GLUCOPHAGE-XR) 500 MG 24 hr tablet Take 500 mg by mouth daily with breakfast.  06/29/18  Yes [provider]  nitroGLYCERIN (NITROSTAT) 0.4 MG SL tablet Place 1 tablet (0.4 mg total) under the tongue every 5 (five) minutes x 3 doses as needed for chest pain. 06/11/16  Yes Isaiah Serge, NP  pantoprazole (PROTONIX) 40 MG tablet TAKE 1 TABLET BY MOUTH EVERY DAY Patient taking differently: Take 40 mg by mouth daily.  01/09/19  Yes Turner, Eber Hong, MD  Potassium Citrate 15 MEQ (1620 MG) TBCR Take 1 tablet by mouth in the morning and at bedtime.   Yes [provider]  topiramate (TOPAMAX) 25 MG tablet Take 3 tablets (75 mg total) by mouth 2 (two) times daily. 07/16/19  Yes Suzzanne Cloud, NP  levETIRAcetam (KEPPRA) 500 MG tablet Take 1 tablet (500 mg total) by mouth 2 (two) times daily. Patient not taking: Reported  on 01/05/2020 04/03/19   Suzzanne Cloud, NP    Physical Exam: Vitals:   01/05/20 1543 01/05/20 1630 01/05/20 1645 01/05/20 1815  BP: (!) 149/75 (!) 142/88 (!) 155/79 (!) 149/63  Pulse: 100 98 94 83  Resp: 16  (!) 37 (!) 31  Temp: (!) 103.1 F (39.5 C)     TempSrc: Oral     SpO2: 97% 99% 96% 98%      Constitutional: NAD, calm, comfortable Vitals:   01/05/20 1543 01/05/20 1630 01/05/20 1645 01/05/20 1815  BP: (!) 149/75 (!) 142/88 (!) 155/79 (!) 149/63  Pulse: 100 98 94 83  Resp: 16  (!) 37 (!) 31  Temp: (!) 103.1 F (39.5 C)     TempSrc: Oral     SpO2: 97% 99% 96% 98%   Eyes: PERRL, lids and conjunctivae normal ENMT: Mucous membranes are moist. Posterior pharynx clear of any exudate or lesions.mask in place Neck: normal, supple, no masses, no thyromegaly Respiratory: clear to auscultation bilaterally, no wheezing, no crackles. Normal respiratory effort. No accessory muscle use.  Cardiovascular: Regular rate and rhythm, no murmurs / rubs / gallops. No extremity edema. 2+ pedal pulses. Abdomen: no tenderness, no masses palpated. No hepatosplenomegaly. Bowel sounds positive.  No flank pain Musculoskeletal: no clubbing / cyanosis. No joint deformity upper and lower extremities. Good ROM, no contractures. Normal muscle tone.  Skin: no rashes, lesions, ulcers. No induration Neurologic: CN 2-12 grossly intact. Sensation intact. Strength 5/5 in all 4.  Psychiatric: Normal judgment and insight. Alert and oriented x 3. Normal mood.   Labs on Admission: I have personally reviewed following labs and imaging studies  CBC: Recent Labs  Lab 01/05/20 1659  WBC 10.4  NEUTROABS 7.3  HGB 14.1  HCT 42.7  MCV 96.6  PLT 0000000   Basic Metabolic Panel: Recent Labs  Lab 01/05/20 1659  NA 135  K 3.9  CL 104  CO2 20*  GLUCOSE 131*  BUN 12  CREATININE 1.02*  CALCIUM 9.4   GFR: CrCl cannot be calculated (Unknown ideal weight.). Liver Function Tests: Recent Labs  Lab 01/05/20 1659    AST 28  ALT 22  ALKPHOS 88  BILITOT 0.8  PROT 8.6*  ALBUMIN 4.4   No results for input(s): LIPASE, AMYLASE in  the last 168 hours. No results for input(s): AMMONIA in the last 168 hours. Coagulation Profile: Recent Labs  Lab 01/05/20 1659  INR NOT CALCULATED   Cardiac Enzymes: No results for input(s): CKTOTAL, CKMB, CKMBINDEX, TROPONINI in the last 168 hours. BNP (last 3 results) No results for input(s): PROBNP in the last 8760 hours. HbA1C: No results for input(s): HGBA1C in the last 72 hours. CBG: No results for input(s): GLUCAP in the last 168 hours. Lipid Profile: No results for input(s): CHOL, HDL, LDLCALC, TRIG, CHOLHDL, LDLDIRECT in the last 72 hours. Thyroid Function Tests: No results for input(s): TSH, T4TOTAL, FREET4, T3FREE, THYROIDAB in the last 72 hours. Anemia Panel: No results for input(s): VITAMINB12, FOLATE, FERRITIN, TIBC, IRON, RETICCTPCT in the last 72 hours. Urine analysis: No results found for: COLORURINE, APPEARANCEUR, LABSPEC, PHURINE, GLUCOSEU, HGBUR, BILIRUBINUR, KETONESUR, PROTEINUR, UROBILINOGEN, NITRITE, LEUKOCYTESUR Sepsis Labs: @LABRCNTIP (procalcitonin:4,lacticidven:4) )No results found for this or any previous visit (from the past 240 hour(s)).   Radiological Exams on Admission: CT Renal Stone Study  Result Date: 01/05/2020 CLINICAL DATA:  Hematuria and flank pain, initial encounter EXAM: CT ABDOMEN AND PELVIS WITHOUT CONTRAST TECHNIQUE: Multidetector CT imaging of the abdomen and pelvis was performed following the standard protocol without IV contrast. COMPARISON:  01/16/2019 FINDINGS: Lower chest: No acute abnormality. Hepatobiliary: No focal liver abnormality is seen. No gallstones, gallbladder wall thickening, or biliary dilatation. Pancreas: Unremarkable. No pancreatic ductal dilatation or surrounding inflammatory changes. Spleen: Normal in size without focal abnormality. Adrenals/Urinary Tract: Adrenal glands are within normal limits.  Kidneys demonstrate tiny nonobstructing renal calculi bilaterally. The bladder is partially distended. No ureteral stones are seen. Stomach/Bowel: No obstructive or inflammatory changes of colon are noted. The appendix is within normal limits. No obstructive changes of the small bowel are noted. The stomach is decompressed. Vascular/Lymphatic: Aortic atherosclerosis. No enlarged abdominal or pelvic lymph nodes. Reproductive: Status post hysterectomy. No adnexal masses. Other: No abdominal wall hernia or abnormality. No abdominopelvic ascites. Musculoskeletal: No acute or significant osseous findings. IMPRESSION: Tiny nonobstructing renal calculi bilaterally. No acute abnormality noted. Electronically Signed   By: Inez Catalina M.D.   On: 01/05/2020 17:49    EKG: Independently reviewed.  Sinus rhythm  Assessment/Plan Active Problems:   Generalized convulsive epilepsy ([place])   Diabetes mellitus without complication (HCC)   Dyslipidemia   CAD (coronary artery disease)   Essential hypertension   Nephrolithiasis   Dyspnea on exertion   Pneumonia   #Pneumonia -Patient with pneumonia on chest x-ray with cough, fever, body aches, and dyspnea on exertion. -She has an allergy to penicillins so we will continue IV Levaquin -We will continue LR at 75 mL/h  #Generalized convulsive epilepsy -Continue home Keppra and Topamax dosing  #Hypertension -Continuing carvedilol, Imdur, lisinopril.  #Hyperlipidemia -Continuing atorvastatin and Zetia -Will hold alirocumab  #CAD -Continuing aspirin and Plavix  #GERD -Continue Protonix  #Depression -Continuing Lexapro  #Nephrolithiasis -Nonobstructing, small -Continuing IV fluid and monitor.  #Diabetes -Holding Metformin. -AC at bedtime sliding scale insulin and Accu-Cheks  DVT prophylaxis: Lovenox  code Status: Full Family Communication: None  Disposition Plan: Home to self-care likely within the next 2 to 3 days pending resolution of fever  and improvement in pneumonia  consults called: None Admission status: Inpatient  Arlan Organ DO [facility]  01/05/2020, 8:27 PM

## 2020-01-05 NOTE — Progress Notes (Signed)
Pharmacy Antibiotic Note  Samantha Huffman is a 64 y.o. female admitted on 01/05/2020 with pneumonia.  Pharmacy has been consulted for levofloxacin dosing.  Pt has PCN allergy (hives), no history of cephalosporin use.  Today, 01/05/20 -WBC WNL -Ht/wt ordered. Most recent wt (Jan 2021) ~65 kg -SCr 1.02. CrCl ~ 55 mL/min using TBW 65 kg -Tmax 103.1   Plan:  Levofloxacin 750 mg IV q24h  Monitor renal function     Temp (24hrs), Avg:102.5 F (39.2 C), Min:101.8 F (38.8 C), Max:103.1 F (39.5 C)  Recent Labs  Lab 01/05/20 1658 01/05/20 1659  WBC  --  10.4  CREATININE  --  1.02*  LATICACIDVEN 1.1  --     CrCl cannot be calculated (Unknown ideal weight.).    Allergies  Allergen Reactions  . Depakote [Divalproex Sodium] Other (See Comments)    Hyperammonemia  . Penicillins Hives    Other reaction(s): GI intolerance Has patient had a PCN reaction causing immediate rash, facial/tongue/throat swelling, SOB or lightheadedness with hypotension: YES Has patient had a PCN reaction causing severe rash involving mucus membranes or skin necrosis: NO Has patient had a PCN reaction that required hospitalizationNO Has patient had a PCN reaction occurring within the last 10 years: NO If all of the above answers are "NO", then may proceed with Cephalosporin use.    Antimicrobials this admission: levofloxacin 5/15 >>   Dose adjustments this admission:  Microbiology results: 5/15 BCx: Sent 5/15 UCx: Sent   Thank you for allowing pharmacy to be a part of this patient's care.  Lenis Noon, PharmD 01/05/2020 9:30 PM

## 2020-01-06 ENCOUNTER — Encounter (HOSPITAL_COMMUNITY): Payer: Self-pay | Admitting: Internal Medicine

## 2020-01-06 ENCOUNTER — Inpatient Hospital Stay (HOSPITAL_COMMUNITY): Payer: Medicare HMO

## 2020-01-06 DIAGNOSIS — R569 Unspecified convulsions: Secondary | ICD-10-CM

## 2020-01-06 DIAGNOSIS — F32A Depression, unspecified: Secondary | ICD-10-CM | POA: Diagnosis present

## 2020-01-06 DIAGNOSIS — J181 Lobar pneumonia, unspecified organism: Secondary | ICD-10-CM

## 2020-01-06 DIAGNOSIS — A419 Sepsis, unspecified organism: Principal | ICD-10-CM

## 2020-01-06 DIAGNOSIS — G4733 Obstructive sleep apnea (adult) (pediatric): Secondary | ICD-10-CM

## 2020-01-06 DIAGNOSIS — F320 Major depressive disorder, single episode, mild: Secondary | ICD-10-CM

## 2020-01-06 LAB — COMPREHENSIVE METABOLIC PANEL
ALT: 27 U/L (ref 0–44)
AST: 29 U/L (ref 15–41)
Albumin: 3.4 g/dL — ABNORMAL LOW (ref 3.5–5.0)
Alkaline Phosphatase: 70 U/L (ref 38–126)
Anion gap: 8 (ref 5–15)
BUN: 12 mg/dL (ref 8–23)
CO2: 19 mmol/L — ABNORMAL LOW (ref 22–32)
Calcium: 9.2 mg/dL (ref 8.9–10.3)
Chloride: 111 mmol/L (ref 98–111)
Creatinine, Ser: 0.92 mg/dL (ref 0.44–1.00)
GFR calc Af Amer: >60 mL/min (ref >60)
GFR calc non Af Amer: >60 mL/min (ref >60)
Glucose, Bld: 131 mg/dL — ABNORMAL HIGH (ref 70–99)
Potassium: 4.1 mmol/L (ref 3.5–5.1)
Sodium: 138 mmol/L (ref 135–145)
Total Bilirubin: 0.8 mg/dL (ref 0.3–1.2)
Total Protein: 7.1 g/dL (ref 6.5–8.1)

## 2020-01-06 LAB — CBC WITH DIFFERENTIAL/PLATELET
Abs Immature Granulocytes: 0.02 10*3/uL (ref 0.00–0.07)
Basophils Absolute: 0 10*3/uL (ref 0.0–0.1)
Basophils Relative: 0 %
Eosinophils Absolute: 0 10*3/uL (ref 0.0–0.5)
Eosinophils Relative: 0 %
HCT: 37.8 % (ref 36.0–46.0)
Hemoglobin: 12.2 g/dL (ref 12.0–15.0)
Immature Granulocytes: 0 %
Lymphocytes Relative: 20 %
Lymphs Abs: 1.4 10*3/uL (ref 0.7–4.0)
MCH: 31 pg (ref 26.0–34.0)
MCHC: 32.3 g/dL (ref 30.0–36.0)
MCV: 96.2 fL (ref 80.0–100.0)
Monocytes Absolute: 0.8 10*3/uL (ref 0.1–1.0)
Monocytes Relative: 12 %
Neutro Abs: 4.6 10*3/uL (ref 1.7–7.7)
Neutrophils Relative %: 68 %
Platelets: 149 10*3/uL — ABNORMAL LOW (ref 150–400)
RBC: 3.93 MIL/uL (ref 3.87–5.11)
RDW: 12.4 % (ref 11.5–15.5)
WBC: 6.9 10*3/uL (ref 4.0–10.5)
nRBC: 0 % (ref 0.0–0.2)

## 2020-01-06 LAB — PROCALCITONIN: Procalcitonin: <0.1 ng/mL

## 2020-01-06 LAB — EXPECTORATED SPUTUM ASSESSMENT W GRAM STAIN, RFLX TO RESP C

## 2020-01-06 LAB — HIV ANTIBODY (ROUTINE TESTING W REFLEX): HIV Screen 4th Generation wRfx: NONREACTIVE

## 2020-01-06 LAB — HEMOGLOBIN A1C
Hgb A1c MFr Bld: 7.6 % — ABNORMAL HIGH (ref 4.8–5.6)
Mean Plasma Glucose: 171.42 mg/dL

## 2020-01-06 LAB — CBG MONITORING, ED
Glucose-Capillary: 119 mg/dL — ABNORMAL HIGH (ref 70–99)
Glucose-Capillary: 236 mg/dL — ABNORMAL HIGH (ref 70–99)

## 2020-01-06 LAB — STREP PNEUMONIAE URINARY ANTIGEN: Strep Pneumo Urinary Antigen: NEGATIVE

## 2020-01-06 LAB — GLUCOSE, CAPILLARY
Glucose-Capillary: 86 mg/dL (ref 70–99)
Glucose-Capillary: 126 mg/dL — ABNORMAL HIGH (ref 70–99)

## 2020-01-06 LAB — SARS CORONAVIRUS 2 BY RT PCR (HOSPITAL ORDER, PERFORMED IN ~~LOC~~ HOSPITAL LAB): SARS Coronavirus 2: NEGATIVE

## 2020-01-06 IMAGING — DX DG CHEST 1V PORT
1 series · 1 of 1 positions shown · non-contrast
Comparison: [DATE].  [DATE].

CLINICAL DATA: Pt presents to [HOSPITAL] due to productive cough
and fever at home. She states that she had been feeling ill with
associated body aches as well since [REDACTED] with continued
progression into [REDACTED]. H/o CAD, Type II Diabetes. Former smoker.

EXAM:
PORTABLE CHEST 1 VIEW

[chest ap]
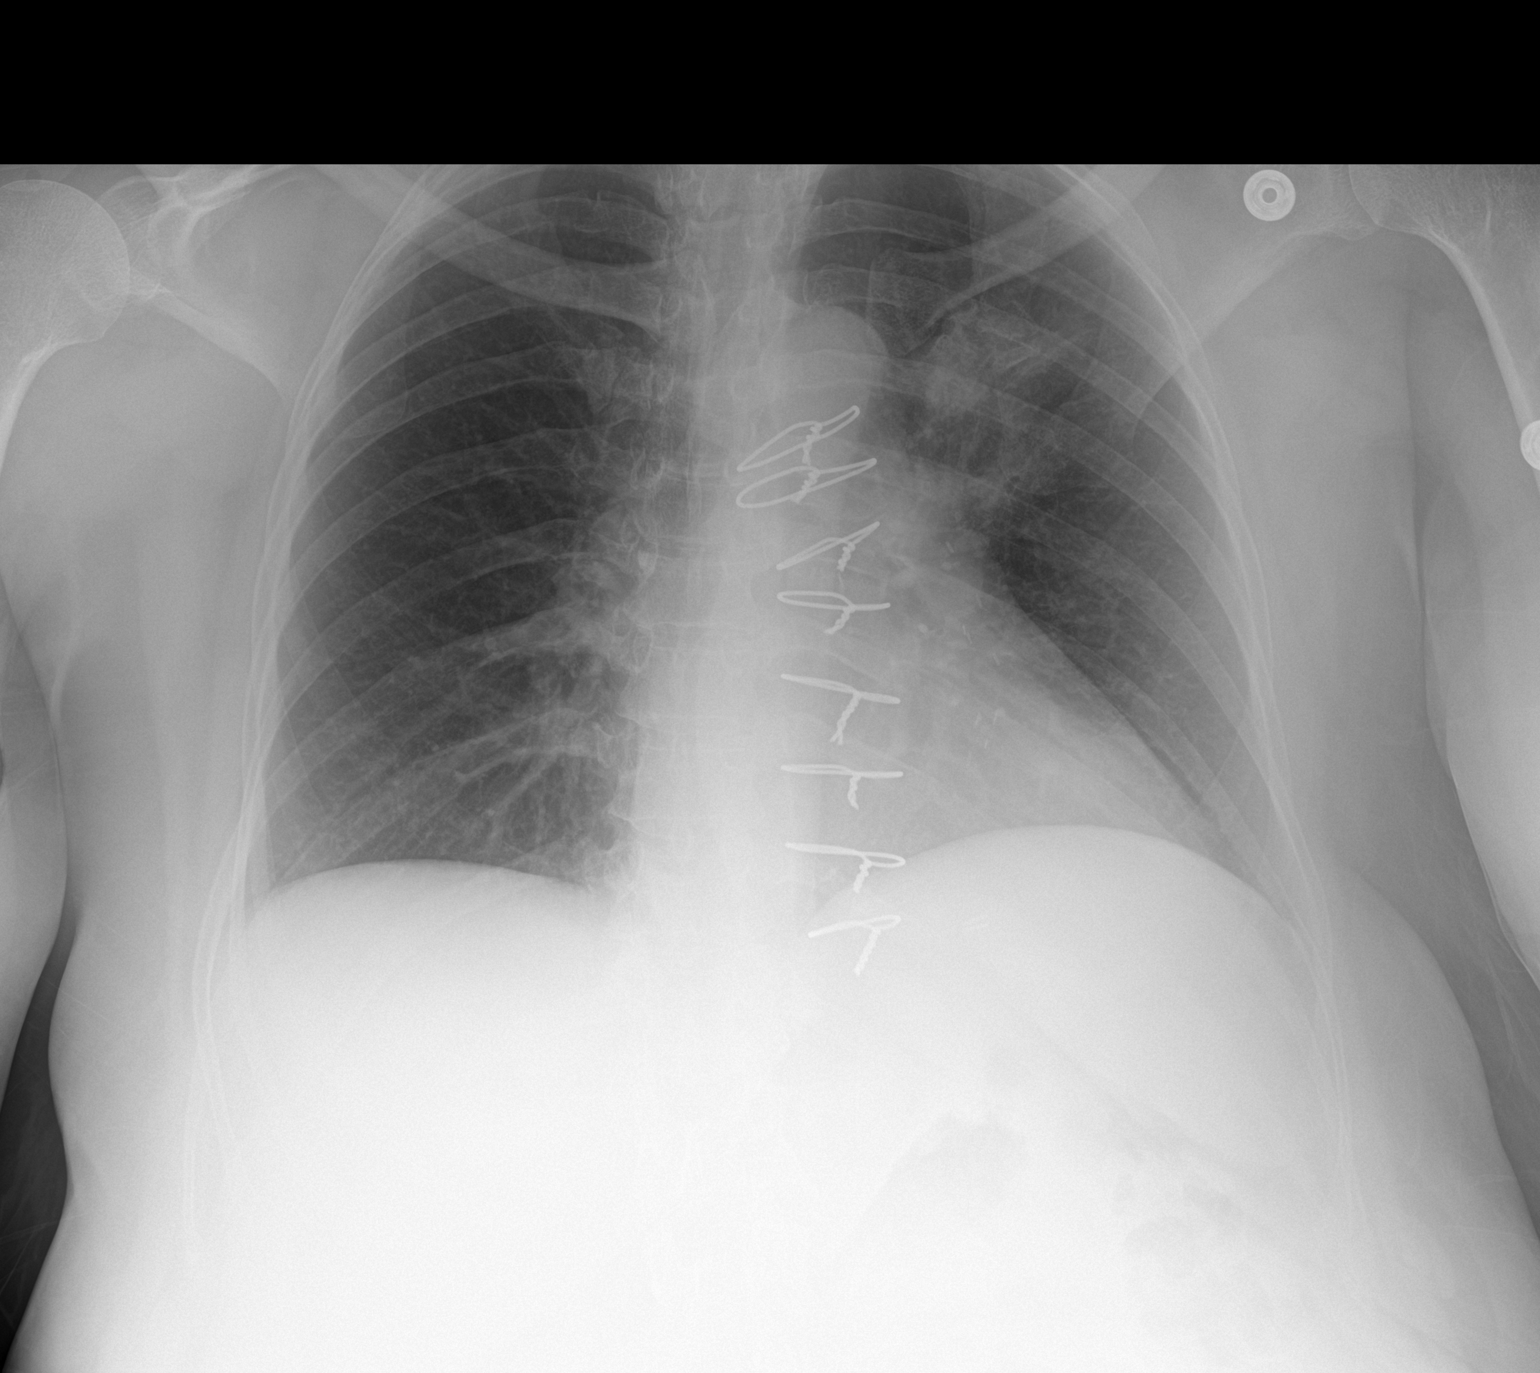

[1 of 1 positions shown; findings below may reference images not displayed]

FINDINGS: Left upper lobe opacity, extending from the left superior hilum, has
mildly increased in size. Remainder of the lungs is clear.

No pleural effusion or pneumothorax.

Stable changes from previous cardiac surgery. Cardiac silhouette is
normal in size. No mediastinal or hilar masses.

Skeletal structures are grossly intact.
IMPRESSION: 1. Left upper lobe pneumonia, increased when compared to the
previous day's chest radiograph.

## 2020-01-06 MED ORDER — IPRATROPIUM-ALBUTEROL 0.5-2.5 (3) MG/3ML IN SOLN
3.0000 mL | Freq: Three times a day (TID) | RESPIRATORY_TRACT | Status: DC
  Administered 2020-01-06 – 2020-01-07 (×2): 3 mL via RESPIRATORY_TRACT
  Filled 2020-01-06 (×2): qty 3

## 2020-01-06 MED ORDER — LOPERAMIDE HCL 2 MG PO CAPS
2.0000 mg | ORAL_CAPSULE | ORAL | Status: DC | PRN
  Administered 2020-01-06: 2 mg via ORAL
  Filled 2020-01-06: qty 1

## 2020-01-06 MED ORDER — ALBUTEROL SULFATE (2.5 MG/3ML) 0.083% IN NEBU: 2.5000 mg | INHALATION_SOLUTION | RESPIRATORY_TRACT | Status: DC | PRN

## 2020-01-06 MED ORDER — ONDANSETRON HCL 4 MG/2ML IJ SOLN
4.0000 mg | Freq: Four times a day (QID) | INTRAMUSCULAR | Status: DC | PRN
  Administered 2020-01-06: 4 mg via INTRAVENOUS
  Filled 2020-01-06: qty 2

## 2020-01-06 MED ORDER — IPRATROPIUM-ALBUTEROL 0.5-2.5 (3) MG/3ML IN SOLN: 3.0000 mL | Freq: Four times a day (QID) | RESPIRATORY_TRACT | Status: DC

## 2020-01-06 MED ORDER — IPRATROPIUM BROMIDE HFA 17 MCG/ACT IN AERS
2.0000 | INHALATION_SPRAY | Freq: Four times a day (QID) | RESPIRATORY_TRACT | Status: DC
  Filled 2020-01-06: qty 12.9

## 2020-01-06 NOTE — Progress Notes (Signed)
PROGRESS NOTE    Samantha Huffman  [id number] DOB: 1956-01-02 DOA: 01/05/2020 PCP: Alroy Dust, L.Marlou Sa, MD     Brief Narrative:  64 y.o. DF PMHx  CAD status post CABG in 2014, HTN, HLD, nephrolithiasis, DM type II uncontrolled with complication,   presents to [place] [facility] long due to productive cough and fever at home.  She states that she had been feeling ill with associated body aches as well since Wednesday with continued progression into Friday.  She states that due to this she then presented to urgent care where she had a chest x-ray revealing left upper lobe pneumonia.  She also had a negative Covid PCR and negative influenza test at that time as well.  She denies any associated shortness of breath despite her productive cough, fever, and pneumonia noted on x-ray.  She does admit to dyspnea on exertion however.  She also admits to intermittent hematuria and has a history of kidney stones.  She denies any chest pain, abdominal pain, dysuria, headache, weakness.  ED Course: In the ED she received IV fluid and IV antibiotics in the form of Levaquin.  She also underwent a CT kidney stone protocol given her history of kidney stones with hematuria and fever.  This revealed to bilateral small nonobstructing stones with no signs of hydronephrosis or pyelonephritis.  Patient will be admitted with inpatient status for pneumonia.  Review of Systems: As per HPI otherwise 10 point review of systems negative.    Subjective: A/O x4, positive S OB, positive productive cough, negative CP, negative abdominal pain, positive diarrhea, positive nausea   Assessment & Plan:   Active Problems:   Generalized convulsive epilepsy ([place])   Diabetes mellitus without complication ([place])   Dyslipidemia   CAD (coronary artery disease)   Essential hypertension   Nephrolithiasis   Dyspnea on exertion   Pneumonia  Sepsis/pneumonia -On admission fever> 38 C, HR> 90.  CXR positive for opacification -Patient not  evaluated for coronavirus. -5/16 coronavirus pending -DuoNeb nebulizer r QID -Flutter valve -Incentive spirometry -Titrate O2 to maintain SPO2> 88% -PCXR pending  OSA -CPAP per respiratory  Seizures -Keppra 500 mg BID -Topiramate 75 mg BID  Depression -Lexapro 10 mg daily  Essential HTN/CAD -Coreg 12.5 mg BID -Plavix 75 mg daily -Furosemide 20 mg daily PRN -Imdur 120 mg daily -Lisinopril 20 mg daily -NTG 0.4 mg  HLD -Lipitor -Zetia -Hold Alirocumab -Lipid panel pending  GERD -Continue Protonix  Nephrolithiasis -Nonobstructing, small -Continuing IV fluid and monitor.  DM type II uncontrolled with complication -123456 hemoglobin A1c= 7.6 -Holding Metformin. -Sensitive SSI    DVT prophylaxis: Lovenox Code Status: Full Family Communication:  Disposition Plan: TBD Status is: Inpatient    Dispo: The patient is from: Home              Anticipated d/c is to: Home              Anticipated d/c date is: 21 May              Patient currently unstable      Consultants:    Procedures/Significant Events:    I have personally reviewed and interpreted all radiology studies and my findings are as above.  VENTILATOR SETTINGS:    Cultures 5/16 SARS coronavirus negative   Antimicrobials: Anti-infectives (From admission, onward)   Start     Dose/Rate Route Frequency Ordered Stop   01/06/20 1700  levofloxacin (LEVAQUIN) IVPB 750 mg     750 mg 100 mL/hr over 90 Minutes  Intravenous Every 24 hours 01/05/20 1937 01/11/20 1659   01/05/20 1645  levofloxacin (LEVAQUIN) IVPB 750 mg     750 mg 100 mL/hr over 90 Minutes Intravenous  Once 01/05/20 1637 01/05/20 1914       Devices    LINES / TUBES:      Continuous Infusions: . sodium chloride Stopped (01/05/20 2334)  . lactated ringers 75 mL/hr at 01/06/20 0148  . levofloxacin (LEVAQUIN) IV       Objective: Vitals:   01/06/20 0905 01/06/20 0942 01/06/20 1015 01/06/20 1137  BP: 139/61 139/61  134/64 131/71  Pulse: 76 76 73 82  Resp: 20 (!) 36 20 18  Temp:      TempSrc:      SpO2: 97% 98% 100% 97%    Intake/Output Summary (Last 24 hours) at 01/06/2020 1213 Last data filed at 01/06/2020 0148 Gross per 24 hour  Intake 946.12 ml  Output --  Net 946.12 ml   There were no vitals filed for this visit.  Examination:  General: A/O x4, positive acute respiratory distress Eyes: negative scleral hemorrhage, negative anisocoria, negative icterus ENT: Negative Runny nose, negative gingival bleeding, Neck:  Negative scars, masses, torticollis, lymphadenopathy, JVD Lungs: Decreased breath sounds bilaterally without wheezes or crackles Cardiovascular: Regular rate and rhythm without murmur gallop or rub normal S1 and S2 Abdomen: negative abdominal pain, nondistended, positive soft, bowel sounds, no rebound, no ascites, no appreciable mass Extremities: No significant cyanosis, clubbing, or edema bilateral lower extremities Skin: Negative rashes, lesions, ulcers Psychiatric:  Negative depression, negative anxiety, negative fatigue, negative mania  Central nervous system:  Cranial nerves II through XII intact, tongue/uvula midline, all extremities muscle strength 5/5, sensation intact throughout,negative dysarthria, negative expressive aphasia, negative receptive aphasia.  .     Data Reviewed: Care during the described time interval was provided by me .  I have reviewed this patient's available data, including medical history, events of note, physical examination, and all test results as part of my evaluation.  CBC: Recent Labs  Lab 01/05/20 1659 01/06/20 0500  WBC 10.4 6.9  NEUTROABS 7.3 4.6  HGB 14.1 12.2  HCT 42.7 37.8  MCV 96.6 96.2  PLT 175 123456*   Basic Metabolic Panel: Recent Labs  Lab 01/05/20 1659 01/06/20 0500  NA 135 138  K 3.9 4.1  CL 104 111  CO2 20* 19*  GLUCOSE 131* 131*  BUN 12 12  CREATININE 1.02* 0.92  CALCIUM 9.4 9.2   GFR: CrCl cannot be  calculated (Unknown ideal weight.). Liver Function Tests: Recent Labs  Lab 01/05/20 1659 01/06/20 0500  AST 28 29  ALT 22 27  ALKPHOS 88 70  BILITOT 0.8 0.8  PROT 8.6* 7.1  ALBUMIN 4.4 3.4*   No results for input(s): LIPASE, AMYLASE in the last 168 hours. No results for input(s): AMMONIA in the last 168 hours. Coagulation Profile: Recent Labs  Lab 01/05/20 1659  INR NOT CALCULATED   Cardiac Enzymes: No results for input(s): CKTOTAL, CKMB, CKMBINDEX, TROPONINI in the last 168 hours. BNP (last 3 results) No results for input(s): PROBNP in the last 8760 hours. HbA1C: Recent Labs    01/05/20 1658  HGBA1C 7.6*   CBG: Recent Labs  Lab 01/05/20 2255 01/06/20 0727 01/06/20 1208  GLUCAP 112* 119* 236*   Lipid Profile: No results for input(s): CHOL, HDL, LDLCALC, TRIG, CHOLHDL, LDLDIRECT in the last 72 hours. Thyroid Function Tests: No results for input(s): TSH, T4TOTAL, FREET4, T3FREE, THYROIDAB in the last 72 hours. Anemia  Panel: No results for input(s): VITAMINB12, FOLATE, FERRITIN, TIBC, IRON, RETICCTPCT in the last 72 hours. Sepsis Labs: Recent Labs  Lab 01/05/20 1658  LATICACIDVEN 1.1    Recent Results (from the past 240 hour(s))  Blood Culture (routine x 2)     Status: None (Preliminary result)   Collection Time: 01/05/20  4:25 PM   Specimen: BLOOD  Result Value Ref Range Status   Specimen Description   Final    BLOOD RIGHT ANTECUBITAL Performed at [facility] [address] [place], [place] [address]    Special Requests   Final    BOTTLES DRAWN AEROBIC AND ANAEROBIC Blood Culture adequate volume Performed at [facility] [address], [place], [place] [address]    Culture   Final    NO GROWTH < 12 HOURS Performed at [facility] [address] [place], [place] [address]    Report Status PENDING  Incomplete  Blood Culture (routine x 2)     Status: None (Preliminary result)   Collection Time:  01/05/20  4:30 PM   Specimen: BLOOD  Result Value Ref Range Status   Specimen Description   Final    BLOOD RIGHT WRIST Performed at [facility] [address], [place], [place] [address]    Special Requests   Final    BOTTLES DRAWN AEROBIC AND ANAEROBIC Blood Culture adequate volume Performed at [facility] [address], [place], [place] [address]    Culture   Final    NO GROWTH < 12 HOURS Performed at [facility] [address] [place], [place] [address]    Report Status PENDING  Incomplete         Radiology Studies: CT Renal Stone Study  Result Date: 01/05/2020 CLINICAL DATA:  Hematuria and flank pain, initial encounter EXAM: CT ABDOMEN AND PELVIS WITHOUT CONTRAST TECHNIQUE: Multidetector CT imaging of the abdomen and pelvis was performed following the standard protocol without IV contrast. COMPARISON:  01/16/2019 FINDINGS: Lower chest: No acute abnormality. Hepatobiliary: No focal liver abnormality is seen. No gallstones, gallbladder wall thickening, or biliary dilatation. Pancreas: Unremarkable. No pancreatic ductal dilatation or surrounding inflammatory changes. Spleen: Normal in size without focal abnormality. Adrenals/Urinary Tract: Adrenal glands are within normal limits. Kidneys demonstrate tiny nonobstructing renal calculi bilaterally. The bladder is partially distended. No ureteral stones are seen. Stomach/Bowel: No obstructive or inflammatory changes of colon are noted. The appendix is within normal limits. No obstructive changes of the small bowel are noted. The stomach is decompressed. Vascular/Lymphatic: Aortic atherosclerosis. No enlarged abdominal or pelvic lymph nodes. Reproductive: Status post hysterectomy. No adnexal masses. Other: No abdominal wall hernia or abnormality. No abdominopelvic ascites. Musculoskeletal: No acute or significant osseous findings. IMPRESSION: Tiny nonobstructing renal calculi bilaterally. No  acute abnormality noted. Electronically Signed   By: Inez Catalina M.D.   On: 01/05/2020 17:49        Scheduled Meds: . aspirin EC  81 mg Oral Daily  . atorvastatin  80 mg Oral Daily  . carvedilol  12.5 mg Oral BID  . clopidogrel  75 mg Oral Daily  . enoxaparin (LOVENOX) injection  40 mg Subcutaneous QHS  . escitalopram  10 mg Oral QHS  . ezetimibe  10 mg Oral QHS  . insulin aspart  0-9 Units Subcutaneous TID WC  . isosorbide mononitrate  120 mg Oral Daily  . levETIRAcetam  500 mg Oral BID  . lisinopril  20 mg Oral Daily  .  pantoprazole  40 mg Oral Daily  . topiramate  75 mg Oral BID   Continuous Infusions: . sodium chloride Stopped (01/05/20 2334)  . lactated ringers 75 mL/hr at 01/06/20 0148  . levofloxacin (LEVAQUIN) IV       LOS: 1 day    Time spent:40 min    Sovereign Ramiro, Geraldo Docker, MD [facility] Pager [phone]  If 7PM-7AM, please contact night-coverage [email or web address] Password [facility] 01/06/2020, 12:13 PM

## 2020-01-06 NOTE — Progress Notes (Signed)
Obtained order from Dr Sherral Hammers on rounds to see pt to change to droplet precautions. COVID negative. Need sputum sample.

## 2020-01-06 NOTE — Progress Notes (Signed)
Patient is a yellow MEWS because her temperature is 101.7 F, PRN Tylenol 625 mg PO was given and mews protocol was initiated.

## 2020-01-06 NOTE — Plan of Care (Signed)
  Problem: Nutrition: Goal: Adequate nutrition will be maintained Outcome: Progressing   Problem: Elimination: Goal: Will not experience complications related to bowel motility Outcome: Progressing   Problem: Pain Managment: Goal: General experience of comfort will improve Outcome: Progressing   

## 2020-01-07 LAB — COMPREHENSIVE METABOLIC PANEL
ALT: 38 U/L (ref 0–44)
AST: 41 U/L (ref 15–41)
Albumin: 3 g/dL — ABNORMAL LOW (ref 3.5–5.0)
Alkaline Phosphatase: 66 U/L (ref 38–126)
Anion gap: 10 (ref 5–15)
BUN: 11 mg/dL (ref 8–23)
CO2: 18 mmol/L — ABNORMAL LOW (ref 22–32)
Calcium: 8.6 mg/dL — ABNORMAL LOW (ref 8.9–10.3)
Chloride: 110 mmol/L (ref 98–111)
Creatinine, Ser: 0.86 mg/dL (ref 0.44–1.00)
GFR calc Af Amer: >60 mL/min (ref >60)
GFR calc non Af Amer: >60 mL/min (ref >60)
Glucose, Bld: 90 mg/dL (ref 70–99)
Potassium: 4 mmol/L (ref 3.5–5.1)
Sodium: 138 mmol/L (ref 135–145)
Total Bilirubin: 0.4 mg/dL (ref 0.3–1.2)
Total Protein: 6.5 g/dL (ref 6.5–8.1)

## 2020-01-07 LAB — CBC WITH DIFFERENTIAL/PLATELET
Abs Immature Granulocytes: 0.02 10*3/uL (ref 0.00–0.07)
Basophils Absolute: 0 10*3/uL (ref 0.0–0.1)
Basophils Relative: 0 %
Eosinophils Absolute: 0.1 10*3/uL (ref 0.0–0.5)
Eosinophils Relative: 1 %
HCT: 34.8 % — ABNORMAL LOW (ref 36.0–46.0)
Hemoglobin: 11.1 g/dL — ABNORMAL LOW (ref 12.0–15.0)
Immature Granulocytes: 0 %
Lymphocytes Relative: 32 %
Lymphs Abs: 1.7 10*3/uL (ref 0.7–4.0)
MCH: 31.4 pg (ref 26.0–34.0)
MCHC: 31.9 g/dL (ref 30.0–36.0)
MCV: 98.6 fL (ref 80.0–100.0)
Monocytes Absolute: 0.6 10*3/uL (ref 0.1–1.0)
Monocytes Relative: 11 %
Neutro Abs: 2.9 10*3/uL (ref 1.7–7.7)
Neutrophils Relative %: 56 %
Platelets: 140 10*3/uL — ABNORMAL LOW (ref 150–400)
RBC: 3.53 MIL/uL — ABNORMAL LOW (ref 3.87–5.11)
RDW: 12.8 % (ref 11.5–15.5)
WBC: 5.2 10*3/uL (ref 4.0–10.5)
nRBC: 0 % (ref 0.0–0.2)

## 2020-01-07 LAB — URINE CULTURE

## 2020-01-07 LAB — PROCALCITONIN: Procalcitonin: <0.1 ng/mL

## 2020-01-07 LAB — LEGIONELLA PNEUMOPHILA SEROGP 1 UR AG: L. pneumophila Serogp 1 Ur Ag: NEGATIVE

## 2020-01-07 LAB — PHOSPHORUS: Phosphorus: 3 mg/dL (ref 2.5–4.6)

## 2020-01-07 LAB — MAGNESIUM: Magnesium: 1.9 mg/dL (ref 1.7–2.4)

## 2020-01-07 LAB — GLUCOSE, CAPILLARY
Glucose-Capillary: 100 mg/dL — ABNORMAL HIGH (ref 70–99)
Glucose-Capillary: 132 mg/dL — ABNORMAL HIGH (ref 70–99)
Glucose-Capillary: 101 mg/dL — ABNORMAL HIGH (ref 70–99)
Glucose-Capillary: 90 mg/dL (ref 70–99)

## 2020-01-07 MED ORDER — IPRATROPIUM-ALBUTEROL 0.5-2.5 (3) MG/3ML IN SOLN
3.0000 mL | Freq: Two times a day (BID) | RESPIRATORY_TRACT | Status: DC
  Administered 2020-01-07 – 2020-01-08 (×2): 3 mL via RESPIRATORY_TRACT
  Filled 2020-01-07 (×2): qty 3

## 2020-01-07 NOTE — Progress Notes (Signed)
Pt has refused CPAP for the night, machine remains at bedside.  RT to monitor and assess as needed.  

## 2020-01-07 NOTE — Progress Notes (Signed)
Ambulated patient 360 feet and oxygen saturation remained above 98%.

## 2020-01-07 NOTE — Progress Notes (Signed)
PROGRESS NOTE    Samantha Huffman  [id number] DOB: Mar 10, 1956 DOA: 01/05/2020 PCP: Alroy Dust, L.Marlou Sa, MD     Brief Narrative:  64 y.o. DF PMHx  CAD status post CABG in 2014, HTN, HLD, nephrolithiasis, DM type II uncontrolled with complication,   presents to [facility] due to productive cough and fever at home.  She states that she had been feeling ill with associated body aches as well since Wednesday with continued progression into Friday.  She states that due to this she then presented to urgent care where she had a chest x-ray revealing left upper lobe pneumonia.  She also had a negative Covid PCR and negative influenza test at that time as well.  She denies any associated shortness of breath despite her productive cough, fever, and pneumonia noted on x-ray.  She does admit to dyspnea on exertion however.  She also admits to intermittent hematuria and has a history of kidney stones.  She denies any chest pain, abdominal pain, dysuria, headache, weakness.  ED Course: In the ED she received IV fluid and IV antibiotics in the form of Levaquin.  She also underwent a CT kidney stone protocol given her history of kidney stones with hematuria and fever.  This revealed to bilateral small nonobstructing stones with no signs of hydronephrosis or pyelonephritis.  Patient will be admitted with inpatient status for pneumonia.  Review of Systems: As per HPI otherwise 10 point review of systems negative.    Subjective: 5/17 A/O x4, T-max last 24 hours 38.7 C positive DOE, negative S OB.  Negative CP.  Negative abdominal pain.  States lives alone and has no support system.  Extremely weak.     Assessment & Plan:   Active Problems:   Generalized convulsive epilepsy ([place])   Diabetes mellitus without complication ([place])   Dyslipidemia   Seizures (HCC)   CAD (coronary artery disease)   Essential hypertension   OSA (obstructive sleep apnea)   Nephrolithiasis   Dyspnea on exertion   Pneumonia   Lobar pneumonia (HCC)   Sepsis due to pneumonia ([place])   Depression  Sepsis/Left Upper Lobe Pneumonia -On admission fever> 38 C, HR> 90.  CXR positive for opacification -Patient not evaluated for coronavirus. -5/16 coronavirus negative -DuoNeb nebulizer r QID -Flutter valve -Incentive spirometry -Titrate O2 to maintain SPO2> 88% -5/16 PCXR; LUL opacification consistent with pneumonia -5/17 ambulatory SPO2 pending  OSA -CPAP per respiratory  Seizures -Keppra 500 mg BID -Topiramate 75 mg BID  Depression -Lexapro 10 mg daily  Essential HTN/CAD -Coreg 12.5 mg BID -Plavix 75 mg daily -Furosemide 20 mg daily PRN -Imdur 120 mg daily -Lisinopril 20 mg daily -NTG 0.4 mg  HLD -Lipitor 80 mg daily -Zetia 10 mg daily -Hold Alirocumab -Lipid panel pending  GERD -Continue Protonix  Nephrolithiasis -Nonobstructing, small -Continuing IV fluid and monitor.  DM type II uncontrolled with complication -123456 hemoglobin A1c= 7.6 -Holding Metformin. -Sensitive SSI   Goals of care -5/17 PT/OT consult; patient with LUL pneumonia extremely weak lives alone most likely will not be able to perform ADLs for herself.  Evaluate for CIR vs SNF   DVT prophylaxis: Lovenox Code Status: Full Family Communication: 5/17 spoke with [name] (daughter) discussed plan of care answered all questions Disposition Plan: TBD Status is: Inpatient    Dispo: The patient is from: Home              Anticipated d/c is to: Home  Anticipated d/c date is: 21 May              Patient currently unstable      Consultants:    Procedures/Significant Events:  5/16 PCXR; Left upper lobe pneumonia, increased when compared to the previous day's chest radiograph.    I have personally reviewed and interpreted all radiology studies and my findings are as above.  VENTILATOR SETTINGS:    Cultures 5/15 blood RIGHT AC NGTD  5/15 blood RIGHT wrist NGTD 5/15 urine positive multiple  species 5/16 sputum not acceptable for testing 5/16 SARS coronavirus negative   Antimicrobials: Anti-infectives (From admission, onward)   Start     Dose/Rate Route Frequency Ordered Stop   01/06/20 1700  levofloxacin (LEVAQUIN) IVPB 750 mg     750 mg 100 mL/hr over 90 Minutes Intravenous Every 24 hours 01/05/20 1937 01/11/20 1659   01/05/20 1645  levofloxacin (LEVAQUIN) IVPB 750 mg     750 mg 100 mL/hr over 90 Minutes Intravenous  Once 01/05/20 1637 01/05/20 1914       Devices    LINES / TUBES:      Continuous Infusions: . sodium chloride Stopped (01/05/20 2334)  . lactated ringers 75 mL/hr at 01/07/20 0631  . levofloxacin (LEVAQUIN) IV 750 mg (01/06/20 1659)     Objective: Vitals:   01/07/20 0137 01/07/20 0601 01/07/20 0601 01/07/20 0809  BP: 125/61 (!) 157/90 (!) 157/90   Pulse: 67 85 83 70  Resp: 18 17 17 16   Temp: (!) 97.5 F (36.4 C) 99.6 F (37.6 C) 99.6 F (37.6 C)   TempSrc: Oral Oral Oral   SpO2: 99% 98% 98% 98%  Weight:      Height:        Intake/Output Summary (Last 24 hours) at 01/07/2020 0835 Last data filed at 01/07/2020 0600 Gross per 24 hour  Intake 1709.94 ml  Output 500 ml  Net 1209.94 ml   Filed Weights   01/06/20 1333  Weight: 65.3 kg   Physical Exam:  General: A/O x4, no acute respiratory distress, extremely weak, positive DOE Eyes: negative scleral hemorrhage, negative anisocoria, negative icterus ENT: Negative Runny nose, negative gingival bleeding, Neck:  Negative scars, masses, torticollis, lymphadenopathy, JVD Lungs: Clear to auscultation bilaterally without wheezes or crackles Cardiovascular: Regular rate and rhythm without murmur gallop or rub normal S1 and S2 Abdomen: negative abdominal pain, nondistended, positive soft, bowel sounds, no rebound, no ascites, no appreciable mass Extremities: No significant cyanosis, clubbing, or edema bilateral lower extremities Skin: Negative rashes, lesions, ulcers Psychiatric:   Negative depression, negative anxiety, negative fatigue, negative mania  Central nervous system:  Cranial nerves II through XII intact, tongue/uvula midline, all extremities muscle strength 5/5, sensation intact throughout, negative dysarthria, negative expressive aphasia, negative receptive aphasia.  .     Data Reviewed: Care during the described time interval was provided by me .  I have reviewed this patient's available data, including medical history, events of note, physical examination, and all test results as part of my evaluation.  CBC: Recent Labs  Lab 01/05/20 1659 01/06/20 0500  WBC 10.4 6.9  NEUTROABS 7.3 4.6  HGB 14.1 12.2  HCT 42.7 37.8  MCV 96.6 96.2  PLT 175 [id number]*   Basic Metabolic Panel: Recent Labs  Lab 01/05/20 1659 01/06/20 0500  NA 135 138  K 3.9 4.1  CL 104 111  CO2 20* 19*  GLUCOSE 131* 131*  BUN 12 12  CREATININE 1.02* 0.92  CALCIUM 9.4 9.2  GFR: Estimated Creatinine Clearance: 54.1 mL/min (by C-G formula based on SCr of 0.92 mg/dL). Liver Function Tests: Recent Labs  Lab 01/05/20 1659 01/06/20 0500  AST 28 29  ALT 22 27  ALKPHOS 88 70  BILITOT 0.8 0.8  PROT 8.6* 7.1  ALBUMIN 4.4 3.4*   No results for input(s): LIPASE, AMYLASE in the last 168 hours. No results for input(s): AMMONIA in the last 168 hours. Coagulation Profile: Recent Labs  Lab 01/05/20 1659  INR NOT CALCULATED   Cardiac Enzymes: No results for input(s): CKTOTAL, CKMB, CKMBINDEX, TROPONINI in the last 168 hours. BNP (last 3 results) No results for input(s): PROBNP in the last 8760 hours. HbA1C: Recent Labs    01/05/20 1658  HGBA1C 7.6*   CBG: Recent Labs  Lab 01/06/20 0727 01/06/20 1208 01/06/20 1746 01/06/20 2055 01/07/20 0737  GLUCAP 119* 236* 86 126* 100*   Lipid Profile: No results for input(s): CHOL, HDL, LDLCALC, TRIG, CHOLHDL, LDLDIRECT in the last 72 hours. Thyroid Function Tests: No results for input(s): TSH, T4TOTAL, FREET4, T3FREE,  THYROIDAB in the last 72 hours. Anemia Panel: No results for input(s): VITAMINB12, FOLATE, FERRITIN, TIBC, IRON, RETICCTPCT in the last 72 hours. Sepsis Labs: Recent Labs  Lab 01/05/20 1658 01/06/20 1226 01/07/20 0402  PROCALCITON  --  <0.10 <0.10  LATICACIDVEN 1.1  --   --     Recent Results (from the past 240 hour(s))  Blood Culture (routine x 2)     Status: None (Preliminary result)   Collection Time: 01/05/20  4:25 PM   Specimen: BLOOD  Result Value Ref Range Status   Specimen Description   Final    BLOOD RIGHT ANTECUBITAL Performed at [facility] [address] [place], [place] [address]    Special Requests   Final    BOTTLES DRAWN AEROBIC AND ANAEROBIC Blood Culture adequate volume Performed at [facility] [address], [place], [place] [address]    Culture   Final    NO GROWTH < 12 HOURS Performed at [facility] [address] [place], [place] [address]    Report Status PENDING  Incomplete  Urine culture     Status: Abnormal   Collection Time: 01/05/20  4:25 PM   Specimen: In/Out Cath Urine  Result Value Ref Range Status   Specimen Description   Final    IN/OUT CATH URINE Performed at [facility] [address], [place], [place] [address]    Special Requests   Final    NONE Performed at [facility], [place] [address], [place], [place] [address]    Culture MULTIPLE SPECIES PRESENT, SUGGEST RECOLLECTION (A)  Final   Report Status 01/07/2020 FINAL  Final  Blood Culture (routine x 2)     Status: None (Preliminary result)   Collection Time: 01/05/20  4:30 PM   Specimen: BLOOD  Result Value Ref Range Status   Specimen Description   Final    BLOOD RIGHT WRIST Performed at [facility] [address], [place], [place] [address]    Special Requests   Final    BOTTLES DRAWN AEROBIC AND ANAEROBIC Blood Culture adequate volume Performed at [facility] [address] [place], [place] [address]    Culture   Final    NO GROWTH < 12 HOURS Performed at [facility] [address], [place], [place] [address]    Report Status PENDING  Incomplete  Expectorated  sputum assessment w rflx to resp cult     Status: None   Collection Time: 01/06/20 12:21 PM   Specimen: Sputum  Result Value Ref Range Status   Specimen Description SPUTUM  Final   Special Requests NONE  Final   Sputum evaluation   Final    Sputum specimen not acceptable for testing.  Please recollect.   SPOKE WITH ROGERS RN [phone] 01/06/20 FOR A RECOLLECT JM Performed at [facility], [place] [address], [place], [place] [address]    Report Status 01/06/2020 FINAL  Final  SARS Coronavirus 2 by RT PCR (hospital order, performed in [facility] hospital lab) Nasopharyngeal Nasopharyngeal Swab     Status: None   Collection Time: 01/06/20 12:32 PM   Specimen: Nasopharyngeal Swab  Result Value Ref Range Status   SARS Coronavirus 2 NEGATIVE NEGATIVE Final    Comment: (NOTE) SARS-CoV-2 target nucleic acids are NOT DETECTED. The SARS-CoV-2 RNA is generally detectable in upper and lower respiratory specimens during the acute phase of infection. The lowest concentration of SARS-CoV-2 viral copies this assay can detect is 250 copies / mL. A negative result does not preclude SARS-CoV-2 infection and should not be used as the sole basis for treatment or other patient management decisions.  A negative result may occur with improper specimen collection / handling, submission of specimen other than nasopharyngeal swab, presence of viral mutation(s) within the areas targeted by this assay, and inadequate number of viral copies (<250 copies / mL). A negative result must be combined with clinical observations, patient history, and epidemiological information. Fact Sheet for Patients:   StrictlyIdeas.no Fact Sheet for Healthcare  Providers: BankingDealers.co.za This test is not yet approved or cleared  by the [place] FDA and has been authorized for detection and/or diagnosis of SARS-CoV-2 by FDA under an Emergency Use Authorization (EUA).  This EUA will remain in effect (meaning this test can be used) for the duration of the COVID-19 declaration under Section 564(b)(1) of the Act, 21 U.S.C. section 360bbb-3(b)(1), unless the authorization is terminated or revoked sooner. Performed at [facility], [place] [address] [place], [place] [address]          Radiology Studies: DG CHEST PORT 1 VIEW  Result Date: 01/06/2020 CLINICAL DATA:  [facility] due to productive cough and fever at home. She states that she had been feeling ill with associated body aches as well since Wednesday with continued progression into Friday. H/o CAD, Type II Diabetes. Former smoker. EXAM: PORTABLE CHEST 1 VIEW COMPARISON:  01/05/2020.  11/08/2017. FINDINGS: Left upper lobe opacity, extending from the left superior hilum, has mildly increased in size. Remainder of the lungs is clear. No pleural effusion or pneumothorax. Stable changes from previous cardiac surgery. Cardiac silhouette is normal in size. No mediastinal or hilar masses. Skeletal structures are grossly intact. IMPRESSION: 1. Left upper lobe pneumonia, increased when compared to the previous day's chest radiograph. Electronically Signed   By: Lajean Manes M.D.   On: 01/06/2020 16:18   CT Renal Stone Study  Result Date: 01/05/2020 CLINICAL DATA:  Hematuria and flank pain, initial encounter EXAM: CT ABDOMEN AND PELVIS WITHOUT CONTRAST TECHNIQUE: Multidetector CT imaging of the abdomen and pelvis was performed following the standard protocol without IV contrast. COMPARISON:  01/16/2019 FINDINGS: Lower chest: No acute abnormality. Hepatobiliary: No focal liver abnormality is seen. No gallstones, gallbladder wall thickening, or  biliary dilatation. Pancreas: Unremarkable. No pancreatic ductal dilatation or surrounding inflammatory changes. Spleen:  Normal in size without focal abnormality. Adrenals/Urinary Tract: Adrenal glands are within normal limits. Kidneys demonstrate tiny nonobstructing renal calculi bilaterally. The bladder is partially distended. No ureteral stones are seen. Stomach/Bowel: No obstructive or inflammatory changes of colon are noted. The appendix is within normal limits. No obstructive changes of the small bowel are noted. The stomach is decompressed. Vascular/Lymphatic: Aortic atherosclerosis. No enlarged abdominal or pelvic lymph nodes. Reproductive: Status post hysterectomy. No adnexal masses. Other: No abdominal wall hernia or abnormality. No abdominopelvic ascites. Musculoskeletal: No acute or significant osseous findings. IMPRESSION: Tiny nonobstructing renal calculi bilaterally. No acute abnormality noted. Electronically Signed   By: Inez Catalina M.D.   On: 01/05/2020 17:49        Scheduled Meds: . aspirin EC  81 mg Oral Daily  . atorvastatin  80 mg Oral Daily  . carvedilol  12.5 mg Oral BID  . clopidogrel  75 mg Oral Daily  . enoxaparin (LOVENOX) injection  40 mg Subcutaneous QHS  . escitalopram  10 mg Oral QHS  . ezetimibe  10 mg Oral QHS  . insulin aspart  0-9 Units Subcutaneous TID WC  . ipratropium-albuterol  3 mL Nebulization BID  . isosorbide mononitrate  120 mg Oral Daily  . levETIRAcetam  500 mg Oral BID  . lisinopril  20 mg Oral Daily  . pantoprazole  40 mg Oral Daily  . topiramate  75 mg Oral BID   Continuous Infusions: . sodium chloride Stopped (01/05/20 2334)  . lactated ringers 75 mL/hr at 01/07/20 0631  . levofloxacin (LEVAQUIN) IV 750 mg (01/06/20 1659)     LOS: 2 days    Time spent:40 min    Flora Parks, Geraldo Docker, MD [facility] Pager [phone]  If 7PM-7AM, please contact night-coverage [email or web address] Password [facility] 01/07/2020, 8:35 AM

## 2020-01-08 LAB — CBC WITH DIFFERENTIAL/PLATELET
Abs Immature Granulocytes: 0 10*3/uL (ref 0.00–0.07)
Basophils Absolute: 0 10*3/uL (ref 0.0–0.1)
Basophils Relative: 1 %
Eosinophils Absolute: 0.1 10*3/uL (ref 0.0–0.5)
Eosinophils Relative: 2 %
HCT: 33.4 % — ABNORMAL LOW (ref 36.0–46.0)
Hemoglobin: 10.7 g/dL — ABNORMAL LOW (ref 12.0–15.0)
Immature Granulocytes: 0 %
Lymphocytes Relative: 40 %
Lymphs Abs: 2 10*3/uL (ref 0.7–4.0)
MCH: 31.1 pg (ref 26.0–34.0)
MCHC: 32 g/dL (ref 30.0–36.0)
MCV: 97.1 fL (ref 80.0–100.0)
Monocytes Absolute: 0.7 10*3/uL (ref 0.1–1.0)
Monocytes Relative: 13 %
Neutro Abs: 2.2 10*3/uL (ref 1.7–7.7)
Neutrophils Relative %: 44 %
Platelets: 146 10*3/uL — ABNORMAL LOW (ref 150–400)
RBC: 3.44 MIL/uL — ABNORMAL LOW (ref 3.87–5.11)
RDW: 12.7 % (ref 11.5–15.5)
WBC: 5 10*3/uL (ref 4.0–10.5)
nRBC: 0 % (ref 0.0–0.2)

## 2020-01-08 LAB — COMPREHENSIVE METABOLIC PANEL
ALT: 59 U/L — ABNORMAL HIGH (ref 0–44)
AST: 56 U/L — ABNORMAL HIGH (ref 15–41)
Albumin: 3.1 g/dL — ABNORMAL LOW (ref 3.5–5.0)
Alkaline Phosphatase: 65 U/L (ref 38–126)
Anion gap: 10 (ref 5–15)
BUN: 11 mg/dL (ref 8–23)
CO2: 19 mmol/L — ABNORMAL LOW (ref 22–32)
Calcium: 8.6 mg/dL — ABNORMAL LOW (ref 8.9–10.3)
Chloride: 109 mmol/L (ref 98–111)
Creatinine, Ser: 0.78 mg/dL (ref 0.44–1.00)
GFR calc Af Amer: >60 mL/min (ref >60)
GFR calc non Af Amer: >60 mL/min (ref >60)
Glucose, Bld: 93 mg/dL (ref 70–99)
Potassium: 3.9 mmol/L (ref 3.5–5.1)
Sodium: 138 mmol/L (ref 135–145)
Total Bilirubin: 0.6 mg/dL (ref 0.3–1.2)
Total Protein: 6.5 g/dL (ref 6.5–8.1)

## 2020-01-08 LAB — PHOSPHORUS: Phosphorus: 3.8 mg/dL (ref 2.5–4.6)

## 2020-01-08 LAB — GLUCOSE, CAPILLARY
Glucose-Capillary: 100 mg/dL — ABNORMAL HIGH (ref 70–99)
Glucose-Capillary: 118 mg/dL — ABNORMAL HIGH (ref 70–99)
Glucose-Capillary: 106 mg/dL — ABNORMAL HIGH (ref 70–99)
Glucose-Capillary: 171 mg/dL — ABNORMAL HIGH (ref 70–99)

## 2020-01-08 LAB — MAGNESIUM: Magnesium: 1.9 mg/dL (ref 1.7–2.4)

## 2020-01-08 LAB — PROCALCITONIN: Procalcitonin: <0.1 ng/mL

## 2020-01-08 MED ORDER — PNEUMOCOCCAL VAC POLYVALENT 25 MCG/0.5ML IJ INJ
0.5000 mL | INJECTION | INTRAMUSCULAR | Status: AC
  Administered 2020-01-09: 0.5 mL via INTRAMUSCULAR
  Filled 2020-01-08: qty 0.5

## 2020-01-08 MED ORDER — METFORMIN HCL ER 500 MG PO TB24
500.0000 mg | ORAL_TABLET | Freq: Every day | ORAL | Status: DC
  Administered 2020-01-09: 500 mg via ORAL
  Filled 2020-01-08: qty 1

## 2020-01-08 MED ORDER — LEVOFLOXACIN 500 MG PO TABS
750.0000 mg | ORAL_TABLET | Freq: Every day | ORAL | Status: DC
  Administered 2020-01-08: 750 mg via ORAL
  Filled 2020-01-08: qty 2

## 2020-01-08 NOTE — Progress Notes (Signed)
RT Note: Pt. seen for CPAP H/S order, upon entering room pt. asked if she was ready for CPAP to be placed on  and she informed me that she did not tolerate it well last nite and that she uses Nasal Pillows at home and that our Small Nasal Mask was not tolerable, pt. made aware to notify if needed, currently on room air, n/c @ bedside, pt. declined placement, RN made aware.

## 2020-01-08 NOTE — Evaluation (Signed)
Occupational Therapy Evaluation Patient Details Name: Samantha Huffman MRN: [id number] DOB: 28-Sep-1955 Today's Date: 01/08/2020    History of Present Illness 64 yo female admitted with Pna, sepsis. Hx of DM, Sz, CAD, CABG, memory deficit   Clinical Impression   Patient with functional deficits listed below impacting safety and independence with self care. Patient supervision level for functional transfer to recliner for safety, limited session due to arrival of breakfast. Patient feels her main limitation right now is lack of energy. Will continue to follow.    Follow Up Recommendations  Home health OT;Supervision - Intermittent    Equipment Recommendations  None recommended by OT       Precautions / Restrictions Precautions Precautions: Fall Restrictions Weight Bearing Restrictions: No      Mobility Bed Mobility Overal bed mobility: Needs Assistance Bed Mobility: Supine to Sit     Supine to sit: Supervision Sit to supine: Supervision      Transfers Overall transfer level: Needs assistance Equipment used: None Transfers: Sit to/from Stand Sit to Stand: Supervision         General transfer comment: S for safety    Balance Overall balance assessment: Mild deficits observed, not formally tested           Standing balance-Leahy Scale: Fair                             ADL either performed or assessed with clinical judgement   ADL Overall ADL's : Needs assistance/impaired Eating/Feeding: Independent;Sitting   Grooming: Set up;Sitting   Upper Body Bathing: Set up;Sitting   Lower Body Bathing: Supervison/ safety;Sitting/lateral leans;Sit to/from stand   Upper Body Dressing : Set up;Sitting   Lower Body Dressing: Supervision/safety;Sitting/lateral leans;Sit to/from stand Lower Body Dressing Details (indicate cue type and reason): pt able to pull up socks seated EOB Toilet Transfer: Supervision/safety;Ambulation;BSC Toilet Transfer Details  (indicate cue type and reason): simulated transfer to recliner, supervision for safety Toileting- Clothing Manipulation and Hygiene: Supervision/safety;Sit to/from stand;Sitting/lateral lean       Functional mobility during ADLs: Supervision/safety General ADL Comments: patient with decreased activity tolerance impacting ability to participate in self care                  Pertinent Vitals/Pain Pain Assessment: No/denies pain     Hand Dominance Right   Extremity/Trunk Assessment Upper Extremity Assessment Upper Extremity Assessment: Generalized weakness   Lower Extremity Assessment Lower Extremity Assessment: Defer to PT evaluation   Cervical / Trunk Assessment Cervical / Trunk Assessment: Normal   Communication Communication Communication: No difficulties   Cognition Arousal/Alertness: Awake/alert Behavior During Therapy: WFL for tasks assessed/performed Overall Cognitive Status: Within Functional Limits for tasks assessed                                 General Comments: decreased safety awareness-poor insight into need for bed/chair alarm and/or calling for assistance   General Comments  VSS            Home Living Family/patient expects to be discharged to:: Private residence Living Arrangements: Alone Available Help at Discharge: Family;Available 24 hours/day Type of Home: House Home Access: Level entry     Home Layout: One level     Bathroom Shower/Tub: Teacher, early years/pre: Handicapped height     Home Equipment: Grab bars - tub/shower;Shower seat   Additional Comments: reports kids  can assist with IADLs such as groceries, laundry, meal prep       Prior Functioning/Environment Level of Independence: Independent                 OT Problem List: Decreased activity tolerance;Impaired balance (sitting and/or standing);Decreased safety awareness      OT Treatment/Interventions: Self-care/ADL  training;Therapeutic exercise;Energy conservation;Therapeutic activities;Patient/family education;Balance training;DME and/or AE instruction    OT Goals(Current goals can be found in the care plan section) Acute Rehab OT Goals Patient Stated Goal: home OT Goal Formulation: With patient Time For Goal Achievement: 01/22/20 Potential to Achieve Goals: Good  OT Frequency: Min 2X/week    AM-PAC OT "6 Clicks" Daily Activity     Outcome Measure Help from another person eating meals?: None Help from another person taking care of personal grooming?: A Little Help from another person toileting, which includes using toliet, bedpan, or urinal?: A Little Help from another person bathing (including washing, rinsing, drying)?: A Little Help from another person to put on and taking off regular upper body clothing?: A Little Help from another person to put on and taking off regular lower body clothing?: A Little 6 Click Score: 19   End of Session    Activity Tolerance: Patient tolerated treatment well Patient left: in chair;with call bell/phone within reach  OT Visit Diagnosis: Other abnormalities of gait and mobility (R26.89);Muscle weakness (generalized) (M62.81)                Time: [id number] OT Time Calculation (min): 13 min Charges:  OT General Charges $OT Visit: 1 Visit OT Evaluation $OT Eval Low Complexity: Reserve OT Pager: [place] 01/08/2020, 12:00 PM

## 2020-01-08 NOTE — Evaluation (Signed)
Physical Therapy Evaluation Patient Details Name: Samantha Huffman MRN: [id number] DOB: Oct 21, 1955 Today's Date: 01/08/2020   History of Present Illness  63 yo female admitted with Pna, sepsis. Hx of DM, Sz, CAD, CABG, memory deficit  Clinical Impression  On eval, pt was Min guard-Min assist for mobility. She walked ~110 feet. Min guard with support of IV pole. Min assist when walking a short distance without an assistive device. Fall risk when mobilizing. Poor insight into need for bed/chair alarm and/or calling for assistance. O2 98% on RA, dyspnea 2/4. Pt participated well. Discussed d/c plan-pt stated she is considering going to her daughter's home. Will continue to follow during hospital stay.    Follow Up Recommendations Home health PT;Supervision OOB/mobility    Equipment Recommendations  Rolling walker with 5" wheels    Recommendations for Other Services       Precautions / Restrictions Precautions Precautions: Fall Restrictions Weight Bearing Restrictions: No      Mobility  Bed Mobility Overal bed mobility: Needs Assistance Bed Mobility: Supine to Sit;Sit to Supine     Supine to sit: Supervision Sit to supine: Supervision      Transfers Overall transfer level: Needs assistance   Transfers: Sit to/from Stand Sit to Stand: Min guard         General transfer comment: min guard for safety. unsteady.  Ambulation/Gait Ambulation/Gait assistance: Min assist;Min guard Gait Distance (Feet): 110 Feet Assistive device: IV Pole;None Gait Pattern/deviations: Step-through pattern;Decreased stride length     General Gait Details: walked x 1, ~100 feet, IV pole-min guard assist. walked x 1, ~10 feet, no device-min assist to steady.  Stairs            Wheelchair Mobility    Modified Rankin (Stroke Patients Only)       Balance Overall balance assessment: Needs assistance           Standing balance-Leahy Scale: Fair                               Pertinent Vitals/Pain Pain Assessment: No/denies pain    Home Living Family/patient expects to be discharged to:: Private residence Living Arrangements: Alone                    Prior Function Level of Independence: Independent               Hand Dominance        Extremity/Trunk Assessment   Upper Extremity Assessment Upper Extremity Assessment: Defer to OT evaluation    Lower Extremity Assessment Lower Extremity Assessment: Generalized weakness    Cervical / Trunk Assessment Cervical / Trunk Assessment: Normal  Communication   Communication: No difficulties  Cognition Arousal/Alertness: Awake/alert Behavior During Therapy: WFL for tasks assessed/performed Overall Cognitive Status: Within Functional Limits for tasks assessed                                 General Comments: decreased safety awareness-poor insight into need for bed/chair alarm and/or calling for assistance      General Comments      Exercises     Assessment/Plan    PT Assessment Patient needs continued PT services  PT Problem List Decreased mobility;Decreased strength;Decreased balance;Decreased activity tolerance       PT Treatment Interventions DME instruction;Gait training;Therapeutic activities;Therapeutic exercise;Patient/family education;Balance training;Functional mobility training    PT Goals (  Current goals can be found in the Care Plan section)  Acute Rehab PT Goals Patient Stated Goal: home PT Goal Formulation: With patient Time For Goal Achievement: 01/22/20 Potential to Achieve Goals: Good    Frequency Min 3X/week   Barriers to discharge Decreased caregiver support      Co-evaluation               AM-PAC PT "6 Clicks" Mobility  Outcome Measure Help needed turning from your back to your side while in a flat bed without using bedrails?: A Little Help needed moving from lying on your back to sitting on the side of a flat bed without  using bedrails?: A Little Help needed moving to and from a bed to a chair (including a wheelchair)?: A Little Help needed standing up from a chair using your arms (e.g., wheelchair or bedside chair)?: A Little Help needed to walk in hospital room?: A Little Help needed climbing 3-5 steps with a railing? : A Little 6 Click Score: 18    End of Session Equipment Utilized During Treatment: Gait belt Activity Tolerance: Patient tolerated treatment well Patient left: in bed;with call bell/phone within reach;with bed alarm set   PT Visit Diagnosis: Unsteadiness on feet (R26.81);Muscle weakness (generalized) (M62.81)    Time: [id number] PT Time Calculation (min) (ACUTE ONLY): 11 min   Charges:   PT Evaluation $PT Eval Low Complexity: 1 Low            Cori Justus P, PT Acute Rehabilitation

## 2020-01-08 NOTE — Progress Notes (Signed)
Pharmacy Antibiotic Note  Samantha Huffman is a 64 y.o. female admitted on 01/05/2020 with pneumonia.  Pharmacy has been consulted for levofloxacin dosing.  Pt has PCN allergy (hives), no history of cephalosporin use.  Today, 01/08/20  Day#4 Levaquin -WBC WNL, Afebrile -Scr WNL -Cx data remains negative -PCT<0.1  Plan:  Change Levofloxacin 750 mg PO q24h  Recommend 5 days abx for PNA  Height: 5\' 1"  (154.9 cm) Weight: 65.3 kg (144 lb) IBW/kg (Calculated) : 47.8  Temp (24hrs), Avg:98.8 F (37.1 C), Min:97.4 F (36.3 C), Max:100.2 F (37.9 C)  Recent Labs  Lab 01/05/20 1658 01/05/20 1659 01/06/20 0500 01/07/20 0402 01/08/20 0439  WBC  --  10.4 6.9 5.2 5.0  CREATININE  --  1.02* 0.92 0.86 0.78  LATICACIDVEN 1.1  --   --   --   --     Estimated Creatinine Clearance: 62.3 mL/min (by C-G formula based on SCr of 0.78 mg/dL).    Allergies  Allergen Reactions  . Depakote [Divalproex Sodium] Other (See Comments)    Hyperammonemia  . Penicillins Hives    Other reaction(s): GI intolerance Has patient had a PCN reaction causing immediate rash, facial/tongue/throat swelling, SOB or lightheadedness with hypotension: YES Has patient had a PCN reaction causing severe rash involving mucus membranes or skin necrosis: NO Has patient had a PCN reaction that required hospitalizationNO Has patient had a PCN reaction occurring within the last 10 years: NO If all of the above answers are "NO", then may proceed with Cephalosporin use.    Antimicrobials this admission: levofloxacin 5/15 >>   Dose adjustments this admission:  Microbiology results: 5/15 BCx: NGTD 5/15 UCx: multiple, recollect   Thank you for allowing pharmacy to be a part of this patient's care.  Netta Cedars, PharmD 01/08/2020 10:33 AM

## 2020-01-08 NOTE — Progress Notes (Signed)
PROGRESS NOTE    Samantha Huffman  [id number] DOB: 01/22/1956 DOA: 01/05/2020 PCP: Alroy Dust, L.Marlou Sa, MD     Brief Narrative:  64 y.o. DF PMHx  CAD status post CABG in 2014, HTN, HLD, nephrolithiasis, DM type II uncontrolled with complication,   presents to [facility] due to productive cough and fever at home.  She states that she had been feeling ill with associated body aches as well since Wednesday with continued progression into Friday.  She states that due to this she then presented to urgent care where she had a chest x-ray revealing left upper lobe pneumonia.  She also had a negative Covid PCR and negative influenza test at that time as well.  She denies any associated shortness of breath despite her productive cough, fever, and pneumonia noted on x-ray.  She does admit to dyspnea on exertion however.  She also admits to intermittent hematuria and has a history of kidney stones.  She denies any chest pain, abdominal pain, dysuria, headache, weakness.  ED Course: In the ED she received IV fluid and IV antibiotics in the form of Levaquin.  She also underwent a CT kidney stone protocol given her history of kidney stones with hematuria and fever.  This revealed to bilateral small nonobstructing stones with no signs of hydronephrosis or pyelonephritis.  Patient will be admitted with inpatient status for pneumonia.  Review of Systems: As per HPI otherwise 10 point review of systems negative.    Subjective: 5/18, afebrile overnight A/O x4, not negative S OB, negative DOE.  Negative CP.  Patient feels much better.    Assessment & Plan:   Active Problems:   Generalized convulsive epilepsy ([place])   Diabetes mellitus without complication ([place])   Dyslipidemia   Seizures (HCC)   CAD (coronary artery disease)   Essential hypertension   OSA (obstructive sleep apnea)   Nephrolithiasis   Dyspnea on exertion   Pneumonia   Lobar pneumonia (HCC)   Sepsis due to pneumonia ([place])  Depression  Sepsis/Left Upper Lobe Pneumonia -On admission fever> 38 C, HR> 90.  CXR positive for opacification -Patient not evaluated for coronavirus. -5/16 coronavirus negative -DuoNeb nebulizer r QID -Flutter valve -Incentive spirometry -Titrate O2 to maintain SPO2> 88% -5/16 PCXR; LUL opacification consistent with pneumonia -5/18 ambulated 360 feet on room air without decrease SPO2. -Does not meet criteria for home O2.  -We will transition patient over to p.o. medications and if she tolerates discharge in a.m.  OSA -CPAP per respiratory  Seizures -Keppra 500 mg BID -Topiramate 75 mg BID  Depression -Lexapro 10 mg daily  Essential HTN/CAD -Coreg 12.5 mg BID -Plavix 75 mg daily -Furosemide 20 mg daily PRN -Imdur 120 mg daily -Lisinopril 20 mg daily -NTG 0.4 mg  HLD -Lipitor 80 mg daily -Zetia 10 mg daily -Hold Alirocumab -Lipid panel pending  GERD -Continue Protonix  Nephrolithiasis -Nonobstructing, small -5/18 discontinue all IV fluids.  DM type II uncontrolled with complication -123456 hemoglobin A1c= 7.6 -5/18 Glucophage XR 500 mg daily -Sensitive SSI   Goals of care -5/17 PT/OT consult; patient with LUL pneumonia extremely weak lives alone most likely will not be able to perform ADLs for herself.  Evaluate for CIR vs SNF   DVT prophylaxis: Lovenox Code Status: Full Family Communication: 5/17 spoke with [name] (daughter) discussed plan of care answered all questions Disposition Plan: TBD Status is: Inpatient    Dispo: The patient is from: Home  Anticipated d/c is to: Home              Anticipated d/c date is: 21 May              Patient currently unstable      Consultants:    Procedures/Significant Events:  5/16 PCXR; Left upper lobe pneumonia, increased when compared to the previous day's chest radiograph.    I have personally reviewed and interpreted all radiology studies and my findings are as above.  VENTILATOR  SETTINGS:    Cultures 5/15 blood RIGHT AC NGTD  5/15 blood RIGHT wrist NGTD 5/15 urine positive multiple species 5/16 sputum not acceptable for testing 5/16 SARS coronavirus negative   Antimicrobials: Anti-infectives (From admission, onward)   Start     Dose/Rate Stop   01/08/20 1600  levofloxacin (LEVAQUIN) tablet 750 mg     750 mg 01/10/20 1559   01/06/20 1700  levofloxacin (LEVAQUIN) IVPB 750 mg  Status:  Discontinued     750 mg 100 mL/hr over 90 Minutes 01/08/20 1038   01/05/20 1645  levofloxacin (LEVAQUIN) IVPB 750 mg     750 mg 100 mL/hr over 90 Minutes 01/05/20 1914       Devices    LINES / TUBES:      Continuous Infusions: . lactated ringers 75 mL/hr at 01/07/20 2149     Objective: Vitals:   01/07/20 2003 01/07/20 2056 01/08/20 0534 01/08/20 1144  BP:  (!) 160/79 (!) 159/95   Pulse:  72 69 63  Resp:  16 17 16   Temp:  100.2 F (37.9 C) 98.7 F (37.1 C)   TempSrc:  Oral Oral   SpO2: 97% 100% 100% 97%  Weight:      Height:        Intake/Output Summary (Last 24 hours) at 01/08/2020 1426 Last data filed at 01/08/2020 [id number] Gross per 24 hour  Intake 1846.12 ml  Output 0 ml  Net 1846.12 ml   Filed Weights   01/06/20 1333  Weight: 65.3 kg   Physical Exam:  General: A/O x4, no acute respiratory distress Eyes: negative scleral hemorrhage, negative anisocoria, negative icterus ENT: Negative Runny nose, negative gingival bleeding, Neck:  Negative scars, masses, torticollis, lymphadenopathy, JVD Lungs: Clear to auscultation bilaterally without wheezes or crackles Cardiovascular: Regular rate and rhythm without murmur gallop or rub normal S1 and S2 Abdomen: negative abdominal pain, nondistended, positive soft, bowel sounds, no rebound, no ascites, no appreciable mass Extremities: No significant cyanosis, clubbing, or edema bilateral lower extremities Skin: Negative rashes, lesions, ulcers Psychiatric:  Negative depression, negative anxiety, negative  fatigue, negative mania  Central nervous system:  Cranial nerves II through XII intact, tongue/uvula midline, all extremities muscle strength 5/5, sensation intact throughout, negative dysarthria, negative expressive aphasia, negative receptive aphasia. .     Data Reviewed: Care during the described time interval was provided by me .  I have reviewed this patient's available data, including medical history, events of note, physical examination, and all test results as part of my evaluation.  CBC: Recent Labs  Lab 01/05/20 1659 01/06/20 0500 01/07/20 0402 01/08/20 0439  WBC 10.4 6.9 5.2 5.0  NEUTROABS 7.3 4.6 2.9 2.2  HGB 14.1 12.2 11.1* 10.7*  HCT 42.7 37.8 34.8* 33.4*  MCV 96.6 96.2 98.6 97.1  PLT 175 149* 140* 123456*   Basic Metabolic Panel: Recent Labs  Lab 01/05/20 1659 01/06/20 0500 01/07/20 0402 01/08/20 0439  NA 135 138 138 138  K 3.9 4.1 4.0 3.9  CL 104 111 110 109  CO2 20* 19* 18* 19*  GLUCOSE 131* 131* 90 93  BUN 12 12 11 11   CREATININE 1.02* 0.92 0.86 0.78  CALCIUM 9.4 9.2 8.6* 8.6*  MG  --   --  1.9 1.9  PHOS  --   --  3.0 3.8   GFR: Estimated Creatinine Clearance: 62.3 mL/min (by C-G formula based on SCr of 0.78 mg/dL). Liver Function Tests: Recent Labs  Lab 01/05/20 1659 01/06/20 0500 01/07/20 0402 01/08/20 0439  AST 28 29 41 56*  ALT 22 27 38 59*  ALKPHOS 88 70 66 65  BILITOT 0.8 0.8 0.4 0.6  PROT 8.6* 7.1 6.5 6.5  ALBUMIN 4.4 3.4* 3.0* 3.1*   No results for input(s): LIPASE, AMYLASE in the last 168 hours. No results for input(s): AMMONIA in the last 168 hours. Coagulation Profile: Recent Labs  Lab 01/05/20 1659  INR NOT CALCULATED   Cardiac Enzymes: No results for input(s): CKTOTAL, CKMB, CKMBINDEX, TROPONINI in the last 168 hours. BNP (last 3 results) No results for input(s): PROBNP in the last 8760 hours. HbA1C: Recent Labs    01/05/20 1658  HGBA1C 7.6*   CBG: Recent Labs  Lab 01/07/20 1139 01/07/20 1636 01/07/20 2024  01/08/20 0722 01/08/20 1158  GLUCAP 132* 101* 90 100* 118*   Lipid Profile: No results for input(s): CHOL, HDL, LDLCALC, TRIG, CHOLHDL, LDLDIRECT in the last 72 hours. Thyroid Function Tests: No results for input(s): TSH, T4TOTAL, FREET4, T3FREE, THYROIDAB in the last 72 hours. Anemia Panel: No results for input(s): VITAMINB12, FOLATE, FERRITIN, TIBC, IRON, RETICCTPCT in the last 72 hours. Sepsis Labs: Recent Labs  Lab 01/05/20 1658 01/06/20 1226 01/07/20 0402 01/08/20 0439  PROCALCITON  --  <0.10 <0.10 <0.10  LATICACIDVEN 1.1  --   --   --     Recent Results (from the past 240 hour(s))  Blood Culture (routine x 2)     Status: None (Preliminary result)   Collection Time: 01/05/20  4:25 PM   Specimen: BLOOD  Result Value Ref Range Status   Specimen Description   Final    BLOOD RIGHT ANTECUBITAL Performed at [facility] [address], [place], [place] [address]    Special Requests   Final    BOTTLES DRAWN AEROBIC AND ANAEROBIC Blood Culture adequate volume Performed at [facility] [address], [place], [place] [address]    Culture   Final    NO GROWTH 3 DAYS Performed at [facility], [place] [address], [place], [place] [address]    Report Status PENDING  Incomplete  Urine culture     Status: Abnormal   Collection Time: 01/05/20  4:25 PM   Specimen: In/Out Cath Urine  Result Value Ref Range Status   Specimen Description   Final    IN/OUT CATH URINE Performed at [facility] [address], [place], [place] [address]    Special Requests   Final    NONE Performed at [facility], [place] [address] [place], [place] [address]    Culture MULTIPLE SPECIES PRESENT, SUGGEST RECOLLECTION (A)  Final   Report Status 01/07/2020 FINAL  Final  Blood Culture (routine x 2)     Status: None (Preliminary result)   Collection Time: 01/05/20  4:30 PM   Specimen: BLOOD  Result Value Ref Range  Status   Specimen Description   Final    BLOOD RIGHT WRIST Performed at [facility],  [place] [address] [place], [place] [address]    Special Requests   Final    BOTTLES DRAWN AEROBIC AND ANAEROBIC Blood Culture adequate volume Performed at [facility] [address] [place], [place] [address]    Culture   Final    NO GROWTH 3 DAYS Performed at [facility], [place] [address], [place], [place] [address]    Report Status PENDING  Incomplete  Expectorated sputum assessment w rflx to resp cult     Status: None   Collection Time: 01/06/20 12:21 PM   Specimen: Sputum  Result Value Ref Range Status   Specimen Description SPUTUM  Final   Special Requests NONE  Final   Sputum evaluation   Final    Sputum specimen not acceptable for testing.  Please recollect.   SPOKE WITH ROGERS RN [phone] 01/06/20 FOR A RECOLLECT JM Performed at [facility], [place] [address], [place], [place] [address]    Report Status 01/06/2020 FINAL  Final  SARS Coronavirus 2 by RT PCR (hospital order, performed in [facility] hospital lab) Nasopharyngeal Nasopharyngeal Swab     Status: None   Collection Time: 01/06/20 12:32 PM   Specimen: Nasopharyngeal Swab  Result Value Ref Range Status   SARS Coronavirus 2 NEGATIVE NEGATIVE Final    Comment: (NOTE) SARS-CoV-2 target nucleic acids are NOT DETECTED. The SARS-CoV-2 RNA is generally detectable in upper and lower respiratory specimens during the acute phase of infection. The lowest concentration of SARS-CoV-2 viral copies this assay can detect is 250 copies / mL. A negative result does not preclude SARS-CoV-2 infection and should not be used as the sole basis for treatment or other patient management decisions.  A negative result may occur with improper specimen collection / handling, submission of specimen other than nasopharyngeal swab, presence of viral mutation(s) within the areas targeted by this assay,  and inadequate number of viral copies (<250 copies / mL). A negative result must be combined with clinical observations, patient history, and epidemiological information. Fact Sheet for Patients:   StrictlyIdeas.no Fact Sheet for Healthcare Providers: BankingDealers.co.za This test is not yet approved or cleared  by the [place] FDA and has been authorized for detection and/or diagnosis of SARS-CoV-2 by FDA under an Emergency Use Authorization (EUA).  This EUA will remain in effect (meaning this test can be used) for the duration of the COVID-19 declaration under Section 564(b)(1) of the Act, 21 U.S.C. section 360bbb-3(b)(1), unless the authorization is terminated or revoked sooner. Performed at [facility], [place] [address] [place], [place] [address]          Radiology Studies: DG CHEST PORT 1 VIEW  Result Date: 01/06/2020 CLINICAL DATA:  Pt presents to [facility] due to productive cough and fever at home. She states that she had been feeling ill with associated body aches as well since Wednesday with continued progression into Friday. H/o CAD, Type II Diabetes. Former smoker. EXAM: PORTABLE CHEST 1 VIEW COMPARISON:  01/05/2020.  11/08/2017. FINDINGS: Left upper lobe opacity, extending from the left superior hilum, has mildly increased in size. Remainder of the lungs is clear. No pleural effusion or pneumothorax. Stable changes from previous cardiac surgery. Cardiac silhouette is normal in size. No mediastinal or hilar masses. Skeletal structures are grossly intact. IMPRESSION: 1. Left upper lobe pneumonia, increased when compared to the previous day's chest radiograph. Electronically Signed   By: Lajean Manes M.D.   On: 01/06/2020 16:18  Scheduled Meds: . aspirin EC  81 mg Oral Daily  . atorvastatin  80 mg Oral Daily  . carvedilol  12.5 mg Oral BID  . clopidogrel  75 mg Oral Daily  . enoxaparin  (LOVENOX) injection  40 mg Subcutaneous QHS  . escitalopram  10 mg Oral QHS  . ezetimibe  10 mg Oral QHS  . insulin aspart  0-9 Units Subcutaneous TID WC  . isosorbide mononitrate  120 mg Oral Daily  . levETIRAcetam  500 mg Oral BID  . levofloxacin  750 mg Oral q1600  . lisinopril  20 mg Oral Daily  . pantoprazole  40 mg Oral Daily  . topiramate  75 mg Oral BID   Continuous Infusions: . lactated ringers 75 mL/hr at 01/07/20 2149     LOS: 3 days    Time spent:40 min    Panda Crossin, Geraldo Docker, MD [facility] Pager [phone]  If 7PM-7AM, please contact night-coverage [email or web address] Password [email or web address] 01/08/2020, 2:26 PM

## 2020-01-09 LAB — COMPREHENSIVE METABOLIC PANEL
ALT: 72 U/L — ABNORMAL HIGH (ref 0–44)
AST: 57 U/L — ABNORMAL HIGH (ref 15–41)
Albumin: 3.1 g/dL — ABNORMAL LOW (ref 3.5–5.0)
Alkaline Phosphatase: 65 U/L (ref 38–126)
Anion gap: 10 (ref 5–15)
BUN: 11 mg/dL (ref 8–23)
CO2: 18 mmol/L — ABNORMAL LOW (ref 22–32)
Calcium: 9.2 mg/dL (ref 8.9–10.3)
Chloride: 111 mmol/L (ref 98–111)
Creatinine, Ser: 0.76 mg/dL (ref 0.44–1.00)
GFR calc Af Amer: >60 mL/min (ref >60)
GFR calc non Af Amer: >60 mL/min (ref >60)
Glucose, Bld: 107 mg/dL — ABNORMAL HIGH (ref 70–99)
Potassium: 4 mmol/L (ref 3.5–5.1)
Sodium: 139 mmol/L (ref 135–145)
Total Bilirubin: 0.5 mg/dL (ref 0.3–1.2)
Total Protein: 6.6 g/dL (ref 6.5–8.1)

## 2020-01-09 LAB — CBC WITH DIFFERENTIAL/PLATELET
Abs Immature Granulocytes: 0.02 10*3/uL (ref 0.00–0.07)
Basophils Absolute: 0 10*3/uL (ref 0.0–0.1)
Basophils Relative: 0 %
Eosinophils Absolute: 0.1 10*3/uL (ref 0.0–0.5)
Eosinophils Relative: 2 %
HCT: 34.4 % — ABNORMAL LOW (ref 36.0–46.0)
Hemoglobin: 11.1 g/dL — ABNORMAL LOW (ref 12.0–15.0)
Immature Granulocytes: 0 %
Lymphocytes Relative: 35 %
Lymphs Abs: 1.9 10*3/uL (ref 0.7–4.0)
MCH: 31.1 pg (ref 26.0–34.0)
MCHC: 32.3 g/dL (ref 30.0–36.0)
MCV: 96.4 fL (ref 80.0–100.0)
Monocytes Absolute: 0.6 10*3/uL (ref 0.1–1.0)
Monocytes Relative: 11 %
Neutro Abs: 2.7 10*3/uL (ref 1.7–7.7)
Neutrophils Relative %: 52 %
Platelets: 166 10*3/uL (ref 150–400)
RBC: 3.57 MIL/uL — ABNORMAL LOW (ref 3.87–5.11)
RDW: 12.5 % (ref 11.5–15.5)
WBC: 5.3 10*3/uL (ref 4.0–10.5)
nRBC: 0 % (ref 0.0–0.2)

## 2020-01-09 LAB — LIPID PANEL
Cholesterol: 79 mg/dL (ref 0–200)
HDL: 26 mg/dL — ABNORMAL LOW (ref >40)
LDL Cholesterol: 40 mg/dL (ref 0–99)
Total CHOL/HDL Ratio: 3 RATIO
Triglycerides: 64 mg/dL (ref <150)
VLDL: 13 mg/dL (ref 0–40)

## 2020-01-09 LAB — MAGNESIUM: Magnesium: 2 mg/dL (ref 1.7–2.4)

## 2020-01-09 LAB — GLUCOSE, CAPILLARY
Glucose-Capillary: 105 mg/dL — ABNORMAL HIGH (ref 70–99)
Glucose-Capillary: 162 mg/dL — ABNORMAL HIGH (ref 70–99)

## 2020-01-09 LAB — PHOSPHORUS: Phosphorus: 4.4 mg/dL (ref 2.5–4.6)

## 2020-01-09 MED ORDER — LEVOFLOXACIN 750 MG PO TABS: 750.0000 mg | ORAL_TABLET | Freq: Every day | ORAL | 0 refills | Status: AC

## 2020-01-09 NOTE — Discharge Summary (Signed)
Physician Discharge Summary  Kalika Patraw [id number] DOB: Jan 03, 1956 DOA: 01/05/2020  PCP: Alroy Dust, L.Marlou Sa, MD  Admit date: 01/05/2020 Discharge date: 01/09/2020  Admitted From: home Disposition:  home  Recommendations for Outpatient Follow-up:  1. Follow up with PCP in 1-2 weeks 2. Please obtain BMP/CBC in one week 3. Please follow up on the following pending results:  Home Health:yes  Equipment/Devices: none  Discharge Condition: Stable Code Status: Full Diet recommendation:  Diet Order            Diet - low sodium heart healthy        Diet Carb Modified Fluid consistency: Thin; Room service appropriate? Yes  Diet effective now               Brief/Interim Summary:  As per admitting 64 y.o.DF PMHx CAD status post CABG in 2014, HTN, HLD, nephrolithiasis, DM type II uncontrolled with complication, presented to worsening ED with productive cough fever at home.She had been feeling ill with associated body aches as well since Wednesday with continued progression into Friday. She states that due to this she then presented to urgent care where she had a chest x-ray revealing left upper lobe pneumonia. She also had a negative Covid PCR and negative influenza test at that time as well. She denies any associated shortness of breath despite her productive cough, fever, and pneumonia noted on x-ray. She does admit to dyspnea on exertion however. She also admits to intermittent hematuria and has a history of kidney stones. She denies any chest pain, abdominal pain, dysuria, headache, weakness.  ED Course:In the ED she received IV fluid and IV antibiotics in the form of Levaquin. She also underwent a CT kidney stone protocol given her history of kidney stones with hematuria and fever. This revealed to bilateral small nonobstructing stones with no signs of hydronephrosis or pyelonephritis.  Patient is admitted was treated for; Left upper lobe pneumonia with sepsis POA with fever  more than 38, heart rate more than 90 and chest x-ray positive for opacification.  COVID-19 negative, treated with antibiotics and subsequently transitioned to oral Levaquin tolerating well, not hypoxic denies chest pain shortness of breath fever chills today.  Has been ambulating without need for oxygen and feels improved and ready for home today. Are chronic and stable and she will continue on her home medication see below  Discharge Diagnoses:  Pneumonia/sepsis POA: Clinically improved.  Not on oxygen.  No fever no leukocytosis.  Procalcitonin reassuring at less than 0.1 on 5/18.Tolerating oral Levaquin.Will discharge on oral Levaquin course and follow-up with PCP. Lobar pneumonia : Continue antibiotics as above   Other issues included as below and she will continue on her home medications as listed on d/c med list. Generalized convulsive epilepsy ([place]) Diabetes mellitus without complication ([place]) Dyslipidemia Seizures ([place]) CAD (coronary artery disease) Essential hypertension OSA (obstructive sleep apnea) Nephrolithiasis Dyspnea on exertion Depression   Consults:  None  Subjective: Feels well denies cough chest pain shortness of breath fever or chills. Discharge Exam: Vitals:   01/08/20 2218 01/09/20 0500  BP: (!) 148/77 (!) 152/73  Pulse: 64 63  Resp: 18 18  Temp: 98.7 F (37.1 C) 97.6 F (36.4 C)  SpO2: 99% 98%   General: Pt is alert, awake, not in acute distress Cardiovascular: RRR, S1/S2 +, no rubs, no gallops Respiratory: CTA bilaterally, non tender, no wheezing, no rhonchi Abdominal: Soft, NT, ND, bowel sounds + Extremities: no edema, no cyanosis  Discharge Instructions  Discharge Instructions  Diet - low sodium heart healthy   Complete by: As directed    Discharge instructions   Complete by: As directed    Please call call MD or return to ER for similar or worsening recurring problem that brought you to hospital or if any fever,nausea/vomiting,abdominal  pain, uncontrolled pain, chest pain,  shortness of breath or any other alarming symptoms.  Please follow-up your doctor as instructed in a week time and call the office for appointment.  Please avoid alcohol, smoking, or any other illicit substance and maintain healthy habits including taking your regular medications as prescribed.  You were cared for by a hospitalist during your hospital stay. If you have any questions about your discharge medications or the care you received while you were in the hospital after you are discharged, you can call the unit and ask to speak with the hospitalist on call if the hospitalist that took care of you is not available.  Once you are discharged, your primary care physician will handle any further medical issues. Please note that NO REFILLS for any discharge medications will be authorized once you are discharged, as it is imperative that you return to your primary care physician (or establish a relationship with a primary care physician if you do not have one) for your aftercare needs so that they can reassess your need for medications and monitor your lab values   Increase activity slowly   Complete by: As directed      Allergies as of 01/09/2020      Reactions   Depakote [divalproex Sodium] Other (See Comments)   Hyperammonemia   Penicillins Hives   Other reaction(s): GI intolerance Has patient had a PCN reaction causing immediate rash, facial/tongue/throat swelling, SOB or lightheadedness with hypotension: YES Has patient had a PCN reaction causing severe rash involving mucus membranes or skin necrosis: NO Has patient had a PCN reaction that required hospitalizationNO Has patient had a PCN reaction occurring within the last 10 years: NO If all of the above answers are "NO", then may proceed with Cephalosporin use.      Medication List    TAKE these medications   acetaminophen 325 MG tablet Commonly known as: TYLENOL Take 2 tablets (650 mg total) by  mouth every 4 (four) hours as needed for headache or mild pain.   ALPRAZolam 0.25 MG tablet Commonly known as: XANAX Take 0.25 mg by mouth 3 (three) times daily as needed for anxiety.   aspirin EC 81 MG tablet Take 81 mg by mouth daily.   atorvastatin 80 MG tablet Commonly known as: LIPITOR Take 1 tablet (80 mg total) by mouth daily.   carvedilol 12.5 MG tablet Commonly known as: COREG Take 1 tablet (12.5 mg total) by mouth 2 (two) times daily.   clopidogrel 75 MG tablet Commonly known as: PLAVIX Take 1 tablet (75 mg total) by mouth daily.   clotrimazole-betamethasone cream Commonly known as: LOTRISONE Apply 1 application topically daily as needed (skin folds (under breasts)).   escitalopram 10 MG tablet Commonly known as: LEXAPRO Take 10 mg by mouth at bedtime.   ezetimibe 10 MG tablet Commonly known as: ZETIA TAKE 1 TABLET (10 MG TOTAL) BY MOUTH DAILY. What changed: when to take this   furosemide 20 MG tablet Commonly known as: LASIX Take 20 mg by mouth daily as needed (fluid retention/swelling.).   isosorbide mononitrate 120 MG 24 hr tablet Commonly known as: IMDUR Take 1 tablet (120 mg total) by mouth daily.   levETIRAcetam  500 MG tablet Commonly known as: KEPPRA Take 1 tablet (500 mg total) by mouth 2 (two) times daily.   levofloxacin 750 MG tablet Commonly known as: LEVAQUIN Take 1 tablet (750 mg total) by mouth daily at 4 PM for 4 doses.   lisinopril 20 MG tablet Commonly known as: ZESTRIL Take 1 tablet (20 mg total) by mouth daily.   metFORMIN 500 MG 24 hr tablet Commonly known as: GLUCOPHAGE-XR Take 500 mg by mouth daily with breakfast.   nitroGLYCERIN 0.4 MG SL tablet Commonly known as: NITROSTAT Place 1 tablet (0.4 mg total) under the tongue every 5 (five) minutes x 3 doses as needed for chest pain.   pantoprazole 40 MG tablet Commonly known as: PROTONIX TAKE 1 TABLET BY MOUTH EVERY DAY   Potassium Citrate 15 MEQ (1620 MG) Tbcr Take 1  tablet by mouth in the morning and at bedtime.   Praluent 75 MG/ML Soaj Generic drug: Alirocumab Inject 1 pen into the skin every 14 (fourteen) days.   topiramate 25 MG tablet Commonly known as: TOPAMAX Take 3 tablets (75 mg total) by mouth 2 (two) times daily.      Follow-up Information    Alroy Dust, L.Marlou Sa, MD Follow up in 1 week(s).   Specialty: Family Medicine Contact information: [address] [place] [address] [phone]        Sueanne Margarita, MD .   Specialty: Cardiology Contact information: [phone] [place] [address] [phone]          Allergies  Allergen Reactions  . Depakote [Divalproex Sodium] Other (See Comments)    Hyperammonemia  . Penicillins Hives    Other reaction(s): GI intolerance Has patient had a PCN reaction causing immediate rash, facial/tongue/throat swelling, SOB or lightheadedness with hypotension: YES Has patient had a PCN reaction causing severe rash involving mucus membranes or skin necrosis: NO Has patient had a PCN reaction that required hospitalizationNO Has patient had a PCN reaction occurring within the last 10 years: NO If all of the above answers are "NO", then may proceed with Cephalosporin use.    The results of significant diagnostics from this hospitalization (including imaging, microbiology, ancillary and laboratory) are listed below for reference.    Microbiology: Recent Results (from the past 240 hour(s))  Blood Culture (routine x 2)     Status: None (Preliminary result)   Collection Time: 01/05/20  4:25 PM   Specimen: BLOOD  Result Value Ref Range Status   Specimen Description   Final    BLOOD RIGHT ANTECUBITAL Performed at [facility] [address], [place], [place] [address]    Special Requests   Final    BOTTLES DRAWN AEROBIC AND ANAEROBIC Blood Culture adequate volume Performed at [facility] [address]  [place], [place] [address]    Culture   Final    NO GROWTH 4 DAYS Performed at [facility], [place] [address] [place], [place] [address]    Report Status PENDING  Incomplete  Urine culture     Status: Abnormal   Collection Time: 01/05/20  4:25 PM   Specimen: In/Out Cath Urine  Result Value Ref Range Status   Specimen Description   Final    IN/OUT CATH URINE Performed at [facility] [address], [place], [place] [address]    Special Requests   Final    NONE Performed at [facility], [place] [address] [place],  [place] [address]    Culture MULTIPLE SPECIES PRESENT, SUGGEST RECOLLECTION (A)  Final   Report Status 01/07/2020 FINAL  Final  Blood Culture (routine x 2)     Status: None (Preliminary result)   Collection Time: 01/05/20  4:30 PM   Specimen: BLOOD  Result Value Ref Range Status   Specimen Description   Final    BLOOD RIGHT WRIST Performed at [facility] [address], [place], [place] [address]    Special Requests   Final    BOTTLES DRAWN AEROBIC AND ANAEROBIC Blood Culture adequate volume Performed at [facility] [address], [place], [place] [address]    Culture   Final    NO GROWTH 4 DAYS Performed at [facility], [place] [address], [place], [place] [address]    Report Status PENDING  Incomplete  Expectorated sputum assessment w rflx to resp cult     Status: None   Collection Time: 01/06/20 12:21 PM   Specimen: Sputum  Result Value Ref Range Status   Specimen Description SPUTUM  Final   Special Requests NONE  Final   Sputum evaluation   Final    Sputum specimen not acceptable for testing.  Please recollect.   SPOKE WITH ROGERS RN [phone] 01/06/20 FOR A RECOLLECT JM Performed at [facility], [place] [address], [place], [place] [address]    Report Status 01/06/2020 FINAL  Final  SARS Coronavirus 2 by RT PCR (hospital order, performed in [facility] hospital  lab) Nasopharyngeal Nasopharyngeal Swab     Status: None   Collection Time: 01/06/20 12:32 PM   Specimen: Nasopharyngeal Swab  Result Value Ref Range Status   SARS Coronavirus 2 NEGATIVE NEGATIVE Final    Comment: (NOTE) SARS-CoV-2 target nucleic acids are NOT DETECTED. The SARS-CoV-2 RNA is generally detectable in upper and lower respiratory specimens during the acute phase of infection. The lowest concentration of SARS-CoV-2 viral copies this assay can detect is 250 copies / mL. A negative result does not preclude SARS-CoV-2 infection and should not be used as the sole basis for treatment or other patient management decisions.  A negative result may occur with improper specimen collection / handling, submission of specimen other than nasopharyngeal swab, presence of viral mutation(s) within the areas targeted by this assay, and inadequate number of viral copies (<250 copies / mL). A negative result must be combined with clinical observations, patient history, and epidemiological information. Fact Sheet for Patients:   StrictlyIdeas.no Fact Sheet for Healthcare Providers: BankingDealers.co.za This test is not yet approved or cleared  by the [place] FDA and has been authorized for detection and/or diagnosis of SARS-CoV-2 by FDA under an Emergency Use Authorization (EUA).  This EUA will remain in effect (meaning this test can be used) for the duration of the COVID-19 declaration under Section 564(b)(1) of the Act, 21 U.S.C. section 360bbb-3(b)(1), unless the authorization is terminated or revoked sooner. Performed at [facility], [place] [address], [place], [place] [address]     Procedures/Studies: DG CHEST PORT 1 VIEW  Result Date: 01/06/2020 CLINICAL DATA:  Pt presents to [facility] due to productive cough and fever at home. She states that she had been feeling ill with associated body aches as well since  Wednesday with continued progression into Friday. H/o CAD, Type II Diabetes. Former smoker. EXAM: PORTABLE CHEST 1 VIEW COMPARISON:  01/05/2020.  11/08/2017. FINDINGS: Left upper lobe opacity, extending from the left superior hilum, has mildly  increased in size. Remainder of the lungs is clear. No pleural effusion or pneumothorax. Stable changes from previous cardiac surgery. Cardiac silhouette is normal in size. No mediastinal or hilar masses. Skeletal structures are grossly intact. IMPRESSION: 1. Left upper lobe pneumonia, increased when compared to the previous day's chest radiograph. Electronically Signed   By: Lajean Manes M.D.   On: 01/06/2020 16:18   CT Renal Stone Study  Result Date: 01/05/2020 CLINICAL DATA:  Hematuria and flank pain, initial encounter EXAM: CT ABDOMEN AND PELVIS WITHOUT CONTRAST TECHNIQUE: Multidetector CT imaging of the abdomen and pelvis was performed following the standard protocol without IV contrast. COMPARISON:  01/16/2019 FINDINGS: Lower chest: No acute abnormality. Hepatobiliary: No focal liver abnormality is seen. No gallstones, gallbladder wall thickening, or biliary dilatation. Pancreas: Unremarkable. No pancreatic ductal dilatation or surrounding inflammatory changes. Spleen: Normal in size without focal abnormality. Adrenals/Urinary Tract: Adrenal glands are within normal limits. Kidneys demonstrate tiny nonobstructing renal calculi bilaterally. The bladder is partially distended. No ureteral stones are seen. Stomach/Bowel: No obstructive or inflammatory changes of colon are noted. The appendix is within normal limits. No obstructive changes of the small bowel are noted. The stomach is decompressed. Vascular/Lymphatic: Aortic atherosclerosis. No enlarged abdominal or pelvic lymph nodes. Reproductive: Status post hysterectomy. No adnexal masses. Other: No abdominal wall hernia or abnormality. No abdominopelvic ascites. Musculoskeletal: No acute or significant osseous  findings. IMPRESSION: Tiny nonobstructing renal calculi bilaterally. No acute abnormality noted. Electronically Signed   By: Inez Catalina M.D.   On: 01/05/2020 17:49    Labs: BNP (last 3 results) No results for input(s): BNP in the last 8760 hours. Basic Metabolic Panel: Recent Labs  Lab 01/05/20 1659 01/06/20 0500 01/07/20 0402 01/08/20 0439 01/09/20 0405  NA 135 138 138 138 139  K 3.9 4.1 4.0 3.9 4.0  CL 104 111 110 109 111  CO2 20* 19* 18* 19* 18*  GLUCOSE 131* 131* 90 93 107*  BUN 12 12 11 11 11   CREATININE 1.02* 0.92 0.86 0.78 0.76  CALCIUM 9.4 9.2 8.6* 8.6* 9.2  MG  --   --  1.9 1.9 2.0  PHOS  --   --  3.0 3.8 4.4   Liver Function Tests: Recent Labs  Lab 01/05/20 1659 01/06/20 0500 01/07/20 0402 01/08/20 0439 01/09/20 0405  AST 28 29 41 56* 57*  ALT 22 27 38 59* 72*  ALKPHOS 88 70 66 65 65  BILITOT 0.8 0.8 0.4 0.6 0.5  PROT 8.6* 7.1 6.5 6.5 6.6  ALBUMIN 4.4 3.4* 3.0* 3.1* 3.1*   No results for input(s): LIPASE, AMYLASE in the last 168 hours. No results for input(s): AMMONIA in the last 168 hours. CBC: Recent Labs  Lab 01/05/20 1659 01/06/20 0500 01/07/20 0402 01/08/20 0439 01/09/20 0405  WBC 10.4 6.9 5.2 5.0 5.3  NEUTROABS 7.3 4.6 2.9 2.2 2.7  HGB 14.1 12.2 11.1* 10.7* 11.1*  HCT 42.7 37.8 34.8* 33.4* 34.4*  MCV 96.6 96.2 98.6 97.1 96.4  PLT 175 149* 140* 146* 166   Cardiac Enzymes: No results for input(s): CKTOTAL, CKMB, CKMBINDEX, TROPONINI in the last 168 hours. BNP: Invalid input(s): POCBNP CBG: Recent Labs  Lab 01/08/20 0722 01/08/20 1158 01/08/20 1741 01/08/20 2105 01/09/20 0756  GLUCAP 100* 118* 106* 171* 105*   D-Dimer No results for input(s): DDIMER in the last 72 hours. Hgb A1c No results for input(s): HGBA1C in the last 72 hours. Lipid Profile Recent Labs    01/09/20 0405  CHOL 79  HDL 26*  LDLCALC 40  TRIG 64  CHOLHDL 3.0   Thyroid function studies No results for input(s): TSH, T4TOTAL, T3FREE, THYROIDAB in the  last 72 hours.  Invalid input(s): FREET3 Anemia work up No results for input(s): VITAMINB12, FOLATE, FERRITIN, TIBC, IRON, RETICCTPCT in the last 72 hours. Urinalysis    Component Value Date/Time   COLORURINE YELLOW 01/05/2020 1625   APPEARANCEUR CLEAR 01/05/2020 1625   LABSPEC 1.008 01/05/2020 1625   PHURINE 5.0 01/05/2020 1625   GLUCOSEU NEGATIVE 01/05/2020 1625   HGBUR LARGE (A) 01/05/2020 1625   BILIRUBINUR NEGATIVE 01/05/2020 1625   KETONESUR NEGATIVE 01/05/2020 1625   PROTEINUR NEGATIVE 01/05/2020 1625   NITRITE NEGATIVE 01/05/2020 1625   LEUKOCYTESUR NEGATIVE 01/05/2020 1625   Sepsis Labs Invalid input(s): PROCALCITONIN,  WBC,  LACTICIDVEN Microbiology Recent Results (from the past 240 hour(s))  Blood Culture (routine x 2)     Status: None (Preliminary result)   Collection Time: 01/05/20  4:25 PM   Specimen: BLOOD  Result Value Ref Range Status   Specimen Description   Final    BLOOD RIGHT ANTECUBITAL Performed at [facility], [place] [address] [place], [place] [address]    Special Requests   Final    BOTTLES DRAWN AEROBIC AND ANAEROBIC Blood Culture adequate volume Performed at [facility] [address], [place], [place] [address]    Culture   Final    NO GROWTH 4 DAYS Performed at [facility], [place] [address], [place], [place] [address]    Report Status PENDING  Incomplete  Urine culture     Status: Abnormal   Collection Time: 01/05/20  4:25 PM   Specimen: In/Out Cath Urine  Result Value Ref Range Status   Specimen Description   Final    IN/OUT CATH URINE Performed at [facility] [address], [place], [place] [address]    Special Requests   Final    NONE Performed at [facility], [place] [address] [place], [place] [address]    Culture MULTIPLE SPECIES PRESENT, SUGGEST RECOLLECTION (A)  Final   Report Status 01/07/2020 FINAL  Final  Blood Culture (routine x 2)      Status: None (Preliminary result)   Collection Time: 01/05/20  4:30 PM   Specimen: BLOOD  Result Value Ref Range Status   Specimen Description   Final    BLOOD RIGHT WRIST Performed at [facility] [address], [place], [place] [address]    Special Requests   Final    BOTTLES DRAWN AEROBIC AND ANAEROBIC Blood Culture adequate volume Performed at [facility] [address] [place], [place] [address]    Culture   Final    NO GROWTH 4 DAYS Performed at [facility], [place] [address], [place], [place] [address]    Report Status PENDING  Incomplete  Expectorated sputum assessment w rflx to resp cult     Status: None   Collection Time: 01/06/20 12:21 PM   Specimen: Sputum  Result Value Ref Range Status   Specimen Description SPUTUM  Final   Special Requests NONE  Final   Sputum evaluation   Final    Sputum specimen not acceptable for testing.  Please recollect.   SPOKE WITH ROGERS RN [phone] 01/06/20 FOR A RECOLLECT JM Performed at [facility], [place] [address], [place],  [address]    Report Status 01/06/2020 FINAL  Final  SARS Coronavirus 2 by RT  PCR (hospital order, performed in [facility] hospital lab) Nasopharyngeal Nasopharyngeal Swab     Status: None   Collection Time: 01/06/20 12:32 PM   Specimen: Nasopharyngeal Swab  Result Value Ref Range Status   SARS Coronavirus 2 NEGATIVE NEGATIVE Final    Comment: (NOTE) SARS-CoV-2 target nucleic acids are NOT DETECTED. The SARS-CoV-2 RNA is generally detectable in upper and lower respiratory specimens during the acute phase of infection. The lowest concentration of SARS-CoV-2 viral copies this assay can detect is 250 copies / mL. A negative result does not preclude SARS-CoV-2 infection and should not be used as the sole basis for treatment or other patient management decisions.  A negative result may occur with improper specimen collection / handling, submission  of specimen other than nasopharyngeal swab, presence of viral mutation(s) within the areas targeted by this assay, and inadequate number of viral copies (<250 copies / mL). A negative result must be combined with clinical observations, patient history, and epidemiological information. Fact Sheet for Patients:   StrictlyIdeas.no Fact Sheet for Healthcare Providers: BankingDealers.co.za This test is not yet approved or cleared  by the [place] FDA and has been authorized for detection and/or diagnosis of SARS-CoV-2 by FDA under an Emergency Use Authorization (EUA).  This EUA will remain in effect (meaning this test can be used) for the duration of the COVID-19 declaration under Section 564(b)(1) of the Act, 21 U.S.C. section 360bbb-3(b)(1), unless the authorization is terminated or revoked sooner. Performed at [facility], [place] [address], [place], [place] [address]      Time coordinating discharge: 25 minutes  SIGNED: Antonieta Pert, MD  [facility] 01/09/2020, 11:19 AM  If 7PM-7AM, please contact night-coverage [email or web address]

## 2020-01-09 NOTE — Progress Notes (Signed)
Pt alert and oriented. D/C instructions given to her and her daughter. Pt was d/cd home.

## 2020-01-10 LAB — CULTURE, BLOOD (ROUTINE X 2)
Culture: NO GROWTH
Culture: NO GROWTH
Special Requests: ADEQUATE
Special Requests: ADEQUATE

## 2020-02-13 NOTE — Progress Notes (Signed)
Cardiology Office Note    Date:  02/19/2020   ID:  Samantha Huffman, DOB 05/21/1956, MRN [id number]  PCP:  Alroy Dust, Carlean Jews.Marlou Sa, MD  Cardiologist: Fransico Him, MD EPS: None  Chief Complaint  Patient presents with  . Follow-up    History of Present Illness:  Samantha Huffman is a 64 y.o. female with a history of CAD status post CABG 10/2012 in [place], abnormal stress test 2017 but cath showed patent grafts to the OM and LAD and 30% RCA normal LV function. Patient has had periodic chest pain and was placed on Imdur for possible microvascular angina. PCP was treating her for anxiety. 2D echo 07/2018 EF 60 to 65% with grade 1 DD.   Repeat cardiac catheterization 09/04/2018 due to ongoing CP and found to have patent LIMA to the LAD, patent free radial graft to the OM, normal LV function, low LV filling pressures. Chlorthalidone and Lasix were held because of low filling pressures and blood pressure otherwise medical therapy continued. She also has HLD, HTN, DM2, OSA on CPAP and carotid artery dz as well as tobacco abuse.   Last saw Dr. Radford Pax 09/06/19 and complained of chronic DOE from smoking that was unchanged. Encouraged to use CPAP daily.  Patient hospitalized in 02-02-23 with pneumonia and sepsis. Also found kidney stones and sugars over 500. She is still recovering. Breathing is actually better. Walks 15 min around her house. Gradually getting energy back. Started back smoking( 1/3 pk/day) after her brother died suddenly in 02/02/23.   Past Medical History:  Diagnosis Date  . Carotid artery stenosis    1-39% bilateral stenosis by dopplers 02/01/2018  . Coronary artery disease    a. s/p CABG 10/2012.  cath 05/2016 with moderate LM stenosis, mild LCx and RCA stenosis and occluded LAD with patent LIMA to LAD, free radial to OM.  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  Marland Kitchen Dyslipidemia   . Family history of adverse reaction to anesthesia    mother and sister ponv  . Generalized convulsive epilepsy  without mention of intractable epilepsy   . GERD (gastroesophageal reflux disease)   . History of kidney stones   . Hypertension   . Memory deficit    slight  . Migraine    "controlled on daily RX " (06/11/2016)  . Nephrolithiasis 07/30/2015   S/p lithotripsy x 3 and right and left ureter stents  . OSA (obstructive sleep apnea)    cpap   . PONV (postoperative nausea and vomiting)   . Seizures ([place])    "controlled w/daily RX; started in 2012; dr thinks they might be from the migraines; last sz was early part of 2016" (06/11/2016)  . Type II diabetes mellitus (HCC)    metformin  . Vasovagal syncope 07/30/2015   2011    Past Surgical History:  Procedure Laterality Date  . CARDIAC CATHETERIZATION  2014; 06/11/2016  . CARDIAC CATHETERIZATION N/A 06/11/2016   Procedure: Left Heart Cath and Cors/Grafts Angiography;  Surgeon: Sherren Mocha, MD;  Location: [facility];  Service: Cardiovascular;  Laterality: N/A;  . CESAREAN SECTION    . CORONARY ANGIOPLASTY    . CORONARY ARTERY BYPASS GRAFT  March, 2014   LIMA to LAD, left radial to LCx x 2  . CYSTOSCOPY W/ URETERAL STENT PLACEMENT Left 01/16/2019   Procedure: CYSTOSCOPY WITH RETROGRADE PYELOGRAM/URETERAL LEFT STENT PLACEMENT;  Surgeon: Ardis Hughs, MD;  Location: [facility];  Service: Urology;  Laterality: Left;  . CYSTOSCOPY/URETEROSCOPY/HOLMIUM LASER/STENT PLACEMENT Left  01/24/2019   Procedure: CYSTOSCOPY, LEFT URETEROSCOPY, HOLMIUM LASER, STONE REMOVAL AND STENT EXCHANGE;  Surgeon: Ardis Hughs, MD;  Location: [facility];  Service: Urology;  Laterality: Left;  . LITHOTRIPSY  X 3  . RIGHT/LEFT HEART CATH AND CORONARY/GRAFT ANGIOGRAPHY N/A 09/08/2018   Procedure: RIGHT/LEFT HEART CATH AND CORONARY/GRAFT ANGIOGRAPHY;  Surgeon: Martinique, Peter M, MD;  Location: [facility];  Service: Cardiovascular;  Laterality: N/A;  . TOTAL ABDOMINAL HYSTERECTOMY     ovaries took before hysterectomy  . TUBAL LIGATION    . URETERAL STENT  PLACEMENT      Current Medications: Current Meds  Medication Sig  . acetaminophen (TYLENOL) 325 MG tablet Take 2 tablets (650 mg total) by mouth every 4 (four) hours as needed for headache or mild pain.  . Alirocumab (PRALUENT) 75 MG/ML SOAJ Inject 1 pen into the skin every 14 (fourteen) days.  . ALPRAZolam (XANAX) 0.25 MG tablet Take 0.25 mg by mouth 3 (three) times daily as needed for anxiety.   Marland Kitchen aspirin EC 81 MG tablet Take 81 mg by mouth daily.  Marland Kitchen atorvastatin (LIPITOR) 80 MG tablet Take 1 tablet (80 mg total) by mouth daily.  . carvedilol (COREG) 12.5 MG tablet Take 1 tablet (12.5 mg total) by mouth 2 (two) times daily.  . clopidogrel (PLAVIX) 75 MG tablet Take 1 tablet (75 mg total) by mouth daily.  . clotrimazole-betamethasone (LOTRISONE) cream Apply 1 application topically daily as needed (skin folds (under breasts)).   Marland Kitchen escitalopram (LEXAPRO) 10 MG tablet Take 10 mg by mouth at bedtime.   Marland Kitchen ezetimibe (ZETIA) 10 MG tablet TAKE 1 TABLET (10 MG TOTAL) BY MOUTH DAILY. (Patient taking differently: Take 10 mg by mouth at bedtime. )  . isosorbide mononitrate (IMDUR) 120 MG 24 hr tablet Take 1 tablet (120 mg total) by mouth daily.  Marland Kitchen levETIRAcetam (KEPPRA) 500 MG tablet Take 1 tablet (500 mg total) by mouth 2 (two) times daily.  Marland Kitchen lisinopril (ZESTRIL) 20 MG tablet Take 1 tablet (20 mg total) by mouth daily.  . metFORMIN (GLUCOPHAGE-XR) 500 MG 24 hr tablet Take 500 mg by mouth daily with breakfast.   . nitroGLYCERIN (NITROSTAT) 0.4 MG SL tablet Place 1 tablet (0.4 mg total) under the tongue every 5 (five) minutes x 3 doses as needed for chest pain.  . pantoprazole (PROTONIX) 40 MG tablet TAKE 1 TABLET BY MOUTH EVERY DAY (Patient taking differently: Take 40 mg by mouth daily. )  . Potassium Citrate 15 MEQ (1620 MG) TBCR Take 1 tablet by mouth in the morning and at bedtime.  . topiramate (TOPAMAX) 25 MG tablet Take 3 tablets (75 mg total) by mouth 2 (two) times daily.     Allergies:    Depakote [divalproex sodium] and Penicillins   Social History   Socioeconomic History  . Marital status: Divorced    Spouse name: Not on file  . Number of children: 2  . Years of education: 38  . Highest education level: Not on file  Occupational History  . Occupation: retired  Tobacco Use  . Smoking status: Current Some Day Smoker    Packs/day: 0.33    Years: 35.00    Pack years: 11.55    Types: Cigarettes    Last attempt to quit: 06/11/2016    Years since quitting: 3.6  . Smokeless tobacco: Former Systems developer    Types: Snuff, Sarina Ser    Quit date: 11/20/2012  Vaping Use  . Vaping Use: Never used  Substance and Sexual  Activity  . Alcohol use: Yes    Comment: 06/11/2016 "might have a few drinks/month; usually wine"  . Drug use: No  . Sexual activity: Not Currently  Other Topics Concern  . Not on file  Social History Narrative   Patient drinks about 1 cup of coffee daily.   Patient is right handed.    Social Determinants of Health   Financial Resource Strain:   . Difficulty of Paying Living Expenses:   Food Insecurity:   . Worried About Charity fundraiser in the Last Year:   . Arboriculturist in the Last Year:   Transportation Needs:   . Film/video editor (Medical):   Marland Kitchen Lack of Transportation (Non-Medical):   Physical Activity:   . Days of Exercise per Week:   . Minutes of Exercise per Session:   Stress:   . Feeling of Stress :   Social Connections:   . Frequency of Communication with Friends and Family:   . Frequency of Social Gatherings with Friends and Family:   . Attends Religious Services:   . Active Member of Clubs or Organizations:   . Attends Archivist Meetings:   Marland Kitchen Marital Status:      Family History:  The patient's family history includes Cancer in her father; Diabetes in her brother, brother, and father; Heart attack in her sister; Heart disease in her brother and sister; Stroke in her brother.   ROS:   Please see the history of present  illness.    ROS All other systems reviewed and are negative.   PHYSICAL EXAM:   VS:  BP 136/60   Pulse 83   Ht '5\' 1"'  (1.549 m)   Wt 138 lb (62.6 kg)   SpO2 99%   BMI 26.07 kg/m   Physical Exam  GEN: Well nourished, well developed, in no acute distress  Neck: no JVD, carotid bruits, or masses Cardiac:RRR; 2/4 systolic murmur.  Respiratory: Decreased breath sounds but clear to auscultation bilaterally, normal work of breathing GI: soft, nontender, nondistended, + BS Ext: without cyanosis, clubbing, or edema, Good distal pulses bilaterally Neuro:  Alert and Oriented x 3 Psych: euthymic mood, full affect  Wt Readings from Last 3 Encounters:  02/19/20 138 lb (62.6 kg)  01/06/20 144 lb (65.3 kg)  09/06/19 145 lb (65.8 kg)      Studies/Labs Reviewed:   EKG:  EKG is not ordered today.    Recent Labs: 01/09/2020: ALT 72; BUN 11; Creatinine, Ser 0.76; Hemoglobin 11.1; Magnesium 2.0; Platelets 166; Potassium 4.0; Sodium 139   Lipid Panel    Component Value Date/Time   CHOL 79 01/09/2020 0405   CHOL 136 03/12/2019 1043   TRIG 64 01/09/2020 0405   HDL 26 (L) 01/09/2020 0405   HDL 46 03/12/2019 1043   CHOLHDL 3.0 01/09/2020 0405   VLDL 13 01/09/2020 0405   LDLCALC 40 01/09/2020 0405   LDLCALC 35 06/18/2019 1119   LDLDIRECT 67 06/13/2018 1517    Additional studies/ records that were reviewed today include:   Cath 09/09/19  Ost LM lesion is 50% stenosed.  Prox LAD-2 lesion is 100% stenosed.  Prox LAD-1 lesion is 50% stenosed.  Prox Cx lesion is 75% stenosed.  Mid RCA lesion is 30% stenosed.  LIMA.  Left radial artery graft was visualized by angiography and is normal in caliber.  The graft exhibits no disease.  The left ventricular systolic function is normal.  LV end diastolic pressure is normal.  The  left ventricular ejection fraction is 55-65% by visual estimate.  There is no mitral valve regurgitation.   1, Left main and 2 vessel obstructive CAD.  2.  Patent LIMA to the LAD 3. Patent free radial graft to the OM 4. Low LV filling pressures 5. Normal right heart pressures.    Plan: will hold chlorthalidone and lasix. Filling pressures and BP are low. Otherwise continue medical therapy.       ASSESSMENT:    1. Coronary artery disease involving coronary bypass graft of native heart without angina pectoris   2. Dyspnea on exertion   3. Essential hypertension   4. Hyperlipidemia, unspecified hyperlipidemia type   5. Mild carotid artery disease ([place])   6. Diabetes mellitus without complication ([place])   7. OSA (obstructive sleep apnea)   8. Tobacco abuse      PLAN:  In order of problems listed above:  CAD  S/P CABG x 2 2014-cardiac cath 08/2018 patent LIMA to the LAD and patent free radial graft to the OM on Plavix, aspirin, statin, beta-blocker and Imdur-no chest pain  Chronic dyspnea on exertion PFTs ordered but never done-still recovering from pneumonia and sepsis in May but breathing improved  Essential hypertension-BP controlled  Hyperlipidemia LDL 40 12/2019 on atorvastatin Praluent and Zetia  Carotid stenosis 1 to 39% in 2019  DM type II on Metformin and statin  OSA on CPAP-using it more  Tobacco abuse -smoking cessation discussed and willing to try nicotine patches.  Medication Adjustments/Labs and Tests Ordered: Current medicines are reviewed at length with the patient today.  Concerns regarding medicines are outlined above.  Medication changes, Labs and Tests ordered today are listed in the Patient Instructions below. Patient Instructions  Medication Instructions:  Your physician recommends that you continue on your current medications as directed. Please refer to the Current Medication list given to you today.  A prescription for the Nicotine 21-14-7 mg/24 hr kit has been sent to your pharmacy. Use as directed.  *If you need a refill on your cardiac medications before your next appointment, please call your  pharmacy*   Lab Work: None  If you have labs (blood work) drawn today and your tests are completely normal, you will receive your results only by: Marland Kitchen MyChart Message (if you have MyChart) OR . A paper copy in the mail If you have any lab test that is abnormal or we need to change your treatment, we will call you to review the results.   Testing/Procedures: None   Follow-Up: At [facility], you and your health needs are our priority.  As part of our continuing mission to provide you with exceptional heart care, we have created designated Provider Care Teams.  These Care Teams include your primary Cardiologist (physician) and Advanced Practice Providers (APPs -  Physician Assistants and Nurse Practitioners) who all work together to provide you with the care you need, when you need it.  We recommend signing up for the patient portal called "MyChart".  Sign up information is provided on this After Visit Summary.  MyChart is used to connect with patients for Virtual Visits (Telemedicine).  Patients are able to view lab/test results, encounter notes, upcoming appointments, etc.  Non-urgent messages can be sent to your provider as well.   To learn more about what you can do with MyChart, go to [email or web address].    Your next appointment:   6 month(s)  The format for your next appointment:   In Person  Provider:   You  may see Fransico Him, MD or one of the following Advanced Practice Providers on your designated Care Team:    Melina Copa, PA-C  Ermalinda Barrios, PA-C    Other Instructions  Steps to Quit Smoking Smoking tobacco is the leading cause of preventable death. It can affect almost every organ in the body. Smoking puts you and people around you at risk for many serious, long-lasting (chronic) diseases. Quitting smoking can be hard, but it is one of the best things that you can do for your health. It is never too late to quit. How do I get ready to quit? When you decide  to quit smoking, make a plan to help you succeed. Before you quit:  Pick a date to quit. Set a date within the next 2 weeks to give you time to prepare.  Write down the reasons why you are quitting. Keep this list in places where you will see it often.  Tell your family, friends, and co-workers that you are quitting. Their support is important.  Talk with your doctor about the choices that may help you quit.  Find out if your health insurance will pay for these treatments.  Know the people, places, things, and activities that make you want to smoke (triggers). Avoid them. What first steps can I take to quit smoking?  Throw away all cigarettes at home, at work, and in your car.  Throw away the things that you use when you smoke, such as ashtrays and lighters.  Clean your car. Make sure to empty the ashtray.  Clean your home, including curtains and carpets. What can I do to help me quit smoking? Talk with your doctor about taking medicines and seeing a counselor at the same time. You are more likely to succeed when you do both.  If you are pregnant or breastfeeding, talk with your doctor about counseling or other ways to quit smoking. Do not take medicine to help you quit smoking unless your doctor tells you to do so. To quit smoking: Quit right away  Quit smoking totally, instead of slowly cutting back on how much you smoke over a period of time.  Go to counseling. You are more likely to quit if you go to counseling sessions regularly. Take medicine You may take medicines to help you quit. Some medicines need a prescription, and some you can buy over-the-counter. Some medicines may contain a drug called nicotine to replace the nicotine in cigarettes. Medicines may:  Help you to stop having the desire to smoke (cravings).  Help to stop the problems that come when you stop smoking (withdrawal symptoms). Your doctor may ask you to use:  Nicotine patches, gum, or  lozenges.  Nicotine inhalers or sprays.  Non-nicotine medicine that is taken by mouth. Find resources Find resources and other ways to help you quit smoking and remain smoke-free after you quit. These resources are most helpful when you use them often. They include:  Online chats with a Social worker.  Phone quitlines.  Printed Furniture conservator/restorer.  Support groups or group counseling.  Text messaging programs.  Mobile phone apps. Use apps on your mobile phone or tablet that can help you stick to your quit plan. There are many free apps for mobile phones and tablets as well as websites. Examples include Quit Guide from the [employer] and smokefree.gov  What things can I do to make it easier to quit?   Talk to your family and friends. Ask them to support and encourage you.  Call a phone quitline (1-800-QUIT-NOW), reach out to support groups, or work with a Social worker.  Ask people who smoke to not smoke around you.  Avoid places that make you want to smoke, such as: ? Bars. ? Parties. ? Smoke-break areas at work.  Spend time with people who do not smoke.  Lower the stress in your life. Stress can make you want to smoke. Try these things to help your stress: ? Getting regular exercise. ? Doing deep-breathing exercises. ? Doing yoga. ? Meditating. ? Doing a body scan. To do this, close your eyes, focus on one area of your body at a time from head to toe. Notice which parts of your body are tense. Try to relax the muscles in those areas. How will I feel when I quit smoking? Day 1 to 3 weeks Within the first 24 hours, you may start to have some problems that come from quitting tobacco. These problems are very bad 2-3 days after you quit, but they do not often last for more than 2-3 weeks. You may get these symptoms:  Mood swings.  Feeling restless, nervous, angry, or annoyed.  Trouble concentrating.  Dizziness.  Strong desire for high-sugar foods and nicotine.  Weight  gain.  Trouble pooping (constipation).  Feeling like you may vomit (nausea).  Coughing or a sore throat.  Changes in how the medicines that you take for other issues work in your body.  Depression.  Trouble sleeping (insomnia). Week 3 and afterward After the first 2-3 weeks of quitting, you may start to notice more positive results, such as:  Better sense of smell and taste.  Less coughing and sore throat.  Slower heart rate.  Lower blood pressure.  Clearer skin.  Better breathing.  Fewer sick days. Quitting smoking can be hard. Do not give up if you fail the first time. Some people need to try a few times before they succeed. Do your best to stick to your quit plan, and talk with your doctor if you have any questions or concerns. Summary  Smoking tobacco is the leading cause of preventable death. Quitting smoking can be hard, but it is one of the best things that you can do for your health.  When you decide to quit smoking, make a plan to help you succeed.  Quit smoking right away, not slowly over a period of time.  When you start quitting, seek help from your doctor, family, or friends. This information is not intended to replace advice given to you by your health care provider. Make sure you discuss any questions you have with your health care provider. Document Revised: 05/04/2019 Document Reviewed: 10/28/2018 Elsevier Patient Education  [place], Ermalinda Barrios, [place]  02/19/2020 9:03 AM    [facility] [place], [place], [place]  [address] Phone: [phone]; Fax: [phone]

## 2020-02-19 ENCOUNTER — Encounter: Payer: Self-pay | Admitting: Physician Assistant

## 2020-02-19 ENCOUNTER — Ambulatory Visit: Payer: Medicare HMO | Admitting: Physician Assistant

## 2020-02-19 ENCOUNTER — Other Ambulatory Visit: Payer: Self-pay

## 2020-02-19 VITALS — BP 136/60 | HR 83 | Ht 61.0 in | Wt 138.0 lb

## 2020-02-19 DIAGNOSIS — I25810 Atherosclerosis of coronary artery bypass graft(s) without angina pectoris: Secondary | ICD-10-CM | POA: Diagnosis not present

## 2020-02-19 DIAGNOSIS — E785 Hyperlipidemia, unspecified: Secondary | ICD-10-CM

## 2020-02-19 DIAGNOSIS — R0600 Dyspnea, unspecified: Secondary | ICD-10-CM

## 2020-02-19 DIAGNOSIS — Z720 Tobacco use: Secondary | ICD-10-CM

## 2020-02-19 DIAGNOSIS — I10 Essential (primary) hypertension: Secondary | ICD-10-CM

## 2020-02-19 DIAGNOSIS — R0609 Other forms of dyspnea: Secondary | ICD-10-CM

## 2020-02-19 DIAGNOSIS — E119 Type 2 diabetes mellitus without complications: Secondary | ICD-10-CM

## 2020-02-19 DIAGNOSIS — I779 Disorder of arteries and arterioles, unspecified: Secondary | ICD-10-CM

## 2020-02-19 DIAGNOSIS — G4733 Obstructive sleep apnea (adult) (pediatric): Secondary | ICD-10-CM

## 2020-02-19 MED ORDER — NICOTINE 21-14-7 MG/24HR TD KIT
PACK | TRANSDERMAL | 0 refills | Status: DC
Start: 2020-02-19 — End: 2021-10-06

## 2020-02-19 NOTE — Patient Instructions (Signed)
Medication Instructions:  Your physician recommends that you continue on your current medications as directed. Please refer to the Current Medication list given to you today.  A prescription for the Nicotine 21-14-7 mg/24 hr kit has been sent to your pharmacy. Use as directed.  *If you need a refill on your cardiac medications before your next appointment, please call your pharmacy*   Lab Work: None  If you have labs (blood work) drawn today and your tests are completely normal, you will receive your results only by: Marland Kitchen MyChart Message (if you have MyChart) OR . A paper copy in the mail If you have any lab test that is abnormal or we need to change your treatment, we will call you to review the results.   Testing/Procedures: None   Follow-Up: At [facility], you and your health needs are our priority.  As part of our continuing mission to provide you with exceptional heart care, we have created designated Provider Care Teams.  These Care Teams include your primary Cardiologist (physician) and Advanced Practice Providers (APPs -  Physician Assistants and Nurse Practitioners) who all work together to provide you with the care you need, when you need it.  We recommend signing up for the patient portal called "MyChart".  Sign up information is provided on this After Visit Summary.  MyChart is used to connect with patients for Virtual Visits (Telemedicine).  Patients are able to view lab/test results, encounter notes, upcoming appointments, etc.  Non-urgent messages can be sent to your provider as well.   To learn more about what you can do with MyChart, go to [email or web address].    Your next appointment:   6 month(s)  The format for your next appointment:   In Person  Provider:   You may see Fransico Him, MD or one of the following Advanced Practice Providers on your designated Care Team:    Melina Copa, PA-C  Ermalinda Barrios, PA-C    Other Instructions  Steps to Quit  Smoking Smoking tobacco is the leading cause of preventable death. It can affect almost every organ in the body. Smoking puts you and people around you at risk for many serious, long-lasting (chronic) diseases. Quitting smoking can be hard, but it is one of the best things that you can do for your health. It is never too late to quit. How do I get ready to quit? When you decide to quit smoking, make a plan to help you succeed. Before you quit:  Pick a date to quit. Set a date within the next 2 weeks to give you time to prepare.  Write down the reasons why you are quitting. Keep this list in places where you will see it often.  Tell your family, friends, and co-workers that you are quitting. Their support is important.  Talk with your doctor about the choices that may help you quit.  Find out if your health insurance will pay for these treatments.  Know the people, places, things, and activities that make you want to smoke (triggers). Avoid them. What first steps can I take to quit smoking?  Throw away all cigarettes at home, at work, and in your car.  Throw away the things that you use when you smoke, such as ashtrays and lighters.  Clean your car. Make sure to empty the ashtray.  Clean your home, including curtains and carpets. What can I do to help me quit smoking? Talk with your doctor about taking medicines and seeing a counselor at  the same time. You are more likely to succeed when you do both.  If you are pregnant or breastfeeding, talk with your doctor about counseling or other ways to quit smoking. Do not take medicine to help you quit smoking unless your doctor tells you to do so. To quit smoking: Quit right away  Quit smoking totally, instead of slowly cutting back on how much you smoke over a period of time.  Go to counseling. You are more likely to quit if you go to counseling sessions regularly. Take medicine You may take medicines to help you quit. Some medicines  need a prescription, and some you can buy over-the-counter. Some medicines may contain a drug called nicotine to replace the nicotine in cigarettes. Medicines may:  Help you to stop having the desire to smoke (cravings).  Help to stop the problems that come when you stop smoking (withdrawal symptoms). Your doctor may ask you to use:  Nicotine patches, gum, or lozenges.  Nicotine inhalers or sprays.  Non-nicotine medicine that is taken by mouth. Find resources Find resources and other ways to help you quit smoking and remain smoke-free after you quit. These resources are most helpful when you use them often. They include:  Online chats with a Social worker.  Phone quitlines.  Printed Furniture conservator/restorer.  Support groups or group counseling.  Text messaging programs.  Mobile phone apps. Use apps on your mobile phone or tablet that can help you stick to your quit plan. There are many free apps for mobile phones and tablets as well as websites. Examples include Quit Guide from the [employer] and smokefree.gov  What things can I do to make it easier to quit?   Talk to your family and friends. Ask them to support and encourage you.  Call a phone quitline (1-800-QUIT-NOW), reach out to support groups, or work with a Social worker.  Ask people who smoke to not smoke around you.  Avoid places that make you want to smoke, such as: ? Bars. ? Parties. ? Smoke-break areas at work.  Spend time with people who do not smoke.  Lower the stress in your life. Stress can make you want to smoke. Try these things to help your stress: ? Getting regular exercise. ? Doing deep-breathing exercises. ? Doing yoga. ? Meditating. ? Doing a body scan. To do this, close your eyes, focus on one area of your body at a time from head to toe. Notice which parts of your body are tense. Try to relax the muscles in those areas. How will I feel when I quit smoking? Day 1 to 3 weeks Within the first 24 hours, you may  start to have some problems that come from quitting tobacco. These problems are very bad 2-3 days after you quit, but they do not often last for more than 2-3 weeks. You may get these symptoms:  Mood swings.  Feeling restless, nervous, angry, or annoyed.  Trouble concentrating.  Dizziness.  Strong desire for high-sugar foods and nicotine.  Weight gain.  Trouble pooping (constipation).  Feeling like you may vomit (nausea).  Coughing or a sore throat.  Changes in how the medicines that you take for other issues work in your body.  Depression.  Trouble sleeping (insomnia). Week 3 and afterward After the first 2-3 weeks of quitting, you may start to notice more positive results, such as:  Better sense of smell and taste.  Less coughing and sore throat.  Slower heart rate.  Lower blood pressure.  Clearer  skin.  Better breathing.  Fewer sick days. Quitting smoking can be hard. Do not give up if you fail the first time. Some people need to try a few times before they succeed. Do your best to stick to your quit plan, and talk with your doctor if you have any questions or concerns. Summary  Smoking tobacco is the leading cause of preventable death. Quitting smoking can be hard, but it is one of the best things that you can do for your health.  When you decide to quit smoking, make a plan to help you succeed.  Quit smoking right away, not slowly over a period of time.  When you start quitting, seek help from your doctor, family, or friends. This information is not intended to replace advice given to you by your health care provider. Make sure you discuss any questions you have with your health care provider. Document Revised: 05/04/2019 Document Reviewed: 10/28/2018 Elsevier Patient Education  [place].

## 2020-04-03 ENCOUNTER — Ambulatory Visit: Payer: Medicare HMO | Admitting: Neurology

## 2020-04-03 ENCOUNTER — Other Ambulatory Visit: Payer: Self-pay

## 2020-04-03 ENCOUNTER — Encounter: Payer: Self-pay | Admitting: Neurology

## 2020-04-03 VITALS — BP 154/67 | HR 61 | Ht 61.0 in | Wt 141.0 lb

## 2020-04-03 DIAGNOSIS — R413 Other amnesia: Secondary | ICD-10-CM | POA: Diagnosis not present

## 2020-04-03 DIAGNOSIS — R569 Unspecified convulsions: Secondary | ICD-10-CM | POA: Diagnosis not present

## 2020-04-03 DIAGNOSIS — G441 Vascular headache, not elsewhere classified: Secondary | ICD-10-CM | POA: Diagnosis not present

## 2020-04-03 MED ORDER — TOPIRAMATE 25 MG PO TABS: 75.0000 mg | ORAL_TABLET | Freq: Two times a day (BID) | ORAL | 4 refills | Status: DC

## 2020-04-03 MED ORDER — LEVETIRACETAM 500 MG PO TABS: 500.0000 mg | ORAL_TABLET | Freq: Two times a day (BID) | ORAL | 3 refills | Status: DC

## 2020-04-03 NOTE — Patient Instructions (Signed)
Continue current medications  I will reach out to your urologist about the Topamax  Memory score is great 30/30 today See you back in 6 months

## 2020-04-03 NOTE — Progress Notes (Addendum)
PATIENT: Samantha Huffman DOB: 11-01-1955  REASON FOR VISIT: follow up HISTORY FROM: patient  HISTORY OF PRESENT ILLNESS: Today 04/03/20 Samantha Huffman 64 year old female history of migraines and seizure disorder that is well controlled on Topamax and Keppra.  Has reported mild memory disturbance. Has had some history of kidney stones.  She was hospitalized in May for pneumonia. No seizures since last seen, headaches remain well-controlled. Seeing Dr. Diona Fanti with urology.  Was seen recently, for kidney stone follow-up, hematuria, there was no blood in the urine.  Tolerating medications well.  She says-history of kidney stones.  Question of whether Topamax needs to be discontinued?  She lives alone, drives a car.  Her children go to the grocery store with her.  Memory is overall stablem 30/30 today.  No falls, she may lose her balance at times.  Presents today for evaluation unaccompanied.  HISTORY  04/03/2019 SS: Samantha Huffman is a 64 year old female with seizure disorder that has been well controlled on combination of Topamax and Keppra.  She does report a mild memory disturbance.  She was admitted for a kidney stone 01/24/2019, shortness of breath 09/08/2018. She has been on Topamax since 2017. For her kidney stone, she had a stent placed for a week, then had surgery to have it retrieved. She has had kidney stones all her life. She denies recurrent seizures. She says her memory is stable. She has problems in the grocery store, it takes her a very long time. Her children will go with her and it is much better. She lives alone, but her daughters come and check on her. She drives a car well. Her headaches are under good control. She did have some pain in her head recently when she was upset. Typically her headaches are on the right side of her head, she knows if the pain goes to the left side of her head a seizure is near. She presents for follow-up unaccompanied.   REVIEW OF SYSTEMS: Out of a complete 14  system review of symptoms, the patient complains only of the following symptoms, and all other reviewed systems are negative.  Headache, seizure  ALLERGIES: Allergies  Allergen Reactions  . Depakote [Divalproex Sodium] Other (See Comments)    Hyperammonemia  . Penicillins Hives    Other reaction(s): GI intolerance Has patient had a PCN reaction causing immediate rash, facial/tongue/throat swelling, SOB or lightheadedness with hypotension: YES Has patient had a PCN reaction causing severe rash involving mucus membranes or skin necrosis: NO Has patient had a PCN reaction that required hospitalizationNO Has patient had a PCN reaction occurring within the last 10 years: NO If all of the above answers are "NO", then may proceed with Cephalosporin use.    HOME MEDICATIONS: Outpatient Medications Prior to Visit  Medication Sig Dispense Refill  . acetaminophen (TYLENOL) 325 MG tablet Take 2 tablets (650 mg total) by mouth every 4 (four) hours as needed for headache or mild pain.    . Alirocumab (PRALUENT) 75 MG/ML SOAJ Inject 1 pen into the skin every 14 (fourteen) days. 2 pen 11  . ALPRAZolam (XANAX) 0.25 MG tablet Take 0.25 mg by mouth 3 (three) times daily as needed for anxiety.   0  . aspirin EC 81 MG tablet Take 81 mg by mouth daily.    Marland Kitchen atorvastatin (LIPITOR) 80 MG tablet Take 1 tablet (80 mg total) by mouth daily. 90 tablet 2  . carvedilol (COREG) 12.5 MG tablet Take 1 tablet (12.5 mg total) by mouth 2 (  two) times daily. 180 tablet 2  . clopidogrel (PLAVIX) 75 MG tablet Take 1 tablet (75 mg total) by mouth daily. 90 tablet 2  . clotrimazole-betamethasone (LOTRISONE) cream Apply 1 application topically daily as needed (skin folds (under breasts)).     Marland Kitchen escitalopram (LEXAPRO) 10 MG tablet Take 10 mg by mouth at bedtime.     Marland Kitchen ezetimibe (ZETIA) 10 MG tablet TAKE 1 TABLET (10 MG TOTAL) BY MOUTH DAILY. (Patient taking differently: Take 10 mg by mouth at bedtime. ) 90 tablet 2  . isosorbide  mononitrate (IMDUR) 120 MG 24 hr tablet Take 1 tablet (120 mg total) by mouth daily. 90 tablet 2  . levETIRAcetam (KEPPRA) 500 MG tablet Take 1 tablet (500 mg total) by mouth 2 (two) times daily. 180 tablet 3  . lisinopril (ZESTRIL) 20 MG tablet Take 1 tablet (20 mg total) by mouth daily. 90 tablet 2  . metFORMIN (GLUCOPHAGE-XR) 500 MG 24 hr tablet Take 500 mg by mouth daily with breakfast.   12  . Nicotine 21-14-7 MG/24HR KIT Use as directed 1 kit 0  . nitroGLYCERIN (NITROSTAT) 0.4 MG SL tablet Place 1 tablet (0.4 mg total) under the tongue every 5 (five) minutes x 3 doses as needed for chest pain. 25 tablet 4  . pantoprazole (PROTONIX) 40 MG tablet TAKE 1 TABLET BY MOUTH EVERY DAY (Patient taking differently: Take 40 mg by mouth daily. ) 90 tablet 2  . Potassium Citrate 15 MEQ (1620 MG) TBCR Take 1 tablet by mouth in the morning and at bedtime.    . topiramate (TOPAMAX) 25 MG tablet Take 3 tablets (75 mg total) by mouth 2 (two) times daily. 540 tablet 1   No facility-administered medications prior to visit.    PAST MEDICAL HISTORY: Past Medical History:  Diagnosis Date  . Carotid artery stenosis    1-39% bilateral stenosis by dopplers 12/2017  . Coronary artery disease    a. s/p CABG 10/2012.  cath 05/2016 with moderate LM stenosis, mild LCx and RCA stenosis and occluded LAD with patent LIMA to LAD, free radial to OM.  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  Marland Kitchen Dyslipidemia   . Family history of adverse reaction to anesthesia    mother and sister ponv  . Generalized convulsive epilepsy without mention of intractable epilepsy   . GERD (gastroesophageal reflux disease)   . History of kidney stones   . Hypertension   . Memory deficit    slight  . Migraine    "controlled on daily RX " (06/11/2016)  . Nephrolithiasis 07/30/2015   S/p lithotripsy x 3 and right and left ureter stents  . OSA (obstructive sleep apnea)    cpap   . PONV (postoperative nausea and vomiting)   . Seizures  ([place])    "controlled w/daily RX; started in 2012; dr thinks they might be from the migraines; last sz was early part of 2016" (06/11/2016)  . Type II diabetes mellitus (HCC)    metformin  . Vasovagal syncope 07/30/2015   2011    PAST SURGICAL HISTORY: Past Surgical History:  Procedure Laterality Date  . CARDIAC CATHETERIZATION  2014; 06/11/2016  . CARDIAC CATHETERIZATION N/A 06/11/2016   Procedure: Left Heart Cath and Cors/Grafts Angiography;  Surgeon: Sherren Mocha, MD;  Location: [facility];  Service: Cardiovascular;  Laterality: N/A;  . CESAREAN SECTION    . CORONARY ANGIOPLASTY    . CORONARY ARTERY BYPASS GRAFT  March, 2014   LIMA to LAD, left  radial to LCx x 2  . CYSTOSCOPY W/ URETERAL STENT PLACEMENT Left 01/16/2019   Procedure: CYSTOSCOPY WITH RETROGRADE PYELOGRAM/URETERAL LEFT STENT PLACEMENT;  Surgeon: Ardis Hughs, MD;  Location: [facility];  Service: Urology;  Laterality: Left;  . CYSTOSCOPY/URETEROSCOPY/HOLMIUM LASER/STENT PLACEMENT Left 01/24/2019   Procedure: CYSTOSCOPY, LEFT URETEROSCOPY, HOLMIUM LASER, STONE REMOVAL AND STENT EXCHANGE;  Surgeon: Ardis Hughs, MD;  Location: [facility];  Service: Urology;  Laterality: Left;  . LITHOTRIPSY  X 3  . RIGHT/LEFT HEART CATH AND CORONARY/GRAFT ANGIOGRAPHY N/A 09/08/2018   Procedure: RIGHT/LEFT HEART CATH AND CORONARY/GRAFT ANGIOGRAPHY;  Surgeon: Martinique, Peter M, MD;  Location: [facility];  Service: Cardiovascular;  Laterality: N/A;  . TOTAL ABDOMINAL HYSTERECTOMY     ovaries took before hysterectomy  . TUBAL LIGATION    . URETERAL STENT PLACEMENT      FAMILY HISTORY: Family History  Problem Relation Age of Onset  . Cancer Father   . Diabetes Father   . Diabetes Brother   . Stroke Brother   . Diabetes Brother   . Heart disease Brother        CAD with CABG  . Heart disease Sister        CAD with MI  . Heart attack Sister     SOCIAL HISTORY: Social History   Socioeconomic History  . Marital  status: Divorced    Spouse name: Not on file  . Number of children: 2  . Years of education: 23  . Highest education level: Not on file  Occupational History  . Occupation: retired  Tobacco Use  . Smoking status: Current Some Day Smoker    Packs/day: 0.33    Years: 35.00    Pack years: 11.55    Types: Cigarettes    Last attempt to quit: 06/11/2016    Years since quitting: 3.8  . Smokeless tobacco: Former Systems developer    Types: Snuff, Sarina Ser    Quit date: 11/20/2012  Vaping Use  . Vaping Use: Never used  Substance and Sexual Activity  . Alcohol use: Yes    Comment: 06/11/2016 "might have a few drinks/month; usually wine"  . Drug use: No  . Sexual activity: Not Currently  Other Topics Concern  . Not on file  Social History Narrative   Patient drinks about 1 cup of coffee daily.   Patient is right handed.    Social Determinants of Health   Financial Resource Strain:   . Difficulty of Paying Living Expenses:   Food Insecurity:   . Worried About Charity fundraiser in the Last Year:   . Arboriculturist in the Last Year:   Transportation Needs:   . Film/video editor (Medical):   Marland Kitchen Lack of Transportation (Non-Medical):   Physical Activity:   . Days of Exercise per Week:   . Minutes of Exercise per Session:   Stress:   . Feeling of Stress :   Social Connections:   . Frequency of Communication with Friends and Family:   . Frequency of Social Gatherings with Friends and Family:   . Attends Religious Services:   . Active Member of Clubs or Organizations:   . Attends Archivist Meetings:   Marland Kitchen Marital Status:   Intimate Partner Violence:   . Fear of Current or Ex-Partner:   . Emotionally Abused:   Marland Kitchen Physically Abused:   . Sexually Abused:    PHYSICAL EXAM  Vitals:   04/03/20 0827  BP: (!) 154/67  Pulse: 61  Weight: 141 lb (64 kg)  Height: _0  (1.549 m)   Body mass index is 26.64 kg/m.  Generalized: Well developed, in no acute distress  MMSE - Mini  Mental State Exam 04/03/2020 04/03/2019  Orientation to time 5 5  Orientation to Place 5 5  Registration 3 3  Attention/ Calculation 5 5  Recall 3 3  Language- name 2 objects 2 2  Language- repeat 1 1  Language- follow 3 step command 3 3  Language- read & follow direction 1 1  Write a sentence 1 1  Copy design 1 1  Copy design-comments - named 5 animals  Total score 30 30    Neurological examination  Mentation: Alert oriented to time, place, history taking. Follows all commands speech and language fluent Cranial nerve II-XII: Pupils were equal round reactive to light. Extraocular movements were full, visual field were full on confrontational test. Facial sensation and strength were normal. Head turning and shoulder shrug  were normal and symmetric. Motor: The motor testing reveals 5 over 5 strength of all 4 extremities. Good symmetric motor tone is noted throughout.  Sensory: Sensory testing is intact to soft touch on all 4 extremities. No evidence of extinction is noted.  Coordination: Cerebellar testing reveals good finger-nose-finger and heel-to-shin bilaterally.  Gait and station: Gait is normal. Tandem gait is slightly unsteady. Romberg is negative. No drift is seen.  Reflexes: Deep tendon reflexes are symmetric and normal bilaterally.   DIAGNOSTIC DATA (LABS, IMAGING, TESTING) - I reviewed patient records, labs, notes, testing and imaging myself where available.  Lab Results  Component Value Date   WBC 5.3 01/09/2020   HGB 11.1 (L) 01/09/2020   HCT 34.4 (L) 01/09/2020   MCV 96.4 01/09/2020   PLT 166 01/09/2020      Component Value Date/Time   NA 139 01/09/2020 0405   NA 143 09/04/2018 1306   K 4.0 01/09/2020 0405   CL 111 01/09/2020 0405   CO2 18 (L) 01/09/2020 0405   GLUCOSE 107 (H) 01/09/2020 0405   BUN 11 01/09/2020 0405   BUN 26 09/04/2018 1306   CREATININE 0.76 01/09/2020 0405   CALCIUM 9.2 01/09/2020 0405   PROT 6.6 01/09/2020 0405   PROT 7.4 09/15/2015 1026    ALBUMIN 3.1 (L) 01/09/2020 0405   ALBUMIN 4.4 09/15/2015 1026   AST 57 (H) 01/09/2020 0405   ALT 72 (H) 01/09/2020 0405   ALKPHOS 65 01/09/2020 0405   BILITOT 0.5 01/09/2020 0405   BILITOT 0.3 09/15/2015 1026   GFRNONAA >60 01/09/2020 0405   GFRAA >60 01/09/2020 0405   Lab Results  Component Value Date   CHOL 79 01/09/2020   HDL 26 (L) 01/09/2020   LDLCALC 40 01/09/2020   LDLDIRECT 67 06/13/2018   TRIG 64 01/09/2020   CHOLHDL 3.0 01/09/2020   Lab Results  Component Value Date   HGBA1C 7.6 (H) 01/05/2020   Lab Results  Component Value Date   VOPFYTWK46 286 03/22/2013   Lab Results  Component Value Date   TSH 0.959 08/14/2018   ASSESSMENT AND PLAN 64 y.o. year old female  has a past medical history of Carotid artery stenosis, Coronary artery disease, Dyslipidemia, Family history of adverse reaction to anesthesia, Generalized convulsive epilepsy without mention of intractable epilepsy, GERD (gastroesophageal reflux disease), History of kidney stones, Hypertension, Memory deficit, Migraine, Nephrolithiasis (07/30/2015), OSA (obstructive sleep apnea), PONV (postoperative nausea and vomiting), Seizures ([place]), Type II diabetes mellitus ([place]), and Vasovagal syncope (07/30/2015). here with:  1.  Seizures, well controlled 2.  Migraine headaches 3.  History of kidney stones -No recurrent seizure, migraines well controlled -Due to history of reported kidney stones, will reach out to urology, Dr. Diona Fanti, to get an opinion if Topamax should be discontinued at this point  -For now, will remain on Keppra 500 mg twice a day, Topamax 75 mg twice a day -Follow-up in 6 months or sooner if needed  4.  Reported memory disturbance -Overall, stable, MMSE 30/30  I spent 30 minutes of face-to-face and non-face-to-face time with patient.  This included previsit chart review, lab review, study review, order entry, electronic health record documentation, patient education.  Addendum 04/15/2020  SS: Did reach out to Dr. Diona Fanti about opinion on continuing Topamax in setting of history of kidney stones, he felt no indication to change medication therapy at this point.   Butler Denmark, AGNP-C, DNP 04/03/2020, 8:39 AM [facility] [address] [place], [place] [address] [phone]

## 2020-04-03 NOTE — Progress Notes (Signed)
I have read the note, and I agree with the clinical assessment and plan.  Jazelyn Sipe K Helyn Schwan   

## 2020-04-11 ENCOUNTER — Other Ambulatory Visit: Payer: Self-pay | Admitting: Cardiology

## 2020-08-18 ENCOUNTER — Other Ambulatory Visit: Payer: Self-pay

## 2020-08-18 ENCOUNTER — Ambulatory Visit: Payer: Medicare HMO | Admitting: Cardiology

## 2020-08-18 ENCOUNTER — Encounter: Payer: Self-pay | Admitting: Cardiology

## 2020-08-18 VITALS — BP 152/82 | HR 80 | Ht 61.0 in | Wt 145.8 lb

## 2020-08-18 DIAGNOSIS — I2510 Atherosclerotic heart disease of native coronary artery without angina pectoris: Secondary | ICD-10-CM

## 2020-08-18 DIAGNOSIS — R0602 Shortness of breath: Secondary | ICD-10-CM

## 2020-08-18 DIAGNOSIS — E785 Hyperlipidemia, unspecified: Secondary | ICD-10-CM

## 2020-08-18 DIAGNOSIS — I10 Essential (primary) hypertension: Secondary | ICD-10-CM

## 2020-08-18 DIAGNOSIS — E119 Type 2 diabetes mellitus without complications: Secondary | ICD-10-CM

## 2020-08-18 DIAGNOSIS — I6523 Occlusion and stenosis of bilateral carotid arteries: Secondary | ICD-10-CM

## 2020-08-18 DIAGNOSIS — G4733 Obstructive sleep apnea (adult) (pediatric): Secondary | ICD-10-CM

## 2020-08-18 LAB — COMPREHENSIVE METABOLIC PANEL
AG Ratio: 1.6 (calc) (ref 1.0–2.5)
ALT: 13 U/L (ref 6–29)
AST: 15 U/L (ref 10–35)
Albumin: 4.4 g/dL (ref 3.6–5.1)
Alkaline phosphatase (APISO): 135 U/L (ref 37–153)
BUN: 13 mg/dL (ref 7–25)
CO2: 26 mmol/L (ref 20–32)
Calcium: 9.6 mg/dL (ref 8.6–10.4)
Chloride: 109 mmol/L (ref 98–110)
Creat: 0.87 mg/dL (ref 0.50–0.99)
Globulin: 2.8 g/dL (calc) (ref 1.9–3.7)
Glucose, Bld: 122 mg/dL — ABNORMAL HIGH (ref 65–99)
Potassium: 4.3 mmol/L (ref 3.5–5.3)
Sodium: 142 mmol/L (ref 135–146)
Total Bilirubin: 0.4 mg/dL (ref 0.2–1.2)
Total Protein: 7.2 g/dL (ref 6.1–8.1)

## 2020-08-18 LAB — LIPID PANEL
Cholesterol: 117 mg/dL (ref <200)
HDL: 55 mg/dL (ref >=50)
LDL Cholesterol (Calc): 44 mg/dL (calc)
Non-HDL Cholesterol (Calc): 62 mg/dL (calc) (ref <130)
Total CHOL/HDL Ratio: 2.1 (calc) (ref <5.0)
Triglycerides: 99 mg/dL (ref <150)

## 2020-08-18 MED ORDER — ATORVASTATIN CALCIUM 80 MG PO TABS: 80.0000 mg | ORAL_TABLET | Freq: Every day | ORAL | 2 refills | Status: DC

## 2020-08-18 MED ORDER — LISINOPRIL 20 MG PO TABS: 20.0000 mg | ORAL_TABLET | Freq: Every day | ORAL | 2 refills | Status: DC

## 2020-08-18 MED ORDER — CARVEDILOL 12.5 MG PO TABS: 12.5000 mg | ORAL_TABLET | Freq: Two times a day (BID) | ORAL | 2 refills | Status: DC

## 2020-08-18 MED ORDER — CLOPIDOGREL BISULFATE 75 MG PO TABS: 75.0000 mg | ORAL_TABLET | Freq: Every day | ORAL | 2 refills | Status: DC

## 2020-08-18 MED ORDER — EZETIMIBE 10 MG PO TABS: 10.0000 mg | ORAL_TABLET | Freq: Every day | ORAL | 3 refills | Status: DC

## 2020-08-18 MED ORDER — ISOSORBIDE MONONITRATE ER 120 MG PO TB24: 120.0000 mg | ORAL_TABLET | Freq: Every day | ORAL | 2 refills | Status: DC

## 2020-08-18 MED ORDER — PRALUENT 75 MG/ML ~~LOC~~ SOAJ: SUBCUTANEOUS | 11 refills | Status: DC

## 2020-08-18 NOTE — Progress Notes (Signed)
Date:  08/18/2020   ID:  Samantha Huffman, DOB [birth year], MRN [id number]  PCP:  Alroy Dust, Carlean Jews.Marlou Sa, MD  Cardiologist:  Fransico Him, MD  Electrophysiologist:  None   Chief Complaint:  CAD, Carotid stenosis, HLD, HTN  History of Present Illness:    Samantha Huffman is a 64 y.o. female with a history of CAD status post CABG 10/2012 in [place], abnormal stress test 2017 but cath showed patent grafts to the OM and LAD and 30% RCA normal LV function. Patient has had periodic chest pain and was placed on Imdur for possible microvascular angina. PCP was treating her for anxiety. 2D echo 07/2018 EF 60 to 65% with grade 1 DD.   Patient underwent cardiac catheterization 09/04/2018 due to ongoing CP and found to have patent LIMA to the LAD, patent SVGs and radial graft to the OM, normal LV function, low LV filling pressures. Chlorthalidone and Lasix were held because of low filling pressures and blood pressure otherwise medical therapy continued. She also has HLD, HTN, DM2, OSA on CPAP and carotid artery dz as well as tobacco abuse.  She is here today for followup and is doing well.  She has chronic DOE when cleaning and walking that has not changed since I saw her last.  She also gets tired when she walks but that is stable.  She denies any chest pain or pressure, PND, orthopnea, LE edema, dizziness, palpitations or syncope. She is compliant with her meds and is tolerating meds with no SE.    She is doing well with her CPAP device but recently stopped using it because her nose was getting sores and had to stop using it.  She was using a nasal pillow mask.  Since going on CPAP she feels rested in the am and has no significant daytime sleepiness.  She denies any significant mouth or nasal dryness or nasal congestion.  She does not think that she snores.     Prior CV studies:   The following studies were reviewed today:  PAP compliance download  Past Medical History:  Diagnosis Date  . Carotid artery  stenosis    1-39% bilateral stenosis by dopplers 12/2017  . Coronary artery disease    a. s/p CABG 10/2012.  cath 05/2016 with moderate LM stenosis, mild LCx and RCA stenosis and occluded LAD with patent LIMA to LAD, free radial to OM.  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  Marland Kitchen Dyslipidemia   . Family history of adverse reaction to anesthesia    mother and sister ponv  . Generalized convulsive epilepsy without mention of intractable epilepsy   . GERD (gastroesophageal reflux disease)   . History of kidney stones   . Hypertension   . Memory deficit    slight  . Migraine    "controlled on daily RX " (06/11/2016)  . Nephrolithiasis 07/30/2015   S/p lithotripsy x 3 and right and left ureter stents  . OSA (obstructive sleep apnea)    cpap   . PONV (postoperative nausea and vomiting)   . Seizures ([place])    "controlled w/daily RX; started in 2012; dr thinks they might be from the migraines; last sz was early part of 2016" (06/11/2016)  . Type II diabetes mellitus (HCC)    metformin  . Vasovagal syncope 07/30/2015   2011   Past Surgical History:  Procedure Laterality Date  . CARDIAC CATHETERIZATION  2014; 06/11/2016  . CARDIAC CATHETERIZATION N/A 06/11/2016   Procedure: Left Heart Cath and Cors/Grafts  Angiography;  Surgeon: Sherren Mocha, MD;  [facility];  Service: Cardiovascular;  Laterality: N/A;  . CESAREAN SECTION    . CORONARY ANGIOPLASTY    . CORONARY ARTERY BYPASS GRAFT  March, 2014   LIMA to LAD, left radial to LCx x 2  . CYSTOSCOPY W/ URETERAL STENT PLACEMENT Left 01/16/2019   Procedure: CYSTOSCOPY WITH RETROGRADE PYELOGRAM/URETERAL LEFT STENT PLACEMENT;  Surgeon: Ardis Hughs, MD;  Location: [facility];  Service: Urology;  Laterality: Left;  . CYSTOSCOPY/URETEROSCOPY/HOLMIUM LASER/STENT PLACEMENT Left 01/24/2019   Procedure: CYSTOSCOPY, LEFT URETEROSCOPY, HOLMIUM LASER, STONE REMOVAL AND STENT EXCHANGE;  Surgeon: Ardis Hughs, MD;  Location: [facility];   Service: Urology;  Laterality: Left;  . LITHOTRIPSY  X 3  . RIGHT/LEFT HEART CATH AND CORONARY/GRAFT ANGIOGRAPHY N/A 09/08/2018   Procedure: RIGHT/LEFT HEART CATH AND CORONARY/GRAFT ANGIOGRAPHY;  Surgeon: Martinique, Peter M, MD;  Location: [facility];  Service: Cardiovascular;  Laterality: N/A;  . TOTAL ABDOMINAL HYSTERECTOMY     ovaries took before hysterectomy  . TUBAL LIGATION    . URETERAL STENT PLACEMENT       Current Meds  Medication Sig  . acetaminophen (TYLENOL) 325 MG tablet Take 2 tablets (650 mg total) by mouth every 4 (four) hours as needed for headache or mild pain.  Marland Kitchen ALPRAZolam (XANAX) 0.25 MG tablet Take 0.25 mg by mouth 3 (three) times daily as needed for anxiety.   Marland Kitchen aspirin EC 81 MG tablet Take 81 mg by mouth daily.  Marland Kitchen atorvastatin (LIPITOR) 80 MG tablet Take 1 tablet (80 mg total) by mouth daily.  . carvedilol (COREG) 12.5 MG tablet Take 1 tablet (12.5 mg total) by mouth 2 (two) times daily.  . clopidogrel (PLAVIX) 75 MG tablet Take 1 tablet (75 mg total) by mouth daily.  . clotrimazole-betamethasone (LOTRISONE) cream Apply 1 application topically daily as needed (skin folds (under breasts)).   Marland Kitchen escitalopram (LEXAPRO) 10 MG tablet Take 10 mg by mouth at bedtime.   Marland Kitchen ezetimibe (ZETIA) 10 MG tablet TAKE 1 TABLET (10 MG TOTAL) BY MOUTH DAILY. (Patient taking differently: Take 10 mg by mouth at bedtime.)  . furosemide (LASIX) 20 MG tablet 1 tablet  . isosorbide mononitrate (IMDUR) 120 MG 24 hr tablet Take 1 tablet (120 mg total) by mouth daily.  Marland Kitchen levETIRAcetam (KEPPRA) 500 MG tablet Take 1 tablet (500 mg total) by mouth 2 (two) times daily.  Marland Kitchen lisinopril (ZESTRIL) 20 MG tablet Take 1 tablet (20 mg total) by mouth daily.  . metFORMIN (GLUCOPHAGE-XR) 500 MG 24 hr tablet Take 500 mg by mouth daily with breakfast.   . Nicotine 21-14-7 MG/24HR KIT Use as directed  . nitroGLYCERIN (NITROSTAT) 0.4 MG SL tablet Place 1 tablet (0.4 mg total) under the tongue every 5 (five)  minutes x 3 doses as needed for chest pain.  . pantoprazole (PROTONIX) 40 MG tablet TAKE 1 TABLET BY MOUTH EVERY DAY  . Potassium Citrate 15 MEQ (1620 MG) TBCR Take 1 tablet by mouth in the morning and at bedtime.  Marland Kitchen PRALUENT 75 MG/ML SOAJ INJECT CONTENTS OF 1 PEN INTO THE SKIN EVERY 14 DAYS  . topiramate (TOPAMAX) 25 MG tablet Take 3 tablets (75 mg total) by mouth 2 (two) times daily.     Allergies:   Depakote [divalproex sodium], Other, and Penicillins   Social History   Tobacco Use  . Smoking status: Current Some Day Smoker    Packs/day: 0.33    Years: 35.00  Pack years: 11.55    Types: Cigarettes    Last attempt to quit: 06/11/2016    Years since quitting: 4.1  . Smokeless tobacco: Former Systems developer    Types: Snuff, Sarina Ser    Quit date: 11/20/2012  Vaping Use  . Vaping Use: Never used  Substance Use Topics  . Alcohol use: Yes    Comment: 06/11/2016 "might have a few drinks/month; usually wine"  . Drug use: No     Family Hx: The patient's family history includes Cancer in her father; Diabetes in her brother, brother, and father; Heart attack in her sister; Heart disease in her brother and sister; Stroke in her brother.  ROS:   Please see the history of present illness.     All other systems reviewed and are negative.   Labs/Other Tests and Data Reviewed:    Recent Labs: 01/09/2020: ALT 72; BUN 11; Creatinine, Ser 0.76; Hemoglobin 11.1; Magnesium 2.0; Platelets 166; Potassium 4.0; Sodium 139   Recent Lipid Panel Lab Results  Component Value Date/Time   CHOL 79 01/09/2020 04:05 AM   CHOL 136 03/12/2019 10:43 AM   TRIG 64 01/09/2020 04:05 AM   HDL 26 (L) 01/09/2020 04:05 AM   HDL 46 03/12/2019 10:43 AM   CHOLHDL 3.0 01/09/2020 04:05 AM   LDLCALC 40 01/09/2020 04:05 AM   LDLCALC 35 06/18/2019 11:19 AM   LDLDIRECT 67 06/13/2018 03:17 PM    Wt Readings from Last 3 Encounters:  08/18/20 145 lb 12.8 oz (66.1 kg)  04/03/20 141 lb (64 kg)  02/19/20 138 lb (62.6 kg)      Objective:    Vital Signs:  BP (!) 152/82   Pulse 80   Ht '5\' 1"'  (1.549 m)   Wt 145 lb 12.8 oz (66.1 kg)   SpO2 99%   BMI 27.55 kg/m    GEN: Well nourished, well developed in no acute distress HEENT: Normal NECK: No JVD; No carotid bruits LYMPHATICS: No lymphadenopathy CARDIAC:RRR, no murmurs, rubs, gallops RESPIRATORY:  Clear to auscultation without rales, wheezing or rhonchi  ABDOMEN: Soft, non-tender, non-distended MUSCULOSKELETAL:  No edema; No deformity  SKIN: Warm and dry NEUROLOGIC:  Alert and oriented x 3 PSYCHIATRIC:  Normal affect   ASSESSMENT & PLAN:    1. ASCAD  -CABG x2 in 2014 with LIMA to the LAD and free radial to the OM with recent cath 09/04/2018 with patent LIMA to the LAD and patent free radial graft to the OM.  -she denies any CP and her SOB is stable and unchanged since I saw her last. -continue ASA, Praluent, BB, Atorvastatin 10m daily, Plavix and long acting nitrate.   2. HTN  -BP borderline controlled on exam today but has not taken her BP meds yet -Continue Lisinopril 28mdaily, Imdur 12067maily, Carvedilol 12.5mg36mD -Check CMET  3. Hyperlipidemia  -LDL goal is < 70.  -continue Praluent, Atorvastatin 80mg70mly and Zetia 10mg 39my  4. Carotid artery stenosis  -dopplers 12/2017 showed 1-39% bilateral stenosis  -repeat dopplers to make sure they are stable -continue ASA and statin.   5. Type 2 DM  -this is followed by her PCP  -HbA1C was 7.4 in Nov 2021 -continue Metformin XR 500mg d103m   6.  OSA - The patient is tolerating PAP therapy well without any problems.  -The patient has been using and benefiting from PAP use and will continue to benefit from therapy.  -change from nasal pillow mask with the prongs to a nasal pillow that  sits under her nose to help with her nasal sores -encouraged her to be compliant with her device  7.  SOB -this is chronic but she gets SOB even with ADLs and walking and has not changed since I saw  her last -cath 08/2018 showed patent grafts and SOB has not changed since then -LVF is normal -she started smoking again after her brother died>>encouraged her to quit   Medication Adjustments/Labs and Tests Ordered: Current medicines are reviewed at length with the patient today.  Concerns regarding medicines are outlined above.  Tests Ordered: No orders of the defined types were placed in this encounter.  Medication Changes: No orders of the defined types were placed in this encounter.   Disposition:  Follow up in 1 year(s)  Signed, Fransico Him, MD  08/18/2020 8:50 AM    [facility]

## 2020-08-18 NOTE — Addendum Note (Signed)
Addended by: Antonieta Iba on: 08/18/2020 09:06 AM   Modules accepted: Orders

## 2020-08-18 NOTE — Patient Instructions (Signed)
Medication Instructions:  Your physician recommends that you continue on your current medications as directed. Please refer to the Current Medication list given to you today.  *If you need a refill on your cardiac medications before your next appointment, please call your pharmacy*   Lab Work: TODAY: CMET, lipids If you have labs (blood work) drawn today and your tests are completely normal, you will receive your results only by: Marland Kitchen MyChart Message (if you have MyChart) OR . A paper copy in the mail If you have any lab test that is abnormal or we need to change your treatment, we will call you to review the results.   Testing/Procedures: Your physician has requested that you have a carotid duplex. This test is an ultrasound of the carotid arteries in your neck. It looks at blood flow through these arteries that supply the brain with blood. Allow one hour for this exam. There are no restrictions or special instructions.  Follow-Up: At [facility], you and your health needs are our priority.  As part of our continuing mission to provide you with exceptional heart care, we have created designated Provider Care Teams.  These Care Teams include your primary Cardiologist (physician) and Advanced Practice Providers (APPs -  Physician Assistants and Nurse Practitioners) who all work together to provide you with the care you need, when you need it.  Your next appointment:   1 year(s)  The format for your next appointment:   In Person  Provider:   You may see Fransico Him, MD or one of the following Advanced Practice Providers on your designated Care Team:    Melina Copa, PA-C  Ermalinda Barrios, PA-C

## 2020-08-18 NOTE — Addendum Note (Signed)
Addended by: Theresia Majors on: 08/18/2020 09:10 AM   Modules accepted: Orders

## 2020-08-19 ENCOUNTER — Telehealth: Payer: Self-pay | Admitting: *Deleted

## 2020-08-19 DIAGNOSIS — G4733 Obstructive sleep apnea (adult) (pediatric): Secondary | ICD-10-CM

## 2020-08-19 NOTE — Telephone Encounter (Signed)
Order placed to [facility] via fax.

## 2020-08-19 NOTE — Telephone Encounter (Signed)
-----   Message from Quintella Reichert, MD sent at 08/18/2020  5:09 PM EST ----- Please order a nasal cushion mask

## 2020-09-02 ENCOUNTER — Other Ambulatory Visit: Payer: Self-pay

## 2020-09-02 ENCOUNTER — Ambulatory Visit (HOSPITAL_COMMUNITY)
Admission: RE | Admit: 2020-09-02 | Discharge: 2020-09-02 | Disposition: A | Discharge status: Home / Self Care | Payer: Medicare HMO | Source: Ambulatory Visit | Attending: Cardiovascular Disease | Admitting: Cardiovascular Disease

## 2020-09-02 DIAGNOSIS — I6523 Occlusion and stenosis of bilateral carotid arteries: Secondary | ICD-10-CM | POA: Diagnosis present

## 2020-09-03 ENCOUNTER — Encounter: Payer: Self-pay | Admitting: Cardiology

## 2020-10-06 ENCOUNTER — Ambulatory Visit: Payer: Medicare HMO | Admitting: Neurology

## 2020-10-06 ENCOUNTER — Encounter: Payer: Self-pay | Admitting: Neurology

## 2020-10-06 ENCOUNTER — Other Ambulatory Visit: Payer: Self-pay

## 2020-10-06 VITALS — BP 190/87 | HR 81 | Ht 61.0 in | Wt 143.0 lb

## 2020-10-06 DIAGNOSIS — G441 Vascular headache, not elsewhere classified: Secondary | ICD-10-CM

## 2020-10-06 DIAGNOSIS — G40309 Generalized idiopathic epilepsy and epileptic syndromes, not intractable, without status epilepticus: Secondary | ICD-10-CM | POA: Diagnosis not present

## 2020-10-06 MED ORDER — TOPIRAMATE 25 MG PO TABS: 75.0000 mg | ORAL_TABLET | Freq: Two times a day (BID) | ORAL | 4 refills | Status: DC

## 2020-10-06 MED ORDER — LEVETIRACETAM 500 MG PO TABS: 500.0000 mg | ORAL_TABLET | Freq: Two times a day (BID) | ORAL | 3 refills | Status: DC

## 2020-10-06 NOTE — Progress Notes (Signed)
PATIENT: Samantha Huffman DOB: 1956/05/01  REASON FOR VISIT: follow up HISTORY FROM: patient  HISTORY OF PRESENT ILLNESS: Today 10/06/20 Ms. Archbold is a 65 year old female with history of migraines and seizure disorder that is well controlled on Topamax and Keppra.  Has reported mild memory disturbance.  Has history of kidney stones, reached out to urology after last visit, no indication to change medication therapy.  No further kidney stones since last seen, does have 1 known, but not problematic.  MMSE 30/30 in Aug 2021, memory is stable, she lives alone, her kids grocery shop with her, helps her stay on track, also help with her bills.  Sleeps fairly well, wears CPAP.  No recent seizures, or migraines. 1 time had sharp pain to right side while having conflict with her sister, dissipated, she knows if it goes to her left side she will have seizure historically.  BP up today, didn't take BP meds yet, salty food last night for super bowl. Here today for evaluation unaccompanied.  HISTORY 04/03/2020 SS: Ms. Groeneveld 65 year old female history of migraines and seizure disorder that is well controlled on Topamax and Keppra.  Has reported mild memory disturbance. Has had some history of kidney stones.  She was hospitalized in May for pneumonia. No seizures since last seen, headaches remain well-controlled. Seeing Dr. Diona Fanti with urology.  Was seen recently, for kidney stone follow-up, hematuria, there was no blood in the urine.  Tolerating medications well.  She says-history of kidney stones.  Question of whether Topamax needs to be discontinued?  She lives alone, drives a car.  Her children go to the grocery store with her.  Memory is overall stablem 30/30 today.  No falls, she may lose her balance at times.  Presents today for evaluation unaccompanied.   REVIEW OF SYSTEMS: Out of a complete 14 system review of symptoms, the patient complains only of the following symptoms, and all other reviewed systems  are negative.  See HPI  ALLERGIES: Allergies  Allergen Reactions  . Depakote [Divalproex Sodium] Other (See Comments)    Hyperammonemia  . Other Other (See Comments)  . Penicillins Hives    Other reaction(s): GI intolerance Has patient had a PCN reaction causing immediate rash, facial/tongue/throat swelling, SOB or lightheadedness with hypotension: YES Has patient had a PCN reaction causing severe rash involving mucus membranes or skin necrosis: NO Has patient had a PCN reaction that required hospitalizationNO Has patient had a PCN reaction occurring within the last 10 years: NO If all of the above answers are "NO", then may proceed with Cephalosporin use.    HOME MEDICATIONS: Outpatient Medications Prior to Visit  Medication Sig Dispense Refill  . acetaminophen (TYLENOL) 325 MG tablet Take 2 tablets (650 mg total) by mouth every 4 (four) hours as needed for headache or mild pain.    . Alirocumab (PRALUENT) 75 MG/ML SOAJ INJECT CONTENTS OF 1 PEN INTO THE SKIN EVERY 14 DAYS 2 mL 11  . ALPRAZolam (XANAX) 0.25 MG tablet Take 0.25 mg by mouth 3 (three) times daily as needed for anxiety.   0  . aspirin EC 81 MG tablet Take 81 mg by mouth daily.    Marland Kitchen atorvastatin (LIPITOR) 80 MG tablet Take 1 tablet (80 mg total) by mouth daily. 90 tablet 2  . carvedilol (COREG) 12.5 MG tablet Take 1 tablet (12.5 mg total) by mouth 2 (two) times daily. 180 tablet 2  . clopidogrel (PLAVIX) 75 MG tablet Take 1 tablet (75 mg total) by mouth  daily. 90 tablet 2  . clotrimazole-betamethasone (LOTRISONE) cream Apply 1 application topically daily as needed (skin folds (under breasts)).     Marland Kitchen escitalopram (LEXAPRO) 10 MG tablet Take 10 mg by mouth at bedtime.     Marland Kitchen ezetimibe (ZETIA) 10 MG tablet Take 1 tablet (10 mg total) by mouth at bedtime. 90 tablet 3  . furosemide (LASIX) 20 MG tablet 1 tablet    . isosorbide mononitrate (IMDUR) 120 MG 24 hr tablet Take 1 tablet (120 mg total) by mouth daily. 90 tablet 2  .  levETIRAcetam (KEPPRA) 500 MG tablet Take 1 tablet (500 mg total) by mouth 2 (two) times daily. 180 tablet 3  . lisinopril (ZESTRIL) 20 MG tablet Take 1 tablet (20 mg total) by mouth daily. 90 tablet 2  . metFORMIN (GLUCOPHAGE-XR) 500 MG 24 hr tablet Take 500 mg by mouth daily with breakfast.   12  . Nicotine 21-14-7 MG/24HR KIT Use as directed 1 kit 0  . nitroGLYCERIN (NITROSTAT) 0.4 MG SL tablet Place 1 tablet (0.4 mg total) under the tongue every 5 (five) minutes x 3 doses as needed for chest pain. 25 tablet 4  . pantoprazole (PROTONIX) 40 MG tablet TAKE 1 TABLET BY MOUTH EVERY DAY 90 tablet 2  . Potassium Citrate 15 MEQ (1620 MG) TBCR Take 1 tablet by mouth in the morning and at bedtime.    . topiramate (TOPAMAX) 25 MG tablet Take 3 tablets (75 mg total) by mouth 2 (two) times daily. 540 tablet 4   No facility-administered medications prior to visit.    PAST MEDICAL HISTORY: Past Medical History:  Diagnosis Date  . Carotid artery stenosis    1-39% bilateral stenosis by dopplers 08/2020  . Coronary artery disease    a. s/p CABG 10/2012.  cath 05/2016 with moderate LM stenosis, mild LCx and RCA stenosis and occluded LAD with patent LIMA to LAD, free radial to OM.  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  Marland Kitchen Dyslipidemia   . Family history of adverse reaction to anesthesia    mother and sister ponv  . Generalized convulsive epilepsy without mention of intractable epilepsy   . GERD (gastroesophageal reflux disease)   . History of kidney stones   . Hypertension   . Memory deficit    slight  . Migraine    "controlled on daily RX " (06/11/2016)  . Nephrolithiasis 07/30/2015   S/p lithotripsy x 3 and right and left ureter stents  . OSA (obstructive sleep apnea)    cpap   . PONV (postoperative nausea and vomiting)   . Seizures ([place])    "controlled w/daily RX; started in 2012; dr thinks they might be from the migraines; last sz was early part of 2016" (06/11/2016)  . Type II diabetes  mellitus (HCC)    metformin  . Vasovagal syncope 07/30/2015   2011    PAST SURGICAL HISTORY: Past Surgical History:  Procedure Laterality Date  . CARDIAC CATHETERIZATION  2014; 06/11/2016  . CARDIAC CATHETERIZATION N/A 06/11/2016   Procedure: Left Heart Cath and Cors/Grafts Angiography;  Surgeon: Sherren Mocha, MD;  Location: [facility];  Service: Cardiovascular;  Laterality: N/A;  . CESAREAN SECTION    . CORONARY ANGIOPLASTY    . CORONARY ARTERY BYPASS GRAFT  March, 2014   LIMA to LAD, left radial to LCx x 2  . CYSTOSCOPY W/ URETERAL STENT PLACEMENT Left 01/16/2019   Procedure: CYSTOSCOPY WITH RETROGRADE PYELOGRAM/URETERAL LEFT STENT PLACEMENT;  Surgeon: Ardis Hughs, MD;  Location: [facility];  Service: Urology;  Laterality: Left;  . CYSTOSCOPY/URETEROSCOPY/HOLMIUM LASER/STENT PLACEMENT Left 01/24/2019   Procedure: CYSTOSCOPY, LEFT URETEROSCOPY, HOLMIUM LASER, STONE REMOVAL AND STENT EXCHANGE;  Surgeon: Crist Fat, MD;  Location: [facility];  Service: Urology;  Laterality: Left;  . LITHOTRIPSY  X 3  . RIGHT/LEFT HEART CATH AND CORONARY/GRAFT ANGIOGRAPHY N/A 09/08/2018   Procedure: RIGHT/LEFT HEART CATH AND CORONARY/GRAFT ANGIOGRAPHY;  Surgeon: Swaziland, Peter M, MD;  Location: [facility];  Service: Cardiovascular;  Laterality: N/A;  . TOTAL ABDOMINAL HYSTERECTOMY     ovaries took before hysterectomy  . TUBAL LIGATION    . URETERAL STENT PLACEMENT      FAMILY HISTORY: Family History  Problem Relation Age of Onset  . Cancer Father   . Diabetes Father   . Diabetes Brother   . Stroke Brother   . Diabetes Brother   . Heart disease Brother        CAD with CABG  . Heart disease Sister        CAD with MI  . Heart attack Sister     SOCIAL HISTORY: Social History   Socioeconomic History  . Marital status: Divorced    Spouse name: Not on file  . Number of children: 2  . Years of education: 65  . Highest education level: Not on file  Occupational History   . Occupation: retired  Tobacco Use  . Smoking status: Current Some Day Smoker    Packs/day: 0.33    Years: 35.00    Pack years: 11.55    Types: Cigarettes    Last attempt to quit: 06/11/2016    Years since quitting: 4.3  . Smokeless tobacco: Former Neurosurgeon    Types: Snuff, Dorna Bloom    Quit date: 11/20/2012  Vaping Use  . Vaping Use: Never used  Substance and Sexual Activity  . Alcohol use: Yes    Comment: 06/11/2016 "might have a few drinks/month; usually wine"  . Drug use: No  . Sexual activity: Not Currently  Other Topics Concern  . Not on file  Social History Narrative   Patient drinks about 1 cup of coffee daily.   Patient is right handed.    Social Determinants of Health   Financial Resource Strain: Not on file  Food Insecurity: Not on file  Transportation Needs: Not on file  Physical Activity: Not on file  Stress: Not on file  Social Connections: Not on file  Intimate Partner Violence: Not on file   PHYSICAL EXAM  Vitals:   10/06/20 0803  BP: (!) 190/87  Pulse: 81  Weight: 143 lb (64.9 kg)  Height: 5\' 1"  (1.549 m)   Body mass index is 27.02 kg/m.  Generalized: Well developed, in no acute distress  MMSE - Mini Mental State Exam 04/03/2020 04/03/2019  Orientation to time 5 5  Orientation to Place 5 5  Registration 3 3  Attention/ Calculation 5 5  Recall 3 3  Language- name 2 objects 2 2  Language- repeat 1 1  Language- follow 3 step command 3 3  Language- read & follow direction 1 1  Write a sentence 1 1  Copy design 1 1  Copy design-comments - named 5 animals  Total score 30 30    Neurological examination  Mentation: Alert oriented to time, place, history taking. Follows all commands speech and language fluent Cranial nerve II-XII: Pupils were equal round reactive to light. Extraocular movements were full, visual field were full on confrontational test. Facial  sensation and strength were normal.  Head turning and shoulder shrug  were normal and  symmetric. Motor: The motor testing reveals 5 over 5 strength of all 4 extremities. Good symmetric motor tone is noted throughout.  Sensory: Sensory testing is intact to soft touch on all 4 extremities. No evidence of extinction is noted.  Coordination: Cerebellar testing reveals good finger-nose-finger and heel-to-shin bilaterally.  Gait and station: Gait is normal. Tandem gait is normal. Romberg is negative. No drift is seen.  Reflexes: Deep tendon reflexes are symmetric and normal bilaterally.   DIAGNOSTIC DATA (LABS, IMAGING, TESTING) - I reviewed patient records, labs, notes, testing and imaging myself where available.  Lab Results  Component Value Date   WBC 5.3 01/09/2020   HGB 11.1 (L) 01/09/2020   HCT 34.4 (L) 01/09/2020   MCV 96.4 01/09/2020   PLT 166 01/09/2020      Component Value Date/Time   NA 142 08/18/2020 0955   NA 143 09/04/2018 1306   K 4.3 08/18/2020 0955   CL 109 08/18/2020 0955   CO2 26 08/18/2020 0955   GLUCOSE 122 (H) 08/18/2020 0955   BUN 13 08/18/2020 0955   BUN 26 09/04/2018 1306   CREATININE 0.87 08/18/2020 0955   CALCIUM 9.6 08/18/2020 0955   PROT 7.2 08/18/2020 0955   PROT 7.4 09/15/2015 1026   ALBUMIN 3.1 (L) 01/09/2020 0405   ALBUMIN 4.4 09/15/2015 1026   AST 15 08/18/2020 0955   ALT 13 08/18/2020 0955   ALKPHOS 65 01/09/2020 0405   BILITOT 0.4 08/18/2020 0955   BILITOT 0.3 09/15/2015 1026   GFRNONAA >60 01/09/2020 0405   GFRAA >60 01/09/2020 0405   Lab Results  Component Value Date   CHOL 117 08/18/2020   HDL 55 08/18/2020   LDLCALC 44 08/18/2020   LDLDIRECT 67 06/13/2018   TRIG 99 08/18/2020   CHOLHDL 2.1 08/18/2020   Lab Results  Component Value Date   HGBA1C 7.6 (H) 01/05/2020   Lab Results  Component Value Date   XQHSFJFJ09 400 03/22/2013   Lab Results  Component Value Date   TSH 0.959 08/14/2018   ASSESSMENT AND PLAN 65 y.o. year old female  has a past medical history of Carotid artery stenosis, Coronary artery  disease, Dyslipidemia, Family history of adverse reaction to anesthesia, Generalized convulsive epilepsy without mention of intractable epilepsy, GERD (gastroesophageal reflux disease), History of kidney stones, Hypertension, Memory deficit, Migraine, Nephrolithiasis (07/30/2015), OSA (obstructive sleep apnea), PONV (postoperative nausea and vomiting), Seizures ([place]), Type II diabetes mellitus ([place]), and Vasovagal syncope (07/30/2015). here with:  1.  Seizures, well controlled 2.  Migraine headaches 3.  History of kidney stones -No reported seizures, or migraine headaches -Continue Topamax 75 mg twice daily -Continue Keppra 500 mg twice a day -Urology reports no indication to change medication therapy at this point -BP elevated, she needs to take her home medications 4.  Reported mild memory disturbance -Has been stable, MMSE 30/30 in Aug 2021 -Follow-up in 1 year or sooner if needed  I spent 20 minutes of face-to-face and non-face-to-face time with patient.  This included previsit chart review, lab review, study review, order entry, electronic health record documentation, patient education.  Butler Denmark, AGNP-C, DNP 10/06/2020, 8:10 AM [facility] [address] [place], [place] [address] [phone]

## 2020-10-06 NOTE — Progress Notes (Signed)
I have read the note, and I agree with the clinical assessment and plan.  Charles K Willis   

## 2020-10-06 NOTE — Patient Instructions (Signed)
Continue current medications Call for seizures See you back in 1 year

## 2021-01-20 ENCOUNTER — Inpatient Hospital Stay (HOSPITAL_COMMUNITY)
Admission: EM | Admit: 2021-01-20 | Discharge: 2021-01-30 | DRG: 065 | Disposition: A | Discharge status: Skilled Nursing Facility | Payer: Medicare HMO | Attending: Internal Medicine | Admitting: Internal Medicine

## 2021-01-20 ENCOUNTER — Emergency Department (HOSPITAL_COMMUNITY): Payer: Medicare HMO

## 2021-01-20 ENCOUNTER — Encounter (HOSPITAL_COMMUNITY): Payer: Self-pay

## 2021-01-20 DIAGNOSIS — Z833 Family history of diabetes mellitus: Secondary | ICD-10-CM | POA: Diagnosis not present

## 2021-01-20 DIAGNOSIS — E785 Hyperlipidemia, unspecified: Secondary | ICD-10-CM | POA: Diagnosis present

## 2021-01-20 DIAGNOSIS — I10 Essential (primary) hypertension: Secondary | ICD-10-CM | POA: Diagnosis present

## 2021-01-20 DIAGNOSIS — F411 Generalized anxiety disorder: Secondary | ICD-10-CM | POA: Diagnosis not present

## 2021-01-20 DIAGNOSIS — Z7984 Long term (current) use of oral hypoglycemic drugs: Secondary | ICD-10-CM | POA: Diagnosis not present

## 2021-01-20 DIAGNOSIS — Z9851 Tubal ligation status: Secondary | ICD-10-CM | POA: Diagnosis not present

## 2021-01-20 DIAGNOSIS — G40409 Other generalized epilepsy and epileptic syndromes, not intractable, without status epilepticus: Secondary | ICD-10-CM | POA: Diagnosis present

## 2021-01-20 DIAGNOSIS — I639 Cerebral infarction, unspecified: Secondary | ICD-10-CM | POA: Diagnosis not present

## 2021-01-20 DIAGNOSIS — Z888 Allergy status to other drugs, medicaments and biological substances status: Secondary | ICD-10-CM

## 2021-01-20 DIAGNOSIS — Z87442 Personal history of urinary calculi: Secondary | ICD-10-CM | POA: Diagnosis not present

## 2021-01-20 DIAGNOSIS — I2510 Atherosclerotic heart disease of native coronary artery without angina pectoris: Secondary | ICD-10-CM | POA: Diagnosis present

## 2021-01-20 DIAGNOSIS — Z880 Allergy status to penicillin: Secondary | ICD-10-CM

## 2021-01-20 DIAGNOSIS — Z951 Presence of aortocoronary bypass graft: Secondary | ICD-10-CM

## 2021-01-20 DIAGNOSIS — G4733 Obstructive sleep apnea (adult) (pediatric): Secondary | ICD-10-CM | POA: Diagnosis present

## 2021-01-20 DIAGNOSIS — Z79899 Other long term (current) drug therapy: Secondary | ICD-10-CM | POA: Diagnosis not present

## 2021-01-20 DIAGNOSIS — Z9114 Patient's other noncompliance with medication regimen: Secondary | ICD-10-CM

## 2021-01-20 DIAGNOSIS — R29702 NIHSS score 2: Secondary | ICD-10-CM | POA: Diagnosis present

## 2021-01-20 DIAGNOSIS — R569 Unspecified convulsions: Secondary | ICD-10-CM | POA: Diagnosis not present

## 2021-01-20 DIAGNOSIS — K219 Gastro-esophageal reflux disease without esophagitis: Secondary | ICD-10-CM | POA: Insufficient documentation

## 2021-01-20 DIAGNOSIS — F419 Anxiety disorder, unspecified: Secondary | ICD-10-CM | POA: Diagnosis present

## 2021-01-20 DIAGNOSIS — G40909 Epilepsy, unspecified, not intractable, without status epilepticus: Secondary | ICD-10-CM | POA: Diagnosis not present

## 2021-01-20 DIAGNOSIS — Z720 Tobacco use: Secondary | ICD-10-CM | POA: Diagnosis not present

## 2021-01-20 DIAGNOSIS — Z20822 Contact with and (suspected) exposure to covid-19: Secondary | ICD-10-CM | POA: Diagnosis present

## 2021-01-20 DIAGNOSIS — I69393 Ataxia following cerebral infarction: Secondary | ICD-10-CM | POA: Diagnosis not present

## 2021-01-20 DIAGNOSIS — Z90710 Acquired absence of both cervix and uterus: Secondary | ICD-10-CM | POA: Diagnosis not present

## 2021-01-20 DIAGNOSIS — Z8249 Family history of ischemic heart disease and other diseases of the circulatory system: Secondary | ICD-10-CM | POA: Diagnosis not present

## 2021-01-20 DIAGNOSIS — I69354 Hemiplegia and hemiparesis following cerebral infarction affecting left non-dominant side: Secondary | ICD-10-CM | POA: Diagnosis not present

## 2021-01-20 DIAGNOSIS — I6389 Other cerebral infarction: Secondary | ICD-10-CM | POA: Diagnosis not present

## 2021-01-20 DIAGNOSIS — I63541 Cerebral infarction due to unspecified occlusion or stenosis of right cerebellar artery: Principal | ICD-10-CM | POA: Diagnosis present

## 2021-01-20 DIAGNOSIS — E1169 Type 2 diabetes mellitus with other specified complication: Secondary | ICD-10-CM | POA: Diagnosis not present

## 2021-01-20 DIAGNOSIS — E119 Type 2 diabetes mellitus without complications: Secondary | ICD-10-CM | POA: Diagnosis present

## 2021-01-20 DIAGNOSIS — Z7982 Long term (current) use of aspirin: Secondary | ICD-10-CM | POA: Diagnosis not present

## 2021-01-20 DIAGNOSIS — I6529 Occlusion and stenosis of unspecified carotid artery: Secondary | ICD-10-CM | POA: Diagnosis present

## 2021-01-20 DIAGNOSIS — F17210 Nicotine dependence, cigarettes, uncomplicated: Secondary | ICD-10-CM | POA: Diagnosis present

## 2021-01-20 DIAGNOSIS — E669 Obesity, unspecified: Secondary | ICD-10-CM | POA: Diagnosis not present

## 2021-01-20 LAB — COMPREHENSIVE METABOLIC PANEL
ALT: 17 U/L (ref 0–44)
AST: 21 U/L (ref 15–41)
Albumin: 4.6 g/dL (ref 3.5–5.0)
Alkaline Phosphatase: 127 U/L — ABNORMAL HIGH (ref 38–126)
Anion gap: 9 (ref 5–15)
BUN: 14 mg/dL (ref 8–23)
CO2: 23 mmol/L (ref 22–32)
Calcium: 10.1 mg/dL (ref 8.9–10.3)
Chloride: 105 mmol/L (ref 98–111)
Creatinine, Ser: 0.98 mg/dL (ref 0.44–1.00)
GFR, Estimated: >60 mL/min (ref >60)
Glucose, Bld: 164 mg/dL — ABNORMAL HIGH (ref 70–99)
Potassium: 4.2 mmol/L (ref 3.5–5.1)
Sodium: 137 mmol/L (ref 135–145)
Total Bilirubin: 0.8 mg/dL (ref 0.3–1.2)
Total Protein: 8.1 g/dL (ref 6.5–8.1)

## 2021-01-20 LAB — CBC
HCT: 49.3 % — ABNORMAL HIGH (ref 36.0–46.0)
Hemoglobin: 16.5 g/dL — ABNORMAL HIGH (ref 12.0–15.0)
MCH: 31.9 pg (ref 26.0–34.0)
MCHC: 33.5 g/dL (ref 30.0–36.0)
MCV: 95.4 fL (ref 80.0–100.0)
Platelets: 220 10*3/uL (ref 150–400)
RBC: 5.17 MIL/uL — ABNORMAL HIGH (ref 3.87–5.11)
RDW: 12.7 % (ref 11.5–15.5)
WBC: 8.1 10*3/uL (ref 4.0–10.5)
nRBC: 0 % (ref 0.0–0.2)

## 2021-01-20 LAB — PROTIME-INR
INR: 1 (ref 0.8–1.2)
Prothrombin Time: 13.1 seconds (ref 11.4–15.2)

## 2021-01-20 LAB — I-STAT CHEM 8, ED
BUN: 16 mg/dL (ref 8–23)
Calcium, Ion: 1.21 mmol/L (ref 1.15–1.40)
Chloride: 106 mmol/L (ref 98–111)
Creatinine, Ser: 0.9 mg/dL (ref 0.44–1.00)
Glucose, Bld: 164 mg/dL — ABNORMAL HIGH (ref 70–99)
HCT: 49 % — ABNORMAL HIGH (ref 36.0–46.0)
Hemoglobin: 16.7 g/dL — ABNORMAL HIGH (ref 12.0–15.0)
Potassium: 4.3 mmol/L (ref 3.5–5.1)
Sodium: 140 mmol/L (ref 135–145)
TCO2: 22 mmol/L (ref 22–32)

## 2021-01-20 LAB — DIFFERENTIAL
Abs Immature Granulocytes: 0.02 10*3/uL (ref 0.00–0.07)
Basophils Absolute: 0 10*3/uL (ref 0.0–0.1)
Basophils Relative: 0 %
Eosinophils Absolute: 0 10*3/uL (ref 0.0–0.5)
Eosinophils Relative: 0 %
Immature Granulocytes: 0 %
Lymphocytes Relative: 34 %
Lymphs Abs: 2.7 10*3/uL (ref 0.7–4.0)
Monocytes Absolute: 0.5 10*3/uL (ref 0.1–1.0)
Monocytes Relative: 6 %
Neutro Abs: 4.7 10*3/uL (ref 1.7–7.7)
Neutrophils Relative %: 60 %

## 2021-01-20 LAB — CBG MONITORING, ED
Glucose-Capillary: 129 mg/dL — ABNORMAL HIGH (ref 70–99)
Glucose-Capillary: 140 mg/dL — ABNORMAL HIGH (ref 70–99)

## 2021-01-20 LAB — RESP PANEL BY RT-PCR (FLU A&B, COVID) ARPGX2
Influenza A by PCR: NEGATIVE
Influenza B by PCR: NEGATIVE
SARS Coronavirus 2 by RT PCR: NEGATIVE

## 2021-01-20 LAB — APTT: aPTT: 24 seconds (ref 24–36)

## 2021-01-20 IMAGING — MR MR HEAD W/O CM
10 of 11 series · 43 of 48 positions shown · non-contrast
Comparison: CT head [DATE]

CLINICAL DATA: Acute neuro deficit.  Stroke.

EXAM:
MRI HEAD WITHOUT CONTRAST
TECHNIQUE: Multiplanar, multiecho pulse sequences of the brain and surrounding
structures were obtained without intravenous contrast.

[Series 5: DWI · axial · 3.0mm · 0.88mm/px · z∈[-85,+62]mm · 10 of 100 slices shown (1 of 4)]
[im 1/100]
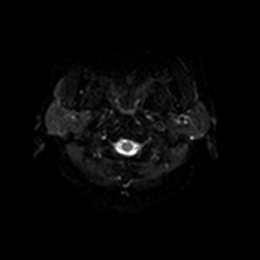
[im 12/100]
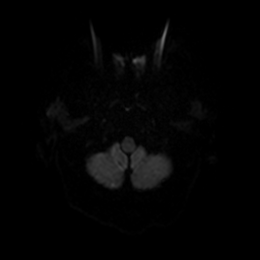
[im 23/100]
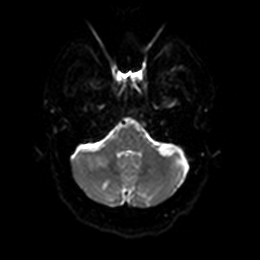
[im 34/100]
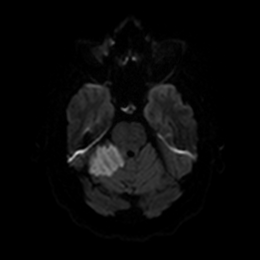
[im 45/100]
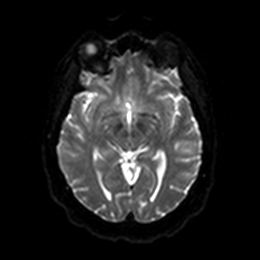
[im 56/100]
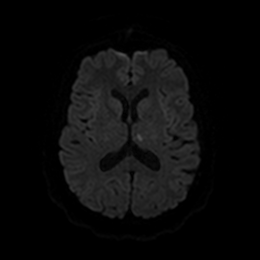
[im 67/100]
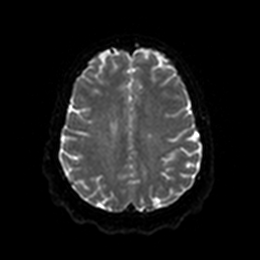
[im 78/100]
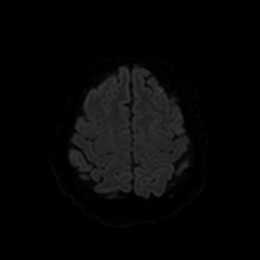
[im 89/100]
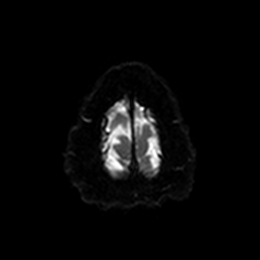
[im 100/100]
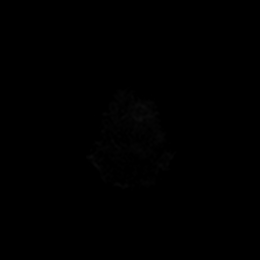

[Series 6: DWI · axial · 3.0mm · 0.88mm/px · z∈[-85,+62]mm · 5 of 50 slices shown (2 of 4)]
[im 1/50]
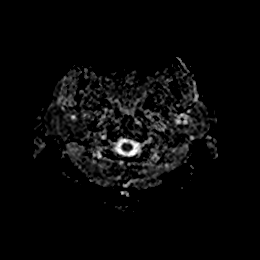
[im 13/50]
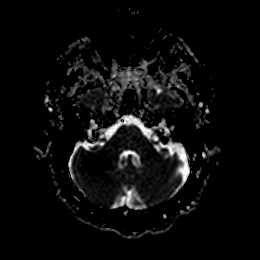
[im 25/50]
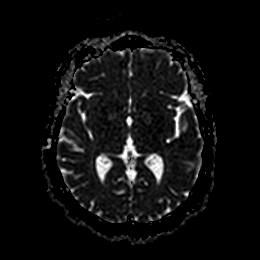
[im 37/50]
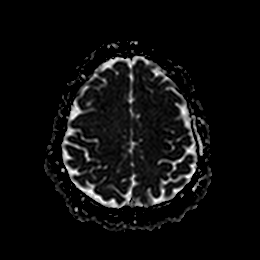
[im 50/50]
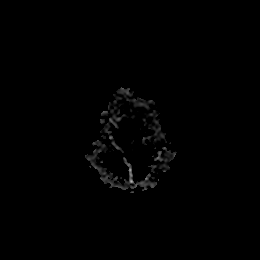

[Series 7: DWI · coronal · 4.0mm · 0.88mm/px · 6 of 65 slices shown (3 of 4)]
[im 1/65]
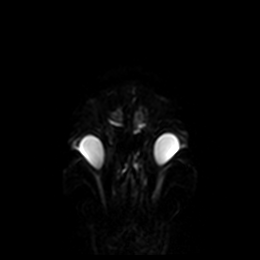
[im 13/65]
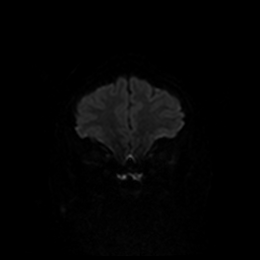
[im 26/65]
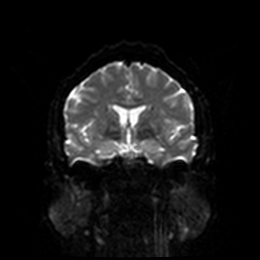
[im 39/65]
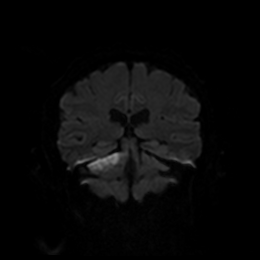
[im 52/65]
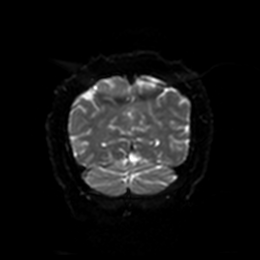
[im 65/65]
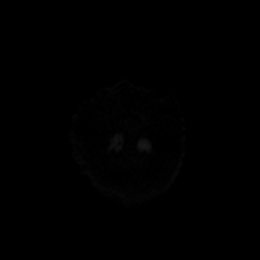

[Series 8: DWI · coronal · 4.0mm · 0.88mm/px · 3 of 33 slices shown (4 of 4)]
[im 1/33]
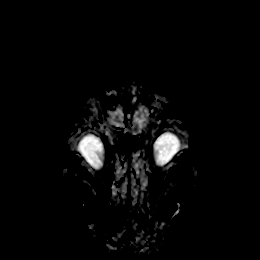
[im 17/33]
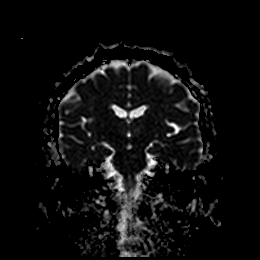
[im 33/33]
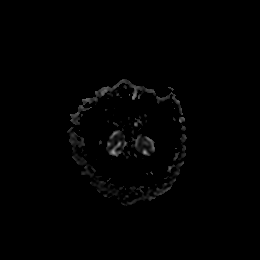

[Series 17: T1 · sagittal · 5.0mm · 0.72mm/px · 2 of 25 slices shown]
[im 1/25]
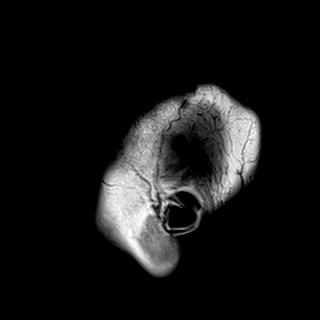
[im 25/25]
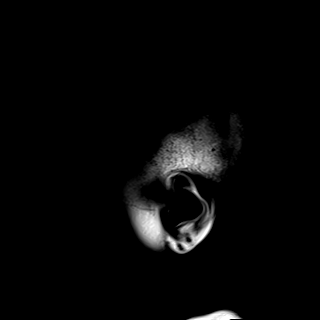

[Series 18: T2 · axial · 5.0mm · 0.72mm/px · z∈[-84,+60]mm · 2 of 25 slices shown (1 of 2)]
[im 1/25]
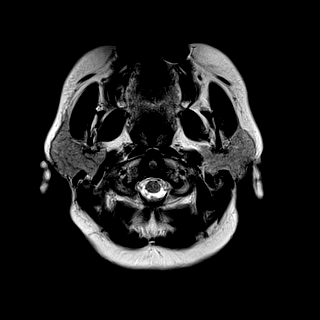
[im 25/25]
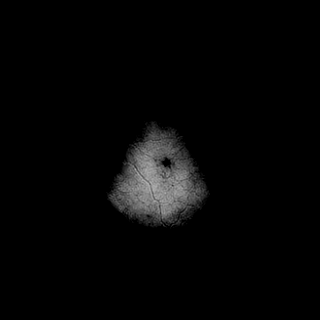

[Series 19: FLAIR · axial · 5.0mm · 0.45mm/px · z∈[-83,+61]mm · 2 of 25 slices shown]
[im 1/25]
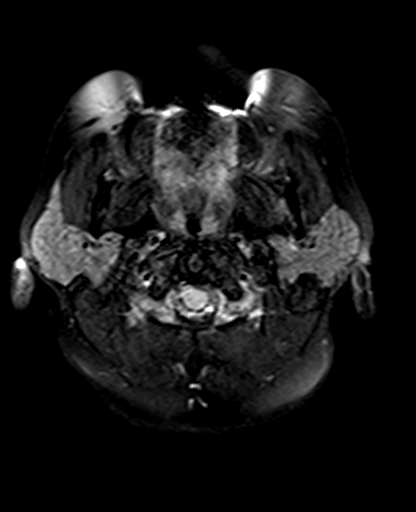
[im 25/25]
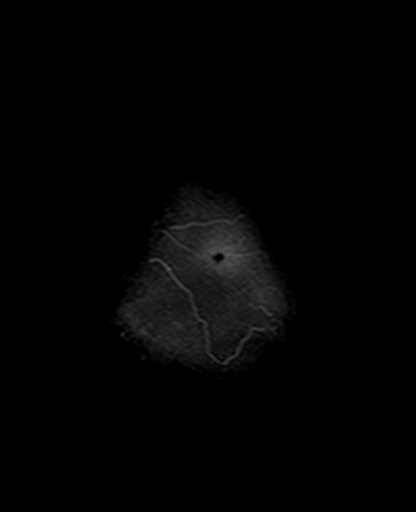

[Series 21: pha_images · axial · 3.0mm · 0.90mm/px · z∈[-90,+72]mm · 5 of 55 slices shown]
[im 1/55]
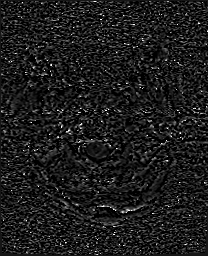
[im 14/55]
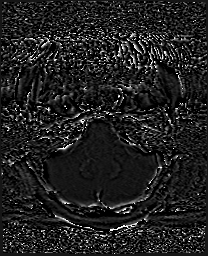
[im 28/55]
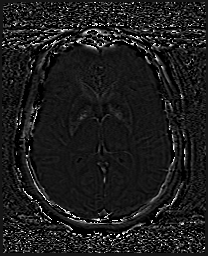
[im 41/55]
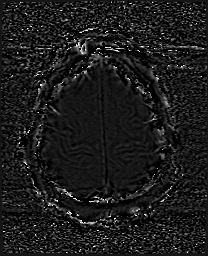
[im 55/55]
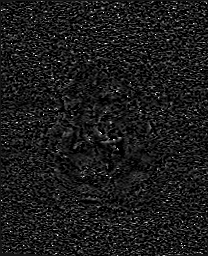

[Series 22: swi_images · axial · 3.0mm · 0.90mm/px · z∈[-93,+72]mm · 5 of 56 slices shown]
[im 1/56]
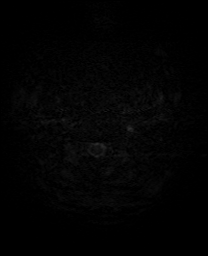
[im 14/56]
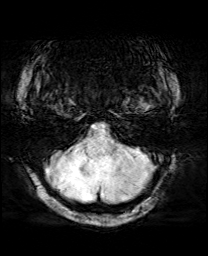
[im 28/56]
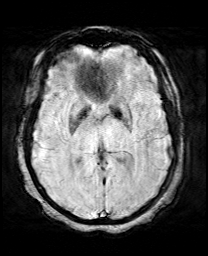
[im 42/56]
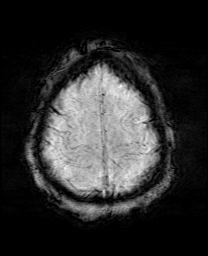
[im 56/56]
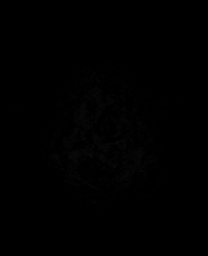

[Series 25: T2 · coronal · 5.0mm · 0.34mm/px · 3 of 29 slices shown (2 of 2)]
[im 1/29]
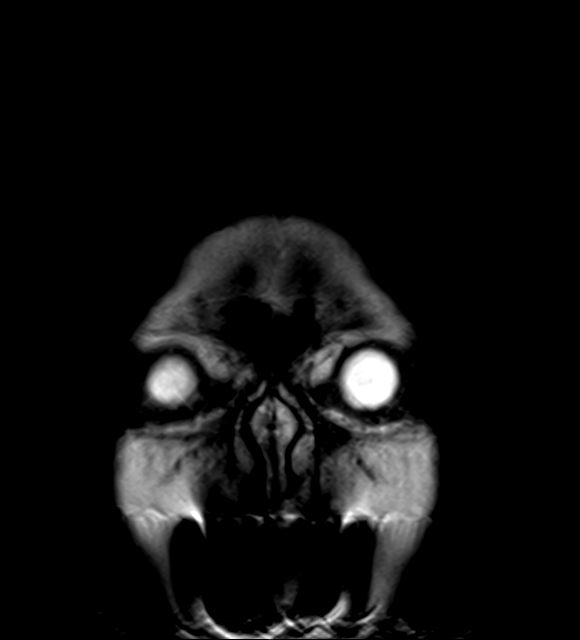
[im 15/29]
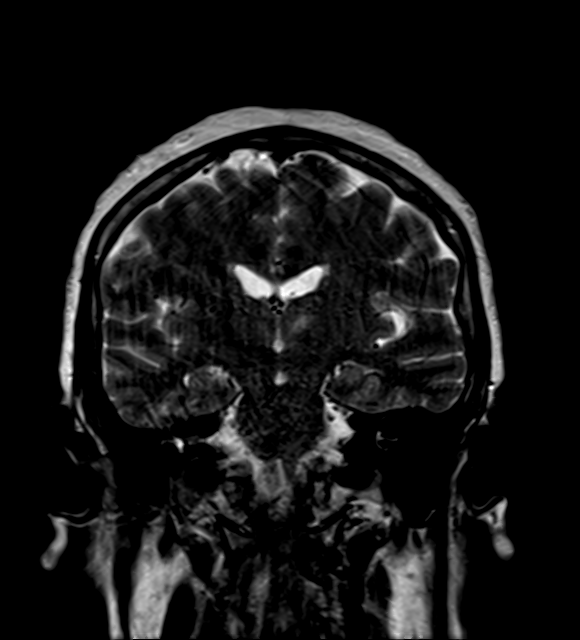
[im 29/29]
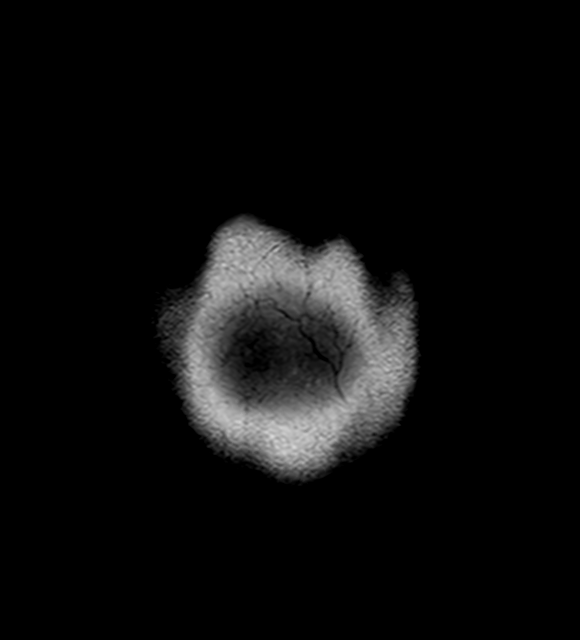

[43 of 48 positions shown; findings below may reference images not displayed]

FINDINGS: Brain: 3 cm acute infarct in the right superior cerebellar artery
territory. Additional small area of acute infarct in the right PICA
territory. Small acute infarct in the left thalamus.

Ventricle size is normal. Negative for hemorrhage or mass. Few small
white matter hyperintensities bilaterally likely due to chronic
microvascular ischemia.

Vascular: Normal arterial flow voids.

Skull and upper cervical spine: Negative

Sinuses/Orbits: Paranasal sinuses clear. Bilateral cataract
extraction

Other: None
IMPRESSION: 3 cm acute infarct right superior cerebellum. Smaller acute infarct
right PICA territory. Small acute infarct left thalamus.

## 2021-01-20 IMAGING — MR MR MRA HEAD W/O CM
1 series · 25 of 48 positions shown · non-contrast
Comparison: MRI head [DATE]

CLINICAL DATA: Stroke

EXAM:
MRA HEAD WITHOUT CONTRAST
TECHNIQUE: Angiographic images of the Circle of Willis were acquired using MRA
technique without intravenous contrast.

[Series 100: 3d cow · axial · 0.5mm · 0.41mm/px · z∈[-82,+8]mm · 25 of 188 slices shown]
[im 1/188]
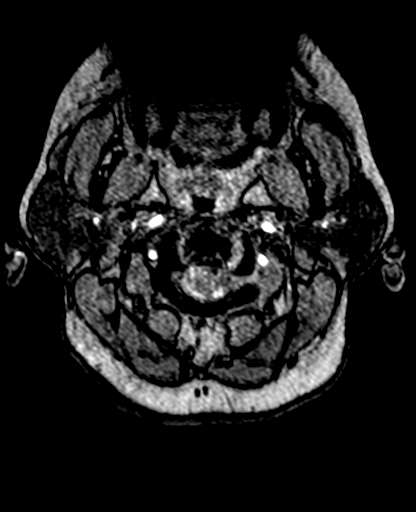
[im 4/188]
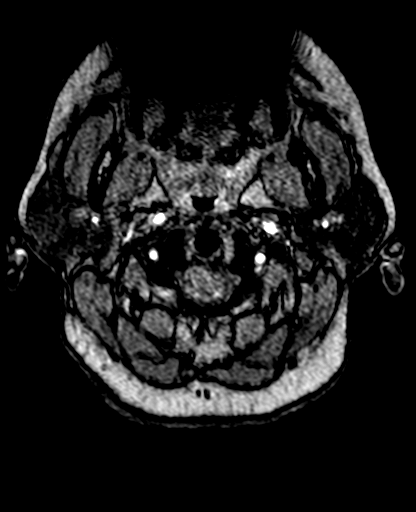
[im 8/188]
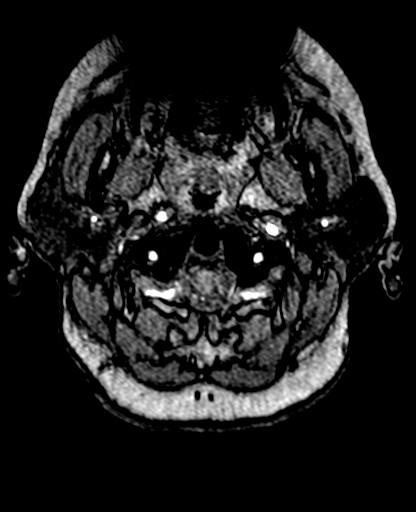
[im 12/188]
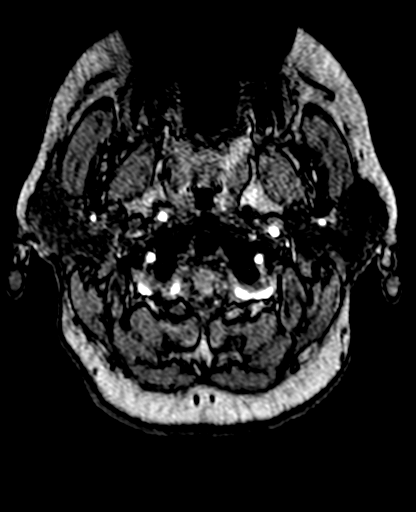
[im 16/188]
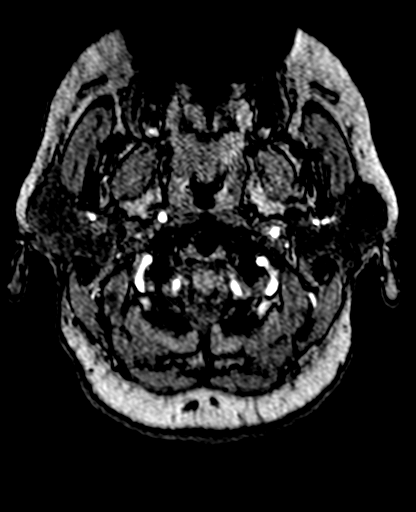
[im 20/188]
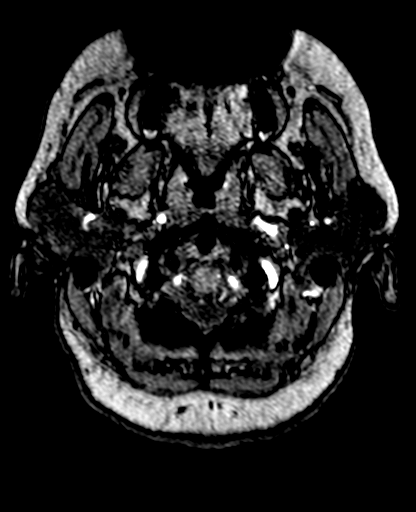
[im 24/188]
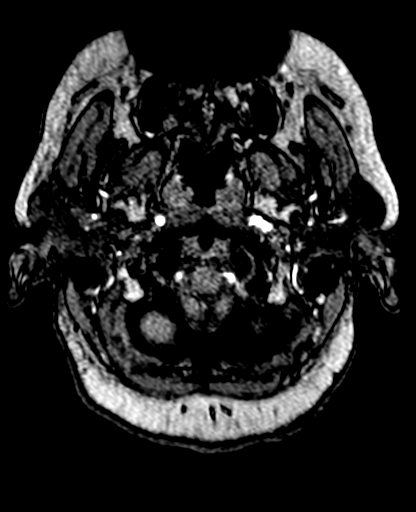
[im 28/188]
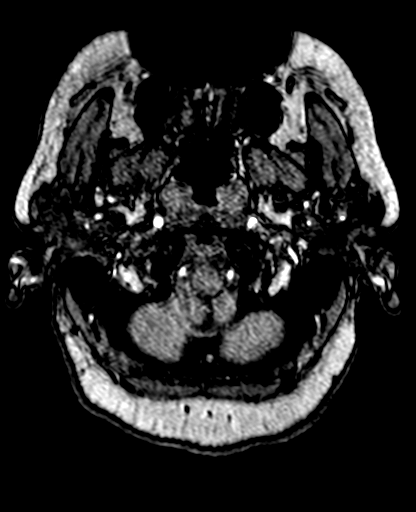
[im 32/188]
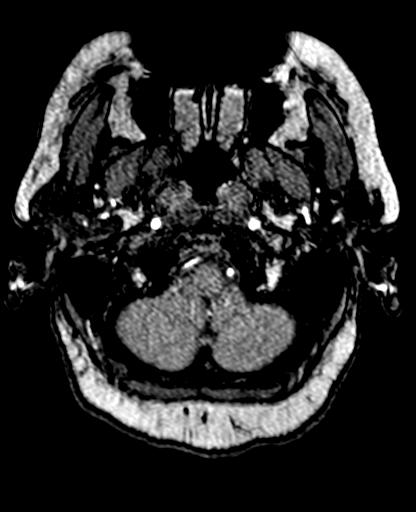
[im 36/188]
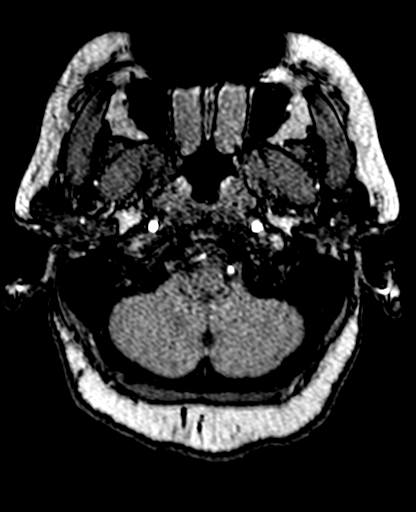
[im 40/188]
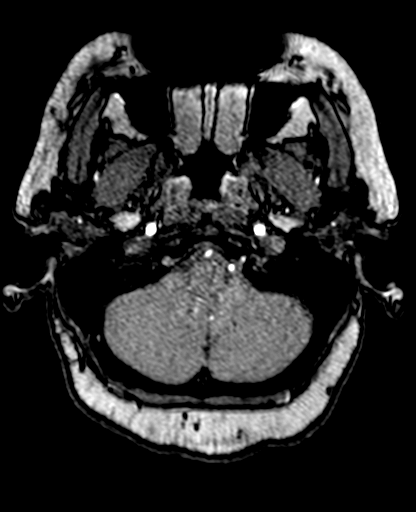
[im 44/188]
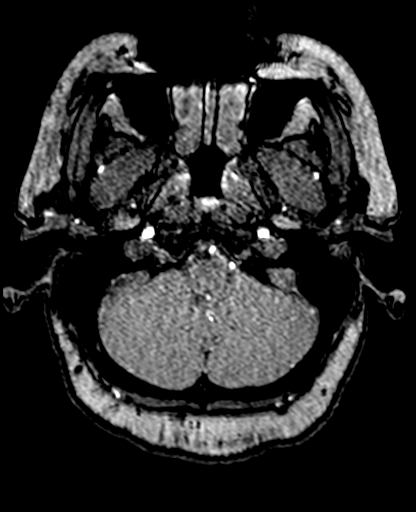
[im 48/188]
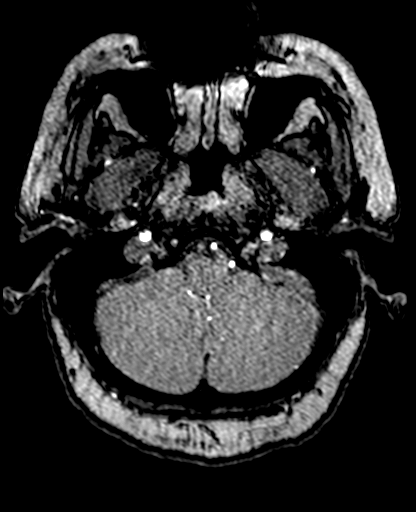
[im 52/188]
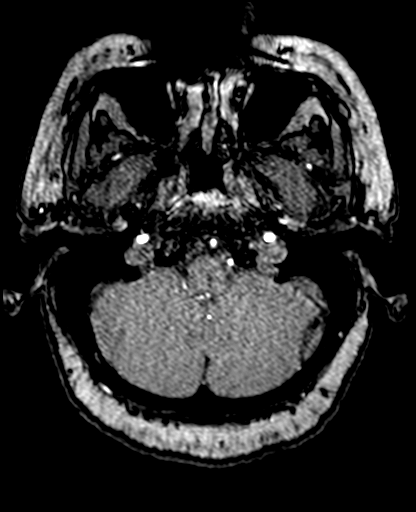
[im 56/188]
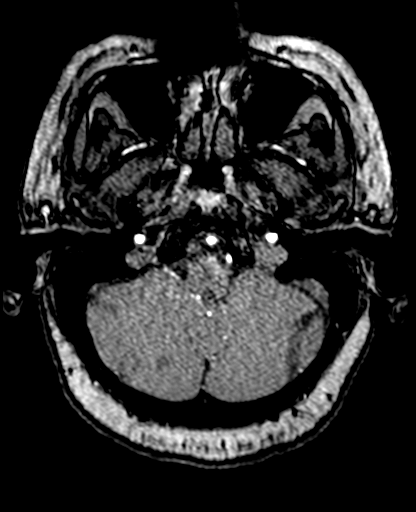
[im 60/188]
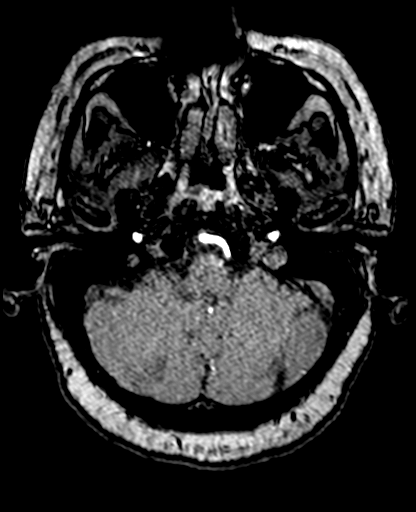
[im 64/188]
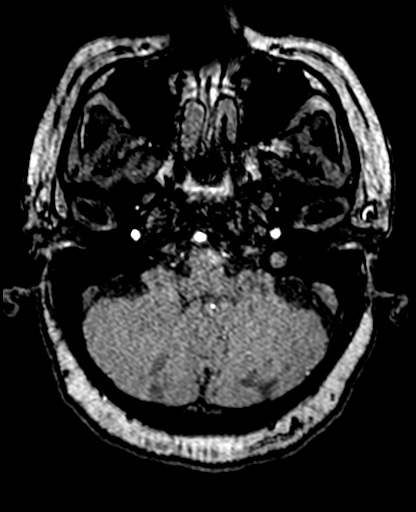
[im 68/188]
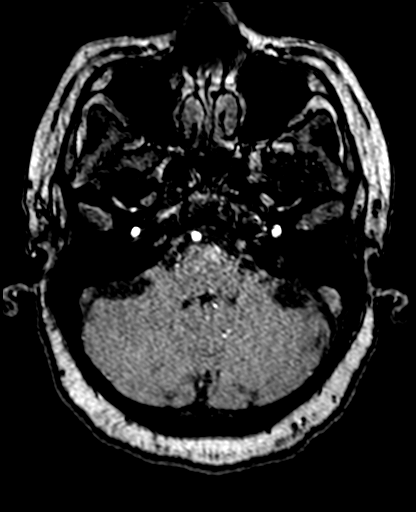
[im 84/188]
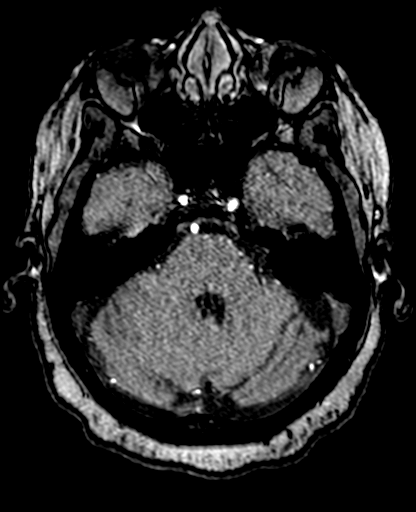
[im 96/188]
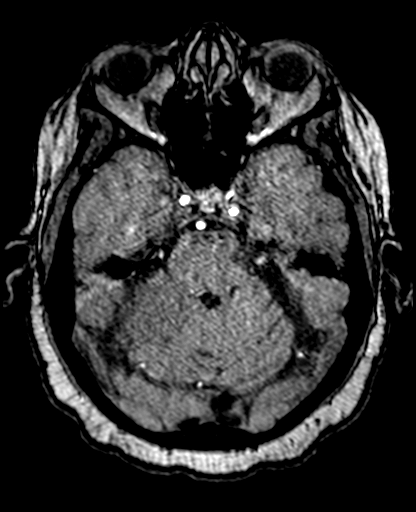
[im 108/188]
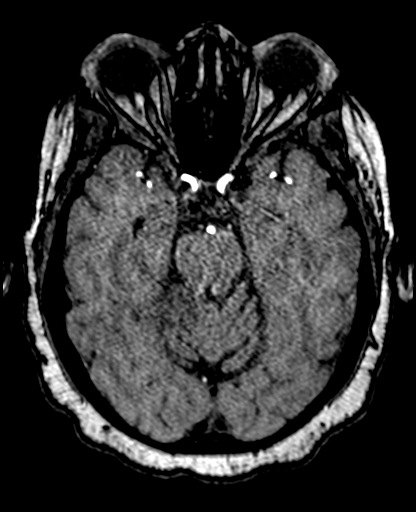
[im 132/188]
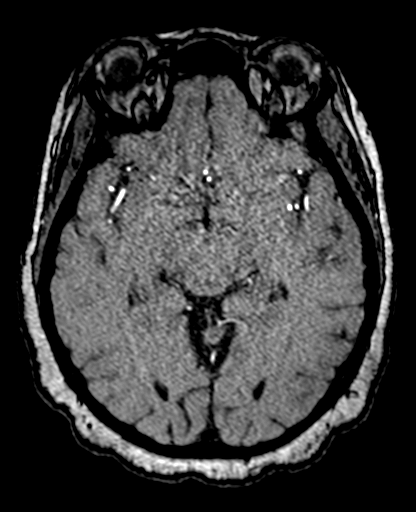
[im 156/188]
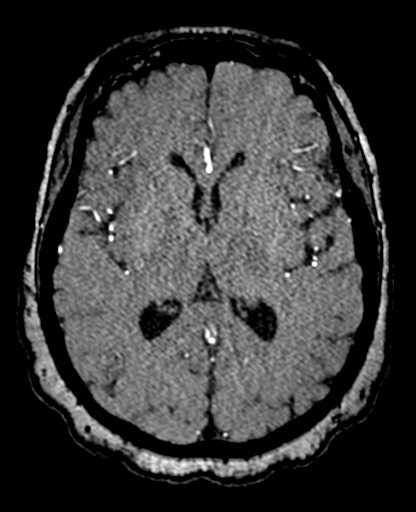
[im 160/188]
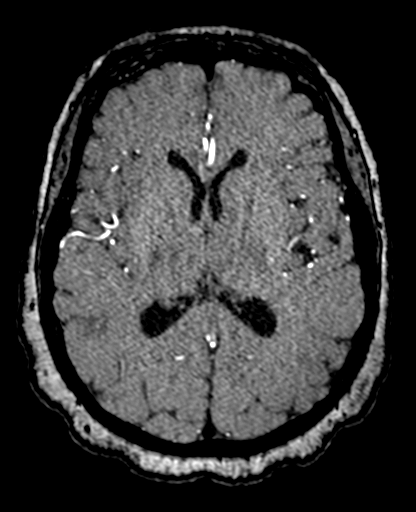
[im 180/188]
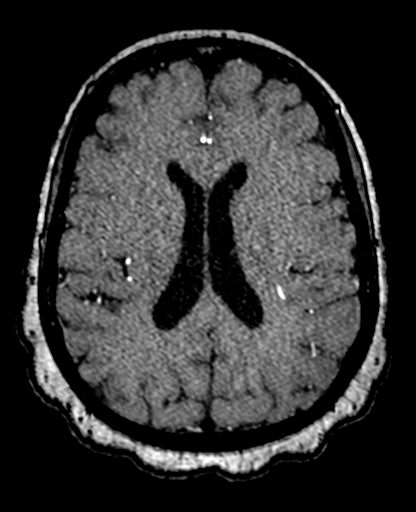

[25 of 48 positions shown; findings below may reference images not displayed]

FINDINGS: Anterior circulation: Internal carotid artery widely patent
bilaterally. Anterior and middle cerebral arteries patent
bilaterally without stenosis or large vessel occlusion.

Posterior circulation: Both vertebral arteries patent to the basilar
without stenosis. Right PICA not visualized. Very small left PICA
Ca. Left AICA patent. Right AICA not visualized.

Right superior cerebral artery occluded proximally. Left superior
cerebellar artery patent. Posterior cerebral arteries patent
bilaterally.

Anatomic variants: None

Other: Negative for cerebral aneurysm.
IMPRESSION: Both vertebral arteries patent to the basilar. Right PICA not
visualized. Small left PICA. Right superior cerebral artery occluded
proximally compatible with acute infarct.

## 2021-01-20 IMAGING — CT CT HEAD W/O CM
3 of 4 series · 15 of 47 positions shown, 18 images · non-contrast
Comparison: None.

CLINICAL DATA: Unsteady gait and leg weakness.

EXAM:
CT HEAD WITHOUT CONTRAST
TECHNIQUE: Contiguous axial images were obtained from the base of the skull
through the vertex without intravenous contrast.

[Series 4: head 2.0 h70h · axial · 0.41mm/px · z∈[-99,+13]mm · 9 of 70 slices shown, 12 images]
[im 7/70  brain]
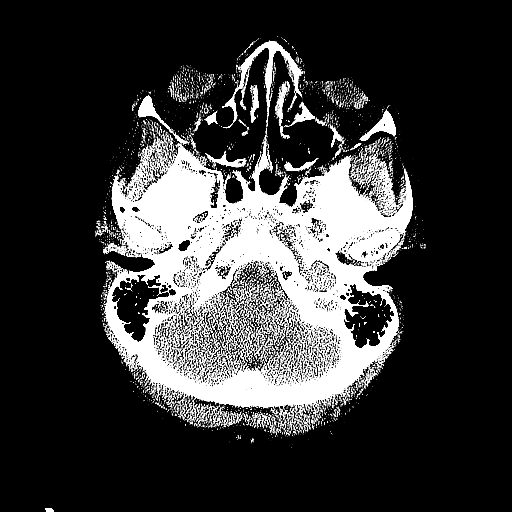
[im 7/70  bone]
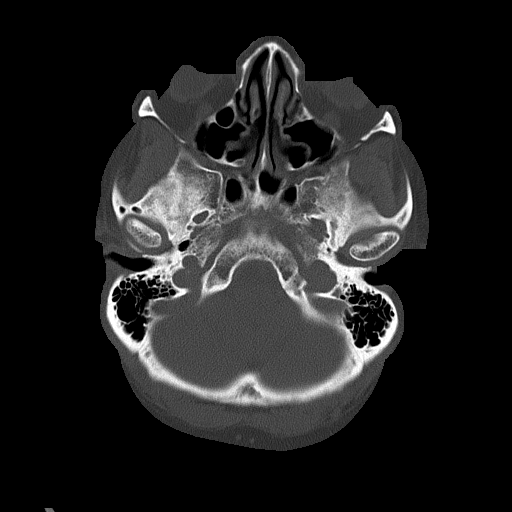
[im 14/70  brain]
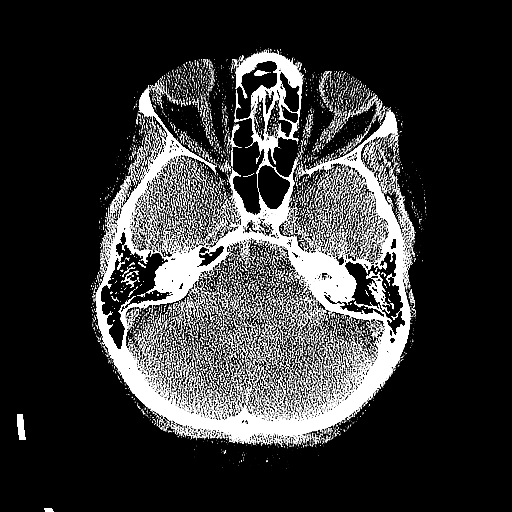
[im 21/70  brain]
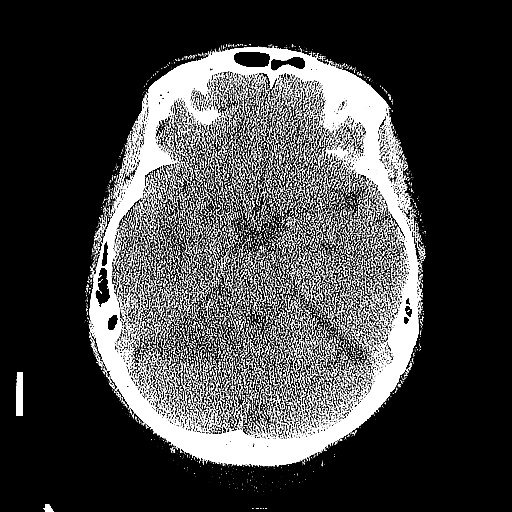
[im 28/70  brain]
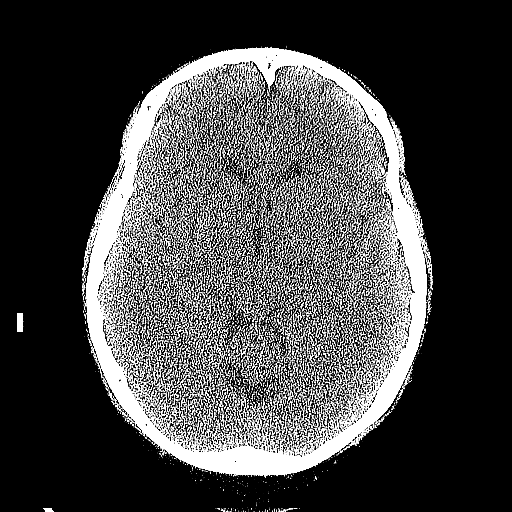
[im 35/70  brain]
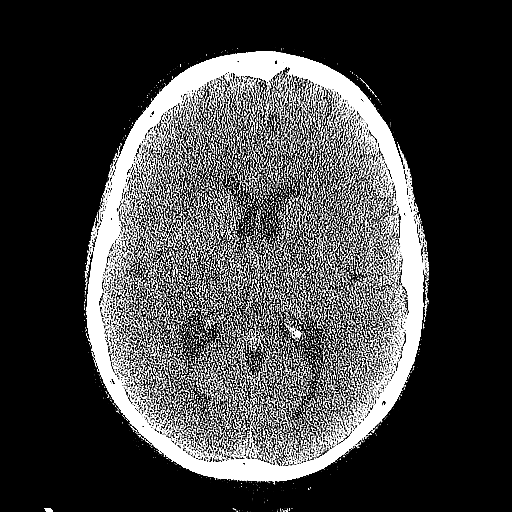
[im 35/70  bone]
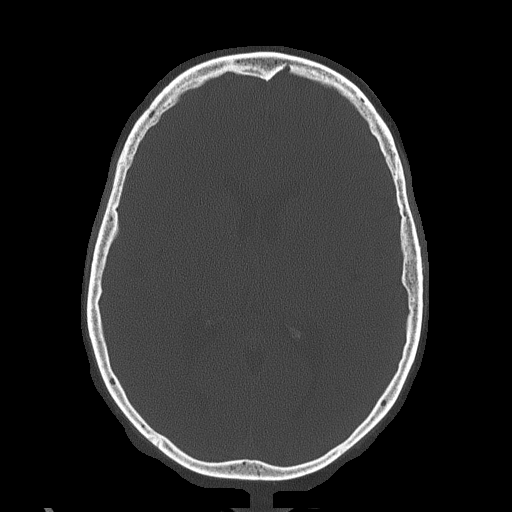
[im 42/70  brain]
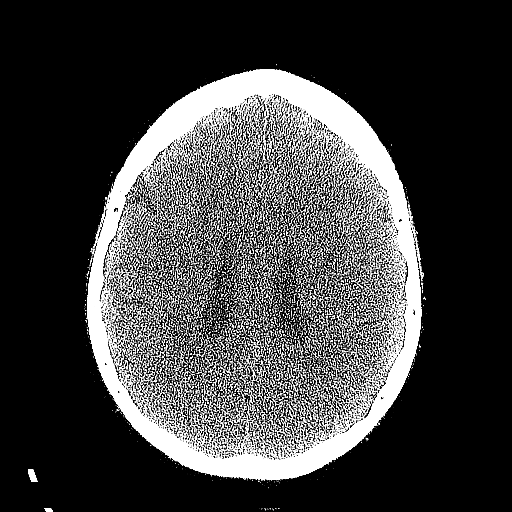
[im 49/70  brain]
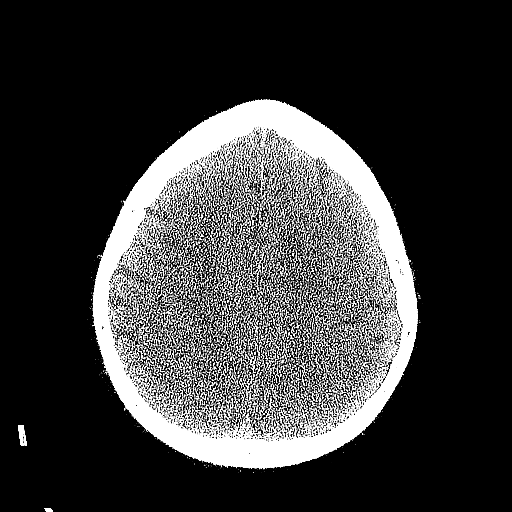
[im 56/70  brain]
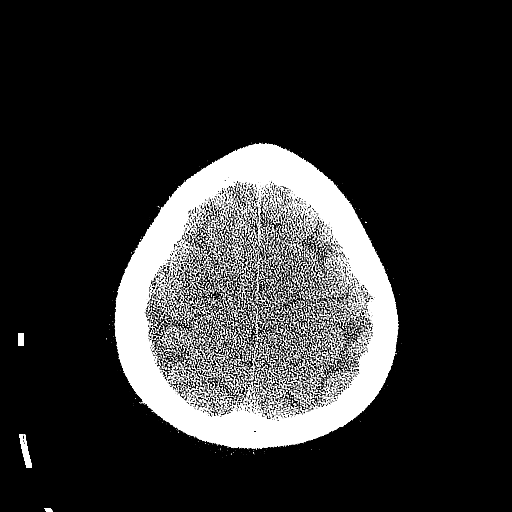
[im 63/70  brain]
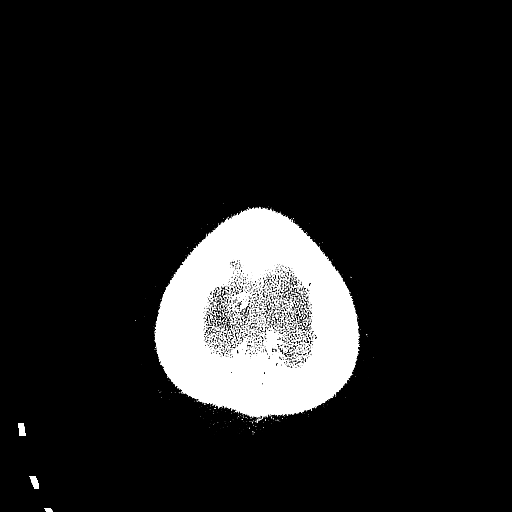
[im 63/70  bone]
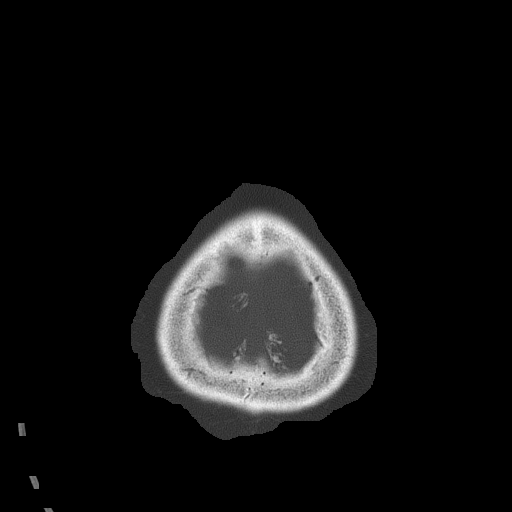

[Series 5: head 3.0 mpr cor · coronal · 0.28mm/px · 3 of 66 slices shown]
[im 22/66  brain]
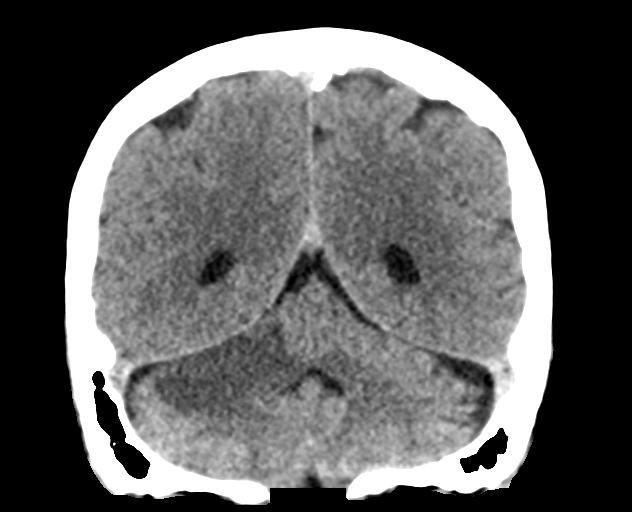
[im 29/66  brain]
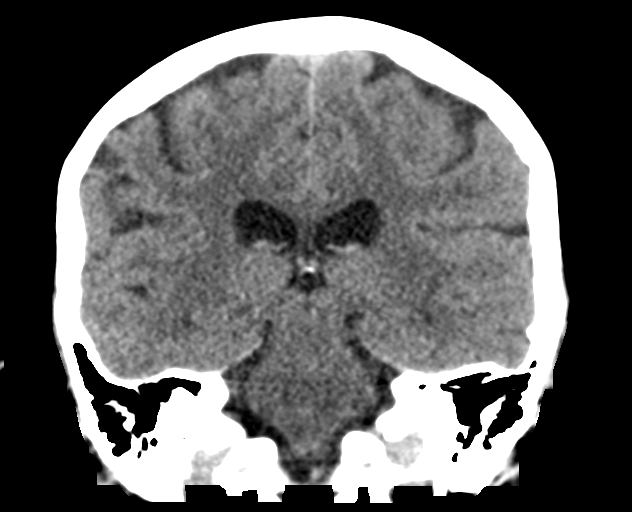
[im 37/66  brain]
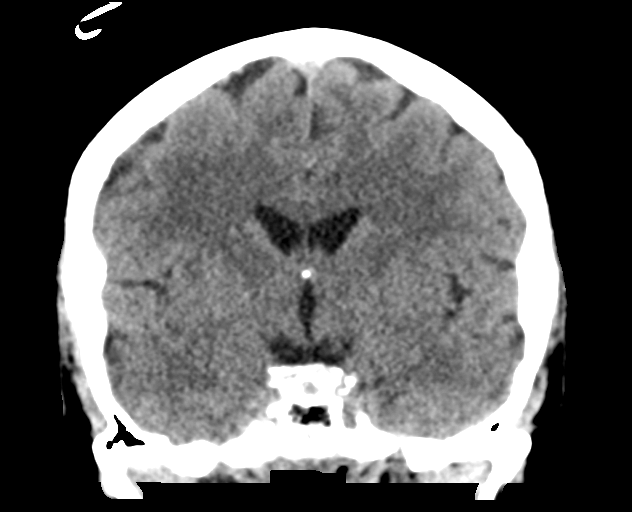

[Series 6: head 3.0 mpr sag · sagittal · 0.28mm/px · 3 of 59 slices shown]
[im 20/59  brain]
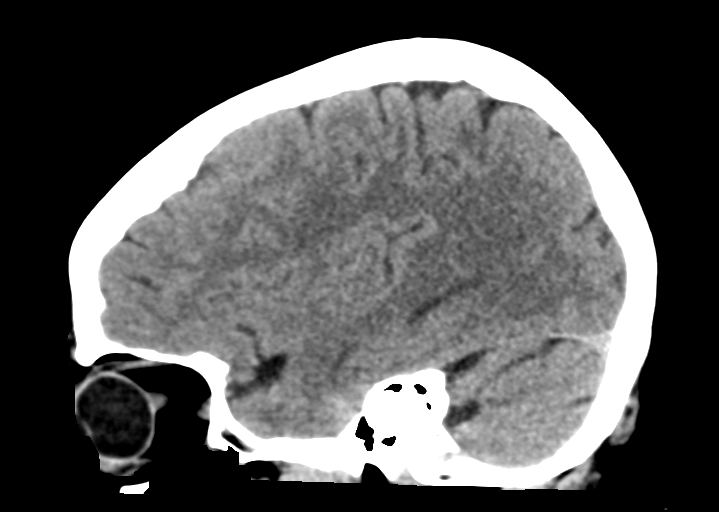
[im 30/59  brain]
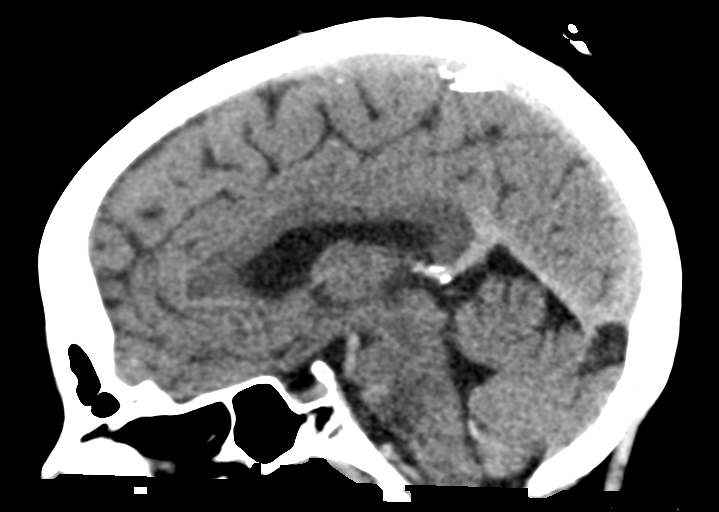
[im 39/59  brain]
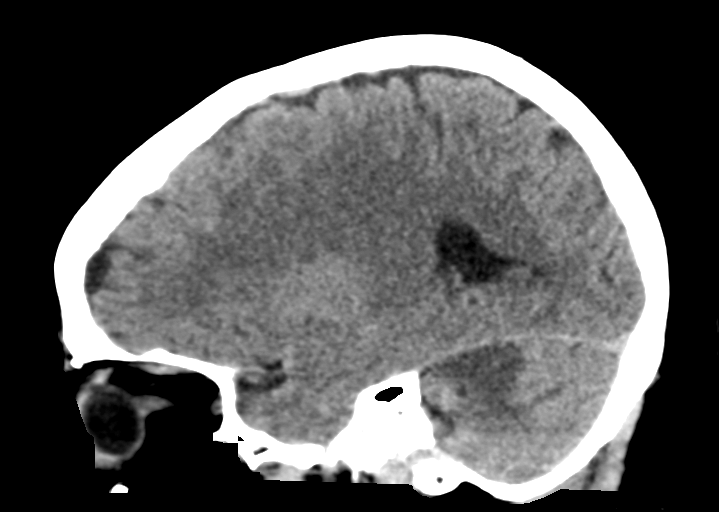

[15 of 47 positions shown; findings below may reference images not displayed]

FINDINGS: Brain: There is a 4 cm infarct in the superior aspect of the right
cerebellar hemisphere consistent with an acute to subacute
appearance. Cytotoxic edema results in mild local mass effect. There
is also likely a separate subcentimeter subacute infarct more
posteriorly in the right cerebellar hemisphere. No intracranial
hemorrhage, midline shift, or extra-axial fluid collection is
identified. The ventricles are normal in size.

Vascular: Calcified atherosclerosis at the skull base.

Skull: No fracture or suspicious osseous lesion.

Sinuses/Orbits: Minimal bilateral ethmoid air cell mucosal
thickening. Clear mastoid air cells. Bilateral cataract extraction.

Other: None.
IMPRESSION: Acute to subacute right cerebellar infarct.

## 2021-01-20 IMAGING — MR MR MRA NECK WO/W CM
7 series · 44 of 48 positions shown · IV contrast (Gadavist)
Comparison: MRI head [DATE]

CLINICAL DATA: Stroke

EXAM:
MRA NECK WITHOUT AND WITH CONTRAST
TECHNIQUE: Multiplanar and multiecho pulse sequences of the neck were obtained
without and with intravenous contrast. Angiographic images of the
neck were obtained using MRA technique without and with intravenous
contrast.
CONTRAST:  5mL GADAVIST GADOBUTROL 1 MMOL/ML IV SOLN

[Series 11: tof_fl3d_tra_iso · axial · 0.6mm · 0.52mm/px · z∈[-172,-93]mm · 11 of 133 slices shown]
[im 1/133]
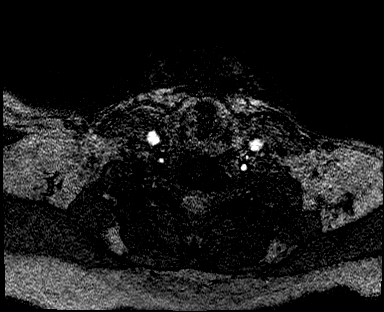
[im 14/133]
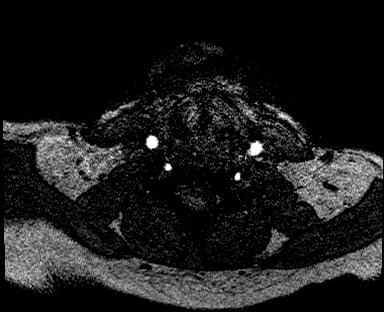
[im 27/133]
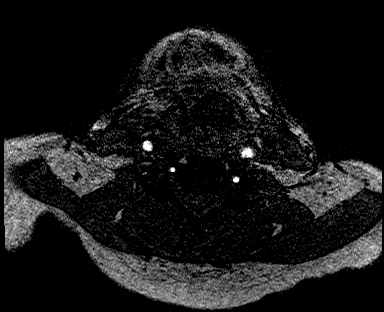
[im 40/133]
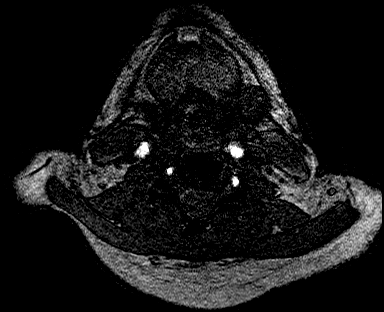
[im 53/133]
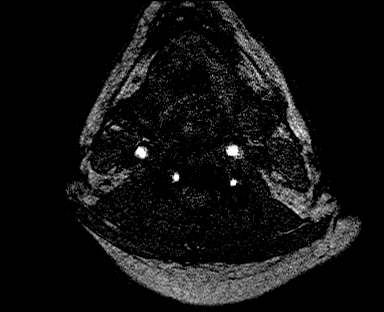
[im 67/133]
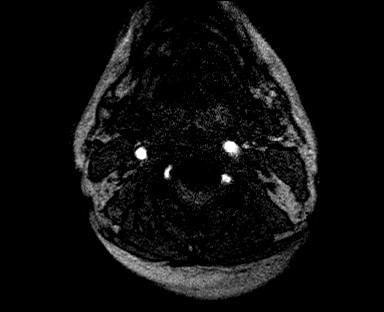
[im 80/133]
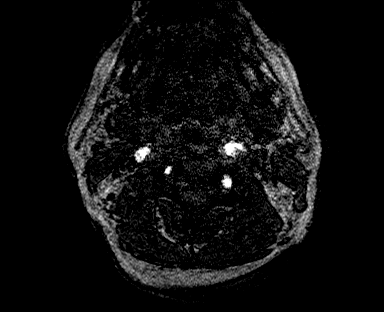
[im 93/133]
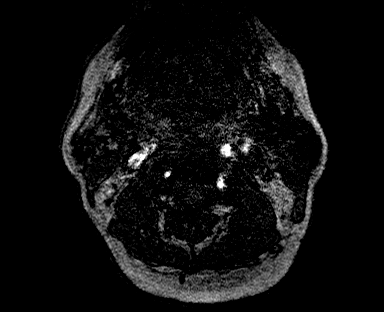
[im 106/133]
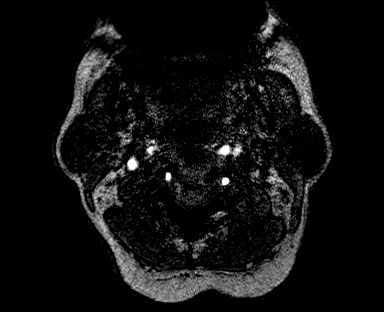
[im 119/133]
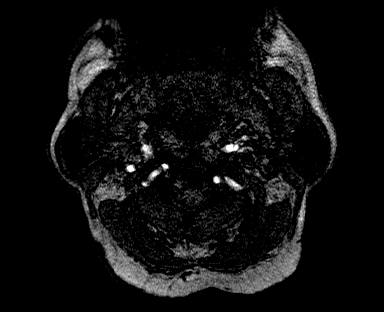
[im 133/133]
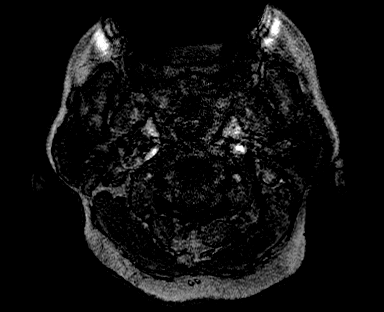

[Series 16: angio_fl3d_cor_post_ttc=3.0s · coronal · 0.9mm · 0.85mm/px · 7 of 79 slices shown (1 of 2)]
[im 1/79]
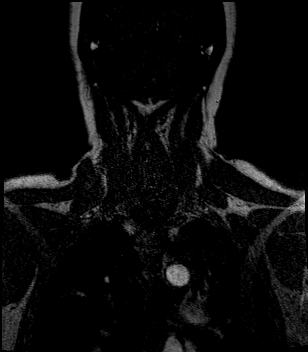
[im 14/79]
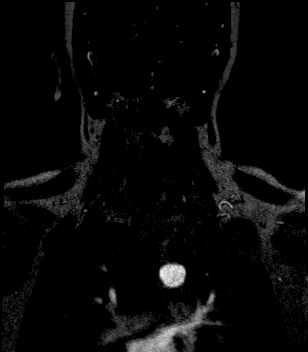
[im 27/79]
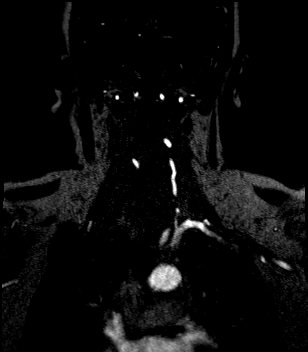
[im 40/79]
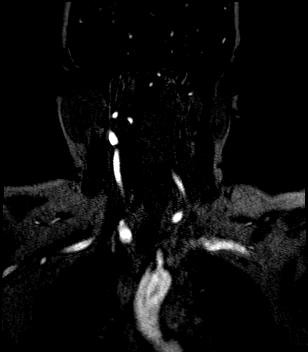
[im 53/79]
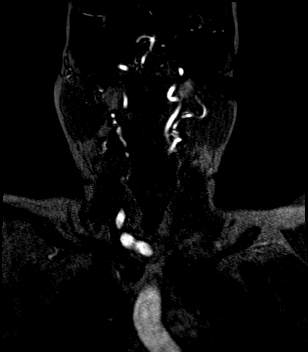
[im 66/79]
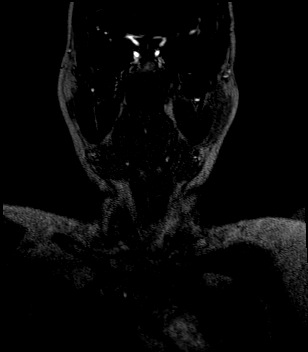
[im 79/79]
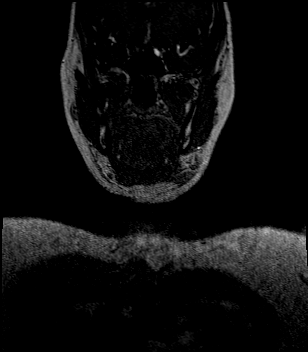

[Series 17: angio_fl3d_cor_post_ttc=3.0s_moco-adv · coronal · 0.9mm · 0.85mm/px · 6 of 79 slices shown (1 of 2)]
[im 1/79]
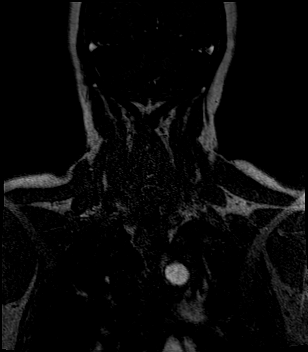
[im 16/79]
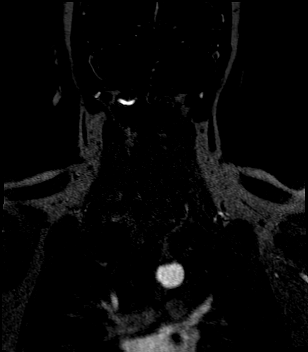
[im 32/79]
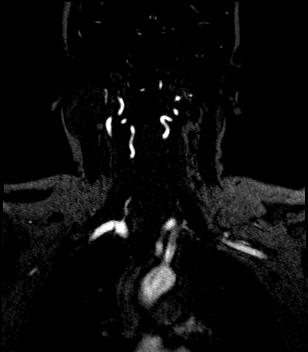
[im 47/79]
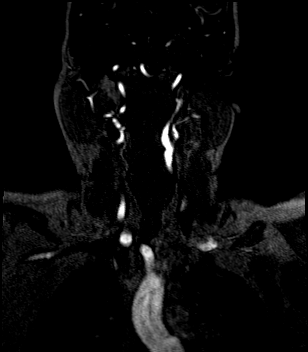
[im 63/79]
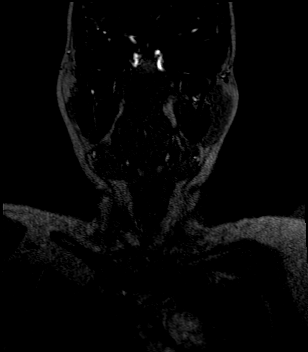
[im 79/79]
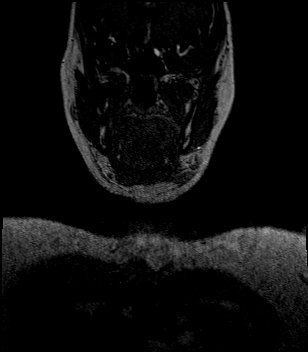

[Series 18: angio_fl3d_cor_post_ttc=3.0s_moco-adv_sub · coronal · 0.9mm · 0.85mm/px · 6 of 80 slices shown (1 of 2)]
[im 1/80]
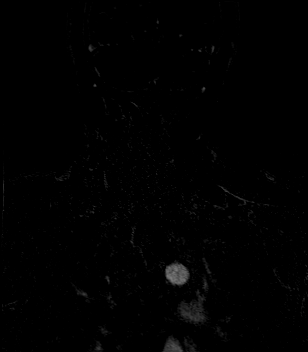
[im 16/80]
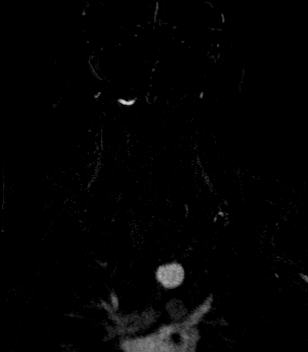
[im 32/80]
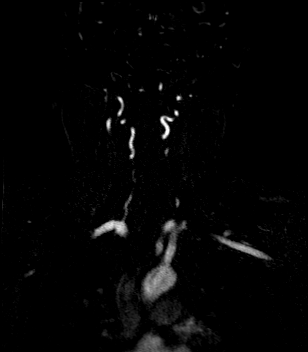
[im 48/80]
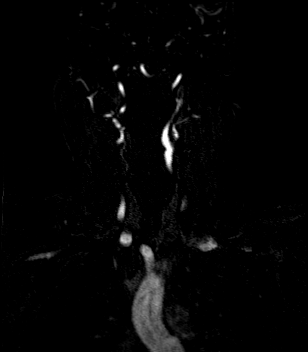
[im 64/80]
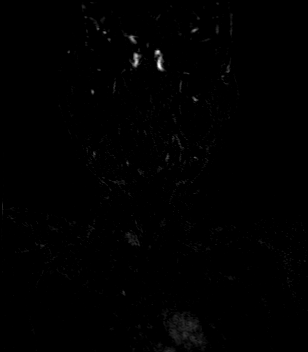
[im 80/80]
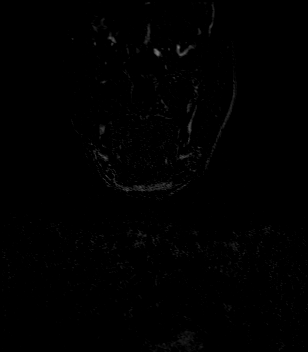

[Series 20: angio_fl3d_cor_post_ttc=3.0s · coronal · 0.9mm · 0.85mm/px · 6 of 80 slices shown (2 of 2)]
[im 1/80]
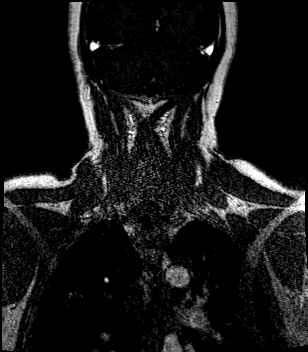
[im 16/80]
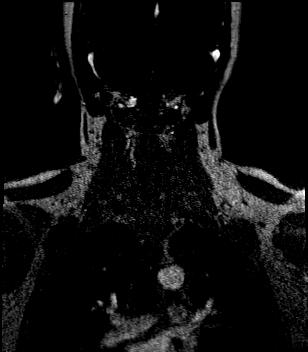
[im 32/80]
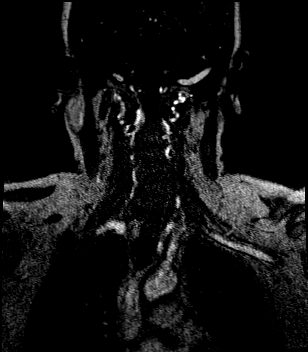
[im 48/80]
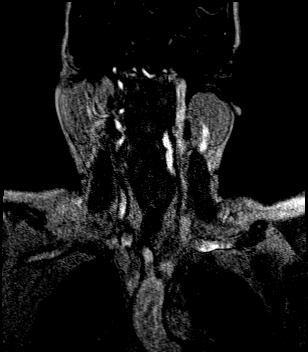
[im 64/80]
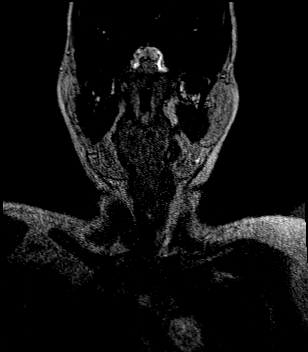
[im 80/80]
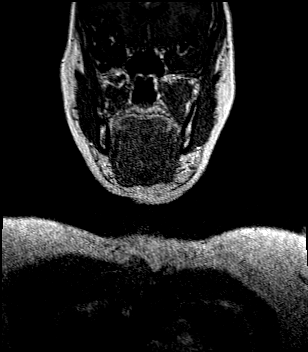

[Series 21: angio_fl3d_cor_post_ttc=3.0s_moco-adv · coronal · 0.9mm · 0.85mm/px · 6 of 80 slices shown (2 of 2)]
[im 1/80]
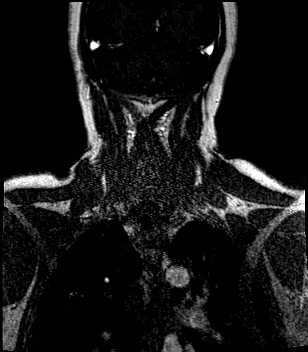
[im 16/80]
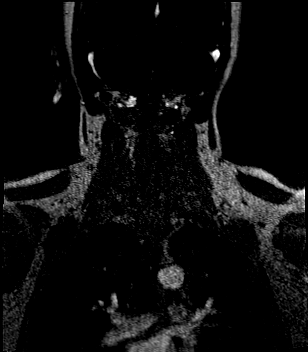
[im 32/80]
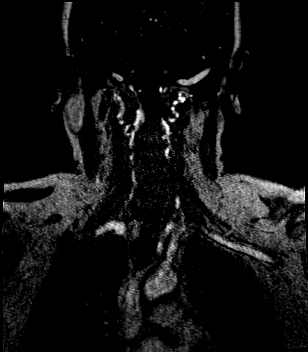
[im 48/80]
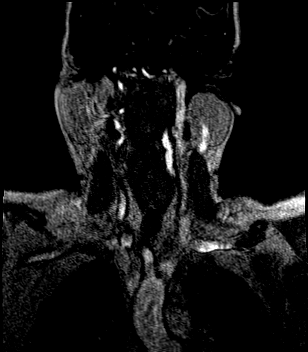
[im 64/80]
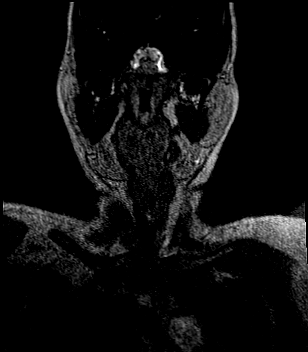
[im 80/80]
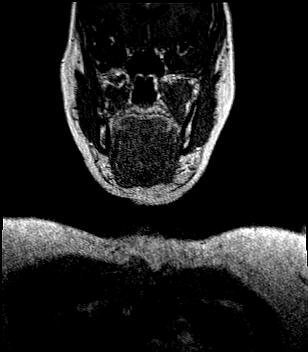

[Series 22: angio_fl3d_cor_post_ttc=3.0s_moco-adv_sub · coronal · 0.9mm · 0.85mm/px · 2 of 79 slices shown (2 of 2)]
[im 1/79]
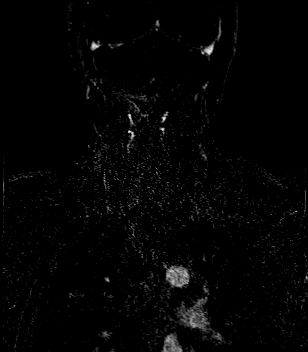
[im 16/79]
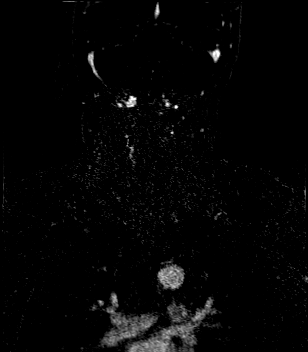

[44 of 48 positions shown; findings below may reference images not displayed]

FINDINGS: Antegrade flow in the carotid and vertebral arteries bilaterally

Mild atherosclerotic disease in the carotid bulb bilaterally without
flow limiting stenosis. Remainder of the internal carotid artery is
widely patent through the cavernous segment.

Left vertebral artery is widely patent to the basilar without
stenosis

Mild diffuse narrowing of the proximal right vertebral artery.
Remainder of the right vertebral artery is patent to the basilar.
IMPRESSION: Mild atherosclerotic disease in the carotid bowel bulb bilaterally
without significant stenosis

Mild narrowing proximal right vertebral artery. This may be due to
atherosclerotic disease or dissection. Given the posterior fossa
infarcts, consider CT angio head and neck for further evaluation.

## 2021-01-20 MED ORDER — ATORVASTATIN CALCIUM 40 MG PO TABS: 80.0000 mg | ORAL_TABLET | Freq: Every day | ORAL | Status: DC

## 2021-01-20 MED ORDER — STROKE: EARLY STAGES OF RECOVERY BOOK
Freq: Once | Status: DC
  Filled 2021-01-20: qty 1

## 2021-01-20 MED ORDER — LABETALOL HCL 5 MG/ML IV SOLN
5.0000 mg | INTRAVENOUS | Status: DC | PRN
  Administered 2021-01-20: 5 mg via INTRAVENOUS
  Filled 2021-01-20: qty 4

## 2021-01-20 MED ORDER — INSULIN ASPART 100 UNIT/ML IJ SOLN
0.0000 [IU] | Freq: Three times a day (TID) | INTRAMUSCULAR | Status: DC
  Administered 2021-01-20 – 2021-01-21 (×4): 2 [IU] via SUBCUTANEOUS
  Administered 2021-01-22: 3 [IU] via SUBCUTANEOUS
  Administered 2021-01-22 – 2021-01-25 (×6): 2 [IU] via SUBCUTANEOUS
  Administered 2021-01-25: 5 [IU] via SUBCUTANEOUS
  Administered 2021-01-26: 2 [IU] via SUBCUTANEOUS
  Administered 2021-01-26: 3 [IU] via SUBCUTANEOUS
  Administered 2021-01-26 – 2021-01-28 (×4): 2 [IU] via SUBCUTANEOUS
  Administered 2021-01-28: 3 [IU] via SUBCUTANEOUS
  Administered 2021-01-29 (×2): 2 [IU] via SUBCUTANEOUS
  Administered 2021-01-29: 8 [IU] via SUBCUTANEOUS
  Administered 2021-01-30 (×2): 2 [IU] via SUBCUTANEOUS

## 2021-01-20 MED ORDER — CLOPIDOGREL BISULFATE 75 MG PO TABS
75.0000 mg | ORAL_TABLET | Freq: Every day | ORAL | Status: DC
  Administered 2021-01-20 – 2021-01-30 (×11): 75 mg via ORAL
  Filled 2021-01-20 (×11): qty 1

## 2021-01-20 MED ORDER — ONDANSETRON HCL 4 MG/2ML IJ SOLN
4.0000 mg | Freq: Once | INTRAMUSCULAR | Status: AC
  Administered 2021-01-20: 4 mg via INTRAVENOUS
  Filled 2021-01-20: qty 2

## 2021-01-20 MED ORDER — LEVETIRACETAM 500 MG PO TABS
500.0000 mg | ORAL_TABLET | Freq: Two times a day (BID) | ORAL | Status: DC
  Administered 2021-01-20 – 2021-01-30 (×20): 500 mg via ORAL
  Filled 2021-01-20 (×20): qty 1

## 2021-01-20 MED ORDER — TOPIRAMATE 25 MG PO TABS
75.0000 mg | ORAL_TABLET | Freq: Two times a day (BID) | ORAL | Status: DC
  Administered 2021-01-20 – 2021-01-30 (×20): 75 mg via ORAL
  Filled 2021-01-20 (×20): qty 3

## 2021-01-20 MED ORDER — CLOPIDOGREL BISULFATE 75 MG PO TABS: 75.0000 mg | ORAL_TABLET | Freq: Every day | ORAL | Status: DC

## 2021-01-20 MED ORDER — IOHEXOL 350 MG/ML SOLN
50.0000 mL | Freq: Once | INTRAVENOUS | Status: AC | PRN
  Administered 2021-01-20: 50 mL via INTRAVENOUS

## 2021-01-20 MED ORDER — ESCITALOPRAM OXALATE 10 MG PO TABS
10.0000 mg | ORAL_TABLET | Freq: Every day | ORAL | Status: DC
  Administered 2021-01-20 – 2021-01-29 (×10): 10 mg via ORAL
  Filled 2021-01-20 (×10): qty 1

## 2021-01-20 MED ORDER — ACETAMINOPHEN 325 MG PO TABS: 650.0000 mg | ORAL_TABLET | ORAL | Status: DC | PRN

## 2021-01-20 MED ORDER — PANTOPRAZOLE SODIUM 40 MG PO TBEC
40.0000 mg | DELAYED_RELEASE_TABLET | Freq: Every day | ORAL | Status: DC
  Administered 2021-01-20 – 2021-01-30 (×11): 40 mg via ORAL
  Filled 2021-01-20 (×11): qty 1

## 2021-01-20 MED ORDER — ASPIRIN 81 MG PO CHEW: 81.0000 mg | CHEWABLE_TABLET | Freq: Every day | ORAL | Status: DC

## 2021-01-20 MED ORDER — GADOBUTROL 1 MMOL/ML IV SOLN
5.0000 mL | Freq: Once | INTRAVENOUS | Status: AC | PRN
  Administered 2021-01-20: 5 mL via INTRAVENOUS

## 2021-01-20 MED ORDER — EZETIMIBE 10 MG PO TABS
10.0000 mg | ORAL_TABLET | Freq: Every day | ORAL | Status: DC
  Administered 2021-01-21 – 2021-01-29 (×9): 10 mg via ORAL
  Filled 2021-01-20 (×11): qty 1

## 2021-01-20 MED ORDER — ASPIRIN EC 325 MG PO TBEC
325.0000 mg | DELAYED_RELEASE_TABLET | Freq: Once | ORAL | Status: DC
  Administered 2021-01-20: 325 mg via ORAL
  Filled 2021-01-20: qty 1

## 2021-01-20 MED ORDER — MECLIZINE HCL 25 MG PO TABS
25.0000 mg | ORAL_TABLET | Freq: Once | ORAL | Status: AC
  Administered 2021-01-20: 25 mg via ORAL
  Filled 2021-01-20: qty 1

## 2021-01-20 MED ORDER — ATORVASTATIN CALCIUM 80 MG PO TABS
80.0000 mg | ORAL_TABLET | Freq: Every day | ORAL | Status: DC
  Administered 2021-01-20 – 2021-01-30 (×11): 80 mg via ORAL
  Filled 2021-01-20: qty 1
  Filled 2021-01-20: qty 2
  Filled 2021-01-20 (×2): qty 1
  Filled 2021-01-20: qty 8
  Filled 2021-01-20 (×6): qty 1

## 2021-01-20 MED ORDER — SODIUM CHLORIDE 0.9% FLUSH
3.0000 mL | Freq: Once | INTRAVENOUS | Status: AC
  Administered 2021-01-20: 3 mL via INTRAVENOUS

## 2021-01-20 MED ORDER — ATORVASTATIN CALCIUM 40 MG PO TABS: 40.0000 mg | ORAL_TABLET | Freq: Every day | ORAL | Status: DC

## 2021-01-20 MED ORDER — LABETALOL HCL 5 MG/ML IV SOLN: 5.0000 mg | INTRAVENOUS | Status: DC | PRN

## 2021-01-20 NOTE — ED Notes (Signed)
Returned from MRI and placed back on cardiac monitor.

## 2021-01-20 NOTE — ED Notes (Signed)
Awaiting for pharmacy to send zetia

## 2021-01-20 NOTE — H&P (Signed)
History and Physical    Samantha Huffman LXB:[id number] DOB: 1955/09/10 DOA: 01/20/2021  PCP: Alroy Dust, L.Marlou Sa, MD    Patient coming from:  Home   Chief Complaint:  Rt arm weakness   HPI: Samantha Huffman is a 65 y.o. female seen in ed with complaints of  Unsteady gait and right arm and right leg weakness since Sunday. Pt is post BL cataract sx and was lifting water case  over weekend and since then this has started. Pt does reports headache and balance issue and no chest pain sob, or bleeding or abdominal or urinary issue. Pt states that Sunday night she realized the case of water was in the truck so she went to get it and when she put th e case down she has sharp pain on rt side of her neck and she returned to bed and then in am woke up and realized her rt arm and leg was not feeling the same and both her daughter were there to bathe her and feed her but she has been feeling tired and so came to er.  Pt has past medical history of HTN, DM II, Seizure,GERD.  ED Course:  Vitals:   01/20/21 1515 01/20/21 1530 01/20/21 1545 01/20/21 1601  BP:    (!) 196/87  Pulse: 71 69 (!) 59 68  Resp: _0 Temp:      SpO2: 98% 100% 100% 99%  In ed pt is alert,awake and oriented and Hypertensive. No gross deficit.  Labs shows normal CMP except for glucose of 164 and alk phos of 127 normal CBC with a hemoglobin of 16.5.  CT head noncontrast shows an acute to subacute right cerebellar infarct. Neurology consulted in the emergency room and patient to be admitted for acute ischemic CVA evaluation and MRI of the brain.  Review of Systems:  Review of Systems  Constitutional: Positive for malaise/fatigue.  Eyes: Positive for blurred vision.  Neurological: Positive for weakness.  All other systems reviewed and are negative.    Past Medical History:  Diagnosis Date  . Carotid artery stenosis    1-39% bilateral stenosis by dopplers 08/2020  . Coronary artery disease    a. s/p CABG 10/2012.  cath  05/2016 with moderate LM stenosis, mild LCx and RCA stenosis and occluded LAD with patent LIMA to LAD, free radial to OM.  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  Marland Kitchen Dyslipidemia   . Family history of adverse reaction to anesthesia    mother and sister ponv  . Generalized convulsive epilepsy without mention of intractable epilepsy   . GERD (gastroesophageal reflux disease)   . History of kidney stones   . Hypertension   . Memory deficit    slight  . Migraine    "controlled on daily RX " (06/11/2016)  . Nephrolithiasis 07/30/2015   S/p lithotripsy x 3 and right and left ureter stents  . OSA (obstructive sleep apnea)    cpap   . PONV (postoperative nausea and vomiting)   . Seizures ([place])    "controlled w/daily RX; started in 2012; dr thinks they might be from the migraines; last sz was early part of 2016" (06/11/2016)  . Type II diabetes mellitus (HCC)    metformin  . Vasovagal syncope 07/30/2015   2011    Past Surgical History:  Procedure Laterality Date  . CARDIAC CATHETERIZATION  2014; 06/11/2016  . CARDIAC CATHETERIZATION N/A 06/11/2016   Procedure: Left Heart Cath and Cors/Grafts Angiography;  Surgeon: Sherren Mocha,  MD;  Location: [facility];  Service: Cardiovascular;  Laterality: N/A;  . CESAREAN SECTION    . CORONARY ANGIOPLASTY    . CORONARY ARTERY BYPASS GRAFT  March, 2014   LIMA to LAD, left radial to LCx x 2  . CYSTOSCOPY W/ URETERAL STENT PLACEMENT Left 01/16/2019   Procedure: CYSTOSCOPY WITH RETROGRADE PYELOGRAM/URETERAL LEFT STENT PLACEMENT;  Surgeon: Ardis Hughs, MD;  Location: [facility];  Service: Urology;  Laterality: Left;  . CYSTOSCOPY/URETEROSCOPY/HOLMIUM LASER/STENT PLACEMENT Left 01/24/2019   Procedure: CYSTOSCOPY, LEFT URETEROSCOPY, HOLMIUM LASER, STONE REMOVAL AND STENT EXCHANGE;  Surgeon: Ardis Hughs, MD;  Location: [facility];  Service: Urology;  Laterality: Left;  . LITHOTRIPSY  X 3  . RIGHT/LEFT HEART CATH AND CORONARY/GRAFT  ANGIOGRAPHY N/A 09/08/2018   Procedure: RIGHT/LEFT HEART CATH AND CORONARY/GRAFT ANGIOGRAPHY;  Surgeon: Martinique, Peter M, MD;  Location: [facility];  Service: Cardiovascular;  Laterality: N/A;  . TOTAL ABDOMINAL HYSTERECTOMY     ovaries took before hysterectomy  . TUBAL LIGATION    . URETERAL STENT PLACEMENT       reports that she has been smoking cigarettes. She has a 11.55 pack-year smoking history. She quit smokeless tobacco use about 8 years ago.  Her smokeless tobacco use included snuff and chew. She reports current alcohol use. She reports that she does not use drugs.  Allergies  Allergen Reactions  . Depakote [Divalproex Sodium] Other (See Comments)    Hyperammonemia  . Other Other (See Comments)  . Penicillins Hives, Other (See Comments) and Rash    Other reaction(s): GI intolerance Has patient had a PCN reaction causing immediate rash, facial/tongue/throat swelling, SOB or lightheadedness with hypotension: YES Has patient had a PCN reaction causing severe rash involving mucus membranes or skin necrosis: NO Has patient had a PCN reaction that required hospitalizationNO Has patient had a PCN reaction occurring within the last 10 years: NO If all of the above answers are "NO", then may proceed with Cephalosporin use. Other reaction(s): GI Upset (intolerance) Other reaction(s): GI intolerance Has patient had a PCN reaction causing immediate rash, facial/tongue/throat swelling, SOB or lightheadedness with hypotension: YES Has patient had a PCN reaction causing severe rash involving mucus membranes or skin necrosis: NO Has patient had a PCN reaction that required hospitalizationNO Has patient had a PCN reaction occurring within the last 10 years: NO If all of the above answers are "NO", then may proceed with Cephalosporin use.    Family History  Problem Relation Age of Onset  . Cancer Father   . Diabetes Father   . Diabetes Brother   . Stroke Brother   . Diabetes Brother    . Heart disease Brother        CAD with CABG  . Heart disease Sister        CAD with MI  . Heart attack Sister     Prior to Admission medications   Medication Sig Start Date End Date Taking? Authorizing Provider  acetaminophen (TYLENOL) 325 MG tablet Take 2 tablets (650 mg total) by mouth every 4 (four) hours as needed for headache or mild pain. 06/11/16   Isaiah Serge, NP  Alirocumab (PRALUENT) 75 MG/ML SOAJ INJECT CONTENTS OF 1 PEN INTO THE SKIN EVERY 14 DAYS 08/18/20   Sueanne Margarita, MD  ALPRAZolam Duanne Moron) 0.25 MG tablet Take 0.25 mg by mouth 3 (three) times daily as needed for anxiety.  01/18/17   [provider]  aspirin EC 81 MG tablet  Take 81 mg by mouth daily.    [provider]  atorvastatin (LIPITOR) 80 MG tablet Take 1 tablet (80 mg total) by mouth daily. 08/18/20   Sueanne Margarita, MD  carvedilol (COREG) 12.5 MG tablet Take 1 tablet (12.5 mg total) by mouth 2 (two) times daily. 08/18/20   Sueanne Margarita, MD  clopidogrel (PLAVIX) 75 MG tablet Take 1 tablet (75 mg total) by mouth daily. 08/18/20   Sueanne Margarita, MD  clotrimazole-betamethasone (LOTRISONE) cream Apply 1 application topically daily as needed (skin folds (under breasts)).  01/13/19   [provider]  escitalopram (LEXAPRO) 10 MG tablet Take 10 mg by mouth at bedtime.     [provider]  ezetimibe (ZETIA) 10 MG tablet Take 1 tablet (10 mg total) by mouth at bedtime. 08/18/20   Sueanne Margarita, MD  furosemide (LASIX) 20 MG tablet 1 tablet    [provider]  isosorbide mononitrate (IMDUR) 120 MG 24 hr tablet Take 1 tablet (120 mg total) by mouth daily. 08/18/20   Sueanne Margarita, MD  levETIRAcetam (KEPPRA) 500 MG tablet Take 1 tablet (500 mg total) by mouth 2 (two) times daily. 10/06/20   Suzzanne Cloud, NP  lisinopril (ZESTRIL) 20 MG tablet Take 1 tablet (20 mg total) by mouth daily. 08/18/20   Sueanne Margarita, MD  metFORMIN (GLUCOPHAGE-XR) 500 MG 24 hr tablet Take 500  mg by mouth daily with breakfast.  06/29/18   [provider]  Nicotine 21-14-7 MG/24HR KIT Use as directed 02/19/20   Imogene Burn, PA-C  nitroGLYCERIN (NITROSTAT) 0.4 MG SL tablet Place 1 tablet (0.4 mg total) under the tongue every 5 (five) minutes x 3 doses as needed for chest pain. 06/11/16   Isaiah Serge, NP  pantoprazole (PROTONIX) 40 MG tablet TAKE 1 TABLET BY MOUTH EVERY DAY 01/09/19   Sueanne Margarita, MD  Potassium Citrate 15 MEQ (1620 MG) TBCR Take 1 tablet by mouth in the morning and at bedtime.    [provider]  topiramate (TOPAMAX) 25 MG tablet Take 3 tablets (75 mg total) by mouth 2 (two) times daily. 10/06/20   Suzzanne Cloud, NP    Physical Exam: Vitals:   01/20/21 1515 01/20/21 1530 01/20/21 1545 01/20/21 1601  BP:    (!) 196/87  Pulse: 71 69 (!) 59 68  Resp: _0 Temp:      SpO2: 98% 100% 100% 99%   Physical Exam Vitals and nursing note reviewed.  Constitutional:      General: She is not in acute distress.    Appearance: Normal appearance. She is not ill-appearing, toxic-appearing or diaphoretic.  HENT:     Head: Normocephalic and atraumatic.     Right Ear: External ear normal.     Left Ear: External ear normal.     Nose: Nose normal.     Mouth/Throat:     Mouth: Mucous membranes are moist.  Eyes:     General: No visual field deficit.    Extraocular Movements: Extraocular movements intact.     Pupils: Pupils are equal, round, and reactive to light.  Neck:     Vascular: No carotid bruit.  Cardiovascular:     Rate and Rhythm: Normal rate and regular rhythm.     Pulses: Normal pulses.     Heart sounds: Normal heart sounds.  Pulmonary:     Effort: Pulmonary effort is normal.     Breath sounds: Normal breath  sounds.  Abdominal:     General: Bowel sounds are normal. There is no distension.     Palpations: Abdomen is soft.     Tenderness: There is no abdominal tenderness. There is no guarding.  Musculoskeletal:        General:  Normal range of motion.  Neurological:     Mental Status: She is alert and oriented to person, place, and time.     Cranial Nerves: Cranial nerves are intact. No cranial nerve deficit, dysarthria or facial asymmetry.     Sensory: Sensation is intact.     Motor: Weakness present. No tremor, abnormal muscle tone or pronator drift.     Coordination: Finger-Nose-Finger Test abnormal and Heel to [facility] Test abnormal.     Deep Tendon Reflexes: Reflexes abnormal.     Reflex Scores:      Bicep reflexes are 1+ on the left side.      Patellar reflexes are 1+ on the right side and 1+ on the left side.    Comments: Abnl finger to nose on right and heel to shin on right abnormal   Psychiatric:        Mood and Affect: Mood normal.        Behavior: Behavior normal.      Labs on Admission: I have personally reviewed following labs and imaging studies  No results for input(s): CKTOTAL, CKMB, TROPONINI in the last 72 hours. Lab Results  Component Value Date   WBC 8.1 01/20/2021   HGB 16.7 (H) 01/20/2021   HCT 49.0 (H) 01/20/2021   MCV 95.4 01/20/2021   PLT 220 01/20/2021    Recent Labs  Lab 01/20/21 1240 01/20/21 1248  NA 137 140  K 4.2 4.3  CL 105 106  CO2 23  --   BUN 14 16  CREATININE 0.98 0.90  CALCIUM 10.1  --   PROT 8.1  --   BILITOT 0.8  --   ALKPHOS 127*  --   ALT 17  --   AST 21  --   GLUCOSE 164* 164*   Lab Results  Component Value Date   CHOL 117 08/18/2020   HDL 55 08/18/2020   LDLCALC 44 08/18/2020   TRIG 99 08/18/2020   No results found for: DDIMER Invalid input(s): POCBNP  Urinalysis    Component Value Date/Time   COLORURINE YELLOW 01/05/2020 1625   APPEARANCEUR CLEAR 01/05/2020 1625   LABSPEC 1.008 01/05/2020 1625   PHURINE 5.0 01/05/2020 1625   GLUCOSEU NEGATIVE 01/05/2020 1625   HGBUR LARGE (A) 01/05/2020 1625   BILIRUBINUR NEGATIVE 01/05/2020 1625   KETONESUR NEGATIVE 01/05/2020 1625   PROTEINUR NEGATIVE 01/05/2020 1625   NITRITE NEGATIVE 01/05/2020  1625   LEUKOCYTESUR NEGATIVE 01/05/2020 1625   COVID-19 Labs  No results for input(s): DDIMER, FERRITIN, LDH, CRP in the last 72 hours.  Lab Results  Component Value Date   [place] NEGATIVE 01/06/2020   SARSCOV2NAA NOT DETECTED 01/22/2019   [place] NEGATIVE 01/16/2019    Radiological Exams on Admission: CT HEAD WO CONTRAST  Result Date: 01/20/2021 CLINICAL DATA:  Unsteady gait and leg weakness. EXAM: CT HEAD WITHOUT CONTRAST TECHNIQUE: Contiguous axial images were obtained from the base of the skull through the vertex without intravenous contrast. COMPARISON:  None. FINDINGS: Brain: There is a 4 cm infarct in the superior aspect of the right cerebellar hemisphere consistent with an acute to subacute appearance. Cytotoxic edema results in mild local mass effect. There is also likely a separate subcentimeter subacute infarct  more posteriorly in the right cerebellar hemisphere. No intracranial hemorrhage, midline shift, or extra-axial fluid collection is identified. The ventricles are normal in size. Vascular: Calcified atherosclerosis at the skull base. Skull: No fracture or suspicious osseous lesion. Sinuses/Orbits: Minimal bilateral ethmoid air cell mucosal thickening. Clear mastoid air cells. Bilateral cataract extraction. Other: None. 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 Electronically Signed   By: Logan Bores M.D.   On: 01/20/2021 13:43    EKG: Independently reviewed.  SR 73 with incomplete rbbb, twi in ii,iii,avf, and v3-v6.    Assessment/Plan Principal Problem:   Acute cerebrovascular accident (CVA) ([place]) Active Problems:   Diabetes mellitus without complication (HCC)   Dyslipidemia   Seizures (HCC)   CAD (coronary artery disease)   Essential hypertension   Carotid artery stenosis, asymptomatic   Tobacco abuse Acute ischemic CVA:  Admit to med telemetry. Aspirin 81 daily, Lipitor 40, a.m. lipid panel. Permissive hypertension with systolic goals  of 165'E. Greatly appreciate neurology consult management and care.   PT, OT.  Aspiration precautions.  Diabetes mellitus without complication: Sliding scale insulin, A1c. Accu-Cheks and hypoglycemia protocol.  Dyslipidemia: A.m. lipid panel.  Lipitor 40.  Seizures: seizure precautions. Jodi Marble continued.   CAD:  Continue patient on aspirin and statin therapy. Blocker therapy held for permissive hypertension for the next 24 to 48 hours.    Hypertension: As needed labetalol for systolic blood pressure of 220 and above. Home regimen of Coreg 12.5, Lasix 20 mg, Imdur 120 mg all held for permissive hypertension.    Carotid artery stenosis:  Optimization with anticoagulation and antihyperlipidemic treatment plan. Consider vascular surgery consult as outpatient.  Tobacco abuse: Counseling once patient is stable.   DVT prophylaxis:  Heparin  Code Status:  Full Code.    Family Communication:  Tilda Franco (Daughter)  [phone] (Mobile)  Disposition Plan:  TBD  Consults called:  Neurology-Dr.Bhagat.  Admission status: Inpatient    Para Skeans MD [facility] [phone] How to contact the [facility] Attending or Consulting provider [place] or covering provider during after hours [place], for this patient.    1. Check the care team in [facility] and look for a) attending/consulting [place] provider listed and b) the [facility] team listed 2. Log into [email or web address] and use [place]'s universal password to access. If you do not have the password, please contact the hospital operator. 3. Locate the [facility] provider you are looking for under [facility] and page to a number that you can be directly reached. 4. If you still have difficulty reaching the provider, please page the [facility] (Director on Call) for the Hospitalists listed on amion for assistance. [email or web address] Password [email or web address] 01/20/2021, 4:35 PM

## 2021-01-20 NOTE — ED Provider Notes (Signed)
Emergency Medicine Provider Triage Evaluation Note  Samantha Huffman , a 65 y.o. female  was evaluated in triage.  Pt complains of feeling off balanced when she walks as well as a heaviness in her right arm, right leg, and left leg. She reports symptoms started after lifting a case of 24-30 water bottles. She also complains of a headache. Recent bilateral cataract surgery.  Review of Systems  Positive: + HA, feeling off balanced Negative: - numbness, pain  Physical Exam  BP (!) 175/119 (BP Location: Left Arm)   Pulse 96   Temp 98.3 F (36.8 C)   Resp 16   SpO2 100%  Gen:   Awake, no distress   Resp:  Normal effort  MSK:   Moves extremities without difficulty  Other:  Neuro intact. Normal strength bilaterally.   Medical Decision Making  Medically screening exam initiated at 12:42 PM.  Appropriate orders placed.  Samantha Huffman was informed that the remainder of the evaluation will be completed by another provider, this initial triage assessment does not replace that evaluation, and the importance of remaining in the ED until their evaluation is complete.     Eustaquio Maize, PA-C 01/20/21 1243    Isla Pence, MD 01/20/21 [phone]

## 2021-01-20 NOTE — Consult Note (Addendum)
Neurology Consultation Reason for Consult:stroke Referring Physician: Dr Isla Pence  CC: weakness, dizziness  History is obtained from:patient  HPI: Samantha Huffman is a 65 y.o. right-handed female with past medical history of hypertension, hypercholesterolemia, CABG in 2014 on aspirin and Plavix, migraine, epilepsy on Topamax and Keppra, nicotine use disorder who presented to the emergency room due to feeling dizzy and weak.  Patient states on 01/19/2021 at around [place] AM she realized that she left carton of water bottles in her car.  She went to get them and on her way back suddenly started feeling dizzy as if everything is spinning and weak.  She went to her bedroom to lay down.  Next morning she continued to feel dizzy and therefore asked her daughter to come help her.  Her symptoms persisted today and therefore she presented to the ED.  Last known normal: 01/19/2021 at around 00:30 AM Blood pressure on arrival 175/119 (unable to take her medications today) Blood glucose 164  NIHSS: 2 for ataxia  ROS: All other systems reviewed and negative except as noted in the HPI.   Past Medical History:  Diagnosis Date  . Carotid artery stenosis    1-39% bilateral stenosis by dopplers 08/2020  . Coronary artery disease    a. s/p CABG 10/2012.  cath 05/2016 with moderate LM stenosis, mild LCx and RCA stenosis and occluded LAD with patent LIMA to LAD, free radial to OM.  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  Marland Kitchen Dyslipidemia   . Family history of adverse reaction to anesthesia    mother and sister ponv  . Generalized convulsive epilepsy without mention of intractable epilepsy   . GERD (gastroesophageal reflux disease)   . History of kidney stones   . Hypertension   . Memory deficit    slight  . Migraine    "controlled on daily RX " (06/11/2016)  . Nephrolithiasis 07/30/2015   S/p lithotripsy x 3 and right and left ureter stents  . OSA (obstructive sleep apnea)    cpap   . PONV  (postoperative nausea and vomiting)   . Seizures ([place])    "controlled w/daily RX; started in 2012; dr thinks they might be from the migraines; last sz was early part of 2016" (06/11/2016)  . Type II diabetes mellitus (HCC)    metformin  . Vasovagal syncope 07/30/2015   2011    Family History  Problem Relation Age of Onset  . Cancer Father   . Diabetes Father   . Diabetes Brother   . Stroke Brother   . Diabetes Brother   . Heart disease Brother        CAD with CABG  . Heart disease Sister        CAD with MI  . Heart attack Sister    Social History:  reports that she has been smoking cigarettes. She has a 11.55 pack-year smoking history. She quit smokeless tobacco use about 8 years ago.  Her smokeless tobacco use included snuff and chew. She reports current alcohol use. She reports that she does not use drugs.    Exam: Current vital signs: BP (!) 175/119 (BP Location: Left Arm)   Pulse 96   Temp 98.3 F (36.8 C)   Resp 16   SpO2 100%  Vital signs in last 24 hours: Temp:  [98.3 F (36.8 C)] 98.3 F (36.8 C) (05/31 1235) Pulse Rate:  [96] 96 (05/31 1235) Resp:  [16] 16 (05/31 1235) BP: (175)/(119) 175/119 (05/31 1235) SpO2:  [  100 %] 100 % (05/31 1235)   Physical Exam  Constitutional: Appears well-developed and well-nourished.  Psych: Affect appropriate to situation Eyes: No scleral injection HENT: No OP obstrucion Head: Normocephalic.  Cardiovascular: Normal rate and regular rhythm.  Respiratory: Effort normal, non-labored breathing GI: Soft.  No distension. There is no tenderness.  Skin: Warm  Neuro: Mental Status: Patient is awake, alert, oriented to time, place, person  Patient is able to give a clear and coherent history. No signs of aphasia or neglect Cranial Nerves: II: Visual Fields are full. Pupils are equal, round, and reactive to light.   III,IV, VI: EOMI without ptosis or diploplia.  V: Facial sensation is symmetric to temperature VII: Facial  movement is symmetric.  VIII: hearing is intact to voice X: Uvula elevates symmetrically XI: Shoulder shrug is symmetric. XII: tongue is midline without atrophy or fasciculations.  Motor: Tone is normal. Bulk is normal. 5/5 strength was present in all four extremities.  Sensory: Sensation is symmetric to light touch and temperature in the arms and legs. Deep Tendon Reflexes: 2+ and symmetric in the biceps and patellae.  Plantars: Toes are downgoing bilaterally.  Cerebellar: Ataxia in right upper extremity and right lower extremity with finger-to-nose and heel-to-shin   I have reviewed labs in epic and the results pertinent to this consultation are: CBC:  Recent Labs  Lab 01/20/21 1240 01/20/21 1248  WBC 8.1  --   NEUTROABS 4.7  --   HGB 16.5* 16.7*  HCT 49.3* 49.0*  MCV 95.4  --   PLT 220  --     Basic Metabolic Panel:  Lab Results  Component Value Date   NA 140 01/20/2021   K 4.3 01/20/2021   CO2 23 01/20/2021   GLUCOSE 164 (H) 01/20/2021   BUN 16 01/20/2021   CREATININE 0.90 01/20/2021   CALCIUM 10.1 01/20/2021   GFRNONAA >60 01/20/2021   GFRAA >60 01/09/2020   Lipid Panel:  Lab Results  Component Value Date   LDLCALC 44 08/18/2020   HgbA1c:  Lab Results  Component Value Date   HGBA1C 7.6 (H) 01/05/2020   Urine Drug Screen:     Component Value Date/Time   LABOPIA NONE DETECTED 11/08/2017 2055   [place] DETECTED 11/08/2017 2055   [place] DETECTED 11/08/2017 2055   AMPHETMU NONE DETECTED 11/08/2017 2055   [place] NONE DETECTED 11/08/2017 2055   LABBARB NONE DETECTED 11/08/2017 2055    Alcohol Level     Component Value Date/Time   ETH <10 11/08/2017 2058     I have reviewed the images obtained:  CT Head without contrast 01/20/2021: Acute to subacute right cerebellar infarct.     ASSESSMENT/PLAN: 19 old female presented with sudden onset of dizziness and weakness that started on 01/19/2021 at around [place].   Acute/subacute right  cerebellar stroke Hypertension Hyperlipidemia Nicotine use disorder -Etiology: Under investigation -Not a candidate for tPA and thrombectomy as patient outside window -CT head without contrast showed acute to subacute right cerebellar infarct -MRI brain without contrast, MRA head and neck ordered and pending -Ordered TTE, LDL, A1c -Goal blood pressure: Normotension - NIHSS Q4hours -Recommend swallow eval followed by heart healthy diet if cleared -Continue aspirin 81 mg and Plavix 75 mg daily (already on these at home) -Continue atorvastatin 80mg for hyperlipidemia -Smoking cessation counseling - PT/OT for rehab  Epilepsy - Continue on Keppra and Topamax     Thank you for allowing [place] to participate in the care of this patient. If you have  any further questions, please contact  me or neurohospitalist.   Zeb Comfort Epilepsy [facility]

## 2021-01-20 NOTE — ED Triage Notes (Signed)
Patient complains of unsteady gait and feeling like her arms and legs will not do what she wants them to since Sunday. Patient states recent cataract surgery and lifted heavy water container over the weekend and symptoms started following this. Alert and oriented, no obvious deficits

## 2021-01-20 NOTE — ED Notes (Signed)
Visitor at bedside.

## 2021-01-20 NOTE — ED Notes (Signed)
Patient transported to MRI 

## 2021-01-20 NOTE — ED Provider Notes (Signed)
[facility] EMERGENCY DEPARTMENT Provider Note   CSN: [id number] Arrival date & time: 01/20/21  1221     History No chief complaint on file.   Rogue Pautler is a 65 y.o. female.  Pt presents to the ED today with dizziness and feeling like her right arm and leg are not working right.  She feels very off balanced when she walks.  She said sx started around 0030 Monday (5/30) am.  She was unable to get up and do anything yesterday, so her daughter came over and helped her get a bath and dressed.  She thought it was due to having cataract surgery recently (right eye 2 weeks ago; left eye last week) and lifting a heavy case of water bottles.  Pt is supposed to be on multiple medications, but does not take them like she should because they are expensive.  She said she tries to stretch them out.          Past Medical History:  Diagnosis Date  . Carotid artery stenosis    1-39% bilateral stenosis by dopplers 08/2020  . Coronary artery disease    a. s/p CABG 10/2012.  cath 05/2016 with moderate LM stenosis, mild LCx and RCA stenosis and occluded LAD with patent LIMA to LAD, free radial to OM.  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  Marland Kitchen Dyslipidemia   . Family history of adverse reaction to anesthesia    mother and sister ponv  . Generalized convulsive epilepsy without mention of intractable epilepsy   . GERD (gastroesophageal reflux disease)   . History of kidney stones   . Hypertension   . Memory deficit    slight  . Migraine    "controlled on daily RX " (06/11/2016)  . Nephrolithiasis 07/30/2015   S/p lithotripsy x 3 and right and left ureter stents  . OSA (obstructive sleep apnea)    cpap   . PONV (postoperative nausea and vomiting)   . Seizures ([place])    "controlled w/daily RX; started in 2012; dr thinks they might be from the migraines; last sz was early part of 2016" (06/11/2016)  . Type II diabetes mellitus (HCC)    metformin  . Vasovagal syncope 07/30/2015    2011    Patient Active Problem List   Diagnosis Date Noted  . GERD (gastroesophageal reflux disease)   . Coronary artery disease   . Depression 01/06/2020  . Pneumonia 01/05/2020  . History of chest pain 09/26/2018  . Dyspnea on exertion 09/08/2018  . Chest pain, rule out acute myocardial infarction 10/30/2017  . Edema 01/25/2017  . Tobacco abuse 06/10/2016  . Headache 09/15/2015  . OSA (obstructive sleep apnea) 07/30/2015  . Nephrolithiasis 07/30/2015  . Vasovagal syncope 07/30/2015  . Carotid artery stenosis, asymptomatic 07/30/2015  . CAD (coronary artery disease) 08/22/2013  . Essential hypertension 08/22/2013  . Diabetes mellitus without complication ([place])   . Dyslipidemia   . Seizures ([place])   . Generalized convulsive epilepsy ([place]) 03/22/2013  . Memory deficit 03/22/2013    Past Surgical History:  Procedure Laterality Date  . CARDIAC CATHETERIZATION  2014; 06/11/2016  . CARDIAC CATHETERIZATION N/A 06/11/2016   Procedure: Left Heart Cath and Cors/Grafts Angiography;  Surgeon: Sherren Mocha, MD;  Location: [facility];  Service: Cardiovascular;  Laterality: N/A;  . CESAREAN SECTION    . CORONARY ANGIOPLASTY    . CORONARY ARTERY BYPASS GRAFT  March, 2014   LIMA to LAD, left radial to LCx x 2  .  CYSTOSCOPY W/ URETERAL STENT PLACEMENT Left 01/16/2019   Procedure: CYSTOSCOPY WITH RETROGRADE PYELOGRAM/URETERAL LEFT STENT PLACEMENT;  Surgeon: Ardis Hughs, MD;  Location: [facility];  Service: Urology;  Laterality: Left;  . CYSTOSCOPY/URETEROSCOPY/HOLMIUM LASER/STENT PLACEMENT Left 01/24/2019   Procedure: CYSTOSCOPY, LEFT URETEROSCOPY, HOLMIUM LASER, STONE REMOVAL AND STENT EXCHANGE;  Surgeon: Ardis Hughs, MD;  Location: [facility];  Service: Urology;  Laterality: Left;  . LITHOTRIPSY  X 3  . RIGHT/LEFT HEART CATH AND CORONARY/GRAFT ANGIOGRAPHY N/A 09/08/2018   Procedure: RIGHT/LEFT HEART CATH AND CORONARY/GRAFT ANGIOGRAPHY;  Surgeon: Martinique, Peter M, MD;   Location: [facility];  Service: Cardiovascular;  Laterality: N/A;  . TOTAL ABDOMINAL HYSTERECTOMY     ovaries took before hysterectomy  . TUBAL LIGATION    . URETERAL STENT PLACEMENT       OB History   No obstetric history on file.     Family History  Problem Relation Age of Onset  . Cancer Father   . Diabetes Father   . Diabetes Brother   . Stroke Brother   . Diabetes Brother   . Heart disease Brother        CAD with CABG  . Heart disease Sister        CAD with MI  . Heart attack Sister     Social History   Tobacco Use  . Smoking status: Current Some Day Smoker    Packs/day: 0.33    Years: 35.00    Pack years: 11.55    Types: Cigarettes    Last attempt to quit: 06/11/2016    Years since quitting: 4.6  . Smokeless tobacco: Former Systems developer    Types: Snuff, Sarina Ser    Quit date: 11/20/2012  Vaping Use  . Vaping Use: Never used  Substance Use Topics  . Alcohol use: Yes    Comment: 06/11/2016 "might have a few drinks/month; usually wine"  . Drug use: No    Home Medications Prior to Admission medications   Medication Sig Start Date End Date Taking? Authorizing Provider  acetaminophen (TYLENOL) 325 MG tablet Take 2 tablets (650 mg total) by mouth every 4 (four) hours as needed for headache or mild pain. 06/11/16   Isaiah Serge, NP  Alirocumab (PRALUENT) 75 MG/ML SOAJ INJECT CONTENTS OF 1 PEN INTO THE SKIN EVERY 14 DAYS 08/18/20   Sueanne Margarita, MD  ALPRAZolam Duanne Moron) 0.25 MG tablet Take 0.25 mg by mouth 3 (three) times daily as needed for anxiety.  01/18/17   [provider]  aspirin EC 81 MG tablet Take 81 mg by mouth daily.    [provider]  atorvastatin (LIPITOR) 80 MG tablet Take 1 tablet (80 mg total) by mouth daily. 08/18/20   Sueanne Margarita, MD  carvedilol (COREG) 12.5 MG tablet Take 1 tablet (12.5 mg total) by mouth 2 (two) times daily. 08/18/20   Sueanne Margarita, MD  clopidogrel (PLAVIX) 75 MG tablet Take 1 tablet (75 mg total) by  mouth daily. 08/18/20   Sueanne Margarita, MD  clotrimazole-betamethasone (LOTRISONE) cream Apply 1 application topically daily as needed (skin folds (under breasts)).  01/13/19   [provider]  escitalopram (LEXAPRO) 10 MG tablet Take 10 mg by mouth at bedtime.     [provider]  ezetimibe (ZETIA) 10 MG tablet Take 1 tablet (10 mg total) by mouth at bedtime. 08/18/20   Sueanne Margarita, MD  furosemide (LASIX) 20 MG tablet 1 tablet    [provider]  isosorbide mononitrate (IMDUR) 120 MG 24 hr tablet Take 1 tablet (120 mg total) by mouth daily. 08/18/20   Sueanne Margarita, MD  levETIRAcetam (KEPPRA) 500 MG tablet Take 1 tablet (500 mg total) by mouth 2 (two) times daily. 10/06/20   Suzzanne Cloud, NP  lisinopril (ZESTRIL) 20 MG tablet Take 1 tablet (20 mg total) by mouth daily. 08/18/20   Sueanne Margarita, MD  metFORMIN (GLUCOPHAGE-XR) 500 MG 24 hr tablet Take 500 mg by mouth daily with breakfast.  06/29/18   [provider]  Nicotine 21-14-7 MG/24HR KIT Use as directed 02/19/20   Imogene Burn, PA-C  nitroGLYCERIN (NITROSTAT) 0.4 MG SL tablet Place 1 tablet (0.4 mg total) under the tongue every 5 (five) minutes x 3 doses as needed for chest pain. 06/11/16   Isaiah Serge, NP  pantoprazole (PROTONIX) 40 MG tablet TAKE 1 TABLET BY MOUTH EVERY DAY 01/09/19   Sueanne Margarita, MD  Potassium Citrate 15 MEQ (1620 MG) TBCR Take 1 tablet by mouth in the morning and at bedtime.    [provider]  topiramate (TOPAMAX) 25 MG tablet Take 3 tablets (75 mg total) by mouth 2 (two) times daily. 10/06/20   Suzzanne Cloud, NP    Allergies    Depakote [divalproex sodium], Other, and Penicillins  Review of Systems   Review of Systems  Gastrointestinal: Positive for nausea and vomiting.  Neurological: Positive for dizziness and weakness.  All other systems reviewed and are negative.   Physical Exam Updated Vital Signs BP (!) 175/119 (BP Location: Left Arm)   Pulse  (!) 59   Temp 98.3 F (36.8 C)   Resp 19   SpO2 100%   Physical Exam Vitals and nursing note reviewed.  Constitutional:      Appearance: Normal appearance.  HENT:     Head: Normocephalic and atraumatic.     Right Ear: External ear normal.     Left Ear: External ear normal.     Nose: Nose normal.     Mouth/Throat:     Mouth: Mucous membranes are moist.     Pharynx: Oropharynx is clear.  Eyes:     Extraocular Movements: Extraocular movements intact.     Conjunctiva/sclera: Conjunctivae normal.     Pupils: Pupils are equal, round, and reactive to light.  Cardiovascular:     Rate and Rhythm: Normal rate and regular rhythm.     Pulses: Normal pulses.     Heart sounds: Normal heart sounds.  Pulmonary:     Effort: Pulmonary effort is normal.     Breath sounds: Normal breath sounds.  Abdominal:     General: Abdomen is flat. Bowel sounds are normal.     Palpations: Abdomen is soft.  Musculoskeletal:        General: Normal range of motion.     Cervical back: Normal range of motion and neck supple.  Skin:    General: Skin is warm.     Capillary Refill: Capillary refill takes less than 2 seconds.  Neurological:     Mental Status: She is alert and oriented to person, place, and time.     Comments: Dysmetria of right arm noted  Psychiatric:        Mood and Affect: Mood normal.        Behavior: Behavior normal.        Thought Content: Thought content normal.        Judgment: Judgment normal.     ED  Results / Procedures / Treatments   Labs (all labs ordered are listed, but only abnormal results are displayed) Labs Reviewed  CBC - Abnormal; Notable for the following components:      Result Value   RBC 5.17 (*)    Hemoglobin 16.5 (*)    HCT 49.3 (*)    All other components within normal limits  COMPREHENSIVE METABOLIC PANEL - Abnormal; Notable for the following components:   Glucose, Bld 164 (*)    Alkaline Phosphatase 127 (*)    All other components within normal limits   I-STAT CHEM 8, ED - Abnormal; Notable for the following components:   Glucose, Bld 164 (*)    Hemoglobin 16.7 (*)    HCT 49.0 (*)    All other components within normal limits  CBG MONITORING, ED - Abnormal; Notable for the following components:   Glucose-Capillary 129 (*)    All other components within normal limits  PROTIME-INR  APTT  DIFFERENTIAL    EKG None  Radiology CT HEAD WO CONTRAST  Result Date: 01/20/2021 CLINICAL DATA:  Unsteady gait and leg weakness. EXAM: CT HEAD WITHOUT CONTRAST TECHNIQUE: Contiguous axial images were obtained from the base of the skull through the vertex without intravenous contrast. COMPARISON:  None. FINDINGS: Brain: There is a 4 cm infarct in the superior aspect of the right cerebellar hemisphere consistent with an acute to subacute appearance. Cytotoxic edema results in mild local mass effect. There is also likely a separate subcentimeter subacute infarct more posteriorly in the right cerebellar hemisphere. No intracranial hemorrhage, midline shift, or extra-axial fluid collection is identified. The ventricles are normal in size. Vascular: Calcified atherosclerosis at the skull base. Skull: No fracture or suspicious osseous lesion. Sinuses/Orbits: Minimal bilateral ethmoid air cell mucosal thickening. Clear mastoid air cells. Bilateral cataract extraction. Other: None. 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 Electronically Signed   By: Logan Bores M.D.   On: 01/20/2021 13:43    Procedures Procedures   Medications Ordered in ED Medications  sodium chloride flush (NS) 0.9 % injection 3 mL (has no administration in time range)  ondansetron (ZOFRAN) injection 4 mg (has no administration in time range)  meclizine (ANTIVERT) tablet 25 mg (has no administration in time range)  aspirin EC tablet 325 mg (has no administration in time range)    ED Course  I have reviewed the triage vital signs and the nursing notes.  Pertinent labs &  imaging results that were available during my care of the patient were reviewed by me and considered in my medical decision making (see chart for details).    MDM Rules/Calculators/A&P                          CT scan + for an acute stroke.  She was d/w Dr. Bayard Beaver (neurology) who will see her in consult.  She recommends a MRI brain and MRA head/neck.  BP is elevated.  I will allow permissive hypertension for now.  Pt d/w Dr. Posey Pronto ([facility]) for admission.  CRITICAL CARE Performed by: Isla Pence   Total critical care time: 30 minutes  Critical care time was exclusive of separately billable procedures and treating other patients.  Critical care was necessary to treat or prevent imminent or life-threatening deterioration.  Critical care was time spent personally by me on the following activities: development of treatment plan with patient and/or surrogate as well as nursing, discussions with consultants, evaluation of patient's response to treatment,  examination of patient, obtaining history from patient or surrogate, ordering and performing treatments and interventions, ordering and review of laboratory studies, ordering and review of radiographic studies, pulse oximetry and re-evaluation of patient's condition.   Final Clinical Impression(s) / ED Diagnoses Final diagnoses:  Cerebrovascular accident (CVA), unspecified mechanism ([place])  Noncompliance with medications    Rx / DC Orders ED Discharge Orders    None       Isla Pence, MD 01/20/21 1551

## 2021-01-21 ENCOUNTER — Inpatient Hospital Stay (HOSPITAL_COMMUNITY): Payer: Medicare HMO

## 2021-01-21 ENCOUNTER — Other Ambulatory Visit: Payer: Self-pay

## 2021-01-21 DIAGNOSIS — I6389 Other cerebral infarction: Secondary | ICD-10-CM

## 2021-01-21 DIAGNOSIS — R569 Unspecified convulsions: Secondary | ICD-10-CM

## 2021-01-21 LAB — COMPREHENSIVE METABOLIC PANEL
ALT: 19 U/L (ref 0–44)
AST: 22 U/L (ref 15–41)
Albumin: 4 g/dL (ref 3.5–5.0)
Alkaline Phosphatase: 114 U/L (ref 38–126)
Anion gap: 7 (ref 5–15)
BUN: 14 mg/dL (ref 8–23)
CO2: 23 mmol/L (ref 22–32)
Calcium: 9.6 mg/dL (ref 8.9–10.3)
Chloride: 108 mmol/L (ref 98–111)
Creatinine, Ser: 0.98 mg/dL (ref 0.44–1.00)
GFR, Estimated: >60 mL/min (ref >60)
Glucose, Bld: 127 mg/dL — ABNORMAL HIGH (ref 70–99)
Potassium: 3.9 mmol/L (ref 3.5–5.1)
Sodium: 138 mmol/L (ref 135–145)
Total Bilirubin: 0.9 mg/dL (ref 0.3–1.2)
Total Protein: 7.1 g/dL (ref 6.5–8.1)

## 2021-01-21 LAB — LIPID PANEL
Cholesterol: 123 mg/dL (ref 0–200)
HDL: 42 mg/dL (ref >40)
LDL Cholesterol: 57 mg/dL (ref 0–99)
Total CHOL/HDL Ratio: 2.9 RATIO
Triglycerides: 118 mg/dL (ref <150)
VLDL: 24 mg/dL (ref 0–40)

## 2021-01-21 LAB — CBG MONITORING, ED
Glucose-Capillary: 138 mg/dL — ABNORMAL HIGH (ref 70–99)
Glucose-Capillary: 144 mg/dL — ABNORMAL HIGH (ref 70–99)

## 2021-01-21 LAB — GLUCOSE, CAPILLARY
Glucose-Capillary: 147 mg/dL — ABNORMAL HIGH (ref 70–99)
Glucose-Capillary: 115 mg/dL — ABNORMAL HIGH (ref 70–99)

## 2021-01-21 LAB — ECHOCARDIOGRAM COMPLETE
Area-P 1/2: 2.32 cm2
S' Lateral: 3 cm

## 2021-01-21 MED ORDER — ASPIRIN EC 325 MG PO TBEC
325.0000 mg | DELAYED_RELEASE_TABLET | Freq: Every day | ORAL | Status: DC
  Administered 2021-01-21 – 2021-01-30 (×10): 325 mg via ORAL
  Filled 2021-01-21 (×10): qty 1

## 2021-01-21 NOTE — Progress Notes (Signed)
Inpatient Rehab Admissions Coordinator Note:   Per therapy recommendations, pt was screened for CIR candidacy by Shann Medal, PT, DPT.  At this time we are recommending a CIR consulta nd I will place an order per our protocol.  Please contact me with questions.   Shann Medal, PT, DPT [phone] 01/21/21 5:38 PM

## 2021-01-21 NOTE — Evaluation (Signed)
Occupational Therapy Evaluation Patient Details Name: Samantha Huffman MRN: [id number] DOB: Nov 02, 1955 Today's Date: 01/21/2021    History of Present Illness Pt is a 65 y.o. F who presents 5/31 with dizziness and weakness. 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 Significant PMH: HTN, CABG, epilepsy, nicotine use.   Clinical Impression   Pt PTA:  Pt limited by decreased coordination/ataxic movements on R side, R LE buckling, decreased ability to care for self and decreased mobility with RW. Pt often staring into space and requiring cues to attend to current task. Pt with delatyed processing and other cognitive impairments. Pt's daughters in room confirming that at baseline, pt is mentally sharp at home. Pt set-upA to modA for ADL and modA for mobility with RW exhibiting ataxic gait. Pt's HR increased to 175 for 2 secs then returned to 105 BPM.   Pt would benefit from continued OT skilled services. OT following acutely.    Follow Up Recommendations  CIR    Equipment Recommendations  3 in 1 bedside commode    Recommendations for Other Services Rehab consult     Precautions / Restrictions Precautions Precautions: Fall Precaution Comments: ataxia, RLE Restrictions Weight Bearing Restrictions: No      Mobility Bed Mobility Overal bed mobility: Needs Assistance Bed Mobility: Sit to Supine     Supine to sit: Min guard Sit to supine: Min guard;HOB elevated   General bed mobility comments: assist for BLE    Transfers Overall transfer level: Needs assistance Equipment used: Rolling walker (2 wheeled);None Transfers: Sit to/from Stand Sit to Stand: Min assist         General transfer comment: MinA to rise from EOB and from [facility]    Balance Overall balance assessment: Needs assistance Sitting-balance support: Feet supported Sitting balance-Leahy Scale: Good     Standing balance  support: Bilateral upper extremity supported Standing balance-Leahy Scale: Poor Standing balance comment: reliant on UE support                           ADL either performed or assessed with clinical judgement   ADL Overall ADL's : Needs assistance/impaired Eating/Feeding: Set up;Sitting;Bed level Eating/Feeding Details (indicate cue type and reason): supported in bed Grooming: Min guard;Standing Grooming Details (indicate cue type and reason): standing x3 mins for task Upper Body Bathing: Min guard;Sitting;Cueing for safety;Cueing for sequencing Upper Body Bathing Details (indicate cue type and reason): Pt with cues to hold on with single UE for support Lower Body Bathing: Moderate assistance;Cueing for safety;Cueing for sequencing;Sitting/lateral leans;Sit to/from stand Lower Body Bathing Details (indicate cue type and reason): assist for safety Upper Body Dressing : Minimal assistance;Sitting   Lower Body Dressing: Moderate assistance;Sitting/lateral leans;Sit to/from stand;Cueing for safety   Toilet Transfer: Moderate assistance;Stand-pivot;BSC;RW Toilet Transfer Details (indicate cue type and reason): cues for each step Toileting- Clothing Manipulation and Hygiene: Minimal assistance;Cueing for safety;Sit to/from stand Toileting - Clothing Manipulation Details (indicate cue type and reason): reaching anterior pericare for a few seconds.     Functional mobility during ADLs: Moderate assistance;Rolling walker;Cueing for safety;Cueing for sequencing General ADL Comments: Pt limited by decreased coordination/ataxic movements on R side, R LE buckling, decreased ability to care for self and decreased mobility with RW. Pt often staring into space and requiring cues to attend to current task. Pt with delatyed processing and other cognitive impairments. Pt's daughters in room confirming that at baseline,  pt is sharp as a tack at home.     Vision Baseline Vision/History: Wears  glasses Patient Visual Report: Other (comment) (R eye abduction at times; [facility] for visual screen) Vision Assessment?: Yes;Vision impaired- to be further tested in functional context Eye Alignment: Impaired (comment) (R eye slightly abducted) Ocular Range of Motion: Within Functional Limits Alignment/Gaze Preference: Within Defined Limits Tracking/Visual Pursuits: Able to track stimulus in all quads without difficulty     Perception     Praxis      Pertinent Vitals/Pain Pain Assessment: No/denies pain     Hand Dominance Right   Extremity/Trunk Assessment Upper Extremity Assessment Upper Extremity Assessment: Generalized weakness;RUE deficits/detail;LUE deficits/detail RUE Deficits / Details: strength 3+/5; ataxia wtih movements; compensating with large shoulder movements attempting to to control [facility] RUE Coordination: decreased fine motor;decreased gross motor LUE Deficits / Details: strength 4/5   Lower Extremity Assessment Lower Extremity Assessment: Defer to PT evaluation;RLE deficits/detail RLE Deficits / Details: ataxia; knee buckling RLE Coordination: decreased gross motor LLE Deficits / Details: Strength 5/5   Cervical / Trunk Assessment Cervical / Trunk Assessment: Normal   Communication Communication Communication: No difficulties   Cognition Arousal/Alertness: Awake/alert Behavior During Therapy: Flat affect Overall Cognitive Status: Impaired/Different from baseline Area of Impairment: Awareness;Problem solving;Safety/judgement                         Safety/Judgement: Decreased awareness of safety;Decreased awareness of deficits Awareness: Emergent Problem Solving: Slow processing;Requires verbal cues;Requires tactile cues General Comments: A&Ox4, increased time to respond- at times pt would close eyes with decreased arousal and would easily awaken. Unsure if pt was very fatigued after PT session just before this session.   General Comments  HR  increased to 175 for 2 secs when standing at sink for grooming and then returned to 105 BPM for session and pt seated for rest of tasks. Pt's 2 daughters in room.    Exercises     Shoulder Instructions      Home Living Family/patient expects to be discharged to:: Private residence Living Arrangements: Alone Available Help at Discharge: Family;Available PRN/intermittently Type of Home: Apartment Home Access: Level entry     Home Layout: One level     Bathroom Shower/Tub: Tub/shower unit         Home Equipment: None          Prior Functioning/Environment Level of Independence: Independent                 OT Problem List: Decreased strength;Decreased activity tolerance;Impaired balance (sitting and/or standing);Impaired vision/perception;Decreased coordination;Decreased cognition;Decreased safety awareness;Pain;Impaired UE functional use      OT Treatment/Interventions: Self-care/ADL training;Therapeutic exercise;Neuromuscular education;Energy conservation;DME and/or AE instruction;Therapeutic activities;Cognitive remediation/compensation;Visual/perceptual remediation/compensation;Patient/family education;Balance training    OT Goals(Current goals can be found in the care plan section) Acute Rehab OT Goals Patient Stated Goal: return to baseline OT Goal Formulation: With patient Time For Goal Achievement: 02/04/21 Potential to Achieve Goals: Good ADL Goals Pt Will Perform Grooming: with supervision;standing Pt Will Perform Lower Body Dressing: with min assist;sitting/lateral leans;sit to/from stand Pt Will Transfer to Toilet: with min guard assist;ambulating;bedside commode Pt/caregiver will Perform Home Exercise Program: With written HEP provided;Right Upper extremity Additional ADL Goal #1: Pt will increase to perform x5 mins of OOB ADL tasks with minA overall with minimal cues for safety. Additional ADL Goal #2: Pt will perform higher level cognitive tasks in a  non-distracting environment with minimal cues to stay aroused in 3/5 trials.  OT Frequency: Min 2X/week   Barriers to D/C:            Co-evaluation              AM-PAC OT "6 Clicks" Daily Activity     Outcome Measure Help from another person eating meals?: A Little Help from another person taking care of personal grooming?: A Little Help from another person toileting, which includes using toliet, bedpan, or urinal?: A Lot Help from another person bathing (including washing, rinsing, drying)?: A Lot Help from another person to put on and taking off regular upper body clothing?: A Little Help from another person to put on and taking off regular lower body clothing?: A Lot 6 Click Score: 15   End of Session Equipment Utilized During Treatment: Gait belt;Rolling walker Nurse Communication: Mobility status  Activity Tolerance: Patient tolerated treatment well Patient left: in bed;with call bell/phone within reach;with bed alarm set;with family/visitor present  OT Visit Diagnosis: Unsteadiness on feet (R26.81);Muscle weakness (generalized) (M62.81);Other symptoms and signs involving cognitive function                Time: 9774-1423 OT Time Calculation (min): 28 min Charges:  OT General Charges $OT Visit: 1 Visit OT Evaluation $OT Eval Moderate Complexity: 1 Mod OT Treatments $Neuromuscular Re-education: 8-22 mins  Jefferey Pica, OTR/L Acute Rehabilitation Services Pager: [phone] Office: [phone]   Linley Moskal C 01/21/2021, 5:30 PM

## 2021-01-21 NOTE — Progress Notes (Signed)
PT Cancellation Note  Patient Details Name: Jaslyn Bansal MRN: [id number] DOB: 02-18-1956   Cancelled Treatment:    Reason Eval/Treat Not Completed: Active bedrest order Pt currently with bedrest orders. Will follow up as schedule allows.   Lou Miner, DPT  Acute Rehabilitation Services  Pager: [phone] Office: [phone]    Rudean Hitt 01/21/2021, 9:51 AM

## 2021-01-21 NOTE — Evaluation (Signed)
Physical Therapy Evaluation Patient Details Name: Samantha Huffman MRN: [id number] DOB: [birth year] Today's Date: 01/21/2021   History of Present Illness  Pt is a 65 y.o. F who presents 5/31 with dizziness and weakness. 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 Significant PMH: HTN, CABG, epilepsy, nicotine use.  Clinical Impression  PTA, pt lives alone and is independent. Pt presents with decreased functional mobility secondary to decreased R sided coordination, ataxic gait, balance deficits. Pt requiring moderate assist for pre gait with no assistive device; progressing to min assist with use of walker for limited room distances. Pt utilizing step to pattern with erratic right step length and width. Pt reporting lightheadedness; BP supine 135/68, sitting 159/101. Suspect good progress given age, PLOF, motivation and family support. Recommend CIR to address deficits and maximize functional independence.     Follow Up Recommendations CIR    Equipment Recommendations  Rolling walker with 5" wheels;3in1 (PT)    Recommendations for Other Services       Precautions / Restrictions Precautions Precautions: Fall Restrictions Weight Bearing Restrictions: No      Mobility  Bed Mobility Overal bed mobility: Needs Assistance Bed Mobility: Supine to Sit     Supine to sit: Min guard     General bed mobility comments: Increased time/effort, min guard for safety    Transfers Overall transfer level: Needs assistance Equipment used: Rolling walker (2 wheeled);None Transfers: Sit to/from Stand Sit to Stand: Min assist         General transfer comment: MinA to rise from edge of bed x 2  Ambulation/Gait Ambulation/Gait assistance: Min assist;Mod assist Gait Distance (Feet): 30 Feet Assistive device: Rolling walker (2 wheeled);None Gait Pattern/deviations: Ataxic;Decreased stride length;Narrow  base of support;Step-to pattern Gait velocity: decreased Gait velocity interpretation: <1.31 ft/sec, indicative of household ambulator General Gait Details: Initially trialed no AD and pt performed pre gait i.e. weight shifts with modA and posterior lean, progressing to minA with use of walker, step to pattern with erratic right step length. Cues provided for sequencing and walker use.  Stairs            Wheelchair Mobility    Modified Rankin (Stroke Patients Only) Modified Rankin (Stroke Patients Only) Pre-Morbid Rankin Score: No symptoms Modified Rankin: Moderately severe disability     Balance Overall balance assessment: Needs assistance Sitting-balance support: Feet supported Sitting balance-Leahy Scale: Good     Standing balance support: Bilateral upper extremity supported Standing balance-Leahy Scale: Poor Standing balance comment: reliant on UE support                             Pertinent Vitals/Pain Pain Assessment: No/denies pain    Home Living Family/patient expects to be discharged to:: Private residence Living Arrangements: Alone Available Help at Discharge: Family;Available PRN/intermittently Type of Home: Apartment Home Access: Level entry     Home Layout: One level Home Equipment: None      Prior Function Level of Independence: Independent               Hand Dominance   Dominant Hand: Right    Extremity/Trunk Assessment   Upper Extremity Assessment Upper Extremity Assessment: Defer to OT evaluation    Lower Extremity Assessment Lower Extremity Assessment: RLE deficits/detail;LLE deficits/detail RLE Deficits / Details: Strength 5/5 RLE Coordination: decreased gross motor LLE Deficits / Details: Strength 5/5    Cervical / Trunk  Assessment Cervical / Trunk Assessment: Normal  Communication   Communication: No difficulties  Cognition Arousal/Alertness: Awake/alert Behavior During Therapy: Flat affect Overall  Cognitive Status: Impaired/Different from baseline Area of Impairment: Awareness;Problem solving                           Awareness: Emergent Problem Solving: Slow processing General Comments: A&Ox4, increased time to respond      General Comments      Exercises     Assessment/Plan    PT Assessment Patient needs continued PT services  PT Problem List Decreased strength;Decreased activity tolerance;Decreased balance;Decreased mobility;Decreased coordination       PT Treatment Interventions DME instruction;Gait training;Therapeutic activities;Stair training;Functional mobility training;Therapeutic exercise;Balance training;Patient/family education    PT Goals (Current goals can be found in the Care Plan section)  Acute Rehab PT Goals Patient Stated Goal: return to baseline PT Goal Formulation: With patient Time For Goal Achievement: 02/04/21 Potential to Achieve Goals: Good    Frequency Min 4X/week   Barriers to discharge        Co-evaluation               AM-PAC PT "6 Clicks" Mobility  Outcome Measure Help needed turning from your back to your side while in a flat bed without using bedrails?: A Little Help needed moving from lying on your back to sitting on the side of a flat bed without using bedrails?: A Little Help needed moving to and from a bed to a chair (including a wheelchair)?: A Little Help needed standing up from a chair using your arms (e.g., wheelchair or bedside chair)?: A Little Help needed to walk in hospital room?: A Lot Help needed climbing 3-5 steps with a railing? : A Lot 6 Click Score: 16    End of Session Equipment Utilized During Treatment: Gait belt Activity Tolerance: Patient tolerated treatment well Patient left: in bed;with call bell/phone within reach;with family/visitor present Nurse Communication: Mobility status PT Visit Diagnosis: Unsteadiness on feet (R26.81);Ataxic gait (R26.0);Difficulty in walking, not  elsewhere classified (R26.2)    Time: 1610-9604 PT Time Calculation (min) (ACUTE ONLY): 30 min   Charges:   PT Evaluation $PT Eval Moderate Complexity: 1 Mod PT Treatments $Gait Training: 8-22 mins        Wyona Almas, PT, DPT Acute Rehabilitation Services Pager [phone] Office [phone]   Deno Etienne 01/21/2021, 4:52 PM

## 2021-01-21 NOTE — Progress Notes (Addendum)
STROKE TEAM PROGRESS NOTE   INTERVAL HISTORY No acute events. VSS. Neuro exam stable. Patient denies any new concerns or symptoms. She is frustrated with right sided upper and lower extremity sensory changes and lack of coordination. She has not tried to walk yet. She reports living alone. She does have a support system of people who can help out if needed.  No visitors at bedside.  We discussed her ongoing work up and plan of care. She had no questions at this point.   Vitals:   01/21/21 0600 01/21/21 0630 01/21/21 0700 01/21/21 0730  BP: (!) 203/90 (!) 172/75 (!) 173/80 (!) 169/70  Pulse: 63 67 63 68  Resp: 15 17 (!) 21 18  Temp:    98.1 F (36.7 C)  TempSrc:    Oral  SpO2: 97% 97% 98% 96%   CBC:  Recent Labs  Lab 01/20/21 1240 01/20/21 1248  WBC 8.1  --   NEUTROABS 4.7  --   HGB 16.5* 16.7*  HCT 49.3* 49.0*  MCV 95.4  --   PLT 220  --    Basic Metabolic Panel:  Recent Labs  Lab 01/20/21 1240 01/20/21 1248 01/21/21 0508  NA 137 140 138  K 4.2 4.3 3.9  CL 105 106 108  CO2 23  --  23  GLUCOSE 164* 164* 127*  BUN 14 16 14   CREATININE 0.98 0.90 0.98  CALCIUM 10.1  --  9.6   Lipid Panel:  Recent Labs  Lab 01/21/21 0508  CHOL 123  TRIG 118  HDL 42  CHOLHDL 2.9  VLDL 24  LDLCALC 57   HgbA1c: No results for input(s): HGBA1C in the last 168 hours. Urine Drug Screen: No results for input(s): LABOPIA, COCAINSCRNUR, LABBENZ, AMPHETMU, THCU, LABBARB in the last 168 hours.  Alcohol Level No results for input(s): ETH in the last 168 hours.  IMAGING past 24 hours CT HEAD WO CONTRAST  Result Date: 01/20/2021 CLINICAL DATA:  Unsteady gait and leg weakness. EXAM: CT HEAD WITHOUT CONTRAST TECHNIQUE: Contiguous axial images were obtained from the base of the skull through the vertex without intravenous contrast. COMPARISON:  None. FINDINGS: Brain: There is a 4 cm infarct in the superior aspect of the right cerebellar hemisphere consistent with an acute to subacute  appearance. Cytotoxic edema results in mild local mass effect. There is also likely a separate subcentimeter subacute infarct more posteriorly in the right cerebellar hemisphere. No intracranial hemorrhage, midline shift, or extra-axial fluid collection is identified. The ventricles are normal in size. Vascular: Calcified atherosclerosis at the skull base. Skull: No fracture or suspicious osseous lesion. Sinuses/Orbits: Minimal bilateral ethmoid air cell mucosal thickening. Clear mastoid air cells. Bilateral cataract extraction. Other: None. 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 Electronically Signed   By: Logan Bores M.D.   On: 01/20/2021 13:43   MR ANGIO HEAD WO CONTRAST  Result Date: 01/20/2021 CLINICAL DATA:  Stroke EXAM: MRA HEAD WITHOUT CONTRAST TECHNIQUE: Angiographic images of the Circle of Willis were acquired using MRA technique without intravenous contrast. COMPARISON:  MRI head 01/20/2021 FINDINGS: Anterior circulation: Internal carotid artery widely patent bilaterally. Anterior and middle cerebral arteries patent bilaterally without stenosis or large vessel occlusion. Posterior circulation: Both vertebral arteries patent to the basilar without stenosis. Right PICA not visualized. Very small left PICA Ca. Left AICA patent. Right AICA not visualized. Right superior cerebral artery occluded proximally. Left superior cerebellar artery patent. Posterior cerebral arteries patent bilaterally. Anatomic variants: None Other: Negative for cerebral aneurysm. IMPRESSION:  Both vertebral arteries patent to the basilar. Right PICA not visualized. Small left PICA. Right superior cerebral artery occluded proximally compatible with acute infarct. Electronically Signed   By: Franchot Gallo M.D.   On: 01/20/2021 20:10   MR Angiogram Neck W or Wo Contrast  Result Date: 01/20/2021 CLINICAL DATA:  Stroke EXAM: MRA NECK WITHOUT AND WITH CONTRAST TECHNIQUE: Multiplanar and multiecho pulse sequences of  the neck were obtained without and with intravenous contrast. Angiographic images of the neck were obtained using MRA technique without and with intravenous contrast. CONTRAST:  2mL GADAVIST GADOBUTROL 1 MMOL/ML IV SOLN COMPARISON:  MRI head 01/20/2021 FINDINGS: Antegrade flow in the carotid and vertebral arteries bilaterally Mild atherosclerotic disease in the carotid bulb bilaterally without flow limiting stenosis. Remainder of the internal carotid artery is widely patent through the cavernous segment. Left vertebral artery is widely patent to the basilar without stenosis Mild diffuse narrowing of the proximal right vertebral artery. Remainder of the right vertebral artery is patent to the basilar. IMPRESSION: Mild atherosclerotic disease in the carotid bowel bulb bilaterally without significant stenosis Mild narrowing proximal right vertebral artery. This may be due to atherosclerotic disease or dissection. Given the posterior fossa infarcts, consider CT angio head and neck for further evaluation. Electronically Signed   By: Franchot Gallo M.D.   On: 01/20/2021 20:07   MR BRAIN WO CONTRAST  Result Date: 01/20/2021 CLINICAL DATA:  Acute neuro deficit.  Stroke. EXAM: MRI HEAD WITHOUT CONTRAST TECHNIQUE: Multiplanar, multiecho pulse sequences of the brain and surrounding structures were obtained without intravenous contrast. COMPARISON:  CT head 01/20/2021 FINDINGS: Brain: 3 cm acute infarct in the right superior cerebellar artery territory. Additional small area of acute infarct in the right PICA territory. Small acute infarct in the left thalamus. Ventricle size is normal. Negative for hemorrhage or mass. Few small white matter hyperintensities bilaterally likely due to chronic microvascular ischemia. Vascular: Normal arterial flow voids. Skull and upper cervical spine: Negative Sinuses/Orbits: Paranasal sinuses clear. Bilateral cataract extraction Other: None IMPRESSION: 3 cm acute infarct right superior  cerebellum. Smaller acute infarct right PICA territory. Small acute infarct left thalamus. Electronically Signed   By: Franchot Gallo M.D.   On: 01/20/2021 20:02   CT ANGIO HEAD CODE STROKE  Result Date: 01/20/2021 CLINICAL DATA:  Focal neuro deficit, > 6 hrs, stroke suspected EXAM: CT ANGIOGRAPHY HEAD AND NECK TECHNIQUE: Multidetector CT imaging of the head and neck was performed using the standard protocol during bolus administration of intravenous contrast. Multiplanar CT image reconstructions and MIPs were obtained to evaluate the vascular anatomy. Carotid stenosis measurements (when applicable) are obtained utilizing NASCET criteria, using the distal internal carotid diameter as the denominator. CONTRAST:  46mL OMNIPAQUE IOHEXOL 350 MG/ML SOLN COMPARISON:  None. FINDINGS: CTA NECK FINDINGS SKELETON: There is no bony spinal canal stenosis. No lytic or blastic lesion. OTHER NECK: Normal pharynx, larynx and major salivary glands. No cervical lymphadenopathy. Unremarkable thyroid gland. UPPER CHEST: No pneumothorax or pleural effusion. No nodules or masses. AORTIC ARCH: There is calcific atherosclerosis of the aortic arch. There is no aneurysm, dissection or hemodynamically significant stenosis of the visualized portion of the aorta. Conventional 3 vessel aortic branching pattern. The visualized proximal subclavian arteries are widely patent. RIGHT CAROTID SYSTEM: Normal without aneurysm, dissection or stenosis. LEFT CAROTID SYSTEM: Normal without aneurysm, dissection or stenosis. VERTEBRAL ARTERIES: Codominant configuration. Both origins are clearly patent. There is no dissection, occlusion or flow-limiting stenosis to the skull base (V1-V3 segments). CTA HEAD FINDINGS POSTERIOR  CIRCULATION: --Vertebral arteries: Normal V4 segments. --Inferior cerebellar arteries: Normal. --Basilar artery: Normal. 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  Normal left. --Posterior cerebral arteries (PCA): Normal.  ANTERIOR CIRCULATION: --Intracranial internal carotid arteries: Normal. --Anterior cerebral arteries (ACA): Normal. Both A1 segments are present. Patent anterior communicating artery (a-comm). --Middle cerebral arteries (MCA): Normal. VENOUS SINUSES: As permitted by contrast timing, patent. ANATOMIC VARIANTS: None Review of the MIP images confirms the above findings. IMPRESSION: 1. No emergent large vessel occlusion. 2. Proximal occlusion of the right superior cerebellar artery. Aortic Atherosclerosis (ICD10-I70.0). Electronically Signed   By: Ulyses Jarred M.D.   On: 01/20/2021 21:54   CT ANGIO NECK CODE STROKE  Result Date: 01/20/2021 CLINICAL DATA:  Focal neuro deficit, > 6 hrs, stroke suspected EXAM: CT ANGIOGRAPHY HEAD AND NECK TECHNIQUE: Multidetector CT imaging of the head and neck was performed using the standard protocol during bolus administration of intravenous contrast. Multiplanar CT image reconstructions and MIPs were obtained to evaluate the vascular anatomy. Carotid stenosis measurements (when applicable) are obtained utilizing NASCET criteria, using the distal internal carotid diameter as the denominator. CONTRAST:  53mL OMNIPAQUE IOHEXOL 350 MG/ML SOLN COMPARISON:  None. FINDINGS: CTA NECK FINDINGS SKELETON: There is no bony spinal canal stenosis. No lytic or blastic lesion. OTHER NECK: Normal pharynx, larynx and major salivary glands. No cervical lymphadenopathy. Unremarkable thyroid gland. UPPER CHEST: No pneumothorax or pleural effusion. No nodules or masses. AORTIC ARCH: There is calcific atherosclerosis of the aortic arch. There is no aneurysm, dissection or hemodynamically significant stenosis of the visualized portion of the aorta. Conventional 3 vessel aortic branching pattern. The visualized proximal subclavian arteries are widely patent. RIGHT CAROTID SYSTEM: Normal without aneurysm, dissection or stenosis. LEFT CAROTID SYSTEM: Normal without aneurysm, dissection or stenosis.  VERTEBRAL ARTERIES: Codominant configuration. Both origins are clearly patent. There is no dissection, occlusion or flow-limiting stenosis to the skull base (V1-V3 segments). CTA HEAD FINDINGS POSTERIOR CIRCULATION: --Vertebral arteries: Normal V4 segments. --Inferior cerebellar arteries: Normal. --Basilar artery: Normal. 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  Normal left. --Posterior cerebral arteries (PCA): Normal. ANTERIOR CIRCULATION: --Intracranial internal carotid arteries: Normal. --Anterior cerebral arteries (ACA): Normal. Both A1 segments are present. Patent anterior communicating artery (a-comm). --Middle cerebral arteries (MCA): Normal. VENOUS SINUSES: As permitted by contrast timing, patent. ANATOMIC VARIANTS: None Review of the MIP images confirms the above findings. IMPRESSION: 1. No emergent large vessel occlusion. 2. Proximal occlusion of the right superior cerebellar artery. Aortic Atherosclerosis (ICD10-I70.0). Electronically Signed   By: Ulyses Jarred M.D.   On: 01/20/2021 21:54   PHYSICAL EXAM General: Sitting upright on  ER bed feeding herself spaghetti  with right hand.  Constitutional: Appears well-developed and well-nourished.  Psych: Mildly anxious and frustrated regarding stroke deficit Head: Normocephalic.  Cardiovascular: Normal rate and regular rhythm.  Respiratory: Effort normal, non-labored breathing  Skin: Warm and dry  Neuro: Mental Status: Patient is fully awake, alert, oriented to time, place, person  Patient is able to give a clear and coherent history. No signs of aphasia or neglect. Follows 1-2 step commands.   Cranial Nerves: II: Visual Fields are full. Pupils are equal, round, and reactive to light.   III,IV, VI: EOMI without ptosis or diploplia.  V: Facial sensation is symmetric to temperature VII: Facial movement is symmetric.  VIII: hearing is intact to voice X: Uvula elevates symmetrically XI: Shoulder shrug is symmetric. XII:  tongue is midline without atrophy or fasciculations.  Motor: Tone is normal. Bulk is normal. 5/5 strength  was present in all four extremities.  Sensory: Sensation is symmetric to light touch and temperature in the arms and legs. Plantars: Toes are downgoing bilaterally.  Cerebellar: Ataxia in right upper extremity and right lower extremity with finger-to-nose and heel-to-shin  ASSESSMENT/PLAN 59 old female with PMH significant for seizure, CAD, OSA, DM2, Migraines, HLD, HTN presented with sudden onset of dizziness and weakness after lifting a container of water bottles that started on 01/19/2021 at around [place].  Not a candidate for tPA and thrombectomy as patient outside window.   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 Smaller acute infarct right PICA territory. Small acute infarct left thalamus. Etiology unclear. Possible vertebral dissection.    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 Smaller acute infarct right PICA territory. Small acute infarct left thalamus.  MRA : Acute to subacute right cerebellar infarct.  CTA Head and Neck: No emergent large vessel occlusion.       Proximal occlusion of the right superior cerebellar artery  2D Echo: EF 60-65%. Mild LVH.  Grade I diastolic dysfunction. No shunt, thrombus or wall motion abnormalities.   LDL 57  HgbA1c 7.6  VTE prophylaxis - is recommended     Diet   Diet NPO time specified     On ASA 81mg  and Plavix prior to admission  Now on ASA 325mg  and Plavix 75 mg daily   Therapy recommendations:  TBD  Disposition:  Pending  Hypertension  Stable . Permissive hypertension (OK if < 220/120) but gradually normalize in 5-7 days . 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 Pt was seen and examined.   65 year old female with history of CAD status post CABG, epilepsy, hypertension, hyperlipidemia, diabetes, smoker and OSA admitted for right neck pain, right arm leg weakness, and steady gait, vertigo after lifting water bottle case.  CT right cerebellum infarct.  MRI showed left thalamic, right PICA and right SCA infarcts with largest at right SCA.  MRI showed right MCA stenosis, right PICA and SCA occluded.  CTA head and neck also showed right ICA occlusion.  EF 60 to 65%.  LDL 57, creatinine 0.98, A1c pending.  On exam, granddaughter at bedside, patient sleepy but able to keep eyes open with voice, orientated to age, place, time and people. No aphasia, paucity of speech, following all simple commands. Able to name and repeat. No gaze palsy, tracking bilaterally, visual field full, PERRL, no nystagmus or eye disconjugate. No facial droop. Tongue midline. Bilateral UEs 4/5, no drift. Bilaterally LEs 3/5, no drift. Sensation symmetrical bilaterally, b/l FTN intact but  slow with slower on the right, gait not tested.   Etiology for patient posterior circulation stroke likely due to vertebral artery dissection after lifting and straining.  Right SCA large infarct with slight fourth ventricle compression, will need repeat CT in 2 days to  rule out hydrocephalus.  Recommend aspirin 325 and Plavix 75 DAPT for 3 months and then back to aspirin 81 and Plavix 75 DAPT home dose.  Continue Lipitor 80, Zetia 10 home medication.  Continue Keppra and Topamax Home meds for seizure prevention.  Quit smoking.  Will follow.  For detailed assessment and plan, please refer to above as I have made changes wherever appropriate.   Rosalin Hawking, MD PhD Stroke Neurology 01/21/2021 8:02 PM      To contact Stroke Continuity provider, please refer to [email or web address] After hours, contact General Neurology

## 2021-01-21 NOTE — ED Notes (Signed)
Pt daughter is at bedside asking for treatment update. Dr.Powell sent a secured chat to see if he could call daughter. Waiting for response.

## 2021-01-21 NOTE — Progress Notes (Addendum)
PROGRESS NOTE    Samantha Huffman  UUV:[id number] DOB: [birth year] DOA: 01/20/2021 PCP: Alroy Dust, L.Marlou Sa, MD   No chief complaint on file.  Brief Narrative:  Samantha Huffman is Samantha Huffman 65 y.o. female seen in ed with complaints of  Unsteady gait and right arm and right leg weakness since Sunday. Pt is post BL cataract sx and was lifting water case  over weekend and since then this has started. Pt does reports headache and balance issue and no chest pain sob, or bleeding or abdominal or urinary issue. Pt states that Sunday night she realized the case of water was in the truck so she went to get it and when she put th e case down she has sharp pain on rt side of her neck and she returned to bed and then in am woke up and realized her rt arm and leg was not feeling the same and both her daughter were there to bathe her and feed her but she has been feeling tired and so came to er.  Pt has past medical history of HTN, DM II, Seizure,GERD.  Assessment & Plan:   Principal Problem:   Acute cerebrovascular accident (CVA) ([place]) Active Problems:   Diabetes mellitus without complication ([place])   Dyslipidemia   Seizures ([place])   CAD (coronary artery disease)   Essential hypertension   Carotid artery stenosis, asymptomatic   Tobacco abuse   Acute CVA (cerebrovascular accident) ([place])  Acute ischemic CVA  3 cm acute infarct right superior cerebellum  Right PICA Territory Infarct  Small Acute Infarct L [place]:  CTA head/neck without emergent LVO, proximal occlusion of right superior cerebellar artery MRA head/neck with mild atherosclerotic disease in carotid bulb bilateral without significant stenosis, mild narroiwing proximal right vertebral artery (2/2 atherosclerotic disease vs dissection - recommended consider CT angio head/neck).  Vertebral arteries patent to the basilar, R PICA not visualized.  Small L PICA.  R superior cerebral artery occluded proximally compatible with infarct.   MRI brain with 3 cm acute  infarct right superior cerebellum, smaller acute infarct right PICA territory, small acute infarct left thalamus  Aspirin 81 daily, Lipitor 40 LDL 57, HDL 42 Permissive hypertension  Neurology c/s, appreciate recs PT, OT, SLP.  Aspiration precautions. Echo with EF 47-42%, grade I diastolic dysfunction   V9DG Sliding scale insulin Follow A1c Accu-Cheks and hypoglycemia protocol.  Dyslipidemia: Continue lipitor   Seizures: seizure precautions. Jodi Marble continued.   CAD:  Continue patient on aspirin and statin therapy. Blocker therapy held for permissive hypertension for the next 24 to 48 hours.    Hypertension: As needed labetalol for systolic blood pressure of 220 and above. Home regimen of Coreg 12.5, Lasix 20 mg, Imdur 120 mg all held for permissive hypertension.    Carotid artery stenosis:  Imaging notable for mild atherosclerotic dz in carotid bulb bilaterally without significant stenosis   Tobacco abuse: Counseling once patient is stable.  Concern for ability to afford meds, toc c/s  DVT prophylaxis: SCD Code Status: full  Family Communication: none at bedside Disposition:   Status is: Inpatient  Remains inpatient appropriate because:Inpatient level of care appropriate due to severity of illness   Dispo: The patient is from: Home              Anticipated d/c is to: pending              Patient currently is not medically stable to d/c.   Difficult to place patient No  Consultants:   neurology  Procedures: Echo IMPRESSIONS    1. Left ventricular ejection fraction, by estimation, is 60 to 65%. The  left ventricle has normal function. The left ventricle has no regional  wall motion abnormalities. There is mild left ventricular hypertrophy.  Left ventricular diastolic parameters  are consistent with Grade I diastolic dysfunction (impaired relaxation).  2. Right ventricular systolic function is normal. The right ventricular  size is  normal. Tricuspid regurgitation signal is inadequate for assessing  PA pressure.  3. The mitral valve is grossly normal. Trivial mitral valve  regurgitation. No evidence of mitral stenosis.  4. The aortic valve is tricuspid. Aortic valve regurgitation is not  visualized. No aortic stenosis is present.  5. The inferior vena cava is normal in size with greater than 50%  respiratory variability, suggesting right atrial pressure of 3 mmHg.   Conclusion(s)/Recommendation(s): No intracardiac source of embolism  detected on this transthoracic study. Meli Faley transesophageal echocardiogram is  recommended to exclude cardiac source of embolism if clinically indicated.  Antimicrobials:  Anti-infectives (From admission, onward)   None     Subjective: No new complaints  Objective: Vitals:   01/21/21 1148 01/21/21 1200 01/21/21 1245 01/21/21 1406  BP:  (!) 152/93 (!) 173/84 (!) 138/91  Pulse:  72 72 79  Resp:  (!) 21 18 15   Temp: 98.1 F (36.7 C)   98.1 F (36.7 C)  TempSrc: Oral   Oral  SpO2:  97% 97% 99%   No intake or output data in the 24 hours ending 01/21/21 1450 There were no vitals filed for this visit.  Examination:  General exam: Appears calm and comfortable  Respiratory system: Clear to auscultation. Respiratory effort normal. Cardiovascular system: S1 & S2 heard, RRR.  Gastrointestinal system: Abdomen is nondistended, soft and nontender. Central nervous system: Alert and oriented. No focal neurological deficits. Extremities: right facial droop, 4/5 strength on R  Skin: No rashes, lesions or ulcers Psychiatry: Judgement and insight appear normal. Mood & affect appropriate.     Data Reviewed: I have personally reviewed following labs and imaging studies  CBC: Recent Labs  Lab 01/20/21 1240 01/20/21 1248  WBC 8.1  --   NEUTROABS 4.7  --   HGB 16.5* 16.7*  HCT 49.3* 49.0*  MCV 95.4  --   PLT 220  --     Basic Metabolic Panel: Recent Labs  Lab 01/20/21 1240  01/20/21 1248 01/21/21 0508  NA 137 140 138  K 4.2 4.3 3.9  CL 105 106 108  CO2 23  --  23  GLUCOSE 164* 164* 127*  BUN 14 16 14   CREATININE 0.98 0.90 0.98  CALCIUM 10.1  --  9.6    GFR: CrCl cannot be calculated (Unknown ideal weight.).  Liver Function Tests: Recent Labs  Lab 01/20/21 1240 01/21/21 0508  AST 21 22  ALT 17 19  ALKPHOS 127* 114  BILITOT 0.8 0.9  PROT 8.1 7.1  ALBUMIN 4.6 4.0    CBG: Recent Labs  Lab 01/20/21 1549 01/20/21 1951 01/21/21 0745 01/21/21 1146  GLUCAP 129* 140* 138* 144*     Recent Results (from the past 240 hour(s))  Resp Panel by RT-PCR (Flu Mariyah Upshaw&B, Covid) Nasopharyngeal Swab     Status: None   Collection Time: 01/20/21  3:52 PM   Specimen: Nasopharyngeal Swab; Nasopharyngeal(NP) swabs in vial transport medium  Result Value Ref Range Status   SARS Coronavirus 2 by RT PCR NEGATIVE NEGATIVE Final    Comment: (NOTE) SARS-CoV-2  target nucleic acids are NOT DETECTED.  The SARS-CoV-2 RNA is generally detectable in upper respiratory specimens during the acute phase of infection. The lowest concentration of SARS-CoV-2 viral copies this assay can detect is 138 copies/mL. Asuncion Shibata negative result does not preclude SARS-Cov-2 infection and should not be used as the sole basis for treatment or other patient management decisions. Skanda Worlds negative result may occur with  improper specimen collection/handling, submission of specimen other than nasopharyngeal swab, presence of viral mutation(s) within the areas targeted by this assay, and inadequate number of viral copies(<138 copies/mL). Square Jowett negative result must be combined with clinical observations, patient history, and epidemiological information. The expected result is Negative.  Fact Sheet for Patients:  EntrepreneurPulse.com.au  Fact Sheet for Healthcare Providers:  IncredibleEmployment.be  This test is no t yet approved or cleared by the [place] FDA and   has been authorized for detection and/or diagnosis of SARS-CoV-2 by FDA under an Emergency Use Authorization (EUA). This EUA will remain  in effect (meaning this test can be used) for the duration of the COVID-19 declaration under Section 564(b)(1) of the Act, 21 U.S.C.section 360bbb-3(b)(1), unless the authorization is terminated  or revoked sooner.       Influenza Broxton Broady by PCR NEGATIVE NEGATIVE Final   Influenza B by PCR NEGATIVE NEGATIVE Final    Comment: (NOTE) The Xpert Xpress SARS-CoV-2/FLU/RSV plus assay is intended as an aid in the diagnosis of influenza from Nasopharyngeal swab specimens and should not be used as Ammon Muscatello sole basis for treatment. Nasal washings and aspirates are unacceptable for Xpert Xpress SARS-CoV-2/FLU/RSV testing.  Fact Sheet for Patients: EntrepreneurPulse.com.au  Fact Sheet for Healthcare Providers: IncredibleEmployment.be  This test is not yet approved or cleared by the [place] FDA and has been authorized for detection and/or diagnosis of SARS-CoV-2 by FDA under an Emergency Use Authorization (EUA). This EUA will remain in effect (meaning this test can be used) for the duration of the COVID-19 declaration under Section 564(b)(1) of the Act, 21 U.S.C. section 360bbb-3(b)(1), unless the authorization is terminated or revoked.  Performed at [facility], [place] [address] [place], [place] [address]          Radiology Studies: CT HEAD WO CONTRAST  Result Date: 01/20/2021 CLINICAL DATA:  Unsteady gait and leg weakness. EXAM: CT HEAD WITHOUT CONTRAST TECHNIQUE: Contiguous axial images were obtained from the base of the skull through the vertex without intravenous contrast. COMPARISON:  None. FINDINGS: Brain: There is Lisabeth Mian 4 cm infarct in the superior aspect of the right cerebellar hemisphere consistent with an acute to subacute appearance. Cytotoxic edema results in mild local mass effect. There is also likely Samhita Kretsch  separate subcentimeter subacute infarct more posteriorly in the right cerebellar hemisphere. No intracranial hemorrhage, midline shift, or extra-axial fluid collection is identified. The ventricles are normal in size. Vascular: Calcified atherosclerosis at the skull base. Skull: No fracture or suspicious osseous lesion. Sinuses/Orbits: Minimal bilateral ethmoid air cell mucosal thickening. Clear mastoid air cells. Bilateral cataract extraction. Other: None. 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 Electronically Signed   By: Logan Bores M.D.   On: 01/20/2021 13:43   MR ANGIO HEAD WO CONTRAST  Result Date: 01/20/2021 CLINICAL DATA:  Stroke EXAM: MRA HEAD WITHOUT CONTRAST TECHNIQUE: Angiographic images of the Circle of Willis were acquired using MRA technique without intravenous contrast. COMPARISON:  MRI head 01/20/2021 FINDINGS: Anterior circulation: Internal carotid artery widely patent bilaterally. Anterior and middle cerebral arteries patent bilaterally without stenosis or large vessel  occlusion. Posterior circulation: Both vertebral arteries patent to the basilar without stenosis. Right PICA not visualized. Very small left PICA Ca. Left AICA patent. Right AICA not visualized. Right superior cerebral artery occluded proximally. Left superior cerebellar artery patent. Posterior cerebral arteries patent bilaterally. Anatomic variants: None Other: Negative for cerebral aneurysm. IMPRESSION: Both vertebral arteries patent to the basilar. Right PICA not visualized. Small left PICA. Right superior cerebral artery occluded proximally compatible with acute infarct. Electronically Signed   By: Franchot Gallo M.D.   On: 01/20/2021 20:10   MR Angiogram Neck W or Wo Contrast  Result Date: 01/20/2021 CLINICAL DATA:  Stroke EXAM: MRA NECK WITHOUT AND WITH CONTRAST TECHNIQUE: Multiplanar and multiecho pulse sequences of the neck were obtained without and with intravenous contrast. Angiographic images of  the neck were obtained using MRA technique without and with intravenous contrast. CONTRAST:  67mL GADAVIST GADOBUTROL 1 MMOL/ML IV SOLN COMPARISON:  MRI head 01/20/2021 FINDINGS: Antegrade flow in the carotid and vertebral arteries bilaterally Mild atherosclerotic disease in the carotid bulb bilaterally without flow limiting stenosis. Remainder of the internal carotid artery is widely patent through the cavernous segment. Left vertebral artery is widely patent to the basilar without stenosis Mild diffuse narrowing of the proximal right vertebral artery. Remainder of the right vertebral artery is patent to the basilar. IMPRESSION: Mild atherosclerotic disease in the carotid bowel bulb bilaterally without significant stenosis Mild narrowing proximal right vertebral artery. This may be due to atherosclerotic disease or dissection. Given the posterior fossa infarcts, consider CT angio head and neck for further evaluation. Electronically Signed   By: Franchot Gallo M.D.   On: 01/20/2021 20:07   MR BRAIN WO CONTRAST  Result Date: 01/20/2021 CLINICAL DATA:  Acute neuro deficit.  Stroke. EXAM: MRI HEAD WITHOUT CONTRAST TECHNIQUE: Multiplanar, multiecho pulse sequences of the brain and surrounding structures were obtained without intravenous contrast. COMPARISON:  CT head 01/20/2021 FINDINGS: Brain: 3 cm acute infarct in the right superior cerebellar artery territory. Additional small area of acute infarct in the right PICA territory. Small acute infarct in the left thalamus. Ventricle size is normal. Negative for hemorrhage or mass. Few small white matter hyperintensities bilaterally likely due to chronic microvascular ischemia. Vascular: Normal arterial flow voids. Skull and upper cervical spine: Negative Sinuses/Orbits: Paranasal sinuses clear. Bilateral cataract extraction Other: None IMPRESSION: 3 cm acute infarct right superior cerebellum. Smaller acute infarct right PICA territory. Small acute infarct left  thalamus. Electronically Signed   By: Franchot Gallo M.D.   On: 01/20/2021 20:02   ECHOCARDIOGRAM COMPLETE  Result Date: 01/21/2021    ECHOCARDIOGRAM REPORT   Patient Name:   RAYNEISHA BOUZA Date of Exam: 01/21/2021 Medical Rec #:  [id number]    Height:       61.0 in Accession #:    [id number]   Weight:       143.0 lb Date of Birth:  Sep 03, 1955    BSA:          1.638 m Patient Age:    22 years     BP:           156/99 mmHg Patient Gender: F            HR:           66 bpm. Exam Location:  Inpatient Procedure: 2D Echo, Cardiac Doppler and Color Doppler Indications:    Stroke I63.9  History:        Patient has prior history of Echocardiogram examinations, most  recent 08/07/2018. CAD, Carotid Disease; Risk                 Factors:Hypertension, Diabetes, Dyslipidemia, Sleep Apnea and                 GERD. Seizures.  Sonographer:    Jonelle Sidle Dance Referring Phys: [id number] EKTA V PATEL IMPRESSIONS  1. Left ventricular ejection fraction, by estimation, is 60 to 65%. The left ventricle has normal function. The left ventricle has no regional wall motion abnormalities. There is mild left ventricular hypertrophy. Left ventricular diastolic parameters are consistent with Grade I diastolic dysfunction (impaired relaxation).  2. Right ventricular systolic function is normal. The right ventricular size is normal. Tricuspid regurgitation signal is inadequate for assessing PA pressure.  3. The mitral valve is grossly normal. Trivial mitral valve regurgitation. No evidence of mitral stenosis.  4. The aortic valve is tricuspid. Aortic valve regurgitation is not visualized. No aortic stenosis is present.  5. The inferior vena cava is normal in size with greater than 50% respiratory variability, suggesting right atrial pressure of 3 mmHg. Conclusion(s)/Recommendation(s): No intracardiac source of embolism detected on this transthoracic study. Umeka Wrench transesophageal echocardiogram is recommended to exclude cardiac source of embolism  if clinically indicated. FINDINGS  Left Ventricle: Left ventricular ejection fraction, by estimation, is 60 to 65%. The left ventricle has normal function. The left ventricle has no regional wall motion abnormalities. The left ventricular internal cavity size was normal in size. There is  mild left ventricular hypertrophy. Left ventricular diastolic parameters are consistent with Grade I diastolic dysfunction (impaired relaxation). Right Ventricle: The right ventricular size is normal. No increase in right ventricular wall thickness. Right ventricular systolic function is normal. Tricuspid regurgitation signal is inadequate for assessing PA pressure. Left Atrium: Left atrial size was normal in size. Right Atrium: Right atrial size was normal in size. Pericardium: Trivial pericardial effusion is present. Mitral Valve: The mitral valve is grossly normal. Mild mitral annular calcification. Trivial mitral valve regurgitation. No evidence of mitral valve stenosis. Tricuspid Valve: The tricuspid valve is normal in structure. Tricuspid valve regurgitation is not demonstrated. No evidence of tricuspid stenosis. Aortic Valve: The aortic valve is tricuspid. Aortic valve regurgitation is not visualized. No aortic stenosis is present. Pulmonic Valve: The pulmonic valve was normal in structure. Pulmonic valve regurgitation is trivial. No evidence of pulmonic stenosis. Aorta: The aortic root is normal in size and structure. Venous: The inferior vena cava is normal in size with greater than 50% respiratory variability, suggesting right atrial pressure of 3 mmHg. IAS/Shunts: No atrial level shunt detected by color flow Doppler.  LEFT VENTRICLE PLAX 2D LVIDd:         4.00 cm  Diastology LVIDs:         3.00 cm  LV e' medial:    5.77 cm/s LV PW:         1.20 cm  LV E/e' medial:  8.8 LV IVS:        1.10 cm  LV e' lateral:   8.16 cm/s LVOT diam:     1.80 cm  LV E/e' lateral: 6.2 LV SV:         40 LV SV Index:   25 LVOT Area:     2.54  cm  RIGHT VENTRICLE            IVC RV Basal diam:  2.30 cm    IVC diam: 1.10 cm RV S prime:     8.59 cm/s TAPSE (M-mode):  1.4 cm LEFT ATRIUM             Index       RIGHT ATRIUM           Index LA diam:        2.80 cm 1.71 cm/m  RA Area:     10.80 cm LA Vol (A2C):   42.3 ml 25.83 ml/m RA Volume:   21.80 ml  13.31 ml/m LA Vol (A4C):   37.9 ml 23.14 ml/m LA Biplane Vol: 40.7 ml 24.85 ml/m  AORTIC VALVE LVOT Vmax:   71.70 cm/s LVOT Vmean:  48.700 cm/s LVOT VTI:    0.159 m  AORTA Ao Root diam: 3.00 cm Ao Asc diam:  2.70 cm MITRAL VALVE MV Area (PHT): 2.32 cm    SHUNTS MV Decel Time: 327 msec    Systemic VTI:  0.16 m MV E velocity: 50.60 cm/s  Systemic Diam: 1.80 cm MV Tyce Delcid velocity: 69.40 cm/s MV E/Noela Brothers ratio:  0.73 Cherlynn Kaiser MD Electronically signed by Cherlynn Kaiser MD Signature Date/Time: 01/21/2021/10:46:15 AM    Final    CT ANGIO HEAD CODE STROKE  Result Date: 01/20/2021 CLINICAL DATA:  Focal neuro deficit, > 6 hrs, stroke suspected EXAM: CT ANGIOGRAPHY HEAD AND NECK TECHNIQUE: Multidetector CT imaging of the head and neck was performed using the standard protocol during bolus administration of intravenous contrast. Multiplanar CT image reconstructions and MIPs were obtained to evaluate the vascular anatomy. Carotid stenosis measurements (when applicable) are obtained utilizing NASCET criteria, using the distal internal carotid diameter as the denominator. CONTRAST:  30mL OMNIPAQUE IOHEXOL 350 MG/ML SOLN COMPARISON:  None. FINDINGS: CTA NECK FINDINGS SKELETON: There is no bony spinal canal stenosis. No lytic or blastic lesion. OTHER NECK: Normal pharynx, larynx and major salivary glands. No cervical lymphadenopathy. Unremarkable thyroid gland. UPPER CHEST: No pneumothorax or pleural effusion. No nodules or masses. AORTIC ARCH: There is calcific atherosclerosis of the aortic arch. There is no aneurysm, dissection or hemodynamically significant stenosis of the visualized portion of the aorta. Conventional  3 vessel aortic branching pattern. The visualized proximal subclavian arteries are widely patent. RIGHT CAROTID SYSTEM: Normal without aneurysm, dissection or stenosis. LEFT CAROTID SYSTEM: Normal without aneurysm, dissection or stenosis. VERTEBRAL ARTERIES: Codominant configuration. Both origins are clearly patent. There is no dissection, occlusion or flow-limiting stenosis to the skull base (V1-V3 segments). CTA HEAD FINDINGS POSTERIOR CIRCULATION: --Vertebral arteries: Normal V4 segments. --Inferior cerebellar arteries: Normal. --Basilar artery: Normal. 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  Normal left. --Posterior cerebral arteries (PCA): Normal. ANTERIOR CIRCULATION: --Intracranial internal carotid arteries: Normal. --Anterior cerebral arteries (ACA): Normal. Both A1 segments are present. Patent anterior communicating artery (Dianne Whelchel-comm). --Middle cerebral arteries (MCA): Normal. VENOUS SINUSES: As permitted by contrast timing, patent. ANATOMIC VARIANTS: None Review of the MIP images confirms the above findings. IMPRESSION: 1. No emergent large vessel occlusion. 2. Proximal occlusion of the right superior cerebellar artery. Aortic Atherosclerosis (ICD10-I70.0). Electronically Signed   By: Ulyses Jarred M.D.   On: 01/20/2021 21:54   CT ANGIO NECK CODE STROKE  Result Date: 01/20/2021 CLINICAL DATA:  Focal neuro deficit, > 6 hrs, stroke suspected EXAM: CT ANGIOGRAPHY HEAD AND NECK TECHNIQUE: Multidetector CT imaging of the head and neck was performed using the standard protocol during bolus administration of intravenous contrast. Multiplanar CT image reconstructions and MIPs were obtained to evaluate the vascular anatomy. Carotid stenosis measurements (when applicable) are obtained utilizing NASCET criteria, using the distal internal carotid diameter as the denominator. CONTRAST:  51mL OMNIPAQUE IOHEXOL 350 MG/ML SOLN COMPARISON:  None. FINDINGS: CTA NECK FINDINGS SKELETON: There is no bony  spinal canal stenosis. No lytic or blastic lesion. OTHER NECK: Normal pharynx, larynx and major salivary glands. No cervical lymphadenopathy. Unremarkable thyroid gland. UPPER CHEST: No pneumothorax or pleural effusion. No nodules or masses. AORTIC ARCH: There is calcific atherosclerosis of the aortic arch. There is no aneurysm, dissection or hemodynamically significant stenosis of the visualized portion of the aorta. Conventional 3 vessel aortic branching pattern. The visualized proximal subclavian arteries are widely patent. RIGHT CAROTID SYSTEM: Normal without aneurysm, dissection or stenosis. LEFT CAROTID SYSTEM: Normal without aneurysm, dissection or stenosis. VERTEBRAL ARTERIES: Codominant configuration. Both origins are clearly patent. There is no dissection, occlusion or flow-limiting stenosis to the skull base (V1-V3 segments). CTA HEAD FINDINGS POSTERIOR CIRCULATION: --Vertebral arteries: Normal V4 segments. --Inferior cerebellar arteries: Normal. --Basilar artery: Normal. 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  Normal left. --Posterior cerebral arteries (PCA): Normal. ANTERIOR CIRCULATION: --Intracranial internal carotid arteries: Normal. --Anterior cerebral arteries (ACA): Normal. Both A1 segments are present. Patent anterior communicating artery (Alixandrea Milleson-comm). --Middle cerebral arteries (MCA): Normal. VENOUS SINUSES: As permitted by contrast timing, patent. ANATOMIC VARIANTS: None Review of the MIP images confirms the above findings. IMPRESSION: 1. No emergent large vessel occlusion. 2. Proximal occlusion of the right superior cerebellar artery. Aortic Atherosclerosis (ICD10-I70.0). Electronically Signed   By: Ulyses Jarred M.D.   On: 01/20/2021 21:54        Scheduled Meds: .  stroke: mapping our early stages of recovery book   Does not apply Once  .  stroke: mapping our early stages of recovery book   Does not apply Once  . aspirin EC  325 mg Oral Daily  . atorvastatin  80 mg Oral  Daily  . clopidogrel  75 mg Oral Daily  . escitalopram  10 mg Oral QHS  . ezetimibe  10 mg Oral QHS  . insulin aspart  0-15 Units Subcutaneous TID WC  . levETIRAcetam  500 mg Oral BID  . pantoprazole  40 mg Oral Daily  . topiramate  75 mg Oral BID   Continuous Infusions:   LOS: 1 day    Time spent: over 30 min    Fayrene Helper, MD [facility]   To contact the attending provider between 7A-7P or the covering provider during after hours 7P-7A, please log into the web site [email or web address] and access using universal [place] password for that web site. If you do not have the password, please call the hospital operator.  01/21/2021, 2:50 PM

## 2021-01-21 NOTE — Progress Notes (Signed)
  Echocardiogram 2D Echocardiogram has been performed.  Samantha Huffman 01/21/2021, 9:29 AM

## 2021-01-22 DIAGNOSIS — I639 Cerebral infarction, unspecified: Secondary | ICD-10-CM

## 2021-01-22 LAB — COMPREHENSIVE METABOLIC PANEL
ALT: 18 U/L (ref 0–44)
AST: 20 U/L (ref 15–41)
Albumin: 3.9 g/dL (ref 3.5–5.0)
Alkaline Phosphatase: 106 U/L (ref 38–126)
Anion gap: 9 (ref 5–15)
BUN: 20 mg/dL (ref 8–23)
CO2: 23 mmol/L (ref 22–32)
Calcium: 9.5 mg/dL (ref 8.9–10.3)
Chloride: 106 mmol/L (ref 98–111)
Creatinine, Ser: 1.13 mg/dL — ABNORMAL HIGH (ref 0.44–1.00)
GFR, Estimated: 54 mL/min — ABNORMAL LOW (ref >60)
Glucose, Bld: 139 mg/dL — ABNORMAL HIGH (ref 70–99)
Potassium: 4.1 mmol/L (ref 3.5–5.1)
Sodium: 138 mmol/L (ref 135–145)
Total Bilirubin: 0.6 mg/dL (ref 0.3–1.2)
Total Protein: 7 g/dL (ref 6.5–8.1)

## 2021-01-22 LAB — GLUCOSE, CAPILLARY
Glucose-Capillary: 148 mg/dL — ABNORMAL HIGH (ref 70–99)
Glucose-Capillary: 121 mg/dL — ABNORMAL HIGH (ref 70–99)
Glucose-Capillary: 156 mg/dL — ABNORMAL HIGH (ref 70–99)
Glucose-Capillary: 226 mg/dL — ABNORMAL HIGH (ref 70–99)

## 2021-01-22 LAB — CBC WITH DIFFERENTIAL/PLATELET
Abs Immature Granulocytes: 0.05 10*3/uL (ref 0.00–0.07)
Basophils Absolute: 0 10*3/uL (ref 0.0–0.1)
Basophils Relative: 0 %
Eosinophils Absolute: 0.2 10*3/uL (ref 0.0–0.5)
Eosinophils Relative: 2 %
HCT: 43.8 % (ref 36.0–46.0)
Hemoglobin: 14.8 g/dL (ref 12.0–15.0)
Immature Granulocytes: 1 %
Lymphocytes Relative: 38 %
Lymphs Abs: 3.2 10*3/uL (ref 0.7–4.0)
MCH: 32 pg (ref 26.0–34.0)
MCHC: 33.8 g/dL (ref 30.0–36.0)
MCV: 94.8 fL (ref 80.0–100.0)
Monocytes Absolute: 0.6 10*3/uL (ref 0.1–1.0)
Monocytes Relative: 7 %
Neutro Abs: 4.3 10*3/uL (ref 1.7–7.7)
Neutrophils Relative %: 52 %
Platelets: 183 10*3/uL (ref 150–400)
RBC: 4.62 MIL/uL (ref 3.87–5.11)
RDW: 13.1 % (ref 11.5–15.5)
WBC: 8.4 10*3/uL (ref 4.0–10.5)
nRBC: 0 % (ref 0.0–0.2)

## 2021-01-22 LAB — PHOSPHORUS: Phosphorus: 4.7 mg/dL — ABNORMAL HIGH (ref 2.5–4.6)

## 2021-01-22 LAB — HEMOGLOBIN A1C
Hgb A1c MFr Bld: 7.2 % — ABNORMAL HIGH (ref 4.8–5.6)
Hgb A1c MFr Bld: 7.1 % — ABNORMAL HIGH (ref 4.8–5.6)
Mean Plasma Glucose: 160 mg/dL
Mean Plasma Glucose: 157 mg/dL

## 2021-01-22 LAB — MAGNESIUM: Magnesium: 2 mg/dL (ref 1.7–2.4)

## 2021-01-22 NOTE — Progress Notes (Signed)
Physical Therapy Treatment Patient Details Name: Samantha Huffman MRN: [id number] DOB: Sep 01, 1955 Today's Date: 01/22/2021    History of Present Illness Pt is a 65 y.o. F who presents 5/31 with dizziness and weakness. 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 Significant PMH: HTN, CABG, epilepsy, nicotine use.    PT Comments    Pt is making progress towards her goals, however continues to be limited in safe mobility by increased R sided weakness and ataxia with movement. Pt is min guard for bed mobility, min Ax2 for transfers and min A with close chair follow for ambulation with RW. Despite cuing pt with increased difficulty with processing and sequencing. D/c plan remains appropriate at this time. PT will continue to follow acutely.   Follow Up Recommendations  CIR     Equipment Recommendations  Rolling walker with 5" wheels;3in1 (PT)       Precautions / Restrictions Precautions Precautions: Fall Precaution Comments: ataxia, RLE Restrictions Weight Bearing Restrictions: No    Mobility  Bed Mobility Overal bed mobility: Needs Assistance Bed Mobility: Supine to Sit     Supine to sit: Min guard     General bed mobility comments: slightly impulsive, requiring increased cuing for not reaching up to RW to pull up, eventually had to remove RW from reach before she would reach for bedrail instead    Transfers Overall transfer level: Needs assistance Equipment used: Rolling walker (2 wheeled);None Transfers: Sit to/from Stand Sit to Stand: Min assist         General transfer comment: MinA to rise from edge of bed x 2  Ambulation/Gait Ambulation/Gait assistance: Min assist Gait Distance (Feet): 30 Feet (30x2) Assistive device: Rolling walker (2 wheeled) Gait Pattern/deviations: Ataxic;Decreased stride length;Narrow base of support;Step-to pattern Gait velocity: decreased Gait  velocity interpretation: <1.31 ft/sec, indicative of household ambulator General Gait Details: min A for steadying, unable to follow commands for increase dorsiflexion without exagerated movement, fatigues quickly requiring seated rest break for recovery      Modified Rankin (Stroke Patients Only) Modified Rankin (Stroke Patients Only) Pre-Morbid Rankin Score: No symptoms Modified Rankin: Moderately severe disability     Balance Overall balance assessment: Needs assistance Sitting-balance support: Feet supported Sitting balance-Leahy Scale: Good     Standing balance support: Bilateral upper extremity supported Standing balance-Leahy Scale: Poor Standing balance comment: reliant on UE support                            Cognition Arousal/Alertness: Awake/alert Behavior During Therapy: Flat affect Overall Cognitive Status: Impaired/Different from baseline Area of Impairment: Awareness;Problem solving                           Awareness: Emergent Problem Solving: Slow processing General Comments: A&Ox4, increased time to respond         General Comments General comments (skin integrity, edema, etc.): Pt's daughter in room      Pertinent Vitals/Pain Pain Assessment: No/denies pain           PT Goals (current goals can now be found in the care plan section) Acute Rehab PT Goals Patient Stated Goal: return to baseline PT Goal Formulation: With patient Time For Goal Achievement: 02/04/21 Potential to Achieve Goals: Good Progress towards PT goals: Progressing toward goals    Frequency    Min 4X/week  PT Plan Current plan remains appropriate       AM-PAC PT "6 Clicks" Mobility   Outcome Measure  Help needed turning from your back to your side while in a flat bed without using bedrails?: A Little Help needed moving from lying on your back to sitting on the side of a flat bed without using bedrails?: A Little Help needed moving to  and from a bed to a chair (including a wheelchair)?: A Little Help needed standing up from a chair using your arms (e.g., wheelchair or bedside chair)?: A Little Help needed to walk in hospital room?: A Lot Help needed climbing 3-5 steps with a railing? : A Lot 6 Click Score: 16    End of Session Equipment Utilized During Treatment: Gait belt Activity Tolerance: Patient tolerated treatment well Patient left: in bed;with call bell/phone within reach;with family/visitor present Nurse Communication: Mobility status PT Visit Diagnosis: Unsteadiness on feet (R26.81);Ataxic gait (R26.0);Difficulty in walking, not elsewhere classified (R26.2)     Time: 2820-8138 PT Time Calculation (min) (ACUTE ONLY): 16 min  Charges:  $Gait Training: 8-22 mins                     Kristain Hu B. Migdalia Dk PT, DPT Acute Rehabilitation Services Pager [phone] Office [phone]    [place] 01/22/2021, 2:29 PM

## 2021-01-22 NOTE — Progress Notes (Addendum)
STROKE TEAM PROGRESS NOTE   INTERVAL HISTORY No acute events. VSS. Daughter at bedside.  Neuro exam improved today as she is reporting diminished sensation has resolved at present. Her coordination issues with right upper and lower extremity persist. She denies any new complaints or concerns.  We again discussed the ongoing work up and plan of care including head CT for tomorrow am.   Vitals:   01/21/21 1832 01/21/21 2000 01/21/21 2356 01/22/21 0353  BP: (!) 151/71 (!) 150/73 (!) 170/77 (!) 180/86  Pulse: 75 78 74 73  Resp: 16 18 15 19   Temp: 98.9 F (37.2 C) 98.7 F (37.1 C) 98.9 F (37.2 C) 99.1 F (37.3 C)  TempSrc: Oral Oral Oral Oral  SpO2: 96% 100% 100% 100%   CBC:  Recent Labs  Lab 01/20/21 1240 01/20/21 1248 01/22/21 0253  WBC 8.1  --  8.4  NEUTROABS 4.7  --  4.3  HGB 16.5* 16.7* 14.8  HCT 49.3* 49.0* 43.8  MCV 95.4  --  94.8  PLT 220  --  235   Basic Metabolic Panel:  Recent Labs  Lab 01/21/21 0508 01/22/21 0253  NA 138 138  K 3.9 4.1  CL 108 106  CO2 23 23  GLUCOSE 127* 139*  BUN 14 20  CREATININE 0.98 1.13*  CALCIUM 9.6 9.5  MG  --  2.0  PHOS  --  4.7*   Lipid Panel:  Recent Labs  Lab 01/21/21 0508  CHOL 123  TRIG 118  HDL 42  CHOLHDL 2.9  VLDL 24  LDLCALC 57   HgbA1c:  Recent Labs  Lab 01/21/21 0508  HGBA1C 7.1*   Urine Drug Screen: No results for input(s): LABOPIA, COCAINSCRNUR, LABBENZ, AMPHETMU, THCU, LABBARB in the last 168 hours.  Alcohol Level No results for input(s): ETH in the last 168 hours.  IMAGING past 24 hours No results found. PHYSICAL EXAM General: Sitting upright on  ER bed feeding herself spaghetti  with right hand.  Constitutional: Appears well-developed and well-nourished.  Psych: Mildly anxious and frustrated regarding stroke deficit Head: Normocephalic.  Cardiovascular: Normal rate and regular rhythm.  Respiratory: Effort normal, non-labored breathing  Skin: Warm and dry  Neuro: Mental  Status: Patient is fully awake, alert, oriented to time, place, person  Patient is able to give a clear and coherent history. No signs of aphasia or neglect. Follows 1-2 step commands.   Cranial Nerves: II: Visual Fields are full. Pupils are equal, round, and reactive to light.   III,IV, VI: EOMI without ptosis or diploplia.  V: Facial sensation is symmetric to temperature VII: Facial movement is symmetric.  VIII: hearing is intact to voice X: Uvula elevates symmetrically XI: Shoulder shrug is symmetric. XII: tongue is midline without atrophy or fasciculations.  Motor: Tone is normal. Bulk is normal. 5/5 strength was present in all four extremities.  Sensory: Sensation is symmetric to light touch and temperature in the arms and legs. Plantars: Toes are downgoing bilaterally.  Cerebellar: Ataxia in right upper extremity and right lower extremity with finger-to-nose and heel-to-shin  ASSESSMENT/PLAN 57 old female with PMH significant for seizure, CAD, OSA, DM2, Migraines, HLD, HTN, current cigarette smoking presented with sudden onset of dizziness and weakness after lifting a container of water bottles that started on 01/19/2021 at around [place].  Not a candidate for tPA and thrombectomy as patient outside window.   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 Smaller acute infarct right PICA territory. Small acute infarct left thalamus. Etiology unclear. Possible  vertebral dissection.    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 Smaller acute infarct right PICA territory. Small acute infarct left thalamus.  MRA : Acute to subacute right cerebellar infarct.  CTA Head and Neck: No emergent large vessel occlusion. Proximal occlusion of the right superior cerebellar artery.  Repeat HCT tomorrow 6/3 am is pending  2D Echo: EF 60-65%. Mild LVH.  Grade I diastolic dysfunction. No shunt, thrombus or wall motion  abnormalities.   LDL 57  HgbA1c 7.6  VTE prophylaxis - is recommended     Diet   Diet heart healthy/carb modified Room service appropriate? Yes; Fluid consistency: Thin    On ASA 81mg  and Plavix prior to admission  Now on ASA 325mg  and Plavix 75 mg daily. Recommend aspirin 325 and Plavix 75 DAPT for 3 months and then back to aspirin 81 and Plavix 75 DAPT home dose.   Therapy recommendations:  TBD  Disposition:  Pending  Hypertension  Stable . Permissive hypertension (OK if < 220/120) but gradually normalize in 5-7 days . Long-term BP goal normotensive  Hyperlipidemia  Home meds:    LDL 57, goal < 70  High intensity statin home medication of Lipitor 80mg  on board  Continue statin at discharge  Diabetes type II Uncontrolled  HgbA1c 7.6, higher than goal < 7.0  CBGs Recent Labs    01/21/21 1602 01/21/21 2102 01/22/21 0611  GLUCAP 147* 115* 148*      Management per primary team  Seizure disorder  Continue Keppra  Other Stroke Risk Factors  Current Cigarette smoker, advised to stop smoking  Current ETOH use, alcohol level  advised to drink no more than 1drink a day  Family hx stroke (Brother)  Coronary artery disease s/p CABG 2014 on DAPT  Migraines  Obstructive sleep apnea  Other Active Problems  Hospital day # 2  Delila A Bailey-Modzik, NP-C  ATTENDING NOTE: I reviewed above note and agree with the assessment and plan. Pt was seen and examined.   Granddaughter at the bedside. Pt lying in bed, denies any HA or N/V. Orientated x 3, awake alert. Moving all extremities. No significant ataxia. On DAPT and statin. Will repeat CT head in am to rule out any hydrocephalus. Will follow  For detailed assessment and plan, please refer to above as I have made changes wherever appropriate.   Rosalin Hawking, MD PhD Stroke Neurology 01/22/2021 3:41 PM     To contact Stroke Continuity provider, please refer to [email or web address] After hours, contact General  Neurology

## 2021-01-22 NOTE — Consult Note (Signed)
Physical Medicine and Rehabilitation Consult   Reason for Consult: Stroke with functional deficits Referring Physician: Dr. Florene Glen   HPI: Samantha Huffman is a 65 y.o. female is a with history of HTN, CAD s/p CABG, T2DM, seizures, migraines, exc bilateral cataracts in the past month;  who was admitted on 01/20/21 with reports of RUE/RLE weakness and unsteady gait for 2-3 days. She reported having lifted a heavy case of water the day before and started feeling dizzy. CTA head/neck showed proximal occlusion of R-SCA and no LVO.  MRI brain done revealing acute/subacute right cerebellar stroke and smaller infarct right PICA and left thalamus.   2D echo showed EF 60-65% with trivial MVR.  Dr. Erlinda Hong question vertebral dissection as cause of stroke and recommended repeating CT head in 2 days to r/o hydrocephalus and to increase DAPT to ASA 325 mg/Plavix 75 X 3 months followed by lower ASA dose. Therapy evaluations completed revealing ataxic gait with balance deficits,  decreased coordination on the right as well as tachycardia with activity. CIR recommended due to functional decline.    Pt had cataract surgery for L eye 1 week ago and R eye 2 weeks ago- still very light sensitive- not sure if has any blurry vision from this already.  Wears a CPAP at home- will have family bring in.  Per daughter, pt is typically "clear" might have mild dementia/cognitive issues, but if so, very mild.  Usually has BM s every day- LBM was Monday.  Denies pain.  Admits to smoking ~ 1ppd- but had told others .75 ppd- not clear? Lives alone- but daughters both live in [place] and are close by. Daughter [name] is moving to a new home soon.    Review of Systems  Constitutional: Negative for chills and fever.  HENT: Negative for hearing loss.   Eyes: Positive for blurred vision (On the right and light sensitivity due to recent surgery). Negative for pain.       Had cataract surgery for both eyes in last 2 weeks   Respiratory: Positive for shortness of breath. Negative for cough.   Cardiovascular: Negative for chest pain and palpitations.  Gastrointestinal: Negative for constipation and heartburn.  Genitourinary: Negative for dysuria and urgency.  Musculoskeletal: Negative for back pain, myalgias and neck pain.  Skin: Negative for rash.  Neurological: Positive for dizziness and weakness. Negative for headaches.  All other systems reviewed and are negative.     Past Medical History:  Diagnosis Date  . Carotid artery stenosis    1-39% bilateral stenosis by dopplers 08/2020  . Coronary artery disease    a. s/p CABG 10/2012.  cath 05/2016 with moderate LM stenosis, mild LCx and RCA stenosis and occluded LAD with patent LIMA to LAD, free radial to OM.  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  Marland Kitchen Dyslipidemia   . Family history of adverse reaction to anesthesia    mother and sister ponv  . Generalized convulsive epilepsy without mention of intractable epilepsy   . GERD (gastroesophageal reflux disease)   . History of kidney stones   . Hypertension   . Memory deficit    slight  . Migraine    "controlled on daily RX " (06/11/2016)  . Nephrolithiasis 07/30/2015   S/p lithotripsy x 3 and right and left ureter stents  . OSA (obstructive sleep apnea)    cpap   . PONV (postoperative nausea and vomiting)   . Seizures ([place])    "controlled w/daily RX; started  in 2012; dr thinks they might be from the migraines; last sz was early part of 2016" (06/11/2016)  . Type II diabetes mellitus (HCC)    metformin  . Vasovagal syncope 07/30/2015   2011    Past Surgical History:  Procedure Laterality Date  . CARDIAC CATHETERIZATION  2014; 06/11/2016  . CARDIAC CATHETERIZATION N/A 06/11/2016   Procedure: Left Heart Cath and Cors/Grafts Angiography;  Surgeon: Sherren Mocha, MD;  Location: [facility];  Service: Cardiovascular;  Laterality: N/A;  . CESAREAN SECTION    . CORONARY ANGIOPLASTY    .  CORONARY ARTERY BYPASS GRAFT  March, 2014   LIMA to LAD, left radial to LCx x 2  . CYSTOSCOPY W/ URETERAL STENT PLACEMENT Left 01/16/2019   Procedure: CYSTOSCOPY WITH RETROGRADE PYELOGRAM/URETERAL LEFT STENT PLACEMENT;  Surgeon: Ardis Hughs, MD;  Location: [facility];  Service: Urology;  Laterality: Left;  . CYSTOSCOPY/URETEROSCOPY/HOLMIUM LASER/STENT PLACEMENT Left 01/24/2019   Procedure: CYSTOSCOPY, LEFT URETEROSCOPY, HOLMIUM LASER, STONE REMOVAL AND STENT EXCHANGE;  Surgeon: Ardis Hughs, MD;  Location: [facility];  Service: Urology;  Laterality: Left;  . LITHOTRIPSY  X 3  . RIGHT/LEFT HEART CATH AND CORONARY/GRAFT ANGIOGRAPHY N/A 09/08/2018   Procedure: RIGHT/LEFT HEART CATH AND CORONARY/GRAFT ANGIOGRAPHY;  Surgeon: Martinique, Peter M, MD;  Location: [facility];  Service: Cardiovascular;  Laterality: N/A;  . TOTAL ABDOMINAL HYSTERECTOMY     ovaries took before hysterectomy  . TUBAL LIGATION    . URETERAL STENT PLACEMENT      Family History  Problem Relation Age of Onset  . Cancer Father   . Diabetes Father   . Diabetes Brother   . Stroke Brother   . Diabetes Brother   . Heart disease Brother        CAD with CABG  . Heart disease Sister        CAD with MI  . Heart attack Sister     Social History: Lives alone and independent without AD. Daughter in town works and questions ability to provide supervision post discharge. She reports that she has been smoking cigarettes--1.5 PPD every 2 days and has been smoking since she was a teenage.  She quit smokeless tobacco use about 8 years ago.  Her smokeless tobacco use included snuff and chew. She reports current alcohol use--1-2 drinks every couple of weeks.  She reports that she does not use drugs.   Allergies  Allergen Reactions  . Depakote [Divalproex Sodium] Other (See Comments)    Hyperammonemia  . Other Other (See Comments)  . Penicillins Hives, Other (See Comments) and Rash    Other reaction(s): GI intolerance Has  patient had a PCN reaction causing immediate rash, facial/tongue/throat swelling, SOB or lightheadedness with hypotension: YES Has patient had a PCN reaction causing severe rash involving mucus membranes or skin necrosis: NO Has patient had a PCN reaction that required hospitalizationNO Has patient had a PCN reaction occurring within the last 10 years: NO If all of the above answers are "NO", then may proceed with Cephalosporin use. Other reaction(s): GI Upset (intolerance) Other reaction(s): GI intolerance Has patient had a PCN reaction causing immediate rash, facial/tongue/throat swelling, SOB or lightheadedness with hypotension: YES Has patient had a PCN reaction causing severe rash involving mucus membranes or skin necrosis: NO Has patient had a PCN reaction that required hospitalizationNO Has patient had a PCN reaction occurring within the last 10 years: NO If all of the above answers are "NO", then may proceed with Cephalosporin  use.    Medications Prior to Admission  Medication Sig Dispense Refill  . ketorolac (ACULAR) 0.5 % ophthalmic solution Place 1 drop into both eyes 3 (three) times daily. Starting 2 days prior to surgery and 3 weeks following surgery.    Marland Kitchen ofloxacin (OCUFLOX) 0.3 % ophthalmic solution Place 1 drop into both eyes 3 (three) times daily. Starting 2 days prior to surgery and 3 weeks following surgery.    . prednisoLONE acetate (PRED FORTE) 1 % ophthalmic suspension Place 1 drop into both eyes 3 (three) times daily. Starting 2 days prior to surgery and 3 weeks following surgery.    Marland Kitchen acetaminophen (TYLENOL) 325 MG tablet Take 2 tablets (650 mg total) by mouth every 4 (four) hours as needed for headache or mild pain. (Patient not taking: Reported on 01/20/2021)    . Alirocumab (PRALUENT) 75 MG/ML SOAJ INJECT CONTENTS OF 1 PEN INTO THE SKIN EVERY 14 DAYS (Patient not taking: Reported on 01/20/2021) 2 mL 11  . ALPRAZolam (XANAX) 0.25 MG tablet Take 0.25 mg by mouth 3 (three)  times daily as needed for anxiety.  (Patient not taking: Reported on 01/20/2021)  0  . aspirin EC 81 MG tablet Take 81 mg by mouth daily. (Patient not taking: Reported on 01/20/2021)    . atorvastatin (LIPITOR) 80 MG tablet Take 1 tablet (80 mg total) by mouth daily. (Patient not taking: Reported on 01/20/2021) 90 tablet 2  . carvedilol (COREG) 12.5 MG tablet Take 1 tablet (12.5 mg total) by mouth 2 (two) times daily. (Patient not taking: Reported on 01/20/2021) 180 tablet 2  . clopidogrel (PLAVIX) 75 MG tablet Take 1 tablet (75 mg total) by mouth daily. (Patient not taking: Reported on 01/20/2021) 90 tablet 2  . clotrimazole-betamethasone (LOTRISONE) cream Apply 1 application topically daily as needed (skin folds (under breasts)).  (Patient not taking: Reported on 01/20/2021)    . escitalopram (LEXAPRO) 10 MG tablet Take 10 mg by mouth at bedtime.  (Patient not taking: Reported on 01/20/2021)    . ezetimibe (ZETIA) 10 MG tablet Take 1 tablet (10 mg total) by mouth at bedtime. (Patient not taking: Reported on 01/20/2021) 90 tablet 3  . furosemide (LASIX) 20 MG tablet Take 20 mg by mouth daily. (Patient not taking: Reported on 01/20/2021)    . isosorbide mononitrate (IMDUR) 120 MG 24 hr tablet Take 1 tablet (120 mg total) by mouth daily. (Patient not taking: Reported on 01/20/2021) 90 tablet 2  . levETIRAcetam (KEPPRA) 500 MG tablet Take 1 tablet (500 mg total) by mouth 2 (two) times daily. (Patient not taking: Reported on 01/20/2021) 180 tablet 3  . lisinopril (ZESTRIL) 20 MG tablet Take 1 tablet (20 mg total) by mouth daily. (Patient not taking: Reported on 01/20/2021) 90 tablet 2  . metFORMIN (GLUCOPHAGE-XR) 500 MG 24 hr tablet Take 500 mg by mouth daily with breakfast.  (Patient not taking: Reported on 01/20/2021)  12  . Nicotine 21-14-7 MG/24HR KIT Use as directed (Patient not taking: Reported on 01/20/2021) 1 kit 0  . nitroGLYCERIN (NITROSTAT) 0.4 MG SL tablet Place 1 tablet (0.4 mg total) under the tongue  every 5 (five) minutes x 3 doses as needed for chest pain. (Patient not taking: Reported on 01/20/2021) 25 tablet 4  . pantoprazole (PROTONIX) 40 MG tablet TAKE 1 TABLET BY MOUTH EVERY DAY (Patient not taking: Reported on 01/20/2021) 90 tablet 2  . Potassium Citrate 15 MEQ (1620 MG) TBCR Take 1 tablet by mouth in the morning and at bedtime. (  Patient not taking: Reported on 01/20/2021)    . topiramate (TOPAMAX) 25 MG tablet Take 3 tablets (75 mg total) by mouth 2 (two) times daily. (Patient not taking: Reported on 01/20/2021) 540 tablet 4    Home: Home Living Family/patient expects to be discharged to:: Private residence Living Arrangements: Alone Available Help at Discharge: Family,Available PRN/intermittently Type of Home: Apartment Home Access: Level entry Home Layout: One level Bathroom Shower/Tub: Tub/shower unit Home Equipment: None  Functional History: Prior Function Level of Independence: Independent Functional Status:  Mobility: Bed Mobility Overal bed mobility: Needs Assistance Bed Mobility: Sit to Supine Supine to sit: Min guard Sit to supine: Min guard,HOB elevated General bed mobility comments: assist for BLE Transfers Overall transfer level: Needs assistance Equipment used: Rolling walker (2 wheeled),None Transfers: Sit to/from Stand Sit to Stand: Min assist General transfer comment: MinA to rise from EOB and from [facility] Ambulation/Gait Ambulation/Gait assistance: Min assist,Mod assist Gait Distance (Feet): 30 Feet Assistive device: Rolling walker (2 wheeled),None Gait Pattern/deviations: Ataxic,Decreased stride length,Narrow base of support,Step-to pattern General Gait Details: Initially trialed no AD and pt performed pre gait i.e. weight shifts with modA and posterior lean, progressing to minA with use of walker, step to pattern with erratic right step length. Cues provided for sequencing and walker use. Gait velocity: decreased Gait velocity interpretation: <1.31  ft/sec, indicative of household ambulator    ADL: ADL Overall ADL's : Needs assistance/impaired Eating/Feeding: Set up,Sitting,Bed level Eating/Feeding Details (indicate cue type and reason): supported in bed Grooming: Min guard,Standing Grooming Details (indicate cue type and reason): standing x3 mins for task Upper Body Bathing: Min guard,Sitting,Cueing for safety,Cueing for sequencing Upper Body Bathing Details (indicate cue type and reason): Pt with cues to hold on with single UE for support Lower Body Bathing: Moderate assistance,Cueing for safety,Cueing for sequencing,Sitting/lateral leans,Sit to/from stand Lower Body Bathing Details (indicate cue type and reason): assist for safety Upper Body Dressing : Minimal assistance,Sitting Lower Body Dressing: Moderate assistance,Sitting/lateral leans,Sit to/from stand,Cueing for safety Toilet Transfer: Moderate assistance,Stand-pivot,BSC,RW Toilet Transfer Details (indicate cue type and reason): cues for each step Toileting- Clothing Manipulation and Hygiene: Minimal assistance,Cueing for safety,Sit to/from stand Toileting - Clothing Manipulation Details (indicate cue type and reason): reaching anterior pericare for a few seconds. Functional mobility during ADLs: Moderate assistance,Rolling walker,Cueing for safety,Cueing for sequencing General ADL Comments: Pt limited by decreased coordination/ataxic movements on R side, R LE buckling, decreased ability to care for self and decreased mobility with RW. Pt often staring into space and requiring cues to attend to current task. Pt with delatyed processing and other cognitive impairments. Pt's daughters in room confirming that at baseline, pt is sharp as a tack at home.  Cognition: Cognition Overall Cognitive Status: Impaired/Different from baseline Orientation Level: Oriented X4 Cognition Arousal/Alertness: Awake/alert Behavior During Therapy: Flat affect Overall Cognitive Status:  Impaired/Different from baseline Area of Impairment: Awareness,Problem solving,Safety/judgement Safety/Judgement: Decreased awareness of safety,Decreased awareness of deficits Awareness: Emergent Problem Solving: Slow processing,Requires verbal cues,Requires tactile cues General Comments: A&Ox4, increased time to respond- at times pt would close eyes with decreased arousal and would easily awaken. Unsure if pt was very fatigued after PT session just before this session.   Blood pressure (!) 180/86, pulse 73, temperature 99.1 F (37.3 C), temperature source Oral, resp. rate 19, SpO2 100 %. Physical Exam Vitals and nursing note reviewed. Exam conducted with a chaperone present.  Constitutional:      Comments: Kept eyes closed for most of the exam.  Pt laying supine in  bed- with cataract sunglasses on ; Daughter Keya in room, NAD  HENT:     Head: Normocephalic.     Comments: Smile equal tongue midline    Right Ear: External ear normal.     Left Ear: External ear normal.     Nose: Nose normal. No congestion.     Mouth/Throat:     Mouth: Mucous membranes are dry.     Pharynx: Oropharynx is clear. No oropharyngeal exudate.  Eyes:     General:        Right eye: No discharge.        Left eye: No discharge.     Extraocular Movements: Extraocular movements intact.     Comments: Would barely open eyes for me, fyi  Cardiovascular:     Rate and Rhythm: Normal rate and regular rhythm.     Heart sounds: Normal heart sounds. No murmur heard. No gallop.   Pulmonary:     Comments: CTA B/L- no W/R/R- good air movement  Abdominal:     Comments: Soft, maybe slightly distended; a little TTP in lower quadrants, but no rebound; hypoactive BS  Musculoskeletal:     Cervical back: Normal range of motion and neck supple.     Comments: UEs and LEs' 5/5 in all muscle groups tested  Skin:    General: Skin is warm and dry.     Comments: No skin breakdown seen  Neurological:     Mental Status: She is  alert.     Comments: Mild left facial weakness without dysarthria. She was able to answer orientation questions and follow simple commands without difficulty. Kept eyes closed due to visual disturbance on right. Horizontal nystagmus worse to left field. Significant ataxia RUE with attempts at finger to nose as well as ataxic movements RLE.  Pt has typical searching pattern finger to nose on RUE; is OK on LUE; Also very impaired rapid alternating movements on RUE.  Intact to light touch in all 4 extremities  Psychiatric:     Comments: Flat. quiet     Results for orders placed or performed during the hospital encounter of 01/20/21 (from the past 24 hour(s))  CBG monitoring, ED     Status: Abnormal   Collection Time: 01/21/21 11:46 AM  Result Value Ref Range   Glucose-Capillary 144 (H) 70 - 99 mg/dL  Glucose, capillary     Status: Abnormal   Collection Time: 01/21/21  4:02 PM  Result Value Ref Range   Glucose-Capillary 147 (H) 70 - 99 mg/dL   Comment 1 Notify RN   Glucose, capillary     Status: Abnormal   Collection Time: 01/21/21  9:02 PM  Result Value Ref Range   Glucose-Capillary 115 (H) 70 - 99 mg/dL  CBC with Differential/Platelet     Status: None   Collection Time: 01/22/21  2:53 AM  Result Value Ref Range   WBC 8.4 4.0 - 10.5 K/uL   RBC 4.62 3.87 - 5.11 MIL/uL   Hemoglobin 14.8 12.0 - 15.0 g/dL   HCT 43.8 36.0 - 46.0 %   MCV 94.8 80.0 - 100.0 fL   MCH 32.0 26.0 - 34.0 pg   MCHC 33.8 30.0 - 36.0 g/dL   RDW 13.1 11.5 - 15.5 %   Platelets 183 150 - 400 K/uL   nRBC 0.0 0.0 - 0.2 %   Neutrophils Relative % 52 %   Neutro Abs 4.3 1.7 - 7.7 K/uL   Lymphocytes Relative 38 %   Lymphs Abs  3.2 0.7 - 4.0 K/uL   Monocytes Relative 7 %   Monocytes Absolute 0.6 0.1 - 1.0 K/uL   Eosinophils Relative 2 %   Eosinophils Absolute 0.2 0.0 - 0.5 K/uL   Basophils Relative 0 %   Basophils Absolute 0.0 0.0 - 0.1 K/uL   Immature Granulocytes 1 %   Abs Immature Granulocytes 0.05 0.00 - 0.07  K/uL  Comprehensive metabolic panel     Status: Abnormal   Collection Time: 01/22/21  2:53 AM  Result Value Ref Range   Sodium 138 135 - 145 mmol/L   Potassium 4.1 3.5 - 5.1 mmol/L   Chloride 106 98 - 111 mmol/L   CO2 23 22 - 32 mmol/L   Glucose, Bld 139 (H) 70 - 99 mg/dL   BUN 20 8 - 23 mg/dL   Creatinine, Ser 1.13 (H) 0.44 - 1.00 mg/dL   Calcium 9.5 8.9 - 10.3 mg/dL   Total Protein 7.0 6.5 - 8.1 g/dL   Albumin 3.9 3.5 - 5.0 g/dL   AST 20 15 - 41 U/L   ALT 18 0 - 44 U/L   Alkaline Phosphatase 106 38 - 126 U/L   Total Bilirubin 0.6 0.3 - 1.2 mg/dL   GFR, Estimated 54 (L) >60 mL/min   Anion gap 9 5 - 15  Magnesium     Status: None   Collection Time: 01/22/21  2:53 AM  Result Value Ref Range   Magnesium 2.0 1.7 - 2.4 mg/dL  Phosphorus     Status: Abnormal   Collection Time: 01/22/21  2:53 AM  Result Value Ref Range   Phosphorus 4.7 (H) 2.5 - 4.6 mg/dL  Glucose, capillary     Status: Abnormal   Collection Time: 01/22/21  6:11 AM  Result Value Ref Range   Glucose-Capillary 148 (H) 70 - 99 mg/dL   CT HEAD WO CONTRAST  Result Date: 01/20/2021 CLINICAL DATA:  Unsteady gait and leg weakness. EXAM: CT HEAD WITHOUT CONTRAST TECHNIQUE: Contiguous axial images were obtained from the base of the skull through the vertex without intravenous contrast. COMPARISON:  None. FINDINGS: Brain: There is a 4 cm infarct in the superior aspect of the right cerebellar hemisphere consistent with an acute to subacute appearance. Cytotoxic edema results in mild local mass effect. There is also likely a separate subcentimeter subacute infarct more posteriorly in the right cerebellar hemisphere. No intracranial hemorrhage, midline shift, or extra-axial fluid collection is identified. The ventricles are normal in size. Vascular: Calcified atherosclerosis at the skull base. Skull: No fracture or suspicious osseous lesion. Sinuses/Orbits: Minimal bilateral ethmoid air cell mucosal thickening. Clear mastoid air  cells. Bilateral cataract extraction. Other: None. 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 Electronically Signed   By: Logan Bores M.D.   On: 01/20/2021 13:43   MR ANGIO HEAD WO CONTRAST  Result Date: 01/20/2021 CLINICAL DATA:  Stroke EXAM: MRA HEAD WITHOUT CONTRAST TECHNIQUE: Angiographic images of the Circle of Willis were acquired using MRA technique without intravenous contrast. COMPARISON:  MRI head 01/20/2021 FINDINGS: Anterior circulation: Internal carotid artery widely patent bilaterally. Anterior and middle cerebral arteries patent bilaterally without stenosis or large vessel occlusion. Posterior circulation: Both vertebral arteries patent to the basilar without stenosis. Right PICA not visualized. Very small left PICA Ca. Left AICA patent. Right AICA not visualized. Right superior cerebral artery occluded proximally. Left superior cerebellar artery patent. Posterior cerebral arteries patent bilaterally. Anatomic variants: None Other: Negative for cerebral aneurysm. IMPRESSION: Both vertebral arteries patent to the basilar. Right  PICA not visualized. Small left PICA. Right superior cerebral artery occluded proximally compatible with acute infarct. Electronically Signed   By: Franchot Gallo M.D.   On: 01/20/2021 20:10   MR Angiogram Neck W or Wo Contrast  Result Date: 01/20/2021 CLINICAL DATA:  Stroke EXAM: MRA NECK WITHOUT AND WITH CONTRAST TECHNIQUE: Multiplanar and multiecho pulse sequences of the neck were obtained without and with intravenous contrast. Angiographic images of the neck were obtained using MRA technique without and with intravenous contrast. CONTRAST:  42m GADAVIST GADOBUTROL 1 MMOL/ML IV SOLN COMPARISON:  MRI head 01/20/2021 FINDINGS: Antegrade flow in the carotid and vertebral arteries bilaterally Mild atherosclerotic disease in the carotid bulb bilaterally without flow limiting stenosis. Remainder of the internal carotid artery is widely patent through the  cavernous segment. Left vertebral artery is widely patent to the basilar without stenosis Mild diffuse narrowing of the proximal right vertebral artery. Remainder of the right vertebral artery is patent to the basilar. IMPRESSION: Mild atherosclerotic disease in the carotid bowel bulb bilaterally without significant stenosis Mild narrowing proximal right vertebral artery. This may be due to atherosclerotic disease or dissection. Given the posterior fossa infarcts, consider CT angio head and neck for further evaluation. Electronically Signed   By: CFranchot GalloM.D.   On: 01/20/2021 20:07   MR BRAIN WO CONTRAST  Result Date: 01/20/2021 CLINICAL DATA:  Acute neuro deficit.  Stroke. EXAM: MRI HEAD WITHOUT CONTRAST TECHNIQUE: Multiplanar, multiecho pulse sequences of the brain and surrounding structures were obtained without intravenous contrast. COMPARISON:  CT head 01/20/2021 FINDINGS: Brain: 3 cm acute infarct in the right superior cerebellar artery territory. Additional small area of acute infarct in the right PICA territory. Small acute infarct in the left thalamus. Ventricle size is normal. Negative for hemorrhage or mass. Few small white matter hyperintensities bilaterally likely due to chronic microvascular ischemia. Vascular: Normal arterial flow voids. Skull and upper cervical spine: Negative Sinuses/Orbits: Paranasal sinuses clear. Bilateral cataract extraction Other: None IMPRESSION: 3 cm acute infarct right superior cerebellum. Smaller acute infarct right PICA territory. Small acute infarct left thalamus. Electronically Signed   By: CFranchot GalloM.D.   On: 01/20/2021 20:02   ECHOCARDIOGRAM COMPLETE  Result Date: 01/21/2021    ECHOCARDIOGRAM REPORT   Patient Name:   JMAISYN NOURIDate of Exam: 01/21/2021 Medical Rec #:  [id number]   Height:       61.0 in Accession #:    [id number]  Weight:       143.0 lb Date of Birth:  8Mar 28, 1957   BSA:          1.638 m Patient Age:    89years     BP:            156/99 mmHg Patient Gender: F            HR:           66 bpm. Exam Location:  Inpatient Procedure: 2D Echo, Cardiac Doppler and Color Doppler Indications:    Stroke I63.9  History:        Patient has prior history of Echocardiogram examinations, most                 recent 08/07/2018. CAD, Carotid Disease; Risk                 Factors:Hypertension, Diabetes, Dyslipidemia, Sleep Apnea and                 GERD. Seizures.  Sonographer:    Jonelle Sidle Dance Referring Phys: [id number] EKTA V PATEL IMPRESSIONS  1. Left ventricular ejection fraction, by estimation, is 60 to 65%. The left ventricle has normal function. The left ventricle has no regional wall motion abnormalities. There is mild left ventricular hypertrophy. Left ventricular diastolic parameters are consistent with Grade I diastolic dysfunction (impaired relaxation).  2. Right ventricular systolic function is normal. The right ventricular size is normal. Tricuspid regurgitation signal is inadequate for assessing PA pressure.  3. The mitral valve is grossly normal. Trivial mitral valve regurgitation. No evidence of mitral stenosis.  4. The aortic valve is tricuspid. Aortic valve regurgitation is not visualized. No aortic stenosis is present.  5. The inferior vena cava is normal in size with greater than 50% respiratory variability, suggesting right atrial pressure of 3 mmHg. Conclusion(s)/Recommendation(s): No intracardiac source of embolism detected on this transthoracic study. A transesophageal echocardiogram is recommended to exclude cardiac source of embolism if clinically indicated. FINDINGS  Left Ventricle: Left ventricular ejection fraction, by estimation, is 60 to 65%. The left ventricle has normal function. The left ventricle has no regional wall motion abnormalities. The left ventricular internal cavity size was normal in size. There is  mild left ventricular hypertrophy. Left ventricular diastolic parameters are consistent with Grade I diastolic  dysfunction (impaired relaxation). Right Ventricle: The right ventricular size is normal. No increase in right ventricular wall thickness. Right ventricular systolic function is normal. Tricuspid regurgitation signal is inadequate for assessing PA pressure. Left Atrium: Left atrial size was normal in size. Right Atrium: Right atrial size was normal in size. Pericardium: Trivial pericardial effusion is present. Mitral Valve: The mitral valve is grossly normal. Mild mitral annular calcification. Trivial mitral valve regurgitation. No evidence of mitral valve stenosis. Tricuspid Valve: The tricuspid valve is normal in structure. Tricuspid valve regurgitation is not demonstrated. No evidence of tricuspid stenosis. Aortic Valve: The aortic valve is tricuspid. Aortic valve regurgitation is not visualized. No aortic stenosis is present. Pulmonic Valve: The pulmonic valve was normal in structure. Pulmonic valve regurgitation is trivial. No evidence of pulmonic stenosis. Aorta: The aortic root is normal in size and structure. Venous: The inferior vena cava is normal in size with greater than 50% respiratory variability, suggesting right atrial pressure of 3 mmHg. IAS/Shunts: No atrial level shunt detected by color flow Doppler.  LEFT VENTRICLE PLAX 2D LVIDd:         4.00 cm  Diastology LVIDs:         3.00 cm  LV e' medial:    5.77 cm/s LV PW:         1.20 cm  LV E/e' medial:  8.8 LV IVS:        1.10 cm  LV e' lateral:   8.16 cm/s LVOT diam:     1.80 cm  LV E/e' lateral: 6.2 LV SV:         40 LV SV Index:   25 LVOT Area:     2.54 cm  RIGHT VENTRICLE            IVC RV Basal diam:  2.30 cm    IVC diam: 1.10 cm RV S prime:     8.59 cm/s TAPSE (M-mode): 1.4 cm LEFT ATRIUM             Index       RIGHT ATRIUM           Index LA diam:        2.80 cm 1.71  cm/m  RA Area:     10.80 cm LA Vol (A2C):   42.3 ml 25.83 ml/m RA Volume:   21.80 ml  13.31 ml/m LA Vol (A4C):   37.9 ml 23.14 ml/m LA Biplane Vol: 40.7 ml 24.85 ml/m   AORTIC VALVE LVOT Vmax:   71.70 cm/s LVOT Vmean:  48.700 cm/s LVOT VTI:    0.159 m  AORTA Ao Root diam: 3.00 cm Ao Asc diam:  2.70 cm MITRAL VALVE MV Area (PHT): 2.32 cm    SHUNTS MV Decel Time: 327 msec    Systemic VTI:  0.16 m MV E velocity: 50.60 cm/s  Systemic Diam: 1.80 cm MV A velocity: 69.40 cm/s MV E/A ratio:  0.73 Cherlynn Kaiser MD Electronically signed by Cherlynn Kaiser MD Signature Date/Time: 01/21/2021/10:46:15 AM    Final    CT ANGIO HEAD CODE STROKE  Result Date: 01/20/2021 CLINICAL DATA:  Focal neuro deficit, > 6 hrs, stroke suspected EXAM: CT ANGIOGRAPHY HEAD AND NECK TECHNIQUE: Multidetector CT imaging of the head and neck was performed using the standard protocol during bolus administration of intravenous contrast. Multiplanar CT image reconstructions and MIPs were obtained to evaluate the vascular anatomy. Carotid stenosis measurements (when applicable) are obtained utilizing NASCET criteria, using the distal internal carotid diameter as the denominator. CONTRAST:  58m OMNIPAQUE IOHEXOL 350 MG/ML SOLN COMPARISON:  None. FINDINGS: CTA NECK FINDINGS SKELETON: There is no bony spinal canal stenosis. No lytic or blastic lesion. OTHER NECK: Normal pharynx, larynx and major salivary glands. No cervical lymphadenopathy. Unremarkable thyroid gland. UPPER CHEST: No pneumothorax or pleural effusion. No nodules or masses. AORTIC ARCH: There is calcific atherosclerosis of the aortic arch. There is no aneurysm, dissection or hemodynamically significant stenosis of the visualized portion of the aorta. Conventional 3 vessel aortic branching pattern. The visualized proximal subclavian arteries are widely patent. RIGHT CAROTID SYSTEM: Normal without aneurysm, dissection or stenosis. LEFT CAROTID SYSTEM: Normal without aneurysm, dissection or stenosis. VERTEBRAL ARTERIES: Codominant configuration. Both origins are clearly patent. There is no dissection, occlusion or flow-limiting stenosis to the skull base  (V1-V3 segments). CTA HEAD FINDINGS POSTERIOR CIRCULATION: --Vertebral arteries: Normal V4 segments. --Inferior cerebellar arteries: Normal. --Basilar artery: Normal. 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  Normal left. --Posterior cerebral arteries (PCA): Normal. ANTERIOR CIRCULATION: --Intracranial internal carotid arteries: Normal. --Anterior cerebral arteries (ACA): Normal. Both A1 segments are present. Patent anterior communicating artery (a-comm). --Middle cerebral arteries (MCA): Normal. VENOUS SINUSES: As permitted by contrast timing, patent. ANATOMIC VARIANTS: None Review of the MIP images confirms the above findings. IMPRESSION: 1. No emergent large vessel occlusion. 2. Proximal occlusion of the right superior cerebellar artery. Aortic Atherosclerosis (ICD10-I70.0). Electronically Signed   By: KUlyses JarredM.D.   On: 01/20/2021 21:54   CT ANGIO NECK CODE STROKE  Result Date: 01/20/2021 CLINICAL DATA:  Focal neuro deficit, > 6 hrs, stroke suspected EXAM: CT ANGIOGRAPHY HEAD AND NECK TECHNIQUE: Multidetector CT imaging of the head and neck was performed using the standard protocol during bolus administration of intravenous contrast. Multiplanar CT image reconstructions and MIPs were obtained to evaluate the vascular anatomy. Carotid stenosis measurements (when applicable) are obtained utilizing NASCET criteria, using the distal internal carotid diameter as the denominator. CONTRAST:  532mOMNIPAQUE IOHEXOL 350 MG/ML SOLN COMPARISON:  None. FINDINGS: CTA NECK FINDINGS SKELETON: There is no bony spinal canal stenosis. No lytic or blastic lesion. OTHER NECK: Normal pharynx, larynx and major salivary glands. No cervical lymphadenopathy. Unremarkable thyroid gland. UPPER CHEST: No pneumothorax or pleural  effusion. No nodules or masses. AORTIC ARCH: There is calcific atherosclerosis of the aortic arch. There is no aneurysm, dissection or hemodynamically significant stenosis of the visualized  portion of the aorta. Conventional 3 vessel aortic branching pattern. The visualized proximal subclavian arteries are widely patent. RIGHT CAROTID SYSTEM: Normal without aneurysm, dissection or stenosis. LEFT CAROTID SYSTEM: Normal without aneurysm, dissection or stenosis. VERTEBRAL ARTERIES: Codominant configuration. Both origins are clearly patent. There is no dissection, occlusion or flow-limiting stenosis to the skull base (V1-V3 segments). CTA HEAD FINDINGS POSTERIOR CIRCULATION: --Vertebral arteries: Normal V4 segments. --Inferior cerebellar arteries: Normal. --Basilar artery: Normal. 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  Normal left. --Posterior cerebral arteries (PCA): Normal. ANTERIOR CIRCULATION: --Intracranial internal carotid arteries: Normal. --Anterior cerebral arteries (ACA): Normal. Both A1 segments are present. Patent anterior communicating artery (a-comm). --Middle cerebral arteries (MCA): Normal. VENOUS SINUSES: As permitted by contrast timing, patent. ANATOMIC VARIANTS: None Review of the MIP images confirms the above findings. IMPRESSION: 1. No emergent large vessel occlusion. 2. Proximal occlusion of the right superior cerebellar artery. Aortic Atherosclerosis (ICD10-I70.0). Electronically Signed   By: Ulyses Jarred M.D.   On: 01/20/2021 21:54     Assessment/Plan: Diagnosis: R cerebellar stroke 1. Does the need for close, 24 hr/day medical supervision in concert with the patient's rehab needs make it unreasonable for this patient to be served in a less intensive setting? Yes 2. Co-Morbidities requiring supervision/potential complications: COPD, likely- is smoker; seizures, CAD, OSA, DM, HTN, new stroke  3. Due to bladder management, bowel management, safety, skin/wound care, disease management, medication administration and patient education, does the patient require 24 hr/day rehab nursing? Yes 4. Does the patient require coordinated care of a physician, rehab nurse,  therapy disciplines of PT and OT to address physical and functional deficits in the context of the above medical diagnosis(es)? Yes Addressing deficits in the following areas: balance, endurance, locomotion, strength, transferring, bathing, dressing, feeding, grooming and toileting 5. Can the patient actively participate in an intensive therapy program of at least 3 hrs of therapy per day at least 5 days per week? Yes 6. The potential for patient to make measurable gains while on inpatient rehab is good 7. Anticipated functional outcomes upon discharge from inpatient rehab are modified independent and supervision  with PT, modified independent and supervision with OT, n/a with SLP. 8. Estimated rehab length of stay to reach the above functional goals is: 12-14 days 9. Anticipated discharge destination: Home 10. Overall Rehab/Functional Prognosis: good  RECOMMENDATIONS: This patient's condition is appropriate for continued rehabilitative care in the following setting: CIR Patient has agreed to participate in recommended program. Yes Note that insurance prior authorization may be required for reimbursement for recommended care.  Comment:  1. Pt is a great candidate for inpt rehab/CIR for a cerebellar stroke.  2. She also hasn't had a BM in 3 days- needs to have a BM.  3. Wears a CPAP at home- daughter will try to have it brought it.  4. Will d/w admissions coordinators for admission 5. Thank you for this consult   Bary Leriche, PA-C 01/22/2021

## 2021-01-22 NOTE — Evaluation (Signed)
Speech Language Pathology Evaluation Patient Details Name: Samantha Huffman MRN: [id number] DOB: Apr 09, 1956 Today's Date: 01/22/2021 Time: 9924-2683 SLP Time Calculation (min) (ACUTE ONLY): 22 min  Problem List:  Patient Active Problem List   Diagnosis Date Noted  . Acute cerebrovascular accident (CVA) ([place]) 01/20/2021  . Acute CVA (cerebrovascular accident) ([place]) 01/20/2021  . GERD (gastroesophageal reflux disease)   . Coronary artery disease   . Depression 01/06/2020  . Pneumonia 01/05/2020  . History of chest pain 09/26/2018  . Dyspnea on exertion 09/08/2018  . Chest pain, rule out acute myocardial infarction 10/30/2017  . Edema 01/25/2017  . Tobacco abuse 06/10/2016  . Headache 09/15/2015  . OSA (obstructive sleep apnea) 07/30/2015  . Nephrolithiasis 07/30/2015  . Vasovagal syncope 07/30/2015  . Carotid artery stenosis, asymptomatic 07/30/2015  . CAD (coronary artery disease) 08/22/2013  . Essential hypertension 08/22/2013  . Diabetes mellitus without complication ([place])   . Dyslipidemia   . Seizures ([place])   . 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 Memory deficit 03/22/2013   Past Medical History:  Past Medical History:  Diagnosis Date  . Carotid artery stenosis    1-39% bilateral stenosis by dopplers 08/2020  . Coronary artery disease    a. s/p CABG 10/2012.  cath 05/2016 with moderate LM stenosis, mild LCx and RCA stenosis and occluded LAD with patent LIMA to LAD, free radial to OM.  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  Marland Kitchen Dyslipidemia   . Family history of adverse reaction to anesthesia    mother and sister ponv  . Generalized convulsive epilepsy without mention of intractable epilepsy   . GERD (gastroesophageal reflux disease)   . History of kidney stones   . Hypertension   . Memory deficit    slight  . Migraine    "controlled on daily RX " (06/11/2016)  . Nephrolithiasis 07/30/2015   S/p lithotripsy x 3 and right and left ureter stents  . OSA  (obstructive sleep apnea)    cpap   . PONV (postoperative nausea and vomiting)   . Seizures ([place])    "controlled w/daily RX; started in 2012; dr thinks they might be from the migraines; last sz was early part of 2016" (06/11/2016)  . Type II diabetes mellitus ([facility])    metformin  . Vasovagal syncope 07/30/2015   2011   Past Surgical History:  Past Surgical History:  Procedure Laterality Date  . CARDIAC CATHETERIZATION  2014; 06/11/2016  . CARDIAC CATHETERIZATION N/A 06/11/2016   Procedure: Left Heart Cath and Cors/Grafts Angiography;  Surgeon: Sherren Mocha, MD;  Location: [facility];  Service: Cardiovascular;  Laterality: N/A;  . CESAREAN SECTION    . CORONARY ANGIOPLASTY    . CORONARY ARTERY BYPASS GRAFT  March, 2014   LIMA to LAD, left radial to LCx x 2  . CYSTOSCOPY W/ URETERAL STENT PLACEMENT Left 01/16/2019   Procedure: CYSTOSCOPY WITH RETROGRADE PYELOGRAM/URETERAL LEFT STENT PLACEMENT;  Surgeon: Ardis Hughs, MD;  Location: [facility];  Service: Urology;  Laterality: Left;  . CYSTOSCOPY/URETEROSCOPY/HOLMIUM LASER/STENT PLACEMENT Left 01/24/2019   Procedure: CYSTOSCOPY, LEFT URETEROSCOPY, HOLMIUM LASER, STONE REMOVAL AND STENT EXCHANGE;  Surgeon: Ardis Hughs, MD;  Location: [facility];  Service: Urology;  Laterality: Left;  . LITHOTRIPSY  X 3  . RIGHT/LEFT HEART CATH AND CORONARY/GRAFT ANGIOGRAPHY N/A 09/08/2018   Procedure: RIGHT/LEFT HEART CATH AND CORONARY/GRAFT ANGIOGRAPHY;  Surgeon: Martinique, Peter M, MD;  Location: [facility];  Service: Cardiovascular;  Laterality: N/A;  . TOTAL  ABDOMINAL HYSTERECTOMY     ovaries took before hysterectomy  . TUBAL LIGATION    . URETERAL STENT PLACEMENT     HPI:  Pt is a 65 y.o. F who presents 5/31 with dizziness and weakness. 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 Significant PMH: HTN, CABG, epilepsy, nicotine use.    Assessment / Plan / Recommendation Clinical Impression  Pt quite sleepy during today's assessment; she would awaken and participate eagerly, then drift back to sleep.  Suspect performance will be better when she can participate to full abilities.  Today, she demonstrated impairments in orientation to elements of time, difficulty with tasks requiring sustained attention, and therefore impaired working memory because of inattention.  She had difficulty shifting set and continued to talk about topics we were addressing earlier in the session.  Basic expressive/receptive language appear to be intact.  Initially, pt's speech was mildly dysarthric - clarity improved as session progressed.  Recommend ongoing SLP intervention to address cognition and assess more thoroughly as participation allows.    SLP Assessment  SLP Recommendation/Assessment: Patient needs continued Speech Lanaguage Pathology Services SLP Visit Diagnosis: Cognitive communication deficit (R41.841)    Follow Up Recommendations  Inpatient Rehab    Frequency and Duration min 2x/week  1 week      SLP Evaluation Cognition  Overall Cognitive Status: Impaired/Different from baseline Arousal/Alertness: Awake/alert Orientation Level: Oriented to place;Oriented to situation;Disoriented to time Attention: Sustained Sustained Attention: Impaired Sustained Attention Impairment: Verbal basic Memory: Impaired Memory Impairment: Storage deficit Awareness: Impaired Awareness Impairment: Emergent impairment Problem Solving: Impaired Problem Solving Impairment: Verbal basic       Comprehension  Auditory Comprehension Overall Auditory Comprehension: Appears within functional limits for tasks assessed Reading Comprehension Reading Status: Not tested    Expression Expression Primary Mode of Expression: Verbal Verbal Expression Overall Verbal Expression: Appears within functional limits for tasks assessed   Oral / Motor  Oral  Motor/Sensory Function Overall Oral Motor/Sensory Function: Within functional limits Motor Speech Overall Motor Speech: Appears within functional limits for tasks assessed   GO                   Rishav Rockefeller L. Tivis Ringer, [place] Office number [phone] Pager [phone]  Assunta Curtis 01/22/2021, 9:33 AM

## 2021-01-22 NOTE — Progress Notes (Signed)
PROGRESS NOTE    Samantha Huffman  ZOX:[id number] DOB: [birth year] DOA: 01/20/2021 PCP: Alroy Dust, L.Marlou Sa, MD   No chief complaint on file.  Brief Narrative:  Samantha Huffman is Samantha Huffman 65 y.o. female seen in ed with complaints of  Unsteady gait and right arm and right leg weakness since Sunday. Pt is post BL cataract sx and was lifting water case  over weekend and since then this has started. Pt does reports headache and balance issue and no chest pain sob, or bleeding or abdominal or urinary issue. Pt states that Sunday night she realized the case of water was in the truck so she went to get it and when she put th e case down she has sharp pain on rt side of her neck and she returned to bed and then in am woke up and realized her rt arm and leg was not feeling the same and both her daughter were there to bathe her and feed her but she has been feeling tired and so came to er.  Pt has past medical history of HTN, DM II, Seizure,GERD.  Assessment & Plan:   Principal Problem:   Acute cerebrovascular accident (CVA) ([place]) Active Problems:   Diabetes mellitus without complication ([place])   Dyslipidemia   Seizures ([place])   CAD (coronary artery disease)   Essential hypertension   Carotid artery stenosis, asymptomatic   Tobacco abuse   Acute CVA (cerebrovascular accident) ([place])  Acute ischemic CVA  3 cm acute infarct right superior cerebellum  Right PICA [place] Infarct  Small Acute Infarct L [place]:  CTA head/neck without emergent LVO, proximal occlusion of right superior cerebellar artery MRA head/neck with mild atherosclerotic disease in carotid bulb bilateral without significant stenosis, mild narroiwing proximal right vertebral artery (2/2 atherosclerotic disease vs dissection - recommended consider CT angio head/neck).  Vertebral arteries patent to the basilar, R PICA not visualized.  Small L PICA.  R superior cerebral artery occluded proximally compatible with infarct.   MRI brain with 3 cm acute  infarct right superior cerebellum, smaller acute infarct right PICA territory, small acute infarct left thalamus  Aspirin 81 daily, Lipitor 40 LDL 57, HDL 42 Permissive hypertension  Neurology c/s, appreciate recs -> suspected etiology vertebral artery dissection after lifting/straining.  Right SCA large infarct with slight 4th ventricle compression.  Needs repeat CT 6/3 to r/o hydrocephalus.  Aspirin 325 and plavix 75 mg x3 months, then back to aspirin 81 and plavix 75 mg DAPT home dose.   PT, OT, SLP.  Aspiration precautions.  Pending inpatient rehab eval. Echo with EF 81-19%, grade I diastolic dysfunction   J4NW Sliding scale insulin Follow A1c 7.1 Accu-Cheks and hypoglycemia protocol.  Dyslipidemia: Continue lipitor   Seizures: seizure precautions. Jodi Marble continued.   CAD:  Continue patient on aspirin and statin therapy. Blocker therapy held for permissive hypertension for the next 24 to 48 hours.    Hypertension: As needed labetalol for systolic blood pressure of 220 and above. Home regimen of Coreg 12.5, Lasix 20 mg, Imdur 120 mg all held for permissive hypertension.    Carotid artery stenosis:  Imaging notable for mild atherosclerotic dz in carotid bulb bilaterally without significant stenosis   Tobacco abuse: Counseling once patient is stable.  Concern for ability to afford meds, toc c/s - she notes she wasn't taking all of her meds prior to admission (including reported DAPT)  DVT prophylaxis: SCD Code Status: full  Family Communication: none at bedside Disposition:   Status is: Inpatient  Remains  inpatient appropriate because:Inpatient level of care appropriate due to severity of illness   Dispo: The patient is from: Home              Anticipated d/c is to: pending              Patient currently is not medically stable to d/c.   Difficult to place patient No       Consultants:   neurology  Procedures: Echo IMPRESSIONS    1. Left  ventricular ejection fraction, by estimation, is 60 to 65%. The  left ventricle has normal function. The left ventricle has no regional  wall motion abnormalities. There is mild left ventricular hypertrophy.  Left ventricular diastolic parameters  are consistent with Grade I diastolic dysfunction (impaired relaxation).  2. Right ventricular systolic function is normal. The right ventricular  size is normal. Tricuspid regurgitation signal is inadequate for assessing  PA pressure.  3. The mitral valve is grossly normal. Trivial mitral valve  regurgitation. No evidence of mitral stenosis.  4. The aortic valve is tricuspid. Aortic valve regurgitation is not  visualized. No aortic stenosis is present.  5. The inferior vena cava is normal in size with greater than 50%  respiratory variability, suggesting right atrial pressure of 3 mmHg.   Conclusion(s)/Recommendation(s): No intracardiac source of embolism  detected on this transthoracic study. Anna-Marie Coller transesophageal echocardiogram is  recommended to exclude cardiac source of embolism if clinically indicated.  Antimicrobials:  Anti-infectives (From admission, onward)   None     Subjective: Says R side of face feels like it's drooping, r side feels weak   Objective: Vitals:   01/21/21 1832 01/21/21 2000 01/21/21 2356 01/22/21 0353  BP: (!) 151/71 (!) 150/73 (!) 170/77 (!) 180/86  Pulse: 75 78 74 73  Resp: 16 18 15 19   Temp: 98.9 F (37.2 C) 98.7 F (37.1 C) 98.9 F (37.2 C) 99.1 F (37.3 C)  TempSrc: Oral Oral Oral Oral  SpO2: 96% 100% 100% 100%   No intake or output data in the 24 hours ending 01/22/21 0908 There were no vitals filed for this visit.  Examination:  General: No acute distress. Cardiovascular: Heart sounds show Soma Bachand regular rate, and rhythm.  Lungs: Clear to auscultation bilaterally  Abdomen: Soft, nontender, nondistended. Neurological: Alert and oriented 3. Moves all extremities 4.  Relatively symmetric  strength.  R sided facial droop.  RUE FNF dysmetria.  Skin: Warm and dry. No rashes or lesions. Extremities: No clubbing or cyanosis. No edema.    Data Reviewed: I have personally reviewed following labs and imaging studies  CBC: Recent Labs  Lab 01/20/21 1240 01/20/21 1248 01/22/21 0253  WBC 8.1  --  8.4  NEUTROABS 4.7  --  4.3  HGB 16.5* 16.7* 14.8  HCT 49.3* 49.0* 43.8  MCV 95.4  --  94.8  PLT 220  --  062    Basic Metabolic Panel: Recent Labs  Lab 01/20/21 1240 01/20/21 1248 01/21/21 0508 01/22/21 0253  NA 137 140 138 138  K 4.2 4.3 3.9 4.1  CL 105 106 108 106  CO2 23  --  23 23  GLUCOSE 164* 164* 127* 139*  BUN 14 16 14 20   CREATININE 0.98 0.90 0.98 1.13*  CALCIUM 10.1  --  9.6 9.5  MG  --   --   --  2.0  PHOS  --   --   --  4.7*    GFR: CrCl cannot be calculated (Unknown ideal weight.).  Liver Function Tests: Recent Labs  Lab 01/20/21 1240 01/21/21 0508 01/22/21 0253  AST 21 22 20   ALT 17 19 18   ALKPHOS 127* 114 106  BILITOT 0.8 0.9 0.6  PROT 8.1 7.1 7.0  ALBUMIN 4.6 4.0 3.9    CBG: Recent Labs  Lab 01/21/21 0745 01/21/21 1146 01/21/21 1602 01/21/21 2102 01/22/21 0611  GLUCAP 138* 144* 147* 115* 148*     Recent Results (from the past 240 hour(s))  Resp Panel by RT-PCR (Flu Monifa Blanchette&B, Covid) Nasopharyngeal Swab     Status: None   Collection Time: 01/20/21  3:52 PM   Specimen: Nasopharyngeal Swab; Nasopharyngeal(NP) swabs in vial transport medium  Result Value Ref Range Status   SARS Coronavirus 2 by RT PCR NEGATIVE NEGATIVE Final    Comment: (NOTE) SARS-CoV-2 target nucleic acids are NOT DETECTED.  The SARS-CoV-2 RNA is generally detectable in upper respiratory specimens during the acute phase of infection. The lowest concentration of SARS-CoV-2 viral copies this assay can detect is 138 copies/mL. Rosslyn Pasion negative result does not preclude SARS-Cov-2 infection and should not be used as the sole basis for treatment or other patient management  decisions. Josie Mesa negative result may occur with  improper specimen collection/handling, submission of specimen other than nasopharyngeal swab, presence of viral mutation(s) within the areas targeted by this assay, and inadequate number of viral copies(<138 copies/mL). Korin Setzler negative result must be combined with clinical observations, patient history, and epidemiological information. The expected result is Negative.  Fact Sheet for Patients:  EntrepreneurPulse.com.au  Fact Sheet for Healthcare Providers:  IncredibleEmployment.be  This test is no t yet approved or cleared by the [place] FDA and  has been authorized for detection and/or diagnosis of SARS-CoV-2 by FDA under an Emergency Use Authorization (EUA). This EUA will remain  in effect (meaning this test can be used) for the duration of the COVID-19 declaration under Section 564(b)(1) of the Act, 21 U.S.C.section 360bbb-3(b)(1), unless the authorization is terminated  or revoked sooner.       Influenza Ortha Metts by PCR NEGATIVE NEGATIVE Final   Influenza B by PCR NEGATIVE NEGATIVE Final    Comment: (NOTE) The Xpert Xpress SARS-CoV-2/FLU/RSV plus assay is intended as an aid in the diagnosis of influenza from Nasopharyngeal swab specimens and should not be used as Blu Mcglaun sole basis for treatment. Nasal washings and aspirates are unacceptable for Xpert Xpress SARS-CoV-2/FLU/RSV testing.  Fact Sheet for Patients: EntrepreneurPulse.com.au  Fact Sheet for Healthcare Providers: IncredibleEmployment.be  This test is not yet approved or cleared by the [place] FDA and has been authorized for detection and/or diagnosis of SARS-CoV-2 by FDA under an Emergency Use Authorization (EUA). This EUA will remain in effect (meaning this test can be used) for the duration of the COVID-19 declaration under Section 564(b)(1) of the Act, 21 U.S.C. section 360bbb-3(b)(1), unless the  authorization is terminated or revoked.  Performed at [facility], [place] [address], [place], [place] [address]          Radiology Studies: CT HEAD WO CONTRAST  Result Date: 01/20/2021 CLINICAL DATA:  Unsteady gait and leg weakness. EXAM: CT HEAD WITHOUT CONTRAST TECHNIQUE: Contiguous axial images were obtained from the base of the skull through the vertex without intravenous contrast. COMPARISON:  None. FINDINGS: Brain: There is Aairah Negrette 4 cm infarct in the superior aspect of the right cerebellar hemisphere consistent with an acute to subacute appearance. Cytotoxic edema results in mild local mass effect. There is also likely Alyne Martinson separate subcentimeter subacute infarct more  posteriorly in the right cerebellar hemisphere. No intracranial hemorrhage, midline shift, or extra-axial fluid collection is identified. The ventricles are normal in size. Vascular: Calcified atherosclerosis at the skull base. Skull: No fracture or suspicious osseous lesion. Sinuses/Orbits: Minimal bilateral ethmoid air cell mucosal thickening. Clear mastoid air cells. Bilateral cataract extraction. Other: None. 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 Electronically Signed   By: Logan Bores M.D.   On: 01/20/2021 13:43   MR ANGIO HEAD WO CONTRAST  Result Date: 01/20/2021 CLINICAL DATA:  Stroke EXAM: MRA HEAD WITHOUT CONTRAST TECHNIQUE: Angiographic images of the Circle of Willis were acquired using MRA technique without intravenous contrast. COMPARISON:  MRI head 01/20/2021 FINDINGS: Anterior circulation: Internal carotid artery widely patent bilaterally. Anterior and middle cerebral arteries patent bilaterally without stenosis or large vessel occlusion. Posterior circulation: Both vertebral arteries patent to the basilar without stenosis. Right PICA not visualized. Very small left PICA Ca. Left AICA patent. Right AICA not visualized. Right superior cerebral artery occluded proximally. Left superior cerebellar  artery patent. Posterior cerebral arteries patent bilaterally. Anatomic variants: None Other: Negative for cerebral aneurysm. IMPRESSION: Both vertebral arteries patent to the basilar. Right PICA not visualized. Small left PICA. Right superior cerebral artery occluded proximally compatible with acute infarct. Electronically Signed   By: Franchot Gallo M.D.   On: 01/20/2021 20:10   MR Angiogram Neck W or Wo Contrast  Result Date: 01/20/2021 CLINICAL DATA:  Stroke EXAM: MRA NECK WITHOUT AND WITH CONTRAST TECHNIQUE: Multiplanar and multiecho pulse sequences of the neck were obtained without and with intravenous contrast. Angiographic images of the neck were obtained using MRA technique without and with intravenous contrast. CONTRAST:  27mL GADAVIST GADOBUTROL 1 MMOL/ML IV SOLN COMPARISON:  MRI head 01/20/2021 FINDINGS: Antegrade flow in the carotid and vertebral arteries bilaterally Mild atherosclerotic disease in the carotid bulb bilaterally without flow limiting stenosis. Remainder of the internal carotid artery is widely patent through the cavernous segment. Left vertebral artery is widely patent to the basilar without stenosis Mild diffuse narrowing of the proximal right vertebral artery. Remainder of the right vertebral artery is patent to the basilar. IMPRESSION: Mild atherosclerotic disease in the carotid bowel bulb bilaterally without significant stenosis Mild narrowing proximal right vertebral artery. This may be due to atherosclerotic disease or dissection. Given the posterior fossa infarcts, consider CT angio head and neck for further evaluation. Electronically Signed   By: Franchot Gallo M.D.   On: 01/20/2021 20:07   MR BRAIN WO CONTRAST  Result Date: 01/20/2021 CLINICAL DATA:  Acute neuro deficit.  Stroke. EXAM: MRI HEAD WITHOUT CONTRAST TECHNIQUE: Multiplanar, multiecho pulse sequences of the brain and surrounding structures were obtained without intravenous contrast. COMPARISON:  CT head  01/20/2021 FINDINGS: Brain: 3 cm acute infarct in the right superior cerebellar artery territory. Additional small area of acute infarct in the right PICA territory. Small acute infarct in the left thalamus. Ventricle size is normal. Negative for hemorrhage or mass. Few small white matter hyperintensities bilaterally likely due to chronic microvascular ischemia. Vascular: Normal arterial flow voids. Skull and upper cervical spine: Negative Sinuses/Orbits: Paranasal sinuses clear. Bilateral cataract extraction Other: None IMPRESSION: 3 cm acute infarct right superior cerebellum. Smaller acute infarct right PICA territory. Small acute infarct left thalamus. Electronically Signed   By: Franchot Gallo M.D.   On: 01/20/2021 20:02   ECHOCARDIOGRAM COMPLETE  Result Date: 01/21/2021    ECHOCARDIOGRAM REPORT   Patient Name:   Samantha Huffman Date of Exam: 01/21/2021 Medical Rec #:  [id number]  Height:       61.0 in Accession #:    [id number]   Weight:       143.0 lb Date of Birth:  02/14/1956    BSA:          1.638 m Patient Age:    39 years     BP:           156/99 mmHg Patient Gender: F            HR:           66 bpm. Exam Location:  Inpatient Procedure: 2D Echo, Cardiac Doppler and Color Doppler Indications:    Stroke I63.9  History:        Patient has prior history of Echocardiogram examinations, most                 recent 08/07/2018. CAD, Carotid Disease; Risk                 Factors:Hypertension, Diabetes, Dyslipidemia, Sleep Apnea and                 GERD. Seizures.  Sonographer:    Jonelle Sidle Dance Referring Phys: [id number] EKTA V PATEL IMPRESSIONS  1. Left ventricular ejection fraction, by estimation, is 60 to 65%. The left ventricle has normal function. The left ventricle has no regional wall motion abnormalities. There is mild left ventricular hypertrophy. Left ventricular diastolic parameters are consistent with Grade I diastolic dysfunction (impaired relaxation).  2. Right ventricular systolic function is normal. The  right ventricular size is normal. Tricuspid regurgitation signal is inadequate for assessing PA pressure.  3. The mitral valve is grossly normal. Trivial mitral valve regurgitation. No evidence of mitral stenosis.  4. The aortic valve is tricuspid. Aortic valve regurgitation is not visualized. No aortic stenosis is present.  5. The inferior vena cava is normal in size with greater than 50% respiratory variability, suggesting right atrial pressure of 3 mmHg. Conclusion(s)/Recommendation(s): No intracardiac source of embolism detected on this transthoracic study. Ayleen Mckinstry transesophageal echocardiogram is recommended to exclude cardiac source of embolism if clinically indicated. FINDINGS  Left Ventricle: Left ventricular ejection fraction, by estimation, is 60 to 65%. The left ventricle has normal function. The left ventricle has no regional wall motion abnormalities. The left ventricular internal cavity size was normal in size. There is  mild left ventricular hypertrophy. Left ventricular diastolic parameters are consistent with Grade I diastolic dysfunction (impaired relaxation). Right Ventricle: The right ventricular size is normal. No increase in right ventricular wall thickness. Right ventricular systolic function is normal. Tricuspid regurgitation signal is inadequate for assessing PA pressure. Left Atrium: Left atrial size was normal in size. Right Atrium: Right atrial size was normal in size. Pericardium: Trivial pericardial effusion is present. Mitral Valve: The mitral valve is grossly normal. Mild mitral annular calcification. Trivial mitral valve regurgitation. No evidence of mitral valve stenosis. Tricuspid Valve: The tricuspid valve is normal in structure. Tricuspid valve regurgitation is not demonstrated. No evidence of tricuspid stenosis. Aortic Valve: The aortic valve is tricuspid. Aortic valve regurgitation is not visualized. No aortic stenosis is present. Pulmonic Valve: The pulmonic valve was normal in  structure. Pulmonic valve regurgitation is trivial. No evidence of pulmonic stenosis. Aorta: The aortic root is normal in size and structure. Venous: The inferior vena cava is normal in size with greater than 50% respiratory variability, suggesting right atrial pressure of 3 mmHg. IAS/Shunts: No atrial level shunt detected by color flow Doppler.  LEFT VENTRICLE PLAX  2D LVIDd:         4.00 cm  Diastology LVIDs:         3.00 cm  LV e' medial:    5.77 cm/s LV PW:         1.20 cm  LV E/e' medial:  8.8 LV IVS:        1.10 cm  LV e' lateral:   8.16 cm/s LVOT diam:     1.80 cm  LV E/e' lateral: 6.2 LV SV:         40 LV SV Index:   25 LVOT Area:     2.54 cm  RIGHT VENTRICLE            IVC RV Basal diam:  2.30 cm    IVC diam: 1.10 cm RV S prime:     8.59 cm/s TAPSE (M-mode): 1.4 cm LEFT ATRIUM             Index       RIGHT ATRIUM           Index LA diam:        2.80 cm 1.71 cm/m  RA Area:     10.80 cm LA Vol (A2C):   42.3 ml 25.83 ml/m RA Volume:   21.80 ml  13.31 ml/m LA Vol (A4C):   37.9 ml 23.14 ml/m LA Biplane Vol: 40.7 ml 24.85 ml/m  AORTIC VALVE LVOT Vmax:   71.70 cm/s LVOT Vmean:  48.700 cm/s LVOT VTI:    0.159 m  AORTA Ao Root diam: 3.00 cm Ao Asc diam:  2.70 cm MITRAL VALVE MV Area (PHT): 2.32 cm    SHUNTS MV Decel Time: 327 msec    Systemic VTI:  0.16 m MV E velocity: 50.60 cm/s  Systemic Diam: 1.80 cm MV Annesha Delgreco velocity: 69.40 cm/s MV E/Carleah Yablonski ratio:  0.73 Cherlynn Kaiser MD Electronically signed by Cherlynn Kaiser MD Signature Date/Time: 01/21/2021/10:46:15 AM    Final    CT ANGIO HEAD CODE STROKE  Result Date: 01/20/2021 CLINICAL DATA:  Focal neuro deficit, > 6 hrs, stroke suspected EXAM: CT ANGIOGRAPHY HEAD AND NECK TECHNIQUE: Multidetector CT imaging of the head and neck was performed using the standard protocol during bolus administration of intravenous contrast. Multiplanar CT image reconstructions and MIPs were obtained to evaluate the vascular anatomy. Carotid stenosis measurements (when applicable) are  obtained utilizing NASCET criteria, using the distal internal carotid diameter as the denominator. CONTRAST:  17mL OMNIPAQUE IOHEXOL 350 MG/ML SOLN COMPARISON:  None. FINDINGS: CTA NECK FINDINGS SKELETON: There is no bony spinal canal stenosis. No lytic or blastic lesion. OTHER NECK: Normal pharynx, larynx and major salivary glands. No cervical lymphadenopathy. Unremarkable thyroid gland. UPPER CHEST: No pneumothorax or pleural effusion. No nodules or masses. AORTIC ARCH: There is calcific atherosclerosis of the aortic arch. There is no aneurysm, dissection or hemodynamically significant stenosis of the visualized portion of the aorta. Conventional 3 vessel aortic branching pattern. The visualized proximal subclavian arteries are widely patent. RIGHT CAROTID SYSTEM: Normal without aneurysm, dissection or stenosis. LEFT CAROTID SYSTEM: Normal without aneurysm, dissection or stenosis. VERTEBRAL ARTERIES: Codominant configuration. Both origins are clearly patent. There is no dissection, occlusion or flow-limiting stenosis to the skull base (V1-V3 segments). CTA HEAD FINDINGS POSTERIOR CIRCULATION: --Vertebral arteries: Normal V4 segments. --Inferior cerebellar arteries: Normal. --Basilar artery: Normal. 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  Normal left. --Posterior cerebral arteries (PCA): Normal. ANTERIOR CIRCULATION: --Intracranial internal carotid arteries: Normal. --Anterior cerebral arteries (ACA): Normal. Both A1 segments  are present. Patent anterior communicating artery (Younes Degeorge-comm). --Middle cerebral arteries (MCA): Normal. VENOUS SINUSES: As permitted by contrast timing, patent. ANATOMIC VARIANTS: None Review of the MIP images confirms the above findings. IMPRESSION: 1. No emergent large vessel occlusion. 2. Proximal occlusion of the right superior cerebellar artery. Aortic Atherosclerosis (ICD10-I70.0). Electronically Signed   By: Ulyses Jarred M.D.   On: 01/20/2021 21:54   CT ANGIO NECK CODE  STROKE  Result Date: 01/20/2021 CLINICAL DATA:  Focal neuro deficit, > 6 hrs, stroke suspected EXAM: CT ANGIOGRAPHY HEAD AND NECK TECHNIQUE: Multidetector CT imaging of the head and neck was performed using the standard protocol during bolus administration of intravenous contrast. Multiplanar CT image reconstructions and MIPs were obtained to evaluate the vascular anatomy. Carotid stenosis measurements (when applicable) are obtained utilizing NASCET criteria, using the distal internal carotid diameter as the denominator. CONTRAST:  73mL OMNIPAQUE IOHEXOL 350 MG/ML SOLN COMPARISON:  None. FINDINGS: CTA NECK FINDINGS SKELETON: There is no bony spinal canal stenosis. No lytic or blastic lesion. OTHER NECK: Normal pharynx, larynx and major salivary glands. No cervical lymphadenopathy. Unremarkable thyroid gland. UPPER CHEST: No pneumothorax or pleural effusion. No nodules or masses. AORTIC ARCH: There is calcific atherosclerosis of the aortic arch. There is no aneurysm, dissection or hemodynamically significant stenosis of the visualized portion of the aorta. Conventional 3 vessel aortic branching pattern. The visualized proximal subclavian arteries are widely patent. RIGHT CAROTID SYSTEM: Normal without aneurysm, dissection or stenosis. LEFT CAROTID SYSTEM: Normal without aneurysm, dissection or stenosis. VERTEBRAL ARTERIES: Codominant configuration. Both origins are clearly patent. There is no dissection, occlusion or flow-limiting stenosis to the skull base (V1-V3 segments). CTA HEAD FINDINGS POSTERIOR CIRCULATION: --Vertebral arteries: Normal V4 segments. --Inferior cerebellar arteries: Normal. --Basilar artery: Normal. 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  Normal left. --Posterior cerebral arteries (PCA): Normal. ANTERIOR CIRCULATION: --Intracranial internal carotid arteries: Normal. --Anterior cerebral arteries (ACA): Normal. Both A1 segments are present. Patent anterior communicating artery  (Saidee Geremia-comm). --Middle cerebral arteries (MCA): Normal. VENOUS SINUSES: As permitted by contrast timing, patent. ANATOMIC VARIANTS: None Review of the MIP images confirms the above findings. IMPRESSION: 1. No emergent large vessel occlusion. 2. Proximal occlusion of the right superior cerebellar artery. Aortic Atherosclerosis (ICD10-I70.0). Electronically Signed   By: Ulyses Jarred M.D.   On: 01/20/2021 21:54        Scheduled Meds: .  stroke: mapping our early stages of recovery book   Does not apply Once  .  stroke: mapping our early stages of recovery book   Does not apply Once  . aspirin EC  325 mg Oral Daily  . atorvastatin  80 mg Oral Daily  . clopidogrel  75 mg Oral Daily  . escitalopram  10 mg Oral QHS  . ezetimibe  10 mg Oral QHS  . insulin aspart  0-15 Units Subcutaneous TID WC  . levETIRAcetam  500 mg Oral BID  . pantoprazole  40 mg Oral Daily  . topiramate  75 mg Oral BID   Continuous Infusions:   LOS: 2 days    Time spent: over 30 min    Fayrene Helper, MD [facility]   To contact the attending provider between 7A-7P or the covering provider during after hours 7P-7A, please log into the web site [email or web address] and access using universal  password for that web site. If you do not have the password, please call the hospital operator.  01/22/2021, 9:08 AM

## 2021-01-23 ENCOUNTER — Inpatient Hospital Stay (HOSPITAL_COMMUNITY): Payer: Medicare HMO

## 2021-01-23 LAB — CBC WITH DIFFERENTIAL/PLATELET
Abs Immature Granulocytes: 0.03 10*3/uL (ref 0.00–0.07)
Basophils Absolute: 0 10*3/uL (ref 0.0–0.1)
Basophils Relative: 1 %
Eosinophils Absolute: 0.2 10*3/uL (ref 0.0–0.5)
Eosinophils Relative: 2 %
HCT: 41.5 % (ref 36.0–46.0)
Hemoglobin: 13.8 g/dL (ref 12.0–15.0)
Immature Granulocytes: 0 %
Lymphocytes Relative: 36 %
Lymphs Abs: 2.9 10*3/uL (ref 0.7–4.0)
MCH: 31.8 pg (ref 26.0–34.0)
MCHC: 33.3 g/dL (ref 30.0–36.0)
MCV: 95.6 fL (ref 80.0–100.0)
Monocytes Absolute: 0.6 10*3/uL (ref 0.1–1.0)
Monocytes Relative: 8 %
Neutro Abs: 4.3 10*3/uL (ref 1.7–7.7)
Neutrophils Relative %: 53 %
Platelets: 188 10*3/uL (ref 150–400)
RBC: 4.34 MIL/uL (ref 3.87–5.11)
RDW: 12.9 % (ref 11.5–15.5)
WBC: 8.2 10*3/uL (ref 4.0–10.5)
nRBC: 0 % (ref 0.0–0.2)

## 2021-01-23 LAB — COMPREHENSIVE METABOLIC PANEL
ALT: 18 U/L (ref 0–44)
AST: 22 U/L (ref 15–41)
Albumin: 3.8 g/dL (ref 3.5–5.0)
Alkaline Phosphatase: 118 U/L (ref 38–126)
Anion gap: 8 (ref 5–15)
BUN: 20 mg/dL (ref 8–23)
CO2: 21 mmol/L — ABNORMAL LOW (ref 22–32)
Calcium: 9.4 mg/dL (ref 8.9–10.3)
Chloride: 108 mmol/L (ref 98–111)
Creatinine, Ser: 1.2 mg/dL — ABNORMAL HIGH (ref 0.44–1.00)
GFR, Estimated: 51 mL/min — ABNORMAL LOW (ref >60)
Glucose, Bld: 149 mg/dL — ABNORMAL HIGH (ref 70–99)
Potassium: 3.9 mmol/L (ref 3.5–5.1)
Sodium: 137 mmol/L (ref 135–145)
Total Bilirubin: 0.5 mg/dL (ref 0.3–1.2)
Total Protein: 6.5 g/dL (ref 6.5–8.1)

## 2021-01-23 LAB — GLUCOSE, CAPILLARY
Glucose-Capillary: 148 mg/dL — ABNORMAL HIGH (ref 70–99)
Glucose-Capillary: 133 mg/dL — ABNORMAL HIGH (ref 70–99)
Glucose-Capillary: 117 mg/dL — ABNORMAL HIGH (ref 70–99)
Glucose-Capillary: 156 mg/dL — ABNORMAL HIGH (ref 70–99)

## 2021-01-23 LAB — PHOSPHORUS: Phosphorus: 4.4 mg/dL (ref 2.5–4.6)

## 2021-01-23 LAB — MAGNESIUM: Magnesium: 1.9 mg/dL (ref 1.7–2.4)

## 2021-01-23 IMAGING — CT CT HEAD W/O CM
4 series · 16 of 47 positions shown, 18 images · non-contrast
Comparison: CTA head and neck, MRI and MRA [DATE] and earlier.

CLINICAL DATA: 64-year-old female with neurologic deficit, right
superior cerebellar artery occlusion and infarct.

EXAM:
CT HEAD WITHOUT CONTRAST
TECHNIQUE: Contiguous axial images were obtained from the base of the skull
through the vertex without intravenous contrast.

[Series 3: head without · axial · non-contrast · 0.39mm/px · z∈[-157,-42]mm · 7 of 31 slices shown, 9 images]
[im 4/31  brain]
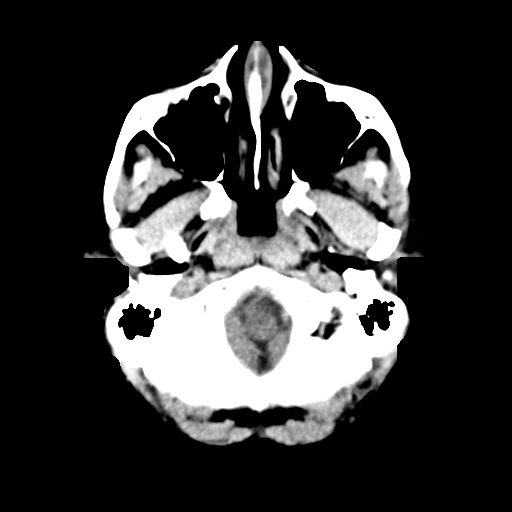
[im 4/31  bone]
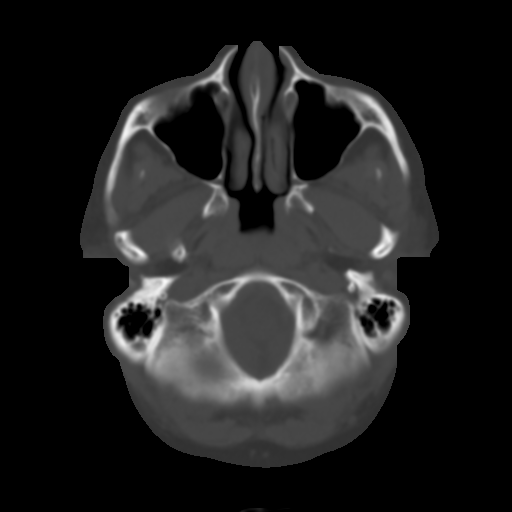
[im 8/31  brain]
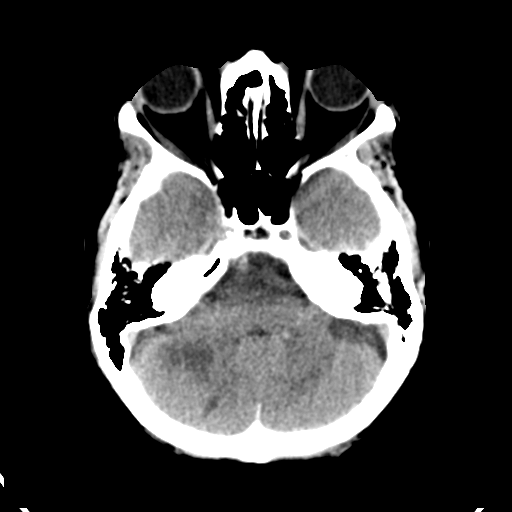
[im 12/31  brain]
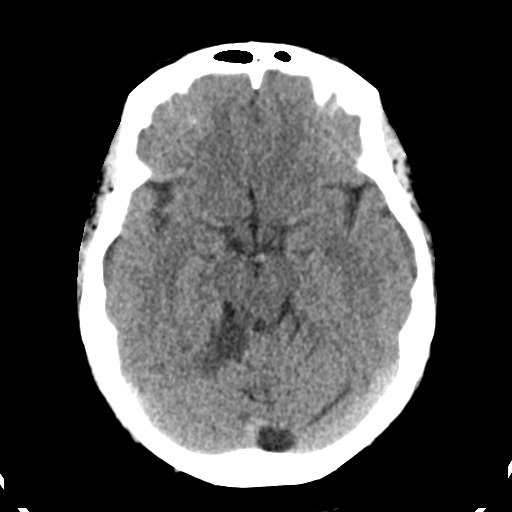
[im 16/31  brain]
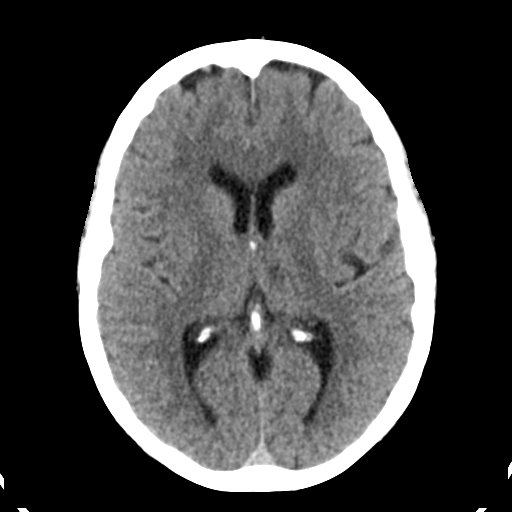
[im 19/31  brain]
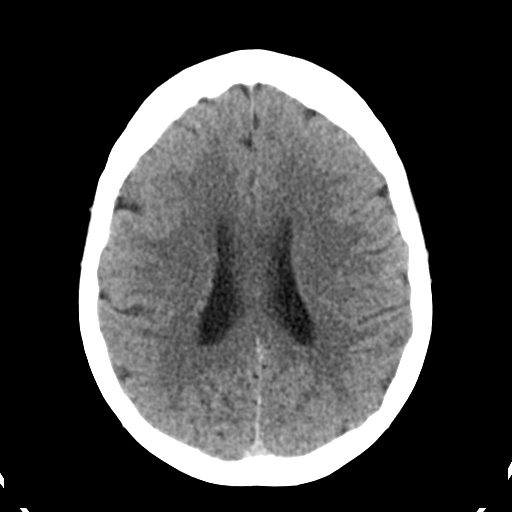
[im 19/31  bone]
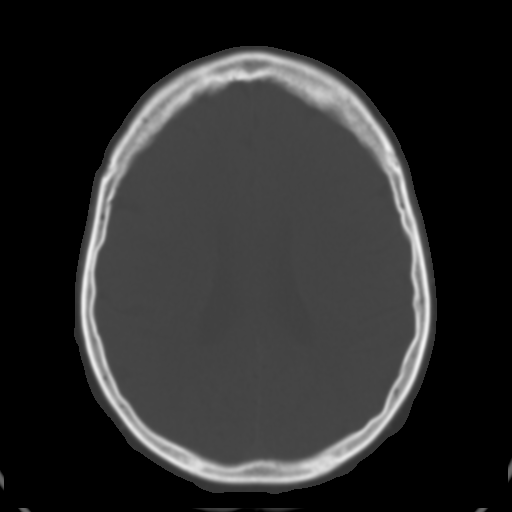
[im 23/31  brain]
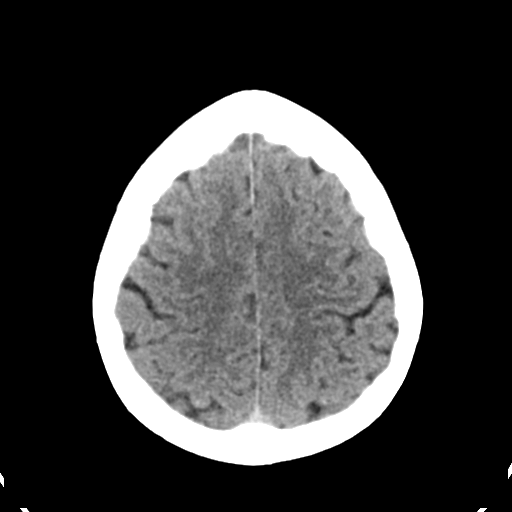
[im 27/31  brain]
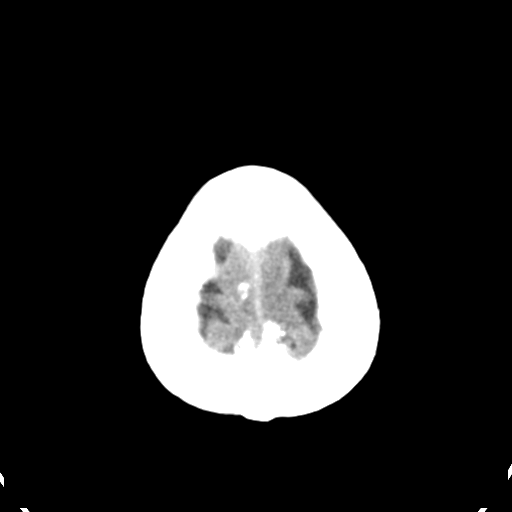

[Series 4: head bone · axial · 0.39mm/px · z∈[-158,-128]mm · 3 of 77 slices shown]
[im 8/77  bone]
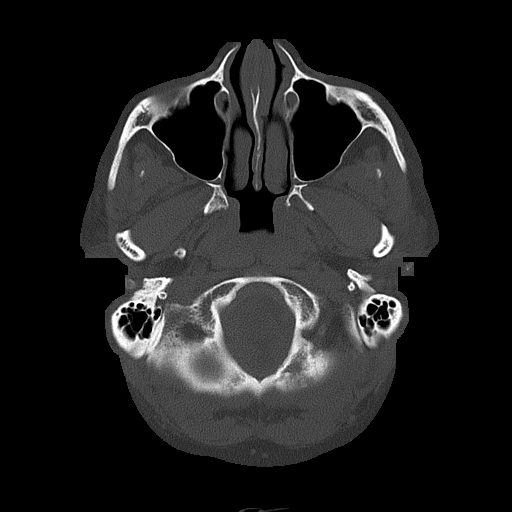
[im 16/77  bone]
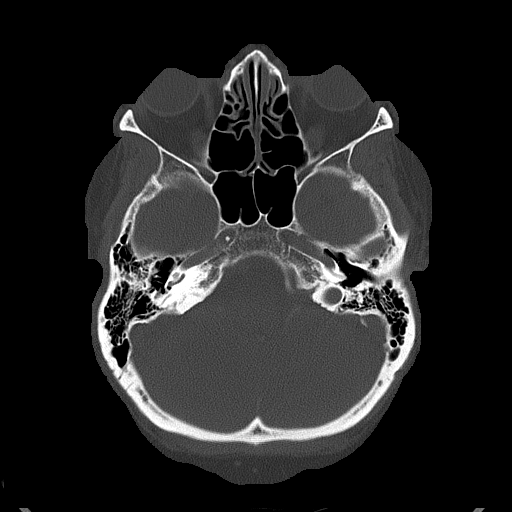
[im 23/77  bone]
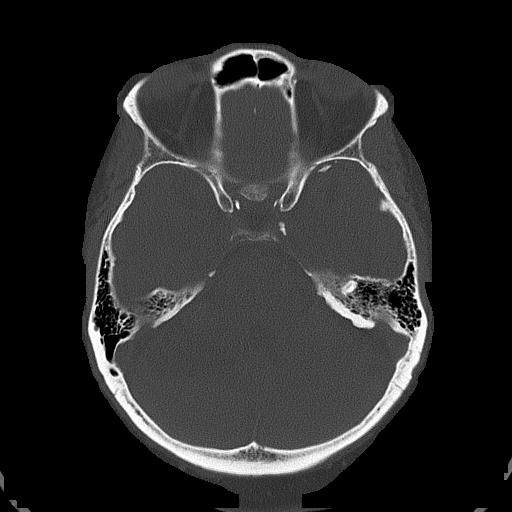

[Series 5: head without cor · coronal · non-contrast · 0.30mm/px · 3 of 67 slices shown]
[im 23/67  brain]
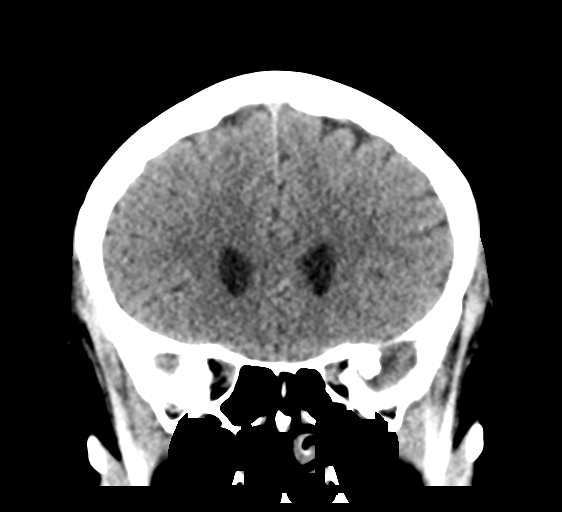
[im 30/67  brain]
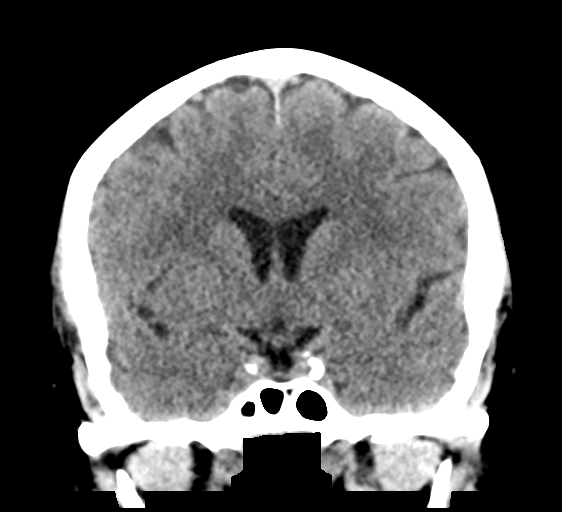
[im 37/67  brain]
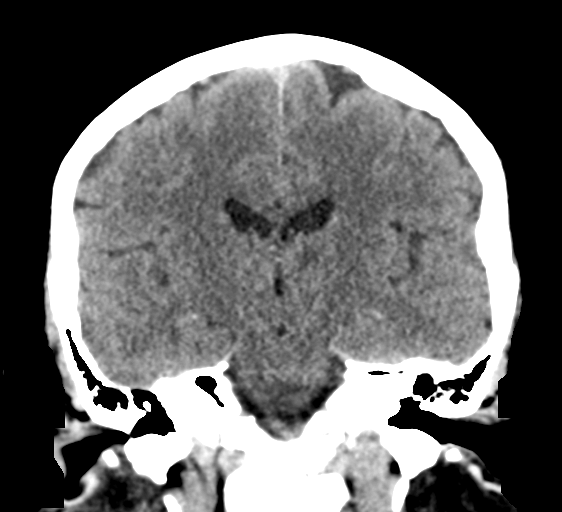

[Series 6: head without sag · sagittal · non-contrast · 0.30mm/px · 3 of 58 slices shown]
[im 20/58  brain]
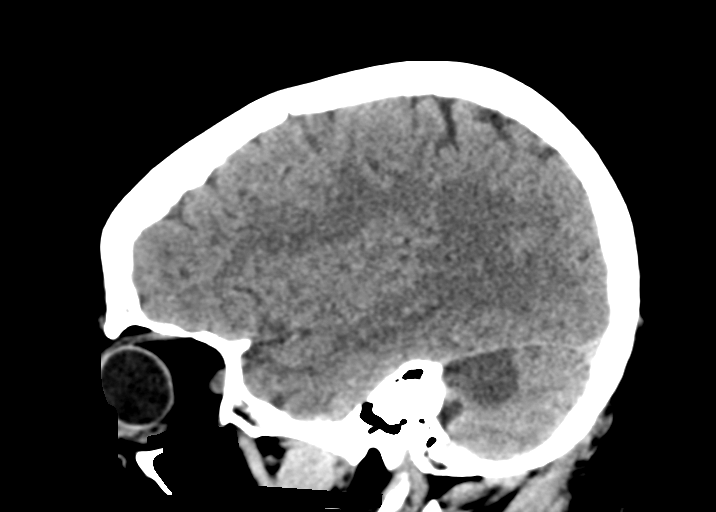
[im 29/58  brain]
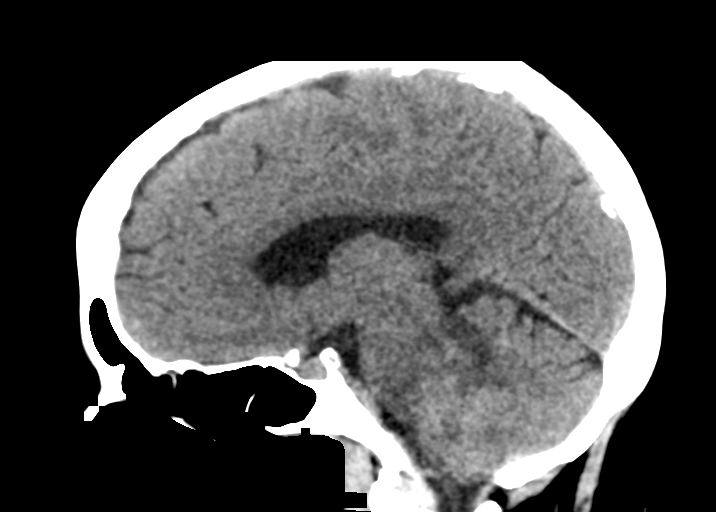
[im 39/58  brain]
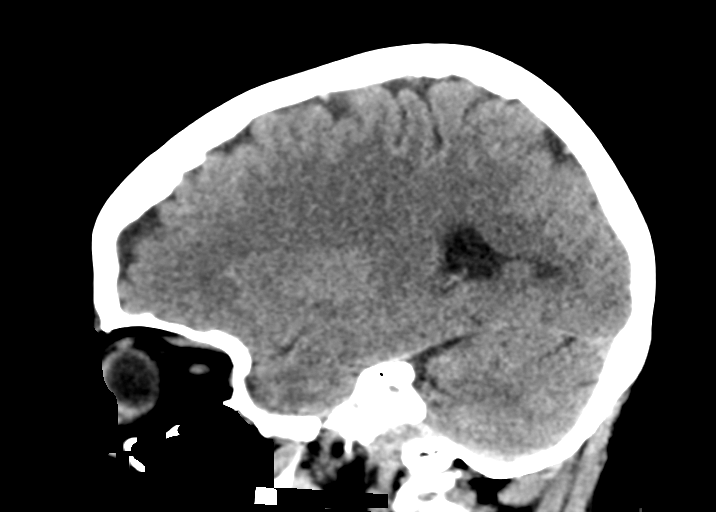

[16 of 47 positions shown; findings below may reference images not displayed]

FINDINGS: Brain: Confluent cytotoxic edema in the right cerebellum SCA
territory has not significantly changed from the CT on [DATE].
Superimposed small linear right PICA territory infarcts also stable.

No significant posterior fossa mass effect.

Recent left thalamic lacunar type infarct appears stable.

No acute intracranial hemorrhage identified. No intracranial mass
effect. No ventriculomegaly. Stable gray-white matter
differentiation elsewhere.

Vascular: Calcified atherosclerosis at the skull base.

Skull: No acute osseous abnormality identified.

Sinuses/Orbits: Visualized paranasal sinuses and mastoids are stable
and well aerated.

Other: No acute orbit or scalp soft tissue finding.
IMPRESSION: 1. Stable appearance of the the recent Right SCA, small Right PICA,
and left thalamic lacunar infarcts. No associated hemorrhage or mass
effect.
2. No new intracranial abnormality.

## 2021-01-23 MED ORDER — LACTATED RINGERS IV SOLN: INTRAVENOUS | Status: AC

## 2021-01-23 MED ORDER — POLYETHYLENE GLYCOL 3350 17 G PO PACK
17.0000 g | PACK | Freq: Two times a day (BID) | ORAL | Status: DC
  Administered 2021-01-23 – 2021-01-29 (×11): 17 g via ORAL
  Filled 2021-01-23 (×14): qty 1

## 2021-01-23 MED ORDER — CARVEDILOL 3.125 MG PO TABS
3.1250 mg | ORAL_TABLET | Freq: Two times a day (BID) | ORAL | Status: DC
  Administered 2021-01-23 – 2021-01-30 (×15): 3.125 mg via ORAL
  Filled 2021-01-23 (×15): qty 1

## 2021-01-23 NOTE — Progress Notes (Addendum)
Occupational Therapy Treatment Patient Details Name: Samantha Huffman MRN: [id number] DOB: 12/12/1955 Today's Date: 01/23/2021    History of present illness Pt is a 65 y.o. F who presents 5/31 with dizziness and weakness. 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 Significant PMH: HTN, CABG, epilepsy, nicotine use.   OT comments  Pt. Seen for skilled OT treatment session.  Min guard for bed mobility.  While seated eob stood without assistance or staff with her. (rn was present but checking meds at the computer and I was at the door).  Pt. Sat abruptly once rn and myself were alerted to her movement without [place] near by.  Provided education and review with pt. As to why this was unsafe. She verbalized understanding.  Mod a for pivotal steps to recliner for simulated toileting task.  No buckling of R LE noted this day.  Pt. Remains eager and willing for therapeutic participation.  Will benefit from continued therapies CIR level to optimize cognitive strategies and safety with mobilty and adls prior to home.    Follow Up Recommendations  CIR    Equipment Recommendations  3 in 1 bedside commode    Recommendations for Other Services Rehab consult    Precautions / Restrictions Precautions Precautions: Fall Precaution Comments: ataxia, RLE Restrictions Weight Bearing Restrictions: No       Mobility Bed Mobility Overal bed mobility: Needs Assistance Bed Mobility: Supine to Sit     Supine to sit: Min guard Sit to supine: Min guard;HOB elevated        Transfers Overall transfer level: Needs assistance Equipment used: Rolling walker (2 wheeled) Transfers: Sit to/from [facility] Sit to Stand: Min assist Stand pivot transfers: Mod assist       General transfer comment: cues for sequencing and safety.  was able to explain having both legs touch the surface of the chair and  reaching for arm rests without cues.    Balance                                           ADL either performed or assessed with clinical judgement   ADL Overall ADL's : Needs assistance/impaired                     Lower Body Dressing: Minimal assistance;Moderate assistance;Sitting/lateral leans;Cueing for safety   Toilet Transfer: Moderate assistance;Stand-pivot;RW Toilet Transfer Details (indicate cue type and reason): cues for each step and rw management simulated with eob to recliner         Functional mobility during ADLs: Moderate assistance;Rolling walker;Cueing for safety;Cueing for sequencing General ADL Comments: pt. with noted impulsivity at beginning of session.  was seated eob with rn in room preparing meds.  i had walked to door to answer a question from a PT who was at the door.  as i turned to walk back pt. had stood up without warning and abruptly "plopped" down on bed RN immediately checked on pt. as her back had been turned to check in meds on computer.  i rushed over to pt. and reviewed with pt. reasons that she needed someone to be with her during any transfers/sit to stand.  pt. states "you just think you can stand and walk and forget that you cant".  i provided emotional support and resumed the  tx. session.  during sit/stand with therapist asst. and stand pivot to recliner approx. 4 steps, no buckling noted of RLE.     Vision       Perception     Praxis      Cognition Arousal/Alertness: Awake/alert Behavior During Therapy: Flat affect Overall Cognitive Status: Impaired/Different from baseline Area of Impairment: Awareness;Problem solving                         Safety/Judgement: Decreased awareness of safety;Decreased awareness of deficits Awareness: Emergent Problem Solving: Slow processing          Exercises     Shoulder Instructions       General Comments      Pertinent Vitals/ Pain       Pain  Assessment: No/denies pain  Home Living                                          Prior Functioning/Environment              Frequency  Min 2X/week        Progress Toward Goals  OT Goals(current goals can now be found in the care plan section)  Progress towards OT goals: Progressing toward goals     Plan Discharge plan remains appropriate    Co-evaluation                 AM-PAC OT "6 Clicks" Daily Activity     Outcome Measure   Help from another person eating meals?: A Little Help from another person taking care of personal grooming?: A Little Help from another person toileting, which includes using toliet, bedpan, or urinal?: A Lot Help from another person bathing (including washing, rinsing, drying)?: A Lot Help from another person to put on and taking off regular upper body clothing?: A Little Help from another person to put on and taking off regular lower body clothing?: A Lot 6 Click Score: 15    End of Session Equipment Utilized During Treatment: Gait belt;Rolling walker  OT Visit Diagnosis: Unsteadiness on feet (R26.81);Muscle weakness (generalized) (M62.81);Other symptoms and signs involving cognitive function   Activity Tolerance Patient tolerated treatment well   Patient Left in chair;with call bell/phone within reach;with chair alarm set   Nurse Communication Other (comment) (rn present when pt. stood without assistance. chair alarm set at end of session and reviewed with staff it was on.)        Time: 0908-0926 OT Time Calculation (min): 18 min  Charges: OT General Charges $OT Visit: 1 Visit OT Treatments $Self Care/Home Management : 8-22 mins  Sonia Baller, COTA/L Acute Rehabilitation [phone]   Clearnce Sorrel  01/23/2021, 11:32 AM

## 2021-01-23 NOTE — Progress Notes (Signed)
PROGRESS NOTE    Samantha Huffman  WJX:[id number] DOB: Nov 23, 1955 DOA: 01/20/2021 PCP: Alroy Dust, L.Marlou Sa, MD   No chief complaint on file.  Brief Narrative:  Samantha Huffman is Samantha Huffman 65 y.o. female seen in ed with complaints of  Unsteady gait and right arm and right leg weakness since Sunday. Pt is post BL cataract sx and was lifting water case  over weekend and since then this has started. Pt does reports headache and balance issue and no chest pain sob, or bleeding or abdominal or urinary issue. Pt states that Sunday night she realized the case of water was in the truck so she went to get it and when she put th e case down she has sharp pain on rt side of her neck and she returned to bed and then in am woke up and realized her rt arm and leg was not feeling the same and both her daughter were there to bathe her and feed her but she has been feeling tired and so came to er.  Pt has past medical history of HTN, DM II, Seizure,GERD.  Assessment & Plan:   Principal Problem:   Acute cerebrovascular accident (CVA) ([place]) Active Problems:   Diabetes mellitus without complication ([place])   Dyslipidemia   Seizures ([place])   CAD (coronary artery disease)   Essential hypertension   Carotid artery stenosis, asymptomatic   Tobacco abuse   Acute CVA (cerebrovascular accident) ([place])  Acute ischemic CVA  3 cm acute infarct right superior cerebellum  Right PICA Territory Infarct  Small Acute Infarct L [place]:  CTA head/neck without emergent LVO, proximal occlusion of right superior cerebellar artery MRA head/neck with mild atherosclerotic disease in carotid bulb bilateral without significant stenosis, mild narroiwing proximal right vertebral artery (2/2 atherosclerotic disease vs dissection - recommended consider CT angio head/neck).  Vertebral arteries patent to the basilar, R PICA not visualized.  Small L PICA.  R superior cerebral artery occluded proximally compatible with infarct.   MRI brain with 3 cm acute  infarct right superior cerebellum, smaller acute infarct right PICA territory, small acute infarct left thalamus  Aspirin 81 daily, Lipitor 80 LDL 57, HDL 42 A1c 7.1 Permissive hypertension  Repeat head CT 6/3 with stable appearance of recent R SCA, small R PICA, and L thalamic lacunar infarcts - no associated hemorrhage or mass effect Neurology c/s, appreciate recs -> possible vertebral artery dissection?  Right SCA large infarct with slight 4th ventricle compression.  Needs repeat CT 6/3 to r/o hydrocephalus.  Aspirin 325 and plavix 75 mg x3 months, then back to aspirin 81 and plavix 75 mg DAPT home dose.   PT, OT, SLP.  Aspiration precautions.  PM&R recommended CIR, awaiting insurance approval.  Echo with EF 21-30%, grade I diastolic dysfunction   Acute Kidney Injury Creatinine rising, will follow UA IVF  T2DM Sliding scale insulin Follow A1c 7.1 Accu-Cheks and hypoglycemia protocol.  Dyslipidemia: Continue lipitor   Seizures: seizure precautions. Samantha Huffman continued.   CAD:  Continue patient on aspirin and statin therapy. Will resume coreg, low dose  Hypertension: As needed labetalol for systolic blood pressure of 220 and above. Home regimen of Coreg 12.5, Lasix 20 mg, Imdur 120 mg all held for permissive hypertension.  Resume coreg at lower dose today - doesn't appear she was taking BP meds due to cost   Carotid artery stenosis:  Imaging notable for mild atherosclerotic dz in carotid bulb bilaterally without significant stenosis   Tobacco abuse: Counseling once patient is stable.  Concern for ability to afford meds, toc c/s - she notes she wasn't taking all of her meds prior to admission (including reported DAPT)  DVT prophylaxis: SCD Code Status: full  Family Communication: none at bedside Disposition:   Status is: Inpatient  Remains inpatient appropriate because:Inpatient level of care appropriate due to severity of illness   Dispo: The patient is from:  Home              Anticipated d/c is to: pending              Patient currently is not medically stable to d/c.   Difficult to place patient No       Consultants:   neurology  Procedures: Echo IMPRESSIONS    1. Left ventricular ejection fraction, by estimation, is 60 to 65%. The  left ventricle has normal function. The left ventricle has no regional  wall motion abnormalities. There is mild left ventricular hypertrophy.  Left ventricular diastolic parameters  are consistent with Grade I diastolic dysfunction (impaired relaxation).  2. Right ventricular systolic function is normal. The right ventricular  size is normal. Tricuspid regurgitation signal is inadequate for assessing  PA pressure.  3. The mitral valve is grossly normal. Trivial mitral valve  regurgitation. No evidence of mitral stenosis.  4. The aortic valve is tricuspid. Aortic valve regurgitation is not  visualized. No aortic stenosis is present.  5. The inferior vena cava is normal in size with greater than 50%  respiratory variability, suggesting right atrial pressure of 3 mmHg.   Conclusion(s)/Recommendation(s): No intracardiac source of embolism  detected on this transthoracic study. Samantha Huffman transesophageal echocardiogram is  recommended to exclude cardiac source of embolism if clinically indicated.  Antimicrobials:  Anti-infectives (From admission, onward)   None     Subjective: Sleepy, no complaints   Objective: Vitals:   01/22/21 2018 01/22/21 2326 01/23/21 0349 01/23/21 0809  BP: (!) 182/71 (!) 164/74 (!) 152/79 (!) 187/93  Pulse: 97 83 88 79  Resp: 16 18 18 18   Temp: 98.9 F (37.2 C) 100.1 F (37.8 C) 99.3 F (37.4 C) 98.5 F (36.9 C)  TempSrc: Oral Oral Oral Oral  SpO2: 99% 99% 99% 94%    Intake/Output Summary (Last 24 hours) at 01/23/2021 9326 Last data filed at 01/23/2021 0355 Gross per 24 hour  Intake 200 ml  Output 200 ml  Net 0 ml   There were no vitals filed for this  visit.  Examination:  General: No acute distress. Cardiovascular: Heart sounds show Samantha Huffman regular rate, and rhythm.  Lungs: Clear to auscultation bilaterally  Abdomen: Soft, nontender, nondistended Neurological: Alert and oriented 3. Moves all extremities 4 with relatively symmetric strength. RUE FNF dysmetria. R facial droop. Skin: Warm and dry. No rashes or lesions. Extremities: No clubbing or cyanosis. No edema   Data Reviewed: I have personally reviewed following labs and imaging studies  CBC: Recent Labs  Lab 01/20/21 1240 01/20/21 1248 01/22/21 0253 01/23/21 0341  WBC 8.1  --  8.4 8.2  NEUTROABS 4.7  --  4.3 4.3  HGB 16.5* 16.7* 14.8 13.8  HCT 49.3* 49.0* 43.8 41.5  MCV 95.4  --  94.8 95.6  PLT 220  --  183 712    Basic Metabolic Panel: Recent Labs  Lab 01/20/21 1240 01/20/21 1248 01/21/21 0508 01/22/21 0253 01/23/21 0341  NA 137 140 138 138 137  K 4.2 4.3 3.9 4.1 3.9  CL 105 106 108 106 108  CO2 23  --  23 23 21*  GLUCOSE 164* 164* 127* 139* 149*  BUN 14 16 14 20 20   CREATININE 0.98 0.90 0.98 1.13* 1.20*  CALCIUM 10.1  --  9.6 9.5 9.4  MG  --   --   --  2.0 1.9  PHOS  --   --   --  4.7* 4.4    GFR: CrCl cannot be calculated (Unknown ideal weight.).  Liver Function Tests: Recent Labs  Lab 01/20/21 1240 01/21/21 0508 01/22/21 0253 01/23/21 0341  AST 21 22 20 22   ALT 17 19 18 18   ALKPHOS 127* 114 106 118  BILITOT 0.8 0.9 0.6 0.5  PROT 8.1 7.1 7.0 6.5  ALBUMIN 4.6 4.0 3.9 3.8    CBG: Recent Labs  Lab 01/22/21 0611 01/22/21 1045 01/22/21 1557 01/22/21 2103 01/23/21 0614  GLUCAP 148* 121* 156* 226* 148*     Recent Results (from the past 240 hour(s))  Resp Panel by RT-PCR (Flu Samantha Huffman&B, Covid) Nasopharyngeal Swab     Status: None   Collection Time: 01/20/21  3:52 PM   Specimen: Nasopharyngeal Swab; Nasopharyngeal(NP) swabs in vial transport medium  Result Value Ref Range Status   SARS Coronavirus 2 by RT PCR NEGATIVE NEGATIVE Final     Comment: (NOTE) SARS-CoV-2 target nucleic acids are NOT DETECTED.  The SARS-CoV-2 RNA is generally detectable in upper respiratory specimens during the acute phase of infection. The lowest concentration of SARS-CoV-2 viral copies this assay can detect is 138 copies/mL. Samantha Huffman negative result does not preclude SARS-Cov-2 infection and should not be used as the sole basis for treatment or other patient management decisions. Samantha Huffman negative result may occur with  improper specimen collection/handling, submission of specimen other than nasopharyngeal swab, presence of viral mutation(s) within the areas targeted by this assay, and inadequate number of viral copies(<138 copies/mL). Samantha Huffman negative result must be combined with clinical observations, patient history, and epidemiological information. The expected result is Negative.  Fact Sheet for Patients:  EntrepreneurPulse.com.au  Fact Sheet for Healthcare Providers:  IncredibleEmployment.be  This test is no t yet approved or cleared by the [place] FDA and  has been authorized for detection and/or diagnosis of SARS-CoV-2 by FDA under an Emergency Use Authorization (EUA). This EUA will remain  in effect (meaning this test can be used) for the duration of the COVID-19 declaration under Section 564(b)(1) of the Act, 21 U.S.C.section 360bbb-3(b)(1), unless the authorization is terminated  or revoked sooner.       Influenza Hiilei Gerst by PCR NEGATIVE NEGATIVE Final   Influenza B by PCR NEGATIVE NEGATIVE Final    Comment: (NOTE) The Xpert Xpress SARS-CoV-2/FLU/RSV plus assay is intended as an aid in the diagnosis of influenza from Nasopharyngeal swab specimens and should not be used as Daijha Leggio sole basis for treatment. Nasal washings and aspirates are unacceptable for Xpert Xpress SARS-CoV-2/FLU/RSV testing.  Fact Sheet for Patients: EntrepreneurPulse.com.au  Fact Sheet for Healthcare  Providers: IncredibleEmployment.be  This test is not yet approved or cleared by the [place] FDA and has been authorized for detection and/or diagnosis of SARS-CoV-2 by FDA under an Emergency Use Authorization (EUA). This EUA will remain in effect (meaning this test can be used) for the duration of the COVID-19 declaration under Section 564(b)(1) of the Act, 21 U.S.C. section 360bbb-3(b)(1), unless the authorization is terminated or revoked.  Performed at [facility], [place] [address] [place], [place] [address]          Radiology Studies: CT HEAD WO CONTRAST  Result Date: 01/23/2021 CLINICAL DATA:  65 year old female with neurologic deficit, right superior cerebellar artery occlusion and infarct. EXAM: CT HEAD WITHOUT CONTRAST TECHNIQUE: Contiguous axial images were obtained from the base of the skull through the vertex without intravenous contrast. COMPARISON:  CTA head and neck, MRI and MRA 01/20/2021 and earlier. FINDINGS: Brain: Confluent cytotoxic edema in the right cerebellum SCA territory has not significantly changed from the CT on 01/20/2021. Superimposed small linear right PICA territory infarcts also stable. No significant posterior fossa mass effect. Recent left thalamic lacunar type infarct appears stable. No acute intracranial hemorrhage identified. No intracranial mass effect. No ventriculomegaly. Stable gray-white matter differentiation elsewhere. Vascular: Calcified atherosclerosis at the skull base. Skull: No acute osseous abnormality identified. Sinuses/Orbits: Visualized paranasal sinuses and mastoids are stable and well aerated. Other: No acute orbit or scalp soft tissue finding. IMPRESSION: 1. Stable appearance of the the recent Right SCA, small Right PICA, and left thalamic lacunar infarcts. No associated hemorrhage or mass effect. 2. No new intracranial abnormality. Electronically Signed   By: Genevie Ann M.D.   On: 01/23/2021 06:46    ECHOCARDIOGRAM COMPLETE  Result Date: 01/21/2021    ECHOCARDIOGRAM REPORT   Patient Name:   Samantha Huffman Date of Exam: 01/21/2021 Medical Rec #:  [id number]    Height:       61.0 in Accession #:    [id number]   Weight:       143.0 lb Date of Birth:  07-07-1956    BSA:          1.638 m Patient Age:    34 years     BP:           156/99 mmHg Patient Gender: F            HR:           66 bpm. Exam Location:  Inpatient Procedure: 2D Echo, Cardiac Doppler and Color Doppler Indications:    Stroke I63.9  History:        Patient has prior history of Echocardiogram examinations, most                 recent 08/07/2018. CAD, Carotid Disease; Risk                 Factors:Hypertension, Diabetes, Dyslipidemia, Sleep Apnea and                 GERD. Seizures.  Sonographer:    Jonelle Sidle Dance Referring Phys: [id number] EKTA V PATEL IMPRESSIONS  1. Left ventricular ejection fraction, by estimation, is 60 to 65%. The left ventricle has normal function. The left ventricle has no regional wall motion abnormalities. There is mild left ventricular hypertrophy. Left ventricular diastolic parameters are consistent with Grade I diastolic dysfunction (impaired relaxation).  2. Right ventricular systolic function is normal. The right ventricular size is normal. Tricuspid regurgitation signal is inadequate for assessing PA pressure.  3. The mitral valve is grossly normal. Trivial mitral valve regurgitation. No evidence of mitral stenosis.  4. The aortic valve is tricuspid. Aortic valve regurgitation is not visualized. No aortic stenosis is present.  5. The inferior vena cava is normal in size with greater than 50% respiratory variability, suggesting right atrial pressure of 3 mmHg. Conclusion(s)/Recommendation(s): No intracardiac source of embolism detected on this transthoracic study. Corisa Montini transesophageal echocardiogram is recommended to exclude cardiac source of embolism if clinically indicated. FINDINGS  Left Ventricle: Left ventricular ejection  fraction, by estimation, is 60 to 65%. The left ventricle  has normal function. The left ventricle has no regional wall motion abnormalities. The left ventricular internal cavity size was normal in size. There is  mild left ventricular hypertrophy. Left ventricular diastolic parameters are consistent with Grade I diastolic dysfunction (impaired relaxation). Right Ventricle: The right ventricular size is normal. No increase in right ventricular wall thickness. Right ventricular systolic function is normal. Tricuspid regurgitation signal is inadequate for assessing PA pressure. Left Atrium: Left atrial size was normal in size. Right Atrium: Right atrial size was normal in size. Pericardium: Trivial pericardial effusion is present. Mitral Valve: The mitral valve is grossly normal. Mild mitral annular calcification. Trivial mitral valve regurgitation. No evidence of mitral valve stenosis. Tricuspid Valve: The tricuspid valve is normal in structure. Tricuspid valve regurgitation is not demonstrated. No evidence of tricuspid stenosis. Aortic Valve: The aortic valve is tricuspid. Aortic valve regurgitation is not visualized. No aortic stenosis is present. Pulmonic Valve: The pulmonic valve was normal in structure. Pulmonic valve regurgitation is trivial. No evidence of pulmonic stenosis. Aorta: The aortic root is normal in size and structure. Venous: The inferior vena cava is normal in size with greater than 50% respiratory variability, suggesting right atrial pressure of 3 mmHg. IAS/Shunts: No atrial level shunt detected by color flow Doppler.  LEFT VENTRICLE PLAX 2D LVIDd:         4.00 cm  Diastology LVIDs:         3.00 cm  LV e' medial:    5.77 cm/s LV PW:         1.20 cm  LV E/e' medial:  8.8 LV IVS:        1.10 cm  LV e' lateral:   8.16 cm/s LVOT diam:     1.80 cm  LV E/e' lateral: 6.2 LV SV:         40 LV SV Index:   25 LVOT Area:     2.54 cm  RIGHT VENTRICLE            IVC RV Basal diam:  2.30 cm    IVC diam: 1.10  cm RV S prime:     8.59 cm/s TAPSE (M-mode): 1.4 cm LEFT ATRIUM             Index       RIGHT ATRIUM           Index LA diam:        2.80 cm 1.71 cm/m  RA Area:     10.80 cm LA Vol (A2C):   42.3 ml 25.83 ml/m RA Volume:   21.80 ml  13.31 ml/m LA Vol (A4C):   37.9 ml 23.14 ml/m LA Biplane Vol: 40.7 ml 24.85 ml/m  AORTIC VALVE LVOT Vmax:   71.70 cm/s LVOT Vmean:  48.700 cm/s LVOT VTI:    0.159 m  AORTA Ao Root diam: 3.00 cm Ao Asc diam:  2.70 cm MITRAL VALVE MV Area (PHT): 2.32 cm    SHUNTS MV Decel Time: 327 msec    Systemic VTI:  0.16 m MV E velocity: 50.60 cm/s  Systemic Diam: 1.80 cm MV Caiya Bettes velocity: 69.40 cm/s MV E/Damica Gravlin ratio:  0.73 Cherlynn Kaiser MD Electronically signed by Cherlynn Kaiser MD Signature Date/Time: 01/21/2021/10:46:15 AM    Final         Scheduled Meds: .  stroke: mapping our early stages of recovery book   Does not apply Once  .  stroke: mapping our early stages of recovery book   Does not apply Once  .  aspirin EC  325 mg Oral Daily  . atorvastatin  80 mg Oral Daily  . clopidogrel  75 mg Oral Daily  . escitalopram  10 mg Oral QHS  . ezetimibe  10 mg Oral QHS  . insulin aspart  0-15 Units Subcutaneous TID WC  . levETIRAcetam  500 mg Oral BID  . pantoprazole  40 mg Oral Daily  . topiramate  75 mg Oral BID   Continuous Infusions:   LOS: 3 days    Time spent: over 30 min    Fayrene Helper, MD [facility]   To contact the attending provider between 7A-7P or the covering provider during after hours 7P-7A, please log into the web site [email or web address] and access using universal [place] password for that web site. If you do not have the password, please call the hospital operator.  01/23/2021, 9:24 AM

## 2021-01-23 NOTE — Progress Notes (Signed)
Inpatient Rehabilitation Admissions Coordinator  I met at bedside with patient and her daughter, [name]. We discussed goals and expectations of a possible CIR admit. I have begun insurance Auth with [facility] for a possible CIR admit pending insurance approval next week. I will follow up on Monday.  Danne Baxter, RN, MSN Rehab Admissions Coordinator [phone] 01/23/2021 1:32 PM

## 2021-01-23 NOTE — Progress Notes (Addendum)
STROKE TEAM PROGRESS NOTE   INTERVAL HISTORY No acute events. Head CT stable this morning.  Still hypertensive 449-675 systolic, stable. Daughter at bedside talking on cell phone.  Neuro exam improved today as she is reporting diminished sensation has resolved at present. Her coordination issues with right upper and lower extremity persist. She denies any new complaints or concerns.  We discussed neurologic recovery and plan for CIR. Questions answered.   Vitals:   01/22/21 2018 01/22/21 2326 01/23/21 0349 01/23/21 0809  BP: (!) 182/71 (!) 164/74 (!) 152/79 (!) 187/93  Pulse: 97 83 88 79  Resp: 16 18 18 18   Temp: 98.9 F (37.2 C) 100.1 F (37.8 C) 99.3 F (37.4 C) 98.5 F (36.9 C)  TempSrc: Oral Oral Oral Oral  SpO2: 99% 99% 99% 94%   CBC:  Recent Labs  Lab 01/22/21 0253 01/23/21 0341  WBC 8.4 8.2  NEUTROABS 4.3 4.3  HGB 14.8 13.8  HCT 43.8 41.5  MCV 94.8 95.6  PLT 183 916   Basic Metabolic Panel:  Recent Labs  Lab 01/22/21 0253 01/23/21 0341  NA 138 137  K 4.1 3.9  CL 106 108  CO2 23 21*  GLUCOSE 139* 149*  BUN 20 20  CREATININE 1.13* 1.20*  CALCIUM 9.5 9.4  MG 2.0 1.9  PHOS 4.7* 4.4   Lipid Panel:  Recent Labs  Lab 01/21/21 0508  CHOL 123  TRIG 118  HDL 42  CHOLHDL 2.9  VLDL 24  LDLCALC 57   HgbA1c:  Recent Labs  Lab 01/21/21 0508  HGBA1C 7.1*   Urine Drug Screen: No results for input(s): LABOPIA, COCAINSCRNUR, LABBENZ, AMPHETMU, THCU, LABBARB in the last 168 hours.  Alcohol Level No results for input(s): ETH in the last 168 hours.  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 EXAM: CT HEAD WITHOUT CONTRAST TECHNIQUE: Contiguous axial images were obtained from the base of the skull through the vertex without intravenous contrast. COMPARISON:  CTA head and neck, MRI and MRA 01/20/2021 and earlier. FINDINGS:  Brain: Confluent cytotoxic edema in the right cerebellum SCA territory has not significantly changed from the CT on 01/20/2021. Superimposed small linear right PICA territory infarcts also stable. No significant posterior fossa mass effect. Recent left thalamic lacunar type infarct appears stable. No acute intracranial hemorrhage identified. No intracranial mass effect. No ventriculomegaly. Stable gray-white matter differentiation elsewhere. Vascular: Calcified atherosclerosis at the skull base. Skull: No acute osseous abnormality identified. Sinuses/Orbits: Visualized paranasal sinuses and mastoids are stable and well aerated. Other: No acute orbit or scalp soft tissue finding. IMPRESSION: 1. Stable appearance of the the recent Right SCA, small Right PICA, and left thalamic lacunar infarcts. No associated hemorrhage or mass effect. 2. No new intracranial abnormality. Electronically Signed   By: Genevie Ann M.D.   On: 01/23/2021 06:46   PHYSICAL EXAM General: Sitting up in chair at bedside eating breakfast.  Constitutional: Appears well-developed and well-nourished.  Psych: Mildly anxious and frustrated regarding stroke deficit Head: Normocephalic.  Cardiovascular: Normal rate and regular rhythm.  Respiratory: Effort normal, non-labored breathing  Skin: Warm and dry  Neuro: Mental Status: Patient is fully awake, alert, oriented to time, place, person  Patient is able to give a clear and coherent history. No signs of aphasia or neglect. Follows 1-2 step commands.   Cranial Nerves: II: Visual Fields are full. Pupils are equal, round, and reactive to  light.   III,IV, VI: EOMI without ptosis or diploplia.  V: Facial sensation is symmetric to temperature VII: Facial movement is symmetric.  VIII: hearing is intact to voice X: Uvula elevates symmetrically XI: Shoulder shrug is symmetric. XII: tongue is midline without atrophy or fasciculations.  Motor: Tone is normal. Bulk is normal. 5/5 strength  was present in all four extremities.  Sensory: Sensation is symmetric to light touch and temperature in the arms and legs. Plantars: Toes are downgoing bilaterally.  Cerebellar: Ataxia in right upper extremity and right lower extremity with finger-to-nose and heel-to-shin  ASSESSMENT/PLAN 65 old female with PMH significant for seizure, CAD, OSA, DM2, Migraines, HLD, HTN, current cigarette smoking presented with sudden onset of dizziness and weakness after lifting a container of water bottles that started on 01/19/2021 at around [place].  Not a candidate for tPA and thrombectomy as patient outside window.   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 Smaller acute infarct right PICA territory. Small acute infarct left thalamus. Etiology unclear. Possible vertebral dissection.   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 Smaller acute infarct right PICA territory. Small acute infarct left thalamus.  MRA : Acute to subacute right cerebellar infarct.  CTA Head and Neck: No emergent large vessel occlusion. Proximal occlusion of the right superior cerebellar artery.  Repeat HCT 6/3 stable infarct and no hydrocephalus  Stat CT if any neuro changes to rule out hydrocephalus   2D Echo: EF 60-65%. Mild LVH.  Grade I diastolic dysfunction. No shunt, thrombus or wall motion abnormalities.   LDL 57  HgbA1c 7.6  VTE prophylaxis - is recommended   On ASA 81mg  and Plavix prior to admission  Now on ASA 325mg  and Plavix 75 mg daily. Recommend aspirin 325 and Plavix 75 DAPT for 3 months and then back to aspirin 81 and Plavix 75 DAPT home dose.   Therapy recommendations:  TBD  Disposition:  Pending  Hypertension  Stable, remains hypertensive  . Permissive hypertension (OK if < 220/120) but gradually normalize in 5-7 days . Long-term BP goal normotensive  Hyperlipidemia  Home meds:  lipitor 80   LDL 57, goal <  70  High intensity statin home medication of Lipitor 80mg  on board  Continue statin at discharge  Diabetes type II Uncontrolled  HgbA1c 7.6, higher than goal < 7.0  CBGs  SSI  Management per primary team  Seizure disorder  Continue Keppra  Other Stroke Risk Factors  Current Cigarette smoker, advised to stop smoking  Current ETOH use, alcohol level  advised to drink no more than 1drink a day  Family hx stroke (Brother)  Coronary artery disease s/p CABG 2014 on DAPT  Migraines  Obstructive sleep apnea  Other Active Problems  Hospital day # 3  Delila A Bailey-Modzik, NP-C  ATTENDING NOTE: I reviewed above note and agree with the assessment and plan. Pt was seen and examined.   Granddaughter at bedside. Pt sitting in chair for lunch. No acute event overnight. Neuro stable. CT repeat showed stable infarct without hydrocephalus. PT/OT recommend CIR, pending CIR now. Continue ASA 325 and plavix for 3 months and then ASA 81 and plavix later. Continue statin and zetia. Risk factor modification. Quit smoking. Stat CT if any neuro changes to rule out hydrocephalus  For detailed assessment and plan, please refer to above as I have made changes wherever appropriate.   Neurology will sign off. Please call with questions. Pt will  follow up with Dr. Jannifer Franklin at [facility] in about 4 weeks. Thanks for the consult.  Rosalin Hawking, MD PhD Stroke Neurology 01/23/2021 12:07 PM       To contact Stroke Continuity provider, please refer to [email or web address] After hours, contact General Neurology

## 2021-01-23 NOTE — Progress Notes (Signed)
Physical Therapy Treatment Patient Details Name: Samantha Huffman MRN: [id number] DOB: 09-27-1955 Today's Date: 01/23/2021    History of Present Illness Pt is a 65 y.o. F who presents 5/31 with dizziness and weakness. 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 Significant PMH: HTN, CABG, epilepsy, nicotine use.    PT Comments    Pt continues to make progress towards her goals, however is limited in safe mobility today by impulsivity, decreased awareness of deficits and progressing R LE weakness with fatigue. Pt is min guard for bed mobility, min A for transfers and minA progressing to modA for increasing R LE weakness and instability with gait. D/c plans remain appropriate at this time. PT will continue to follow acutely.     Follow Up Recommendations  CIR     Equipment Recommendations  Rolling walker with 5" wheels;3in1 (PT)       Precautions / Restrictions Precautions Precautions: Fall Precaution Comments: ataxia, RLE Restrictions Weight Bearing Restrictions: No    Mobility  Bed Mobility Overal bed mobility: Needs Assistance Bed Mobility: Supine to Sit     Supine to sit: Min guard     General bed mobility comments: slightly impulsive, requiring increased cuing for not reaching up to RW to pull up, eventually had to remove RW from reach before she would reach for bedrail instead    Transfers Overall transfer level: Needs assistance Equipment used: Rolling walker (2 wheeled);None Transfers: Sit to/from Stand Sit to Stand: Min assist Stand pivot transfers: Min assist       General transfer comment: MinA for steadying wtih standing from bed to RW and for pivoting <>BSC without AD  Ambulation/Gait Ambulation/Gait assistance: Min assist;Mod assist Gait Distance (Feet): 50 Feet Assistive device: Rolling walker (2 wheeled) Gait Pattern/deviations: Ataxic;Decreased stride  length;Narrow base of support;Step-to pattern;Step-through pattern;Decreased dorsiflexion - right Gait velocity: decreased Gait velocity interpretation: <1.31 ft/sec, indicative of household ambulator General Gait Details: initiated ambulation with min A with limited R LE weakness and improved dorsiflexion however with fatigue increased R knee buckling and decreased R ankle dorsiflexion      Modified Rankin (Stroke Patients Only) Modified Rankin (Stroke Patients Only) Pre-Morbid Rankin Score: No symptoms Modified Rankin: Moderately severe disability     Balance Overall balance assessment: Needs assistance Sitting-balance support: Feet supported Sitting balance-Leahy Scale: Good     Standing balance support: Bilateral upper extremity supported Standing balance-Leahy Scale: Poor Standing balance comment: reliant on UE support                            Cognition Arousal/Alertness: Awake/alert Behavior During Therapy: Flat affect;Impulsive Overall Cognitive Status: Impaired/Different from baseline Area of Impairment: Awareness;Problem solving                           Awareness: Emergent Problem Solving: Slow processing General Comments: needs cuing to slow down, underestimates abilities         General Comments General comments (skin integrity, edema, etc.): Pt's daughter in room. VSS on RA      Pertinent Vitals/Pain Pain Assessment: No/denies pain           PT Goals (current goals can now be found in the care plan section) Acute Rehab PT Goals Patient Stated Goal: return to baseline PT Goal Formulation: With patient Time For Goal Achievement: 02/04/21 Potential to Achieve Goals: Good  Progress towards PT goals: Progressing toward goals    Frequency    Min 4X/week      PT Plan Current plan remains appropriate       AM-PAC PT "6 Clicks" Mobility   Outcome Measure  Help needed turning from your back to your side while in a flat  bed without using bedrails?: A Little Help needed moving from lying on your back to sitting on the side of a flat bed without using bedrails?: A Little Help needed moving to and from a bed to a chair (including a wheelchair)?: A Little Help needed standing up from a chair using your arms (e.g., wheelchair or bedside chair)?: A Little Help needed to walk in hospital room?: A Lot Help needed climbing 3-5 steps with a railing? : A Lot 6 Click Score: 16    End of Session Equipment Utilized During Treatment: Gait belt Activity Tolerance: Patient tolerated treatment well Patient left: in bed;with call bell/phone within reach;with family/visitor present Nurse Communication: Mobility status PT Visit Diagnosis: Unsteadiness on feet (R26.81);Ataxic gait (R26.0);Difficulty in walking, not elsewhere classified (R26.2)     Time: 8756-4332 PT Time Calculation (min) (ACUTE ONLY): 21 min  Charges:  $Gait Training: 8-22 mins                     Talayah Picardi B. Migdalia Dk PT, DPT Acute Rehabilitation Services Pager [phone] Office [phone]    [place] 01/23/2021, 3:35 PM

## 2021-01-24 LAB — MAGNESIUM: Magnesium: 1.9 mg/dL (ref 1.7–2.4)

## 2021-01-24 LAB — CBC WITH DIFFERENTIAL/PLATELET
Abs Immature Granulocytes: 0.01 10*3/uL (ref 0.00–0.07)
Basophils Absolute: 0 10*3/uL (ref 0.0–0.1)
Basophils Relative: 1 %
Eosinophils Absolute: 0.3 10*3/uL (ref 0.0–0.5)
Eosinophils Relative: 3 %
HCT: 39.8 % (ref 36.0–46.0)
Hemoglobin: 13.5 g/dL (ref 12.0–15.0)
Immature Granulocytes: 0 %
Lymphocytes Relative: 39 %
Lymphs Abs: 2.9 10*3/uL (ref 0.7–4.0)
MCH: 32.4 pg (ref 26.0–34.0)
MCHC: 33.9 g/dL (ref 30.0–36.0)
MCV: 95.4 fL (ref 80.0–100.0)
Monocytes Absolute: 0.5 10*3/uL (ref 0.1–1.0)
Monocytes Relative: 7 %
Neutro Abs: 3.9 10*3/uL (ref 1.7–7.7)
Neutrophils Relative %: 50 %
Platelets: 157 10*3/uL (ref 150–400)
RBC: 4.17 MIL/uL (ref 3.87–5.11)
RDW: 12.8 % (ref 11.5–15.5)
WBC: 7.6 10*3/uL (ref 4.0–10.5)
nRBC: 0 % (ref 0.0–0.2)

## 2021-01-24 LAB — GLUCOSE, CAPILLARY
Glucose-Capillary: 147 mg/dL — ABNORMAL HIGH (ref 70–99)
Glucose-Capillary: 120 mg/dL — ABNORMAL HIGH (ref 70–99)
Glucose-Capillary: 102 mg/dL — ABNORMAL HIGH (ref 70–99)
Glucose-Capillary: 236 mg/dL — ABNORMAL HIGH (ref 70–99)

## 2021-01-24 LAB — COMPREHENSIVE METABOLIC PANEL
ALT: 18 U/L (ref 0–44)
AST: 20 U/L (ref 15–41)
Albumin: 3.4 g/dL — ABNORMAL LOW (ref 3.5–5.0)
Alkaline Phosphatase: 101 U/L (ref 38–126)
Anion gap: 7 (ref 5–15)
BUN: 23 mg/dL (ref 8–23)
CO2: 21 mmol/L — ABNORMAL LOW (ref 22–32)
Calcium: 9.1 mg/dL (ref 8.9–10.3)
Chloride: 110 mmol/L (ref 98–111)
Creatinine, Ser: 1.09 mg/dL — ABNORMAL HIGH (ref 0.44–1.00)
GFR, Estimated: 57 mL/min — ABNORMAL LOW (ref >60)
Glucose, Bld: 127 mg/dL — ABNORMAL HIGH (ref 70–99)
Potassium: 3.9 mmol/L (ref 3.5–5.1)
Sodium: 138 mmol/L (ref 135–145)
Total Bilirubin: 0.3 mg/dL (ref 0.3–1.2)
Total Protein: 6.1 g/dL — ABNORMAL LOW (ref 6.5–8.1)

## 2021-01-24 LAB — PHOSPHORUS: Phosphorus: 4.5 mg/dL (ref 2.5–4.6)

## 2021-01-24 NOTE — Progress Notes (Signed)
PROGRESS NOTE    Samantha Huffman  FVC:[id number] DOB: 04/26/1956 DOA: 01/20/2021 PCP: Alroy Dust, L.Marlou Sa, MD   No chief complaint on file.  Brief Narrative:  Samantha Huffman is Samantha Huffman 65 y.o. female seen in ed with complaints of  Unsteady gait and right arm and right leg weakness since Sunday. Pt is post BL cataract sx and was lifting water case  over weekend and since then this has started. Pt does reports headache and balance issue and no chest pain sob, or bleeding or abdominal or urinary issue. Pt states that Sunday night she realized the case of water was in the truck so she went to get it and when she put th e case down she has sharp pain on rt side of her neck and she returned to bed and then in am woke up and realized her rt arm and leg was not feeling the same and both her daughter were there to bathe her and feed her but she has been feeling tired and so came to er.  Pt has past medical history of HTN, DM II, Seizure,GERD.  Assessment & Plan:   Principal Problem:   Acute cerebrovascular accident (CVA) ([place]) Active Problems:   Diabetes mellitus without complication ([place])   Dyslipidemia   Seizures ([place])   CAD (coronary artery disease)   Essential hypertension   Carotid artery stenosis, asymptomatic   Tobacco abuse   Acute CVA (cerebrovascular accident) ([place])  Acute ischemic CVA  3 cm acute infarct right superior cerebellum  Right PICA Territory Infarct  Small Acute Infarct L [place]:  CTA head/neck without emergent LVO, proximal occlusion of right superior cerebellar artery MRA head/neck with mild atherosclerotic disease in carotid bulb bilateral without significant stenosis, mild narroiwing proximal right vertebral artery (2/2 atherosclerotic disease vs dissection - recommended consider CT angio head/neck).  Vertebral arteries patent to the basilar, R PICA not visualized.  Small L PICA.  R superior cerebral artery occluded proximally compatible with infarct.   MRI brain with 3 cm acute  infarct right superior cerebellum, smaller acute infarct right PICA territory, small acute infarct left thalamus  DAPT (aspirin 325, plavix x3 months, then aspirin 81/plavix), Lipitor 80 LDL 57, HDL 42 A1c 7.1 Permissive hypertension  Repeat head CT 6/3 with stable appearance of recent R SCA, small R PICA, and L thalamic lacunar infarcts - no associated hemorrhage or mass effect Neurology c/s, appreciate recs -> etiology unclear, possible vertebral artery dissection?  Right SCA large infarct with slight 4th ventricle compression.  repeat CT 6/3 to r/o hydrocephalus was without hemorrhage or mass efffect - stable appearance of recent R SCA, small R PICA and L thalamic lacunar infarcts.  Aspirin 325 and plavix 75 mg x3 months, then back to aspirin 81 and plavix 75 mg DAPT home dose.  She needs stat CT if neuro changes to r/o hydrocephalus.  PT, OT, SLP.  Aspiration precautions.  PM&R recommended CIR, awaiting insurance approval.  Echo with EF 63-84%, grade I diastolic dysfunction   Acute Kidney Injury Improved, follow UA - bland, follow  IVF  T2DM Sliding scale insulin Follow A1c 7.1 Accu-Cheks and hypoglycemia protocol.  Dyslipidemia: Continue lipitor   Seizures: seizure precautions. Jodi Marble continued.   CAD:  Continue patient on aspirin and statin therapy. Will resume coreg, low dose  Hypertension: As needed labetalol for systolic blood pressure of 220 and above. Home regimen of Coreg 12.5, Lasix 20 mg, Imdur 120 mg all held for permissive hypertension.  Resume coreg at lower dose -  doesn't appear she was taking BP meds due to cost  Will resume gradually   Carotid artery stenosis:  Imaging notable for mild atherosclerotic dz in carotid bulb bilaterally without significant stenosis   Tobacco abuse: Counseling once patient is stable.  Concern for ability to afford meds, toc c/s - she notes she wasn't taking all of her meds prior to admission (including reported  DAPT)  DVT prophylaxis: SCD Code Status: full  Family Communication: none at bedside Disposition:   Status is: Inpatient  Remains inpatient appropriate because:Inpatient level of care appropriate due to severity of illness   Dispo: The patient is from: Home              Anticipated d/c is to: pending              Patient currently is not medically stable to d/c.   Difficult to place patient No       Consultants:   neurology  Procedures: Echo IMPRESSIONS    1. Left ventricular ejection fraction, by estimation, is 60 to 65%. The  left ventricle has normal function. The left ventricle has no regional  wall motion abnormalities. There is mild left ventricular hypertrophy.  Left ventricular diastolic parameters  are consistent with Grade I diastolic dysfunction (impaired relaxation).  2. Right ventricular systolic function is normal. The right ventricular  size is normal. Tricuspid regurgitation signal is inadequate for assessing  PA pressure.  3. The mitral valve is grossly normal. Trivial mitral valve  regurgitation. No evidence of mitral stenosis.  4. The aortic valve is tricuspid. Aortic valve regurgitation is not  visualized. No aortic stenosis is present.  5. The inferior vena cava is normal in size with greater than 50%  respiratory variability, suggesting right atrial pressure of 3 mmHg.   Conclusion(s)/Recommendation(s): No intracardiac source of embolism  detected on this transthoracic study. Teleah Villamar transesophageal echocardiogram is  recommended to exclude cardiac source of embolism if clinically indicated.  Antimicrobials:  Anti-infectives (From admission, onward)   None     Subjective: No complaints today  Objective: Vitals:   01/23/21 1720 01/23/21 2006 01/23/21 2350 01/24/21 0355  BP: (!) 143/73 (!) 164/69 (!) 149/82 (!) 150/83  Pulse: 95 73 78 76  Resp: 18 19 18 18   Temp: 98.3 F (36.8 C) 98.6 F (37 C) 98.3 F (36.8 C) 98.5 F (36.9 C)   TempSrc: Oral Oral Oral Oral  SpO2: 98% 98% 98% 100%  Weight:    65.7 kg    Intake/Output Summary (Last 24 hours) at 01/24/2021 0848 Last data filed at 01/23/2021 1800 Gross per 24 hour  Intake 542.5 ml  Output --  Net 542.5 ml   Filed Weights   01/24/21 0355  Weight: 65.7 kg    Examination:  General: No acute distress. Cardiovascular: Heart sounds show Doyal Saric regular rate, and rhythm. . Lungs: Clear to auscultation bilaterally  Abdomen: Soft, nontender, nondistended Neurological: Alert and oriented 3. R sided facial droop.  RUE dysmetria FNF.  Skin: Warm and dry. No rashes or lesions. Extremities: No clubbing or cyanosis. No edema.   Data Reviewed: I have personally reviewed following labs and imaging studies  CBC: Recent Labs  Lab 01/20/21 1240 01/20/21 1248 01/22/21 0253 01/23/21 0341 01/24/21 0054  WBC 8.1  --  8.4 8.2 7.6  NEUTROABS 4.7  --  4.3 4.3 3.9  HGB 16.5* 16.7* 14.8 13.8 13.5  HCT 49.3* 49.0* 43.8 41.5 39.8  MCV 95.4  --  94.8 95.6 95.4  PLT 220  --  183 188 665    Basic Metabolic Panel: Recent Labs  Lab 01/20/21 1240 01/20/21 1248 01/21/21 0508 01/22/21 0253 01/23/21 0341 01/24/21 0054  NA 137 140 138 138 137 138  K 4.2 4.3 3.9 4.1 3.9 3.9  CL 105 106 108 106 108 110  CO2 23  --  23 23 21* 21*  GLUCOSE 164* 164* 127* 139* 149* 127*  BUN 14 16 14 20 20 23   CREATININE 0.98 0.90 0.98 1.13* 1.20* 1.09*  CALCIUM 10.1  --  9.6 9.5 9.4 9.1  MG  --   --   --  2.0 1.9 1.9  PHOS  --   --   --  4.7* 4.4 4.5    GFR: Estimated Creatinine Clearance: 45.3 mL/min (Chablis Losh) (by C-G formula based on SCr of 1.09 mg/dL (H)).  Liver Function Tests: Recent Labs  Lab 01/20/21 1240 01/21/21 0508 01/22/21 0253 01/23/21 0341 01/24/21 0054  AST 21 22 20 22 20   ALT 17 19 18 18 18   ALKPHOS 127* 114 106 118 101  BILITOT 0.8 0.9 0.6 0.5 0.3  PROT 8.1 7.1 7.0 6.5 6.1*  ALBUMIN 4.6 4.0 3.9 3.8 3.4*    CBG: Recent Labs  Lab 01/23/21 0614 01/23/21 1103  01/23/21 1553 01/23/21 2111 01/24/21 0556  GLUCAP 148* 133* 117* 156* 147*     Recent Results (from the past 240 hour(s))  Resp Panel by RT-PCR (Flu Dakari Cregger&B, Covid) Nasopharyngeal Swab     Status: None   Collection Time: 01/20/21  3:52 PM   Specimen: Nasopharyngeal Swab; Nasopharyngeal(NP) swabs in vial transport medium  Result Value Ref Range Status   SARS Coronavirus 2 by RT PCR NEGATIVE NEGATIVE Final    Comment: (NOTE) SARS-CoV-2 target nucleic acids are NOT DETECTED.  The SARS-CoV-2 RNA is generally detectable in upper respiratory specimens during the acute phase of infection. The lowest concentration of SARS-CoV-2 viral copies this assay can detect is 138 copies/mL. Sondos Wolfman negative result does not preclude SARS-Cov-2 infection and should not be used as the sole basis for treatment or other patient management decisions. Akiva Brassfield negative result may occur with  improper specimen collection/handling, submission of specimen other than nasopharyngeal swab, presence of viral mutation(s) within the areas targeted by this assay, and inadequate number of viral copies(<138 copies/mL). Kenyada Dosch negative result must be combined with clinical observations, patient history, and epidemiological information. The expected result is Negative.  Fact Sheet for Patients:  EntrepreneurPulse.com.au  Fact Sheet for Healthcare Providers:  IncredibleEmployment.be  This test is no t yet approved or cleared by the [place] FDA and  has been authorized for detection and/or diagnosis of SARS-CoV-2 by FDA under an Emergency Use Authorization (EUA). This EUA will remain  in effect (meaning this test can be used) for the duration of the COVID-19 declaration under Section 564(b)(1) of the Act, 21 U.S.C.section 360bbb-3(b)(1), unless the authorization is terminated  or revoked sooner.       Influenza Semya Klinke by PCR NEGATIVE NEGATIVE Final   Influenza B by PCR NEGATIVE NEGATIVE Final     Comment: (NOTE) The Xpert Xpress SARS-CoV-2/FLU/RSV plus assay is intended as an aid in the diagnosis of influenza from Nasopharyngeal swab specimens and should not be used as Chee Dimon sole basis for treatment. Nasal washings and aspirates are unacceptable for Xpert Xpress SARS-CoV-2/FLU/RSV testing.  Fact Sheet for Patients: EntrepreneurPulse.com.au  Fact Sheet for Healthcare Providers: IncredibleEmployment.be  This test is not yet approved or cleared by the [place] FDA  and has been authorized for detection and/or diagnosis of SARS-CoV-2 by FDA under an Emergency Use Authorization (EUA). This EUA will remain in effect (meaning this test can be used) for the duration of the COVID-19 declaration under Section 564(b)(1) of the Act, 21 U.S.C. section 360bbb-3(b)(1), unless the authorization is terminated or revoked.  Performed at [facility], [place] [address] [place], [place] [address]          Radiology Studies: CT HEAD WO CONTRAST  Result Date: 01/23/2021 CLINICAL DATA:  65 year old female with neurologic deficit, right superior cerebellar artery occlusion and infarct. EXAM: CT HEAD WITHOUT CONTRAST TECHNIQUE: Contiguous axial images were obtained from the base of the skull through the vertex without intravenous contrast. COMPARISON:  CTA head and neck, MRI and MRA 01/20/2021 and earlier. FINDINGS: Brain: Confluent cytotoxic edema in the right cerebellum SCA territory has not significantly changed from the CT on 01/20/2021. Superimposed small linear right PICA territory infarcts also stable. No significant posterior fossa mass effect. Recent left thalamic lacunar type infarct appears stable. No acute intracranial hemorrhage identified. No intracranial mass effect. No ventriculomegaly. Stable gray-white matter differentiation elsewhere. Vascular: Calcified atherosclerosis at the skull base. Skull: No acute osseous abnormality identified.  Sinuses/Orbits: Visualized paranasal sinuses and mastoids are stable and well aerated. Other: No acute orbit or scalp soft tissue finding. IMPRESSION: 1. Stable appearance of the the recent Right SCA, small Right PICA, and left thalamic lacunar infarcts. No associated hemorrhage or mass effect. 2. No new intracranial abnormality. Electronically Signed   By: Genevie Ann M.D.   On: 01/23/2021 06:46        Scheduled Meds: .  stroke: mapping our early stages of recovery book   Does not apply Once  .  stroke: mapping our early stages of recovery book   Does not apply Once  . aspirin EC  325 mg Oral Daily  . atorvastatin  80 mg Oral Daily  . carvedilol  3.125 mg Oral BID  . clopidogrel  75 mg Oral Daily  . escitalopram  10 mg Oral QHS  . ezetimibe  10 mg Oral QHS  . insulin aspart  0-15 Units Subcutaneous TID WC  . levETIRAcetam  500 mg Oral BID  . pantoprazole  40 mg Oral Daily  . polyethylene glycol  17 g Oral BID  . topiramate  75 mg Oral BID   Continuous Infusions: . lactated ringers 75 mL/hr at 01/23/21 1046     LOS: 4 days    Time spent: over 30 min    Fayrene Helper, MD [facility]   To contact the attending provider between 7A-7P or the covering provider during after hours 7P-7A, please log into the web site [email or web address] and access using universal [place] password for that web site. If you do not have the password, please call the hospital operator.  01/24/2021, 8:48 AM

## 2021-01-25 LAB — CBC WITH DIFFERENTIAL/PLATELET
Abs Immature Granulocytes: 0.04 10*3/uL (ref 0.00–0.07)
Basophils Absolute: 0 10*3/uL (ref 0.0–0.1)
Basophils Relative: 1 %
Eosinophils Absolute: 0.3 10*3/uL (ref 0.0–0.5)
Eosinophils Relative: 4 %
HCT: 39 % (ref 36.0–46.0)
Hemoglobin: 13 g/dL (ref 12.0–15.0)
Immature Granulocytes: 1 %
Lymphocytes Relative: 41 %
Lymphs Abs: 3.2 10*3/uL (ref 0.7–4.0)
MCH: 32.1 pg (ref 26.0–34.0)
MCHC: 33.3 g/dL (ref 30.0–36.0)
MCV: 96.3 fL (ref 80.0–100.0)
Monocytes Absolute: 0.6 10*3/uL (ref 0.1–1.0)
Monocytes Relative: 8 %
Neutro Abs: 3.6 10*3/uL (ref 1.7–7.7)
Neutrophils Relative %: 45 %
Platelets: 163 10*3/uL (ref 150–400)
RBC: 4.05 MIL/uL (ref 3.87–5.11)
RDW: 12.6 % (ref 11.5–15.5)
WBC: 7.8 10*3/uL (ref 4.0–10.5)
nRBC: 0 % (ref 0.0–0.2)

## 2021-01-25 LAB — COMPREHENSIVE METABOLIC PANEL
ALT: 20 U/L (ref 0–44)
AST: 21 U/L (ref 15–41)
Albumin: 3.5 g/dL (ref 3.5–5.0)
Alkaline Phosphatase: 102 U/L (ref 38–126)
Anion gap: 7 (ref 5–15)
BUN: 18 mg/dL (ref 8–23)
CO2: 23 mmol/L (ref 22–32)
Calcium: 9.3 mg/dL (ref 8.9–10.3)
Chloride: 112 mmol/L — ABNORMAL HIGH (ref 98–111)
Creatinine, Ser: 1.1 mg/dL — ABNORMAL HIGH (ref 0.44–1.00)
GFR, Estimated: 56 mL/min — ABNORMAL LOW (ref >60)
Glucose, Bld: 173 mg/dL — ABNORMAL HIGH (ref 70–99)
Potassium: 4.1 mmol/L (ref 3.5–5.1)
Sodium: 142 mmol/L (ref 135–145)
Total Bilirubin: 0.5 mg/dL (ref 0.3–1.2)
Total Protein: 6.3 g/dL — ABNORMAL LOW (ref 6.5–8.1)

## 2021-01-25 LAB — PHOSPHORUS: Phosphorus: 4.7 mg/dL — ABNORMAL HIGH (ref 2.5–4.6)

## 2021-01-25 LAB — GLUCOSE, CAPILLARY
Glucose-Capillary: 236 mg/dL — ABNORMAL HIGH (ref 70–99)
Glucose-Capillary: 142 mg/dL — ABNORMAL HIGH (ref 70–99)
Glucose-Capillary: 101 mg/dL — ABNORMAL HIGH (ref 70–99)

## 2021-01-25 LAB — MAGNESIUM: Magnesium: 1.8 mg/dL (ref 1.7–2.4)

## 2021-01-25 MED ORDER — ISOSORBIDE MONONITRATE ER 30 MG PO TB24
30.0000 mg | ORAL_TABLET | Freq: Every day | ORAL | Status: DC
  Administered 2021-01-25 – 2021-01-26 (×2): 30 mg via ORAL
  Filled 2021-01-25 (×2): qty 1

## 2021-01-25 NOTE — Progress Notes (Signed)
PROGRESS NOTE    Samantha Huffman  SNK:[id number] DOB: 1955/10/15 DOA: 01/20/2021 PCP: Alroy Dust, L.Marlou Sa, MD   No chief complaint on file.  Brief Narrative:  Samantha Huffman is Samantha Huffman 65 y.o. female seen in ed with complaints of  Unsteady gait and right arm and right leg weakness since Sunday. Pt is post BL cataract sx and was lifting water case  over weekend and since then this has started. Pt does reports headache and balance issue and no chest pain sob, or bleeding or abdominal or urinary issue. Pt states that Sunday night she realized the case of water was in the truck so she went to get it and when she put th e case down she has sharp pain on rt side of her neck and she returned to bed and then in am woke up and realized her rt arm and leg was not feeling the same and both her daughter were there to bathe her and feed her but she has been feeling tired and so came to er.  Pt has past medical history of HTN, DM II, Seizure,GERD.  Assessment & Plan:   Principal Problem:   Acute cerebrovascular accident (CVA) ([place]) Active Problems:   Diabetes mellitus without complication ([place])   Dyslipidemia   Seizures ([place])   CAD (coronary artery disease)   Essential hypertension   Carotid artery stenosis, asymptomatic   Tobacco abuse   Acute CVA (cerebrovascular accident) ([place])  Acute ischemic CVA  3 cm acute infarct right superior cerebellum  Right PICA [place] Infarct  Small Acute Infarct L [place]:  CTA head/neck without emergent LVO, proximal occlusion of right superior cerebellar artery MRA head/neck with mild atherosclerotic disease in carotid bulb bilateral without significant stenosis, mild narroiwing proximal right vertebral artery (2/2 atherosclerotic disease vs dissection - recommended consider CT angio head/neck).  Vertebral arteries patent to the basilar, R PICA not visualized.  Small L PICA.  R superior cerebral artery occluded proximally compatible with infarct.   MRI brain with 3 cm acute  infarct right superior cerebellum, smaller acute infarct right PICA territory, small acute infarct left thalamus  DAPT (aspirin 325, plavix x3 months, then aspirin 81/plavix), Lipitor 80 LDL 57, HDL 42 A1c 7.1 Permissive hypertension  Repeat head CT 6/3 with stable appearance of recent R SCA, small R PICA, and L thalamic lacunar infarcts - no associated hemorrhage or mass effect Neurology c/s, appreciate recs -> etiology unclear, possible vertebral artery dissection?  Right SCA large infarct with slight 4th ventricle compression.  repeat CT 6/3 to r/o hydrocephalus was without hemorrhage or mass efffect - stable appearance of recent R SCA, small R PICA and L thalamic lacunar infarcts.  Aspirin 325 and plavix 75 mg x3 months, then back to aspirin 81 and plavix 75 mg DAPT home dose.  She needs stat CT if neuro changes to r/o hydrocephalus.  PT, OT, SLP.  Aspiration precautions.  PM&R recommended CIR, awaiting insurance approval.  Echo with EF 93-79%, grade I diastolic dysfunction   Acute Kidney Injury Improved, follow UA - bland, follow  IVF  T2DM Sliding scale insulin Follow A1c 7.1 Accu-Cheks and hypoglycemia protocol.  Dyslipidemia: Continue lipitor   Seizures: seizure precautions. Jodi Marble continued.   CAD:  Continue patient on aspirin and statin therapy. Will resume coreg, and imdur low dose  Hypertension: As needed labetalol for systolic blood pressure of 220 and above. Home regimen of Coreg 12.5, Lasix 20 mg, Imdur 120 mg all held for permissive hypertension.  Resume coreg at lower  dose.  Will resume imdur at lower dose - doesn't appear she was taking BP meds due to cost  Will resume gradually   Carotid artery stenosis:  Imaging notable for mild atherosclerotic dz in carotid bulb bilaterally without significant stenosis   Tobacco abuse: Counseling once patient is stable.  Concern for ability to afford meds, toc c/s - she notes she wasn't taking all of her meds  prior to admission (including reported DAPT)  DVT prophylaxis: SCD Code Status: full  Family Communication: none at bedside Disposition:   Status is: Inpatient  Remains inpatient appropriate because:Inpatient level of care appropriate due to severity of illness   Dispo: The patient is from: Home              Anticipated d/c is to: pending              Patient currently is not medically stable to d/c.   Difficult to place patient No       Consultants:   neurology  Procedures: Echo IMPRESSIONS    1. Left ventricular ejection fraction, by estimation, is 60 to 65%. The  left ventricle has normal function. The left ventricle has no regional  wall motion abnormalities. There is mild left ventricular hypertrophy.  Left ventricular diastolic parameters  are consistent with Grade I diastolic dysfunction (impaired relaxation).  2. Right ventricular systolic function is normal. The right ventricular  size is normal. Tricuspid regurgitation signal is inadequate for assessing  PA pressure.  3. The mitral valve is grossly normal. Trivial mitral valve  regurgitation. No evidence of mitral stenosis.  4. The aortic valve is tricuspid. Aortic valve regurgitation is not  visualized. No aortic stenosis is present.  5. The inferior vena cava is normal in size with greater than 50%  respiratory variability, suggesting right atrial pressure of 3 mmHg.   Conclusion(s)/Recommendation(s): No intracardiac source of embolism  detected on this transthoracic study. Maly Lemarr transesophageal echocardiogram is  recommended to exclude cardiac source of embolism if clinically indicated.  Antimicrobials:  Anti-infectives (From admission, onward)   None     Subjective: No new complaints  Objective: Vitals:   01/24/21 2341 01/25/21 0451 01/25/21 0810 01/25/21 1200  BP: (!) 168/69 (!) 154/76 (!) 155/74 136/77  Pulse: 68 63 67 79  Resp: 17 17 18 18   Temp: 98.7 F (37.1 C) 98.7 F (37.1 C) 98.6  F (37 C) 98.9 F (37.2 C)  TempSrc: Oral Oral Oral Oral  SpO2: 98% 99% 100% 99%  Weight:       No intake or output data in the 24 hours ending 01/25/21 1530 Filed Weights   01/24/21 0355  Weight: 65.7 kg    Examination:  General: No acute distress. Cardiovascular: Heart sounds show Willam Munford regular rate, and rhythm. Lungs: Clear to auscultation bilaterally  Abdomen: Soft, nontender, nondistended Neurological: Alert and oriented 3. Moves all extremities 4 with symmetric strength.  R FNF with dysmetria.  R facial droop.  Skin: Warm and dry. No rashes or lesions. Extremities: No clubbing or cyanosis. No edema.    Data Reviewed: I have personally reviewed following labs and imaging studies  CBC: Recent Labs  Lab 01/20/21 1240 01/20/21 1248 01/22/21 0253 01/23/21 0341 01/24/21 0054 01/25/21 0304  WBC 8.1  --  8.4 8.2 7.6 7.8  NEUTROABS 4.7  --  4.3 4.3 3.9 3.6  HGB 16.5* 16.7* 14.8 13.8 13.5 13.0  HCT 49.3* 49.0* 43.8 41.5 39.8 39.0  MCV 95.4  --  94.8 95.6 95.4  96.3  PLT 220  --  183 188 157 378    Basic Metabolic Panel: Recent Labs  Lab 01/21/21 0508 01/22/21 0253 01/23/21 0341 01/24/21 0054 01/25/21 0304  NA 138 138 137 138 142  K 3.9 4.1 3.9 3.9 4.1  CL 108 106 108 110 112*  CO2 23 23 21* 21* 23  GLUCOSE 127* 139* 149* 127* 173*  BUN 14 20 20 23 18   CREATININE 0.98 1.13* 1.20* 1.09* 1.10*  CALCIUM 9.6 9.5 9.4 9.1 9.3  MG  --  2.0 1.9 1.9 1.8  PHOS  --  4.7* 4.4 4.5 4.7*    GFR: Estimated Creatinine Clearance: 44.9 mL/min (Dewana Ammirati) (by C-G formula based on SCr of 1.1 mg/dL (H)).  Liver Function Tests: Recent Labs  Lab 01/21/21 0508 01/22/21 0253 01/23/21 0341 01/24/21 0054 01/25/21 0304  AST 22 20 22 20 21   ALT 19 18 18 18 20   ALKPHOS 114 106 118 101 102  BILITOT 0.9 0.6 0.5 0.3 0.5  PROT 7.1 7.0 6.5 6.1* 6.3*  ALBUMIN 4.0 3.9 3.8 3.4* 3.5    CBG: Recent Labs  Lab 01/24/21 1100 01/24/21 1600 01/24/21 2119 01/25/21 0605 01/25/21 1039   GLUCAP 120* 102* 236* 236* 142*     Recent Results (from the past 240 hour(s))  Resp Panel by RT-PCR (Flu Dashauna Heymann&B, Covid) Nasopharyngeal Swab     Status: None   Collection Time: 01/20/21  3:52 PM   Specimen: Nasopharyngeal Swab; Nasopharyngeal(NP) swabs in vial transport medium  Result Value Ref Range Status   SARS Coronavirus 2 by RT PCR NEGATIVE NEGATIVE Final    Comment: (NOTE) SARS-CoV-2 target nucleic acids are NOT DETECTED.  The SARS-CoV-2 RNA is generally detectable in upper respiratory specimens during the acute phase of infection. The lowest concentration of SARS-CoV-2 viral copies this assay can detect is 138 copies/mL. Carlton Sweaney negative result does not preclude SARS-Cov-2 infection and should not be used as the sole basis for treatment or other patient management decisions. Athanasius Kesling negative result may occur with  improper specimen collection/handling, submission of specimen other than nasopharyngeal swab, presence of viral mutation(s) within the areas targeted by this assay, and inadequate number of viral copies(<138 copies/mL). Cayton Cuevas negative result must be combined with clinical observations, patient history, and epidemiological information. The expected result is Negative.  Fact Sheet for Patients:  EntrepreneurPulse.com.au  Fact Sheet for Healthcare Providers:  IncredibleEmployment.be  This test is no t yet approved or cleared by the [place] FDA and  has been authorized for detection and/or diagnosis of SARS-CoV-2 by FDA under an Emergency Use Authorization (EUA). This EUA will remain  in effect (meaning this test can be used) for the duration of the COVID-19 declaration under Section 564(b)(1) of the Act, 21 U.S.C.section 360bbb-3(b)(1), unless the authorization is terminated  or revoked sooner.       Influenza Isaak Delmundo by PCR NEGATIVE NEGATIVE Final   Influenza B by PCR NEGATIVE NEGATIVE Final    Comment: (NOTE) The Xpert Xpress  SARS-CoV-2/FLU/RSV plus assay is intended as an aid in the diagnosis of influenza from Nasopharyngeal swab specimens and should not be used as Shermaine Brigham sole basis for treatment. Nasal washings and aspirates are unacceptable for Xpert Xpress SARS-CoV-2/FLU/RSV testing.  Fact Sheet for Patients: EntrepreneurPulse.com.au  Fact Sheet for Healthcare Providers: IncredibleEmployment.be  This test is not yet approved or cleared by the [place] FDA and has been authorized for detection and/or diagnosis of SARS-CoV-2 by FDA under an Emergency Use Authorization (EUA). This EUA will  remain in effect (meaning this test can be used) for the duration of the COVID-19 declaration under Section 564(b)(1) of the Act, 21 U.S.C. section 360bbb-3(b)(1), unless the authorization is terminated or revoked.  Performed at [facility], [place] [address], [place], [place] [address]          Radiology Studies: No results found.      Scheduled Meds: .  stroke: mapping our early stages of recovery book   Does not apply Once  .  stroke: mapping our early stages of recovery book   Does not apply Once  . aspirin EC  325 mg Oral Daily  . atorvastatin  80 mg Oral Daily  . carvedilol  3.125 mg Oral BID  . clopidogrel  75 mg Oral Daily  . escitalopram  10 mg Oral QHS  . ezetimibe  10 mg Oral QHS  . insulin aspart  0-15 Units Subcutaneous TID WC  . levETIRAcetam  500 mg Oral BID  . pantoprazole  40 mg Oral Daily  . polyethylene glycol  17 g Oral BID  . topiramate  75 mg Oral BID   Continuous Infusions:    LOS: 5 days    Time spent: over 30 min    Fayrene Helper, MD [facility]   To contact the attending provider between 7A-7P or the covering provider during after hours 7P-7A, please log into the web site [email or web address] and access using universal [place] password for that web site. If you do not have the password, please call the hospital  operator.  01/25/2021, 3:30 PM

## 2021-01-26 ENCOUNTER — Inpatient Hospital Stay (HOSPITAL_COMMUNITY): Payer: Medicare HMO

## 2021-01-26 LAB — GLUCOSE, CAPILLARY
Glucose-Capillary: 160 mg/dL — ABNORMAL HIGH (ref 70–99)
Glucose-Capillary: 154 mg/dL — ABNORMAL HIGH (ref 70–99)
Glucose-Capillary: 135 mg/dL — ABNORMAL HIGH (ref 70–99)
Glucose-Capillary: 121 mg/dL — ABNORMAL HIGH (ref 70–99)
Glucose-Capillary: 136 mg/dL — ABNORMAL HIGH (ref 70–99)

## 2021-01-26 IMAGING — CT CT HEAD W/O CM
4 series · 16 of 47 positions shown, 18 images · non-contrast
Comparison: [DATE]

CLINICAL DATA: Neurological deficit, like symptoms

EXAM:
CT HEAD WITHOUT CONTRAST
TECHNIQUE: Contiguous axial images were obtained from the base of the skull
through the vertex without intravenous contrast. Sagittal and
coronal MPR images reconstructed from axial data set.

[Series 3: head without · axial · non-contrast · 0.41mm/px · z∈[-197,-77]mm · 7 of 32 slices shown, 9 images]
[im 4/32  brain]
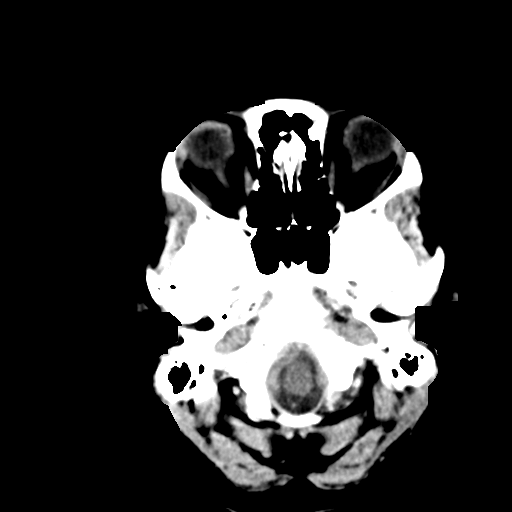
[im 4/32  bone]
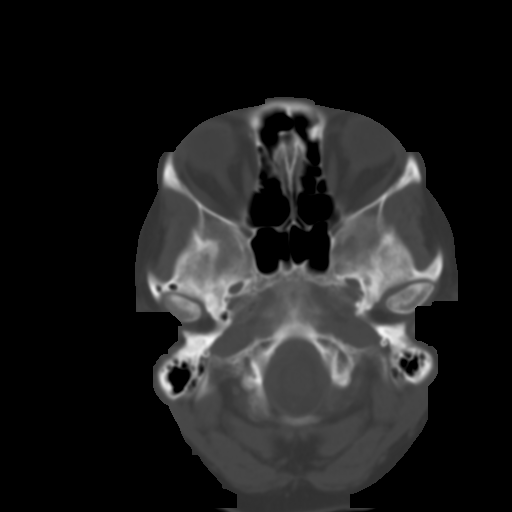
[im 8/32  brain]
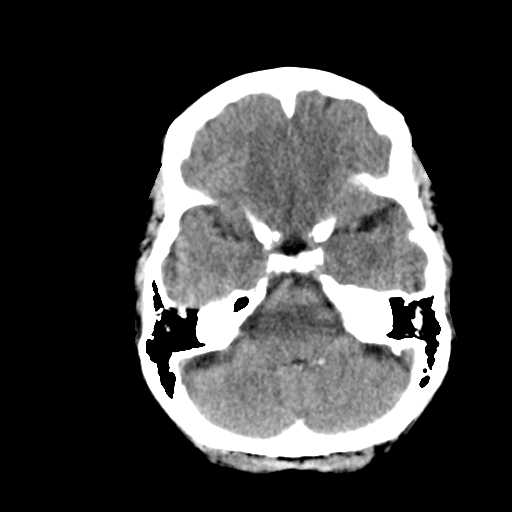
[im 12/32  brain]
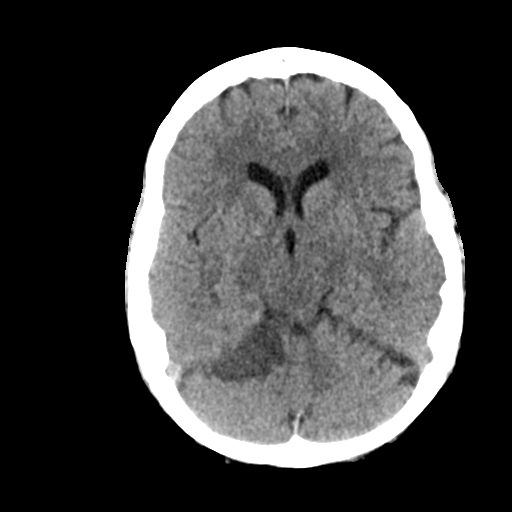
[im 16/32  brain]
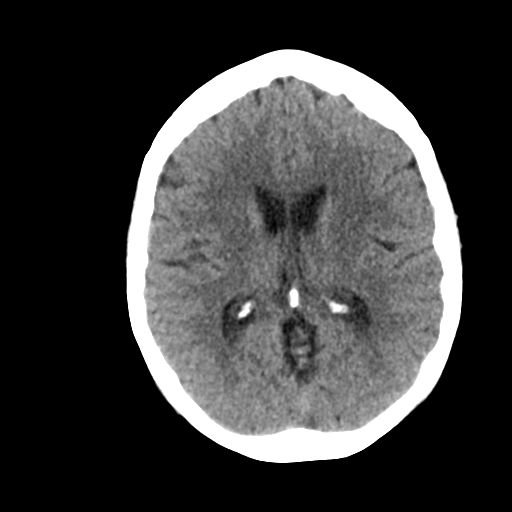
[im 20/32  brain]
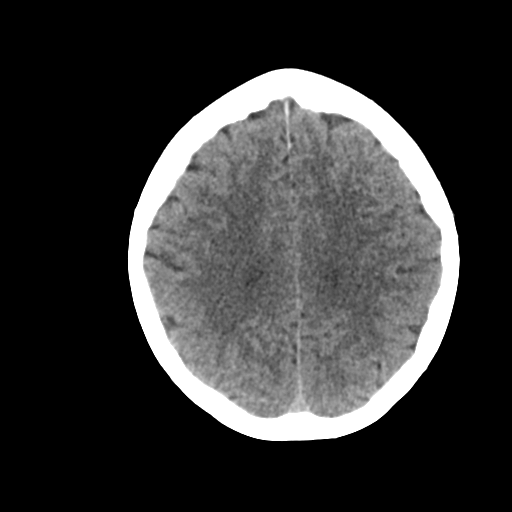
[im 20/32  bone]
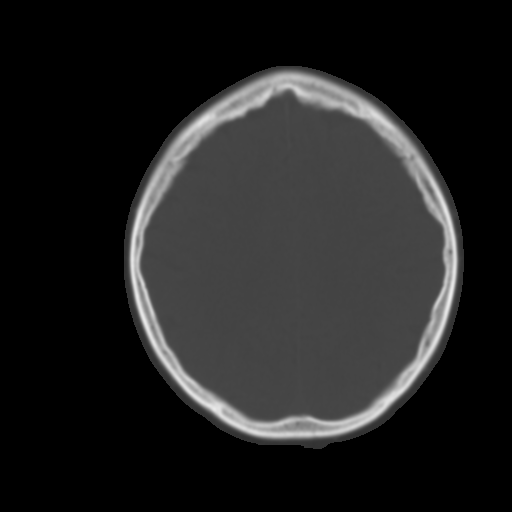
[im 24/32  brain]
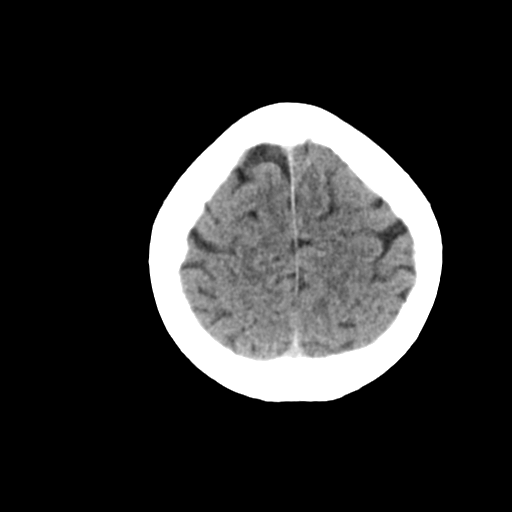
[im 28/32  brain]
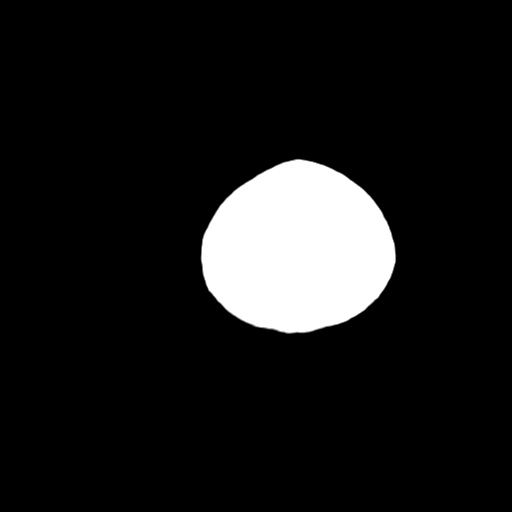

[Series 4: head bone · axial · 0.41mm/px · z∈[-198,-166]mm · 3 of 78 slices shown]
[im 8/78  bone]
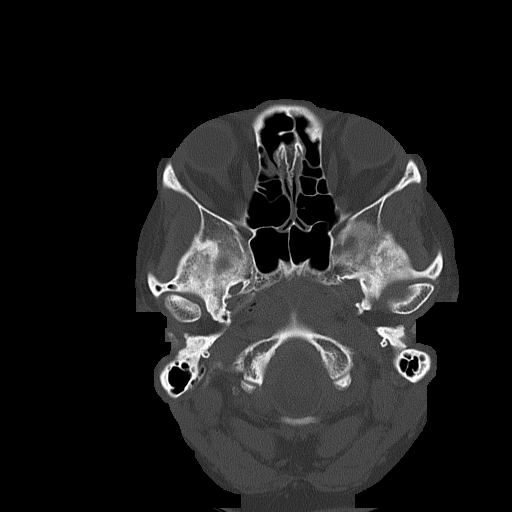
[im 16/78  bone]
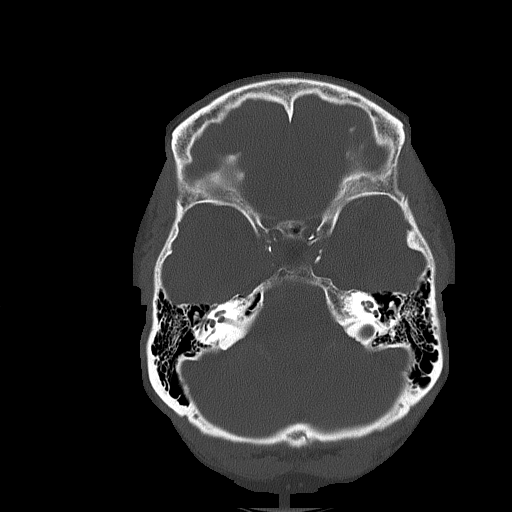
[im 24/78  bone]
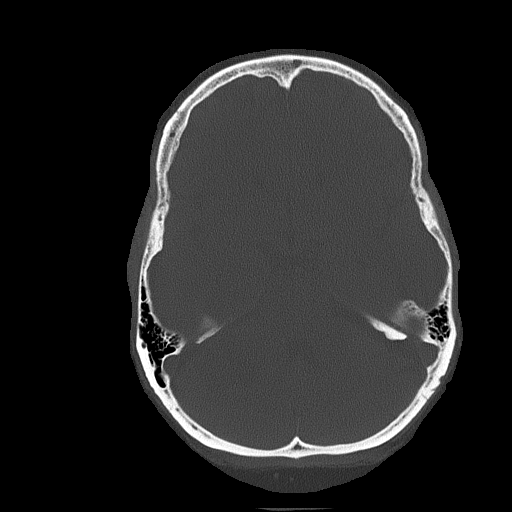

[Series 5: head without cor · coronal · non-contrast · 0.30mm/px · 3 of 67 slices shown]
[im 23/67  brain]
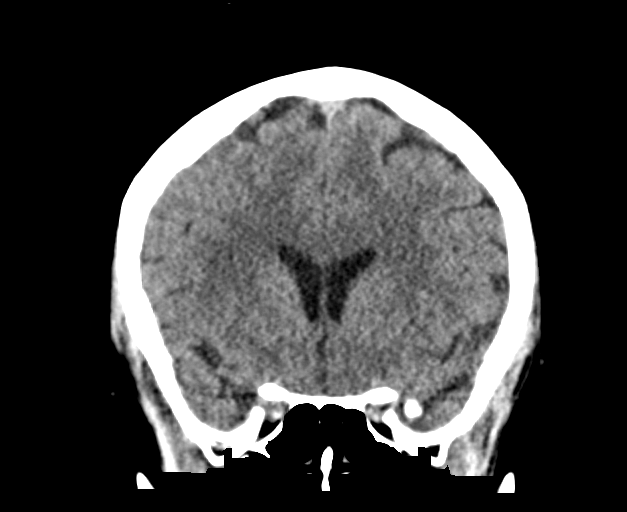
[im 30/67  brain]
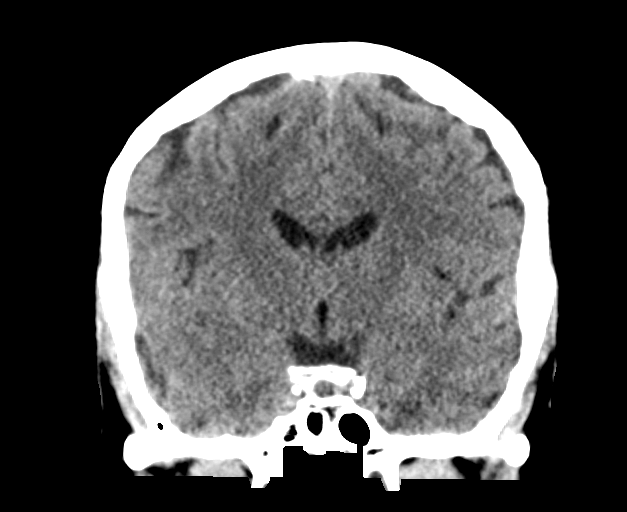
[im 37/67  brain]
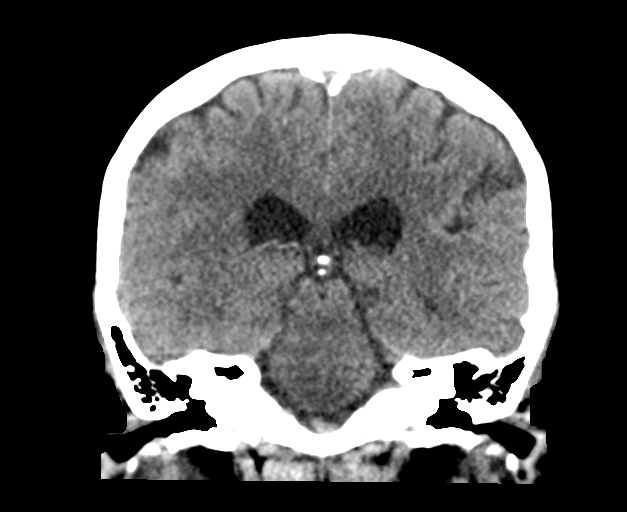

[Series 6: head without sag · sagittal · non-contrast · 0.35mm/px · 3 of 67 slices shown]
[im 23/67  brain]
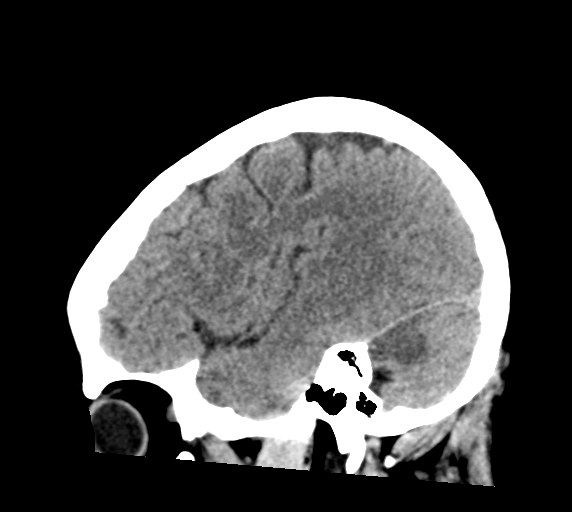
[im 34/67  brain]
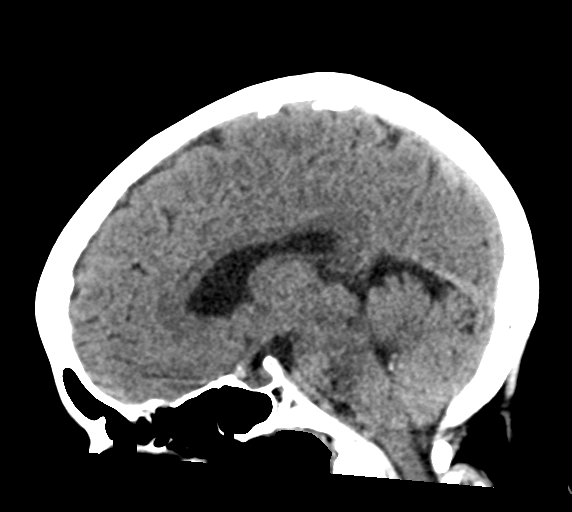
[im 45/67  brain]
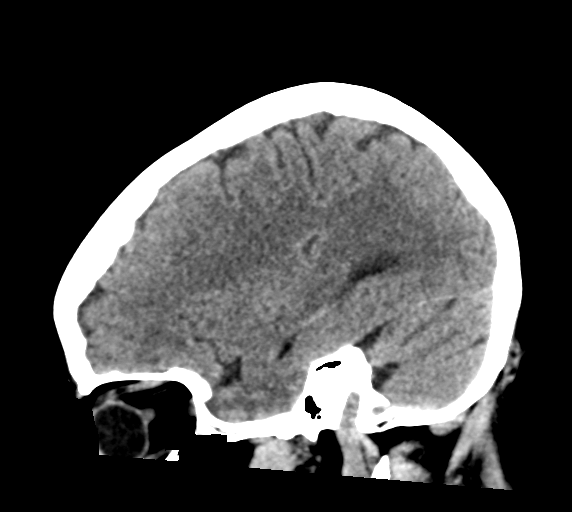

[16 of 47 positions shown; findings below may reference images not displayed]

FINDINGS: Brain: Normal ventricular morphology. No midline shift or mass
effect. Superior RIGHT cerebellar infarct again identified. LEFT
thalamic lacunar infarct seen on prior exam is less well
demonstrated on current study. Remaining brain parenchyma normal
appearance. No intracranial hemorrhage, mass lesion or additional
infarction. No extra-axial fluid collections.

Vascular: Atherosclerotic calcifications of internal carotid
arteries at skull base

Skull: Intact

Sinuses/Orbits: Clear

Other: N/A
IMPRESSION: Again identified RIGHT cerebellar and LEFT thalamic infarcts.

No new intracranial abnormalities.

## 2021-01-26 MED ORDER — ISOSORBIDE MONONITRATE ER 30 MG PO TB24
15.0000 mg | ORAL_TABLET | Freq: Every day | ORAL | Status: DC
  Administered 2021-01-27 – 2021-01-30 (×4): 15 mg via ORAL
  Filled 2021-01-26 (×4): qty 1

## 2021-01-26 NOTE — Progress Notes (Signed)
Inpatient Rehabilitation Admissions Coordinator  I await insurance determination for a possible CIR admit.  Danne Baxter, RN, MSN Rehab Admissions Coordinator [phone] 01/26/2021 12:12 PM

## 2021-01-26 NOTE — Progress Notes (Signed)
Physical Therapy Treatment Patient Details Name: Samantha Huffman MRN: [id number] DOB: 09-14-1955 Today's Date: 01/26/2021    History of Present Illness Pt is a 65 y.o. F who presents 5/31 with dizziness and weakness. 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 Significant PMH: HTN, CABG, epilepsy, nicotine use.    PT Comments    Pt sitting up in bed finishing lunch on entry agreeable to walking with therapy. Pt min guard for bed mobility but noted to have increased difficulty coming to EoB. Once on EoB noted to have difficulty with R UE coordination and vision in particular increased difficulty with finger to nose coordination. Pt min guard for transfers but increased difficulty maintaining balance. Ambulated short distance with RW pt with increasing ataxia and decreasing response to questions. With return to room RN present and also noted impaired finger to nose coordination. Vitals stable. MD present at end of session requested head CT. D/c plans remain appropriate at this time. PT will continue to follow acutely.    Follow Up Recommendations  CIR     Equipment Recommendations  Rolling walker with 5" wheels;3in1 (PT)       Precautions / Restrictions Precautions Precautions: Fall Precaution Comments: ataxia, RLE Restrictions Weight Bearing Restrictions: No    Mobility  Bed Mobility Overal bed mobility: Needs Assistance Bed Mobility: Supine to Sit     Supine to sit: Min guard Sit to supine: Min guard;HOB elevated   General bed mobility comments: decreased coordination with coming to EoB, holding to bed to maintain balance    Transfers Overall transfer level: Needs assistance Equipment used: None Transfers: Sit to/from Stand Sit to Stand: Min guard         General transfer comment: min guard to come to standing, increased difficulty with maintaining balance once in  standing.  Ambulation/Gait Ambulation/Gait assistance: Mod assist Gait Distance (Feet): 20 Feet Assistive device: Rolling walker (2 wheeled) Gait Pattern/deviations: Ataxic;Decreased stride length;Narrow base of support;Step-to pattern;Step-through pattern;Decreased dorsiflexion - right Gait velocity: decreased Gait velocity interpretation: <1.31 ft/sec, indicative of household ambulator General Gait Details: modA for balance with ataxic gait, increased time and processing to follow commands for turning      Modified Rankin (Stroke Patients Only) Modified Rankin (Stroke Patients Only) Pre-Morbid Rankin Score: No symptoms Modified Rankin: Moderately severe disability     Balance Overall balance assessment: Needs assistance Sitting-balance support: Feet supported Sitting balance-Leahy Scale: Good     Standing balance support: Bilateral upper extremity supported Standing balance-Leahy Scale: Poor Standing balance comment: reliant on UE support                            Cognition Arousal/Alertness: Awake/alert;Lethargic (awake/alert on entry, lethargic by end of session) Behavior During Therapy: Flat affect;Impulsive Overall Cognitive Status: Impaired/Different from baseline Area of Impairment: Awareness;Problem solving                         Safety/Judgement: Decreased awareness of safety;Decreased awareness of deficits Awareness: Emergent Problem Solving: Slow processing General Comments: decreased response to questions after getting up to [place] comments (skin integrity, edema, etc.): Pt sitting up in bed on entry, eating, eyes open and conversant, pt with increased difficulty with coming to EoB, holding on bedrail to steady, states she is a little dizzy, asked pt to  sit with hands in lap and pt able, however eyes noted to be closed able to perform neuro exam except has increased difficulty with finger to nose  coordination with R UE, jerking UE all over, pt with inconsistent response to visual field. Pt with increasing lethargy. Pt with ataxic gait. Had RN come assess due to change in awareness. Vital stable. RN noted same ataxic response. MD notified and also had similar response.      Pertinent Vitals/Pain Pain Assessment: No/denies pain           PT Goals (current goals can now be found in the care plan section) Acute Rehab PT Goals Patient Stated Goal: return to baseline PT Goal Formulation: With patient Time For Goal Achievement: 02/04/21 Potential to Achieve Goals: Good Progress towards PT goals: Progressing toward goals    Frequency    Min 4X/week      PT Plan Current plan remains appropriate       AM-PAC PT "6 Clicks" Mobility   Outcome Measure  Help needed turning from your back to your side while in a flat bed without using bedrails?: A Little Help needed moving from lying on your back to sitting on the side of a flat bed without using bedrails?: A Little Help needed moving to and from a bed to a chair (including a wheelchair)?: A Little Help needed standing up from a chair using your arms (e.g., wheelchair or bedside chair)?: A Little Help needed to walk in hospital room?: A Lot Help needed climbing 3-5 steps with a railing? : A Lot 6 Click Score: 16    End of Session Equipment Utilized During Treatment: Gait belt Activity Tolerance: Patient tolerated treatment well;Treatment limited secondary to medical complications (Comment) Patient left: in bed;with call bell/phone within reach;with bed alarm set Nurse Communication: Mobility status PT Visit Diagnosis: Unsteadiness on feet (R26.81);Ataxic gait (R26.0);Difficulty in walking, not elsewhere classified (R26.2)     Time: 9381-0175 PT Time Calculation (min) (ACUTE ONLY): 33 min  Charges:  $Therapeutic Activity: 8-22 mins                     Tnia Anglada B. Migdalia Dk PT, DPT Acute Rehabilitation Services Pager  [phone] Office [phone]    [place] 01/26/2021, 2:54 PM

## 2021-01-26 NOTE — Progress Notes (Signed)
Called to bedside to see patient with abnormal movements after PT.  Movements appeared to be forced.  Will get head CT as per neurology note for change in neurological status.    CT head w/o new finds  Upon going to check on patient again, found be on her cell phone using both hands/arms normally and A+Ox3.  Said she was following up with [employer].  Eulogio Bear DO

## 2021-01-26 NOTE — Progress Notes (Signed)
PROGRESS NOTE    Samantha Huffman  EAV:[id number] DOB: 09/13/1955 DOA: 01/20/2021 PCP: Alroy Dust, L.Marlou Sa, MD   No chief complaint on file.  Brief Narrative:  Samantha Huffman is a 65 y.o. female seen in ed with complaints of  Unsteady gait and right arm and right leg weakness since Sunday. Found to have a CVA.   Assessment & Plan:   Principal Problem:   Acute cerebrovascular accident (CVA) ([place]) Active Problems:   Diabetes mellitus without complication ([place])   Dyslipidemia   Seizures ([place])   CAD (coronary artery disease)   Essential hypertension   Carotid artery stenosis, asymptomatic   Tobacco abuse   Acute CVA (cerebrovascular accident) ([place])  Acute ischemic CVA  3 cm acute infarct right superior cerebellum  Right PICA [place] Infarct  Small Acute Infarct L [place]:  CTA head/neck without emergent LVO, proximal occlusion of right superior cerebellar artery MRA head/neck with mild atherosclerotic disease in carotid bulb bilateral without significant stenosis, mild narroiwing proximal right vertebral artery (2/2 atherosclerotic disease vs dissection - recommended consider CT angio head/neck).  Vertebral arteries patent to the basilar, R PICA not visualized.  Small L PICA.  R superior cerebral artery occluded proximally compatible with infarct.   MRI brain with 3 cm acute infarct right superior cerebellum, smaller acute infarct right PICA territory, small acute infarct left thalamus  DAPT (aspirin 325, plavix x3 months, then aspirin 81/plavix), Lipitor 80 Await CIR placement  Acute Kidney Injury Improved, follow  T2DM Sliding scale insulin Follow A1c 7.1 Accu-Cheks and hypoglycemia protocol.  Dyslipidemia: Continue lipitor   Seizures: seizure precautions. Jodi Marble continued.   CAD:  Continue patient on aspirin and statin therapy. Will resume coreg, and imdur low dose  Hypertension: Resume home meds  Carotid artery stenosis:  Imaging notable for mild  atherosclerotic dz in carotid bulb bilaterally without significant stenosis   Tobacco abuse: Counseling once patient is stable.   DVT prophylaxis: SCD Code Status: full  Family Communication: none at bedside Disposition:   Status is: Inpatient  Remains inpatient appropriate because:Inpatient level of care appropriate due to severity of illness   Dispo: The patient is from: Home              Anticipated d/c is to: pendingCIR?              Patient currently is medically stable to d/c.   Difficult to place patient No       Consultants:   neurology  Procedures: Echo IMPRESSIONS    1. Left ventricular ejection fraction, by estimation, is 60 to 65%. The  left ventricle has normal function. The left ventricle has no regional  wall motion abnormalities. There is mild left ventricular hypertrophy.  Left ventricular diastolic parameters  are consistent with Grade I diastolic dysfunction (impaired relaxation).  2. Right ventricular systolic function is normal. The right ventricular  size is normal. Tricuspid regurgitation signal is inadequate for assessing  PA pressure.  3. The mitral valve is grossly normal. Trivial mitral valve  regurgitation. No evidence of mitral stenosis.  4. The aortic valve is tricuspid. Aortic valve regurgitation is not  visualized. No aortic stenosis is present.  5. The inferior vena cava is normal in size with greater than 50%  respiratory variability, suggesting right atrial pressure of 3 mmHg.   Conclusion(s)/Recommendation(s): No intracardiac source of embolism  detected on this transthoracic study. A transesophageal echocardiogram is  recommended to exclude cardiac source of embolism if clinically indicated.   Subjective: No headache, no  CP  Objective: Vitals:   01/25/21 2008 01/25/21 2314 01/26/21 0421 01/26/21 1218  BP: (!) 153/75 128/79 (!) 149/83 (!) 168/74  Pulse: 75 79 69 75  Resp: 18 17 16 16   Temp: 98.6 F (37 C) 98.3  F (36.8 C) 98.6 F (37 C) 97.8 F (36.6 C)  TempSrc: Oral Oral Oral Oral  SpO2: 96% 94% 99% 100%  Weight:        Intake/Output Summary (Last 24 hours) at 01/26/2021 1332 Last data filed at 01/25/2021 1657 Gross per 24 hour  Intake 250 ml  Output --  Net 250 ml   Filed Weights   01/24/21 0355  Weight: 65.7 kg    Examination:   General: Appearance:     Overweight female in no acute distress     Lungs:     respirations unlabored  Heart:    Normal heart rate. Normal rhythm. No murmurs, rubs, or gallops.   MS:   All extremities are intact.   Neurologic:   Awake, alert    Data Reviewed: I have personally reviewed following labs and imaging studies  CBC: Recent Labs  Lab 01/20/21 1240 01/20/21 1248 01/22/21 0253 01/23/21 0341 01/24/21 0054 01/25/21 0304  WBC 8.1  --  8.4 8.2 7.6 7.8  NEUTROABS 4.7  --  4.3 4.3 3.9 3.6  HGB 16.5* 16.7* 14.8 13.8 13.5 13.0  HCT 49.3* 49.0* 43.8 41.5 39.8 39.0  MCV 95.4  --  94.8 95.6 95.4 96.3  PLT 220  --  183 188 157 657    Basic Metabolic Panel: Recent Labs  Lab 01/21/21 0508 01/22/21 0253 01/23/21 0341 01/24/21 0054 01/25/21 0304  NA 138 138 137 138 142  K 3.9 4.1 3.9 3.9 4.1  CL 108 106 108 110 112*  CO2 23 23 21* 21* 23  GLUCOSE 127* 139* 149* 127* 173*  BUN 14 20 20 23 18   CREATININE 0.98 1.13* 1.20* 1.09* 1.10*  CALCIUM 9.6 9.5 9.4 9.1 9.3  MG  --  2.0 1.9 1.9 1.8  PHOS  --  4.7* 4.4 4.5 4.7*    GFR: Estimated Creatinine Clearance: 44.9 mL/min (A) (by C-G formula based on SCr of 1.1 mg/dL (H)).  Liver Function Tests: Recent Labs  Lab 01/21/21 0508 01/22/21 0253 01/23/21 0341 01/24/21 0054 01/25/21 0304  AST 22 20 22 20 21   ALT 19 18 18 18 20   ALKPHOS 114 106 118 101 102  BILITOT 0.9 0.6 0.5 0.3 0.5  PROT 7.1 7.0 6.5 6.1* 6.3*  ALBUMIN 4.0 3.9 3.8 3.4* 3.5    CBG: Recent Labs  Lab 01/25/21 1039 01/25/21 1556 01/25/21 2117 01/26/21 0653 01/26/21 1105  GLUCAP 142* 101* 160* 154* 135*      Recent Results (from the past 240 hour(s))  Resp Panel by RT-PCR (Flu A&B, Covid) Nasopharyngeal Swab     Status: None   Collection Time: 01/20/21  3:52 PM   Specimen: Nasopharyngeal Swab; Nasopharyngeal(NP) swabs in vial transport medium  Result Value Ref Range Status   SARS Coronavirus 2 by RT PCR NEGATIVE NEGATIVE Final    Comment: (NOTE) SARS-CoV-2 target nucleic acids are NOT DETECTED.  The SARS-CoV-2 RNA is generally detectable in upper respiratory specimens during the acute phase of infection. The lowest concentration of SARS-CoV-2 viral copies this assay can detect is 138 copies/mL. A negative result does not preclude SARS-Cov-2 infection and should not be used as the sole basis for treatment or other patient management decisions. A negative result may occur with  improper specimen collection/handling, submission of specimen other than nasopharyngeal swab, presence of viral mutation(s) within the areas targeted by this assay, and inadequate number of viral copies(<138 copies/mL). A negative result must be combined with clinical observations, patient history, and epidemiological information. The expected result is Negative.  Fact Sheet for Patients:  EntrepreneurPulse.com.au  Fact Sheet for Healthcare Providers:  IncredibleEmployment.be  This test is no t yet approved or cleared by the [place] FDA and  has been authorized for detection and/or diagnosis of SARS-CoV-2 by FDA under an Emergency Use Authorization (EUA). This EUA will remain  in effect (meaning this test can be used) for the duration of the COVID-19 declaration under Section 564(b)(1) of the Act, 21 U.S.C.section 360bbb-3(b)(1), unless the authorization is terminated  or revoked sooner.       Influenza A by PCR NEGATIVE NEGATIVE Final   Influenza B by PCR NEGATIVE NEGATIVE Final    Comment: (NOTE) The Xpert Xpress SARS-CoV-2/FLU/RSV plus assay is intended as  an aid in the diagnosis of influenza from Nasopharyngeal swab specimens and should not be used as a sole basis for treatment. Nasal washings and aspirates are unacceptable for Xpert Xpress SARS-CoV-2/FLU/RSV testing.  Fact Sheet for Patients: EntrepreneurPulse.com.au  Fact Sheet for Healthcare Providers: IncredibleEmployment.be  This test is not yet approved or cleared by the [place] FDA and has been authorized for detection and/or diagnosis of SARS-CoV-2 by FDA under an Emergency Use Authorization (EUA). This EUA will remain in effect (meaning this test can be used) for the duration of the COVID-19 declaration under Section 564(b)(1) of the Act, 21 U.S.C. section 360bbb-3(b)(1), unless the authorization is terminated or revoked.  Performed at [facility], [place] [address], [place], [place] [address]          Radiology Studies: No results found.      Scheduled Meds: .  stroke: mapping our early stages of recovery book   Does not apply Once  .  stroke: mapping our early stages of recovery book   Does not apply Once  . aspirin EC  325 mg Oral Daily  . atorvastatin  80 mg Oral Daily  . carvedilol  3.125 mg Oral BID  . clopidogrel  75 mg Oral Daily  . escitalopram  10 mg Oral QHS  . ezetimibe  10 mg Oral QHS  . insulin aspart  0-15 Units Subcutaneous TID WC  . isosorbide mononitrate  30 mg Oral Daily  . levETIRAcetam  500 mg Oral BID  . pantoprazole  40 mg Oral Daily  . polyethylene glycol  17 g Oral BID  . topiramate  75 mg Oral BID   Continuous Infusions:    LOS: 6 days    Time spent: over 30 min    Geradine Girt, DO [facility]   To contact the attending provider between 7A-7P or the covering provider during after hours 7P-7A, please log into the web site [email or web address] and access using universal [place] password for that web site. If you do not have the password, please call the hospital  operator.  01/26/2021, 1:32 PM

## 2021-01-26 NOTE — Progress Notes (Signed)
Patient placed herself on home CPAP unit for the night  

## 2021-01-27 LAB — GLUCOSE, CAPILLARY
Glucose-Capillary: 147 mg/dL — ABNORMAL HIGH (ref 70–99)
Glucose-Capillary: 141 mg/dL — ABNORMAL HIGH (ref 70–99)
Glucose-Capillary: 103 mg/dL — ABNORMAL HIGH (ref 70–99)
Glucose-Capillary: 148 mg/dL — ABNORMAL HIGH (ref 70–99)

## 2021-01-27 NOTE — Progress Notes (Signed)
Inpatient Rehabilitation Admissions Coordinator  Dr Eliseo Squires has completed Peer to peer with Dr. Tanna Furry. I have sent updated clinicals as per their request. I await further determination for a possible CIR admit.  Danne Baxter, RN, MSN Rehab Admissions Coordinator [phone] 01/27/2021 12:39 PM

## 2021-01-27 NOTE — Progress Notes (Signed)
Assisted pt with added water to chamber of home CPAP.  Pt placed herself on home CPAP unit.

## 2021-01-27 NOTE — Progress Notes (Signed)
Inpatient Rehabilitation Admissions Coordinator  I have received a denial from [facility] for our request for CIR admit. I contacted patient's daughter by phone and she is aware. I gave her the number to file an appeal per her request. Phone [phone]. I contacted Dr Eliseo Squires with number to call for a peer to peer with [employer] MD by 4 pm today. Phone [phone] option 3 to schedule. I will fax appropriate documentation to assist in this appeal process.  Danne Baxter, RN, MSN Rehab Admissions Coordinator [phone] 01/27/2021 8:09 AM

## 2021-01-27 NOTE — Progress Notes (Signed)
Physical Therapy Treatment Patient Details Name: Samantha Huffman MRN: [id number] DOB: 1956/01/13 Today's Date: 01/27/2021    History of Present Illness Pt is a 65 y.o. F who presents 5/31 with dizziness and weakness. 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 On 6/6, pt with increase in symptoms during PT session and was sent for CT that showed no acute abnormality and unchanged previous infarcts. Significant PMH: HTN, CABG, epilepsy, nicotine use.    PT Comments    Pt progressing towards goals. Increased buckling noted in RLE this session and required mod to max A for steadying. Pt with increased difficulty taking backwards steps and demonstrated increased unsteadiness. Reviewed LE HEP and also had pt perform reaching task with RUE to focus on slow, controlled movements. Current recommendations appropriate. Will continue to follow acutely.     Follow Up Recommendations  CIR     Equipment Recommendations  Rolling walker with 5" wheels;3in1 (PT)    Recommendations for Other Services       Precautions / Restrictions Precautions Precautions: Fall Restrictions Weight Bearing Restrictions: No    Mobility  Bed Mobility Overal bed mobility: Needs Assistance Bed Mobility: Supine to Sit     Supine to sit: Min guard     General bed mobility comments: Used bed rails to assist with trunk elevation    Transfers Overall transfer level: Needs assistance Equipment used: Rolling walker (2 wheeled) Transfers: Sit to/from Stand Sit to Stand: Min guard         General transfer comment: Min guard for safety.  Ambulation/Gait Ambulation/Gait assistance: Mod assist;Max assist Gait Distance (Feet): 6 Feet Assistive device: Rolling walker (2 wheeled) Gait Pattern/deviations: Ataxic;Decreased stride length;Narrow base of support;Step-to pattern;Step-through pattern;Decreased dorsiflexion -  right Gait velocity: Decreased   General Gait Details: Pt requiring increased assist this session secondary to RLE buckling. Requiring mod up to max A for balance and had increased difficulty taking backwards steps to chair. Very slow processing noted during gait trials.   Stairs             Wheelchair Mobility    Modified Rankin (Stroke Patients Only) Modified Rankin (Stroke Patients Only) Pre-Morbid Rankin Score: No symptoms Modified Rankin: Moderately severe disability     Balance Overall balance assessment: Needs assistance Sitting-balance support: Feet supported Sitting balance-Leahy Scale: Good     Standing balance support: Bilateral upper extremity supported Standing balance-Leahy Scale: Poor Standing balance comment: Reliant on UE and external support                            Cognition Arousal/Alertness: Awake/alert Behavior During Therapy: Flat affect;Impulsive Overall Cognitive Status: Impaired/Different from baseline Area of Impairment: Awareness;Problem solving;Safety/judgement                         Safety/Judgement: Decreased awareness of safety;Decreased awareness of deficits Awareness: Emergent Problem Solving: Slow processing General Comments: Increased time for processing noted      Exercises General Exercises - Lower Extremity Ankle Circles/Pumps: AROM;Both;10 reps;Supine Heel Slides: AROM;Both;10 reps;Supine Other Exercises Other Exercises: Practiced RUE reaching tasks X5 to practice slow and controlled movements using RW.    General Comments        Pertinent Vitals/Pain Pain Assessment: No/denies pain    Home Living  Prior Function            PT Goals (current goals can now be found in the care plan section) Acute Rehab PT Goals Patient Stated Goal: return to baseline PT Goal Formulation: With patient Time For Goal Achievement: 02/04/21 Potential to Achieve Goals:  Good Progress towards PT goals: Progressing toward goals    Frequency    Min 4X/week      PT Plan Current plan remains appropriate    Co-evaluation              AM-PAC PT "6 Clicks" Mobility   Outcome Measure  Help needed turning from your back to your side while in a flat bed without using bedrails?: A Little Help needed moving from lying on your back to sitting on the side of a flat bed without using bedrails?: A Little Help needed moving to and from a bed to a chair (including a wheelchair)?: A Lot Help needed standing up from a chair using your arms (e.g., wheelchair or bedside chair)?: A Little Help needed to walk in hospital room?: A Lot Help needed climbing 3-5 steps with a railing? : Total 6 Click Score: 14    End of Session Equipment Utilized During Treatment: Gait belt Activity Tolerance: Patient tolerated treatment well;Treatment limited secondary to medical complications (Comment) Patient left: in chair;with call bell/phone within reach;with chair alarm set Nurse Communication: Mobility status PT Visit Diagnosis: Unsteadiness on feet (R26.81);Ataxic gait (R26.0);Difficulty in walking, not elsewhere classified (R26.2)     Time: 7106-2694 PT Time Calculation (min) (ACUTE ONLY): 16 min  Charges:  $Therapeutic Activity: 8-22 mins                     Samantha Huffman, DPT  Acute Rehabilitation Services  Pager: [phone] Office: [phone]    Samantha Huffman 01/27/2021, 4:19 PM

## 2021-01-27 NOTE — Care Management Important Message (Signed)
Important Message  Patient Details  Name: Samantha Huffman MRN: [id number] Date of Birth: 12/16/1955   Medicare Important Message Given:  Yes     Barb Merino [place] 01/27/2021, 1:39 PM

## 2021-01-27 NOTE — Progress Notes (Signed)
PROGRESS NOTE    Samantha Huffman  SAY:[id number] DOB: 1955/09/15 DOA: 01/20/2021 PCP: Alroy Dust, L.Marlou Sa, MD   No chief complaint on file.  Brief Narrative:  Samantha Huffman is a 65 y.o. female seen in ed with complaints of  Unsteady gait and right arm and right leg weakness since Sunday. Found to have a CVA.  Await CIR placement vs SNF pending insurance approval.     Assessment & Plan:   Principal Problem:   Acute cerebrovascular accident (CVA) ([place]) Active Problems:   Diabetes mellitus without complication ([place])   Dyslipidemia   Seizures ([place])   CAD (coronary artery disease)   Essential hypertension   Carotid artery stenosis, asymptomatic   Tobacco abuse   Acute CVA (cerebrovascular accident) ([place])    Acute ischemic CVA  3 cm acute infarct right superior cerebellum  Right PICA [place] Infarct  Small Acute Infarct L [place]:  CTA head/neck without emergent LVO, proximal occlusion of right superior cerebellar artery MRA head/neck with mild atherosclerotic disease in carotid bulb bilateral without significant stenosis, mild narroiwing proximal right vertebral artery (2/2 atherosclerotic disease vs dissection - recommended consider CT angio head/neck).  Vertebral arteries patent to the basilar, R PICA not visualized.  Small L PICA.  R superior cerebral artery occluded proximally compatible with infarct.   MRI brain with 3 cm acute infarct right superior cerebellum, smaller acute infarct right PICA territory, small acute infarct left thalamus  DAPT (aspirin 325, plavix x3 months, then aspirin 81/plavix), Lipitor 80 Await CIR placement  Acute Kidney Injury Improved, follow  T2DM Sliding scale insulin Follow A1c 7.1 Accu-Cheks and hypoglycemia protocol.  Dyslipidemia: Continue lipitor   Seizures: seizure precautions. keppra continued--- says it makes her very sleepy  CAD:  Continue patient on aspirin and statin therapy. Will resume coreg, and imdur low  dose  Hypertension: Resume home meds  Carotid artery stenosis:  Imaging notable for mild atherosclerotic dz in carotid bulb bilaterally without significant stenosis   Tobacco abuse: Counseling once patient is stable.   DVT prophylaxis: SCD Code Status: full  Family Communication: none at bedside Disposition:   Status is: Inpatient  Remains inpatient appropriate because:Inpatient level of care appropriate due to severity of illness   Dispo: The patient is from: Home              Anticipated d/c is to: pending CIR?- did peer to peer-- OT/SLP needed notes sent              Patient currently is medically stable to d/c.   Difficult to place patient No       Consultants:   Neurology  CIR  Procedures: Echo IMPRESSIONS    1. Left ventricular ejection fraction, by estimation, is 60 to 65%. The  left ventricle has normal function. The left ventricle has no regional  wall motion abnormalities. There is mild left ventricular hypertrophy.  Left ventricular diastolic parameters  are consistent with Grade I diastolic dysfunction (impaired relaxation).  2. Right ventricular systolic function is normal. The right ventricular  size is normal. Tricuspid regurgitation signal is inadequate for assessing  PA pressure.  3. The mitral valve is grossly normal. Trivial mitral valve  regurgitation. No evidence of mitral stenosis.  4. The aortic valve is tricuspid. Aortic valve regurgitation is not  visualized. No aortic stenosis is present.  5. The inferior vena cava is normal in size with greater than 50%  respiratory variability, suggesting right atrial pressure of 3 mmHg.   Conclusion(s)/Recommendation(s): No intracardiac source  of embolism  detected on this transthoracic study. A transesophageal echocardiogram is  recommended to exclude cardiac source of embolism if clinically indicated.   Subjective: Says keppra makes her sleepy  Objective: Vitals:   01/27/21 0011  01/27/21 0443 01/27/21 0755 01/27/21 1127  BP: (!) 155/73 (!) 146/67 (!) 152/68 128/73  Pulse: 66 60 66 73  Resp: 18 17 18 18   Temp: 99.1 F (37.3 C) 98.6 F (37 C) 98.3 F (36.8 C) 98.7 F (37.1 C)  TempSrc: Oral Oral Oral Oral  SpO2: 100% 99% 100% 99%  Weight:        Intake/Output Summary (Last 24 hours) at 01/27/2021 1313 Last data filed at 01/27/2021 0100 Gross per 24 hour  Intake --  Output 300 ml  Net -300 ml   Filed Weights   01/24/21 0355  Weight: 65.7 kg    Examination:   General: Appearance:     Overweight female in no acute distress     Lungs:     Clear to auscultation bilaterally, respirations unlabored  Heart:    Normal heart rate. Normal rhythm. No murmurs, rubs, or gallops.   MS:   All extremities are intact.       Data Reviewed: I have personally reviewed following labs and imaging studies  CBC: Recent Labs  Lab 01/22/21 0253 01/23/21 0341 01/24/21 0054 01/25/21 0304  WBC 8.4 8.2 7.6 7.8  NEUTROABS 4.3 4.3 3.9 3.6  HGB 14.8 13.8 13.5 13.0  HCT 43.8 41.5 39.8 39.0  MCV 94.8 95.6 95.4 96.3  PLT 183 188 157 948    Basic Metabolic Panel: Recent Labs  Lab 01/21/21 0508 01/22/21 0253 01/23/21 0341 01/24/21 0054 01/25/21 0304  NA 138 138 137 138 142  K 3.9 4.1 3.9 3.9 4.1  CL 108 106 108 110 112*  CO2 23 23 21* 21* 23  GLUCOSE 127* 139* 149* 127* 173*  BUN 14 20 20 23 18   CREATININE 0.98 1.13* 1.20* 1.09* 1.10*  CALCIUM 9.6 9.5 9.4 9.1 9.3  MG  --  2.0 1.9 1.9 1.8  PHOS  --  4.7* 4.4 4.5 4.7*    GFR: Estimated Creatinine Clearance: 44.9 mL/min (A) (by C-G formula based on SCr of 1.1 mg/dL (H)).  Liver Function Tests: Recent Labs  Lab 01/21/21 0508 01/22/21 0253 01/23/21 0341 01/24/21 0054 01/25/21 0304  AST 22 20 22 20 21   ALT 19 18 18 18 20   ALKPHOS 114 106 118 101 102  BILITOT 0.9 0.6 0.5 0.3 0.5  PROT 7.1 7.0 6.5 6.1* 6.3*  ALBUMIN 4.0 3.9 3.8 3.4* 3.5    CBG: Recent Labs  Lab 01/26/21 1105 01/26/21 1607  01/26/21 2113 01/27/21 0615 01/27/21 1124  GLUCAP 135* 121* 136* 147* 141*     Recent Results (from the past 240 hour(s))  Resp Panel by RT-PCR (Flu A&B, Covid) Nasopharyngeal Swab     Status: None   Collection Time: 01/20/21  3:52 PM   Specimen: Nasopharyngeal Swab; Nasopharyngeal(NP) swabs in vial transport medium  Result Value Ref Range Status   SARS Coronavirus 2 by RT PCR NEGATIVE NEGATIVE Final    Comment: (NOTE) SARS-CoV-2 target nucleic acids are NOT DETECTED.  The SARS-CoV-2 RNA is generally detectable in upper respiratory specimens during the acute phase of infection. The lowest concentration of SARS-CoV-2 viral copies this assay can detect is 138 copies/mL. A negative result does not preclude SARS-Cov-2 infection and should not be used as the sole basis for treatment or other patient management decisions.  A negative result may occur with  improper specimen collection/handling, submission of specimen other than nasopharyngeal swab, presence of viral mutation(s) within the areas targeted by this assay, and inadequate number of viral copies(<138 copies/mL). A negative result must be combined with clinical observations, patient history, and epidemiological information. The expected result is Negative.  Fact Sheet for Patients:  EntrepreneurPulse.com.au  Fact Sheet for Healthcare Providers:  IncredibleEmployment.be  This test is no t yet approved or cleared by the [place] FDA and  has been authorized for detection and/or diagnosis of SARS-CoV-2 by FDA under an Emergency Use Authorization (EUA). This EUA will remain  in effect (meaning this test can be used) for the duration of the COVID-19 declaration under Section 564(b)(1) of the Act, 21 U.S.C.section 360bbb-3(b)(1), unless the authorization is terminated  or revoked sooner.       Influenza A by PCR NEGATIVE NEGATIVE Final   Influenza B by PCR NEGATIVE NEGATIVE Final     Comment: (NOTE) The Xpert Xpress SARS-CoV-2/FLU/RSV plus assay is intended as an aid in the diagnosis of influenza from Nasopharyngeal swab specimens and should not be used as a sole basis for treatment. Nasal washings and aspirates are unacceptable for Xpert Xpress SARS-CoV-2/FLU/RSV testing.  Fact Sheet for Patients: EntrepreneurPulse.com.au  Fact Sheet for Healthcare Providers: IncredibleEmployment.be  This test is not yet approved or cleared by the [place] FDA and has been authorized for detection and/or diagnosis of SARS-CoV-2 by FDA under an Emergency Use Authorization (EUA). This EUA will remain in effect (meaning this test can be used) for the duration of the COVID-19 declaration under Section 564(b)(1) of the Act, 21 U.S.C. section 360bbb-3(b)(1), unless the authorization is terminated or revoked.  Performed at [facility], [place] [address] [place], [place] [address]          Radiology Studies: CT HEAD WO CONTRAST  Result Date: 01/26/2021 CLINICAL DATA:  Neurological deficit, like symptoms EXAM: CT HEAD WITHOUT CONTRAST TECHNIQUE: Contiguous axial images were obtained from the base of the skull through the vertex without intravenous contrast. Sagittal and coronal MPR images reconstructed from axial data set. COMPARISON:  01/23/2021 FINDINGS: Brain: Normal ventricular morphology. No midline shift or mass effect. Superior RIGHT cerebellar infarct again identified. LEFT thalamic lacunar infarct seen on prior exam is less well demonstrated on current study. Remaining brain parenchyma normal appearance. No intracranial hemorrhage, mass lesion or additional infarction. No extra-axial fluid collections. Vascular: Atherosclerotic calcifications of internal carotid arteries at skull base Skull: Intact Sinuses/Orbits: Clear Other: N/A IMPRESSION: Again identified RIGHT cerebellar and LEFT thalamic infarcts. No new intracranial  abnormalities. Electronically Signed   By: Lavonia Dana M.D.   On: 01/26/2021 15:13        Scheduled Meds: .  stroke: mapping our early stages of recovery book   Does not apply Once  .  stroke: mapping our early stages of recovery book   Does not apply Once  . aspirin EC  325 mg Oral Daily  . atorvastatin  80 mg Oral Daily  . carvedilol  3.125 mg Oral BID  . clopidogrel  75 mg Oral Daily  . escitalopram  10 mg Oral QHS  . ezetimibe  10 mg Oral QHS  . insulin aspart  0-15 Units Subcutaneous TID WC  . isosorbide mononitrate  15 mg Oral Daily  . levETIRAcetam  500 mg Oral BID  . pantoprazole  40 mg Oral Daily  . polyethylene glycol  17 g Oral BID  . topiramate  75 mg Oral BID   Continuous Infusions:    LOS: 7 days    Time spent: over 30 min    Geradine Girt, DO [facility]   To contact the attending provider between 7A-7P or the covering provider during after hours 7P-7A, please log into the web site [email or web address] and access using universal [place] password for that web site. If you do not have the password, please call the hospital operator.  01/27/2021, 1:13 PM

## 2021-01-27 NOTE — Progress Notes (Signed)
  Speech Language Pathology Treatment: Cognitive-Linquistic  Patient Details Name: Samantha Huffman MRN: [id number] DOB: [birth year] Today's Date: 01/27/2021 Time: 1210-1225 SLP Time Calculation (min) (ACUTE ONLY): 14.37 min  Assessment / Plan / Recommendation Clinical Impression  Pt much more alert and interactive than during initial assessment.  Demonstrates improved cognition with good awareness of deficits, improved orientation and selective attention, improved working memory and insight. She is able to engage in appropriate conversation and turn-taking today.  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 She acknowledges that speech and processing is a bit slower.  Pt required min verbal cues overall for speech/higher level cognitive tasks.  Remains an excellent candidate for CIR.    HPI HPI: Pt is a 65 y.o. F who presents 5/31 with dizziness and weakness. 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 Significant PMH: HTN, CABG, epilepsy, nicotine use.      SLP Plan  Continue with current plan of care       Recommendations                   Oral Care Recommendations: Oral care BID Follow up Recommendations: Inpatient Rehab SLP Visit Diagnosis: Cognitive communication deficit (G89.169) Plan: Continue with current plan of care       GO                Juan Quam Laurice 01/27/2021, 12:26 PM Ardice Boyan L. Tivis Ringer, Advance Office number [phone] Pager [phone]

## 2021-01-28 DIAGNOSIS — E119 Type 2 diabetes mellitus without complications: Secondary | ICD-10-CM

## 2021-01-28 DIAGNOSIS — I2510 Atherosclerotic heart disease of native coronary artery without angina pectoris: Secondary | ICD-10-CM

## 2021-01-28 DIAGNOSIS — Z720 Tobacco use: Secondary | ICD-10-CM

## 2021-01-28 DIAGNOSIS — E785 Hyperlipidemia, unspecified: Secondary | ICD-10-CM

## 2021-01-28 LAB — GLUCOSE, CAPILLARY
Glucose-Capillary: 147 mg/dL — ABNORMAL HIGH (ref 70–99)
Glucose-Capillary: 151 mg/dL — ABNORMAL HIGH (ref 70–99)
Glucose-Capillary: 78 mg/dL (ref 70–99)
Glucose-Capillary: 115 mg/dL — ABNORMAL HIGH (ref 70–99)

## 2021-01-28 MED ORDER — LISINOPRIL 20 MG PO TABS
20.0000 mg | ORAL_TABLET | Freq: Every day | ORAL | Status: DC
  Administered 2021-01-28 – 2021-01-30 (×3): 20 mg via ORAL
  Filled 2021-01-28 (×3): qty 1

## 2021-01-28 NOTE — PMR Pre-admission (Signed)
PMR Admission Coordinator Pre-Admission Assessment  Patient: Samantha Huffman is an 65 y.o., female MRN: [id number] DOB: 1956-06-30 Height:   Weight: 65.7 kg              Insurance Information HMO:     PPO: yes     PCP:      IPA:      80/20:      OTHER:  PRIMARY: [employer] Medicare      Policy#: [id number]      Subscriber: pt CM Name: Dyann Kief     Phone#: [phone] option 5    Fax#: [phone] I received a call from [facility] appeals stating Pt. Was approved 6/9-6/13 on 6/10 and 8:40AM. Clinical updates are due to Case Manager Dyann Kief. Pre-Cert#: [id number]      Employer:  Benefits:  Phone #: [phone]     Name: 6/8 Eff. Date: 08/23/2020     Deduct: $233      Out of Pocket Max: $2500      Life Max: none  CIR: 100%      SNF: 100% Outpatient: $20 copay per visit     Co-Pay: visits per medcial neccesity Home Health: 100%      Co-Pay: visits per medcial necceisty DME: 100%     Co-Pay: none Providers: in network  SECONDARY: none      Policy#:       Phone#:   Development worker, community:       Phone#:   The Engineer, petroleum" for patients in Inpatient Rehabilitation Facilities with attached "Privacy Act [place] Records" was provided and verbally reviewed with: Patient and Family  Emergency Contact Information Contact Information     Name Relation Home Work [place] Daughter [phone]  [phone]      Current Medical History  Patient Admitting Diagnosis: CVA  History of Present Illness:  65 y.o. female is a with history of HTN, CAD s/p CABG, T2DM, seizures, migraines, exc bilateral cataracts in the past month;  who was admitted on 01/20/21 with reports of RUE/RLE weakness and unsteady gait for 2-3 days. She reported having lifted a heavy case of water the day before and started feeling dizzy. CTA head/neck showed proximal occlusion of R-SCA and no LVO.  MRI brain done revealing acute/subacute right cerebellar stroke and smaller infarct  right PICA and left thalamus.   2D echo showed EF 60-65% with trivial MVR.  Dr. Erlinda Hong question vertebral dissection as cause of stroke and recommended repeating CT head in 2 days to r/o hydrocephalus and to increase DAPT to ASA 325 mg/Plavix 75 X 3 months followed by lower ASA dose. Therapy evaluations completed revealing ataxic gait with balance deficits,  decreased coordination on the right as well as tachycardia with activity. CIR recommended due to functional decline.   Complete NIHSS TOTAL: 2 Glasgow Coma Scale Score: 15  Past Medical History  Past Medical History:  Diagnosis Date   Carotid artery stenosis    1-39% bilateral stenosis by dopplers 08/2020   Coronary artery disease    a. s/p CABG 10/2012.  cath 05/2016 with moderate LM stenosis, mild LCx and RCA stenosis and occluded LAD with patent LIMA to LAD, free radial to OM.  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   Dyslipidemia    Family history of adverse reaction to anesthesia    mother and sister ponv   Generalized convulsive epilepsy without mention of intractable epilepsy    GERD (gastroesophageal reflux disease)    History  of kidney stones    Hypertension    Memory deficit    slight   Migraine    "controlled on daily RX " (06/11/2016)   Nephrolithiasis 07/30/2015   S/p lithotripsy x 3 and right and left ureter stents   OSA (obstructive sleep apnea)    cpap    PONV (postoperative nausea and vomiting)    Seizures ([place])    "controlled w/daily RX; started in 2012; dr thinks they might be from the migraines; last sz was early part of 2016" (06/11/2016)   Type II diabetes mellitus ([place])    metformin   Vasovagal syncope 07/30/2015   2011    Family History  family history includes Cancer in her father; Diabetes in her brother, brother, and father; Heart attack in her sister; Heart disease in her brother and sister; Stroke in her brother.  Prior Rehab/Hospitalizations:  Has the patient had prior rehab or hospitalizations  prior to admission? Yes  Has the patient had major surgery during 100 days prior to admission? No  Current Medications   Current Facility-Administered Medications:     stroke: mapping our early stages of recovery book, , Does not apply, Once, Para Skeans, MD    stroke: mapping our early stages of recovery book, , Does not apply, Once, Lora Havens, MD   acetaminophen (TYLENOL) tablet 650 mg, 650 mg, Oral, Q4H PRN, Para Skeans, MD   aspirin EC tablet 325 mg, 325 mg, Oral, Daily, Rosalin Hawking, MD, 325 mg at 01/29/21 0939   atorvastatin (LIPITOR) tablet 80 mg, 80 mg, Oral, Daily, Florina Ou V, MD, 80 mg at 01/29/21 0939   carvedilol (COREG) tablet 3.125 mg, 3.125 mg, Oral, BID, Elodia Florence., MD, 3.125 mg at 01/29/21 0940   clopidogrel (PLAVIX) tablet 75 mg, 75 mg, Oral, Daily, Lora Havens, MD, 75 mg at 01/29/21 0939   escitalopram (LEXAPRO) tablet 10 mg, 10 mg, Oral, QHS, Florina Ou V, MD, 10 mg at 01/28/21 2334   ezetimibe (ZETIA) tablet 10 mg, 10 mg, Oral, QHS, Florina Ou V, MD, 10 mg at 01/28/21 2334   insulin aspart (novoLOG) injection 0-15 Units, 0-15 Units, Subcutaneous, TID WC, Florina Ou V, MD, 8 Units at 01/29/21 1116   isosorbide mononitrate (IMDUR) 24 hr tablet 15 mg, 15 mg, Oral, Daily, Vann, Jessica U, DO, 15 mg at 01/29/21 0940   labetalol (NORMODYNE) injection 5 mg, 5 mg, Intravenous, Q10 min PRN, Florina Ou V, MD, 5 mg at 01/20/21 2023   levETIRAcetam (KEPPRA) tablet 500 mg, 500 mg, Oral, BID, Florina Ou V, MD, 500 mg at 01/29/21 1116   lisinopril (ZESTRIL) tablet 20 mg, 20 mg, Oral, Daily, Eugenie Filler, MD, 20 mg at 01/29/21 0940   pantoprazole (PROTONIX) EC tablet 40 mg, 40 mg, Oral, Daily, Florina Ou V, MD, 40 mg at 01/29/21 0939   polyethylene glycol (MIRALAX / GLYCOLAX) packet 17 g, 17 g, Oral, BID, Elodia Florence., MD, 17 g at 01/29/21 0941   sodium bicarbonate tablet 650 mg, 650 mg, Oral, BID, Eugenie Filler, MD, 650 mg at  01/29/21 9242   topiramate (TOPAMAX) tablet 75 mg, 75 mg, Oral, BID, Para Skeans, MD, 75 mg at 01/29/21 6834  Patients Current Diet:  Diet Order             Diet heart healthy/carb modified Room service appropriate? Yes; Fluid consistency: Thin  Diet effective now  Precautions / Restrictions Precautions Precautions: Fall Precaution Comments: ataxia, RLE/RUE Restrictions Weight Bearing Restrictions: No   Has the patient had 2 or more falls or a fall with injury in the past year?No  Prior Activity Level Limited Community (1-2x/wk): Independent  Prior Functional Level Prior Function Level of Independence: Independent  Self Care: Did the patient need help bathing, dressing, using the toilet or eating?  Independent  Indoor Mobility: Did the patient need assistance with walking from room to room (with or without device)? Independent  Stairs: Did the patient need assistance with internal or external stairs (with or without device)? Independent  Functional Cognition: Did the patient need help planning regular tasks such as shopping or remembering to take medications? Independent  Home Assistive Devices / Equipment Home Assistive Devices/Equipment: None Home Equipment: None  Prior Device Use: Indicate devices/aids used by the patient prior to current illness, exacerbation or injury? None of the above  Current Functional Level Cognition  Arousal/Alertness: Awake/alert Overall Cognitive Status: Impaired/Different from baseline Orientation Level: Oriented X4 Safety/Judgement: Decreased awareness of safety, Decreased awareness of deficits General Comments: Increased time to answer questions and follow commands Attention: Sustained Sustained Attention: Impaired Sustained Attention Impairment: Verbal basic Memory: Impaired Memory Impairment: Storage deficit Awareness: Impaired Awareness Impairment: Emergent impairment Problem Solving: Impaired Problem  Solving Impairment: Verbal basic    Extremity Assessment (includes Sensation/Coordination)  Upper Extremity Assessment: Generalized weakness, RUE deficits/detail, LUE deficits/detail RUE Deficits / Details: strength 3+/5; ataxia wtih movements; compensating with large shoulder movements attempting to to control [facility] RUE Coordination: decreased fine motor, decreased gross motor LUE Deficits / Details: strength 4/5  Lower Extremity Assessment: Defer to PT evaluation, RLE deficits/detail RLE Deficits / Details: ataxia; knee buckling RLE Coordination: decreased gross motor LLE Deficits / Details: Strength 5/5    ADLs  Overall ADL's : Needs assistance/impaired Eating/Feeding: Supervision/ safety, Set up, Sitting Eating/Feeding Details (indicate cue type and reason): sitting upright in bed, pt eating cereal with bananas, pt able to cut up each banana slice with increased time, poor depth perception noted with ataxic movements in RUE Grooming: Min guard, Standing Grooming Details (indicate cue type and reason): standing x3 mins for task Upper Body Bathing: Min guard, Sitting, Cueing for safety, Cueing for sequencing Upper Body Bathing Details (indicate cue type and reason): Pt with cues to hold on with single UE for support Lower Body Bathing: Supervison/ safety, Set up, Sitting/lateral leans Lower Body Bathing Details (indicate cue type and reason): simulated via posterior pericare Upper Body Dressing : Minimal assistance, Sitting Lower Body Dressing: Minimal assistance, Moderate assistance, Sitting/lateral leans, Cueing for safety Toilet Transfer: Minimal assistance, RW, Stand-pivot, BSC Toilet Transfer Details (indicate cue type and reason): MIN A for steadying assist to pivot from recliner>BSC>EOB Toileting- Clothing Manipulation and Hygiene: Supervision/safety, Set up, Sitting/lateral lean Toileting - Clothing Manipulation Details (indicate cue type and reason): posterior  pericare Functional mobility during ADLs: Minimal assistance, Rolling walker General ADL Comments: pt continues to present with impaired RUE MFC, impaired motor planning and atxia in RLE and RUE    Mobility  Overal bed mobility: Needs Assistance Bed Mobility: Supine to Sit Supine to sit: Supervision Sit to supine: Min guard, HOB elevated General bed mobility comments: Supervision for safety with use of bed rails.    Transfers  Overall transfer level: Needs assistance Equipment used: Rolling walker (2 wheeled) Transfers: Sit to/from Stand Sit to Stand: Min guard Stand pivot transfers: Min assist General transfer comment: Min guard for safety. Required cues for safe  hand placement.    Ambulation / Gait / Stairs / Wheelchair Mobility  Ambulation/Gait Ambulation/Gait assistance: Min assist, Mod assist, Min guard Gait Distance (Feet): 100 Feet Assistive device: Rolling walker (2 wheeled) Gait Pattern/deviations: Ataxic, Decreased stride length, Narrow base of support, Step-to pattern, Step-through pattern, Decreased dorsiflexion - right General Gait Details: Slow, cautious gait. Initially requiring min A for steadying. As pt fatigued, pt with more notable ataxia in RLE and required up to mod A for steadying. Gait velocity: Decreased Gait velocity interpretation: <1.31 ft/sec, indicative of household ambulator    Posture / Balance Balance Overall balance assessment: Needs assistance Sitting-balance support: Feet supported Sitting balance-Leahy Scale: Good Standing balance support: Bilateral upper extremity supported Standing balance-Leahy Scale: Poor Standing balance comment: Reliant on UE and external support    Special needs/care consideration CPAP at home Hgb A1c 7.1     Previous Home Environment  Living Arrangements: Alone  Lives With: Alone Available Help at Discharge: Family, Available 24 hours/day Type of Home: Apartment Home Layout: One level Home Access: Level  entry Bathroom Shower/Tub: Chiropodist: Standard Bathroom Accessibility: Yes How Accessible: Accessible via walker Home Care Services: No Additional Comments: lived alone in apartment  Discharge Living Setting Plans for Discharge Living Setting: Apartment, Alone Type of Home at Discharge: Apartment Discharge Home Layout: One level Discharge Home Access: Level entry Discharge Bathroom Shower/Tub: Tub/shower unit Discharge Bathroom Toilet: Standard Discharge Bathroom Accessibility: Yes How Accessible: Accessible via walker Does the patient have any problems obtaining your medications?: No  Social/Family/Support Systems Contact Information: daughter, [name] Anticipated Caregiver: daughter Anticipated Caregiver's Contact Information: [phone] Ability/Limitations of Caregiver: daughter works from home Caregiver Availability: 24/7 Discharge Plan Discussed with Primary Caregiver: Yes Is Caregiver In Agreement with Plan?: Yes Does Caregiver/Family have Issues with Lodging/Transportation while Pt is in Rehab?: No  Daughter has sold her home and is moving out 6/30. Closing on her new home in 2 weeks after that. Daughter aware that 24/7 supervision is recommended after a CIR admit and she states she will arrange. Daughter works from home.  Goals Patient/Family Goal for Rehab: supervision to min asisst with PT and OT Expected length of stay: ELOS 10 to 14 days Program Orientation Provided & Reviewed with Pt/Caregiver Including Roles  & Responsibilities: Yes  Decrease burden of Care through IP rehab admission: n/a  Possible need for SNF placement upon discharge:not antiicapted  Patient Condition: This patient's medical and functional status has changed since the consult dated: 01/22/2021 in which the Rehabilitation Physician determined and documented that the patient's condition is appropriate for intensive rehabilitative care in an inpatient rehabilitation facility.  See "History of Present Illness" (above) for medical update. Functional changes are Pt ambulating 100 ft with mod A-min G assist. Patient's medical and functional status update has been discussed with the Rehabilitation physician and patient remains appropriate for inpatient rehabilitation. Will admit to inpatient rehab today.  Preadmission Screen Completed By: Danne Baxter RN MSN with updates by Julious Payer, Audelia Acton, RN, 01/29/2021 2:35 PM ______________________________________________________________________   Discussed status with Dr. Naaman Plummer on 01/30/2021 at 945 and received approval for admission today.  Admission Coordinator: Danne Baxter RN MSN with updates by Cleatrice Burke,  with updates by Clemens Catholic, [place] CCC-SLP time 1000 Sudie Grumbling 01/30/2021

## 2021-01-28 NOTE — Progress Notes (Signed)
Pt's CPAP set up on bedside table by this RT and water chamber refilled per pt's request. Advised pt to notify for RT if any further assistance is needed.

## 2021-01-28 NOTE — Psychosocial Assessment (Signed)
Inpatient Rehabilitation Admissions Coordinator  I have received a denial from [facility] after peer to peer with Dr. Eliseo Squires yesterday. I have contacted pt's daughter by phon and she is initiating appeal. I will inform patient. Acute team and TOC made aware.  Danne Baxter, RN, MSN Rehab Admissions Coordinator [phone] 01/28/2021 10:01 AM

## 2021-01-28 NOTE — Progress Notes (Signed)
Physical Therapy Treatment Patient Details Name: Samantha Huffman MRN: [id number] DOB: 11-16-1955 Today's Date: 01/28/2021    History of Present Illness Pt is a 65 y.o. F who presents 5/31 with dizziness and weakness. 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 On 6/6, pt with increase in symptoms during PT session and was sent for CT that showed no acute abnormality and unchanged previous infarcts. Significant PMH: HTN, CABG, epilepsy, nicotine use.    PT Comments    Pt agreeable to therapy, however requests use of BSC before ambulation. Pt is min guard for bed mobility and minA for steadying with pivot to [facility] Pt continues to be limited in safe mobility from bouts of ataxic movement. Pt able to progress her mobility but needs up to modA for steadying with ataxia, otherwise ambulates with light minA.  Pt educated on need for gaze stabilization which seems to help. Given the complexity of pt neurologic involvement PT continues to recommend CIR level therapies to progress safety with mobility.    Follow Up Recommendations  CIR     Equipment Recommendations  Rolling walker with 5" wheels;3in1 (PT)       Precautions / Restrictions Precautions Precautions: Fall Restrictions Weight Bearing Restrictions: No    Mobility  Bed Mobility Overal bed mobility: Needs Assistance Bed Mobility: Supine to Sit     Supine to sit: Min guard     General bed mobility comments: Used bed rails to assist with trunk elevation    Transfers Overall transfer level: Needs assistance Equipment used: Rolling walker (2 wheeled);None Transfers: Sit to/from Stand Sit to Stand: Min guard Stand pivot transfers: Min guard       General transfer comment: Min guard for safety.  Ambulation/Gait Ambulation/Gait assistance: Mod assist;Min guard Gait Distance (Feet): 100 Feet (1x 25, 1x100, 1x25) Assistive device: Rolling  walker (2 wheeled) Gait Pattern/deviations: Ataxic;Decreased stride length;Narrow base of support;Step-to pattern;Step-through pattern;Decreased dorsiflexion - right Gait velocity: Decreased Gait velocity interpretation: <1.31 ft/sec, indicative of household ambulator General Gait Details: Pt is minA for ambulation with bouts of ataxia UE>LE that requires modA for steadying, vc for proximity to RW and keeping gaze up and out      Modified Rankin (Stroke Patients Only) Modified Rankin (Stroke Patients Only) Pre-Morbid Rankin Score: No symptoms Modified Rankin: Moderately severe disability     Balance Overall balance assessment: Needs assistance Sitting-balance support: Feet supported Sitting balance-Leahy Scale: Good     Standing balance support: Bilateral upper extremity supported Standing balance-Leahy Scale: Poor Standing balance comment: Reliant on UE and external support                            Cognition Arousal/Alertness: Awake/alert Behavior During Therapy: Flat affect;Impulsive Overall Cognitive Status: Impaired/Different from baseline Area of Impairment: Awareness;Problem solving;Safety/judgement                         Safety/Judgement: Decreased awareness of safety;Decreased awareness of deficits Awareness: Emergent Problem Solving: Slow processing General Comments: continues to need increased time for processing and command follow         General Comments General comments (skin integrity, edema, etc.): VSS on RA,      Pertinent Vitals/Pain Pain Assessment: No/denies pain           PT Goals (current goals can now be found in the care plan section) Acute  Rehab PT Goals Patient Stated Goal: return to baseline PT Goal Formulation: With patient Time For Goal Achievement: 02/04/21 Potential to Achieve Goals: Good Progress towards PT goals: Progressing toward goals    Frequency    Min 4X/week      PT Plan Current plan  remains appropriate       AM-PAC PT "6 Clicks" Mobility   Outcome Measure  Help needed turning from your back to your side while in a flat bed without using bedrails?: A Little Help needed moving from lying on your back to sitting on the side of a flat bed without using bedrails?: A Little Help needed moving to and from a bed to a chair (including a wheelchair)?: A Lot Help needed standing up from a chair using your arms (e.g., wheelchair or bedside chair)?: A Little Help needed to walk in hospital room?: A Lot Help needed climbing 3-5 steps with a railing? : Total 6 Click Score: 14    End of Session Equipment Utilized During Treatment: Gait belt Activity Tolerance: Patient tolerated treatment well;Treatment limited secondary to medical complications (Comment) Patient left: in chair;with call bell/phone within reach;with chair alarm set Nurse Communication: Mobility status PT Visit Diagnosis: Unsteadiness on feet (R26.81);Ataxic gait (R26.0);Difficulty in walking, not elsewhere classified (R26.2)     Time: 1610-9604 PT Time Calculation (min) (ACUTE ONLY): 28 min  Charges:  $Gait Training: 8-22 mins $Therapeutic Activity: 8-22 mins                     Shaela Boer B. Migdalia Dk PT, DPT Acute Rehabilitation Services Pager [phone] Office [phone]    [place] 01/28/2021, 10:31 AM

## 2021-01-28 NOTE — Progress Notes (Signed)
Occupational Therapy Treatment Patient Details Name: Samantha Huffman MRN: [id number] DOB: 02-22-1956 Today's Date: 01/28/2021    History of present illness Pt is a 65 y.o. F who presents 5/31 with dizziness and weakness. 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 On 6/6, pt with increase in symptoms during PT session and was sent for CT that showed no acute abnormality and unchanged previous infarcts. Significant PMH: HTN, CABG, epilepsy, nicotine use.   OT comments  Pt asleep in recliner upon arrival needing multimodal cues to arouse. Pt lethargic majority of session but did arouse more as session progressed. Pt continues to present with impaired motor planning in RUE, impaired balance, and impaired [place] in RUE. Pt currently requires [place] for functional mobility with RW and supervision for LB ADLS. Pt with  difficulties with self feeding needing increased time and effort to complete hand to mouth pattern with RUE, mild dysmetria noted. Pt does endorse blurry vision in R eye when L eye occluded, but overall WFL during session ( will continue to assess vision).  Noted family appealing CIR denial, continue to recommend comprehensive intensive inpatient rehab to address below deficits. Will follow acutely per POC.    Follow Up Recommendations  CIR    Equipment Recommendations  3 in 1 bedside commode    Recommendations for Other Services      Precautions / Restrictions Precautions Precautions: Fall Precaution Comments: ataxia, RLE/RUE Restrictions Weight Bearing Restrictions: No       Mobility Bed Mobility Overal bed mobility: Needs Assistance Bed Mobility: Sit to Supine      Sit to supine: Min guard;HOB elevated   General bed mobility comments: Used bed rails, min guard for safety    Transfers Overall transfer level: Needs assistance Equipment used: Rolling walker (2 wheeled) Transfers: Sit  to/from [facility] Sit to Stand: Min guard Stand pivot transfers: Min assist       General transfer comment: min guard to rise from recliner and BSC, cues for hand placement although pt preferring to pull up on RW. pt requried MIN A to pivot needing steadying assist d/t ataxic gait    Balance Overall balance assessment: Needs assistance Sitting-balance support: Feet supported Sitting balance-Leahy Scale: Good     Standing balance support: Bilateral upper extremity supported Standing balance-Leahy Scale: Poor Standing balance comment: Reliant on UE and external support                           ADL either performed or assessed with clinical judgement   ADL Overall ADL's : Needs assistance/impaired Eating/Feeding: Set up;Sitting;Bed level Eating/Feeding Details (indicate cue type and reason): supported in bed, pt self feeding with RUE slow and effortful to complete hand to mouth pattern         Lower Body Bathing: Supervison/ safety;Set up;Sitting/lateral leans Lower Body Bathing Details (indicate cue type and reason): simulated via posterior pericare         Toilet Transfer: Minimal assistance;RW;Stand-pivot;BSC Toilet Transfer Details (indicate cue type and reason): MIN A for steadying assist to pivot from recliner>BSC>EOB Toileting- Clothing Manipulation and Hygiene: Supervision/safety;Set up;Sitting/lateral lean Toileting - Clothing Manipulation Details (indicate cue type and reason): posterior pericare     Functional mobility during ADLs: Minimal assistance;Rolling walker General ADL Comments: pt continues to present with impaired RUE MFC, impaired motor planning and atxia in RLE and RUE     Vision Baseline  Vision/History: Wears glasses;Cataracts (cataract surgey in both eyes) Wears Glasses: Reading only Patient Visual Report: Blurring of vision;Other (comment) (pt reports blurry vision in R eye when occluding L eye) Vision Assessment?:  Vision impaired- to be further tested in functional context;Yes Eye Alignment: Within Functional Limits Ocular Range of Motion: Within Functional Limits Alignment/Gaze Preference: Within Defined Limits Tracking/Visual Pursuits: Able to track stimulus in all quads without difficulty Saccades: Within functional limits Convergence: Within functional limits Visual Fields: No apparent deficits Additional Comments: doesn't necesarily overshoot/ undershoot but has imparied motor planning when reaching for objects or completing hand to nose test, very slow and uncoordinated movements   Perception     Praxis      Cognition Arousal/Alertness: Lethargic (pt asleep upon arrival and limited  by lethagy throuhgout session needing cues for pt to keep eyes open during session) Behavior During Therapy: Flat affect;Impulsive Overall Cognitive Status: Impaired/Different from baseline Area of Impairment: Awareness;Problem solving;Safety/judgement                         Safety/Judgement: Decreased awareness of safety;Decreased awareness of deficits Awareness: Emergent Problem Solving: Slow processing General Comments: pt very lethargic this session needing increased time and effort to follow command and attend to task        Exercises Hand Exercises Opposition: AROM;Right (3 reps, very slow and effortful) Other Exercises Other Exercises: pt completed x3 reps of R hand from nose to OTAs hand, pt with ataxic, uncoordinated movements in RUE   Shoulder Instructions       General Comments VSS on RA    Pertinent Vitals/ Pain       Pain Assessment: No/denies pain  Home Living                                          Prior Functioning/Environment              Frequency  Min 2X/week        Progress Toward Goals  OT Goals(current goals can now be found in the care plan section)  Progress towards OT goals: Progressing toward goals  Acute Rehab OT  Goals Patient Stated Goal: go to rehab OT Goal Formulation: With patient Time For Goal Achievement: 02/04/21 Potential to Achieve Goals: Good  Plan Discharge plan remains appropriate;Frequency remains appropriate    Co-evaluation                 AM-PAC OT "6 Clicks" Daily Activity     Outcome Measure   Help from another person eating meals?: A Little Help from another person taking care of personal grooming?: A Little Help from another person toileting, which includes using toliet, bedpan, or urinal?: A Little Help from another person bathing (including washing, rinsing, drying)?: A Little Help from another person to put on and taking off regular upper body clothing?: A Little Help from another person to put on and taking off regular lower body clothing?: A Little 6 Click Score: 18    End of Session Equipment Utilized During Treatment: Gait belt;Rolling walker  OT Visit Diagnosis: Unsteadiness on feet (R26.81);Muscle weakness (generalized) (M62.81);Other symptoms and signs involving cognitive function   Activity Tolerance Patient tolerated treatment well   Patient Left in bed;with call bell/phone within reach;with bed alarm set;Other (comment) (eating lunch)   Nurse Communication Mobility status  Time: 0263-7858 OT Time Calculation (min): 21 min  Charges: OT General Charges $OT Visit: 1 Visit OT Treatments $Self Care/Home Management : 8-22 mins  Harley Alto., COTA/L Acute Rehabilitation Services [phone]    Precious Haws 01/28/2021, 12:05 PM

## 2021-01-28 NOTE — Progress Notes (Signed)
PROGRESS NOTE    Samantha Huffman  XMI:[id number] DOB: [birth year] DOA: 01/20/2021 PCP: Alroy Dust, L.Marlou Sa, MD   No chief complaint on file.   Brief Narrative:  Samantha Huffman a 65 y.o.femaleseen in ed with complaints ofUnsteady gait and right arm and right leg weakness since Sunday. Found to have a CVA.  Awaiting CIR placement vs SNF pending insurance approval.    Assessment & Plan:   Principal Problem:   Acute cerebrovascular accident (CVA) ([place]) Active Problems:   Diabetes mellitus without complication ([place])   Dyslipidemia   Seizures ([place])   CAD (coronary artery disease)   Essential hypertension   Carotid artery stenosis, asymptomatic   Tobacco abuse   Acute CVA (cerebrovascular accident) ([place])  1 acute ischemic CVA/3 cm acute infarct right superior cerebellum/right PICA territory infarct/small acute infarct left thalamus -Patient presented with unsteady gait and right arm and right lower extremity weakness. -CT head with acute to subacute right cerebellar infarct noted. -CT angio head and neck with no emergent large vessel occlusion.  Proximal occlusion of right superior cerebellar artery. -MRI head done with 3 cm acute infarct right superior cerebellum.  Small acute infarct right PICA territory.  Small acute infarct left thalamus. -MRA with acute to subacute right cerebellar infarct. -Repeat head CT done 01/23/2021 with stable infarct and no hydrocephalus. -2D echo done with EF 60 to 65%, mild LVH, grade 1 diastolic dysfunction, no source of emboli noted. -Fasting lipid panel with LDL of 57. -Patient noted to be on aspirin 81 mg and Plavix prior to admission. -Patient seen in consultation by neurology who are recommending aspirin 325 mg and Plavix 75 mg dual antiplatelet therapy x3 months, then back to home regimen of aspirin 81 mg and 75 mg daily. -Lipitor. -Patient seen by PT/OT who recommended CIR. -Per CIR insurance denied admission to CIR. -TOC consulted for SNF  placement.  2.  Seizure disorder -No seizures noted. -Continue Keppra.  3.  Hypertension -Systolic blood pressures in the 150s to 160s. -Continue Coreg, Imdur. -Resume home regimen lisinopril. -Follow.  4.  Type 2 diabetes mellitus -Hemoglobin A1c 7.1 (01/21/2021) -CBG 147 -SSI.  5.  Hyperlipidemia -Continue statin, Zetia.  6.  Coronary artery disease -Stable. -Continue Coreg, Imdur. -Resume home regimen lisinopril.  7.  Carotid artery stenosis -Noted on imaging for mild atherosclerotic disease in the carotid bulb bilaterally without significant stenosis. -Risk factor modification.  8.  Tobacco abuse -Tobacco cessation.     DVT prophylaxis: SCDs Code Status: Full Family Communication: Updated patient.  No family at bedside. Disposition:   Status is: Inpatient    Dispo: The patient is from: Home              Anticipated d/c is to: SNF              Patient currently medically stable awaiting placement.  Noted to have been denied by insurance company for [employer]   Difficult to place patient no       Consultants:   Neurology: Dr. Hortense Ramal 01/20/2021  Inpatient rehab: Dr. Dagoberto Ligas 01/22/2021  Procedures:   CT head 01/20/2021, 01/23/2021, 01/26/2021  MRI brain 01/20/2021  MRA head and neck 01/20/2021  2D echo 01/21/2021  CT angio head and neck 01/20/2021  Antimicrobials:  Anti-infectives (From admission, onward)   None        Subjective: Patient sleeping but arousable.  Denies any chest pain.  No shortness of breath.  Still with some right upper extremity and right lower extremity weakness slowly improving.  Patient also with some complaints of lightheadedness and dizziness when she states she sits up occasionally.  Tolerating current diet.  Objective: Vitals:   01/28/21 0021 01/28/21 0452 01/28/21 0815 01/28/21 1127  BP: (!) 157/68 (!) 163/73 (!) 155/77 (!) 157/78  Pulse: 72 76 66 66  Resp: 18 18 18 18   Temp: 99 F (37.2 C) 99.3 F (37.4 C) 98.4 F (36.9  C) 99.1 F (37.3 C)  TempSrc: Oral Oral Oral Oral  SpO2: 100% 99% 100% 98%  Weight:        Intake/Output Summary (Last 24 hours) at 01/28/2021 1219 Last data filed at 01/27/2021 1800 Gross per 24 hour  Intake 240 ml  Output --  Net 240 ml   Filed Weights   01/24/21 0355  Weight: 65.7 kg    Examination:  General exam: Appears calm and comfortable  Respiratory system: Clear to auscultation. Respiratory effort normal. Cardiovascular system: S1 & S2 heard, RRR. No JVD, murmurs, rubs, gallops or clicks. No pedal edema. Gastrointestinal system: Abdomen is nondistended, soft and nontender. No organomegaly or masses felt. Normal bowel sounds heard. Central nervous system: Alert and oriented.  Right upper and lower extremity weakness. Extremities: Symmetric 5 x 5 power. Skin: No rashes, lesions or ulcers Psychiatry: Judgement and insight appear normal. Mood & affect appropriate.     Data Reviewed: I have personally reviewed following labs and imaging studies  CBC: Recent Labs  Lab 01/22/21 0253 01/23/21 0341 01/24/21 0054 01/25/21 0304  WBC 8.4 8.2 7.6 7.8  NEUTROABS 4.3 4.3 3.9 3.6  HGB 14.8 13.8 13.5 13.0  HCT 43.8 41.5 39.8 39.0  MCV 94.8 95.6 95.4 96.3  PLT 183 188 157 159    Basic Metabolic Panel: Recent Labs  Lab 01/22/21 0253 01/23/21 0341 01/24/21 0054 01/25/21 0304  NA 138 137 138 142  K 4.1 3.9 3.9 4.1  CL 106 108 110 112*  CO2 23 21* 21* 23  GLUCOSE 139* 149* 127* 173*  BUN 20 20 23 18   CREATININE 1.13* 1.20* 1.09* 1.10*  CALCIUM 9.5 9.4 9.1 9.3  MG 2.0 1.9 1.9 1.8  PHOS 4.7* 4.4 4.5 4.7*    GFR: Estimated Creatinine Clearance: 44.9 mL/min (A) (by C-G formula based on SCr of 1.1 mg/dL (H)).  Liver Function Tests: Recent Labs  Lab 01/22/21 0253 01/23/21 0341 01/24/21 0054 01/25/21 0304  AST 20 22 20 21   ALT 18 18 18 20   ALKPHOS 106 118 101 102  BILITOT 0.6 0.5 0.3 0.5  PROT 7.0 6.5 6.1* 6.3*  ALBUMIN 3.9 3.8 3.4* 3.5    CBG: Recent  Labs  Lab 01/27/21 1124 01/27/21 1717 01/27/21 2110 01/28/21 0601 01/28/21 1101  GLUCAP 141* 103* 148* 147* 151*     Recent Results (from the past 240 hour(s))  Resp Panel by RT-PCR (Flu A&B, Covid) Nasopharyngeal Swab     Status: None   Collection Time: 01/20/21  3:52 PM   Specimen: Nasopharyngeal Swab; Nasopharyngeal(NP) swabs in vial transport medium  Result Value Ref Range Status   SARS Coronavirus 2 by RT PCR NEGATIVE NEGATIVE Final    Comment: (NOTE) SARS-CoV-2 target nucleic acids are NOT DETECTED.  The SARS-CoV-2 RNA is generally detectable in upper respiratory specimens during the acute phase of infection. The lowest concentration of SARS-CoV-2 viral copies this assay can detect is 138 copies/mL. A negative result does not preclude SARS-Cov-2 infection and should not be used as the sole basis for treatment or other patient management decisions. A negative result may  occur with  improper specimen collection/handling, submission of specimen other than nasopharyngeal swab, presence of viral mutation(s) within the areas targeted by this assay, and inadequate number of viral copies(<138 copies/mL). A negative result must be combined with clinical observations, patient history, and epidemiological information. The expected result is Negative.  Fact Sheet for Patients:  EntrepreneurPulse.com.au  Fact Sheet for Healthcare Providers:  IncredibleEmployment.be  This test is no t yet approved or cleared by the [place] FDA and  has been authorized for detection and/or diagnosis of SARS-CoV-2 by FDA under an Emergency Use Authorization (EUA). This EUA will remain  in effect (meaning this test can be used) for the duration of the COVID-19 declaration under Section 564(b)(1) of the Act, 21 U.S.C.section 360bbb-3(b)(1), unless the authorization is terminated  or revoked sooner.       Influenza A by PCR NEGATIVE NEGATIVE Final    Influenza B by PCR NEGATIVE NEGATIVE Final    Comment: (NOTE) The Xpert Xpress SARS-CoV-2/FLU/RSV plus assay is intended as an aid in the diagnosis of influenza from Nasopharyngeal swab specimens and should not be used as a sole basis for treatment. Nasal washings and aspirates are unacceptable for Xpert Xpress SARS-CoV-2/FLU/RSV testing.  Fact Sheet for Patients: EntrepreneurPulse.com.au  Fact Sheet for Healthcare Providers: IncredibleEmployment.be  This test is not yet approved or cleared by the [place] FDA and has been authorized for detection and/or diagnosis of SARS-CoV-2 by FDA under an Emergency Use Authorization (EUA). This EUA will remain in effect (meaning this test can be used) for the duration of the COVID-19 declaration under Section 564(b)(1) of the Act, 21 U.S.C. section 360bbb-3(b)(1), unless the authorization is terminated or revoked.  Performed at [facility], [place] [address], [place], [place] [address]          Radiology Studies: CT HEAD WO CONTRAST  Result Date: 01/26/2021 CLINICAL DATA:  Neurological deficit, like symptoms EXAM: CT HEAD WITHOUT CONTRAST TECHNIQUE: Contiguous axial images were obtained from the base of the skull through the vertex without intravenous contrast. Sagittal and coronal MPR images reconstructed from axial data set. COMPARISON:  01/23/2021 FINDINGS: Brain: Normal ventricular morphology. No midline shift or mass effect. Superior RIGHT cerebellar infarct again identified. LEFT thalamic lacunar infarct seen on prior exam is less well demonstrated on current study. Remaining brain parenchyma normal appearance. No intracranial hemorrhage, mass lesion or additional infarction. No extra-axial fluid collections. Vascular: Atherosclerotic calcifications of internal carotid arteries at skull base Skull: Intact Sinuses/Orbits: Clear Other: N/A IMPRESSION: Again identified RIGHT cerebellar and LEFT  thalamic infarcts. No new intracranial abnormalities. Electronically Signed   By: Lavonia Dana M.D.   On: 01/26/2021 15:13        Scheduled Meds: .  stroke: mapping our early stages of recovery book   Does not apply Once  .  stroke: mapping our early stages of recovery book   Does not apply Once  . aspirin EC  325 mg Oral Daily  . atorvastatin  80 mg Oral Daily  . carvedilol  3.125 mg Oral BID  . clopidogrel  75 mg Oral Daily  . escitalopram  10 mg Oral QHS  . ezetimibe  10 mg Oral QHS  . insulin aspart  0-15 Units Subcutaneous TID WC  . isosorbide mononitrate  15 mg Oral Daily  . levETIRAcetam  500 mg Oral BID  . lisinopril  20 mg Oral Daily  . pantoprazole  40 mg Oral Daily  . polyethylene glycol  17 g Oral BID  .  topiramate  75 mg Oral BID   Continuous Infusions:   LOS: 8 days    Time spent: 35 minutes    Irine Seal, MD [facility]   To contact the attending provider between 7A-7P or the covering provider during after hours 7P-7A, please log into the web site [email or web address] and access using universal [place] password for that web site. If you do not have the password, please call the hospital operator.  01/28/2021, 12:19 PM

## 2021-01-29 LAB — GLUCOSE, CAPILLARY
Glucose-Capillary: 141 mg/dL — ABNORMAL HIGH (ref 70–99)
Glucose-Capillary: 151 mg/dL — ABNORMAL HIGH (ref 70–99)
Glucose-Capillary: 266 mg/dL — ABNORMAL HIGH (ref 70–99)
Glucose-Capillary: 150 mg/dL — ABNORMAL HIGH (ref 70–99)
Glucose-Capillary: 141 mg/dL — ABNORMAL HIGH (ref 70–99)

## 2021-01-29 LAB — BASIC METABOLIC PANEL
Anion gap: 9 (ref 5–15)
BUN: 21 mg/dL (ref 8–23)
CO2: 19 mmol/L — ABNORMAL LOW (ref 22–32)
Calcium: 9.2 mg/dL (ref 8.9–10.3)
Chloride: 109 mmol/L (ref 98–111)
Creatinine, Ser: 1.08 mg/dL — ABNORMAL HIGH (ref 0.44–1.00)
GFR, Estimated: 57 mL/min — ABNORMAL LOW (ref >60)
Glucose, Bld: 125 mg/dL — ABNORMAL HIGH (ref 70–99)
Potassium: 4.4 mmol/L (ref 3.5–5.1)
Sodium: 137 mmol/L (ref 135–145)

## 2021-01-29 MED ORDER — SODIUM BICARBONATE 650 MG PO TABS
650.0000 mg | ORAL_TABLET | Freq: Two times a day (BID) | ORAL | Status: DC
  Administered 2021-01-29 – 2021-01-30 (×3): 650 mg via ORAL
  Filled 2021-01-29 (×3): qty 1

## 2021-01-29 NOTE — Progress Notes (Signed)
PROGRESS NOTE    Samantha Huffman  SNK:[id number] DOB: October 17, 1955 DOA: 01/20/2021 PCP: Alroy Dust, L.Marlou Sa, MD   No chief complaint on file.   Brief Narrative:  Samantha Huffman is a 65 y.o. female seen in ed with complaints of  Unsteady gait and right arm and right leg weakness since Sunday. Found to have a CVA.  Awaiting CIR placement vs SNF pending insurance approval.    Assessment & Plan:   Principal Problem:   Acute cerebrovascular accident (CVA) ([place]) Active Problems:   Diabetes mellitus without complication ([place])   Dyslipidemia   Seizures ([place])   CAD (coronary artery disease)   Essential hypertension   Carotid artery stenosis, asymptomatic   Tobacco abuse   Acute CVA (cerebrovascular accident) ([place])  1 acute ischemic CVA/3 cm acute infarct right superior cerebellum/right PICA territory infarct/small acute infarct left thalamus -Patient presented with unsteady gait and right arm and right lower extremity weakness. -CT head with acute to subacute right cerebellar infarct noted. -CT angio head and neck with no emergent large vessel occlusion.  Proximal occlusion of right superior cerebellar artery. -MRI head done with 3 cm acute infarct right superior cerebellum.  Small acute infarct right PICA territory.  Small acute infarct left thalamus. -MRA with acute to subacute right cerebellar infarct. -Repeat head CT done 01/23/2021 with stable infarct and no hydrocephalus. -2D echo done with EF 60 to 65%, mild LVH, grade 1 diastolic dysfunction, no source of emboli noted. -Fasting lipid panel with LDL of 57. -Patient noted to be on aspirin 81 mg and Plavix prior to admission. -Patient seen in consultation by neurology who are recommending aspirin 325 mg and Plavix 75 mg dual antiplatelet therapy x3 months, then back to home regimen of aspirin 81 mg and 75 mg daily. -Lipitor. -Patient seen by PT/OT who recommended CIR. -Per CIR insurance denied admission to CIR. -TOC consulted for SNF  placement.  2.  Seizure disorder -No seizures noted. -Continue Keppra.  3.  Hypertension -Systolic blood pressure improved currently in the 130s. -Continue Imdur, Coreg, lisinopril.   -Follow.   4.  Type 2 diabetes mellitus -Hemoglobin A1c 7.1 (01/21/2021) -CBG 151 this morning.   -SSI.    5.  Hyperlipidemia -Zetia, statin.   6.  Coronary artery disease -Stable. -Continue Imdur, Coreg, lisinopril.  7.  Carotid artery stenosis -Noted on imaging for mild atherosclerotic disease in the carotid bulb bilaterally without significant stenosis. -Risk factor modification.  8.  Tobacco abuse -Tobacco cessation.     DVT prophylaxis: SCDs Code Status: Full Family Communication: Updated patient.  No family at bedside. Disposition:   Status is: Inpatient    Dispo: The patient is from: Home              Anticipated d/c is to: SNF              Patient currently medically stable awaiting placement.  Noted to have been denied by insurance company for [employer] with appeal pending..   Difficult to place patient no       Consultants:  Neurology: Dr. Hortense Ramal 01/20/2021 Inpatient rehab: Dr. Dagoberto Ligas 01/22/2021  Procedures:  CT head 01/20/2021, 01/23/2021, 01/26/2021 MRI brain 01/20/2021 MRA head and neck 01/20/2021 2D echo 01/21/2021 CT angio head and neck 01/20/2021  Antimicrobials:  Anti-infectives (From admission, onward)    None        Subjective: Sitting up in chair eating her lunch.  No chest pain.  No shortness of breath.  States she is trying her best  to get better.  Still with significant weakness.  No lightheadedness or dizziness.    Objective: Vitals:   01/28/21 2050 01/29/21 0031 01/29/21 0421 01/29/21 0731  BP: 133/70 139/78 (!) 146/79 129/78  Pulse: 67 64 61 64  Resp: 18 18 18 18   Temp: 98.8 F (37.1 C) 98.3 F (36.8 C) 98.2 F (36.8 C) 97.8 F (36.6 C)  TempSrc: Oral Oral Oral Oral  SpO2: 97% 98% 99% 99%  Weight:       No intake or output data in the 24 hours  ending 01/29/21 1145  Filed Weights   01/24/21 0355  Weight: 65.7 kg    Examination:  General exam: : NAD Respiratory system: CTA B.  No wheezes, no crackles, no rhonchi.  Normal respiratory effort.  Speaking in full sentences.  Cardiovascular system: Regular rate and rhythm no murmurs rubs or gallops.  No JVD.  No lower extremity edema.  Gastrointestinal system: Abdomen soft, nontender, nondistended, positive bowel sounds.  No rebound.  No guarding. Central nervous system: Alert and oriented. No focal neurological deficits. Extremities: Symmetric 5 x 5 power. Skin: No rashes, lesions or ulcers Psychiatry: Judgement and insight appear normal. Mood & affect appropriate.  Data Reviewed: I have personally reviewed following labs and imaging studies  CBC: Recent Labs  Lab 01/23/21 0341 01/24/21 0054 01/25/21 0304  WBC 8.2 7.6 7.8  NEUTROABS 4.3 3.9 3.6  HGB 13.8 13.5 13.0  HCT 41.5 39.8 39.0  MCV 95.6 95.4 96.3  PLT 188 157 163     Basic Metabolic Panel: Recent Labs  Lab 01/23/21 0341 01/24/21 0054 01/25/21 0304 01/29/21 0359  NA 137 138 142 137  K 3.9 3.9 4.1 4.4  CL 108 110 112* 109  CO2 21* 21* 23 19*  GLUCOSE 149* 127* 173* 125*  BUN 20 23 18 21   CREATININE 1.20* 1.09* 1.10* 1.08*  CALCIUM 9.4 9.1 9.3 9.2  MG 1.9 1.9 1.8  --   PHOS 4.4 4.5 4.7*  --      GFR: Estimated Creatinine Clearance: 45.7 mL/min (A) (by C-G formula based on SCr of 1.08 mg/dL (H)).  Liver Function Tests: Recent Labs  Lab 01/23/21 0341 01/24/21 0054 01/25/21 0304  AST 22 20 21   ALT 18 18 20   ALKPHOS 118 101 102  BILITOT 0.5 0.3 0.5  PROT 6.5 6.1* 6.3*  ALBUMIN 3.8 3.4* 3.5     CBG: Recent Labs  Lab 01/28/21 1700 01/28/21 2148 01/29/21 0614 01/29/21 0729 01/29/21 1059  GLUCAP 78 115* 141* 151* 266*      Recent Results (from the past 240 hour(s))  Resp Panel by RT-PCR (Flu A&B, Covid) Nasopharyngeal Swab     Status: None   Collection Time: 01/20/21  3:52 PM    Specimen: Nasopharyngeal Swab; Nasopharyngeal(NP) swabs in vial transport medium  Result Value Ref Range Status   SARS Coronavirus 2 by RT PCR NEGATIVE NEGATIVE Final    Comment: (NOTE) SARS-CoV-2 target nucleic acids are NOT DETECTED.  The SARS-CoV-2 RNA is generally detectable in upper respiratory specimens during the acute phase of infection. The lowest concentration of SARS-CoV-2 viral copies this assay can detect is 138 copies/mL. A negative result does not preclude SARS-Cov-2 infection and should not be used as the sole basis for treatment or other patient management decisions. A negative result may occur with  improper specimen collection/handling, submission of specimen other than nasopharyngeal swab, presence of viral mutation(s) within the areas targeted by this assay, and inadequate number of viral copies(<138  copies/mL). A negative result must be combined with clinical observations, patient history, and epidemiological information. The expected result is Negative.  Fact Sheet for Patients:  EntrepreneurPulse.com.au  Fact Sheet for Healthcare Providers:  IncredibleEmployment.be  This test is no t yet approved or cleared by the [place] FDA and  has been authorized for detection and/or diagnosis of SARS-CoV-2 by FDA under an Emergency Use Authorization (EUA). This EUA will remain  in effect (meaning this test can be used) for the duration of the COVID-19 declaration under Section 564(b)(1) of the Act, 21 U.S.C.section 360bbb-3(b)(1), unless the authorization is terminated  or revoked sooner.       Influenza A by PCR NEGATIVE NEGATIVE Final   Influenza B by PCR NEGATIVE NEGATIVE Final    Comment: (NOTE) The Xpert Xpress SARS-CoV-2/FLU/RSV plus assay is intended as an aid in the diagnosis of influenza from Nasopharyngeal swab specimens and should not be used as a sole basis for treatment. Nasal washings and aspirates are  unacceptable for Xpert Xpress SARS-CoV-2/FLU/RSV testing.  Fact Sheet for Patients: EntrepreneurPulse.com.au  Fact Sheet for Healthcare Providers: IncredibleEmployment.be  This test is not yet approved or cleared by the [place] FDA and has been authorized for detection and/or diagnosis of SARS-CoV-2 by FDA under an Emergency Use Authorization (EUA). This EUA will remain in effect (meaning this test can be used) for the duration of the COVID-19 declaration under Section 564(b)(1) of the Act, 21 U.S.C. section 360bbb-3(b)(1), unless the authorization is terminated or revoked.  Performed at [facility], [place] [address] [place], [place] [address]          Radiology Studies: No results found.       Scheduled Meds:   stroke: mapping our early stages of recovery book   Does not apply Once    stroke: mapping our early stages of recovery book   Does not apply Once   aspirin EC  325 mg Oral Daily   atorvastatin  80 mg Oral Daily   carvedilol  3.125 mg Oral BID   clopidogrel  75 mg Oral Daily   escitalopram  10 mg Oral QHS   ezetimibe  10 mg Oral QHS   insulin aspart  0-15 Units Subcutaneous TID WC   isosorbide mononitrate  15 mg Oral Daily   levETIRAcetam  500 mg Oral BID   lisinopril  20 mg Oral Daily   pantoprazole  40 mg Oral Daily   polyethylene glycol  17 g Oral BID   sodium bicarbonate  650 mg Oral BID   topiramate  75 mg Oral BID   Continuous Infusions:   LOS: 9 days    Time spent: 35 minutes    Irine Seal, MD [facility]   To contact the attending provider between 7A-7P or the covering provider during after hours 7P-7A, please log into the web site [email or web address] and access using universal [place] password for that web site. If you do not have the password, please call the hospital operator.  01/29/2021, 11:45 AM

## 2021-01-29 NOTE — Progress Notes (Signed)
Patient has home CPAP on bedside table. RT filled CPAP with sterile water and mask is within reach of patient. Patient stated she would place herself on without assistance. RT instructed patient to have RT called if needed. RT will continue to monitor patient as needed.

## 2021-01-29 NOTE — Progress Notes (Signed)
Occupational Therapy Treatment Patient Details Name: Samantha Huffman MRN: [id number] DOB: November 16, 1955 Today's Date: 01/29/2021    History of present illness Pt is a 65 y.o. F who presents 5/31 with dizziness and weakness. 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 On 6/6, pt with increase in symptoms during PT session and was sent for CT that showed no acute abnormality and unchanged previous infarcts. Significant PMH: HTN, CABG, epilepsy, nicotine use.   OT comments  Pt making steady progress towards OT goals this session. Session focus on RUE [facility] and improving motor planning/ proprioception with RUE during gross motor movements. Pt received in bed finishing breakfast with pt able to self feed with RUE with increased time and effort. Pt able to retreive small items with RUE to open/ close each item and place items on target to facilitate improved motor planning. Attempted task with R eye occluded with no improvement noted. Pt very motivated to improve immediately working on HEP as OTA left room. Pt would continue to benefit from skilled occupational therapy while admitted and after d/c to address the below listed limitations in order to improve overall functional mobility and facilitate independence with BADL participation. DC plan remains appropriate, will follow acutely per POC.     Follow Up Recommendations  CIR    Equipment Recommendations  3 in 1 bedside commode    Recommendations for Other Services      Precautions / Restrictions Precautions Precautions: Fall Precaution Comments: ataxia, RLE/RUE Restrictions Weight Bearing Restrictions: No       Mobility Bed Mobility                    Transfers                      Balance                                           ADL either performed or assessed with clinical judgement   ADL Overall ADL's :  Needs assistance/impaired Eating/Feeding: Supervision/ safety;Set up;Sitting Eating/Feeding Details (indicate cue type and reason): sitting upright in bed, pt eating cereal with bananas, pt able to cut up each banana slice with increased time, poor depth perception noted with ataxic movements in RUE                                         Vision Baseline Vision/History: Wears glasses;Cataracts (cataract surgey in both eyes) Wears Glasses: Reading only Patient Visual Report: Blurring of vision;Other (comment) (pt reports blurry vision in R eye when occluding L eye)     Perception     Praxis Praxis Praxis-Other Comments: ataxic movements noted during self feeding tasks    Cognition Arousal/Alertness: Awake/alert Behavior During Therapy: WFL for tasks assessed/performed Overall Cognitive Status: Impaired/Different from baseline Area of Impairment: Awareness;Problem solving                           Awareness: Emergent Problem Solving: Slow processing;Difficulty sequencing;Requires verbal cues General Comments: tactile and verbal cues at times to follow HEP        Exercises Hand Exercises Digit Composite Abduction: AROM;Right;5 reps;Seated Digit Composite Adduction:  AROM;Right;5 reps;Seated Digit Lifts: Right;5 reps;Seated;AROM Opposition: AROM;Right;5 reps;Seated Hand Activities Stack Objects: Right (placing objects from container on line on tray table; opening and closing items) Other Exercises Other Exercises: worked on motor planning of placing objects on line on tray table, noted to have RUE ataxia with gross motor movements   Shoulder Instructions       General Comments      Pertinent Vitals/ Pain       Pain Assessment: No/denies pain  Home Living                                          Prior Functioning/Environment              Frequency  Min 2X/week        Progress Toward Goals  OT Goals(current  goals can now be found in the care plan section)  Progress towards OT goals: Progressing toward goals  Acute Rehab OT Goals Patient Stated Goal: to go to rehab OT Goal Formulation: With patient Time For Goal Achievement: 02/04/21 Potential to Achieve Goals: Good  Plan Discharge plan remains appropriate;Frequency remains appropriate    Co-evaluation                 AM-PAC OT "6 Clicks" Daily Activity     Outcome Measure   Help from another person eating meals?: A Little Help from another person taking care of personal grooming?: A Little Help from another person toileting, which includes using toliet, bedpan, or urinal?: A Little Help from another person bathing (including washing, rinsing, drying)?: A Little Help from another person to put on and taking off regular upper body clothing?: A Little Help from another person to put on and taking off regular lower body clothing?: A Lot 6 Click Score: 17    End of Session    OT Visit Diagnosis: Unsteadiness on feet (R26.81);Muscle weakness (generalized) (M62.81);Other symptoms and signs involving cognitive function   Activity Tolerance Patient tolerated treatment well   Patient Left in bed;with call bell/phone within reach;with bed alarm set   Nurse Communication Mobility status        Time: 5277-8242 OT Time Calculation (min): 29 min  Charges: OT General Charges $OT Visit: 1 Visit OT Treatments $Therapeutic Activity: 23-37 mins  Harley Alto., COTA/L Acute Rehabilitation Services [phone]    Precious Haws 01/29/2021, 8:40 AM

## 2021-01-29 NOTE — NC FL2 (Signed)
[place] LEVEL OF CARE SCREENING TOOL     IDENTIFICATION  Patient Name: Samantha Huffman Birthdate: 06/21/1956 Sex: female Admission Date (Current Location): 01/20/2021  [facility] and [place] Number:  Herbalist and Address:  [facility], [place] [address] [place], [place] [address]      Provider Number: [id number]  Attending Physician Name and Address:  Eugenie Filler, MD  Relative Name and Phone Number:       Current Level of Care: Hospital Recommended Level of Care: [place] Prior Approval Number:    Date Approved/Denied:   PASRR Number: [id number]  Discharge Plan: SNF    Current Diagnoses: Patient Active Problem List   Diagnosis Date Noted   Acute cerebrovascular accident (CVA) ([place]) 01/20/2021   Acute CVA (cerebrovascular accident) ([place]) 01/20/2021   GERD (gastroesophageal reflux disease)    Coronary artery disease    Depression 01/06/2020   Pneumonia 01/05/2020   History of chest pain 09/26/2018   Dyspnea on exertion 09/08/2018   Chest pain, rule out acute myocardial infarction 10/30/2017   Edema 01/25/2017   Tobacco abuse 06/10/2016   Headache 09/15/2015   OSA (obstructive sleep apnea) 07/30/2015   Nephrolithiasis 07/30/2015   Vasovagal syncope 07/30/2015   Carotid artery stenosis, asymptomatic 07/30/2015   CAD (coronary artery disease) 08/22/2013   Essential hypertension 08/22/2013   Diabetes mellitus without complication ([place])    Dyslipidemia    Seizures ([place])    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   Memory deficit 03/22/2013    Orientation RESPIRATION BLADDER Height & Weight     Time, Self, Situation, Place  Normal Continent Weight: 144 lb 13.5 oz (65.7 kg) Height:     BEHAVIORAL SYMPTOMS/MOOD NEUROLOGICAL BOWEL NUTRITION STATUS      Continent Diet  AMBULATORY STATUS COMMUNICATION OF NEEDS Skin   Limited Assist Verbally Normal                       Personal  Care Assistance Level of Assistance  Feeding, Bathing, Dressing Bathing Assistance: Limited assistance Feeding assistance: Independent Dressing Assistance: Limited assistance     Functional Limitations Info  Hearing, Sight, Speech Sight Info: Adequate Hearing Info: Adequate Speech Info: Adequate    SPECIAL CARE FACTORS FREQUENCY  PT (By licensed PT), OT (By licensed OT)     PT Frequency: 5x a week OT Frequency: 5x a week            Contractures      Additional Factors Info  Code Status, Allergies Code Status Info: full Allergies Info: Depakote (Divalproex Sodium)   Other   Penicillins           Current Medications (01/29/2021):  This is the current hospital active medication list Current Facility-Administered Medications  Medication Dose Route Frequency Provider Last Rate Last Admin    stroke: mapping our early stages of recovery book   Does not apply Once Para Skeans, MD        stroke: mapping our early stages of recovery book   Does not apply Once Lora Havens, MD       acetaminophen (TYLENOL) tablet 650 mg  650 mg Oral Q4H PRN Para Skeans, MD       aspirin EC tablet 325 mg  325 mg Oral Daily Rosalin Hawking, MD   325 mg at 01/29/21 0939   atorvastatin (LIPITOR) tablet 80 mg  80 mg Oral Daily Para Skeans, MD  80 mg at 01/29/21 0939   carvedilol (COREG) tablet 3.125 mg  3.125 mg Oral BID Elodia Florence., MD   3.125 mg at 01/29/21 0940   clopidogrel (PLAVIX) tablet 75 mg  75 mg Oral Daily Lora Havens, MD   75 mg at 01/29/21 0939   escitalopram (LEXAPRO) tablet 10 mg  10 mg Oral QHS Florina Ou V, MD   10 mg at 01/28/21 2334   ezetimibe (ZETIA) tablet 10 mg  10 mg Oral QHS Florina Ou V, MD   10 mg at 01/28/21 2334   insulin aspart (novoLOG) injection 0-15 Units  0-15 Units Subcutaneous TID WC Para Skeans, MD   2 Units at 01/29/21 0641   isosorbide mononitrate (IMDUR) 24 hr tablet 15 mg  15 mg Oral Daily Vann, Jessica U, DO   15 mg at 01/29/21 0940    labetalol (NORMODYNE) injection 5 mg  5 mg Intravenous Q10 min PRN Para Skeans, MD   5 mg at 01/20/21 2023   levETIRAcetam (KEPPRA) tablet 500 mg  500 mg Oral BID Para Skeans, MD   500 mg at 01/28/21 2334   lisinopril (ZESTRIL) tablet 20 mg  20 mg Oral Daily Eugenie Filler, MD   20 mg at 01/29/21 0940   pantoprazole (PROTONIX) EC tablet 40 mg  40 mg Oral Daily Para Skeans, MD   40 mg at 01/29/21 0939   polyethylene glycol (MIRALAX / GLYCOLAX) packet 17 g  17 g Oral BID Elodia Florence., MD   17 g at 01/29/21 0941   sodium bicarbonate tablet 650 mg  650 mg Oral BID Eugenie Filler, MD   650 mg at 01/29/21 0939   topiramate (TOPAMAX) tablet 75 mg  75 mg Oral BID Para Skeans, MD   75 mg at 01/29/21 8592     Discharge Medications: Please see discharge summary for a list of discharge medications.  Relevant Imaging Results:  Relevant Lab Results:   Additional Information SSN [id number]  Emeterio Reeve, [place]

## 2021-01-29 NOTE — TOC Initial Note (Signed)
Transition of Care [facility]) - Initial/Assessment Note    Patient Details  Name: Samantha Huffman MRN: [id number] Date of Birth: 09-16-1955  Transition of Care [facility]) CM/SW Contact:    Emeterio Reeve, [place] Phone Number: 01/29/2021, 11:27 AM  Clinical Narrative:                  CSW met with pt at bedside. CSW introduced self and explained her role at the hospital.  Pt reports PTA she was living at home alone. Pt reports she was independent with mobility and ADLs.   CSW reviewed SNF back up option. Pt is appealing CIR decision. Pt gave CSW permission to fax out to SNF as a back up option. Pt has no SNF preferences at this time.   TOC will follow.  Expected Discharge Plan: Skilled Nursing Facility Barriers to Discharge: Continued Medical Work up, SNF Pending bed offer, Insurance Authorization   Patient Goals and CMS Choice Patient states their goals for this hospitalization and ongoing recovery are:: to get better CMS Medicare.gov Compare Post Acute Care list provided to:: Patient Choice offered to / list presented to : Patient  Expected Discharge Plan and Services Expected Discharge Plan: [place]       Living arrangements for the past 2 months: Single Family Home                                      Prior Living Arrangements/Services Living arrangements for the past 2 months: Single Family Home Lives with:: Self Patient language and need for interpreter reviewed:: Yes Do you feel safe going back to the place where you live?: Yes      Need for Family Participation in Patient Care: Yes (Comment) Care giver support system in place?: No (comment)   Criminal Activity/Legal Involvement Pertinent to Current Situation/Hospitalization: No - Comment as needed  Activities of Daily Living Home Assistive Devices/Equipment: None ADL Screening (condition at time of admission) Patient's cognitive ability adequate to safely complete daily activities?: Yes Is the  patient deaf or have difficulty hearing?: No Does the patient have difficulty seeing, even when wearing glasses/contacts?: No Does the patient have difficulty concentrating, remembering, or making decisions?: No Patient able to express need for assistance with ADLs?: Yes Does the patient have difficulty dressing or bathing?: No Independently performs ADLs?: Yes (appropriate for developmental age) Does the patient have difficulty walking or climbing stairs?: Yes Weakness of Legs: Both Weakness of Arms/Hands: None  Permission Sought/Granted   Permission granted to share information with : Yes, Verbal Permission Granted     Permission granted to share info w AGENCY: SNF        Emotional Assessment Appearance:: Appears stated age Attitude/Demeanor/Rapport: Engaged Affect (typically observed): Appropriate Orientation: : Oriented to Self, Oriented to Place, Oriented to  Time, Oriented to Situation Alcohol / Substance Use: Not Applicable Psych Involvement: No (comment)  Admission diagnosis:  Noncompliance with medications [Z91.14] Acute CVA (cerebrovascular accident) ([place]) [I63.9] Cerebrovascular accident (CVA), unspecified mechanism ([place]) [I63.9] Patient Active Problem List   Diagnosis Date Noted   Acute cerebrovascular accident (CVA) ([place]) 01/20/2021   Acute CVA (cerebrovascular accident) ([place]) 01/20/2021   GERD (gastroesophageal reflux disease)    Coronary artery disease    Depression 01/06/2020   Pneumonia 01/05/2020   History of chest pain 09/26/2018   Dyspnea on exertion 09/08/2018   Chest pain, rule out acute myocardial infarction 10/30/2017  Edema 01/25/2017   Tobacco abuse 06/10/2016   Headache 09/15/2015   OSA (obstructive sleep apnea) 07/30/2015   Nephrolithiasis 07/30/2015   Vasovagal syncope 07/30/2015   Carotid artery stenosis, asymptomatic 07/30/2015   CAD (coronary artery disease) 08/22/2013   Essential hypertension 08/22/2013   Diabetes mellitus without  complication ([place])    Dyslipidemia    Seizures ([place])    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  MiEmeterio ReeveLCLatanya PresserLCSycamoreocial Worker [phone]

## 2021-01-29 NOTE — Progress Notes (Signed)
01/29/21 8299  PT Visit Information  Last PT Received On 01/29/21  Assistance Needed +1  History of Present Illness Pt is a 65 y.o. F who presents 5/31 with dizziness and weakness. 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 On 6/6, pt with increase in symptoms during PT session and was sent for CT that showed no acute abnormality and unchanged previous infarcts. Significant PMH: HTN, CABG, epilepsy, nicotine use.  Subjective Data  Patient Stated Goal to go to rehab  Precautions  Precautions Fall  Restrictions  Weight Bearing Restrictions No  Pain Assessment  Pain Assessment No/denies pain  Cognition  Arousal/Alertness Awake/alert  Behavior During Therapy [facility] for tasks assessed/performed  Overall Cognitive Status Impaired/Different from baseline  Area of Impairment Awareness;Problem solving  Awareness Emergent  Problem Solving Slow processing;Difficulty sequencing;Requires verbal cues  General Comments Increased time to answer questions and follow commands  Bed Mobility  Overal bed mobility Needs Assistance  Bed Mobility Supine to Sit  Supine to sit Supervision  General bed mobility comments Supervision for safety with use of bed rails.  Transfers  Overall transfer level Needs assistance  Equipment used Rolling walker (2 wheeled)  Transfers Sit to/from Stand  Sit to Stand Min guard  General transfer comment Min guard for safety. Required cues for safe hand placement.  Ambulation/Gait  Ambulation/Gait assistance Min assist;Mod assist;Min guard  Gait Distance (Feet) 100 Feet  Assistive device Rolling walker (2 wheeled)  Gait Pattern/deviations Ataxic;Decreased stride length;Narrow base of support;Step-to pattern;Step-through pattern;Decreased dorsiflexion - right  General Gait Details Slow, cautious gait. Initially requiring min A for steadying. As pt fatigued, pt with more notable  ataxia in RLE and required up to mod A for steadying.  Gait velocity Decreased  Modified Rankin (Stroke Patients Only)  Pre-Morbid Rankin Score 0  Modified Rankin 4  Balance  Overall balance assessment Needs assistance  Sitting-balance support Feet supported  Sitting balance-Leahy Scale Good  Standing balance support Bilateral upper extremity supported  Standing balance-Leahy Scale Poor  Standing balance comment Reliant on UE and external support  Exercises  Exercises Other exercises  Other Exercises  Other Exercises Worked on slow controlled RLE stepping activity towards target on the L, middle, and R. Required min A for steadying without use of AD. Performed X15.  PT - End of Session  Equipment Utilized During Treatment Gait belt  Activity Tolerance Patient tolerated treatment well;Treatment limited secondary to medical complications (Comment)  Patient left in chair;with call bell/phone within reach;with chair alarm set  Nurse Communication Mobility status   PT - Assessment/Plan  PT Plan Current plan remains appropriate  PT Visit Diagnosis Unsteadiness on feet (R26.81);Ataxic gait (R26.0);Difficulty in walking, not elsewhere classified (R26.2)  PT Frequency (ACUTE ONLY) Min 4X/week  Follow Up Recommendations CIR  PT equipment Rolling walker with 5" wheels;3in1 (PT)  AM-PAC PT "6 Clicks" Mobility Outcome Measure (Version 2)  Help needed turning from your back to your side while in a flat bed without using bedrails? 3  Help needed moving from lying on your back to sitting on the side of a flat bed without using bedrails? 3  Help needed moving to and from a bed to a chair (including a wheelchair)? 2  Help needed standing up from a chair using your arms (e.g., wheelchair or bedside chair)? 3  Help needed to walk in hospital room? 2  Help needed climbing 3-5 steps with a railing?  1  6 Click Score 14  Consider Recommendation of Discharge To: CIR/SNF/LTACH  PT Goal Progression   Progress towards PT goals Progressing toward goals  Acute Rehab PT Goals  PT Goal Formulation With patient  Time For Goal Achievement 02/04/21  Potential to Achieve Goals Good  PT Time Calculation  PT Start Time (ACUTE ONLY) 0836  PT Stop Time (ACUTE ONLY) 0853  PT Time Calculation (min) (ACUTE ONLY) 17 min  PT General Charges  $$ ACUTE PT VISIT 1 Visit  PT Treatments  $Gait Training 8-22 mins   Pt progressing towards goals. Continues to present with ataxic gait and requiring min up to mod A as pt fatigued. Worked on controlled stepping of RLE towards targets in standing this session. Current recommendations for CIR remain appropriate to increase independence and safety with mobility. Will continue to follow acutely.   Reuel Derby, PT, DPT  Acute Rehabilitation Services  Pager: [phone] Office: [phone]

## 2021-01-30 ENCOUNTER — Encounter (HOSPITAL_COMMUNITY): Payer: Self-pay | Admitting: Physical Medicine & Rehabilitation

## 2021-01-30 ENCOUNTER — Inpatient Hospital Stay (HOSPITAL_COMMUNITY)
Admission: RE | Admit: 2021-01-30 | Discharge: 2021-02-11 | DRG: 057 | Disposition: A | Discharge status: Home / Self Care | Payer: Medicare HMO | Source: Intra-hospital | Attending: Physical Medicine & Rehabilitation | Admitting: Physical Medicine & Rehabilitation

## 2021-01-30 ENCOUNTER — Other Ambulatory Visit: Payer: Self-pay

## 2021-01-30 DIAGNOSIS — E119 Type 2 diabetes mellitus without complications: Secondary | ICD-10-CM | POA: Diagnosis present

## 2021-01-30 DIAGNOSIS — Z87442 Personal history of urinary calculi: Secondary | ICD-10-CM

## 2021-01-30 DIAGNOSIS — I69311 Memory deficit following cerebral infarction: Secondary | ICD-10-CM

## 2021-01-30 DIAGNOSIS — F17210 Nicotine dependence, cigarettes, uncomplicated: Secondary | ICD-10-CM | POA: Diagnosis present

## 2021-01-30 DIAGNOSIS — I69392 Facial weakness following cerebral infarction: Secondary | ICD-10-CM

## 2021-01-30 DIAGNOSIS — Z823 Family history of stroke: Secondary | ICD-10-CM

## 2021-01-30 DIAGNOSIS — E785 Hyperlipidemia, unspecified: Secondary | ICD-10-CM | POA: Diagnosis present

## 2021-01-30 DIAGNOSIS — Z8249 Family history of ischemic heart disease and other diseases of the circulatory system: Secondary | ICD-10-CM

## 2021-01-30 DIAGNOSIS — Z833 Family history of diabetes mellitus: Secondary | ICD-10-CM | POA: Diagnosis not present

## 2021-01-30 DIAGNOSIS — Z951 Presence of aortocoronary bypass graft: Secondary | ICD-10-CM | POA: Diagnosis not present

## 2021-01-30 DIAGNOSIS — Z79899 Other long term (current) drug therapy: Secondary | ICD-10-CM

## 2021-01-30 DIAGNOSIS — F411 Generalized anxiety disorder: Secondary | ICD-10-CM

## 2021-01-30 DIAGNOSIS — Z9842 Cataract extraction status, left eye: Secondary | ICD-10-CM

## 2021-01-30 DIAGNOSIS — E1169 Type 2 diabetes mellitus with other specified complication: Secondary | ICD-10-CM

## 2021-01-30 DIAGNOSIS — Z880 Allergy status to penicillin: Secondary | ICD-10-CM | POA: Diagnosis not present

## 2021-01-30 DIAGNOSIS — I2510 Atherosclerotic heart disease of native coronary artery without angina pectoris: Secondary | ICD-10-CM | POA: Diagnosis present

## 2021-01-30 DIAGNOSIS — I6931 Attention and concentration deficit following cerebral infarction: Secondary | ICD-10-CM | POA: Diagnosis not present

## 2021-01-30 DIAGNOSIS — F419 Anxiety disorder, unspecified: Secondary | ICD-10-CM | POA: Diagnosis present

## 2021-01-30 DIAGNOSIS — Z888 Allergy status to other drugs, medicaments and biological substances status: Secondary | ICD-10-CM

## 2021-01-30 DIAGNOSIS — R569 Unspecified convulsions: Secondary | ICD-10-CM

## 2021-01-30 DIAGNOSIS — I69393 Ataxia following cerebral infarction: Secondary | ICD-10-CM | POA: Diagnosis not present

## 2021-01-30 DIAGNOSIS — Z7982 Long term (current) use of aspirin: Secondary | ICD-10-CM

## 2021-01-30 DIAGNOSIS — E669 Obesity, unspecified: Secondary | ICD-10-CM

## 2021-01-30 DIAGNOSIS — R209 Unspecified disturbances of skin sensation: Secondary | ICD-10-CM | POA: Diagnosis present

## 2021-01-30 DIAGNOSIS — I639 Cerebral infarction, unspecified: Secondary | ICD-10-CM | POA: Diagnosis not present

## 2021-01-30 DIAGNOSIS — G40909 Epilepsy, unspecified, not intractable, without status epilepticus: Secondary | ICD-10-CM

## 2021-01-30 DIAGNOSIS — Z6827 Body mass index (BMI) 27.0-27.9, adult: Secondary | ICD-10-CM

## 2021-01-30 DIAGNOSIS — G40409 Other generalized epilepsy and epileptic syndromes, not intractable, without status epilepticus: Secondary | ICD-10-CM | POA: Diagnosis present

## 2021-01-30 DIAGNOSIS — I69351 Hemiplegia and hemiparesis following cerebral infarction affecting right dominant side: Principal | ICD-10-CM

## 2021-01-30 DIAGNOSIS — R001 Bradycardia, unspecified: Secondary | ICD-10-CM | POA: Diagnosis not present

## 2021-01-30 DIAGNOSIS — Z7984 Long term (current) use of oral hypoglycemic drugs: Secondary | ICD-10-CM

## 2021-01-30 DIAGNOSIS — Z720 Tobacco use: Secondary | ICD-10-CM | POA: Diagnosis present

## 2021-01-30 DIAGNOSIS — Z90710 Acquired absence of both cervix and uterus: Secondary | ICD-10-CM

## 2021-01-30 DIAGNOSIS — G4733 Obstructive sleep apnea (adult) (pediatric): Secondary | ICD-10-CM | POA: Diagnosis present

## 2021-01-30 DIAGNOSIS — Z9841 Cataract extraction status, right eye: Secondary | ICD-10-CM

## 2021-01-30 DIAGNOSIS — I69398 Other sequelae of cerebral infarction: Secondary | ICD-10-CM

## 2021-01-30 DIAGNOSIS — K219 Gastro-esophageal reflux disease without esophagitis: Secondary | ICD-10-CM | POA: Diagnosis present

## 2021-01-30 DIAGNOSIS — K5900 Constipation, unspecified: Secondary | ICD-10-CM | POA: Diagnosis not present

## 2021-01-30 DIAGNOSIS — I69322 Dysarthria following cerebral infarction: Secondary | ICD-10-CM | POA: Diagnosis not present

## 2021-01-30 DIAGNOSIS — I10 Essential (primary) hypertension: Secondary | ICD-10-CM

## 2021-01-30 LAB — GLUCOSE, CAPILLARY
Glucose-Capillary: 108 mg/dL — ABNORMAL HIGH (ref 70–99)
Glucose-Capillary: 139 mg/dL — ABNORMAL HIGH (ref 70–99)
Glucose-Capillary: 135 mg/dL — ABNORMAL HIGH (ref 70–99)
Glucose-Capillary: 107 mg/dL — ABNORMAL HIGH (ref 70–99)
Glucose-Capillary: 199 mg/dL — ABNORMAL HIGH (ref 70–99)

## 2021-01-30 MED ORDER — BLOOD PRESSURE CONTROL BOOK
Freq: Once | Status: AC
  Filled 2021-01-30: qty 1

## 2021-01-30 MED ORDER — DIPHENHYDRAMINE HCL 12.5 MG/5ML PO ELIX: 12.5000 mg | ORAL_SOLUTION | Freq: Four times a day (QID) | ORAL | Status: DC | PRN

## 2021-01-30 MED ORDER — PROCHLORPERAZINE 25 MG RE SUPP: 12.5000 mg | Freq: Four times a day (QID) | RECTAL | Status: DC | PRN

## 2021-01-30 MED ORDER — EZETIMIBE 10 MG PO TABS
10.0000 mg | ORAL_TABLET | Freq: Every day | ORAL | Status: DC
  Administered 2021-01-30 – 2021-02-10 (×12): 10 mg via ORAL
  Filled 2021-01-30 (×13): qty 1

## 2021-01-30 MED ORDER — ACETAMINOPHEN 325 MG PO TABS: 650.0000 mg | ORAL_TABLET | ORAL | Status: DC | PRN

## 2021-01-30 MED ORDER — ISOSORBIDE MONONITRATE ER 30 MG PO TB24
15.0000 mg | ORAL_TABLET | Freq: Every day | ORAL | Status: DC
  Administered 2021-01-31 – 2021-02-11 (×12): 15 mg via ORAL
  Filled 2021-01-30 (×12): qty 1

## 2021-01-30 MED ORDER — ESCITALOPRAM OXALATE 10 MG PO TABS
10.0000 mg | ORAL_TABLET | Freq: Every day | ORAL | Status: DC
  Administered 2021-01-30 – 2021-02-10 (×12): 10 mg via ORAL
  Filled 2021-01-30 (×13): qty 1

## 2021-01-30 MED ORDER — GUAIFENESIN-DM 100-10 MG/5ML PO SYRP: 5.0000 mL | ORAL_SOLUTION | Freq: Four times a day (QID) | ORAL | Status: DC | PRN

## 2021-01-30 MED ORDER — BISACODYL 10 MG RE SUPP: 10.0000 mg | Freq: Every day | RECTAL | Status: DC | PRN

## 2021-01-30 MED ORDER — LISINOPRIL 20 MG PO TABS
20.0000 mg | ORAL_TABLET | Freq: Every day | ORAL | Status: DC
  Administered 2021-01-31 – 2021-02-11 (×12): 20 mg via ORAL
  Filled 2021-01-30 (×12): qty 1

## 2021-01-30 MED ORDER — ATORVASTATIN CALCIUM 80 MG PO TABS
80.0000 mg | ORAL_TABLET | Freq: Every day | ORAL | Status: DC
  Administered 2021-01-31 – 2021-02-11 (×12): 80 mg via ORAL
  Filled 2021-01-30 (×12): qty 1

## 2021-01-30 MED ORDER — PROCHLORPERAZINE EDISYLATE 10 MG/2ML IJ SOLN: 5.0000 mg | Freq: Four times a day (QID) | INTRAMUSCULAR | Status: DC | PRN

## 2021-01-30 MED ORDER — ASPIRIN EC 325 MG PO TBEC
325.0000 mg | DELAYED_RELEASE_TABLET | Freq: Every day | ORAL | Status: DC
  Administered 2021-01-31 – 2021-02-11 (×12): 325 mg via ORAL
  Filled 2021-01-30 (×12): qty 1

## 2021-01-30 MED ORDER — TOPIRAMATE 25 MG PO TABS
75.0000 mg | ORAL_TABLET | Freq: Two times a day (BID) | ORAL | Status: DC
  Administered 2021-01-30 – 2021-02-11 (×24): 75 mg via ORAL
  Filled 2021-01-30 (×25): qty 3

## 2021-01-30 MED ORDER — PROCHLORPERAZINE MALEATE 5 MG PO TABS: 5.0000 mg | ORAL_TABLET | Freq: Four times a day (QID) | ORAL | Status: DC | PRN

## 2021-01-30 MED ORDER — PANTOPRAZOLE SODIUM 40 MG PO TBEC
40.0000 mg | DELAYED_RELEASE_TABLET | Freq: Every day | ORAL | Status: DC
  Administered 2021-01-31 – 2021-02-11 (×12): 40 mg via ORAL
  Filled 2021-01-30 (×12): qty 1

## 2021-01-30 MED ORDER — ENOXAPARIN SODIUM 40 MG/0.4ML IJ SOSY
40.0000 mg | PREFILLED_SYRINGE | INTRAMUSCULAR | Status: DC
  Administered 2021-01-30 – 2021-02-10 (×12): 40 mg via SUBCUTANEOUS
  Filled 2021-01-30 (×13): qty 0.4

## 2021-01-30 MED ORDER — CLOPIDOGREL BISULFATE 75 MG PO TABS
75.0000 mg | ORAL_TABLET | Freq: Every day | ORAL | Status: DC
  Administered 2021-01-31 – 2021-02-11 (×12): 75 mg via ORAL
  Filled 2021-01-30 (×12): qty 1

## 2021-01-30 MED ORDER — ALUM & MAG HYDROXIDE-SIMETH 200-200-20 MG/5ML PO SUSP: 30.0000 mL | ORAL | Status: DC | PRN

## 2021-01-30 MED ORDER — POLYETHYLENE GLYCOL 3350 17 G PO PACK: 17.0000 g | PACK | Freq: Every day | ORAL | Status: DC | PRN

## 2021-01-30 MED ORDER — LEVETIRACETAM 500 MG PO TABS
500.0000 mg | ORAL_TABLET | Freq: Two times a day (BID) | ORAL | Status: DC
  Administered 2021-01-30 – 2021-02-11 (×24): 500 mg via ORAL
  Filled 2021-01-30 (×25): qty 1

## 2021-01-30 MED ORDER — CARVEDILOL 3.125 MG PO TABS
3.1250 mg | ORAL_TABLET | Freq: Two times a day (BID) | ORAL | Status: DC
  Administered 2021-01-30 – 2021-02-05 (×11): 3.125 mg via ORAL
  Filled 2021-01-30 (×14): qty 1

## 2021-01-30 MED ORDER — TRAZODONE HCL 50 MG PO TABS
25.0000 mg | ORAL_TABLET | Freq: Every evening | ORAL | Status: DC | PRN
Start: 2021-01-30 — End: 2021-02-11

## 2021-01-30 MED ORDER — METFORMIN HCL ER 500 MG PO TB24
500.0000 mg | ORAL_TABLET | Freq: Every day | ORAL | Status: DC
  Administered 2021-01-31 – 2021-02-11 (×12): 500 mg via ORAL
  Filled 2021-01-30 (×13): qty 1

## 2021-01-30 MED ORDER — ACETAMINOPHEN 325 MG PO TABS: 325.0000 mg | ORAL_TABLET | ORAL | Status: DC | PRN

## 2021-01-30 MED ORDER — SODIUM BICARBONATE 650 MG PO TABS
650.0000 mg | ORAL_TABLET | Freq: Two times a day (BID) | ORAL | Status: AC
  Administered 2021-01-30: 650 mg via ORAL
  Filled 2021-01-30: qty 1

## 2021-01-30 MED ORDER — POLYETHYLENE GLYCOL 3350 17 G PO PACK
17.0000 g | PACK | Freq: Two times a day (BID) | ORAL | Status: DC
  Administered 2021-01-30 – 2021-02-11 (×23): 17 g via ORAL
  Filled 2021-01-30 (×25): qty 1

## 2021-01-30 MED ORDER — FLEET ENEMA 7-19 GM/118ML RE ENEM: 1.0000 | ENEMA | Freq: Once | RECTAL | Status: DC | PRN

## 2021-01-30 NOTE — Progress Notes (Signed)
Patient has home CPAP on bedside table. Patient stated she would place herself on without assistance.

## 2021-01-30 NOTE — Progress Notes (Signed)
Patient ID: Samantha Huffman, female   DOB: 1956-03-28, 65 y.o.   MRN: [id number] Met with the patient to introduce self and the role of the nurse CM. Reviewed secondary stroke risks including HTN (Multi meds) and DM (A1C 7.2) and HLD (LDL-57) and smoking PTA. Patient noted right side alien arm and right leg weakness but mostly bothered by right eye ptosis. Reports she is interested in smoking cessation and has tried to do so several times. Continue to follow along to discharge to address educational needs and dietary modifications and collaborate with the SW to facilitate preparation for discharge. Margarito Liner, RN

## 2021-01-30 NOTE — Progress Notes (Signed)
Patient ID: Samantha Huffman, female   DOB: 04/02/1956, 65 y.o.   MRN: [id number]  Patient received from [facility]. Able to answer all questions. Arrived via bed. Sanda Linger, LPN

## 2021-01-30 NOTE — Progress Notes (Signed)
Inpatient Rehab Admissions Coordinator:    I have insurance auth and a bed for this Pt. On CIR today. RN may call report to [phone].  Clemens Catholic, [place], [place] Admissions Coordinator  [phone] ([place]) [phone] (office)

## 2021-01-30 NOTE — Progress Notes (Signed)
PMR Admission Coordinator Pre-Admission Assessment   Patient: Samantha Huffman is an 65 y.o., female MRN: [id number] DOB: 1955/09/18 Height:   Weight: 65.7 kg                                                                                                                                                  Insurance Information HMO:     PPO: yes     PCP:      IPA:      80/20:      OTHER: PRIMARY: [employer] Medicare      Policy#: [id number]      Subscriber: pt CM Name: Dyann Kief     Phone#: [phone] option 5    Fax#: [phone] I received a call from [facility] appeals stating Pt. Was approved 6/9-6/13 on 6/10 and 8:40AM. Clinical updates are due to Case Manager Dyann Kief. Pre-Cert#: [id number]      Employer: Benefits:  Phone #: [phone]     Name: 6/8 Eff. Date: 08/23/2020     Deduct: $233      Out of Pocket Max: $2500      Life Max: none  CIR: 100%      SNF: 100% Outpatient: $20 copay per visit     Co-Pay: visits per medcial neccesity Home Health: 100%      Co-Pay: visits per medcial necceisty DME: 100%     Co-Pay: none Providers: in network  SECONDARY: none      Policy#:       Phone#:   Development worker, community:       Phone#:    The Engineer, petroleum" for patients in Inpatient Rehabilitation Facilities with attached "Privacy Act [place] Records" was provided and verbally reviewed with: Patient and Family   Emergency Contact Information Contact Information       Name Relation Home Work [place] Daughter [phone]   [phone]         Current Medical History  Patient Admitting Diagnosis: CVA   History of Present Illness:  65 y.o. female is a with history of HTN, CAD s/p CABG, T2DM, seizures, migraines, exc bilateral cataracts in the past month;  who was admitted on 01/20/21 with reports of RUE/RLE weakness and unsteady gait for 2-3 days. She reported having lifted a heavy case of water the day before and started feeling dizzy. CTA  head/neck showed proximal occlusion of R-SCA and no LVO.  MRI brain done revealing acute/subacute right cerebellar stroke and smaller infarct right PICA and left thalamus.   2D echo showed EF 60-65% with trivial MVR.  Dr. Erlinda Hong question vertebral dissection as cause of stroke and recommended repeating CT head in 2 days to r/o hydrocephalus and to increase DAPT to ASA 325 mg/Plavix 75 X 3 months followed by lower ASA dose. Therapy evaluations completed revealing ataxic gait with  balance deficits,  decreased coordination on the right as well as tachycardia with activity. CIR recommended due to functional decline.    Complete NIHSS TOTAL: 2 Glasgow Coma Scale Score: 15   Past Medical History      Past Medical History:  Diagnosis Date   Carotid artery stenosis      1-39% bilateral stenosis by dopplers 08/2020   Coronary artery disease      a. s/p CABG 10/2012.  cath 05/2016 with moderate LM stenosis, mild LCx and RCA stenosis and occluded LAD with patent LIMA to LAD, free radial to OM.  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   Dyslipidemia     Family history of adverse reaction to anesthesia      mother and sister ponv   Generalized convulsive epilepsy without mention of intractable epilepsy     GERD (gastroesophageal reflux disease)     History of kidney stones     Hypertension     Memory deficit      slight   Migraine      "controlled on daily RX " (06/11/2016)   Nephrolithiasis 07/30/2015    S/p lithotripsy x 3 and right and left ureter stents   OSA (obstructive sleep apnea)      cpap   PONV (postoperative nausea and vomiting)     Seizures ([place])      "controlled w/daily RX; started in 2012; dr thinks they might be from the migraines; last sz was early part of 2016" (06/11/2016)   Type II diabetes mellitus ([place])      metformin   Vasovagal syncope 07/30/2015    2011      Family History  family history includes Cancer in her father; Diabetes in her brother, brother, and father; Heart  attack in her sister; Heart disease in her brother and sister; Stroke in her brother.   Prior Rehab/Hospitalizations:  Has the patient had prior rehab or hospitalizations prior to admission? Yes   Has the patient had major surgery during 100 days prior to admission? No   Current Medications    Current Facility-Administered Medications:    stroke: mapping our early stages of recovery book, , Does not apply, Once, Para Skeans, MD    stroke: mapping our early stages of recovery book, , Does not apply, Once, Lora Havens, MD   acetaminophen (TYLENOL) tablet 650 mg, 650 mg, Oral, Q4H PRN, Para Skeans, MD   aspirin EC tablet 325 mg, 325 mg, Oral, Daily, Rosalin Hawking, MD, 325 mg at 01/29/21 0939   atorvastatin (LIPITOR) tablet 80 mg, 80 mg, Oral, Daily, Florina Ou V, MD, 80 mg at 01/29/21 0939   carvedilol (COREG) tablet 3.125 mg, 3.125 mg, Oral, BID, Elodia Florence., MD, 3.125 mg at 01/29/21 0940   clopidogrel (PLAVIX) tablet 75 mg, 75 mg, Oral, Daily, Lora Havens, MD, 75 mg at 01/29/21 0939   escitalopram (LEXAPRO) tablet 10 mg, 10 mg, Oral, QHS, Florina Ou V, MD, 10 mg at 01/28/21 2334   ezetimibe (ZETIA) tablet 10 mg, 10 mg, Oral, QHS, Florina Ou V, MD, 10 mg at 01/28/21 2334   insulin aspart (novoLOG) injection 0-15 Units, 0-15 Units, Subcutaneous, TID WC, Florina Ou V, MD, 8 Units at 01/29/21 1116   isosorbide mononitrate (IMDUR) 24 hr tablet 15 mg, 15 mg, Oral, Daily, Vann, Jessica U, DO, 15 mg at 01/29/21 0940   labetalol (NORMODYNE) injection 5 mg, 5 mg, Intravenous, Q10 min PRN, Para Skeans, MD,  5 mg at 01/20/21 2023   levETIRAcetam (KEPPRA) tablet 500 mg, 500 mg, Oral, BID, Florina Ou V, MD, 500 mg at 01/29/21 1116   lisinopril (ZESTRIL) tablet 20 mg, 20 mg, Oral, Daily, Eugenie Filler, MD, 20 mg at 01/29/21 0940   pantoprazole (PROTONIX) EC tablet 40 mg, 40 mg, Oral, Daily, Florina Ou V, MD, 40 mg at 01/29/21 0939   polyethylene glycol (MIRALAX /  GLYCOLAX) packet 17 g, 17 g, Oral, BID, Elodia Florence., MD, 17 g at 01/29/21 0941   sodium bicarbonate tablet 650 mg, 650 mg, Oral, BID, Eugenie Filler, MD, 650 mg at 01/29/21 1607   topiramate (TOPAMAX) tablet 75 mg, 75 mg, Oral, BID, Para Skeans, MD, 75 mg at 01/29/21 3710   Patients Current Diet:  Diet Order                  Diet heart healthy/carb modified Room service appropriate? Yes; Fluid consistency: Thin  Diet effective now                         Precautions / Restrictions Precautions Precautions: Fall Precaution Comments: ataxia, RLE/RUE Restrictions Weight Bearing Restrictions: No    Has the patient had 2 or more falls or a fall with injury in the past year?No   Prior Activity Level Limited Community (1-2x/wk): Independent   Prior Functional Level Prior Function Level of Independence: Independent   Self Care: Did the patient need help bathing, dressing, using the toilet or eating?  Independent   Indoor Mobility: Did the patient need assistance with walking from room to room (with or without device)? Independent   Stairs: Did the patient need assistance with internal or external stairs (with or without device)? Independent   Functional Cognition: Did the patient need help planning regular tasks such as shopping or remembering to take medications? Independent   Home Assistive Devices / Equipment Home Assistive Devices/Equipment: None Home Equipment: None   Prior Device Use: Indicate devices/aids used by the patient prior to current illness, exacerbation or injury? None of the above   Current Functional Level Cognition   Arousal/Alertness: Awake/alert Overall Cognitive Status: Impaired/Different from baseline Orientation Level: Oriented X4 Safety/Judgement: Decreased awareness of safety, Decreased awareness of deficits General Comments: Increased time to answer questions and follow commands Attention: Sustained Sustained Attention:  Impaired Sustained Attention Impairment: Verbal basic Memory: Impaired Memory Impairment: Storage deficit Awareness: Impaired Awareness Impairment: Emergent impairment Problem Solving: Impaired Problem Solving Impairment: Verbal basic    Extremity Assessment (includes Sensation/Coordination)   Upper Extremity Assessment: Generalized weakness, RUE deficits/detail, LUE deficits/detail RUE Deficits / Details: strength 3+/5; ataxia wtih movements; compensating with large shoulder movements attempting to to control [facility] RUE Coordination: decreased fine motor, decreased gross motor LUE Deficits / Details: strength 4/5  Lower Extremity Assessment: Defer to PT evaluation, RLE deficits/detail RLE Deficits / Details: ataxia; knee buckling RLE Coordination: decreased gross motor LLE Deficits / Details: Strength 5/5     ADLs   Overall ADL's : Needs assistance/impaired Eating/Feeding: Supervision/ safety, Set up, Sitting Eating/Feeding Details (indicate cue type and reason): sitting upright in bed, pt eating cereal with bananas, pt able to cut up each banana slice with increased time, poor depth perception noted with ataxic movements in RUE Grooming: Min guard, Standing Grooming Details (indicate cue type and reason): standing x3 mins for task Upper Body Bathing: Min guard, Sitting, Cueing for safety, Cueing for sequencing Upper Body Bathing Details (indicate cue  type and reason): Pt with cues to hold on with single UE for support Lower Body Bathing: Supervison/ safety, Set up, Sitting/lateral leans Lower Body Bathing Details (indicate cue type and reason): simulated via posterior pericare Upper Body Dressing : Minimal assistance, Sitting Lower Body Dressing: Minimal assistance, Moderate assistance, Sitting/lateral leans, Cueing for safety Toilet Transfer: Minimal assistance, RW, Stand-pivot, BSC Toilet Transfer Details (indicate cue type and reason): MIN A for steadying assist to pivot from  recliner>BSC>EOB Toileting- Clothing Manipulation and Hygiene: Supervision/safety, Set up, Sitting/lateral lean Toileting - Clothing Manipulation Details (indicate cue type and reason): posterior pericare Functional mobility during ADLs: Minimal assistance, Rolling walker General ADL Comments: pt continues to present with impaired RUE MFC, impaired motor planning and atxia in RLE and RUE     Mobility   Overal bed mobility: Needs Assistance Bed Mobility: Supine to Sit Supine to sit: Supervision Sit to supine: Min guard, HOB elevated General bed mobility comments: Supervision for safety with use of bed rails.     Transfers   Overall transfer level: Needs assistance Equipment used: Rolling walker (2 wheeled) Transfers: Sit to/from Stand Sit to Stand: Min guard Stand pivot transfers: Min assist General transfer comment: Min guard for safety. Required cues for safe hand placement.     Ambulation / Gait / Stairs / Wheelchair Mobility   Ambulation/Gait Ambulation/Gait assistance: Min assist, Mod assist, Min guard Gait Distance (Feet): 100 Feet Assistive device: Rolling walker (2 wheeled) Gait Pattern/deviations: Ataxic, Decreased stride length, Narrow base of support, Step-to pattern, Step-through pattern, Decreased dorsiflexion - right General Gait Details: Slow, cautious gait. Initially requiring min A for steadying. As pt fatigued, pt with more notable ataxia in RLE and required up to mod A for steadying. Gait velocity: Decreased Gait velocity interpretation: <1.31 ft/sec, indicative of household ambulator     Posture / Balance Balance Overall balance assessment: Needs assistance Sitting-balance support: Feet supported Sitting balance-Leahy Scale: Good Standing balance support: Bilateral upper extremity supported Standing balance-Leahy Scale: Poor Standing balance comment: Reliant on UE and external support     Special needs/care consideration CPAP at home Hgb A1c 7.1         Previous Home Environment  Living Arrangements: Alone  Lives With: Alone Available Help at Discharge: Family, Available 24 hours/day Type of Home: Apartment Home Layout: One level Home Access: Level entry Bathroom Shower/Tub: Chiropodist: Standard Bathroom Accessibility: Yes How Accessible: Accessible via walker Home Care Services: No Additional Comments: lived alone in apartment   Discharge Living Setting Plans for Discharge Living Setting: Apartment, Alone Type of Home at Discharge: Apartment Discharge Home Layout: One level Discharge Home Access: Level entry Discharge Bathroom Shower/Tub: Tub/shower unit Discharge Bathroom Toilet: Standard Discharge Bathroom Accessibility: Yes How Accessible: Accessible via walker Does the patient have any problems obtaining your medications?: No   Social/Family/Support Systems Contact Information: daughter, [name] Anticipated Caregiver: daughter Anticipated Caregiver's Contact Information: [phone] Ability/Limitations of Caregiver: daughter works from home Caregiver Availability: 24/7 Discharge Plan Discussed with Primary Caregiver: Yes Is Caregiver In Agreement with Plan?: Yes Does Caregiver/Family have Issues with Lodging/Transportation while Pt is in Rehab?: No   Daughter has sold her home and is moving out 6/30. Closing on her new home in 2 weeks after that. Daughter aware that 24/7 supervision is recommended after a CIR admit and she states she will arrange. Daughter works from home.   Goals Patient/Family Goal for Rehab: supervision to min asisst with PT and OT Expected length of stay: ELOS 10 to  14 days Program Orientation Provided & Reviewed with Pt/Caregiver Including Roles  & Responsibilities: Yes   Decrease burden of Care through IP rehab admission: n/a   Possible need for SNF placement upon discharge:not antiicapted   Patient Condition: This patient's medical and functional status has changed since  the consult dated: 01/22/2021 in which the Rehabilitation Physician determined and documented that the patient's condition is appropriate for intensive rehabilitative care in an inpatient rehabilitation facility. See "History of Present Illness" (above) for medical update. Functional changes are Pt ambulating 100 ft with mod A-min G assist. Patient's medical and functional status update has been discussed with the Rehabilitation physician and patient remains appropriate for inpatient rehabilitation. Will admit to inpatient rehab today.   Preadmission Screen Completed By: Danne Baxter RN MSN with updates by Julious Payer, Audelia Acton, RN, 01/29/2021 2:35 PM ______________________________________________________________________   Discussed status with Dr. Naaman Plummer on 01/30/2021 at 945 and received approval for admission today.   Admission Coordinator: Danne Baxter RN MSN with updates by Cleatrice Burke,  with updates by Clemens Catholic, [place] CCC-SLP time 1000 [address] Grumbling 01/30/2021            Cosigned by: Meredith Staggers, MD at 01/30/2021 10:40 AM

## 2021-01-30 NOTE — Plan of Care (Signed)
  Problem: Education: Goal: Knowledge of secondary prevention will improve Outcome: Adequate for Discharge   Problem: Education: Goal: Knowledge of General Education information will improve Description: Including pain rating scale, medication(s)/side effects and non-pharmacologic comfort measures Outcome: Adequate for Discharge   Problem: Health Behavior/Discharge Planning: Goal: Ability to manage health-related needs will improve Outcome: Adequate for Discharge   Problem: Clinical Measurements: Goal: Ability to maintain clinical measurements within normal limits will improve Outcome: Adequate for Discharge Goal: Will remain free from infection Outcome: Adequate for Discharge Goal: Diagnostic test results will improve Outcome: Adequate for Discharge Goal: Respiratory complications will improve Outcome: Adequate for Discharge Goal: Cardiovascular complication will be avoided Outcome: Adequate for Discharge

## 2021-01-30 NOTE — H&P (Signed)
Physical Medicine and Rehabilitation Admission H&P     CC: Stroke with functional deficits     HPI: Samantha Huffman is a 65 year old RH-female with history of CAD, HTN, T2DM, OSA, renal calculi, seizure d/o; who was admitted on 01/20/21 with onset of weakness right side with unsteady gait for 2-3 days. She reported having lifted a case of water the day before and started feeling dizzy. She ws found to have occlusion of R-SCA and acute/subacute right cerebellar stroke with small infarcts in right PICA and left thalamus. 2 D echo showed EF 60-65% with trivial MVR. DR. Xu questioned vertebral dissection as cause of stroke and repeat CT head in 2 days to rule out hydrocephalus. ASA increased to 325 mg with recommendations of DAPT X 3 months followed by lower dose ASA/Plavix as per home regimen.  Follow up CT head 06/3 and 06/05 showed stable right cerebellar and thalamic infarcts with out hemorrhage or mass effect. She continues to have deficits due to right sided weakness with ataxia & sensory deficits, dizziness as well as visual deficits affecting ADLs and mobility. CIR recommended due to functional decline.       Review of Systems Constitutional:  Negative for chills and fever. HENT:  Negative for hearing loss.   Eyes:  Positive for blurred vision. Respiratory:  Negative for cough and shortness of breath.   Cardiovascular:  Negative for chest pain and palpitations. Gastrointestinal:  Negative for constipation, heartburn and nausea. Genitourinary:  Negative for dysuria and urgency. Musculoskeletal:  Negative for myalgias and neck pain. Neurological:  Positive for dizziness, sensory change and weakness. Negative for headaches. Psychiatric/Behavioral:  The patient is not nervous/anxious.           Past Medical History:  Diagnosis Date   Carotid artery stenosis      1-39% bilateral stenosis by dopplers 08/2020   Coronary artery disease      a. s/p CABG 10/2012.  cath 05/2016 with moderate  LM stenosis, mild LCx and RCA stenosis and occluded LAD with patent LIMA to LAD, free radial to OM.  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   Dyslipidemia     Family history of adverse reaction to anesthesia      mother and sister ponv   Generalized convulsive epilepsy without mention of intractable epilepsy     GERD (gastroesophageal reflux disease)     History of kidney stones     Hypertension     Memory deficit      slight   Migraine      "controlled on daily RX " (06/11/2016)   Nephrolithiasis 07/30/2015    S/p lithotripsy x 3 and right and left ureter stents   OSA (obstructive sleep apnea)      cpap   PONV (postoperative nausea and vomiting)     Seizures ([place])      "controlled w/daily RX; started in 2012; dr thinks they might be from the migraines; last sz was early part of 2016" (06/11/2016)   Type II diabetes mellitus ([place])      metformin   Vasovagal syncope 07/30/2015    2011           Past Surgical History:  Procedure Laterality Date   CARDIAC CATHETERIZATION   2014; 06/11/2016   CARDIAC CATHETERIZATION N/A 06/11/2016    Procedure: Left Heart Cath and Cors/Grafts Angiography;  Surgeon: Sherren Mocha, MD;  Location: [facility];  Service: Cardiovascular;  Laterality: N/A;  CESAREAN SECTION       CORONARY ANGIOPLASTY       CORONARY ARTERY BYPASS GRAFT   March, 2014    LIMA to LAD, left radial to LCx x 2   CYSTOSCOPY W/ URETERAL STENT PLACEMENT Left 01/16/2019    Procedure: CYSTOSCOPY WITH RETROGRADE PYELOGRAM/URETERAL LEFT STENT PLACEMENT;  Surgeon: Ardis Hughs, MD;  Location: [facility];  Service: Urology;  Laterality: Left;   CYSTOSCOPY/URETEROSCOPY/HOLMIUM LASER/STENT PLACEMENT Left 01/24/2019    Procedure: CYSTOSCOPY, LEFT URETEROSCOPY, HOLMIUM LASER, STONE REMOVAL AND STENT EXCHANGE;  Surgeon: Ardis Hughs, MD;  Location: [facility];  Service: Urology;  Laterality: Left;   LITHOTRIPSY   X 3   RIGHT/LEFT HEART CATH AND CORONARY/GRAFT ANGIOGRAPHY N/A  09/08/2018    Procedure: RIGHT/LEFT HEART CATH AND CORONARY/GRAFT ANGIOGRAPHY;  Surgeon: Martinique, Peter M, MD;  Location: [facility];  Service: Cardiovascular;  Laterality: N/A;   TOTAL ABDOMINAL HYSTERECTOMY        ovaries took before hysterectomy   TUBAL LIGATION       URETERAL STENT PLACEMENT               Family History  Problem Relation Age of Onset   Cancer Father     Diabetes Father     Diabetes Brother     Stroke Brother     Diabetes Brother     Heart disease Brother          CAD with CABG   Heart disease Sister          CAD with MI   Heart attack Sister        Social History:  Moved to [place] in 2016 and lives with daughter. 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 She \ reports that she has been smoking I.5 PPD cigarettes. She has been smoking since she was 65 years old.  She has  never used smokeless tobacco.  She reports current alcohol use--occasional beer or wine occassionally.  She reports that she does not use drugs.            Allergies  Allergen Reactions   Depakote [Divalproex Sodium] Other (See Comments)      Hyperammonemia   Other Other (See Comments)   Penicillins Hives, Other (See Comments) and Rash      Other reaction(s): GI intolerance Has patient had a PCN reaction causing immediate rash, facial/tongue/throat swelling, SOB or lightheadedness with hypotension: YES Has patient had a PCN reaction causing severe rash involving mucus membranes or skin necrosis: NO Has patient had a PCN reaction that required hospitalizationNO Has patient had a PCN reaction occurring within the last 10 years: NO If all of the above answers are "NO", then may proceed with Cephalosporin use. Other reaction(s): GI Upset (intolerance) Other reaction(s): GI intolerance Has patient had a PCN reaction causing immediate rash, facial/tongue/throat swelling, SOB or lightheadedness with hypotension: YES Has patient had a PCN reaction causing severe rash  involving mucus membranes or skin necrosis: NO Has patient had a PCN reaction that required hospitalizationNO Has patient had a PCN reaction occurring within the last 10 years: NO If all of the above answers are "NO", then may proceed with Cephalosporin use.            Medications Prior to Admission  Medication Sig Dispense Refill   ketorolac (ACULAR) 0.5 % ophthalmic solution Place 1 drop into both eyes 3 (three) times daily. Starting 2 days prior to surgery and 3 weeks following  surgery.       ofloxacin (OCUFLOX) 0.3 % ophthalmic solution Place 1 drop into both eyes 3 (three) times daily. Starting 2 days prior to surgery and 3 weeks following surgery.       prednisoLONE acetate (PRED FORTE) 1 % ophthalmic suspension Place 1 drop into both eyes 3 (three) times daily. Starting 2 days prior to surgery and 3 weeks following surgery.       acetaminophen (TYLENOL) 325 MG tablet Take 2 tablets (650 mg total) by mouth every 4 (four) hours as needed for headache or mild pain. (Patient not taking: Reported on 01/20/2021)       Alirocumab (PRALUENT) 75 MG/ML SOAJ INJECT CONTENTS OF 1 PEN INTO THE SKIN EVERY 14 DAYS (Patient not taking: Reported on 01/20/2021) 2 mL 11   ALPRAZolam (XANAX) 0.25 MG tablet Take 0.25 mg by mouth 3 (three) times daily as needed for anxiety.  (Patient not taking: Reported on 01/20/2021)   0   aspirin EC 81 MG tablet Take 81 mg by mouth daily. (Patient not taking: Reported on 01/20/2021)       atorvastatin (LIPITOR) 80 MG tablet Take 1 tablet (80 mg total) by mouth daily. (Patient not taking: Reported on 01/20/2021) 90 tablet 2   carvedilol (COREG) 12.5 MG tablet Take 1 tablet (12.5 mg total) by mouth 2 (two) times daily. (Patient not taking: Reported on 01/20/2021) 180 tablet 2   clopidogrel (PLAVIX) 75 MG tablet Take 1 tablet (75 mg total) by mouth daily. (Patient not taking: Reported on 01/20/2021) 90 tablet 2   escitalopram (LEXAPRO) 10 MG tablet Take 10 mg by mouth at bedtime.   (Patient not taking: Reported on 01/20/2021)       ezetimibe (ZETIA) 10 MG tablet Take 1 tablet (10 mg total) by mouth at bedtime. (Patient not taking: Reported on 01/20/2021) 90 tablet 3   furosemide (LASIX) 20 MG tablet Take 20 mg by mouth daily. (Patient not taking: Reported on 01/20/2021)       isosorbide mononitrate (IMDUR) 120 MG 24 hr tablet Take 1 tablet (120 mg total) by mouth daily. (Patient not taking: Reported on 01/20/2021) 90 tablet 2   levETIRAcetam (KEPPRA) 500 MG tablet Take 1 tablet (500 mg total) by mouth 2 (two) times daily. (Patient not taking: Reported on 01/20/2021) 180 tablet 3   lisinopril (ZESTRIL) 20 MG tablet Take 1 tablet (20 mg total) by mouth daily. (Patient not taking: Reported on 01/20/2021) 90 tablet 2   metFORMIN (GLUCOPHAGE-XR) 500 MG 24 hr tablet Take 500 mg by mouth daily with breakfast.  (Patient not taking: Reported on 01/20/2021)   12   Nicotine 21-14-7 MG/24HR KIT Use as directed (Patient not taking: Reported on 01/20/2021) 1 kit 0   nitroGLYCERIN (NITROSTAT) 0.4 MG SL tablet Place 1 tablet (0.4 mg total) under the tongue every 5 (five) minutes x 3 doses as needed for chest pain. (Patient not taking: Reported on 01/20/2021) 25 tablet 4   pantoprazole (PROTONIX) 40 MG tablet TAKE 1 TABLET BY MOUTH EVERY DAY (Patient not taking: Reported on 01/20/2021) 90 tablet 2   Potassium Citrate 15 MEQ (1620 MG) TBCR Take 1 tablet by mouth in the morning and at bedtime. (Patient not taking: Reported on 01/20/2021)       topiramate (TOPAMAX) 25 MG tablet Take 3 tablets (75 mg total) by mouth 2 (two) times daily. (Patient not taking: Reported on 01/20/2021) 540 tablet 4      Drug Regimen Review  Drug regimen was reviewed  and remains appropriate with no significant issues identified   Home: Home Living Family/patient expects to be discharged to:: Private residence Living Arrangements: Alone Available Help at Discharge: Family, Available 24 hours/day Type of Home: Apartment Home  Access: Level entry Home Layout: One level Bathroom Shower/Tub: Government social research officer Accessibility: Yes Home Equipment: None Additional Comments: lived alone in apartment  Lives With: Alone   Functional History: Prior Function Level of Independence: Independent   Functional Status:  Mobility: Bed Mobility Overal bed mobility: Needs Assistance Bed Mobility: Supine to Sit Supine to sit: Supervision Sit to supine: Min guard, HOB elevated General bed mobility comments: Supervision for safety with use of bed rails. Transfers Overall transfer level: Needs assistance Equipment used: Rolling walker (2 wheeled) Transfers: Sit to/from Stand Sit to Stand: Min guard Stand pivot transfers: Min assist General transfer comment: Min guard for safety. Required cues for safe hand placement. Ambulation/Gait Ambulation/Gait assistance: Min assist, Mod assist, Min guard Gait Distance (Feet): 100 Feet Assistive device: Rolling walker (2 wheeled) Gait Pattern/deviations: Ataxic, Decreased stride length, Narrow base of support, Step-to pattern, Step-through pattern, Decreased dorsiflexion - right General Gait Details: Slow, cautious gait. Initially requiring min A for steadying. As pt fatigued, pt with more notable ataxia in RLE and required up to mod A for steadying. Gait velocity: Decreased Gait velocity interpretation: <1.31 ft/sec, indicative of household ambulator   ADL: ADL Overall ADL's : Needs assistance/impaired Eating/Feeding: Supervision/ safety, Set up, Sitting Eating/Feeding Details (indicate cue type and reason): sitting upright in bed, pt eating cereal with bananas, pt able to cut up each banana slice with increased time, poor depth perception noted with ataxic movements in RUE Grooming: Min guard, Standing Grooming Details (indicate cue type and reason): standing x3 mins for task Upper Body Bathing: Min guard, Sitting, Cueing for safety, Cueing for  sequencing Upper Body Bathing Details (indicate cue type and reason): Pt with cues to hold on with single UE for support Lower Body Bathing: Supervison/ safety, Set up, Sitting/lateral leans Lower Body Bathing Details (indicate cue type and reason): simulated via posterior pericare Upper Body Dressing : Minimal assistance, Sitting Lower Body Dressing: Minimal assistance, Moderate assistance, Sitting/lateral leans, Cueing for safety Toilet Transfer: Minimal assistance, RW, Stand-pivot, BSC Toilet Transfer Details (indicate cue type and reason): MIN A for steadying assist to pivot from recliner>BSC>EOB Toileting- Clothing Manipulation and Hygiene: Supervision/safety, Set up, Sitting/lateral lean Toileting - Clothing Manipulation Details (indicate cue type and reason): posterior pericare Functional mobility during ADLs: Minimal assistance, Rolling walker General ADL Comments: pt continues to present with impaired RUE MFC, impaired motor planning and atxia in RLE and RUE   Cognition: Cognition Overall Cognitive Status: Impaired/Different from baseline Arousal/Alertness: Awake/alert Orientation Level: Oriented X4 Attention: Sustained Sustained Attention: Impaired Sustained Attention Impairment: Verbal basic Memory: Impaired Memory Impairment: Storage deficit Awareness: Impaired Awareness Impairment: Emergent impairment Problem Solving: Impaired Problem Solving Impairment: Verbal basic Cognition Arousal/Alertness: Awake/alert Behavior During Therapy: WFL for tasks assessed/performed Overall Cognitive Status: Impaired/Different from baseline Area of Impairment: Awareness, Problem solving Safety/Judgement: Decreased awareness of safety, Decreased awareness of deficits Awareness: Emergent Problem Solving: Slow processing, Difficulty sequencing, Requires verbal cues General Comments: Increased time to answer questions and follow commands       Blood pressure (!) 140/92, pulse 74,  temperature 97.9 F (36.6 C), temperature source Oral, resp. rate 16, weight 65.7 kg, SpO2 99 %. Physical Exam Vitals and nursing note reviewed. Constitutional:      Appearance: Normal appearance. She is obese.  Comments: Up in chair eating lunch.   HENT:    Head: Normocephalic and atraumatic.    Nose: Nose normal.    Mouth/Throat:    Mouth: Mucous membranes are moist. Eyes:    Extraocular Movements: Extraocular movements intact.    Pupils: Pupils are equal, round, and reactive to light. Cardiovascular:    Rate and Rhythm: Normal rate and regular rhythm.    Heart sounds: No murmur heard.   No gallop. Pulmonary:    Effort: Pulmonary effort is normal. No respiratory distress.    Breath sounds: No wheezing. Abdominal:    General: There is no distension. Musculoskeletal:        General: No swelling or tenderness. Normal range of motion.    Cervical back: Normal range of motion. Skin:    General: Skin is warm and dry. Neurological:    Mental Status: She is alert and oriented to person, place, and time.    Comments: Speech slow and measured. Mild left facial weakness with mild dysarthria. She is able to follow simple commands without difficulty. A little slow to process.  Right sided weakness with ataxia noted in both RUE and RLE. 4/5 RUE and 5/5 LUE. RLE 4/5, LLE 5/5.   Psychiatric:        Mood and Affect: Mood normal.        Behavior: Behavior normal.     Lab Results Last 48 Hours        Results for orders placed or performed during the hospital encounter of 01/20/21 (from the past 48 hour(s))  Glucose, capillary     Status: Abnormal    Collection Time: 01/28/21 11:01 AM  Result Value Ref Range    Glucose-Capillary 151 (H) 70 - 99 mg/dL      Comment: Glucose reference range applies only to samples taken after fasting for at least 8 hours.  Glucose, capillary     Status: None    Collection Time: 01/28/21  5:00 PM  Result Value Ref Range    Glucose-Capillary 78 70 - 99  mg/dL      Comment: Glucose reference range applies only to samples taken after fasting for at least 8 hours.  Glucose, capillary     Status: Abnormal    Collection Time: 01/28/21  9:48 PM  Result Value Ref Range    Glucose-Capillary 115 (H) 70 - 99 mg/dL      Comment: Glucose reference range applies only to samples taken after fasting for at least 8 hours.  Basic metabolic panel     Status: Abnormal    Collection Time: 01/29/21  3:59 AM  Result Value Ref Range    Sodium 137 135 - 145 mmol/L    Potassium 4.4 3.5 - 5.1 mmol/L    Chloride 109 98 - 111 mmol/L    CO2 19 (L) 22 - 32 mmol/L    Glucose, Bld 125 (H) 70 - 99 mg/dL      Comment: Glucose reference range applies only to samples taken after fasting for at least 8 hours.    BUN 21 8 - 23 mg/dL    Creatinine, Ser 1.08 (H) 0.44 - 1.00 mg/dL    Calcium 9.2 8.9 - 10.3 mg/dL    GFR, Estimated 57 (L) >60 mL/min      Comment: (NOTE) Calculated using the CKD-EPI Creatinine Equation (2021)      Anion gap 9 5 - 15      Comment: Performed at [facility] [address]  [place], [place] [address]  Glucose, capillary     Status: Abnormal    Collection Time: 01/29/21  6:14 AM  Result Value Ref Range    Glucose-Capillary 141 (H) 70 - 99 mg/dL      Comment: Glucose reference range applies only to samples taken after fasting for at least 8 hours.  Glucose, capillary     Status: Abnormal    Collection Time: 01/29/21  7:29 AM  Result Value Ref Range    Glucose-Capillary 151 (H) 70 - 99 mg/dL      Comment: Glucose reference range applies only to samples taken after fasting for at least 8 hours.  Glucose, capillary     Status: Abnormal    Collection Time: 01/29/21 10:59 AM  Result Value Ref Range    Glucose-Capillary 266 (H) 70 - 99 mg/dL      Comment: Glucose reference range applies only to samples taken after fasting for at least 8 hours.  Glucose, capillary     Status: Abnormal    Collection Time: 01/29/21  5:24 PM  Result Value  Ref Range    Glucose-Capillary 150 (H) 70 - 99 mg/dL      Comment: Glucose reference range applies only to samples taken after fasting for at least 8 hours.  Glucose, capillary     Status: Abnormal    Collection Time: 01/29/21  9:48 PM  Result Value Ref Range    Glucose-Capillary 141 (H) 70 - 99 mg/dL      Comment: Glucose reference range applies only to samples taken after fasting for at least 8 hours.  Glucose, capillary     Status: Abnormal    Collection Time: 01/30/21  6:07 AM  Result Value Ref Range    Glucose-Capillary 139 (H) 70 - 99 mg/dL      Comment: Glucose reference range applies only to samples taken after fasting for at least 8 hours.      Imaging Results (Last 48 hours)  No results found.           Medical Problem List and Plan: 1.  Fxnl and mobility deficits secondary to right cerebellar and left thalamic infarcts potentially d/t vertebral artery dissection             -patient may shower             -ELOS/Goals: 10-14 days, Sup/min assist with PT, OT 2.  Antithrombotics: -DVT/anticoagulation:  Pharmaceutical: Lovenox             -antiplatelet therapy: ASA/plavix 3. Pain Management: Tylenol prn for HA.  4. Mood: LCSW to follow for evaluation and support.              -antipsychotic agents: N/A 5. Neuropsych: This patient is capable of making decisions on her own behalf. 6. Skin/Wound Care: Routine pressure relief measures.  7. Fluids/Electrolytes/Nutrition:  Monitor I/O. Check CMET in am.  8. HTN: Monitor TID             --continue Lisinopril, coreg and Imdur.             -monitor readings with therapy 9. T2DM: Hgb A1c-7.6.  Monitor BS ac/hs and use SSI for elevated BS.                  --resume home dose metformin.             -adjust regimen as needed             -diabetic education 10 Seizure  disorder: Stable on Keppra bid. 11. Hyperlipidemia: On Statin and Zetia.              --was on Praluent twice a month.  12. Recent bilateral cataract surgery:  Completes eye drops for right eye in a week.  66. CAD s/p CABG 2014 : Monitor for symptoms with increase in activity             --On DAPT, Imdur, Lisinopril, coreg and statin (Praluent on hold) 14. Anxiety d/o: Mood stable on Lexapro. Not using Xanax currently. 15.  OSA: Is compliant with CPAP use.         Bary Leriche, PA-C 01/30/2021   I have personally performed a face to face diagnostic evaluation of this patient and formulated the key components of the plan.  Additionally, I have personally reviewed laboratory data, imaging studies, as well as relevant notes and concur with the physician assistant's documentation above.  The patient's status has not changed from the original H&P.  Any changes in documentation from the acute care chart have been noted above.  Meredith Staggers, MD, [facility]

## 2021-01-30 NOTE — Care Management Important Message (Signed)
Important Message  Patient Details  Name: Samantha Huffman MRN: [id number] Date of Birth: [birth year]   Medicare Important Message Given:  Yes - Important Message mailed due to current National Emergency   Verbal consent obtained due to current National Emergency  Relationship to patient: Self Contact Name: Lorette Call Date: 01/30/21  Time: 7943 Phone: [phone] Outcome: No Answer/Busy Important Message mailed to: Patient address on file    Delorse Lek 01/30/2021, 9:54 AM

## 2021-01-30 NOTE — Progress Notes (Signed)
Inpatient Rehabilitation Medication Review by a Pharmacist  A complete drug regimen review was completed for this patient to identify any potential clinically significant medication issues.  Clinically significant medication issues were identified:  no  Check AMION for pharmacist assigned to patient if future medication questions/issues arise during this admission.  Pharmacist comments: None  Time spent performing this drug regimen review (minutes):  10 minutes  Tad Moore 01/30/2021 2:40 PM

## 2021-01-30 NOTE — Progress Notes (Signed)
Physical Medicine and Rehabilitation Consult     Reason for Consult: Stroke with functional deficits Referring Physician: Dr. Florene Glen     HPI: Samantha Huffman is a 65 y.o. female is a with history of HTN, CAD s/p CABG, T2DM, seizures, migraines, exc bilateral cataracts in the past month;  who was admitted on 01/20/21 with reports of RUE/RLE weakness and unsteady gait for 2-3 days. She reported having lifted a heavy case of water the day before and started feeling dizzy. CTA head/neck showed proximal occlusion of R-SCA and no LVO.  MRI brain done revealing acute/subacute right cerebellar stroke and smaller infarct right PICA and left thalamus.   2D echo showed EF 60-65% with trivial MVR.  Dr. Erlinda Hong question vertebral dissection as cause of stroke and recommended repeating CT head in 2 days to r/o hydrocephalus and to increase DAPT to ASA 325 mg/Plavix 75 X 3 months followed by lower ASA dose. Therapy evaluations completed revealing ataxic gait with balance deficits,  decreased coordination on the right as well as tachycardia with activity. CIR recommended due to functional decline.     Pt had cataract surgery for L eye 1 week ago and R eye 2 weeks ago- still very light sensitive- not sure if has any blurry vision from this already. Wears a CPAP at home- will have family bring in. Per daughter, pt is typically "clear" might have mild dementia/cognitive issues, but if so, very mild. Usually has BM s every day- LBM was Monday. Denies pain. Admits to smoking ~ 1ppd- but had told others .75 ppd- not clear? Lives alone- but daughters both live in [place] and are close by. Daughter [name] is moving to a new home soon.      Review of Systems Constitutional: Negative for chills and fever. HENT: Negative for hearing loss.   Eyes: Positive for blurred vision (On the right and light sensitivity due to recent surgery). Negative for pain.       Had cataract surgery for both eyes in last 2 weeks   Respiratory: Positive for shortness of breath. Negative for cough.   Cardiovascular: Negative for chest pain and palpitations. Gastrointestinal: Negative for constipation and heartburn. Genitourinary: Negative for dysuria and urgency. Musculoskeletal: Negative for back pain, myalgias and neck pain. Skin: Negative for rash. Neurological: Positive for dizziness and weakness. Negative for headaches.  All other systems reviewed and are negative.           Past Medical History:  Diagnosis Date   Carotid artery stenosis      1-39% bilateral stenosis by dopplers 08/2020   Coronary artery disease      a. s/p CABG 10/2012.  cath 05/2016 with moderate LM stenosis, mild LCx and RCA stenosis and occluded LAD with patent LIMA to LAD, free radial to OM.  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   Dyslipidemia     Family history of adverse reaction to anesthesia      mother and sister ponv   Generalized convulsive epilepsy without mention of intractable epilepsy     GERD (gastroesophageal reflux disease)     History of kidney stones     Hypertension     Memory deficit      slight   Migraine      "controlled on daily RX " (06/11/2016)   Nephrolithiasis 07/30/2015    S/p lithotripsy x 3 and right and left ureter stents   OSA (obstructive sleep apnea)  cpap   PONV (postoperative nausea and vomiting)     Seizures ([place])      "controlled w/daily RX; started in 2012; dr thinks they might be from the migraines; last sz was early part of 2016" (06/11/2016)   Type II diabetes mellitus ([place])      metformin   Vasovagal syncope 07/30/2015    2011           Past Surgical History:  Procedure Laterality Date   CARDIAC CATHETERIZATION   2014; 06/11/2016   CARDIAC CATHETERIZATION N/A 06/11/2016    Procedure: Left Heart Cath and Cors/Grafts Angiography;  Surgeon: Sherren Mocha, MD;  Location: [facility];  Service: Cardiovascular;  Laterality: N/A;   CESAREAN SECTION       CORONARY  ANGIOPLASTY       CORONARY ARTERY BYPASS GRAFT   March, 2014    LIMA to LAD, left radial to LCx x 2   CYSTOSCOPY W/ URETERAL STENT PLACEMENT Left 01/16/2019    Procedure: CYSTOSCOPY WITH RETROGRADE PYELOGRAM/URETERAL LEFT STENT PLACEMENT;  Surgeon: Ardis Hughs, MD;  Location: [facility];  Service: Urology;  Laterality: Left;   CYSTOSCOPY/URETEROSCOPY/HOLMIUM LASER/STENT PLACEMENT Left 01/24/2019    Procedure: CYSTOSCOPY, LEFT URETEROSCOPY, HOLMIUM LASER, STONE REMOVAL AND STENT EXCHANGE;  Surgeon: Ardis Hughs, MD;  Location: [facility];  Service: Urology;  Laterality: Left;   LITHOTRIPSY   X 3   RIGHT/LEFT HEART CATH AND CORONARY/GRAFT ANGIOGRAPHY N/A 09/08/2018    Procedure: RIGHT/LEFT HEART CATH AND CORONARY/GRAFT ANGIOGRAPHY;  Surgeon: Martinique, Peter M, MD;  Location: [facility];  Service: Cardiovascular;  Laterality: N/A;   TOTAL ABDOMINAL HYSTERECTOMY        ovaries took before hysterectomy   TUBAL LIGATION       URETERAL STENT PLACEMENT               Family History  Problem Relation Age of Onset   Cancer Father     Diabetes Father     Diabetes Brother     Stroke Brother     Diabetes Brother     Heart disease Brother          CAD with CABG   Heart disease Sister          CAD with MI   Heart attack Sister        Social History: Lives alone and independent without AD. Daughter in town works and questions ability to provide supervision post discharge. She reports that she has been smoking cigarettes--1.5 PPD every 2 days and has been smoking since she was a teenage.  She quit smokeless tobacco use about 8 years ago.  Her smokeless tobacco use included snuff and chew. She reports current alcohol use--1-2 drinks every couple of weeks.  She reports that she does not use drugs.          Allergies  Allergen Reactions   Depakote [Divalproex Sodium] Other (See Comments)      Hyperammonemia   Other Other (See Comments)   Penicillins Hives, Other (See Comments) and Rash       Other reaction(s): GI intolerance Has patient had a PCN reaction causing immediate rash, facial/tongue/throat swelling, SOB or lightheadedness with hypotension: YES Has patient had a PCN reaction causing severe rash involving mucus membranes or skin necrosis: NO Has patient had a PCN reaction that required hospitalizationNO Has patient had a PCN reaction occurring within the last 10 years: NO If all of the above answers are "NO", then  may proceed with Cephalosporin use. Other reaction(s): GI Upset (intolerance) Other reaction(s): GI intolerance Has patient had a PCN reaction causing immediate rash, facial/tongue/throat swelling, SOB or lightheadedness with hypotension: YES Has patient had a PCN reaction causing severe rash involving mucus membranes or skin necrosis: NO Has patient had a PCN reaction that required hospitalizationNO Has patient had a PCN reaction occurring within the last 10 years: NO If all of the above answers are "NO", then may proceed with Cephalosporin use.            Medications Prior to Admission  Medication Sig Dispense Refill   ketorolac (ACULAR) 0.5 % ophthalmic solution Place 1 drop into both eyes 3 (three) times daily. Starting 2 days prior to surgery and 3 weeks following surgery.       ofloxacin (OCUFLOX) 0.3 % ophthalmic solution Place 1 drop into both eyes 3 (three) times daily. Starting 2 days prior to surgery and 3 weeks following surgery.       prednisoLONE acetate (PRED FORTE) 1 % ophthalmic suspension Place 1 drop into both eyes 3 (three) times daily. Starting 2 days prior to surgery and 3 weeks following surgery.       acetaminophen (TYLENOL) 325 MG tablet Take 2 tablets (650 mg total) by mouth every 4 (four) hours as needed for headache or mild pain. (Patient not taking: Reported on 01/20/2021)       Alirocumab (PRALUENT) 75 MG/ML SOAJ INJECT CONTENTS OF 1 PEN INTO THE SKIN EVERY 14 DAYS (Patient not taking: Reported on 01/20/2021) 2 mL 11   ALPRAZolam  (XANAX) 0.25 MG tablet Take 0.25 mg by mouth 3 (three) times daily as needed for anxiety.  (Patient not taking: Reported on 01/20/2021)   0   aspirin EC 81 MG tablet Take 81 mg by mouth daily. (Patient not taking: Reported on 01/20/2021)       atorvastatin (LIPITOR) 80 MG tablet Take 1 tablet (80 mg total) by mouth daily. (Patient not taking: Reported on 01/20/2021) 90 tablet 2   carvedilol (COREG) 12.5 MG tablet Take 1 tablet (12.5 mg total) by mouth 2 (two) times daily. (Patient not taking: Reported on 01/20/2021) 180 tablet 2   clopidogrel (PLAVIX) 75 MG tablet Take 1 tablet (75 mg total) by mouth daily. (Patient not taking: Reported on 01/20/2021) 90 tablet 2   clotrimazole-betamethasone (LOTRISONE) cream Apply 1 application topically daily as needed (skin folds (under breasts)).  (Patient not taking: Reported on 01/20/2021)       escitalopram (LEXAPRO) 10 MG tablet Take 10 mg by mouth at bedtime.  (Patient not taking: Reported on 01/20/2021)       ezetimibe (ZETIA) 10 MG tablet Take 1 tablet (10 mg total) by mouth at bedtime. (Patient not taking: Reported on 01/20/2021) 90 tablet 3   furosemide (LASIX) 20 MG tablet Take 20 mg by mouth daily. (Patient not taking: Reported on 01/20/2021)       isosorbide mononitrate (IMDUR) 120 MG 24 hr tablet Take 1 tablet (120 mg total) by mouth daily. (Patient not taking: Reported on 01/20/2021) 90 tablet 2   levETIRAcetam (KEPPRA) 500 MG tablet Take 1 tablet (500 mg total) by mouth 2 (two) times daily. (Patient not taking: Reported on 01/20/2021) 180 tablet 3   lisinopril (ZESTRIL) 20 MG tablet Take 1 tablet (20 mg total) by mouth daily. (Patient not taking: Reported on 01/20/2021) 90 tablet 2   metFORMIN (GLUCOPHAGE-XR) 500 MG 24 hr tablet Take 500 mg by mouth daily with breakfast.  (Patient  not taking: Reported on 01/20/2021)   12   Nicotine 21-14-7 MG/24HR KIT Use as directed (Patient not taking: Reported on 01/20/2021) 1 kit 0   nitroGLYCERIN (NITROSTAT) 0.4 MG SL tablet  Place 1 tablet (0.4 mg total) under the tongue every 5 (five) minutes x 3 doses as needed for chest pain. (Patient not taking: Reported on 01/20/2021) 25 tablet 4   pantoprazole (PROTONIX) 40 MG tablet TAKE 1 TABLET BY MOUTH EVERY DAY (Patient not taking: Reported on 01/20/2021) 90 tablet 2   Potassium Citrate 15 MEQ (1620 MG) TBCR Take 1 tablet by mouth in the morning and at bedtime. (Patient not taking: Reported on 01/20/2021)       topiramate (TOPAMAX) 25 MG tablet Take 3 tablets (75 mg total) by mouth 2 (two) times daily. (Patient not taking: Reported on 01/20/2021) 540 tablet 4      Home: Home Living Family/patient expects to be discharged to:: Private residence Living Arrangements: Alone Available Help at Discharge: Family,Available PRN/intermittently Type of Home: Apartment Home Access: Level entry Home Layout: One level Bathroom Shower/Tub: Tub/shower unit Home Equipment: None  Functional History: Prior Function Level of Independence: Independent Functional Status:  Mobility: Bed Mobility Overal bed mobility: Needs Assistance Bed Mobility: Sit to Supine Supine to sit: Min guard Sit to supine: Min guard,HOB elevated General bed mobility comments: assist for BLE Transfers Overall transfer level: Needs assistance Equipment used: Rolling walker (2 wheeled),None Transfers: Sit to/from Stand Sit to Stand: Min assist General transfer comment: MinA to rise from EOB and from [facility] Ambulation/Gait Ambulation/Gait assistance: Min assist,Mod assist Gait Distance (Feet): 30 Feet Assistive device: Rolling walker (2 wheeled),None Gait Pattern/deviations: Ataxic,Decreased stride length,Narrow base of support,Step-to pattern General Gait Details: Initially trialed no AD and pt performed pre gait i.e. weight shifts with modA and posterior lean, progressing to minA with use of walker, step to pattern with erratic right step length. Cues provided for sequencing and walker use. Gait velocity:  decreased Gait velocity interpretation: <1.31 ft/sec, indicative of household ambulator   ADL: ADL Overall ADL's : Needs assistance/impaired Eating/Feeding: Set up,Sitting,Bed level Eating/Feeding Details (indicate cue type and reason): supported in bed Grooming: Min guard,Standing Grooming Details (indicate cue type and reason): standing x3 mins for task Upper Body Bathing: Min guard,Sitting,Cueing for safety,Cueing for sequencing Upper Body Bathing Details (indicate cue type and reason): Pt with cues to hold on with single UE for support Lower Body Bathing: Moderate assistance,Cueing for safety,Cueing for sequencing,Sitting/lateral leans,Sit to/from stand Lower Body Bathing Details (indicate cue type and reason): assist for safety Upper Body Dressing : Minimal assistance,Sitting Lower Body Dressing: Moderate assistance,Sitting/lateral leans,Sit to/from stand,Cueing for safety Toilet Transfer: Moderate assistance,Stand-pivot,BSC,RW Toilet Transfer Details (indicate cue type and reason): cues for each step Toileting- Clothing Manipulation and Hygiene: Minimal assistance,Cueing for safety,Sit to/from stand Toileting - Clothing Manipulation Details (indicate cue type and reason): reaching anterior pericare for a few seconds. Functional mobility during ADLs: Moderate assistance,Rolling walker,Cueing for safety,Cueing for sequencing General ADL Comments: Pt limited by decreased coordination/ataxic movements on R side, R LE buckling, decreased ability to care for self and decreased mobility with RW. Pt often staring into space and requiring cues to attend to current task. Pt with delatyed processing and other cognitive impairments. Pt's daughters in room confirming that at baseline, pt is sharp as a tack at home.   Cognition: Cognition Overall Cognitive Status: Impaired/Different from baseline Orientation Level: Oriented X4 Cognition Arousal/Alertness: Awake/alert Behavior During Therapy:  Flat affect Overall Cognitive Status: Impaired/Different from baseline  Area of Impairment: Awareness,Problem solving,Safety/judgement Safety/Judgement: Decreased awareness of safety,Decreased awareness of deficits Awareness: Emergent Problem Solving: Slow processing,Requires verbal cues,Requires tactile cues General Comments: A&Ox4, increased time to respond- at times pt would close eyes with decreased arousal and would easily awaken. Unsure if pt was very fatigued after PT session just before this session.     Blood pressure (!) 180/86, pulse 73, temperature 99.1 F (37.3 C), temperature source Oral, resp. rate 19, SpO2 100 %. Physical Exam Vitals and nursing note reviewed. Exam conducted with a chaperone present.  Constitutional:      Comments: Kept eyes closed for most of the exam.  Pt laying supine in bed- with cataract sunglasses on ; Daughter Keya in room, NAD  HENT:    Head: Normocephalic.     Comments: Smile equal tongue midline    Right Ear: External ear normal.     Left Ear: External ear normal.     Nose: Nose normal. No congestion.     Mouth/Throat:     Mouth: Mucous membranes are dry.     Pharynx: Oropharynx is clear. No oropharyngeal exudate.  Eyes:    General:        Right eye: No discharge.        Left eye: No discharge.     Extraocular Movements: Extraocular movements intact.     Comments: Would barely open eyes for me, fyi  Cardiovascular:    Rate and Rhythm: Normal rate and regular rhythm.     Heart sounds: Normal heart sounds. No murmur heard. No gallop.   Pulmonary:    Comments: CTA B/L- no W/R/R- good air movement  Abdominal:     Comments: Soft, maybe slightly distended; a little TTP in lower quadrants, but no rebound; hypoactive BS  Musculoskeletal:     Cervical back: Normal range of motion and neck supple.     Comments: UEs and LEs' 5/5 in all muscle groups tested  Skin:    General: Skin is warm and dry.     Comments: No skin breakdown seen   Neurological:    Mental Status: She is alert.     Comments: Mild left facial weakness without dysarthria. She was able to answer orientation questions and follow simple commands without difficulty. Kept eyes closed due to visual disturbance on right. Horizontal nystagmus worse to left field. Significant ataxia RUE with attempts at finger to nose as well as ataxic movements RLE.  Pt has typical searching pattern finger to nose on RUE; is OK on LUE; Also very impaired rapid alternating movements on RUE.  Intact to light touch in all 4 extremities  Psychiatric:     Comments: Flat. quiet        Lab Results Last 24 Hours       Results for orders placed or performed during the hospital encounter of 01/20/21 (from the past 24 hour(s))  CBG monitoring, ED     Status: Abnormal    Collection Time: 01/21/21 11:46 AM  Result Value Ref Range    Glucose-Capillary 144 (H) 70 - 99 mg/dL  Glucose, capillary     Status: Abnormal    Collection Time: 01/21/21  4:02 PM  Result Value Ref Range    Glucose-Capillary 147 (H) 70 - 99 mg/dL    Comment 1 Notify RN    Glucose, capillary     Status: Abnormal    Collection Time: 01/21/21  9:02 PM  Result Value Ref Range    Glucose-Capillary 115 (H) 70 -  99 mg/dL  CBC with Differential/Platelet     Status: None    Collection Time: 01/22/21  2:53 AM  Result Value Ref Range    WBC 8.4 4.0 - 10.5 K/uL    RBC 4.62 3.87 - 5.11 MIL/uL    Hemoglobin 14.8 12.0 - 15.0 g/dL    HCT 43.8 36.0 - 46.0 %    MCV 94.8 80.0 - 100.0 fL    MCH 32.0 26.0 - 34.0 pg    MCHC 33.8 30.0 - 36.0 g/dL    RDW 13.1 11.5 - 15.5 %    Platelets 183 150 - 400 K/uL    nRBC 0.0 0.0 - 0.2 %    Neutrophils Relative % 52 %    Neutro Abs 4.3 1.7 - 7.7 K/uL    Lymphocytes Relative 38 %    Lymphs Abs 3.2 0.7 - 4.0 K/uL    Monocytes Relative 7 %    Monocytes Absolute 0.6 0.1 - 1.0 K/uL    Eosinophils Relative 2 %    Eosinophils Absolute 0.2 0.0 - 0.5 K/uL    Basophils Relative 0 %     Basophils Absolute 0.0 0.0 - 0.1 K/uL    Immature Granulocytes 1 %    Abs Immature Granulocytes 0.05 0.00 - 0.07 K/uL  Comprehensive metabolic panel     Status: Abnormal    Collection Time: 01/22/21  2:53 AM  Result Value Ref Range    Sodium 138 135 - 145 mmol/L    Potassium 4.1 3.5 - 5.1 mmol/L    Chloride 106 98 - 111 mmol/L    CO2 23 22 - 32 mmol/L    Glucose, Bld 139 (H) 70 - 99 mg/dL    BUN 20 8 - 23 mg/dL    Creatinine, Ser 1.13 (H) 0.44 - 1.00 mg/dL    Calcium 9.5 8.9 - 10.3 mg/dL    Total Protein 7.0 6.5 - 8.1 g/dL    Albumin 3.9 3.5 - 5.0 g/dL    AST 20 15 - 41 U/L    ALT 18 0 - 44 U/L    Alkaline Phosphatase 106 38 - 126 U/L    Total Bilirubin 0.6 0.3 - 1.2 mg/dL    GFR, Estimated 54 (L) >60 mL/min    Anion gap 9 5 - 15  Magnesium     Status: None    Collection Time: 01/22/21  2:53 AM  Result Value Ref Range    Magnesium 2.0 1.7 - 2.4 mg/dL  Phosphorus     Status: Abnormal    Collection Time: 01/22/21  2:53 AM  Result Value Ref Range    Phosphorus 4.7 (H) 2.5 - 4.6 mg/dL  Glucose, capillary     Status: Abnormal    Collection Time: 01/22/21  6:11 AM  Result Value Ref Range    Glucose-Capillary 148 (H) 70 - 99 mg/dL       Imaging Results (Last 48 hours)  CT HEAD WO CONTRAST   Result Date: 01/20/2021 CLINICAL DATA:  Unsteady gait and leg weakness. EXAM: CT HEAD WITHOUT CONTRAST TECHNIQUE: Contiguous axial images were obtained from the base of the skull through the vertex without intravenous contrast. COMPARISON:  None. FINDINGS: Brain: There is a 4 cm infarct in the superior aspect of the right cerebellar hemisphere consistent with an acute to subacute appearance. Cytotoxic edema results in mild local mass effect. There is also likely a separate subcentimeter subacute infarct more posteriorly in the right cerebellar hemisphere. No intracranial hemorrhage, midline shift,  or extra-axial fluid collection is identified. The ventricles are normal in size. Vascular: Calcified  atherosclerosis at the skull base. Skull: No fracture or suspicious osseous lesion. Sinuses/Orbits: Minimal bilateral ethmoid air cell mucosal thickening. Clear mastoid air cells. Bilateral cataract extraction. Other: None. 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 Electronically Signed   By: Logan Bores M.D.   On: 01/20/2021 13:43   MR ANGIO HEAD WO CONTRAST   Result Date: 01/20/2021 CLINICAL DATA:  Stroke EXAM: MRA HEAD WITHOUT CONTRAST TECHNIQUE: Angiographic images of the Circle of Willis were acquired using MRA technique without intravenous contrast. COMPARISON:  MRI head 01/20/2021 FINDINGS: Anterior circulation: Internal carotid artery widely patent bilaterally. Anterior and middle cerebral arteries patent bilaterally without stenosis or large vessel occlusion. Posterior circulation: Both vertebral arteries patent to the basilar without stenosis. Right PICA not visualized. Very small left PICA Ca. Left AICA patent. Right AICA not visualized. Right superior cerebral artery occluded proximally. Left superior cerebellar artery patent. Posterior cerebral arteries patent bilaterally. Anatomic variants: None Other: Negative for cerebral aneurysm. IMPRESSION: Both vertebral arteries patent to the basilar. Right PICA not visualized. Small left PICA. Right superior cerebral artery occluded proximally compatible with acute infarct. Electronically Signed   By: Franchot Gallo M.D.   On: 01/20/2021 20:10   MR Angiogram Neck W or Wo Contrast   Result Date: 01/20/2021 CLINICAL DATA:  Stroke EXAM: MRA NECK WITHOUT AND WITH CONTRAST TECHNIQUE: Multiplanar and multiecho pulse sequences of the neck were obtained without and with intravenous contrast. Angiographic images of the neck were obtained using MRA technique without and with intravenous contrast. CONTRAST:  25m GADAVIST GADOBUTROL 1 MMOL/ML IV SOLN COMPARISON:  MRI head 01/20/2021 FINDINGS: Antegrade flow in the carotid and vertebral arteries  bilaterally Mild atherosclerotic disease in the carotid bulb bilaterally without flow limiting stenosis. Remainder of the internal carotid artery is widely patent through the cavernous segment. Left vertebral artery is widely patent to the basilar without stenosis Mild diffuse narrowing of the proximal right vertebral artery. Remainder of the right vertebral artery is patent to the basilar. IMPRESSION: Mild atherosclerotic disease in the carotid bowel bulb bilaterally without significant stenosis Mild narrowing proximal right vertebral artery. This may be due to atherosclerotic disease or dissection. Given the posterior fossa infarcts, consider CT angio head and neck for further evaluation. Electronically Signed   By: CFranchot GalloM.D.   On: 01/20/2021 20:07   MR BRAIN WO CONTRAST   Result Date: 01/20/2021 CLINICAL DATA:  Acute neuro deficit.  Stroke. EXAM: MRI HEAD WITHOUT CONTRAST TECHNIQUE: Multiplanar, multiecho pulse sequences of the brain and surrounding structures were obtained without intravenous contrast. COMPARISON:  CT head 01/20/2021 FINDINGS: Brain: 3 cm acute infarct in the right superior cerebellar artery territory. Additional small area of acute infarct in the right PICA territory. Small acute infarct in the left thalamus. Ventricle size is normal. Negative for hemorrhage or mass. Few small white matter hyperintensities bilaterally likely due to chronic microvascular ischemia. Vascular: Normal arterial flow voids. Skull and upper cervical spine: Negative Sinuses/Orbits: Paranasal sinuses clear. Bilateral cataract extraction Other: None IMPRESSION: 3 cm acute infarct right superior cerebellum. Smaller acute infarct right PICA territory. Small acute infarct left thalamus. Electronically Signed   By: CFranchot GalloM.D.   On: 01/20/2021 20:02   ECHOCARDIOGRAM COMPLETE   Result Date: 01/21/2021    ECHOCARDIOGRAM REPORT   Patient Name:   JJODI CRISCUOLODate of Exam: 01/21/2021 Medical Rec #:   [id number]   Height:  61.0 in Accession #:    [id number]   Weight:       143.0 lb Date of Birth:  August 03, 1956    BSA:          1.638 m Patient Age:    69 years     BP:           156/99 mmHg Patient Gender: F            HR:           66 bpm. Exam Location:  Inpatient Procedure: 2D Echo, Cardiac Doppler and Color Doppler Indications:    Stroke I63.9  History:        Patient has prior history of Echocardiogram examinations, most                 recent 08/07/2018. CAD, Carotid Disease; Risk                 Factors:Hypertension, Diabetes, Dyslipidemia, Sleep Apnea and                 GERD. Seizures.  Sonographer:    Jonelle Sidle Dance Referring Phys: [id number] EKTA V PATEL IMPRESSIONS  1. Left ventricular ejection fraction, by estimation, is 60 to 65%. The left ventricle has normal function. The left ventricle has no regional wall motion abnormalities. There is mild left ventricular hypertrophy. Left ventricular diastolic parameters are consistent with Grade I diastolic dysfunction (impaired relaxation).  2. Right ventricular systolic function is normal. The right ventricular size is normal. Tricuspid regurgitation signal is inadequate for assessing PA pressure.  3. The mitral valve is grossly normal. Trivial mitral valve regurgitation. No evidence of mitral stenosis.  4. The aortic valve is tricuspid. Aortic valve regurgitation is not visualized. No aortic stenosis is present.  5. The inferior vena cava is normal in size with greater than 50% respiratory variability, suggesting right atrial pressure of 3 mmHg. Conclusion(s)/Recommendation(s): No intracardiac source of embolism detected on this transthoracic study. A transesophageal echocardiogram is recommended to exclude cardiac source of embolism if clinically indicated. FINDINGS  Left Ventricle: Left ventricular ejection fraction, by estimation, is 60 to 65%. The left ventricle has normal function. The left ventricle has no regional wall motion abnormalities. The left  ventricular internal cavity size was normal in size. There is  mild left ventricular hypertrophy. Left ventricular diastolic parameters are consistent with Grade I diastolic dysfunction (impaired relaxation). Right Ventricle: The right ventricular size is normal. No increase in right ventricular wall thickness. Right ventricular systolic function is normal. Tricuspid regurgitation signal is inadequate for assessing PA pressure. Left Atrium: Left atrial size was normal in size. Right Atrium: Right atrial size was normal in size. Pericardium: Trivial pericardial effusion is present. Mitral Valve: The mitral valve is grossly normal. Mild mitral annular calcification. Trivial mitral valve regurgitation. No evidence of mitral valve stenosis. Tricuspid Valve: The tricuspid valve is normal in structure. Tricuspid valve regurgitation is not demonstrated. No evidence of tricuspid stenosis. Aortic Valve: The aortic valve is tricuspid. Aortic valve regurgitation is not visualized. No aortic stenosis is present. Pulmonic Valve: The pulmonic valve was normal in structure. Pulmonic valve regurgitation is trivial. No evidence of pulmonic stenosis. Aorta: The aortic root is normal in size and structure. Venous: The inferior vena cava is normal in size with greater than 50% respiratory variability, suggesting right atrial pressure of 3 mmHg. IAS/Shunts: No atrial level shunt detected by color flow Doppler.  LEFT VENTRICLE PLAX 2D LVIDd:  4.00 cm  Diastology LVIDs:         3.00 cm  LV e' medial:    5.77 cm/s LV PW:         1.20 cm  LV E/e' medial:  8.8 LV IVS:        1.10 cm  LV e' lateral:   8.16 cm/s LVOT diam:     1.80 cm  LV E/e' lateral: 6.2 LV SV:         40 LV SV Index:   25 LVOT Area:     2.54 cm  RIGHT VENTRICLE            IVC RV Basal diam:  2.30 cm    IVC diam: 1.10 cm RV S prime:     8.59 cm/s TAPSE (M-mode): 1.4 cm LEFT ATRIUM             Index       RIGHT ATRIUM           Index LA diam:        2.80 cm 1.71 cm/m   RA Area:     10.80 cm LA Vol (A2C):   42.3 ml 25.83 ml/m RA Volume:   21.80 ml  13.31 ml/m LA Vol (A4C):   37.9 ml 23.14 ml/m LA Biplane Vol: 40.7 ml 24.85 ml/m  AORTIC VALVE LVOT Vmax:   71.70 cm/s LVOT Vmean:  48.700 cm/s LVOT VTI:    0.159 m  AORTA Ao Root diam: 3.00 cm Ao Asc diam:  2.70 cm MITRAL VALVE MV Area (PHT): 2.32 cm    SHUNTS MV Decel Time: 327 msec    Systemic VTI:  0.16 m MV E velocity: 50.60 cm/s  Systemic Diam: 1.80 cm MV A velocity: 69.40 cm/s MV E/A ratio:  0.73 Cherlynn Kaiser MD Electronically signed by Cherlynn Kaiser MD Signature Date/Time: 01/21/2021/10:46:15 AM    Final     CT ANGIO HEAD CODE STROKE   Result Date: 01/20/2021 CLINICAL DATA:  Focal neuro deficit, > 6 hrs, stroke suspected EXAM: CT ANGIOGRAPHY HEAD AND NECK TECHNIQUE: Multidetector CT imaging of the head and neck was performed using the standard protocol during bolus administration of intravenous contrast. Multiplanar CT image reconstructions and MIPs were obtained to evaluate the vascular anatomy. Carotid stenosis measurements (when applicable) are obtained utilizing NASCET criteria, using the distal internal carotid diameter as the denominator. CONTRAST:  73m OMNIPAQUE IOHEXOL 350 MG/ML SOLN COMPARISON:  None. FINDINGS: CTA NECK FINDINGS SKELETON: There is no bony spinal canal stenosis. No lytic or blastic lesion. OTHER NECK: Normal pharynx, larynx and major salivary glands. No cervical lymphadenopathy. Unremarkable thyroid gland. UPPER CHEST: No pneumothorax or pleural effusion. No nodules or masses. AORTIC ARCH: There is calcific atherosclerosis of the aortic arch. There is no aneurysm, dissection or hemodynamically significant stenosis of the visualized portion of the aorta. Conventional 3 vessel aortic branching pattern. The visualized proximal subclavian arteries are widely patent. RIGHT CAROTID SYSTEM: Normal without aneurysm, dissection or stenosis. LEFT CAROTID SYSTEM: Normal without aneurysm, dissection or  stenosis. VERTEBRAL ARTERIES: Codominant configuration. Both origins are clearly patent. There is no dissection, occlusion or flow-limiting stenosis to the skull base (V1-V3 segments). CTA HEAD FINDINGS POSTERIOR CIRCULATION: --Vertebral arteries: Normal V4 segments. --Inferior cerebellar arteries: Normal. --Basilar artery: Normal. 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  Normal left. --Posterior cerebral arteries (PCA): Normal. ANTERIOR CIRCULATION: --Intracranial internal carotid arteries: Normal. --Anterior cerebral arteries (ACA): Normal. Both A1 segments are present. Patent anterior communicating artery (a-comm). --  Middle cerebral arteries (MCA): Normal. VENOUS SINUSES: As permitted by contrast timing, patent. ANATOMIC VARIANTS: None Review of the MIP images confirms the above findings. IMPRESSION: 1. No emergent large vessel occlusion. 2. Proximal occlusion of the right superior cerebellar artery. Aortic Atherosclerosis (ICD10-I70.0). Electronically Signed   By: Ulyses Jarred M.D.   On: 01/20/2021 21:54   CT ANGIO NECK CODE STROKE   Result Date: 01/20/2021 CLINICAL DATA:  Focal neuro deficit, > 6 hrs, stroke suspected EXAM: CT ANGIOGRAPHY HEAD AND NECK TECHNIQUE: Multidetector CT imaging of the head and neck was performed using the standard protocol during bolus administration of intravenous contrast. Multiplanar CT image reconstructions and MIPs were obtained to evaluate the vascular anatomy. Carotid stenosis measurements (when applicable) are obtained utilizing NASCET criteria, using the distal internal carotid diameter as the denominator. CONTRAST:  36m OMNIPAQUE IOHEXOL 350 MG/ML SOLN COMPARISON:  None. FINDINGS: CTA NECK FINDINGS SKELETON: There is no bony spinal canal stenosis. No lytic or blastic lesion. OTHER NECK: Normal pharynx, larynx and major salivary glands. No cervical lymphadenopathy. Unremarkable thyroid gland. UPPER CHEST: No pneumothorax or pleural effusion. No nodules or  masses. AORTIC ARCH: There is calcific atherosclerosis of the aortic arch. There is no aneurysm, dissection or hemodynamically significant stenosis of the visualized portion of the aorta. Conventional 3 vessel aortic branching pattern. The visualized proximal subclavian arteries are widely patent. RIGHT CAROTID SYSTEM: Normal without aneurysm, dissection or stenosis. LEFT CAROTID SYSTEM: Normal without aneurysm, dissection or stenosis. VERTEBRAL ARTERIES: Codominant configuration. Both origins are clearly patent. There is no dissection, occlusion or flow-limiting stenosis to the skull base (V1-V3 segments). CTA HEAD FINDINGS POSTERIOR CIRCULATION: --Vertebral arteries: Normal V4 segments. --Inferior cerebellar arteries: Normal. --Basilar artery: Normal. 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  Normal left. --Posterior cerebral arteries (PCA): Normal. ANTERIOR CIRCULATION: --Intracranial internal carotid arteries: Normal. --Anterior cerebral arteries (ACA): Normal. Both A1 segments are present. Patent anterior communicating artery (a-comm). --Middle cerebral arteries (MCA): Normal. VENOUS SINUSES: As permitted by contrast timing, patent. ANATOMIC VARIANTS: None Review of the MIP images confirms the above findings. IMPRESSION: 1. No emergent large vessel occlusion. 2. Proximal occlusion of the right superior cerebellar artery. Aortic Atherosclerosis (ICD10-I70.0). Electronically Signed   By: KUlyses JarredM.D.   On: 01/20/2021 21:54        Assessment/Plan: Diagnosis: R cerebellar stroke Does the need for close, 24 hr/day medical supervision in concert with the patient's rehab needs make it unreasonable for this patient to be served in a less intensive setting? Yes Co-Morbidities requiring supervision/potential complications: COPD, likely- is smoker; seizures, CAD, OSA, DM, HTN, new stroke  Due to bladder management, bowel management, safety, skin/wound care, disease management, medication  administration and patient education, does the patient require 24 hr/day rehab nursing? Yes Does the patient require coordinated care of a physician, rehab nurse, therapy disciplines of PT and OT to address physical and functional deficits in the context of the above medical diagnosis(es)? Yes Addressing deficits in the following areas: balance, endurance, locomotion, strength, transferring, bathing, dressing, feeding, grooming and toileting Can the patient actively participate in an intensive therapy program of at least 3 hrs of therapy per day at least 5 days per week? Yes The potential for patient to make measurable gains while on inpatient rehab is good Anticipated functional outcomes upon discharge from inpatient rehab are modified independent and supervision  with PT, modified independent and supervision with OT, n/a with SLP. Estimated rehab length of stay to reach the above functional goals is: 12-14  days Anticipated discharge destination: Home Overall Rehab/Functional Prognosis: good   RECOMMENDATIONS: This patient's condition is appropriate for continued rehabilitative care in the following setting: CIR Patient has agreed to participate in recommended program. Yes Note that insurance prior authorization may be required for reimbursement for recommended care.   Comment:  1. Pt is a great candidate for inpt rehab/CIR for a cerebellar stroke. 2. She also hasn't had a BM in 3 days- needs to have a BM. 3. Wears a CPAP at home- daughter will try to have it brought it. 4. Will d/w admissions coordinators for admission 5. Thank you for this consult     Bary Leriche, PA-C

## 2021-01-30 NOTE — Progress Notes (Signed)
Report given to CIR RN, ALL belongings sent with pt. Pt will be transferred to CIR rm [id number]. Family notified

## 2021-01-30 NOTE — Discharge Summary (Signed)
Physician Discharge Summary  Samantha Huffman RCV:[id number] DOB: Jul 19, 1956 DOA: 01/20/2021  PCP: Alroy Dust, L.Marlou Sa, MD  Admit date: 01/20/2021 Discharge date: 01/30/2021  Time spent: 55 minutes  Recommendations for Outpatient Follow-up:  Patient will be discharged to inpatient rehab/CIR. Follow-up with Dr. Jannifer Franklin, neurology post discharge from inpatient CIR. Follow-up with Alroy Dust, L.Marlou Sa, MD discharge from [place].   Discharge Diagnoses:  Principal Problem:   Acute cerebrovascular accident (CVA) ([place]) Active Problems:   Diabetes mellitus without complication ([place])   Dyslipidemia   Seizures ([place])   CAD (coronary artery disease)   Essential hypertension   Carotid artery stenosis, asymptomatic   Tobacco abuse   Acute CVA (cerebrovascular accident) ([place])   Discharge Condition: Stable.  Diet recommendation: Heart healthy  Filed Weights   01/24/21 0355  Weight: 65.7 kg    History of present illness:  HPI per Dr. Darryl Nestle Samantha Huffman is a 65 y.o. female seen in ed with complaints of  Unsteady gait and right arm and right leg weakness since Sunday. Pt is post BL cataract sx and was lifting water case  over weekend and since then this has started. Pt does reports headache and balance issue and no chest pain sob, or bleeding or abdominal or urinary issue. Pt states that Sunday night she realized the case of water was in the truck so she went to get it and when she put th e case down she has sharp pain on rt side of her neck and she returned to bed and then in am woke up and realized her rt arm and leg was not feeling the same and both her daughter were there to bathe her and feed her but she has been feeling tired and so came to er.  Pt has past medical history of HTN, DM II, Seizure,GERD.   ED Course:        Vitals:    01/20/21 1515 01/20/21 1530 01/20/21 1545 01/20/21 1601  BP:       (!) 196/87  Pulse: 71 69 (!) 59 68  Resp: '17 13 19 17  ' Temp:          SpO2: 98% 100% 100% 99%  In ed  pt is alert,awake and oriented and Hypertensive. No gross deficit. Labs shows normal CMP except for glucose of 164 and alk phos of 127 normal CBC with a hemoglobin of 16.5.  CT head noncontrast shows an acute to subacute right cerebellar infarct. Neurology consulted in the emergency room and patient to be admitted for acute ischemic CVA evaluation and MRI of the brain.  Hospital Course:  1 acute ischemic CVA/3 cm acute infarct right superior cerebellum/right PICA territory infarct/small acute infarct left thalamus -Patient presented with unsteady gait and right arm and right lower extremity weakness. -CT head with acute to subacute right cerebellar infarct noted. -CT angio head and neck with no emergent large vessel occlusion.  Proximal occlusion of right superior cerebellar artery. -MRI head done with 3 cm acute infarct right superior cerebellum.  Small acute infarct right PICA territory.  Small acute infarct left thalamus. -MRA with acute to subacute right cerebellar infarct. -Repeat head CT done 01/23/2021 with stable infarct and no hydrocephalus. -2D echo done with EF 60 to 65%, mild LVH, grade 1 diastolic dysfunction, no source of emboli noted. -Fasting lipid panel with LDL of 57. -Patient noted to be on aspirin 81 mg and Plavix prior to admission. -Patient seen in consultation by neurology who are recommending aspirin 325 mg and Plavix 75 mg  dual antiplatelet therapy x3 months, then back to home regimen of aspirin 81 mg and Plavix 75 mg daily. -Patient also maintained on Lipitor. -Patient seen by PT/OT who recommended CIR. -Per CIR insurance initially denied admission to CIR and subsequently denial overturned after an appeal process, and patient will be discharged to CIR/inpatient rehab.    2.  Seizure disorder -No seizures noted. -Patient maintained on Keppra.     3.  Hypertension -Blood pressure controlled on Imdur, Coreg, lisinopril.   -Outpatient follow-up.   4.  Type 2 diabetes  mellitus -Hemoglobin A1c 7.1 (01/21/2021) -Blood glucose levels well controlled with sliding scale insulin.   -Outpatient follow-up.     5.  Hyperlipidemia -Patient maintained on Zetia, statin.    6.  Coronary artery disease -Stable. -Patient maintained on Imdur, Coreg, lisinopril. -Outpatient follow-up.   7.  Carotid artery stenosis -Noted on imaging for mild atherosclerotic disease in the carotid bulb bilaterally without significant stenosis. -Risk factor modification.   8.  Tobacco abuse -Tobacco cessation stressed to patient.    Procedures: CT head 01/20/2021, 01/23/2021, 01/26/2021 MRI brain 01/20/2021 MRA head and neck 01/20/2021 2D echo 01/21/2021 CT angio head and neck 01/20/2021  Consultations: Neurology: Dr. Hortense Ramal 01/20/2021 Inpatient rehab: Dr. Dagoberto Ligas 01/22/2021  Discharge Exam: Vitals:   01/30/21 0535 01/30/21 0742  BP: (!) 149/74 (!) 140/92  Pulse: 66 74  Resp: 18 16  Temp: 98.4 F (36.9 C) 97.9 F (36.6 C)  SpO2: 97% 99%    General: NAD Cardiovascular: RRR no murmurs rubs or gallops.  No JVD.  No lower extremity edema. Respiratory: Lungs clear to auscultation bilaterally.  No wheezes, no crackles, no rhonchi.  Normal respiratory effort.  Discharge Instructions   Discharge Instructions     Ambulatory referral to Neurology   Complete by: As directed    Follow up with Dr. Jannifer Franklin at [facility] in 4 weeks. Pt is Dr. Jannifer Franklin' pt. Thanks.        Allergies  Allergen Reactions   Depakote [Divalproex Sodium] Other (See Comments)    Hyperammonemia   Other Other (See Comments)   Penicillins Hives, Other (See Comments) and Rash    Other reaction(s): GI intolerance Has patient had a PCN reaction causing immediate rash, facial/tongue/throat swelling, SOB or lightheadedness with hypotension: YES Has patient had a PCN reaction causing severe rash involving mucus membranes or skin necrosis: NO Has patient had a PCN reaction that required hospitalizationNO Has patient had a  PCN reaction occurring within the last 10 years: NO If all of the above answers are "NO", then may proceed with Cephalosporin use. Other reaction(s): GI Upset (intolerance) Other reaction(s): GI intolerance Has patient had a PCN reaction causing immediate rash, facial/tongue/throat swelling, SOB or lightheadedness with hypotension: YES Has patient had a PCN reaction causing severe rash involving mucus membranes or skin necrosis: NO Has patient had a PCN reaction that required hospitalizationNO Has patient had a PCN reaction occurring within the last 10 years: NO If all of the above answers are "NO", then may proceed with Cephalosporin use.    Follow-up Information     Kathrynn Ducking, MD. Schedule an appointment as soon as possible for a visit in 4 week(s).   Specialty: Neurology Contact information: [address] [place] [address] [phone]         Alroy Dust, L.Marlou Sa, MD. Schedule an appointment as soon as possible for a visit in 2 week(s).   Specialty: Family Medicine Contact information: [address] [place] [address] [phone]         Sueanne Margarita, MD .   Specialty: Cardiology Contact information: [phone] [address] [place] [address] [phone]                  The results of significant diagnostics from this hospitalization (including imaging, microbiology, ancillary and laboratory) are listed below for reference.    Significant Diagnostic Studies: CT HEAD WO CONTRAST  Result Date: 01/26/2021 CLINICAL DATA:  Neurological deficit, like symptoms EXAM: CT HEAD WITHOUT CONTRAST TECHNIQUE: Contiguous axial images were obtained from the base of the skull through the vertex without intravenous contrast. Sagittal and coronal MPR images reconstructed from axial data set. COMPARISON:  01/23/2021 FINDINGS: Brain: Normal ventricular morphology. No midline shift or mass effect. Superior RIGHT cerebellar infarct  again identified. LEFT thalamic lacunar infarct seen on prior exam is less well demonstrated on current study. Remaining brain parenchyma normal appearance. No intracranial hemorrhage, mass lesion or additional infarction. No extra-axial fluid collections. Vascular: Atherosclerotic calcifications of internal carotid arteries at skull base Skull: Intact Sinuses/Orbits: Clear Other: N/A IMPRESSION: Again identified RIGHT cerebellar and LEFT thalamic infarcts. No new intracranial abnormalities. Electronically Signed   By: Lavonia Dana M.D.   On: 01/26/2021 15:13   CT HEAD WO CONTRAST  Result Date: 01/23/2021 CLINICAL DATA:  65 year old female with neurologic deficit, right superior cerebellar artery occlusion and infarct. EXAM: CT HEAD WITHOUT CONTRAST TECHNIQUE: Contiguous axial images were obtained from the base of the skull through the vertex without intravenous contrast. COMPARISON:  CTA head and neck, MRI and MRA 01/20/2021 and earlier. FINDINGS: Brain: Confluent cytotoxic edema in the right cerebellum SCA territory has not significantly changed from the CT on 01/20/2021. Superimposed small linear right PICA territory infarcts also stable. No significant posterior fossa mass effect. Recent left thalamic lacunar type infarct appears stable. No acute intracranial hemorrhage identified. No intracranial mass effect. No ventriculomegaly. Stable gray-white matter differentiation elsewhere. Vascular: Calcified atherosclerosis at the skull base. Skull: No acute osseous abnormality identified. Sinuses/Orbits: Visualized paranasal sinuses and mastoids are stable and well aerated. Other: No acute orbit or scalp soft tissue finding. IMPRESSION: 1. Stable appearance of the the recent Right SCA, small Right PICA, and left thalamic lacunar infarcts. No associated hemorrhage or mass effect. 2. No new intracranial abnormality. Electronically Signed   By: Genevie Ann M.D.   On: 01/23/2021 06:46   CT HEAD WO CONTRAST  Result Date:  01/20/2021 CLINICAL DATA:  Unsteady gait and leg weakness. EXAM: CT HEAD WITHOUT CONTRAST TECHNIQUE: Contiguous axial images were obtained from the base of the skull through the vertex without intravenous contrast. COMPARISON:  None. FINDINGS: Brain: There is a 4 cm infarct in the superior aspect of the right cerebellar hemisphere consistent with an acute to subacute appearance. Cytotoxic edema results in mild local mass effect. There is also likely a separate subcentimeter subacute infarct more posteriorly in the right cerebellar hemisphere. No intracranial hemorrhage, midline shift, or extra-axial fluid collection is identified. The ventricles are normal in size. Vascular: Calcified atherosclerosis at the skull base. Skull: No fracture or suspicious osseous lesion. Sinuses/Orbits: Minimal bilateral ethmoid air cell mucosal thickening. Clear mastoid air cells. Bilateral cataract extraction. Other: None. 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 Electronically Signed   By: Logan Bores M.D.   On: 01/20/2021 13:43   MR ANGIO HEAD WO CONTRAST  Result Date: 01/20/2021 CLINICAL DATA:  Stroke EXAM: MRA HEAD WITHOUT CONTRAST TECHNIQUE: Angiographic images  of the Circle of Willis were acquired using MRA technique without intravenous contrast. COMPARISON:  MRI head 01/20/2021 FINDINGS: Anterior circulation: Internal carotid artery widely patent bilaterally. Anterior and middle cerebral arteries patent bilaterally without stenosis or large vessel occlusion. Posterior circulation: Both vertebral arteries patent to the basilar without stenosis. Right PICA not visualized. Very small left PICA Ca. Left AICA patent. Right AICA not visualized. Right superior cerebral artery occluded proximally. Left superior cerebellar artery patent. Posterior cerebral arteries patent bilaterally. Anatomic variants: None Other: Negative for cerebral aneurysm. IMPRESSION: Both vertebral arteries patent to the basilar. Right PICA  not visualized. Small left PICA. Right superior cerebral artery occluded proximally compatible with acute infarct. Electronically Signed   By: Franchot Gallo M.D.   On: 01/20/2021 20:10   MR Angiogram Neck W or Wo Contrast  Result Date: 01/20/2021 CLINICAL DATA:  Stroke EXAM: MRA NECK WITHOUT AND WITH CONTRAST TECHNIQUE: Multiplanar and multiecho pulse sequences of the neck were obtained without and with intravenous contrast. Angiographic images of the neck were obtained using MRA technique without and with intravenous contrast. CONTRAST:  59m GADAVIST GADOBUTROL 1 MMOL/ML IV SOLN COMPARISON:  MRI head 01/20/2021 FINDINGS: Antegrade flow in the carotid and vertebral arteries bilaterally Mild atherosclerotic disease in the carotid bulb bilaterally without flow limiting stenosis. Remainder of the internal carotid artery is widely patent through the cavernous segment. Left vertebral artery is widely patent to the basilar without stenosis Mild diffuse narrowing of the proximal right vertebral artery. Remainder of the right vertebral artery is patent to the basilar. IMPRESSION: Mild atherosclerotic disease in the carotid bowel bulb bilaterally without significant stenosis Mild narrowing proximal right vertebral artery. This may be due to atherosclerotic disease or dissection. Given the posterior fossa infarcts, consider CT angio head and neck for further evaluation. Electronically Signed   By: CFranchot GalloM.D.   On: 01/20/2021 20:07   MR BRAIN WO CONTRAST  Result Date: 01/20/2021 CLINICAL DATA:  Acute neuro deficit.  Stroke. EXAM: MRI HEAD WITHOUT CONTRAST TECHNIQUE: Multiplanar, multiecho pulse sequences of the brain and surrounding structures were obtained without intravenous contrast. COMPARISON:  CT head 01/20/2021 FINDINGS: Brain: 3 cm acute infarct in the right superior cerebellar artery territory. Additional small area of acute infarct in the right PICA territory. Small acute infarct in the left  thalamus. Ventricle size is normal. Negative for hemorrhage or mass. Few small white matter hyperintensities bilaterally likely due to chronic microvascular ischemia. Vascular: Normal arterial flow voids. Skull and upper cervical spine: Negative Sinuses/Orbits: Paranasal sinuses clear. Bilateral cataract extraction Other: None IMPRESSION: 3 cm acute infarct right superior cerebellum. Smaller acute infarct right PICA territory. Small acute infarct left thalamus. Electronically Signed   By: CFranchot GalloM.D.   On: 01/20/2021 20:02   ECHOCARDIOGRAM COMPLETE  Result Date: 01/21/2021    ECHOCARDIOGRAM REPORT   Patient Name:   JKESHARA KIGERDate of Exam: 01/21/2021 Medical Rec #:  [id number]   Height:       61.0 in Accession #:    [id number]  Weight:       143.0 lb Date of Birth:  8Aug 02, 1957   BSA:          1.638 m Patient Age:    89years     BP:           156/99 mmHg Patient Gender: F            HR:  66 bpm. Exam Location:  Inpatient Procedure: 2D Echo, Cardiac Doppler and Color Doppler Indications:    Stroke I63.9  History:        Patient has prior history of Echocardiogram examinations, most                 recent 08/07/2018. CAD, Carotid Disease; Risk                 Factors:Hypertension, Diabetes, Dyslipidemia, Sleep Apnea and                 GERD. Seizures.  Sonographer:    Jonelle Sidle Dance Referring Phys: [id number] EKTA V PATEL IMPRESSIONS  1. Left ventricular ejection fraction, by estimation, is 60 to 65%. The left ventricle has normal function. The left ventricle has no regional wall motion abnormalities. There is mild left ventricular hypertrophy. Left ventricular diastolic parameters are consistent with Grade I diastolic dysfunction (impaired relaxation).  2. Right ventricular systolic function is normal. The right ventricular size is normal. Tricuspid regurgitation signal is inadequate for assessing PA pressure.  3. The mitral valve is grossly normal. Trivial mitral valve regurgitation. No evidence of  mitral stenosis.  4. The aortic valve is tricuspid. Aortic valve regurgitation is not visualized. No aortic stenosis is present.  5. The inferior vena cava is normal in size with greater than 50% respiratory variability, suggesting right atrial pressure of 3 mmHg. Conclusion(s)/Recommendation(s): No intracardiac source of embolism detected on this transthoracic study. A transesophageal echocardiogram is recommended to exclude cardiac source of embolism if clinically indicated. FINDINGS  Left Ventricle: Left ventricular ejection fraction, by estimation, is 60 to 65%. The left ventricle has normal function. The left ventricle has no regional wall motion abnormalities. The left ventricular internal cavity size was normal in size. There is  mild left ventricular hypertrophy. Left ventricular diastolic parameters are consistent with Grade I diastolic dysfunction (impaired relaxation). Right Ventricle: The right ventricular size is normal. No increase in right ventricular wall thickness. Right ventricular systolic function is normal. Tricuspid regurgitation signal is inadequate for assessing PA pressure. Left Atrium: Left atrial size was normal in size. Right Atrium: Right atrial size was normal in size. Pericardium: Trivial pericardial effusion is present. Mitral Valve: The mitral valve is grossly normal. Mild mitral annular calcification. Trivial mitral valve regurgitation. No evidence of mitral valve stenosis. Tricuspid Valve: The tricuspid valve is normal in structure. Tricuspid valve regurgitation is not demonstrated. No evidence of tricuspid stenosis. Aortic Valve: The aortic valve is tricuspid. Aortic valve regurgitation is not visualized. No aortic stenosis is present. Pulmonic Valve: The pulmonic valve was normal in structure. Pulmonic valve regurgitation is trivial. No evidence of pulmonic stenosis. Aorta: The aortic root is normal in size and structure. Venous: The inferior vena cava is normal in size with  greater than 50% respiratory variability, suggesting right atrial pressure of 3 mmHg. IAS/Shunts: No atrial level shunt detected by color flow Doppler.  LEFT VENTRICLE PLAX 2D LVIDd:         4.00 cm  Diastology LVIDs:         3.00 cm  LV e' medial:    5.77 cm/s LV PW:         1.20 cm  LV E/e' medial:  8.8 LV IVS:        1.10 cm  LV e' lateral:   8.16 cm/s LVOT diam:     1.80 cm  LV E/e' lateral: 6.2 LV SV:         40  LV SV Index:   25 LVOT Area:     2.54 cm  RIGHT VENTRICLE            IVC RV Basal diam:  2.30 cm    IVC diam: 1.10 cm RV S prime:     8.59 cm/s TAPSE (M-mode): 1.4 cm LEFT ATRIUM             Index       RIGHT ATRIUM           Index LA diam:        2.80 cm 1.71 cm/m  RA Area:     10.80 cm LA Vol (A2C):   42.3 ml 25.83 ml/m RA Volume:   21.80 ml  13.31 ml/m LA Vol (A4C):   37.9 ml 23.14 ml/m LA Biplane Vol: 40.7 ml 24.85 ml/m  AORTIC VALVE LVOT Vmax:   71.70 cm/s LVOT Vmean:  48.700 cm/s LVOT VTI:    0.159 m  AORTA Ao Root diam: 3.00 cm Ao Asc diam:  2.70 cm MITRAL VALVE MV Area (PHT): 2.32 cm    SHUNTS MV Decel Time: 327 msec    Systemic VTI:  0.16 m MV E velocity: 50.60 cm/s  Systemic Diam: 1.80 cm MV A velocity: 69.40 cm/s MV E/A ratio:  0.73 Cherlynn Kaiser MD Electronically signed by Cherlynn Kaiser MD Signature Date/Time: 01/21/2021/10:46:15 AM    Final    CT ANGIO HEAD CODE STROKE  Result Date: 01/20/2021 CLINICAL DATA:  Focal neuro deficit, > 6 hrs, stroke suspected EXAM: CT ANGIOGRAPHY HEAD AND NECK TECHNIQUE: Multidetector CT imaging of the head and neck was performed using the standard protocol during bolus administration of intravenous contrast. Multiplanar CT image reconstructions and MIPs were obtained to evaluate the vascular anatomy. Carotid stenosis measurements (when applicable) are obtained utilizing NASCET criteria, using the distal internal carotid diameter as the denominator. CONTRAST:  100m OMNIPAQUE IOHEXOL 350 MG/ML SOLN COMPARISON:  None. FINDINGS: CTA NECK FINDINGS  SKELETON: There is no bony spinal canal stenosis. No lytic or blastic lesion. OTHER NECK: Normal pharynx, larynx and major salivary glands. No cervical lymphadenopathy. Unremarkable thyroid gland. UPPER CHEST: No pneumothorax or pleural effusion. No nodules or masses. AORTIC ARCH: There is calcific atherosclerosis of the aortic arch. There is no aneurysm, dissection or hemodynamically significant stenosis of the visualized portion of the aorta. Conventional 3 vessel aortic branching pattern. The visualized proximal subclavian arteries are widely patent. RIGHT CAROTID SYSTEM: Normal without aneurysm, dissection or stenosis. LEFT CAROTID SYSTEM: Normal without aneurysm, dissection or stenosis. VERTEBRAL ARTERIES: Codominant configuration. Both origins are clearly patent. There is no dissection, occlusion or flow-limiting stenosis to the skull base (V1-V3 segments). CTA HEAD FINDINGS POSTERIOR CIRCULATION: --Vertebral arteries: Normal V4 segments. --Inferior cerebellar arteries: Normal. --Basilar artery: Normal. 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  Normal left. --Posterior cerebral arteries (PCA): Normal. ANTERIOR CIRCULATION: --Intracranial internal carotid arteries: Normal. --Anterior cerebral arteries (ACA): Normal. Both A1 segments are present. Patent anterior communicating artery (a-comm). --Middle cerebral arteries (MCA): Normal. VENOUS SINUSES: As permitted by contrast timing, patent. ANATOMIC VARIANTS: None Review of the MIP images confirms the above findings. IMPRESSION: 1. No emergent large vessel occlusion. 2. Proximal occlusion of the right superior cerebellar artery. Aortic Atherosclerosis (ICD10-I70.0). Electronically Signed   By: KUlyses JarredM.D.   On: 01/20/2021 21:54   CT ANGIO NECK CODE STROKE  Result Date: 01/20/2021 CLINICAL DATA:  Focal neuro deficit, > 6 hrs, stroke suspected EXAM: CT ANGIOGRAPHY HEAD AND NECK TECHNIQUE: Multidetector  CT imaging of the head and neck was  performed using the standard protocol during bolus administration of intravenous contrast. Multiplanar CT image reconstructions and MIPs were obtained to evaluate the vascular anatomy. Carotid stenosis measurements (when applicable) are obtained utilizing NASCET criteria, using the distal internal carotid diameter as the denominator. CONTRAST:  69m OMNIPAQUE IOHEXOL 350 MG/ML SOLN COMPARISON:  None. FINDINGS: CTA NECK FINDINGS SKELETON: There is no bony spinal canal stenosis. No lytic or blastic lesion. OTHER NECK: Normal pharynx, larynx and major salivary glands. No cervical lymphadenopathy. Unremarkable thyroid gland. UPPER CHEST: No pneumothorax or pleural effusion. No nodules or masses. AORTIC ARCH: There is calcific atherosclerosis of the aortic arch. There is no aneurysm, dissection or hemodynamically significant stenosis of the visualized portion of the aorta. Conventional 3 vessel aortic branching pattern. The visualized proximal subclavian arteries are widely patent. RIGHT CAROTID SYSTEM: Normal without aneurysm, dissection or stenosis. LEFT CAROTID SYSTEM: Normal without aneurysm, dissection or stenosis. VERTEBRAL ARTERIES: Codominant configuration. Both origins are clearly patent. There is no dissection, occlusion or flow-limiting stenosis to the skull base (V1-V3 segments). CTA HEAD FINDINGS POSTERIOR CIRCULATION: --Vertebral arteries: Normal V4 segments. --Inferior cerebellar arteries: Normal. --Basilar artery: Normal. 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  Normal left. --Posterior cerebral arteries (PCA): Normal. ANTERIOR CIRCULATION: --Intracranial internal carotid arteries: Normal. --Anterior cerebral arteries (ACA): Normal. Both A1 segments are present. Patent anterior communicating artery (a-comm). --Middle cerebral arteries (MCA): Normal. VENOUS SINUSES: As permitted by contrast timing, patent. ANATOMIC VARIANTS: None Review of the MIP images confirms the above findings.  IMPRESSION: 1. No emergent large vessel occlusion. 2. Proximal occlusion of the right superior cerebellar artery. Aortic Atherosclerosis (ICD10-I70.0). Electronically Signed   By: KUlyses JarredM.D.   On: 01/20/2021 21:54    Microbiology: Recent Results (from the past 240 hour(s))  Resp Panel by RT-PCR (Flu A&B, Covid) Nasopharyngeal Swab     Status: None   Collection Time: 01/20/21  3:52 PM   Specimen: Nasopharyngeal Swab; Nasopharyngeal(NP) swabs in vial transport medium  Result Value Ref Range Status   SARS Coronavirus 2 by RT PCR NEGATIVE NEGATIVE Final    Comment: (NOTE) SARS-CoV-2 target nucleic acids are NOT DETECTED.  The SARS-CoV-2 RNA is generally detectable in upper respiratory specimens during the acute phase of infection. The lowest concentration of SARS-CoV-2 viral copies this assay can detect is 138 copies/mL. A negative result does not preclude SARS-Cov-2 infection and should not be used as the sole basis for treatment or other patient management decisions. A negative result may occur with  improper specimen collection/handling, submission of specimen other than nasopharyngeal swab, presence of viral mutation(s) within the areas targeted by this assay, and inadequate number of viral copies(<138 copies/mL). A negative result must be combined with clinical observations, patient history, and epidemiological information. The expected result is Negative.  Fact Sheet for Patients:  hEntrepreneurPulse.com.au Fact Sheet for Healthcare Providers:  hIncredibleEmployment.be This test is no t yet approved or cleared by the [place] and  has been authorized for detection and/or diagnosis of SARS-CoV-2 by FDA under an Emergency Use Authorization (EUA). This EUA will remain  in effect (meaning this test can be used) for the duration of the COVID-19 declaration under Section 564(b)(1) of the Act, 21 U.S.C.section 360bbb-3(b)(1), unless  the authorization is terminated  or revoked sooner.       Influenza A by PCR NEGATIVE NEGATIVE Final   Influenza B by PCR NEGATIVE NEGATIVE Final    Comment: (NOTE) The  Xpert Xpress SARS-CoV-2/FLU/RSV plus assay is intended as an aid in the diagnosis of influenza from Nasopharyngeal swab specimens and should not be used as a sole basis for treatment. Nasal washings and aspirates are unacceptable for Xpert Xpress SARS-CoV-2/FLU/RSV testing.  Fact Sheet for Patients: EntrepreneurPulse.com.au  Fact Sheet for Healthcare Providers: IncredibleEmployment.be  This test is not yet approved or cleared by the [place] FDA and has been authorized for detection and/or diagnosis of SARS-CoV-2 by FDA under an Emergency Use Authorization (EUA). This EUA will remain in effect (meaning this test can be used) for the duration of the COVID-19 declaration under Section 564(b)(1) of the Act, 21 U.S.C. section 360bbb-3(b)(1), unless the authorization is terminated or revoked.  Performed at [facility], [place] [address], [place], [place] [address]      Labs: Basic Metabolic Panel: Recent Labs  Lab 01/24/21 0054 01/25/21 0304 01/29/21 0359  NA 138 142 137  K 3.9 4.1 4.4  CL 110 112* 109  CO2 21* 23 19*  GLUCOSE 127* 173* 125*  BUN '23 18 21  ' CREATININE 1.09* 1.10* 1.08*  CALCIUM 9.1 9.3 9.2  MG 1.9 1.8  --   PHOS 4.5 4.7*  --    Liver Function Tests: Recent Labs  Lab 01/24/21 0054 01/25/21 0304  AST 20 21  ALT 18 20  ALKPHOS 101 102  BILITOT 0.3 0.5  PROT 6.1* 6.3*  ALBUMIN 3.4* 3.5   No results for input(s): LIPASE, AMYLASE in the last 168 hours. No results for input(s): AMMONIA in the last 168 hours. CBC: Recent Labs  Lab 01/24/21 0054 01/25/21 0304  WBC 7.6 7.8  NEUTROABS 3.9 3.6  HGB 13.5 13.0  HCT 39.8 39.0  MCV 95.4 96.3  PLT 157 163   Cardiac Enzymes: No results for input(s): CKTOTAL, CKMB, CKMBINDEX, TROPONINI in  the last 168 hours. BNP: BNP (last 3 results) No results for input(s): BNP in the last 8760 hours.  ProBNP (last 3 results) No results for input(s): PROBNP in the last 8760 hours.  CBG: Recent Labs  Lab 01/29/21 1557 01/29/21 1724 01/29/21 2148 01/30/21 0607 01/30/21 1101  GLUCAP 108* 150* 141* 139* 135*       Signed:  Irine Seal MD.  [facility] 01/30/2021, 11:26 AM

## 2021-01-30 NOTE — H&P (Signed)
Physical Medicine and Rehabilitation Admission H&P    CC: Stroke with functional deficits   HPI: Samantha Huffman is a 65 year old RH-female with history of CAD, HTN, T2DM, OSA, renal calculi, seizure d/o; who was admitted on 01/20/21 with onset of weakness right side with unsteady gait for 2-3 days. She reported having lifted a case of water the day before and started feeling dizzy. She ws found to have occlusion of R-SCA and acute/subacute right cerebellar stroke with small infarcts in right PICA and left thalamus. 2 D echo showed EF 60-65% with trivial MVR. DR. Xu questioned vertebral dissection as cause of stroke and repeat CT head in 2 days to rule out hydrocephalus. ASA increased to 325 mg with recommendations of DAPT X 3 months followed by lower dose ASA/Plavix as per home regimen.  Follow up CT head 06/3 and 06/05 showed stable right cerebellar and thalamic infarcts with out hemorrhage or mass effect. She continues to have deficits due to right sided weakness with ataxia & sensory deficits, dizziness as well as visual deficits affecting ADLs and mobility. CIR recommended due to functional decline.      Review of Systems  Constitutional:  Negative for chills and fever.  HENT:  Negative for hearing loss.   Eyes:  Positive for blurred vision.  Respiratory:  Negative for cough and shortness of breath.   Cardiovascular:  Negative for chest pain and palpitations.  Gastrointestinal:  Negative for constipation, heartburn and nausea.  Genitourinary:  Negative for dysuria and urgency.  Musculoskeletal:  Negative for myalgias and neck pain.  Neurological:  Positive for dizziness, sensory change and weakness. Negative for headaches.  Psychiatric/Behavioral:  The patient is not nervous/anxious.     Past Medical History:  Diagnosis Date   Carotid artery stenosis    1-39% bilateral stenosis by dopplers 08/2020   Coronary artery disease    a. s/p CABG 10/2012.  cath 05/2016 with moderate LM  stenosis, mild LCx and RCA stenosis and occluded LAD with patent LIMA to LAD, free radial to OM.  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   Dyslipidemia    Family history of adverse reaction to anesthesia    mother and sister ponv   Generalized convulsive epilepsy without mention of intractable epilepsy    GERD (gastroesophageal reflux disease)    History of kidney stones    Hypertension    Memory deficit    slight   Migraine    "controlled on daily RX " (06/11/2016)   Nephrolithiasis 07/30/2015   S/p lithotripsy x 3 and right and left ureter stents   OSA (obstructive sleep apnea)    cpap    PONV (postoperative nausea and vomiting)    Seizures ([place])    "controlled w/daily RX; started in 2012; dr thinks they might be from the migraines; last sz was early part of 2016" (06/11/2016)   Type II diabetes mellitus ([place])    metformin   Vasovagal syncope 07/30/2015   2011    Past Surgical History:  Procedure Laterality Date   CARDIAC CATHETERIZATION  2014; 06/11/2016   CARDIAC CATHETERIZATION N/A 06/11/2016   Procedure: Left Heart Cath and Cors/Grafts Angiography;  Surgeon: Sherren Mocha, MD;  Location: [facility];  Service: Cardiovascular;  Laterality: N/A;   CESAREAN SECTION     CORONARY ANGIOPLASTY     CORONARY ARTERY BYPASS GRAFT  March, 2014   LIMA to LAD, left radial to LCx x 2   CYSTOSCOPY W/ URETERAL STENT  PLACEMENT Left 01/16/2019   Procedure: CYSTOSCOPY WITH RETROGRADE PYELOGRAM/URETERAL LEFT STENT PLACEMENT;  Surgeon: Ardis Hughs, MD;  Location: [facility];  Service: Urology;  Laterality: Left;   CYSTOSCOPY/URETEROSCOPY/HOLMIUM LASER/STENT PLACEMENT Left 01/24/2019   Procedure: CYSTOSCOPY, LEFT URETEROSCOPY, HOLMIUM LASER, STONE REMOVAL AND STENT EXCHANGE;  Surgeon: Ardis Hughs, MD;  Location: [facility];  Service: Urology;  Laterality: Left;   LITHOTRIPSY  X 3   RIGHT/LEFT HEART CATH AND CORONARY/GRAFT ANGIOGRAPHY N/A 09/08/2018   Procedure: RIGHT/LEFT HEART  CATH AND CORONARY/GRAFT ANGIOGRAPHY;  Surgeon: Martinique, Peter M, MD;  Location: [facility];  Service: Cardiovascular;  Laterality: N/A;   TOTAL ABDOMINAL HYSTERECTOMY     ovaries took before hysterectomy   TUBAL LIGATION     URETERAL STENT PLACEMENT      Family History  Problem Relation Age of Onset   Cancer Father    Diabetes Father    Diabetes Brother    Stroke Brother    Diabetes Brother    Heart disease Brother        CAD with CABG   Heart disease Sister        CAD with MI   Heart attack Sister     Social History:  Moved to [place] in 2016 and lives with daughter. 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 She \ reports that she has been smoking I.5 PPD cigarettes. She has been smoking since she was 65 years old.  She has  never used smokeless tobacco.  She reports current alcohol use--occasional beer or wine occassionally.  She reports that she does not use drugs.    Allergies  Allergen Reactions   Depakote [Divalproex Sodium] Other (See Comments)    Hyperammonemia   Other Other (See Comments)   Penicillins Hives, Other (See Comments) and Rash    Other reaction(s): GI intolerance Has patient had a PCN reaction causing immediate rash, facial/tongue/throat swelling, SOB or lightheadedness with hypotension: YES Has patient had a PCN reaction causing severe rash involving mucus membranes or skin necrosis: NO Has patient had a PCN reaction that required hospitalizationNO Has patient had a PCN reaction occurring within the last 10 years: NO If all of the above answers are "NO", then may proceed with Cephalosporin use. Other reaction(s): GI Upset (intolerance) Other reaction(s): GI intolerance Has patient had a PCN reaction causing immediate rash, facial/tongue/throat swelling, SOB or lightheadedness with hypotension: YES Has patient had a PCN reaction causing severe rash involving mucus membranes or skin necrosis: NO Has patient had a PCN reaction that  required hospitalizationNO Has patient had a PCN reaction occurring within the last 10 years: NO If all of the above answers are "NO", then may proceed with Cephalosporin use.    Medications Prior to Admission  Medication Sig Dispense Refill   ketorolac (ACULAR) 0.5 % ophthalmic solution Place 1 drop into both eyes 3 (three) times daily. Starting 2 days prior to surgery and 3 weeks following surgery.     ofloxacin (OCUFLOX) 0.3 % ophthalmic solution Place 1 drop into both eyes 3 (three) times daily. Starting 2 days prior to surgery and 3 weeks following surgery.     prednisoLONE acetate (PRED FORTE) 1 % ophthalmic suspension Place 1 drop into both eyes 3 (three) times daily. Starting 2 days prior to surgery and 3 weeks following surgery.     acetaminophen (TYLENOL) 325 MG tablet Take 2 tablets (650 mg total) by mouth every 4 (four) hours as needed for headache or mild  pain. (Patient not taking: Reported on 01/20/2021)     Alirocumab (PRALUENT) 75 MG/ML SOAJ INJECT CONTENTS OF 1 PEN INTO THE SKIN EVERY 14 DAYS (Patient not taking: Reported on 01/20/2021) 2 mL 11   ALPRAZolam (XANAX) 0.25 MG tablet Take 0.25 mg by mouth 3 (three) times daily as needed for anxiety.  (Patient not taking: Reported on 01/20/2021)  0   aspirin EC 81 MG tablet Take 81 mg by mouth daily. (Patient not taking: Reported on 01/20/2021)     atorvastatin (LIPITOR) 80 MG tablet Take 1 tablet (80 mg total) by mouth daily. (Patient not taking: Reported on 01/20/2021) 90 tablet 2   carvedilol (COREG) 12.5 MG tablet Take 1 tablet (12.5 mg total) by mouth 2 (two) times daily. (Patient not taking: Reported on 01/20/2021) 180 tablet 2   clopidogrel (PLAVIX) 75 MG tablet Take 1 tablet (75 mg total) by mouth daily. (Patient not taking: Reported on 01/20/2021) 90 tablet 2   escitalopram (LEXAPRO) 10 MG tablet Take 10 mg by mouth at bedtime.  (Patient not taking: Reported on 01/20/2021)     ezetimibe (ZETIA) 10 MG tablet Take 1 tablet (10 mg total)  by mouth at bedtime. (Patient not taking: Reported on 01/20/2021) 90 tablet 3   furosemide (LASIX) 20 MG tablet Take 20 mg by mouth daily. (Patient not taking: Reported on 01/20/2021)     isosorbide mononitrate (IMDUR) 120 MG 24 hr tablet Take 1 tablet (120 mg total) by mouth daily. (Patient not taking: Reported on 01/20/2021) 90 tablet 2   levETIRAcetam (KEPPRA) 500 MG tablet Take 1 tablet (500 mg total) by mouth 2 (two) times daily. (Patient not taking: Reported on 01/20/2021) 180 tablet 3   lisinopril (ZESTRIL) 20 MG tablet Take 1 tablet (20 mg total) by mouth daily. (Patient not taking: Reported on 01/20/2021) 90 tablet 2   metFORMIN (GLUCOPHAGE-XR) 500 MG 24 hr tablet Take 500 mg by mouth daily with breakfast.  (Patient not taking: Reported on 01/20/2021)  12   Nicotine 21-14-7 MG/24HR KIT Use as directed (Patient not taking: Reported on 01/20/2021) 1 kit 0   nitroGLYCERIN (NITROSTAT) 0.4 MG SL tablet Place 1 tablet (0.4 mg total) under the tongue every 5 (five) minutes x 3 doses as needed for chest pain. (Patient not taking: Reported on 01/20/2021) 25 tablet 4   pantoprazole (PROTONIX) 40 MG tablet TAKE 1 TABLET BY MOUTH EVERY DAY (Patient not taking: Reported on 01/20/2021) 90 tablet 2   Potassium Citrate 15 MEQ (1620 MG) TBCR Take 1 tablet by mouth in the morning and at bedtime. (Patient not taking: Reported on 01/20/2021)     topiramate (TOPAMAX) 25 MG tablet Take 3 tablets (75 mg total) by mouth 2 (two) times daily. (Patient not taking: Reported on 01/20/2021) 540 tablet 4    Drug Regimen Review  Drug regimen was reviewed and remains appropriate with no significant issues identified  Home: Home Living Family/patient expects to be discharged to:: Private residence Living Arrangements: Alone Available Help at Discharge: Family, Available 24 hours/day Type of Home: Apartment Home Access: Level entry Home Layout: One level Bathroom Shower/Tub: Government social research officer  Accessibility: Yes Home Equipment: None Additional Comments: lived alone in apartment  Lives With: Alone   Functional History: Prior Function Level of Independence: Independent  Functional Status:  Mobility: Bed Mobility Overal bed mobility: Needs Assistance Bed Mobility: Supine to Sit Supine to sit: Supervision Sit to supine: Min guard, HOB elevated General bed mobility comments: Supervision for safety  with use of bed rails. Transfers Overall transfer level: Needs assistance Equipment used: Rolling walker (2 wheeled) Transfers: Sit to/from Stand Sit to Stand: Min guard Stand pivot transfers: Min assist General transfer comment: Min guard for safety. Required cues for safe hand placement. Ambulation/Gait Ambulation/Gait assistance: Min assist, Mod assist, Min guard Gait Distance (Feet): 100 Feet Assistive device: Rolling walker (2 wheeled) Gait Pattern/deviations: Ataxic, Decreased stride length, Narrow base of support, Step-to pattern, Step-through pattern, Decreased dorsiflexion - right General Gait Details: Slow, cautious gait. Initially requiring min A for steadying. As pt fatigued, pt with more notable ataxia in RLE and required up to mod A for steadying. Gait velocity: Decreased Gait velocity interpretation: <1.31 ft/sec, indicative of household ambulator    ADL: ADL Overall ADL's : Needs assistance/impaired Eating/Feeding: Supervision/ safety, Set up, Sitting Eating/Feeding Details (indicate cue type and reason): sitting upright in bed, pt eating cereal with bananas, pt able to cut up each banana slice with increased time, poor depth perception noted with ataxic movements in RUE Grooming: Min guard, Standing Grooming Details (indicate cue type and reason): standing x3 mins for task Upper Body Bathing: Min guard, Sitting, Cueing for safety, Cueing for sequencing Upper Body Bathing Details (indicate cue type and reason): Pt with cues to hold on with single UE for  support Lower Body Bathing: Supervison/ safety, Set up, Sitting/lateral leans Lower Body Bathing Details (indicate cue type and reason): simulated via posterior pericare Upper Body Dressing : Minimal assistance, Sitting Lower Body Dressing: Minimal assistance, Moderate assistance, Sitting/lateral leans, Cueing for safety Toilet Transfer: Minimal assistance, RW, Stand-pivot, BSC Toilet Transfer Details (indicate cue type and reason): MIN A for steadying assist to pivot from recliner>BSC>EOB Toileting- Clothing Manipulation and Hygiene: Supervision/safety, Set up, Sitting/lateral lean Toileting - Clothing Manipulation Details (indicate cue type and reason): posterior pericare Functional mobility during ADLs: Minimal assistance, Rolling walker General ADL Comments: pt continues to present with impaired RUE MFC, impaired motor planning and atxia in RLE and RUE  Cognition: Cognition Overall Cognitive Status: Impaired/Different from baseline Arousal/Alertness: Awake/alert Orientation Level: Oriented X4 Attention: Sustained Sustained Attention: Impaired Sustained Attention Impairment: Verbal basic Memory: Impaired Memory Impairment: Storage deficit Awareness: Impaired Awareness Impairment: Emergent impairment Problem Solving: Impaired Problem Solving Impairment: Verbal basic Cognition Arousal/Alertness: Awake/alert Behavior During Therapy: WFL for tasks assessed/performed Overall Cognitive Status: Impaired/Different from baseline Area of Impairment: Awareness, Problem solving Safety/Judgement: Decreased awareness of safety, Decreased awareness of deficits Awareness: Emergent Problem Solving: Slow processing, Difficulty sequencing, Requires verbal cues General Comments: Increased time to answer questions and follow commands    Blood pressure (!) 140/92, pulse 74, temperature 97.9 F (36.6 C), temperature source Oral, resp. rate 16, weight 65.7 kg, SpO2 99 %. Physical Exam Vitals and  nursing note reviewed.  Constitutional:      Appearance: Normal appearance. She is obese.     Comments: Up in chair eating lunch.   HENT:     Head: Normocephalic and atraumatic.     Nose: Nose normal.     Mouth/Throat:     Mouth: Mucous membranes are moist.  Eyes:     Extraocular Movements: Extraocular movements intact.     Pupils: Pupils are equal, round, and reactive to light.  Cardiovascular:     Rate and Rhythm: Normal rate and regular rhythm.     Heart sounds: No murmur heard.   No gallop.  Pulmonary:     Effort: Pulmonary effort is normal. No respiratory distress.     Breath sounds: No wheezing.  Abdominal:     General: There is no distension.  Musculoskeletal:        General: No swelling or tenderness. Normal range of motion.     Cervical back: Normal range of motion.  Skin:    General: Skin is warm and dry.  Neurological:     Mental Status: She is alert and oriented to person, place, and time.     Comments: Speech slow and measured. Mild left facial weakness with mild dysarthria. She is able to follow simple commands without difficulty. A little slow to process.  Right sided weakness with ataxia noted in both RUE and RLE. 4/5 RUE and 5/5 LUE. RLE 4/5, LLE 5/5.   Psychiatric:        Mood and Affect: Mood normal.        Behavior: Behavior normal.    Results for orders placed or performed during the hospital encounter of 01/20/21 (from the past 48 hour(s))  Glucose, capillary     Status: Abnormal   Collection Time: 01/28/21 11:01 AM  Result Value Ref Range   Glucose-Capillary 151 (H) 70 - 99 mg/dL    Comment: Glucose reference range applies only to samples taken after fasting for at least 8 hours.  Glucose, capillary     Status: None   Collection Time: 01/28/21  5:00 PM  Result Value Ref Range   Glucose-Capillary 78 70 - 99 mg/dL    Comment: Glucose reference range applies only to samples taken after fasting for at least 8 hours.  Glucose, capillary     Status:  Abnormal   Collection Time: 01/28/21  9:48 PM  Result Value Ref Range   Glucose-Capillary 115 (H) 70 - 99 mg/dL    Comment: Glucose reference range applies only to samples taken after fasting for at least 8 hours.  Basic metabolic panel     Status: Abnormal   Collection Time: 01/29/21  3:59 AM  Result Value Ref Range   Sodium 137 135 - 145 mmol/L   Potassium 4.4 3.5 - 5.1 mmol/L   Chloride 109 98 - 111 mmol/L   CO2 19 (L) 22 - 32 mmol/L   Glucose, Bld 125 (H) 70 - 99 mg/dL    Comment: Glucose reference range applies only to samples taken after fasting for at least 8 hours.   BUN 21 8 - 23 mg/dL   Creatinine, Ser 1.08 (H) 0.44 - 1.00 mg/dL   Calcium 9.2 8.9 - 10.3 mg/dL   GFR, Estimated 57 (L) >60 mL/min    Comment: (NOTE) Calculated using the CKD-EPI Creatinine Equation (2021)    Anion gap 9 5 - 15    Comment: Performed at [facility] [address] [place], [place] [address]  Glucose, capillary     Status: Abnormal   Collection Time: 01/29/21  6:14 AM  Result Value Ref Range   Glucose-Capillary 141 (H) 70 - 99 mg/dL    Comment: Glucose reference range applies only to samples taken after fasting for at least 8 hours.  Glucose, capillary     Status: Abnormal   Collection Time: 01/29/21  7:29 AM  Result Value Ref Range   Glucose-Capillary 151 (H) 70 - 99 mg/dL    Comment: Glucose reference range applies only to samples taken after fasting for at least 8 hours.  Glucose, capillary     Status: Abnormal   Collection Time: 01/29/21 10:59 AM  Result Value Ref Range   Glucose-Capillary 266 (H) 70 - 99 mg/dL  Comment: Glucose reference range applies only to samples taken after fasting for at least 8 hours.  Glucose, capillary     Status: Abnormal   Collection Time: 01/29/21  5:24 PM  Result Value Ref Range   Glucose-Capillary 150 (H) 70 - 99 mg/dL    Comment: Glucose reference range applies only to samples taken after fasting for at least 8 hours.  Glucose, capillary      Status: Abnormal   Collection Time: 01/29/21  9:48 PM  Result Value Ref Range   Glucose-Capillary 141 (H) 70 - 99 mg/dL    Comment: Glucose reference range applies only to samples taken after fasting for at least 8 hours.  Glucose, capillary     Status: Abnormal   Collection Time: 01/30/21  6:07 AM  Result Value Ref Range   Glucose-Capillary 139 (H) 70 - 99 mg/dL    Comment: Glucose reference range applies only to samples taken after fasting for at least 8 hours.   No results found.     Medical Problem List and Plan: 1.  Fxnl and mobility deficits secondary to right cerebellar and left thalamic infarcts potentially d/t vertebral artery dissection  -patient may shower  -ELOS/Goals: 10-14 days, Sup/min assist with PT, OT 2.  Antithrombotics: -DVT/anticoagulation:  Pharmaceutical: Lovenox  -antiplatelet therapy: ASA/plavix 3. Pain Management: Tylenol prn for HA.  4. Mood: LCSW to follow for evaluation and support.   -antipsychotic agents: N/A 5. Neuropsych: This patient is capable of making decisions on her own behalf. 6. Skin/Wound Care: Routine pressure relief measures.  7. Fluids/Electrolytes/Nutrition:  Monitor I/O. Check CMET in am.  8. HTN: Monitor TID  --continue Lisinopril, coreg and Imdur.   -monitor readings with therapy 9. T2DM: Hgb A1c-7.6.  Monitor BS ac/hs and use SSI for elevated BS.   --resume home dose metformin.  -adjust regimen as needed  -diabetic education 10 Seizure disorder: Stable on Keppra bid. 11. Hyperlipidemia: On Statin and Zetia.   --was on Praluent twice a month.  12. Recent bilateral cataract surgery: Completes eye drops for right eye in a week.  44. CAD s/p CABG 2014 : Monitor for symptoms with increase in activity  --On DAPT, Imdur, Lisinopril, coreg and statin (Praluent on hold) 14. Anxiety d/o: Mood stable on Lexapro. Not using Xanax currently.  15.  OSA: Is compliant with CPAP use.       Bary Leriche, PA-C 01/30/2021

## 2021-01-31 LAB — COMPREHENSIVE METABOLIC PANEL
ALT: 30 U/L (ref 0–44)
AST: 30 U/L (ref 15–41)
Albumin: 3.7 g/dL (ref 3.5–5.0)
Alkaline Phosphatase: 100 U/L (ref 38–126)
Anion gap: 10 (ref 5–15)
BUN: 23 mg/dL (ref 8–23)
CO2: 21 mmol/L — ABNORMAL LOW (ref 22–32)
Calcium: 9.4 mg/dL (ref 8.9–10.3)
Chloride: 109 mmol/L (ref 98–111)
Creatinine, Ser: 1.17 mg/dL — ABNORMAL HIGH (ref 0.44–1.00)
GFR, Estimated: 52 mL/min — ABNORMAL LOW (ref >60)
Glucose, Bld: 159 mg/dL — ABNORMAL HIGH (ref 70–99)
Potassium: 4.7 mmol/L (ref 3.5–5.1)
Sodium: 140 mmol/L (ref 135–145)
Total Bilirubin: 0.6 mg/dL (ref 0.3–1.2)
Total Protein: 6.7 g/dL (ref 6.5–8.1)

## 2021-01-31 LAB — CBC WITH DIFFERENTIAL/PLATELET
Abs Immature Granulocytes: 0.03 10*3/uL (ref 0.00–0.07)
Basophils Absolute: 0 10*3/uL (ref 0.0–0.1)
Basophils Relative: 1 %
Eosinophils Absolute: 0.2 10*3/uL (ref 0.0–0.5)
Eosinophils Relative: 3 %
HCT: 40 % (ref 36.0–46.0)
Hemoglobin: 13.4 g/dL (ref 12.0–15.0)
Immature Granulocytes: 1 %
Lymphocytes Relative: 45 %
Lymphs Abs: 2.8 10*3/uL (ref 0.7–4.0)
MCH: 32.1 pg (ref 26.0–34.0)
MCHC: 33.5 g/dL (ref 30.0–36.0)
MCV: 95.9 fL (ref 80.0–100.0)
Monocytes Absolute: 0.7 10*3/uL (ref 0.1–1.0)
Monocytes Relative: 11 %
Neutro Abs: 2.4 10*3/uL (ref 1.7–7.7)
Neutrophils Relative %: 39 %
Platelets: 199 10*3/uL (ref 150–400)
RBC: 4.17 MIL/uL (ref 3.87–5.11)
RDW: 12.7 % (ref 11.5–15.5)
WBC: 6.1 10*3/uL (ref 4.0–10.5)
nRBC: 0 % (ref 0.0–0.2)

## 2021-01-31 LAB — GLUCOSE, CAPILLARY
Glucose-Capillary: 155 mg/dL — ABNORMAL HIGH (ref 70–99)
Glucose-Capillary: 116 mg/dL — ABNORMAL HIGH (ref 70–99)
Glucose-Capillary: 120 mg/dL — ABNORMAL HIGH (ref 70–99)
Glucose-Capillary: 121 mg/dL — ABNORMAL HIGH (ref 70–99)

## 2021-01-31 NOTE — Evaluation (Signed)
Occupational Therapy Assessment and Plan  Patient Details  Name: Samantha Huffman MRN: [id number] Date of Birth: 02-Jan-1956  OT Diagnosis: ataxia, cognitive deficits, hemiplegia affecting dominant side, muscle weakness (generalized), and decreased activity tolerance and slowed gross/fine motor movements impairing ADL/functional mobility performance Rehab Potential: Rehab Potential (ACUTE ONLY): Good ELOS: 9 to 11 days   Today's Date: 01/31/2021 OT Individual Time: 0812-0907 OT Individual Time Calculation (min): 55 min   + 42 min  Hospital Problem: Principal Problem:   Cerebellar stroke [facility])   Past Medical History:  Past Medical History:  Diagnosis Date   Carotid artery stenosis    1-39% bilateral stenosis by dopplers 08/2020   Coronary artery disease    a. s/p CABG 10/2012.  cath 05/2016 with moderate LM stenosis, mild LCx and RCA stenosis and occluded LAD with patent LIMA to LAD, free radial to OM.  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   Dyslipidemia    Family history of adverse reaction to anesthesia    mother and sister ponv   Generalized convulsive epilepsy without mention of intractable epilepsy    GERD (gastroesophageal reflux disease)    History of kidney stones    Hypertension    Memory deficit    slight   Migraine    "controlled on daily RX " (06/11/2016)   Nephrolithiasis 07/30/2015   S/p lithotripsy x 3 and right and left ureter stents   OSA (obstructive sleep apnea)    cpap    PONV (postoperative nausea and vomiting)    Seizures ([place])    "controlled w/daily RX; started in 2012; dr thinks they might be from the migraines; last sz was early part of 2016" (06/11/2016)   Type II diabetes mellitus ([place])    metformin   Vasovagal syncope 07/30/2015   2011   Past Surgical History:  Past Surgical History:  Procedure Laterality Date   CARDIAC CATHETERIZATION  2014; 06/11/2016   CARDIAC CATHETERIZATION N/A 06/11/2016   Procedure: Left Heart Cath and Cors/Grafts  Angiography;  Surgeon: Sherren Mocha, MD;  Location: [facility];  Service: Cardiovascular;  Laterality: N/A;   CESAREAN SECTION     CORONARY ANGIOPLASTY     CORONARY ARTERY BYPASS GRAFT  March, 2014   LIMA to LAD, left radial to LCx x 2   CYSTOSCOPY W/ URETERAL STENT PLACEMENT Left 01/16/2019   Procedure: CYSTOSCOPY WITH RETROGRADE PYELOGRAM/URETERAL LEFT STENT PLACEMENT;  Surgeon: Ardis Hughs, MD;  Location: [facility];  Service: Urology;  Laterality: Left;   CYSTOSCOPY/URETEROSCOPY/HOLMIUM LASER/STENT PLACEMENT Left 01/24/2019   Procedure: CYSTOSCOPY, LEFT URETEROSCOPY, HOLMIUM LASER, STONE REMOVAL AND STENT EXCHANGE;  Surgeon: Ardis Hughs, MD;  Location: [facility];  Service: Urology;  Laterality: Left;   LITHOTRIPSY  X 3   RIGHT/LEFT HEART CATH AND CORONARY/GRAFT ANGIOGRAPHY N/A 09/08/2018   Procedure: RIGHT/LEFT HEART CATH AND CORONARY/GRAFT ANGIOGRAPHY;  Surgeon: Martinique, Peter M, MD;  Location: [facility];  Service: Cardiovascular;  Laterality: N/A;   TOTAL ABDOMINAL HYSTERECTOMY     ovaries took before hysterectomy   TUBAL LIGATION     URETERAL STENT PLACEMENT      Assessment & Plan Clinical Impression: Patient is a 65 y.o. year old female with history of CAD, HTN, T2DM, OSA, renal calculi, seizure d/o; who was admitted on 01/20/21 with onset of weakness right side with unsteady gait for 2-3 days. She reported having lifted a case of water the day before and started feeling dizzy. She ws found to have occlusion of  R-SCA and acute/subacute right cerebellar stroke with small infarcts in right PICA and left thalamus. 2 D echo showed EF 60-65% with trivial MVR. DR. Xu questioned vertebral dissection as cause of stroke and repeat CT head in 2 days to rule out hydrocephalus. ASA increased to 325 mg with recommendations of DAPT X 3 months followed by lower dose ASA/Plavix as per home regimen.  Follow up CT head 06/3 and 06/05 showed stable right cerebellar and thalamic  infarcts with out hemorrhage or mass effect. She continues to have deficits due to right sided weakness with ataxia & sensory deficits, dizziness as well as visual deficits affecting ADLs and mobility. CIR recommended due to functional decline.  Patient transferred to CIR on 01/30/2021 .    Patient currently requires min with basic self-care skills secondary to muscle weakness, decreased cardiorespiratoy endurance, impaired timing and sequencing, unbalanced muscle activation, ataxia, decreased coordination, and decreased motor planning, decreased visual perceptual skills and dipolopia, decreased motor planning, decreased awareness, decreased problem solving, decreased safety awareness, and delayed processing, and decreased standing balance and hemiplegia.  Prior to hospitalization, patient could complete BADL/IADL with independent .  Patient will benefit from skilled intervention to increase independence with basic self-care skills prior to discharge home with care partner.  Anticipate patient will require 24 hour supervision and follow up outpatient.  OT - End of Session Endurance Deficit: Yes   OT Evaluation Precautions/Restrictions  Precautions Precautions: Fall Precaution Comments: ataxia, RLE/RUE Restrictions Weight Bearing Restrictions: No General Chart Reviewed: Yes Family/Caregiver Present: No  Pain  denies Home Living/Prior Functioning Home Living Family/patient expects to be discharged to:: Private residence Living Arrangements: Alone Available Help at Discharge: Family, Available PRN/intermittently Type of Home: Apartment Home Access: Level entry Home Layout: One level Bathroom Shower/Tub: Government social research officer Accessibility: Yes Additional Comments: lived alone in apartment  Lives With: Alone IADL History Homemaking Responsibilities: Yes Meal Prep Responsibility: Primary Laundry Responsibility: Primary Cleaning Responsibility:  Primary Bill Paying/Finance Responsibility: Primary Shopping Responsibility: Primary Child Care Responsibility: No Current License: Yes Occupation: Retired Leisure and Hobbies: running errands/shopping with her daughters Prior Function Level of Independence: Independent with basic ADLs, Independent with homemaking with ambulation, Independent with gait  Able to Take Stairs?: Yes Driving: Yes Vocation: On disability Vision Baseline Vision/History: Wears glasses;Cataracts Wears Glasses: Reading only Patient Visual Report: Blurring of vision;Other (comment) Vision Assessment?: Yes Eye Alignment: Impaired (comment) (mild dysconjugate gaze on R) Ocular Range of Motion: Within Functional Limits Tracking/Visual Pursuits: Able to track stimulus in all quads without difficulty Saccades: Additional eye shifts occurred during testing;Decreased speed of saccadic movement Convergence: Within functional limits Visual Fields: Left visual field deficit Diplopia Assessment: Disappears with one eye closed Additional Comments: mild lateral field cut on the L Perception  Perception: Impaired Praxis Praxis: Impaired Praxis Impairment Details: Motor planning Cognition Overall Cognitive Status: Impaired/Different from baseline Arousal/Alertness: Awake/alert Orientation Level: Person;Place;Situation Person: Oriented Place: Oriented Situation: Oriented Year: 2022 Month: June Day of Week: Correct Immediate Memory Recall: Sock;Blue;Bed Memory Recall Sock: Without Cue Memory Recall Blue: Without Cue Memory Recall Bed: Without Cue Attention: Sustained Sustained Attention: Impaired Sustained Attention Impairment: Verbal complex;Functional complex Safety/Judgment: Appears intact Sensation Sensation Light Touch: Impaired Detail Peripheral sensation comments: delayed response to identify LT to R digits Hot/Cold: Not tested Proprioception: Appears Intact Stereognosis: Appears  Intact Coordination Gross Motor Movements are Fluid and Coordinated: No Fine Motor Movements are Fluid and Coordinated: No Coordination and Movement Description: ataxia on the R side UE and LE Finger  Nose Finger Test: RUE distal ataxia worse tham proximal, slow on the R Heel Shin Test: R sided ataxia Motor  Motor Motor: Hemiplegia;Ataxia;Motor apraxia Motor - Skilled Clinical Observations: mild R sided hemiplegia, ataxia  Trunk/Postural Assessment  Cervical Assessment Cervical Assessment: Within Functional Limits Thoracic Assessment Thoracic Assessment: Exceptions to [facility] (mild rounded shoulders) Lumbar Assessment Lumbar Assessment: Exceptions to [facility] (posterior pelvic tilt) Postural Control Postural Control: Deficits on evaluation (decreased dynamic standing balance)  Balance Balance Balance Assessed: Yes Static Sitting Balance Static Sitting - Balance Support: Feet supported Static Sitting - Level of Assistance: 5: Stand by assistance Dynamic Sitting Balance Dynamic Sitting - Balance Support: Feet supported Dynamic Sitting - Level of Assistance: 5: Stand by assistance Static Standing Balance Static Standing - Balance Support: During functional activity;Bilateral upper extremity supported Static Standing - Level of Assistance: 5: Stand by assistance Dynamic Standing Balance Dynamic Standing - Balance Support: During functional activity;Bilateral upper extremity supported Dynamic Standing - Level of Assistance: 4: Min assist Extremity/Trunk Assessment RUE Assessment RUE Assessment: Exceptions to [facility] Active Range of Motion (AROM) Comments: 3/4 to full shoulder flexion General Strength Comments: 4/5 in shoulder flexion, 59 lbs grip strength RUE Body System: Neuro Brunstrum levels for arm and hand: Arm;Hand Brunstrum level for arm: Stage IV Movement is deviating from synergy Brunstrum level for hand: Stage IV Movements deviating from synergies LUE Assessment LUE Assessment:  Within Functional Limits  Care Tool Care Tool Self Care Eating   Eating Assist Level: Minimal Assistance - Patient > 75%    Oral Care    Oral Care Assist Level: Supervision/Verbal cueing    Bathing   Body parts bathed by patient: Left arm;Chest;Abdomen;Right arm;Left upper leg;Right upper leg;Front perineal area;Buttocks;Right lower leg;Left lower leg;Face     Assist Level: Minimal Assistance - Patient > 75%    Upper Body Dressing(including orthotics)   What is the patient wearing?: Bra;Pull over shirt   Assist Level: Supervision/Verbal cueing    Lower Body Dressing (excluding footwear)   What is the patient wearing?: Underwear/pull up;Pants Assist for lower body dressing: Minimal Assistance - Patient > 75%    Putting on/Taking off footwear   What is the patient wearing?: Non-skid slipper socks Assist for footwear: Minimal Assistance - Patient > 75%       Care Tool Toileting Toileting activity   Assist for toileting: Minimal Assistance - Patient > 75%     Care Tool Bed Mobility Roll left and right activity   Roll left and right assist level: Contact Guard/Touching assist    Sit to lying activity   Sit to lying assist level: Contact Guard/Touching assist    Lying to sitting edge of bed activity   Lying to sitting edge of bed assist level: Contact Guard/Touching assist     Care Tool Transfers Sit to stand transfer   Sit to stand assist level: Minimal Assistance - Patient > 75%    Chair/bed transfer   Chair/bed transfer assist level: Minimal Assistance - Patient > 75%     Toilet transfer   Assist Level: Minimal Assistance - Patient > 75%     Care Tool Cognition Expression of Ideas and Wants Expression of Ideas and Wants: Some difficulty - exhibits some difficulty with expressing needs and ideas (e.g, some words or finishing thoughts) or speech is not clear   Understanding Verbal and Non-Verbal Content Understanding Verbal and Non-Verbal Content: Usually  understands - understands most conversations, but misses some part/intent of message. Requires cues at times to understand  Memory/Recall Ability *first 3 days only Memory/Recall Ability *first 3 days only: Current season;That he or she is in a hospital/hospital unit    Refer to Care Plan for [place] 1 OT Short Term Goal 1 (Week 1): Pt will complete either walk-in or tub/shower transfer with min A + LRAD. OT Short Term Goal 2 (Week 1): Pt will maintain static standing balance for at least 5 min with close in prep for standing ADL. OT Short Term Goal 3 (Week 1): Pt will ind state 2 energy conservation strategies to incoporate into her daily routine upon DC. OT Short Term Goal 4 (Week 1): Pt will bathe full-body with CGA.  Recommendations for other services: Neuropsych   Skilled Therapeutic Intervention ADL ADL Eating: Minimal assistance Where Assessed-Eating: Edge of bed Grooming: Supervision/safety Where Assessed-Grooming: Sitting at sink Upper Body Bathing: Supervision/safety Where Assessed-Upper Body Bathing: Sitting at sink Lower Body Bathing: Minimal assistance Where Assessed-Lower Body Bathing: Standing at sink Upper Body Dressing: Supervision/safety Where Assessed-Upper Body Dressing: Sitting at sink Lower Body Dressing: Minimal cueing Where Assessed-Lower Body Dressing: Standing at sink Toileting: Minimal assistance Where Assessed-Toileting: Glass blower/designer: Psychiatric nurse Method: Arts development officer: Raised toilet seat;Grab bars Tub/Shower Transfer: Not assessed Social research officer, government: Not assessed Mobility  Bed Mobility Bed Mobility: Rolling Left;Supine to Sit;Sit to Supine Rolling Right: Supervision/verbal cueing Rolling Left: Supervision/Verbal cueing Supine to Sit: Supervision/Verbal cueing Sit to Supine: Supervision/Verbal cueing Transfers Sit to Stand: Minimal Assistance - Patient >  75%  Session Note: Pt received seated EOB eating breakfast, denies pain and agreeable to OT eval. Pt initially upset , but did not specify. When asked stated "look around." Pt speaking/moving slowly throughout session, and affected improved towards end of session after getting dressed.  Pt noted to self-feed herself with dominant RUE, but req increased time and use of LUE to stabilize 2/2 ataxic movements. Reviewed role of CIR OT, evaluation process, ADL/func mobility retraining, goals for therapy, and safety plan. Evaluation completed as documented above with session focus on func mobility, toileting, and sinkside bathing/dressing. Stand-pivot throughout session min A with RW and use of grab bar, minimal difficulty motor planning transfer req min Vcs to direct. Cont void of b/b at toilet, req min A for balance during LB clothing management and transfer. Seated at sink, pt perseverative on appearance of R eye and becoming slightly emotional. However, demonstrated improved affect after getting wash-up. Pt left seated EOB with bed alarm engaged, call bell in reach, and all immediate needs met.    Session 2: Pt received semi-reclined in bed, agreeable to therapy but req to finish lunch. Denies pain. Session focus on RUE NMR/ [place] in prep for improved ADL performance. Came to sitting EOB close S. Stand-pivot to and from w/c min A for hand-held A. Assessed B grip strength + administered 9HPT with the below results:  R: 62, 55, 54; average of 57 lbs; 1 min 6 secs L: 51, 52, 49; average of 51 lbs; 30 secs  Additionally, issued coloring sheets + medium soft theraputty and instructed pt in the following exercises for improved RUE NMR: rolling putty into log/ball shapes, pinch and pull, and removing beads from putty. Pt very motivated to work on activities between therapy. Stand-pivot back to bed same manner as before, returned to supine close S.  Pt left semi-reclined in bed with bed alarm engaged, call bell in  reach, and all immediate needs met.   Discharge  Criteria: Patient will be discharged from OT if patient refuses treatment 3 consecutive times without medical reason, if treatment goals not met, if there is a change in medical status, if patient makes no progress towards goals or if patient is discharged from hospital.  The above assessment, treatment plan, treatment alternatives and goals were discussed and mutually agreed upon: by patient  Volanda Napoleon MS, OTR/L   01/31/2021, 1:09 PM

## 2021-01-31 NOTE — Evaluation (Signed)
Physical Therapy Assessment and Plan  Patient Details  Name: Samantha Huffman MRN: [id number] Date of Birth: Mar 13, 1956  PT Diagnosis: Abnormal posture, Abnormality of gait, Ataxia, Ataxic gait, Coordination disorder, Hemiparesis dominant, and Muscle weakness Rehab Potential: Good ELOS: 10-14 days   Today's Date: 01/31/2021 PT Individual Time: 1102-1200 and 1530-1616 PT Individual Time Calculation (min): 58 min  and 46 min    Hospital Problem: Principal Problem:   Cerebellar stroke [facility])   Past Medical History:  Past Medical History:  Diagnosis Date   Carotid artery stenosis    1-39% bilateral stenosis by dopplers 08/2020   Coronary artery disease    a. s/p CABG 10/2012.  cath 05/2016 with moderate LM stenosis, mild LCx and RCA stenosis and occluded LAD with patent LIMA to LAD, free radial to OM.  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   Dyslipidemia    Family history of adverse reaction to anesthesia    mother and sister ponv   Generalized convulsive epilepsy without mention of intractable epilepsy    GERD (gastroesophageal reflux disease)    History of kidney stones    Hypertension    Memory deficit    slight   Migraine    "controlled on daily RX " (06/11/2016)   Nephrolithiasis 07/30/2015   S/p lithotripsy x 3 and right and left ureter stents   OSA (obstructive sleep apnea)    cpap    PONV (postoperative nausea and vomiting)    Seizures ([place])    "controlled w/daily RX; started in 2012; dr thinks they might be from the migraines; last sz was early part of 2016" (06/11/2016)   Type II diabetes mellitus ([place])    metformin   Vasovagal syncope 07/30/2015   2011   Past Surgical History:  Past Surgical History:  Procedure Laterality Date   CARDIAC CATHETERIZATION  2014; 06/11/2016   CARDIAC CATHETERIZATION N/A 06/11/2016   Procedure: Left Heart Cath and Cors/Grafts Angiography;  Surgeon: Sherren Mocha, MD;  Location: [place] [facility];  Service: Cardiovascular;   Laterality: N/A;   CESAREAN SECTION     CORONARY ANGIOPLASTY     CORONARY ARTERY BYPASS GRAFT  March, 2014   LIMA to LAD, left radial to LCx x 2   CYSTOSCOPY W/ URETERAL STENT PLACEMENT Left 01/16/2019   Procedure: CYSTOSCOPY WITH RETROGRADE PYELOGRAM/URETERAL LEFT STENT PLACEMENT;  Surgeon: Ardis Hughs, MD;  Location: [facility];  Service: Urology;  Laterality: Left;   CYSTOSCOPY/URETEROSCOPY/HOLMIUM LASER/STENT PLACEMENT Left 01/24/2019   Procedure: CYSTOSCOPY, LEFT URETEROSCOPY, HOLMIUM LASER, STONE REMOVAL AND STENT EXCHANGE;  Surgeon: Ardis Hughs, MD;  Location: [facility];  Service: Urology;  Laterality: Left;   LITHOTRIPSY  X 3   RIGHT/LEFT HEART CATH AND CORONARY/GRAFT ANGIOGRAPHY N/A 09/08/2018   Procedure: RIGHT/LEFT HEART CATH AND CORONARY/GRAFT ANGIOGRAPHY;  Surgeon: Martinique, Peter M, MD;  Location: [place] [facility];  Service: Cardiovascular;  Laterality: N/A;   TOTAL ABDOMINAL HYSTERECTOMY     ovaries took before hysterectomy   TUBAL LIGATION     URETERAL STENT PLACEMENT      Assessment & Plan Clinical Impression: Patient is a 65 year old RH-female with history of CAD, HTN, T2DM, OSA, renal calculi, seizure d/o; who was admitted on 01/20/21 with onset of weakness right side with unsteady gait for 2-3 days. She reported having lifted a case of water the day before and started feeling dizzy. She ws found to have occlusion of R-SCA and acute/subacute right cerebellar stroke with small infarcts in right PICA  and left thalamus. 2 D echo showed EF 60-65% with trivial MVR. DR. Xu questioned vertebral dissection as cause of stroke and repeat CT head in 2 days to rule out hydrocephalus. ASA increased to 325 mg with recommendations of DAPT X 3 months followed by lower dose ASA/Plavix as per home regimen.  Follow up CT head 06/3 and 06/05 showed stable right cerebellar and thalamic infarcts with out hemorrhage or mass effect. She continues to have deficits due to right sided weakness with  ataxia & sensory deficits, dizziness as well as visual deficits affecting ADLs and mobility. Patient transferred to CIR on 01/30/2021 .   Patient currently requires mod with mobility secondary to muscle weakness and muscle joint tightness, impaired timing and sequencing, ataxia, decreased coordination, and decreased motor planning, decreased visual acuity and field cut, decreased motor planning, central origin, and decreased sitting balance, decreased standing balance, decreased postural control, hemiplegia, and decreased balance strategies.  Prior to hospitalization, patient was independent  with mobility and lived with Alone in a [place] home.  Home access is  Level entry.  Patient will benefit from skilled PT intervention to maximize safe functional mobility, minimize fall risk, and decrease caregiver burden for planned discharge home with 24 hour supervision.  Anticipate patient will benefit from follow up [place] at discharge.  PT - End of Session Activity Tolerance: Tolerates 10 - 20 min activity with multiple rests Endurance Deficit: Yes PT Assessment Rehab Potential (ACUTE/IP ONLY): Good PT Barriers to Discharge: Decreased caregiver support;Home environment access/layout;Insurance for SNF coverage;Medication compliance PT Patient demonstrates impairments in the following area(s): Balance;Behavior;Endurance;Motor;Perception;Safety PT Transfers Functional Problem(s): Bed Mobility;Bed to Chair;Car;Furniture;Floor PT Locomotion Functional Problem(s): Ambulation;Wheelchair Mobility;Stairs PT Plan PT Intensity: Minimum of 1-2 x/day ,45 to 90 minutes PT Frequency: 5 out of 7 days PT Duration Estimated Length of Stay: 10-14 days PT Treatment/Interventions: Ambulation/gait training;Cognitive remediation/compensation;Balance/vestibular training;Community reintegration;Discharge planning;Disease management/prevention;DME/adaptive equipment instruction;Functional electrical stimulation;Functional mobility  training;Neuromuscular re-education;Pain management;Patient/family education;Psychosocial support;Skin care/wound management;Splinting/orthotics;Stair training;Therapeutic Activities;Therapeutic Exercise;UE/LE Strength taining/ROM;UE/LE Coordination activities;Visual/perceptual remediation/compensation;Wheelchair propulsion/positioning PT Transfers Anticipated Outcome(s): Supervision assist with LRAD PT Locomotion Anticipated Outcome(s): AMbulatory with LRAD at [place] assist level. PT Recommendation Recommendations for Other Services: Therapeutic Recreation consult;Neuropsych consult Therapeutic Recreation Interventions: Clinical cytogeneticist;Outing/community reintergration Follow Up Recommendations: Home health PT Patient destination: Home   PT Evaluation Precautions/Restrictions   Fall  General   Vital Signs see below Pain   denies Home Living/Prior Functioning Home Living Available Help at Discharge: Family;Available PRN/intermittently Type of Home: Apartment Home Access: Level entry Home Layout: One level Bathroom Shower/Tub: Tub/shower unit Additional Comments: lived alone in apartment  Lives With: Alone Prior Function Level of Independence: Independent with basic ADLs;Independent with homemaking with ambulation;Independent with gait  Able to Take Stairs?: Yes Driving: Yes Vocation: On disability Vision/Perception  Vision - Assessment Eye Alignment: Within Functional Limits Ocular Range of Motion: Within Functional Limits Tracking/Visual Pursuits: Able to track stimulus in all quads without difficulty (mild ptosis on the R Eye) Additional Comments: mild lateral field cut on the L Praxis Praxis: Impaired Praxis Impairment Details: Motor planning;Ideomotor  Cognition   Sensation Sensation Light Touch: Appears Intact Proprioception: Appears Intact Coordination Gross Motor Movements are Fluid and Coordinated: No Fine Motor Movements are Fluid and  Coordinated: No Coordination and Movement Description: ataxia on the R side UE and LE Finger Nose Finger Test: RUE distal ataxia worse tham proximal, slow on the R Heel Shin Test: R sided ataxia Motor  Motor Motor: Hemiplegia;Ataxia;Motor apraxia Motor - Skilled Clinical Observations: mild R  sided hemiplegia, ataxia   Trunk/Postural Assessment  Cervical Assessment Cervical Assessment: Within Functional Limits Thoracic Assessment Thoracic Assessment: Exceptions to [facility] (mild rounded shoulders) Lumbar Assessment Lumbar Assessment: Exceptions to [facility] (posterior pelvic tilt) Postural Control Postural Control: Deficits on evaluation (decreased dynamic standing balance)  Balance Balance Balance Assessed: Yes Static Sitting Balance Static Sitting - Balance Support: Feet supported Static Sitting - Level of Assistance: 5: Stand by assistance Dynamic Sitting Balance Dynamic Sitting - Balance Support: Feet supported Dynamic Sitting - Level of Assistance: 5: Stand by assistance Static Standing Balance Static Standing - Balance Support: During functional activity;Bilateral upper extremity supported Static Standing - Level of Assistance: 5: Stand by assistance Dynamic Standing Balance Dynamic Standing - Balance Support: During functional activity;Bilateral upper extremity supported Dynamic Standing - Level of Assistance: 4: Min assist Extremity Assessment      RLE Assessment RLE Assessment: Within Functional Limits General Strength Comments: grossly 4/5 to 4+/5 proximal to distal LLE Assessment LLE Assessment: Within Functional Limits General Strength Comments: grossly 4+/5 to 5/5  Care Tool Care Tool Bed Mobility Roll left and right activity   Roll left and right assist level: Contact Guard/Touching assist    Sit to lying activity   Sit to lying assist level: Contact Guard/Touching assist    Lying to sitting edge of bed activity   Lying to sitting edge of bed assist level:  Contact Guard/Touching assist     Care Tool Transfers Sit to stand transfer   Sit to stand assist level: Moderate Assistance - Patient 50 - 74%    Chair/bed transfer   Chair/bed transfer assist level: Moderate Assistance - Patient 50 - 74%     Toilet transfer   Assist Level: Contact Guard/Touching assist    Car transfer   Car transfer assist level: Moderate Assistance - Patient 50 - 74%      Care Tool Locomotion Ambulation   Assist level: Moderate Assistance - Patient 50 - 74% Assistive device: Hand held assist Max distance: 20  Walk 10 feet activity   Assist level: Moderate Assistance - Patient - 50 - 74% Assistive device: Hand held assist   Walk 50 feet with 2 turns activity Walk 50 feet with 2 turns activity did not occur: Safety/medical concerns      Walk 150 feet activity Walk 150 feet activity did not occur: Safety/medical concerns      Walk 10 feet on uneven surfaces activity Walk 10 feet on uneven surfaces activity did not occur: Safety/medical concerns      Stairs Stair activity did not occur: Safety/medical concerns        Walk up/down 1 step activity Walk up/down 1 step or curb (drop down) activity did not occur: Safety/medical concerns     Walk up/down 4 steps activity did not occuR: Safety/medical concerns  Walk up/down 4 steps activity      Walk up/down 12 steps activity Walk up/down 12 steps activity did not occur: Safety/medical concerns      Pick up small objects from floor Pick up small object from the floor (from standing position) activity did not occur: Safety/medical concerns      Wheelchair       Wheelchair assist level: Minimal Assistance - Patient > 75% Max wheelchair distance: 150  Wheel 50 feet with 2 turns activity   Assist Level: Minimal Assistance - Patient > 75%  Wheel 150 feet activity   Assist Level: Minimal Assistance - Patient > 75%    Refer to Care Plan for  Long Term Goals  SHORT TERM GOAL WEEK 1 PT Short Term Goal 1  (Week 1): Pt will trasnfer to and from bed with CGA and LRAD PT Short Term Goal 2 (Week 1): Pt will perform gait training with LRAD and CGA >129f PT Short Term Goal 3 (Week 1): Pt will complete Berg balance test  Recommendations for other services: Neuropsych and [employer], [employer], Stress management, and Outing/community reintegration  Skilled Therapeutic Intervention  Session 1  Pt received supine in bed and agreeable to PT. Supine>sit transfer with CG assist cues for safety for posture once EOB. PT instructed patient in PT Evaluation and initiated treatment intervention; see above for results. PT educated patient in [place] rehab potential, rehab goals, and discharge recommendations along with recommendation for follow-up rehabilitation services. Gait training with and with AD as listed. Stair management training with BUE support x 8, pt able to perform with min assist fading to mod assist with fatigue and increased ataxia. Car transfer training with mod assist and no AD. Pt provided with 16 x 16 WC and performed WC mobility training with min assist to return room. Pt transfer to bed with mod assist through stand pivot transfer. Sit>supine completed with supervision assist, and left supine in bed with call bell in reach and all needs met.   Session 2.   Pt received supine in bed and agreeable to PT. Supine>sit transfer with supervision assist and cues for safety once EOB. [facility] and [facility]HA on the R[facility].   [facility] Pt performed 5xSTS: 32 sec (>15 sec indicates increased fall risk) pt reports mild dizziness upon completion. VS assessment in sitting: 130/60 HR 64. Standing: 132/66 HR 64.  PT instructed pt in TUG: 48 sec (average of 3 trials 56,44, 46; >13.5 sec indicates increased fall risk) Pt instructed in 10MWT: 0.1054m (>0.45m101mindicates decreased community ambulation) Pt returned to room and performed stand pivot  transfer to bed with min A. Sit>supine completed with supervision assist, and left supine in bed with call bell in reach and all needs met.      Mobility Bed Mobility Bed Mobility: Rolling Right;Rolling Left;Supine to Sit;Sit to Supine Rolling Right: Supervision/verbal cueing Rolling Left: Supervision/Verbal cueing Supine to Sit: Supervision/Verbal cueing Sit to Supine: Supervision/Verbal cueing Transfers Transfers: Sit to Stand;Stand Pivot Transfers Sit to Stand: Minimal Assistance - Patient > 75% Stand Pivot Transfers: Minimal Assistance - Patient > 75% Transfer (Assistive device): Rolling walker Locomotion  Gait Ambulation: Yes Gait Assistance: Minimal Assistance - Patient > 75% Gait Distance (Feet): 150 Feet Assistive device: Rolling walker Gait Gait: Yes Gait Pattern: Impaired Gait Pattern: Ataxic;Narrow base of support Gait velocity: Decreased Stairs / Additional Locomotion Stairs: Yes Stairs Assistance: Minimal Assistance - Patient > 75%;Moderate Assistance - Patient 50 - 74% Stair Management Technique: Two rails Number of Stairs: 8 Height of Stairs: 6 Wheelchair Mobility Wheelchair Mobility: Yes Wheelchair Assistance: Minimal assistance - Patient >75% Wheelchair Propulsion: Both upper extremities Wheelchair Parts Management: Needs assistance Distance: 150   Discharge Criteria: Patient will be discharged from PT if patient refuses treatment 3 consecutive times without medical reason, if treatment goals not met, if there is a change in medical status, if patient makes no progress towards goals or if patient is discharged from hospital.  The above assessment, treatment plan, treatment alternatives and goals were discussed and mutually agreed upon: by patient  AusLorie Phenix11/2022, 12:40 PM

## 2021-01-31 NOTE — Progress Notes (Signed)
PROGRESS NOTE   Subjective/Complaints:  Pt reports "OK"- sleeping hard with CPAP in place- nasal CPAP- no complaints when woke briefly.    ROS: .limited by sedation  Objective:   No results found. Recent Labs    01/31/21 0501  WBC 6.1  HGB 13.4  HCT 40.0  PLT 199   Recent Labs    01/29/21 0359 01/31/21 0501  NA 137 140  K 4.4 4.7  CL 109 109  CO2 19* 21*  GLUCOSE 125* 159*  BUN 21 23  CREATININE 1.08* 1.17*  CALCIUM 9.2 9.4    Intake/Output Summary (Last 24 hours) at 01/31/2021 1507 Last data filed at 01/31/2021 1300 Gross per 24 hour  Intake 596 ml  Output --  Net 596 ml        Physical Exam: Vital Signs Blood pressure 133/71, pulse 63, temperature 98.9 F (37.2 C), temperature source Oral, resp. rate 18, height 5\' 1"  (1.549 m), weight 65.2 kg, SpO2 97 %.      General: asleep- woke briefly, laying supine in bed- nasal CPAP in place; NAD HENT: eyes closed; oropharynx moist CV: regular rate; no JVD Pulmonary: CTA B/L; no W/R/R- good air movement GI: soft, NT, ND, (+)BS Psychiatric: sedated Neurological: sedated Musculoskeletal:        General: No swelling or tenderness. Normal range of motion.    Cervical back: Normal range of motion. Skin:    General: Skin is warm and dry. Neurological:    Mental Status: She is alert and oriented to person, place, and time.    Comments: Speech slow and measured. Mild left facial weakness with mild dysarthria. She is able to follow simple commands without difficulty. A little slow to process.  Right sided weakness with ataxia noted in both RUE and RLE. 4/5 RUE and 5/5 LUE. RLE 4/5, LLE 5/5.    Assessment/Plan: 1. Functional deficits which require 3+ hours per day of interdisciplinary therapy in a comprehensive inpatient rehab setting. Physiatrist is providing close team supervision and 24 hour management of active medical problems listed below. Physiatrist  and rehab team continue to assess barriers to discharge/monitor patient progress toward functional and medical goals  Care Tool:  Bathing    Body parts bathed by patient: Left arm, Chest, Abdomen, Right arm, Left upper leg, Right upper leg, Front perineal area, Buttocks, Right lower leg, Left lower leg, Face         Bathing assist Assist Level: Minimal Assistance - Patient > 75%     Upper Body Dressing/Undressing Upper body dressing   What is the patient wearing?: Bra, Pull over shirt    Upper body assist Assist Level: Supervision/Verbal cueing    Lower Body Dressing/Undressing Lower body dressing      What is the patient wearing?: Underwear/pull up, Pants     Lower body assist Assist for lower body dressing: Minimal Assistance - Patient > 75%     Toileting Toileting    Toileting assist Assist for toileting: Minimal Assistance - Patient > 75%     Transfers Chair/bed transfer  Transfers assist     Chair/bed transfer assist level: Minimal Assistance - Patient > 75%     Locomotion  Ambulation   Ambulation assist      Assist level: Moderate Assistance - Patient 50 - 74% Assistive device: Hand held assist Max distance: 20   Walk 10 feet activity   Assist     Assist level: Moderate Assistance - Patient - 50 - 74% Assistive device: Hand held assist   Walk 50 feet activity   Assist Walk 50 feet with 2 turns activity did not occur: Safety/medical concerns         Walk 150 feet activity   Assist Walk 150 feet activity did not occur: Safety/medical concerns         Walk 10 feet on uneven surface  activity   Assist Walk 10 feet on uneven surfaces activity did not occur: Safety/medical concerns         Wheelchair     Assist        Wheelchair assist level: Minimal Assistance - Patient > 75% Max wheelchair distance: 150    Wheelchair 50 feet with 2 turns activity    Assist        Assist Level: Minimal Assistance -  Patient > 75%   Wheelchair 150 feet activity     Assist      Assist Level: Minimal Assistance - Patient > 75%   Blood pressure 133/71, pulse 63, temperature 98.9 F (37.2 C), temperature source Oral, resp. rate 18, height 5\' 1"  (1.549 m), weight 65.2 kg, SpO2 97 %.  Medical Problem List and Plan: 1.  Fxnl and mobility deficits secondary to right cerebellar and left thalamic infarcts potentially d/t vertebral artery dissection             -patient may shower             -ELOS/Goals: 10-14 days, Sup/min assist with PT, OT  -con't PT and OT- first day of therapies/evaluations 2.  Antithrombotics: -DVT/anticoagulation:  Pharmaceutical: Lovenox             -antiplatelet therapy: ASA/plavix 3. Pain Management: Tylenol prn for HA.  4. Mood: LCSW to follow for evaluation and support.              -antipsychotic agents: N/A 5. Neuropsych: This patient is capable of making decisions on her own behalf. 6. Skin/Wound Care: Routine pressure relief measures.  7. Fluids/Electrolytes/Nutrition:  Monitor I/O. Check CMET in am.  8. HTN: Monitor TID             --continue Lisinopril, coreg and Imdur.  6/11- BP's con't -con't regimen             -monitor readings with therapy 9. T2DM: Hgb A1c-7.6.  Monitor BS ac/hs and use SSI for elevated BS.                  --resume home dose metformin.             -adjust regimen as needed             -diabetic education  6/11- Pt's BG's 107-199- only 1 >160- con't regimen and monitor for trend.  10 Seizure disorder: Stable on Keppra bid. 11. Hyperlipidemia: On Statin and Zetia.              --was on Praluent twice a month.  12. Recent bilateral cataract surgery: Completes eye drops for right eye in a week.  32. CAD s/p CABG 2014 : Monitor for symptoms with increase in activity             --On  DAPT, Imdur, Lisinopril, coreg and statin (Praluent on hold) 14. Anxiety d/o: Mood stable on Lexapro. Not using Xanax currently. 15.  OSA: Is compliant with CPAP  use.  6/11- using CPAP this AM.       LOS: 1 days A FACE TO FACE EVALUATION WAS PERFORMED  Elier Zellars 01/31/2021, 3:07 PM

## 2021-01-31 NOTE — Plan of Care (Signed)
  Problem: RH Balance Goal: LTG Patient will maintain dynamic standing with ADLs (OT) Description: LTG:  Patient will maintain dynamic standing balance with assist during activities of daily living (OT)  Flowsheets (Taken 01/31/2021 0856) LTG: Pt will maintain dynamic standing balance during ADLs with: Supervision/Verbal cueing   Problem: Sit to Stand Goal: LTG:  Patient will perform sit to stand in prep for activites of daily living with assistance level (OT) Description: LTG:  Patient will perform sit to stand in prep for activites of daily living with assistance level (OT) Flowsheets (Taken 01/31/2021 0856) LTG: PT will perform sit to stand in prep for activites of daily living with assistance level: Supervision/Verbal cueing   Problem: RH Eating Goal: LTG Patient will perform eating w/assist, cues/equip (OT) Description: LTG: Patient will perform eating with assist, with/without cues using equipment (OT) Flowsheets (Taken 01/31/2021 0856) LTG: Pt will perform eating with assistance level of: Supervision/Verbal cueing   Problem: RH Grooming Goal: LTG Patient will perform grooming w/assist,cues/equip (OT) Description: LTG: Patient will perform grooming with assist, with/without cues using equipment (OT) Flowsheets (Taken 01/31/2021 0856) LTG: Pt will perform grooming with assistance level of: Supervision/Verbal cueing   Problem: RH Bathing Goal: LTG Patient will bathe all body parts with assist levels (OT) Description: LTG: Patient will bathe all body parts with assist levels (OT) Flowsheets (Taken 01/31/2021 0856) LTG: Pt will perform bathing with assistance level/cueing: Supervision/Verbal cueing   Problem: RH Dressing Goal: LTG Patient will perform upper body dressing (OT) Description: LTG Patient will perform upper body dressing with assist, with/without cues (OT). Flowsheets (Taken 01/31/2021 0856) LTG: Pt will perform upper body dressing with assistance level of:  Supervision/Verbal cueing Goal: LTG Patient will perform lower body dressing w/assist (OT) Description: LTG: Patient will perform lower body dressing with assist, with/without cues in positioning using equipment (OT) Flowsheets (Taken 01/31/2021 0856) LTG: Pt will perform lower body dressing with assistance level of: Supervision/Verbal cueing   Problem: RH Toileting Goal: LTG Patient will perform toileting task (3/3 steps) with assistance level (OT) Description: LTG: Patient will perform toileting task (3/3 steps) with assistance level (OT)  Flowsheets (Taken 01/31/2021 0856) LTG: Pt will perform toileting task (3/3 steps) with assistance level: Supervision/Verbal cueing   Problem: RH Functional Use of Upper Extremity Goal: LTG Patient will use RT/LT upper extremity as a (OT) Description: LTG: Patient will use right/left upper extremity as a stabilizer/gross assist/diminished/nondominant/dominant level with assist, with/without cues during functional activity (OT) Flowsheets (Taken 01/31/2021 0856) LTG: Pt will use upper extremity in functional activity with assistance level of: Supervision/Verbal cueing   Problem: RH Simple Meal Prep Goal: LTG Patient will perform simple meal prep w/assist (OT) Description: LTG: Patient will perform simple meal prep with assistance, with/without cues (OT). Flowsheets (Taken 01/31/2021 0856) LTG: Pt will perform simple meal prep with assistance level of: Supervision/Verbal cueing   Problem: RH Toilet Transfers Goal: LTG Patient will perform toilet transfers w/assist (OT) Description: LTG: Patient will perform toilet transfers with assist, with/without cues using equipment (OT) Flowsheets (Taken 01/31/2021 0856) LTG: Pt will perform toilet transfers with assistance level of: Supervision/Verbal cueing

## 2021-01-31 NOTE — Plan of Care (Signed)
  Problem: Consults Goal: RH STROKE PATIENT EDUCATION Description: See Patient Education module for education specifics  Outcome: Progressing   Problem: RH BOWEL ELIMINATION Goal: RH STG MANAGE BOWEL WITH ASSISTANCE Description: STG Manage Bowel with mod I Assistance. Outcome: Progressing Goal: RH STG MANAGE BOWEL W/MEDICATION W/ASSISTANCE Description: STG Manage Bowel with Medication with mod I Assistance. Outcome: Progressing   Problem: RH SAFETY Goal: RH STG ADHERE TO SAFETY PRECAUTIONS W/ASSISTANCE/DEVICE Description: STG Adhere to Safety Precautions With cues/reminders Assistance/Device. Outcome: Progressing   Problem: RH KNOWLEDGE DEFICIT Goal: RH STG INCREASE KNOWLEDGE OF DIABETES Description: Patient will be able to manage DM with medications and dietary modifications using handouts and educational resources independently Outcome: Progressing Goal: RH STG INCREASE KNOWLEDGE OF HYPERTENSION Description: Patient will be able to manage HTN with medications and dietary modifications using handouts and educational resources independently Outcome: Progressing Goal: RH STG INCREASE KNOWLEGDE OF HYPERLIPIDEMIA Description: Patient will be able to manage HLD with medications and dietary modifications using handouts and educational resources independently Outcome: Progressing Goal: RH STG INCREASE KNOWLEDGE OF STROKE PROPHYLAXIS Description: Patient will be able to manage secondary stroke risks with medications and dietary modifications using handouts and educational resources independently Outcome: Progressing

## 2021-02-01 LAB — GLUCOSE, CAPILLARY
Glucose-Capillary: 139 mg/dL — ABNORMAL HIGH (ref 70–99)
Glucose-Capillary: 83 mg/dL (ref 70–99)
Glucose-Capillary: 146 mg/dL — ABNORMAL HIGH (ref 70–99)
Glucose-Capillary: 127 mg/dL — ABNORMAL HIGH (ref 70–99)

## 2021-02-01 MED ORDER — DOCUSATE SODIUM 100 MG PO CAPS
100.0000 mg | ORAL_CAPSULE | Freq: Every day | ORAL | Status: DC
  Administered 2021-02-01 – 2021-02-11 (×11): 100 mg via ORAL
  Filled 2021-02-01 (×11): qty 1

## 2021-02-01 NOTE — Progress Notes (Signed)
Physical Therapy Session Note  Patient Details  Name: Samantha Huffman MRN: [id number] Date of Birth: [birth year]  Today's Date: 02/01/2021 PT Individual Time: 9030-0923 PT Individual Time Calculation (min): 57 min   Short Term Goals: Week 1:  PT Short Term Goal 1 (Week 1): Pt will trasnfer to and from bed with CGA and LRAD PT Short Term Goal 2 (Week 1): Pt will perform gait training with LRAD and CGA >149ft PT Short Term Goal 3 (Week 1): Pt will complete Berg balance test  Skilled Therapeutic Interventions/Progress Updates:  Chart reviewed. Pt received semi-reclined in bed and agreeable to participate in PT. Session focused on functional mobility during ADLs, dynamic balance, ambulation endurance and safe use of DME.  Pt began session with CGA + RW assist to bathroom for toileting. Pt able to manage hygiene with supervision in standing. Pt returned to [facility] with CGA + RW and completed upper and lower body dressing with supervision for upper body and CGA for lower body. Pt then transported to therapy gym for time management. In therapy gym, pt completed 2 trials of ambulation at 166ft and 75 ft with CGA + RW for both straight pathways and turns, although pt required extra vc/tc for safe RW management during turns. Pt then completed 10 mins of card placement and other activities in standing with L HHA and no AD progressing to supervision only with no HHA or AD. Pt then returned to room and completed BERG with score of 25/56.  Pt was assisted in finding missing sunglasses and then left seated in recliner with alarm engaged, call bell and all needs in reach.  Therapy Documentation Precautions:  Precautions Precautions: Fall Precaution Comments: ataxia, RLE/RUE Restrictions Weight Bearing Restrictions: No   Pain: Pain Assessment Pain Scale: (P) 0-10 Pain Score: (P) 0-No pain    Therapy/Group: Individual Therapy  Marquette Saa, PT, DPT 02/01/2021, 1:59 PM

## 2021-02-01 NOTE — Progress Notes (Signed)
PROGRESS NOTE   Subjective/Complaints:  Pt awake- reports wants to go home, denies pain;  Slept well with CPAP.  LBM- 2 hard lumps- had good BM's on Acute, but not really good since came here.     ROS:  Pt denies SOB, abd pain, CP, N/V/C/D, and vision changes   Objective:   No results found. Recent Labs    01/31/21 0501  WBC 6.1  HGB 13.4  HCT 40.0  PLT 199   Recent Labs    01/31/21 0501  NA 140  K 4.7  CL 109  CO2 21*  GLUCOSE 159*  BUN 23  CREATININE 1.17*  CALCIUM 9.4    Intake/Output Summary (Last 24 hours) at 02/01/2021 1150 Last data filed at 02/01/2021 0746 Gross per 24 hour  Intake 900 ml  Output --  Net 900 ml        Physical Exam: Vital Signs Blood pressure 137/73, pulse (!) 59, temperature 98.6 F (37 C), temperature source Oral, resp. rate 20, height 5\' 1"  (1.549 m), weight 65.2 kg, SpO2 97 %.      General: awake, alert, appropriate, NAD HENT: conjugate gaze; oropharynx moist CV: regular rate; no JVD Pulmonary: CTA B/L; no W/R/R- good air movement GI: soft, NT, ND, (+)BS; hypoactive Psychiatric: appropriate; interactive; homesick Neurological: alert Musculoskeletal:        General: No swelling or tenderness. Normal range of motion.    Cervical back: Normal range of motion. Skin:    General: Skin is warm and dry. Neurological:    Mental Status: She is alert and oriented to person, place, and time.    Comments: Speech slow and measured. Mild left facial weakness with mild dysarthria. She is able to follow simple commands without difficulty. A little slow to process.  Right sided weakness with ataxia noted in both RUE and RLE. 4/5 RUE and 5/5 LUE. RLE 4/5, LLE 5/5.    Assessment/Plan: 1. Functional deficits which require 3+ hours per day of interdisciplinary therapy in a comprehensive inpatient rehab setting. Physiatrist is providing close team supervision and 24 hour  management of active medical problems listed below. Physiatrist and rehab team continue to assess barriers to discharge/monitor patient progress toward functional and medical goals  Care Tool:  Bathing    Body parts bathed by patient: Left arm, Chest, Abdomen, Right arm, Left upper leg, Right upper leg, Front perineal area, Buttocks, Right lower leg, Left lower leg, Face         Bathing assist Assist Level: Minimal Assistance - Patient > 75%     Upper Body Dressing/Undressing Upper body dressing   What is the patient wearing?: Bra, Pull over shirt    Upper body assist Assist Level: Supervision/Verbal cueing    Lower Body Dressing/Undressing Lower body dressing      What is the patient wearing?: Underwear/pull up, Pants     Lower body assist Assist for lower body dressing: Minimal Assistance - Patient > 75%     Toileting Toileting    Toileting assist Assist for toileting: Contact Guard/Touching assist     Transfers Chair/bed transfer  Transfers assist     Chair/bed transfer assist level: Minimal Assistance -  Patient > 75%     Locomotion Ambulation   Ambulation assist      Assist level: Moderate Assistance - Patient 50 - 74% Assistive device: Hand held assist Max distance: 20   Walk 10 feet activity   Assist     Assist level: Moderate Assistance - Patient - 50 - 74% Assistive device: Hand held assist   Walk 50 feet activity   Assist Walk 50 feet with 2 turns activity did not occur: Safety/medical concerns         Walk 150 feet activity   Assist Walk 150 feet activity did not occur: Safety/medical concerns         Walk 10 feet on uneven surface  activity   Assist Walk 10 feet on uneven surfaces activity did not occur: Safety/medical concerns         Wheelchair     Assist        Wheelchair assist level: Minimal Assistance - Patient > 75% Max wheelchair distance: 150    Wheelchair 50 feet with 2 turns  activity    Assist        Assist Level: Minimal Assistance - Patient > 75%   Wheelchair 150 feet activity     Assist      Assist Level: Minimal Assistance - Patient > 75%   Blood pressure 137/73, pulse (!) 59, temperature 98.6 F (37 C), temperature source Oral, resp. rate 20, height 5\' 1"  (1.549 m), weight 65.2 kg, SpO2 97 %.  Medical Problem List and Plan: 1.  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  Antithrombotics: -DVT/anticoagulation:  Pharmaceutical: Lovenox             -antiplatelet therapy: ASA/plavix 3. Pain Management: Tylenol prn for HA.   6/12- denies pain- con't regimen prn 4. Mood: LCSW to follow for evaluation and support.              -antipsychotic agents: N/A 5. Neuropsych: This patient is capable of making decisions on her own behalf. 6. Skin/Wound Care: Routine pressure relief measures.  7. Fluids/Electrolytes/Nutrition:  Monitor I/O. Check CMET in am.  8. HTN: Monitor TID             --continue Lisinopril, coreg and Imdur.  6/11- BP's con't -con't regimen             -monitor readings with therapy 9. T2DM: Hgb A1c-7.6.  Monitor BS ac/hs and use SSI for elevated BS.                  --resume home dose metformin.             -adjust regimen as needed             -diabetic education  6/11- Pt's BG's 107-199- only 1 >160- con't regimen and monitor for trend.   6/12- BG's 116-155- con't regimen 10 Seizure disorder: Stable on Keppra bid. 11. Hyperlipidemia: On Statin and Zetia.              --was on Praluent twice a month.  12. Recent bilateral cataract surgery: Completes eye drops for right eye in a week.  39. CAD s/p CABG 2014 : Monitor  for symptoms with increase in activity             --On DAPT, Imdur, Lisinopril, coreg and statin  (Praluent on hold) 14. Anxiety d/o: Mood stable on Lexapro. Not using Xanax currently. 15.  OSA: Is compliant with CPAP use.  6/11- using CPAP this AM.  16. Constipation  6/12- stools hard- is main issue- will add Colace and maintain Miralax.       LOS: 2 days A FACE TO FACE EVALUATION WAS PERFORMED  Haley Fuerstenberg 02/01/2021, 11:50 AM

## 2021-02-01 NOTE — Progress Notes (Signed)
RT set up Pt's home CPAP and filled with sterile water and placed on bedside table. Pt placed herself on CPAP at this time and is tolerating settings well. RT will monitor as needed.

## 2021-02-02 LAB — BASIC METABOLIC PANEL
Anion gap: 7 (ref 5–15)
BUN: 22 mg/dL (ref 8–23)
CO2: 23 mmol/L (ref 22–32)
Calcium: 9.5 mg/dL (ref 8.9–10.3)
Chloride: 107 mmol/L (ref 98–111)
Creatinine, Ser: 1.11 mg/dL — ABNORMAL HIGH (ref 0.44–1.00)
GFR, Estimated: 56 mL/min — ABNORMAL LOW (ref >60)
Glucose, Bld: 140 mg/dL — ABNORMAL HIGH (ref 70–99)
Potassium: 4.2 mmol/L (ref 3.5–5.1)
Sodium: 137 mmol/L (ref 135–145)

## 2021-02-02 LAB — GLUCOSE, CAPILLARY
Glucose-Capillary: 129 mg/dL — ABNORMAL HIGH (ref 70–99)
Glucose-Capillary: 132 mg/dL — ABNORMAL HIGH (ref 70–99)
Glucose-Capillary: 102 mg/dL — ABNORMAL HIGH (ref 70–99)

## 2021-02-02 LAB — CBC
HCT: 40.7 % (ref 36.0–46.0)
Hemoglobin: 13.8 g/dL (ref 12.0–15.0)
MCH: 32.2 pg (ref 26.0–34.0)
MCHC: 33.9 g/dL (ref 30.0–36.0)
MCV: 94.9 fL (ref 80.0–100.0)
Platelets: 218 10*3/uL (ref 150–400)
RBC: 4.29 MIL/uL (ref 3.87–5.11)
RDW: 12.5 % (ref 11.5–15.5)
WBC: 6.5 10*3/uL (ref 4.0–10.5)
nRBC: 0 % (ref 0.0–0.2)

## 2021-02-02 NOTE — Progress Notes (Signed)
Inpatient Rehabilitation Care Coordinator Assessment and Plan Patient Details  Name: Samantha Huffman MRN: [id number] Date of Birth: 1956/08/04  Today's Date: 02/02/2021  Hospital Problems: Principal Problem:   Cerebellar stroke [facility])  Past Medical History:  Past Medical History:  Diagnosis Date   Carotid artery stenosis    1-39% bilateral stenosis by dopplers 08/2020   Coronary artery disease    a. s/p CABG 10/2012.  cath 05/2016 with moderate LM stenosis, mild LCx and RCA stenosis and occluded LAD with patent LIMA to LAD, free radial to OM.  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   Dyslipidemia    Family history of adverse reaction to anesthesia    mother and sister ponv   Generalized convulsive epilepsy without mention of intractable epilepsy    GERD (gastroesophageal reflux disease)    History of kidney stones    Hypertension    Memory deficit    slight   Migraine    "controlled on daily RX " (06/11/2016)   Nephrolithiasis 07/30/2015   S/p lithotripsy x 3 and right and left ureter stents   OSA (obstructive sleep apnea)    cpap    PONV (postoperative nausea and vomiting)    Seizures ([place])    "controlled w/daily RX; started in 2012; dr thinks they might be from the migraines; last sz was early part of 2016" (06/11/2016)   Type II diabetes mellitus ([place])    metformin   Vasovagal syncope 07/30/2015   2011   Past Surgical History:  Past Surgical History:  Procedure Laterality Date   CARDIAC CATHETERIZATION  2014; 06/11/2016   CARDIAC CATHETERIZATION N/A 06/11/2016   Procedure: Left Heart Cath and Cors/Grafts Angiography;  Surgeon: Sherren Mocha, MD;  Location: [facility];  Service: Cardiovascular;  Laterality: N/A;   CESAREAN SECTION     CORONARY ANGIOPLASTY     CORONARY ARTERY BYPASS GRAFT  March, 2014   LIMA to LAD, left radial to LCx x 2   CYSTOSCOPY W/ URETERAL STENT PLACEMENT Left 01/16/2019   Procedure: CYSTOSCOPY WITH RETROGRADE PYELOGRAM/URETERAL LEFT  STENT PLACEMENT;  Surgeon: Ardis Hughs, MD;  Location: [facility];  Service: Urology;  Laterality: Left;   CYSTOSCOPY/URETEROSCOPY/HOLMIUM LASER/STENT PLACEMENT Left 01/24/2019   Procedure: CYSTOSCOPY, LEFT URETEROSCOPY, HOLMIUM LASER, STONE REMOVAL AND STENT EXCHANGE;  Surgeon: Ardis Hughs, MD;  Location: [facility];  Service: Urology;  Laterality: Left;   LITHOTRIPSY  X 3   RIGHT/LEFT HEART CATH AND CORONARY/GRAFT ANGIOGRAPHY N/A 09/08/2018   Procedure: RIGHT/LEFT HEART CATH AND CORONARY/GRAFT ANGIOGRAPHY;  Surgeon: Martinique, Peter M, MD;  Location: [place] [facility];  Service: Cardiovascular;  Laterality: N/A;   TOTAL ABDOMINAL HYSTERECTOMY     ovaries took before hysterectomy   TUBAL LIGATION     URETERAL STENT PLACEMENT     Social History:  reports that she has been smoking cigarettes. She has a 11.55 pack-year smoking history. She quit smokeless tobacco use about 8 years ago.  Her smokeless tobacco use included snuff and chew. She reports current alcohol use. She reports that she does not use drugs.  Family / Support Systems Children: Tilda Franco (Daughter) Anticipated Caregiver: Tilda Franco Ability/Limitations of Caregiver: Works from home Caregiver Availability: 24/7 Family Dynamics: pt has support of two daughters  Social History Preferred language: English Religion: Baptist Read: Yes Write: Yes Legal History/Current Legal Issues: n/a Guardian/Conservator: Tilda Franco   Abuse/Neglect Abuse/Neglect Assessment Can Be Completed: Yes Physical Abuse: Denies Verbal Abuse: Denies Sexual Abuse: Denies Exploitation of patient/patient's  resources: Denies Self-Neglect: Denies  Emotional Status Recent Psychosocial Issues: n/a Psychiatric History: n/a Substance Abuse History: n/a  Patient / Family Perceptions, Expectations & Goals Pt/Family understanding of illness & functional limitations: yes Premorbid pt/family roles/activities: Independent previously Anticipated  changes in roles/activities/participation: Daughter able to assist with task and care Pt/family expectations/goals: Supervision to [place]: None Premorbid Home Care/DME Agencies: None Transportation available at discharge: family able to transport  Discharge Planning Living Arrangements: Alone Support Systems: Children Type of Residence: Private residence (1 level home, level entry) Administrator, sports: Multimedia programmer (specify) Doctor, general practice) Museum/gallery curator Resources: Radio broadcast assistant Screen Referred: No Living Expenses: Education officer, community Management: Patient Does the patient have any problems obtaining your medications?: No Home Management: Independent Patient/Family Preliminary Plans: Daughter able to assist Care Coordinator Barriers to Discharge: Insurance for SNF coverage Care Coordinator Anticipated Follow Up Needs: HH/OP Expected length of stay: 10-14 Days  Clinical Impression SW met with patient, introduced self and explained role. Pt reports both of her daughters visit (contact information left in patient room). Patient will discharge home with daughter [name] ) to provide care, [facility]. 1 level home, level entry. No questions and concerns, sw will follow up with patient and family.   Dyanne Iha 02/02/2021, 12:37 PM

## 2021-02-02 NOTE — Progress Notes (Signed)
Physical Therapy Session Note  Patient Details  Name: Samantha Huffman MRN: [id number] Date of Birth: 1956/06/18  Today's Date: 02/02/2021 PT Individual Time:  1301-1415  PT Individual Time Calculation (min): 74 min   Short Term Goals: Week 1:  PT Short Term Goal 1 (Week 1): Pt will trasnfer to and from bed with CGA and LRAD PT Short Term Goal 2 (Week 1): Pt will perform gait training with LRAD and CGA >135ft PT Short Term Goal 3 (Week 1): Pt will complete Berg balance test  Skilled Therapeutic Interventions/Progress Updates:  Patient seated in recliner with BLE elevated on entrance to room. Patient alert and agreeable to PT session. Patient denied pain during session.  Therapeutic Activity: Bed Mobility: Seated in recliner, pt is able to maintain balance and don shoes/ socks with supervision.   At end of session pt performed sit-->supine with supervision. VC/ tc required for effort and positioning. She is able to perform bridging to doff pants and move from sidelying to longsitting in order to doff UB clothing in order to don hospital gown. Doffing clothes performed with supervision and slight ataxia seen in performance of bridging.  Transfers: Patient performed STS throughout session improving from CGA to supervision. SPVT transfers performed throughout session with CGA. Provided verbal cues for hand placement and technique. Toilet transfer performed with CGA and slight freezing of movement during pivot stepping with RLE, no LOB. Completes clothing mgmt and pericare with supervision.  Gait Training:  Patient ambulated 225' x2 feet using RW with CGA. Demonstrated slight ataxic movements at start of session and with fatigue. Provided vc/ tc at start of session for step height/ length and heel-to-toe progression with pt able to maintain throughout with minimal reminder cueing.  Neuromuscular Re-ed: NMR facilitated during session with focus on standing balance and coordinated movement with RUE  and RLE. Pt guided in standing static and dynamic reaching to match bean bag to same color dot on tray table placed far outside of BOS requiring dynamic step to reach. Able to follow instructions for step with L/R and reach with RUE throughout placement and retrieval. Some ataxic movement noted RUE>RLE but especially with stepping forward with RLE and reach with RUE together. Smooth movement performed with no AD and 2# weighted ball in pot stirrers with large, smooth movements in CW/ CCW directions x5 each. Is able to maintain balance with dropped ball into outstretched hands as well as with dropping ball demonstrating good, but over-reactive  response to reactive balance. Also performed toe touches to cone in differing placements with good, smooth movement pattern and hitting target with 100% accuracy. NMR performed for improvements in motor control and coordination, balance, sequencing, judgement, and self confidence/ efficacy in performing all aspects of mobility at highest level of independence.   Patient supine in bed at end of session with brakes locked, bed alarm set, and all needs within reach.     Therapy Documentation Precautions:  Precautions Precautions: Fall Precaution Comments: ataxia, RLE/RUE Restrictions Weight Bearing Restrictions: No  Therapy/Group: Individual Therapy  Alger Simons PT, DPT 02/02/2021, 1:02 PM

## 2021-02-02 NOTE — Progress Notes (Addendum)
RT filled pt. Water chamber. Pt. Placed cpap on. No issues at this time.

## 2021-02-02 NOTE — Progress Notes (Addendum)
PROGRESS NOTE   Subjective/Complaints:  No issues overnite  ROS:  Pt denies SOB, abd pain, CP, N/V/C/D, and vision changes   Objective:   No results found. Recent Labs    01/31/21 0501 02/02/21 0619  WBC 6.1 6.5  HGB 13.4 13.8  HCT 40.0 40.7  PLT 199 218    Recent Labs    01/31/21 0501 02/02/21 0619  NA 140 137  K 4.7 4.2  CL 109 107  CO2 21* 23  GLUCOSE 159* 140*  BUN 23 22  CREATININE 1.17* 1.11*  CALCIUM 9.4 9.5     Intake/Output Summary (Last 24 hours) at 02/02/2021 0919 Last data filed at 02/02/2021 0700 Gross per 24 hour  Intake 650 ml  Output --  Net 650 ml         Physical Exam: Vital Signs Blood pressure (!) 143/76, pulse 65, temperature 97.7 F (36.5 C), temperature source Oral, resp. rate 19, height 5\' 1"  (1.549 m), weight 65.2 kg, SpO2 100 %.   General: No acute distress Mood and affect are appropriate Heart: Regular rate and rhythm no rubs murmurs or extra sounds Lungs: Clear to auscultation, breathing unlabored, no rales or wheezes Abdomen: Positive bowel sounds, soft nontender to palpation, nondistended Extremities: No clubbing, cyanosis, or edema Skin: No evidence of breakdown, no evidence of rash   Musculoskeletal:        General: No swelling or tenderness. Normal range of motion.    Cervical back: Normal range of motion. Skin:    General: Skin is warm and dry. Neurological:    Mental Status: She is alert and oriented to person, place, and time.    Comments: Speech slow and measured. Mild left facial weakness with mild dysarthria. She is able to follow simple commands without difficulty. A little slow to process.  Right sided weakness with ataxia noted in both RUE and RLE. 4/5 RUE and 5/5 LUE. RLE 4/5, LLE 5/5.    Assessment/Plan: 1. Functional deficits which require 3+ hours per day of interdisciplinary therapy in a comprehensive inpatient rehab setting. Physiatrist is  providing close team supervision and 24 hour management of active medical problems listed below. Physiatrist and rehab team continue to assess barriers to discharge/monitor patient progress toward functional and medical goals  Care Tool:  Bathing    Body parts bathed by patient: Left arm, Chest, Abdomen, Right arm, Left upper leg, Right upper leg, Front perineal area, Buttocks, Right lower leg, Left lower leg, Face         Bathing assist Assist Level: Contact Guard/Touching assist     Upper Body Dressing/Undressing Upper body dressing   What is the patient wearing?: Bra, Pull over shirt    Upper body assist Assist Level: Set up assist    Lower Body Dressing/Undressing Lower body dressing      What is the patient wearing?: Underwear/pull up, Pants     Lower body assist Assist for lower body dressing: Supervision/Verbal cueing     Toileting Toileting    Toileting assist Assist for toileting: Contact Guard/Touching assist     Transfers Chair/bed transfer  Transfers assist     Chair/bed transfer assist level: Minimal Assistance -  Patient > 75%     Locomotion Ambulation   Ambulation assist      Assist level: Moderate Assistance - Patient 50 - 74% Assistive device: Hand held assist Max distance: 20   Walk 10 feet activity   Assist     Assist level: Moderate Assistance - Patient - 50 - 74% Assistive device: Hand held assist   Walk 50 feet activity   Assist Walk 50 feet with 2 turns activity did not occur: Safety/medical concerns         Walk 150 feet activity   Assist Walk 150 feet activity did not occur: Safety/medical concerns         Walk 10 feet on uneven surface  activity   Assist Walk 10 feet on uneven surfaces activity did not occur: Safety/medical concerns         Wheelchair     Assist        Wheelchair assist level: Minimal Assistance - Patient > 75% Max wheelchair distance: 150    Wheelchair 50 feet with 2  turns activity    Assist        Assist Level: Minimal Assistance - Patient > 75%   Wheelchair 150 feet activity     Assist      Assist Level: Minimal Assistance - Patient > 75%   Blood pressure (!) 143/76, pulse 65, temperature 97.7 F (36.5 C), temperature source Oral, resp. rate 19, height 5\' 1"  (1.549 m), weight 65.2 kg, SpO2 100 %.  Medical Problem List and Plan: 1.  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  Antithrombotics: -DVT/anticoagulation:  Pharmaceutical: Lovenox             -antiplatelet therapy: ASA/plavix 3. Pain Management: Tylenol prn for HA.   6/12- denies pain- con't regimen prn 4. Mood: LCSW to follow for evaluation and support.              -antipsychotic agents: N/A 5. Neuropsych: This patient is capable of making decisions on her own behalf. 6. Skin/Wound Care: Routine pressure relief measures.  7. Fluids/Electrolytes/Nutrition:  Monitor I/O. Check CMET in am.  8. HTN: Monitor TID             --continue Lisinopril, coreg and Imdur.  6/13- controlled              Vitals:   02/01/21 2050 02/02/21 0528  BP:  (!) 143/76  Pulse: 72 65  Resp: 16 19  Temp:  97.7 F (36.5 C)  SpO2: 98% 100%    9. T2DM: Hgb A1c-7.6.  Monitor BS ac/hs and use SSI for elevated BS.                  --resume home dose metformin.             -adjust regimen as needed             -diabetic education  CBG (last 3)  Recent Labs    02/01/21 1707 02/01/21 2128 02/02/21 0618  GLUCAP 146* 127* 129*  Controlled 6/13  10 Seizure disorder: Stable on Keppra bid. 11. Hyperlipidemia: On Statin and Zetia.              --  was on Praluent twice a month.  12. Recent bilateral cataract surgery: Completes eye drops for right eye in a week.  66. CAD s/p CABG 2014 :  Monitor for symptoms with increase in activity             --On DAPT, Imdur, Lisinopril, coreg and statin (Praluent on hold) 14. Anxiety d/o: Mood stable on Lexapro. Not using Xanax currently. 15.  OSA: Is compliant with CPAP use.  6/11- using CPAP this AM.  16. Constipation  6/12- stools hard- is main issue- will add Colace and maintain Miralax.       LOS: 3 days A FACE TO FACE EVALUATION WAS PERFORMED  Charlett Blake 02/02/2021, 9:19 AM

## 2021-02-02 NOTE — IPOC Note (Signed)
Overall Plan of Care [facility]) Patient Details Name: Samantha Huffman MRN: [id number] DOB: 11-Mar-1956  Admitting Diagnosis: Cerebellar stroke [facility])  Hospital Problems: Principal Problem:   Cerebellar stroke [facility])     Functional Problem List: Nursing Medication Management, Safety, Endurance, Bowel  PT Balance, Behavior, Endurance, Motor, Perception, Safety  OT Balance, Endurance, Perception, Vision, Sensory, Motor, Safety  SLP    TR         Basic ADL's: OT Eating, Grooming, Bathing, Dressing, Toileting     Advanced  ADL's: OT Simple Meal Preparation     Transfers: PT Bed Mobility, Bed to Chair, Car, [employer], Floor  OT Toilet, Tub/Shower     Locomotion: PT Ambulation, Emergency planning/management officer, Stairs     Additional Impairments: OT Fuctional Use of Upper Extremity  SLP        TR      Anticipated Outcomes Item Anticipated Outcome  Self Feeding mod I  Swallowing      Basic self-care  S  Toileting  S   Bathroom Transfers S  Bowel/Bladder  manage bowel with mod I assist  Transfers  Supervision assist with LRAD  Locomotion  AMbulatory with LRAD at supervisoin assist level.  Communication     Cognition     Pain  n/a  Safety/Judgment  maintain safety wtih cues/reminders   Therapy Plan: PT Intensity: Minimum of 1-2 x/day ,45 to 90 minutes PT Frequency: 5 out of 7 days PT Duration Estimated Length of Stay: 10-14 days OT Intensity: Minimum of 1-2 x/day, 45 to 90 minutes OT Frequency: 5 out of 7 days OT Duration/Estimated Length of Stay: 9 to 11 days     Due to the current state of emergency, patients may not be receiving their 3-hours of Medicare-mandated therapy.   Team Interventions: Nursing Interventions Disease Management/Prevention, Medication Management, Pain Management, Bowel Management, Discharge Planning  PT interventions Ambulation/gait training, Cognitive remediation/compensation, Training and development officer, Community reintegration, Discharge  planning, Disease management/prevention, DME/adaptive equipment instruction, Functional electrical stimulation, Functional mobility training, Neuromuscular re-education, Pain management, Patient/family education, Psychosocial support, Skin care/wound management, Splinting/orthotics, Stair training, Therapeutic Activities, Therapeutic Exercise, UE/LE Strength taining/ROM, UE/LE Coordination activities, Visual/perceptual remediation/compensation, Wheelchair propulsion/positioning  OT Interventions Training and development officer, Community reintegration, Disease mangement/prevention, Technical sales engineer stimulation, Neuromuscular re-education, Patient/family education, Self Care/advanced ADL retraining, Splinting/orthotics, Therapeutic Exercise, UE/LE Coordination activities, Wheelchair propulsion/positioning, Visual/perceptual remediation/compensation, UE/LE Strength taining/ROM, Therapeutic Activities, Skin care/wound managment, Psychosocial support, Pain management, Functional mobility training, DME/adaptive equipment instruction, Discharge planning  SLP Interventions    TR Interventions    SW/CM Interventions Discharge Planning, Psychosocial Support, Patient/Family Education   Barriers to Discharge MD  Medical stability  Nursing Decreased caregiver support, Home environment access/layout level apt solo; daughter available and works from home  PT Decreased caregiver support, Home environment Child psychotherapist, Insurance underwriter for SNF coverage, Medication compliance    OT      SLP      SW       Team Discharge Planning: Destination: PT-Home ,OT- Home , SLP-  Projected Follow-up: PT-Home health PT, OT-  Outpatient OT, 24 hour supervision/assistance, SLP-  Projected Equipment Needs: PT- , OT- To be determined, SLP-  Equipment Details: PT- , OT-  Patient/family involved in discharge planning: PT- Patient,  OT-Patient, SLP-   MD ELOS: 10-14d Medical Rehab Prognosis:  Excellent Assessment:  65 year old  RH-female with history of CAD, HTN, T2DM, OSA, renal calculi, seizure d/o; who was admitted on 01/20/21 with onset of weakness right side with unsteady gait for 2-3 days. She reported having  lifted a case of water the day before and started feeling dizzy. She ws found to have occlusion of R-SCA and acute/subacute right cerebellar stroke with small infarcts in right PICA and left thalamus. 2 D echo showed EF 60-65% with trivial MVR. DR. Xu questioned vertebral dissection as cause of stroke and repeat CT head in 2 days to rule out hydrocephalus. ASA increased to 325 mg with recommendations of DAPT X 3 months followed by lower dose ASA/Plavix as per home regimen.  Follow up CT head 06/3 and 06/05 showed stable right cerebellar and thalamic infarcts with out hemorrhage or mass effect. She continues to have deficits due to right sided weakness with ataxia & sensory deficits, dizziness as well as visual deficits affecting ADLs and mobility. CIR recommended due to functional decline.     See Team Conference Notes for weekly updates to the plan of care

## 2021-02-02 NOTE — Progress Notes (Signed)
Patient slept well throughout the night. No c/o pain or discomfort. CPAP in use.

## 2021-02-02 NOTE — Progress Notes (Signed)
Occupational Therapy Session Note  Patient Details  Name: Samantha Huffman MRN: [id number] Date of Birth: 12-May-1956  Today's Date: 02/02/2021 OT Individual Time: 7622-6333 OT Individual Time Calculation (min): 76 min    Short Term Goals: Week 1:  OT Short Term Goal 1 (Week 1): Pt will complete either walk-in or tub/shower transfer with min A + LRAD. OT Short Term Goal 2 (Week 1): Pt will maintain static standing balance for at least 5 min with close in prep for standing ADL. OT Short Term Goal 3 (Week 1): Pt will ind state 2 energy conservation strategies to incoporate into her daily routine upon DC. OT Short Term Goal 4 (Week 1): Pt will bathe full-body with CGA.  Skilled Therapeutic Interventions/Progress Updates:    Pt received in bed and consented to OT tx. Session focused on morning ADL routine this day including bathing, toileting, dressing, grooming, and functional transfer training. Pt req CGA for toileting and SPTs to toilet from w/c and completed all clothing mgmt and hygiene with CGA. Pt req CGA for shower transfer, bathed with distant supervision while seated on bench in shower. Pt req increased time for thoroughness. After shower, pt taken to sink via w/c and completed oral care, grooming, and dressing with increased time and SUP.  Pt wheeled down to therapy gym and instructed in pink theraputty HEP with bead retrieval to increase intrinsic hand strength and [place] for ADLs, instructed to focus on [facility] in R hand. After tx, pt taken back to room, walked from doorway to recliner with RW and CGA, and left in recliner with alarm on and all needs met.   Therapy Documentation Precautions:  Precautions Precautions: Fall Precaution Comments: ataxia, RLE/RUE Restrictions Weight Bearing Restrictions: No   Vital Signs: Therapy Vitals Temp: 97.7 F (36.5 C) Temp Source: Oral Pulse Rate: 65 Resp: 19 BP: (!) 143/76 Patient Position (if appropriate): Lying Oxygen Therapy SpO2: 100 % Pain:  none      Therapy/Group: Individual Therapy  Dreydon Cardenas 02/02/2021, 8:14 AM

## 2021-02-02 NOTE — Progress Notes (Signed)
Occupational Therapy Session Note  Patient Details  Name: Samantha Huffman MRN: [id number] Date of Birth: 09-02-1955  Today's Date: 02/02/2021 OT Individual Time: 7190-7072 OT Individual Time Calculation (min): 41 min    Short Term Goals: Week 1:  OT Short Term Goal 1 (Week 1): Pt will complete either walk-in or tub/shower transfer with min A + LRAD. OT Short Term Goal 2 (Week 1): Pt will maintain static standing balance for at least 5 min with close in prep for standing ADL. OT Short Term Goal 3 (Week 1): Pt will ind state 2 energy conservation strategies to incoporate into her daily routine upon DC. OT Short Term Goal 4 (Week 1): Pt will bathe full-body with CGA.  Skilled Therapeutic Interventions/Progress Updates:    Pt received in bed with all lights off and napping. Pt reports she thought she was done with therapy for the day, but agreeable to in-room treatment session. Pt instructed to sit EOB with SUP, instructed in BUE strengthening HEP with issued orange theraband to increase strength and activity tolerance for ADLs. Trained in elbow flexion, chest press, elbow extension all for 3x15 with min cuing for proper technique with good carryover. Pt is very slow moving and requires increased time for exercises. Pt then instructed in toileting tasks and transfers, walked from EOB into bathroom with CGA and RW, completed all aspects of toileting with SUP and cuing for steps. Afterward, pt instructed to come out to sink to wash hands, instructed in reaching for items outside of BOS for improved balance. RUE ataxia noted, pt encouraged to take her time when using R hand for tasks. After tx, pt helped back to bed and left with alarm on and with all needs met.   Therapy Documentation Precautions:  Precautions Precautions: Fall Precaution Comments: ataxia, RLE/RUE Restrictions Weight Bearing Restrictions: No General:   Vital Signs: Therapy Vitals Temp: 97.9 F (36.6 C) Pulse Rate: 71 Resp:  18 BP: 120/65 Patient Position (if appropriate): Lying Oxygen Therapy SpO2: 99 % O2 Device: Room Air Pain: Pain Assessment Pain Scale: 0-10 Pain Score: 0-No pain    Therapy/Group: Individual Therapy  Samantha Huffman 02/02/2021, 3:21 PM

## 2021-02-02 NOTE — Progress Notes (Signed)
[facility] Individual Statement of Services  Patient Name:  Samantha Huffman  Date:  02/02/2021  Welcome to the [facility].  Our goal is to provide you with an individualized program based on your diagnosis and situation, designed to meet your specific needs.  With this comprehensive rehabilitation program, you will be expected to participate in at least 3 hours of rehabilitation therapies Monday-Friday, with modified therapy programming on the weekends.  Your rehabilitation program will include the following services:  Physical Therapy (PT), Occupational Therapy (OT), Speech Therapy (ST), 24 hour per day rehabilitation nursing, Therapeutic Recreaction (TR), Neuropsychology, Care Coordinator, Rehabilitation Medicine, Nutrition Services, Pharmacy Services, and Other  Weekly team conferences will be held on Wednesdays to discuss your progress.  Your Inpatient Rehabilitation Care Coordinator will talk with you frequently to get your input and to update you on team discussions.  Team conferences with you and your family in attendance may also be held.  Expected length of stay: 10-14 Days  Overall anticipated outcome: Supervision to Min A  Depending on your progress and recovery, your program may change. Your Inpatient Rehabilitation Care Coordinator will coordinate services and will keep you informed of any changes. Your Inpatient Rehabilitation Care Coordinator's name and contact numbers are listed  below.  The following services may also be recommended but are not provided by the [facility]:   [place] will be made to provide these services after discharge if needed.  Arrangements include referral to agencies that provide these services.  Your insurance has been verified to be:  [employer] Your primary doctor is:  Alroy Dust, L. Marlou Sa, MD  Pertinent  information will be shared with your doctor and your insurance company.  Inpatient Rehabilitation Care Coordinator:  Erlene Quan, [place] or [phone]  Information discussed with and copy given to patient by: Dyanne Iha, 02/02/2021, 10:49 AM

## 2021-02-03 LAB — GLUCOSE, CAPILLARY
Glucose-Capillary: 112 mg/dL — ABNORMAL HIGH (ref 70–99)
Glucose-Capillary: 116 mg/dL — ABNORMAL HIGH (ref 70–99)
Glucose-Capillary: 128 mg/dL — ABNORMAL HIGH (ref 70–99)
Glucose-Capillary: 94 mg/dL (ref 70–99)
Glucose-Capillary: 107 mg/dL — ABNORMAL HIGH (ref 70–99)

## 2021-02-03 NOTE — Progress Notes (Signed)
Physical Therapy Session Note  Patient Details  Name: Samantha Huffman MRN: [id number] Date of Birth: 02-11-1956  Today's Date: 02/03/2021 PT Individual Time: 1583-0940 and 1446-1531 PT Individual Time Calculation (min): 73 min and 45 min  Short Term Goals: Week 1:  PT Short Term Goal 1 (Week 1): Pt will trasnfer to and from bed with CGA and LRAD PT Short Term Goal 2 (Week 1): Pt will perform gait training with LRAD and CGA >136ft PT Short Term Goal 3 (Week 1): Pt will complete Berg balance test  Skilled Therapeutic Interventions/Progress Updates:  Session 1: Patient seated upright in recliner with BLE elevated on entrance to room. Patient alert and agreeable to PT session. She is able to don socks and shoes with supervision. Patient denied pain during session.  Therapeutic Activity: Transfers: Patient performed STS transfers with supervision to RW and SPVT transfers using RW with CGA throughout session. Toilet transfer completed using RW and CGA. Momentary freezing noted in pivot stepping with RLE.  Pt able to manage clothing and pericare with supervision. Provided verbal cues for taking time and follow through.  Gait Training:  Patient ambulated outdoors over uneven paved surfaces for 150' x1 and 300' x1 with transition from outdoor to indoor surface. Demonstrated good control of RW over transition of surfaces and carpeting. She also managed speed and RW well while ambulating down and back up ramped entryway to hospital. Good R foot clearance ascending. Provided vc/ tc to prepare for all changes in terrain.  Patient seated in recliner at end of session with brakes locked, seat alarm set, and all needs within reach.    Session 2: Patient seated upright in recliner with BLE elevated on entrance to room. Patient alert and agreeable to PT session. Patient denied pain during session.  Therapeutic Activity: Bed Mobility: Patient performed sit--> supine with supervision. VC/ tc required for  decreasing effort with increase of ataxic movement of RUE when reaching for bedrail. Pt instructed to relax and try again with improved motor control and ability to reach handhold.  Transfers: Patient performed STS and SPVT transfers with CGA. Provided verbal cues for pushing with BUE from seated surface and increasing forward lean.   Gait Training:  Patient ambulated 120' x2 feet using RW with CGA. Demonstrated intermittent flat foot strike and decreased step height/ length on RLE requiring vc/ tc to correct and improve heel-to-toe step progression.  Neuromuscular Re-ed: NMR facilitated during session with focus on standing balance. Pt guided in standing on [employer] with no UE support and progressing to large arm movements and potstirrers using 2# weighted bar. Also guided in standing on red foam wedge progressing to no UE support. On both pliant surfaces, pt requires time for ankle strategies to progress from overcorrecting to maintaining balance. Pt very focused d/t fear of falling despite tactile feedback re: balance and safety. NMR performed for improvements in motor control and coordination, balance, sequencing, judgement, and self confidence/ efficacy in performing all aspects of mobility at highest level of independence.   Patient supine in bed at end of session with brakes locked, bed alarm set, and all needs within reach.   Therapy Documentation Precautions:  Precautions Precautions: Fall Precaution Comments: ataxia, RLE/RUE Restrictions Weight Bearing Restrictions: No  Therapy/Group: Individual Therapy  Alger Simons PT, DPT 02/03/2021, 7:58 AM

## 2021-02-03 NOTE — Progress Notes (Signed)
Patient is requesting her eye drops be entered into the [facility] because she is extremely frustrated having to call for her eye drops. Writer explained the eye drops are under nursing instructions, which doesn't generate a physical prompt as a reminder. Writer stated they would leave note so provider could see in the morning and possibly put eye drops into [facility] so patient will no longer have to call.

## 2021-02-03 NOTE — Progress Notes (Signed)
Occupational Therapy Session Note  Patient Details  Name: Samantha Huffman MRN: [id number] Date of Birth: 1955-12-09  Today's Date: 02/03/2021 OT Individual Time: 3149-7026 OT Individual Time Calculation (min): 58 min    Short Term Goals: Week 1:  OT Short Term Goal 1 (Week 1): Pt will complete either walk-in or tub/shower transfer with min A + LRAD. OT Short Term Goal 2 (Week 1): Pt will maintain static standing balance for at least 5 min with close in prep for standing ADL. OT Short Term Goal 3 (Week 1): Pt will ind state 2 energy conservation strategies to incoporate into her daily routine upon DC. OT Short Term Goal 4 (Week 1): Pt will bathe full-body with CGA.  Skilled Therapeutic Interventions/Progress Updates:    Pt received in bed and consented to OT tx. Session focused on ADL retraining including toileting, bathing, dressing, and grooming. Pt able to walk with RW into bathroom for toilet and shower transfers with CGA. Pt demo'd 1 minor LOB during shower transfer with therapist CGA and grab bars to recover. Pt completed all toileting tasks with close SUP, able to bathe with close SUP sitting and standing in shower and cues for proficiency. After shower, pt walked into bedroom with RW and CGA to complete dressing and grooming routine while seated in recliner with setup and SUP and increased time. Pt requires cuing to stay inside RW during all functional mobility at close SUP-CGA level. Pt used RW to stand with SUP for LB clothing mgmt. Pt completed oral hygiene routine standing sink side with SUP. Pt demo's improvement with RUE ataxic movements, however occasionally drops smaller ADL items (soap in shower, toothpaste, etc.). After ADL routine, remaining time during session utilized to instruct pt in pink theraputty HEP to increase strength and [place] in R hand for increased ALD independence. After tx, pt left up in recliner with chair alarm on and all needs met.    Therapy  Documentation Precautions:  Precautions Precautions: Fall Precaution Comments: ataxia, RLE/RUE Restrictions Weight Bearing Restrictions: No  Vital Signs: Therapy Vitals Temp: 97.8 F (36.6 C) Pulse Rate: 64 Resp: 17 BP: 115/64 Patient Position (if appropriate): Lying Oxygen Therapy SpO2: 100 % O2 Device: Room Air Pain: Pain Assessment Pain Scale: 0-10 Pain Score: 0-No pain     Therapy/Group: Individual Therapy  Markey Deady 02/03/2021, 7:56 AM

## 2021-02-03 NOTE — Progress Notes (Signed)
PROGRESS NOTE   Subjective/Complaints:  No issues overnite, trying to use Riht hand to eat but sometimes gets too shaky ROS:  Pt denies SOB, abd pain, CP, N/V/C/D, and vision changes   Objective:   No results found. Recent Labs    02/02/21 0619  WBC 6.5  HGB 13.8  HCT 40.7  PLT 218    Recent Labs    02/02/21 0619  NA 137  K 4.2  CL 107  CO2 23  GLUCOSE 140*  BUN 22  CREATININE 1.11*  CALCIUM 9.5     Intake/Output Summary (Last 24 hours) at 02/03/2021 0756 Last data filed at 02/02/2021 1225 Gross per 24 hour  Intake 177 ml  Output --  Net 177 ml         Physical Exam: Vital Signs Blood pressure 115/64, pulse 64, temperature 97.8 F (36.6 C), resp. rate 17, height 5\' 1"  (1.549 m), weight 65.2 kg, SpO2 100 %.   General: No acute distress Mood and affect are appropriate Heart: Regular rate and rhythm no rubs murmurs or extra sounds Lungs: Clear to auscultation, breathing unlabored, no rales or wheezes Abdomen: Positive bowel sounds, soft nontender to palpation, nondistended Extremities: No clubbing, cyanosis, or edema Skin: No evidence of breakdown, no evidence of rash  Musculoskeletal:        General: No swelling or tenderness. Normal range of motion.    Cervical back: Normal range of motion. Skin:    General: Skin is warm and dry. Neurological:    Mental Status: She is alert and oriented to person, place, and time.    Comments: Speech slow and measured. Mild left facial weakness with mild dysarthria. She is able to follow simple commands without difficulty. A little slow to process.  Right sided weakness with ataxia noted in both RUE and RLE. 4/5 RUE and 5/5 LUE. RLE 4/5, LLE 5/5.    Assessment/Plan: 1. Functional deficits which require 3+ hours per day of interdisciplinary therapy in a comprehensive inpatient rehab setting. Physiatrist is providing close team supervision and 24 hour  management of active medical problems listed below. Physiatrist and rehab team continue to assess barriers to discharge/monitor patient progress toward functional and medical goals  Care Tool:  Bathing    Body parts bathed by patient: Left arm, Chest, Abdomen, Right arm, Left upper leg, Right upper leg, Front perineal area, Buttocks, Right lower leg, Left lower leg, Face         Bathing assist Assist Level: Contact Guard/Touching assist     Upper Body Dressing/Undressing Upper body dressing   What is the patient wearing?: Bra, Pull over shirt    Upper body assist Assist Level: Set up assist    Lower Body Dressing/Undressing Lower body dressing      What is the patient wearing?: Underwear/pull up, Pants     Lower body assist Assist for lower body dressing: Supervision/Verbal cueing     Toileting Toileting    Toileting assist Assist for toileting: Contact Guard/Touching assist     Transfers Chair/bed transfer  Transfers assist     Chair/bed transfer assist level: Contact Guard/Touching assist     Locomotion Ambulation   Ambulation assist  Assist level: Contact Guard/Touching assist Assistive device: Walker-rolling Max distance: 220 ft   Walk 10 feet activity   Assist     Assist level: Minimal Assistance - Patient > 75% Assistive device: Hand held assist   Walk 50 feet activity   Assist Walk 50 feet with 2 turns activity did not occur: Safety/medical concerns  Assist level: Contact Guard/Touching assist Assistive device: Walker-rolling    Walk 150 feet activity   Assist Walk 150 feet activity did not occur: Safety/medical concerns  Assist level: Contact Guard/Touching assist Assistive device: Walker-rolling    Walk 10 feet on uneven surface  activity   Assist Walk 10 feet on uneven surfaces activity did not occur: Safety/medical concerns         Wheelchair     Assist        Wheelchair assist level: Minimal  Assistance - Patient > 75% Max wheelchair distance: 150    Wheelchair 50 feet with 2 turns activity    Assist        Assist Level: Minimal Assistance - Patient > 75%   Wheelchair 150 feet activity     Assist      Assist Level: Minimal Assistance - Patient > 75%   Blood pressure 115/64, pulse 64, temperature 97.8 F (36.6 C), resp. rate 17, height 5\' 1"  (1.549 m), weight 65.2 kg, SpO2 100 %.  Medical Problem List and Plan: 1.  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  Antithrombotics: -DVT/anticoagulation:  Pharmaceutical: Lovenox             -antiplatelet therapy: ASA/plavix 3. Pain Management: Tylenol prn for HA.   6/12- denies pain- con't regimen prn 4. Mood: LCSW to follow for evaluation and support.              -antipsychotic agents: N/A 5. Neuropsych: This patient is capable of making decisions on her own behalf. 6. Skin/Wound Care: Routine pressure relief measures.  7. Fluids/Electrolytes/Nutrition:  Monitor I/O. Check CMET in am.  8. HTN: Monitor TID             --continue Lisinopril, coreg and Imdur.  6/14- controlled              Vitals:   02/02/21 1425 02/03/21 0445  BP: 120/65 115/64  Pulse: 71 64  Resp: 18 17  Temp: 97.9 F (36.6 C) 97.8 F (36.6 C)  SpO2: 99% 100%    9. T2DM: Hgb A1c-7.6.  Monitor BS ac/hs and use SSI for elevated BS.                  --resume home dose metformin.             -adjust regimen as needed             -diabetic education  CBG (last 3)  Recent Labs    02/02/21 1116 02/02/21 1614 02/03/21 0633  GLUCAP 132* 102* 116*   Controlled 6/14  10 Seizure disorder: Stable on Keppra bid. 11. Hyperlipidemia: On Statin and Zetia.              --was on Praluent twice a month.  12. Recent bilateral cataract  surgery: Completes eye  drops for right eye in a week.  2. CAD s/p CABG 2014 : Monitor for symptoms with increase in activity             --On DAPT, Imdur, Lisinopril, coreg and statin (Praluent on hold) 14. Anxiety d/o: Mood stable on Lexapro. Not using Xanax currently. 15.  OSA: Is compliant with CPAP use.  6/11- using CPAP this AM.  16. Constipation  6/12- stools hard- is main issue- will add Colace and maintain Miralax.       LOS: 4 days A FACE TO FACE EVALUATION WAS PERFORMED  Charlett Blake 02/03/2021, 7:56 AM

## 2021-02-03 NOTE — Progress Notes (Signed)
Occupational Therapy Session Note  Patient Details  Name: Samantha Huffman MRN: [id number] Date of Birth: April 05, 1956  Today's Date: 02/03/2021 OT Individual Time: 1345-1415 OT Individual Time Calculation (min): 30 min    Short Term Goals: Week 1:  OT Short Term Goal 1 (Week 1): Pt will complete either walk-in or tub/shower transfer with min A + LRAD. OT Short Term Goal 2 (Week 1): Pt will maintain static standing balance for at least 5 min with close in prep for standing ADL. OT Short Term Goal 3 (Week 1): Pt will ind state 2 energy conservation strategies to incoporate into her daily routine upon DC. OT Short Term Goal 4 (Week 1): Pt will bathe full-body with CGA.  Skilled Therapeutic Interventions/Progress Updates:    Pt received in recliner and consented to OT tx. Pt seen for instruction and training in functional transfers and mobility, with pt requiring cues for proper hand placement during transitions with fair carryover. Pt completing functional mobility and transfers with SUP-CGA with RW. Pt then instructed in standing task with [facility] activity to increase standing tolerance, balance, and [place] for ADLs. Pt instructed in large peg/pegboard activity while standing in RW with SUP while utilizing R hand to increase coordination. Pt tolerated activity well, instructed in smaller tack and cork board activity while seated to increase R hand fine motor control and coordination skills for ADLs. Pt able to complete activity with increased time. After tx, pt helped to restroom, completed all aspects of toileting with SUP, then left up in recliner with alarm on and all needs met.   Therapy Documentation Precautions:  Precautions Precautions: Fall Precaution Comments: ataxia, RLE/RUE Restrictions Weight Bearing Restrictions: No  Vital Signs: Therapy Vitals Temp: 98.6 F (37 C) Pulse Rate: 76 Resp: (!) 22 BP: 104/60 Patient Position (if appropriate): Sitting Oxygen Therapy SpO2: 100 % O2  Device: Room Air Pain: 0/10     Therapy/Group: Individual Therapy  Herbert Aguinaldo 02/03/2021, 2:52 PM

## 2021-02-04 LAB — GLUCOSE, CAPILLARY
Glucose-Capillary: 107 mg/dL — ABNORMAL HIGH (ref 70–99)
Glucose-Capillary: 81 mg/dL (ref 70–99)
Glucose-Capillary: 152 mg/dL — ABNORMAL HIGH (ref 70–99)
Glucose-Capillary: 135 mg/dL — ABNORMAL HIGH (ref 70–99)

## 2021-02-04 NOTE — Progress Notes (Signed)
Physical Therapy Session Note  Patient Details  Name: Samantha Huffman MRN: [id number] Date of Birth: 1956-06-12  Today's Date: 02/04/2021 PT Individual Time: 1031-1100 and 1335-1445 PT Individual Time Calculation (min): 29 min and 70 min  Short Term Goals: Week 1:  PT Short Term Goal 1 (Week 1): Pt will trasnfer to and from bed with CGA and LRAD PT Short Term Goal 2 (Week 1): Pt will perform gait training with LRAD and CGA >126ft PT Short Term Goal 3 (Week 1): Pt will complete Berg balance test  Skilled Therapeutic Interventions/Progress Updates:  Session 1: Patient supine in bed on entrance to room and completing session with Recreation Therapy. Reminded pt re: desire for colored pencils and coloring sheets in order to provide activity as well as to continue to develop fine motor control of RUE. Rec therapist provides pencils and coloring sheet during session. Patient alert and agreeable to PT session although states she is very fatigued. When asked re: overnight sleeping, she relates that her cPAP machine fell to the floor and woke her up around 2:30am and disrupted her sleep. But pt has also had heavy days of therapy in the previous few days and reminded that she is most likely also bodily fatigued from this as well. Patient denied pain during session.  Therapeutic Activity: Bed Mobility: Patient performed supine <> sit with supervision. VC/ tc required for maintaining sitting balance while reaching to floor for shoes which are outside of BOS. Able to don with supervision and extra time to tie laces.  Transfers: Patient performed ambulatory transfer from bed to toilet and back to bed with overall supervision/ intermittent CGA for balance/ ataxic movements. Provided vc for gentle forward walker progression to decrease ataxic push from RUE.  Pt continues to demonstrate good standing balance while managing clothing at toilet and with no UE support. Completes clothing mgmt and pericare with  supervision.  At [facility], focused practice on STS transfers without AD. Blocked practice with slightly elevated bed surface and vc to scoot forward to prevent Bil knee extension into EOB for support. Initially fearful of falling and requiring one hand from therapist for UE support. Pt guided in push from bed surface for assist in power up, then hands out to balance and progressing to hands to side. Pt progresses from [place] to supervision for rise to stand and controlled descent with hands on thighs.   Patient supine in bed and semireclined at end of session with brakes locked, bed alarm set, and all needs within reach. Colored pencils and coloring pages set up for pt.   Session 2: Patient supine in bed on entrance to room. Patient alert and agreeable to PT session although still feeling fatigued. Patient denied pain during session.  Therapeutic Activity: Bed Mobility: Patient performed supine <> sit with supervision. No vc required.  Transfers: Patient performed STS, SPVT, and toilet transfers with close supervision throughout session. Provided vc for hand placement, increasing forward lean, and reaching back for armrests of seat prior to descent to sit throughout session.  Gait Training:  Patient ambulated 69' x1/ 120' x1/ 110' x1 using RW with close supervision and intermittent CGA.  Demonstrated intermittent ataxic push of walker with RUE followed by change in stepping pattern with RLE. Provided vc/ tc for increasing R step height/ length, heel-to-toe gait pattern, and forward push of RW.  Neuromuscular Re-ed: NMR facilitated during session with focus on standing balance and RUE coordination/ motor control. Pt guided in use of BITS for 2 bouts of  shape tracing task and one bout of single target tapping all with use of RUE. Shape Tracing 1: 37min, 23.23% coverage Shape Tracing 2: 20min, 70.15% coverage Visual Scanning/ Single Target User Paced: 79.1% accurate with 3.39sec reaction time, 35min bout  hitting 53 targets  Initially demonstrated ataxic movements of RUE requiring relaxation of R arm and improved movement following.   NMR performed for improvements in motor control and coordination, balance, sequencing, judgement, and self confidence/ efficacy in performing all aspects of mobility at highest level of independence.   Patient supine in bed at end of session with brakes locked, bed alarm set, and all needs within reach.     Therapy Documentation Precautions:  Precautions Precautions: Fall Precaution Comments: ataxia, RLE/RUE Restrictions Weight Bearing Restrictions: No  Therapy/Group: Individual Therapy  Alger Simons PT, DPT 02/04/2021, 5:45 PM

## 2021-02-04 NOTE — Progress Notes (Signed)
Occupational Therapy Session Note  Patient Details  Name: Samantha Huffman MRN: [id number] Date of Birth: June 16, 1956  Today's Date: 02/04/2021 OT Individual Time: 1594-5859 OT Individual Time Calculation (min): 59 min    Short Term Goals: Week 1:  OT Short Term Goal 1 (Week 1): Pt will complete either walk-in or tub/shower transfer with min A + LRAD. OT Short Term Goal 2 (Week 1): Pt will maintain static standing balance for at least 5 min with close in prep for standing ADL. OT Short Term Goal 3 (Week 1): Pt will ind state 2 energy conservation strategies to incoporate into her daily routine upon DC. OT Short Term Goal 4 (Week 1): Pt will bathe full-body with CGA.  Skilled Therapeutic Interventions/Progress Updates:    Pt received supine in bed, lethargic but easily aroused by voice, agreeable to OT session and reporting no pain. Pt agreeable to shower and BADLs. Sup<>sit close spvsn with use of bed rails. Donned/doffed nonskid socks set up A. Sit<>stand CGA with use of RW; vc's required throughout session during ambulation and transfers with RW to stay within walker and for safe hand placement with fair carryover. Pt ambulated bed<>bathroom with RW at [place]. Completed toileting with close spvsn-CGA. Showered on TTB with close spvsn with noted functional use of RUE throughout without cueing. Pt education on safe transfers onto TTB and discussion of possible DME recommendations for safety at home; needs further reinforcement and practice. UB dressing set up A sitting EOB; LB dressing close spvsn when in stance to pull pants over hips. Ataxic movements in RUE during dressing tasks noted. Pt reported visual disturbances/limitations in R eye which was further assessed. Pt demo's fair occulomotor skills (saccades, pursuits, convergence/divergence) in bilateral eyes but demo's difficulties in bilateral peripheral vision as well as left field deficits in R eye when L eye occluded. Pt reported decreased visual  acuity in R eye when L occluded. Pt education on compensatory techniques due to visual deficits and recommendation of f/u with MD/optometrist/opthalmologist. Provided emotional support as pt reported feeling frustrated with her dysarthria as well. Pt remained supine in bed, alarm set, all needs met, ready for impending TR session.  Therapy Documentation Precautions:  Precautions Precautions: Fall Precaution Comments: ataxia, RLE/RUE Restrictions Weight Bearing Restrictions: No  Pain: Pain Assessment Pain Scale: 0-10 Pain Score: 0-No pain   Therapy/Group: Individual Therapy  Mellissa Kohut 02/04/2021, 4:12 PM

## 2021-02-04 NOTE — Progress Notes (Signed)
PROGRESS NOTE   Subjective/Complaints:  No issues overnite, pt indicates that her last cataract surgery was 3 wks ago , had post op check 5/18 for one eye and opposite eye 5/23- Dr Gershon Crane  ROS:  Pt denies SOB, abd pain, CP, N/V/C/D, and vision changes   Objective:   No results found. Recent Labs    02/02/21 0619  WBC 6.5  HGB 13.8  HCT 40.7  PLT 218    Recent Labs    02/02/21 0619  NA 137  K 4.2  CL 107  CO2 23  GLUCOSE 140*  BUN 22  CREATININE 1.11*  CALCIUM 9.5     Intake/Output Summary (Last 24 hours) at 02/04/2021 0815 Last data filed at 02/04/2021 0807 Gross per 24 hour  Intake 1140 ml  Output --  Net 1140 ml         Physical Exam: Vital Signs Blood pressure (!) 118/56, pulse 63, temperature 97.8 F (36.6 C), resp. rate 18, height 5\' 1"  (1.549 m), weight 65.2 kg, SpO2 99 %.  General: No acute distress Mood and affect are appropriate Heart: Regular rate and rhythm no rubs murmurs or extra sounds Lungs: Clear to auscultation, breathing unlabored, no rales or wheezes Abdomen: Positive bowel sounds, soft nontender to palpation, nondistended Extremities: No clubbing, cyanosis, or edema Skin: No evidence of breakdown, no evidence of rash   Musculoskeletal:        General: No swelling or tenderness. Normal range of motion.    Cervical back: Normal range of motion. Skin:    General: Skin is warm and dry. Neurological:    Mental Status: She is alert and oriented to person, place, and time.    Comments: Speech slow and measured. Mild left facial weakness with mild dysarthria. She is able to follow simple commands without difficulty. A little slow to process.  Right sided weakness with ataxia noted in both RUE and RLE. 4/5 RUE and 5/5 LUE. RLE 4/5, LLE 5/5.    Assessment/Plan: 1. Functional deficits which require 3+ hours per day of interdisciplinary therapy in a comprehensive inpatient rehab  setting. Physiatrist is providing close team supervision and 24 hour management of active medical problems listed below. Physiatrist and rehab team continue to assess barriers to discharge/monitor patient progress toward functional and medical goals  Care Tool:  Bathing    Body parts bathed by patient: Left arm, Chest, Abdomen, Right arm, Left upper leg, Right upper leg, Front perineal area, Buttocks, Right lower leg, Left lower leg, Face         Bathing assist Assist Level: Supervision/Verbal cueing     Upper Body Dressing/Undressing Upper body dressing   What is the patient wearing?: Bra, Pull over shirt    Upper body assist Assist Level: Set up assist    Lower Body Dressing/Undressing Lower body dressing      What is the patient wearing?: Underwear/pull up, Pants     Lower body assist Assist for lower body dressing: Supervision/Verbal cueing     Toileting Toileting    Toileting assist Assist for toileting: Supervision/Verbal cueing     Transfers Chair/bed transfer  Transfers assist     Chair/bed transfer assist  level: Contact Guard/Touching assist     Locomotion Ambulation   Ambulation assist      Assist level: Contact Guard/Touching assist Assistive device: Walker-rolling Max distance: 220 ft   Walk 10 feet activity   Assist     Assist level: Minimal Assistance - Patient > 75% Assistive device: Hand held assist   Walk 50 feet activity   Assist Walk 50 feet with 2 turns activity did not occur: Safety/medical concerns  Assist level: Contact Guard/Touching assist Assistive device: Walker-rolling    Walk 150 feet activity   Assist Walk 150 feet activity did not occur: Safety/medical concerns  Assist level: Contact Guard/Touching assist Assistive device: Walker-rolling    Walk 10 feet on uneven surface  activity   Assist Walk 10 feet on uneven surfaces activity did not occur: Safety/medical concerns          Wheelchair     Assist        Wheelchair assist level: Minimal Assistance - Patient > 75% Max wheelchair distance: 150    Wheelchair 50 feet with 2 turns activity    Assist        Assist Level: Minimal Assistance - Patient > 75%   Wheelchair 150 feet activity     Assist      Assist Level: Minimal Assistance - Patient > 75%   Blood pressure (!) 118/56, pulse 63, temperature 97.8 F (36.6 C), resp. rate 18, height 5\' 1"  (1.549 m), weight 65.2 kg, SpO2 99 %.  Medical Problem List and Plan: 1.  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  Antithrombotics: -DVT/anticoagulation:  Pharmaceutical: Lovenox             -antiplatelet therapy: ASA/plavix 3. Pain Management: Tylenol prn for HA.   6/12- denies pain- con't regimen prn 4. Mood: LCSW to follow for evaluation and support.              -antipsychotic agents: N/A 5. Neuropsych: This patient is capable of making decisions on her own behalf. 6. Skin/Wound Care: Routine pressure relief measures.  7. Fluids/Electrolytes/Nutrition:  Monitor I/O. Check CMET in am.  8. HTN: Monitor TID             --continue Lisinopril, coreg and Imdur.  6/15- controlled              Vitals:   02/03/21 1917 02/04/21 0514  BP: 126/69 (!) 118/56  Pulse: 74 63  Resp: 18 18  Temp: 97.9 F (36.6 C) 97.8 F (36.6 C)  SpO2: 99% 99%    9. T2DM: Hgb A1c-7.6.  Monitor BS ac/hs and use SSI for elevated BS.                  --resume home dose metformin.             -adjust regimen as needed             -diabetic education  CBG (last 3)  Recent Labs    02/03/21 1702 02/03/21 2031 02/04/21 0540  GLUCAP 94 107* 107*   Controlled 6/15  10 Seizure disorder: Stable on Keppra bid. 11. Hyperlipidemia: On Statin and Zetia.               --  was on Praluent twice a month.  12. Recent bilateral cataract surgery: Completes eye drops for right eye in a week.  27. CAD s/p CABG 2014 : Monitor for symptoms with increase in activity             --On DAPT, Imdur, Lisinopril, coreg and statin (Praluent on hold) 14. Anxiety d/o: Mood stable on Lexapro. Not using Xanax currently. 15.  OSA: Is compliant with CPAP use.  6/11- using CPAP this AM.  16. Constipation  6/12- stools hard- is main issue- will add Colace and maintain Miralax.       LOS: 5 days A FACE TO FACE EVALUATION WAS PERFORMED  Charlett Blake 02/04/2021, 8:15 AM

## 2021-02-04 NOTE — Progress Notes (Signed)
Patient ID: Samantha Huffman, female   DOB: 05/03/1956, 65 y.o.   MRN: [id number] Team Conference Report to Patient/Family  Team Conference discussion was reviewed with the patient and caregiver, including goals, any changes in plan of care and target discharge date.  Patient and caregiver express understanding and are in agreement.  The patient has a target discharge date of 02/11/21.  SW met with patient and called daughter at bedside. Daughter concerned with PT not having ST. SW will follow up  Dyanne Iha 02/04/2021, 2:48 PM

## 2021-02-04 NOTE — Progress Notes (Signed)
Physical Therapy Session Note  Patient Details  Name: Samantha Huffman MRN: [id number] Date of Birth: 1956-04-11  Today's Date: 02/04/2021 PT Individual Time: 4403-4742 PT Individual Time Calculation (min): 60 min   Short Term Goals: Week 1:  PT Short Term Goal 1 (Week 1): Pt will trasnfer to and from bed with CGA and LRAD PT Short Term Goal 2 (Week 1): Pt will perform gait training with LRAD and CGA >152ft PT Short Term Goal 3 (Week 1): Pt will complete Berg balance test  Skilled Therapeutic Interventions/Progress Updates:    Pt received R sidelying in bed, asleep requiring increased time to awaken and remained lethargic throughout session. Pt very motivated to participate in session. Sidelying>sitting EOB with supervision. Pt notified RN for eye drop administration. Sitting EOB donned shoes set-up assist with increased time for managing laces. Sit<>stands with and without RW, CGA for steadying during session, pt noted to use backs of legs against chair/mat/bed for stability while coming to stand. Gait training ~25ft in/out bathroom using RW with CGA for safety. Standing with CGA/close supervision while pt managed LB clothing without assist - continent of bladder and performed seated peri-care without assist. Standing hand hygiene at sink - cuing for safe AD positioning at counter. NT in/out for blood sugar assessment.   Gait training ~152ft to main therapy gym using RW with CGA for safety - noted to have short step lengths with decreased heel strike on initial contact, cuing for improvement.   Donned maxi-sky harness to participate in dynamic gait training without UE support and without manual facilitation. Gait training down/back ~88ft ~15x total with 3x seated rest break throughout session. Forward gait training focusing on reciprocal stepping patterns, increased step lengths, increased heel strike, and arm swing - pt demos excessive trunk extension likely to counteract the anterior/forward  momentum of gait due to impaired balance, varying stepping patterns with pt intermittently shuffling causing forefoot landing on initial contact, and rigid trunk posture with guarded B UE positioning - cuing for improvement throughout. Lateral stepping gait training with cuing to avoid excessive hip ER to allow increased hip abductor activation - no UE support. Repeated forward gait training after side stepping with pt noted to have improving step lengths and heel contact.  Pt becoming emotional with happy tears after gait training in maxi-sky harness stating she was so excited to be able to walk without an AD and without assistance - therapist provided emotional support and encouragement to continue working towards increasing independence with functional mobility during therapy sessions.  Gait training 193ft back to room, no AD, with min assist for balance - continues to have slight excessive trunk extension and progressively shorter more shuffled stepping patterns at end when becoming fatigued. Sit>supine supervision. Pt left supine in bed with needs in reach and bed alarm on.   Therapy Documentation Precautions:  Precautions Precautions: Fall Precaution Comments: ataxia, RLE/RUE Restrictions Weight Bearing Restrictions: No   Pain:  No reports of pain throughout session.  Therapy/Group: Individual Therapy  Tawana Scale , PT, DPT, CSRS 02/04/2021, 12:35 PM

## 2021-02-04 NOTE — Patient Care Conference (Signed)
Inpatient RehabilitationTeam Conference and Plan of Care Update Date: 02/04/2021   Time: 10:00 AM    Patient Name: Samantha Huffman      Medical Record Number: [id number]  Date of Birth: 05-25-1956 Sex: Female         Room/Bed: 4M13C/4M13C-01 Payor Info: Payor: [employer]alling MEDICARE / Plan: [employer] MEDICARE HMO/PPO / Product Type: *No Product type* /    Admit Date/Time:  01/30/2021  1:58 PM  Primary Diagnosis:  Cerebellar stroke [facility])  Hospital Problems: Principal Problem:   Cerebellar stroke [facility])    Expected Discharge Date: Expected Discharge Date: 02/11/21  Team Members Present: Physician leading conference: Dr. Alysia Penna Care Coodinator Present: Dorien Chihuahua, RN, BSN, CRRN;Christina Sampson Goon, [place] Nurse Present: Dorien Chihuahua, RN PT Present: Other (comment) Filbert Berthold, PT) OT Present: Clyda Greener, OT PPS Coordinator present : Ileana Ladd, PT     Current Status/Progress Goal Weekly Team Focus  Bowel/Bladder   Continent of B/B. LBM 6/13  Remain continent  Assess Qshift and PRN   Swallow/Nutrition/ Hydration             ADL's   sup-CGA for all ADLs, cuing for safety. Ataxic movements improving however pt occasionally still drops small items (soap in shower, toothpaste, etc) during ADLs.  supervision  ADL retraining, balance, coordination, general strength and conditioning, safety   Mobility   Bed mobility = SUP/ CGA; Transfers = CGA; Gait = CGA using RW with improving ataxic movements  Overall supervision for all functional mobility  Coordination, motor control and planning, transfers, gait   Communication             Safety/Cognition/ Behavioral Observations            Pain   No c/o pain  Pain <3/10  Assess Qshift and PRN   Skin   Skin intact  Maintain skin integrity  Assess Qshift and PRN     Discharge Planning:  Patient will discharge home with daughter [name] ) to provide care, [facility] 1 level home, level entry   Team Discussion: Some ataxia noted and  freezes when pivoting. Alien UE and when ataxia kicks in with UE; LE follows suit and impairs gait/function.  Patient on target to meet rehab goals: yes, currently supervision - CGA for ADLs and occasionally drops items. CGA for gait however can be min assist at times. Supervision goals set for discharge.  *See Care Plan and progress notes for long and short-term goals.   Revisions to Treatment Plan:    Teaching Needs: Transfers, toileting, safety, secondary stroke risk management, medications, etc  Current Barriers to Discharge: Decreased caregiver support and Home enviroment access/layout  Possible Resolutions to Barriers: Family education with daughter     Medical Summary Current Status: Has difficulty putting in eyedrops with right hand as well as feeding self due to ataxia, blood pressures are well controlled  Barriers to Discharge: Other (comments)  Barriers to Discharge Comments: Balance and ataxia Possible Resolutions to Barriers/Weekly Focus: Continue neuromuscular reeducation, fall prevention strategies   Continued Need for Acute Rehabilitation Level of Care: The patient requires daily medical management by a physician with specialized training in physical medicine and rehabilitation for the following reasons: Direction of a multidisciplinary physical rehabilitation program to maximize functional independence : Yes Medical management of patient stability for increased activity during participation in an intensive rehabilitation regime.: Yes Analysis of laboratory values and/or radiology reports with any subsequent need for medication adjustment and/or medical intervention. : Yes   I attest that  I was present, lead the team conference, and concur with the assessment and plan of the team.   Margarito Liner 02/04/2021, 11:50 AM

## 2021-02-04 NOTE — Evaluation (Signed)
Recreational Therapy Assessment and Plan  Patient Details  Name: Samantha Huffman MRN: [id number] Date of Birth: 18-Nov-1955 Today's Date: 02/04/2021  Rehab Potential:  Good ELOS:   d/c 6/22  Assessment  Hospital Problem: Principal Problem:   Cerebellar stroke [facility])     Past Medical History:      Past Medical History:  Diagnosis Date   Carotid artery stenosis      1-39% bilateral stenosis by dopplers 08/2020   Coronary artery disease      a. s/p CABG 10/2012.  cath 05/2016 with moderate LM stenosis, mild LCx and RCA stenosis and occluded LAD with patent LIMA to LAD, free radial to OM.  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   Dyslipidemia     Family history of adverse reaction to anesthesia      mother and sister ponv   Generalized convulsive epilepsy without mention of intractable epilepsy     GERD (gastroesophageal reflux disease)     History of kidney stones     Hypertension     Memory deficit      slight   Migraine      "controlled on daily RX " (06/11/2016)   Nephrolithiasis 07/30/2015    S/p lithotripsy x 3 and right and left ureter stents   OSA (obstructive sleep apnea)      cpap   PONV (postoperative nausea and vomiting)     Seizures ([place])      "controlled w/daily RX; started in 2012; dr thinks they might be from the migraines; last sz was early part of 2016" (06/11/2016)   Type II diabetes mellitus ([place])      metformin   Vasovagal syncope 07/30/2015    2011    Past Surgical History:       Past Surgical History:  Procedure Laterality Date   CARDIAC CATHETERIZATION   2014; 06/11/2016   CARDIAC CATHETERIZATION N/A 06/11/2016    Procedure: Left Heart Cath and Cors/Grafts Angiography;  Surgeon: Sherren Mocha, MD;  Location: [place] [facility];  Service: Cardiovascular;  Laterality: N/A;   CESAREAN SECTION       CORONARY ANGIOPLASTY       CORONARY ARTERY BYPASS GRAFT   March, 2014    LIMA to LAD, left radial to LCx x 2   CYSTOSCOPY W/ URETERAL STENT PLACEMENT  Left 01/16/2019    Procedure: CYSTOSCOPY WITH RETROGRADE PYELOGRAM/URETERAL LEFT STENT PLACEMENT;  Surgeon: Ardis Hughs, MD;  Location: [facility];  Service: Urology;  Laterality: Left;   CYSTOSCOPY/URETEROSCOPY/HOLMIUM LASER/STENT PLACEMENT Left 01/24/2019    Procedure: CYSTOSCOPY, LEFT URETEROSCOPY, HOLMIUM LASER, STONE REMOVAL AND STENT EXCHANGE;  Surgeon: Ardis Hughs, MD;  Location: [facility];  Service: Urology;  Laterality: Left;   LITHOTRIPSY   X 3   RIGHT/LEFT HEART CATH AND CORONARY/GRAFT ANGIOGRAPHY N/A 09/08/2018    Procedure: RIGHT/LEFT HEART CATH AND CORONARY/GRAFT ANGIOGRAPHY;  Surgeon: Martinique, Peter M, MD;  Location: [place] [facility];  Service: Cardiovascular;  Laterality: N/A;   TOTAL ABDOMINAL HYSTERECTOMY        ovaries took before hysterectomy   TUBAL LIGATION       URETERAL STENT PLACEMENT          Assessment & Plan Clinical Impression: Patient is a 65 year old RH-female with history of CAD, HTN, T2DM, OSA, renal calculi, seizure d/o; who was admitted on 01/20/21 with onset of weakness right side with unsteady gait for 2-3 days. She reported having lifted a case of water the day before  and started feeling dizzy. She ws found to have occlusion of R-SCA and acute/subacute right cerebellar stroke with small infarcts in right PICA and left thalamus. 2 D echo showed EF 60-65% with trivial MVR. DR. Xu questioned vertebral dissection as cause of stroke and repeat CT head in 2 days to rule out hydrocephalus. ASA increased to 325 mg with recommendations of DAPT X 3 months followed by lower dose ASA/Plavix as per home regimen.  Follow up CT head 06/3 and 06/05 showed stable right cerebellar and thalamic infarcts with out hemorrhage or mass effect. She continues to have deficits due to right sided weakness with ataxia & sensory deficits, dizziness as well as visual deficits affecting ADLs and mobility. Patient transferred to CIR on 01/30/2021 .   Pt presents with decreased activity  tolerance, decreased functional mobility, decreased balance, decreased coordination, decreased vision Limiting pt's independence with leisure/community pursuits.  Met with pt today to discuss leisure interests, activity analysis/modifications, coping strategies & provide diversional supplies in the room.     Plan  Min 1 session >20 minutes per week during LOS  Recommendations for other services: None   Discharge Criteria: Patient will be discharged from TR if patient refuses treatment 3 consecutive times without medical reason.  If treatment goals not met, if there is a change in medical status, if patient makes no progress towards goals or if patient is discharged from hospital.  The above assessment, treatment plan, treatment alternatives and goals were discussed and mutually agreed upon: by patient  [place] 02/04/2021, 3:01 PM

## 2021-02-05 LAB — GLUCOSE, CAPILLARY
Glucose-Capillary: 105 mg/dL — ABNORMAL HIGH (ref 70–99)
Glucose-Capillary: 116 mg/dL — ABNORMAL HIGH (ref 70–99)
Glucose-Capillary: 102 mg/dL — ABNORMAL HIGH (ref 70–99)
Glucose-Capillary: 131 mg/dL — ABNORMAL HIGH (ref 70–99)

## 2021-02-05 NOTE — Progress Notes (Signed)
Occupational Therapy Session Note  Patient Details  Name: Samantha Huffman MRN: [id number] Date of Birth: 04-13-1956  Today's Date: 02/05/2021 OT Individual Time: 2376-2831 OT Individual Time Calculation (min): 58 min    Short Term Goals: Week 1:  OT Short Term Goal 1 (Week 1): Pt will complete either walk-in or tub/shower transfer with min A + LRAD. OT Short Term Goal 2 (Week 1): Pt will maintain static standing balance for at least 5 min with close in prep for standing ADL. OT Short Term Goal 3 (Week 1): Pt will ind state 2 energy conservation strategies to incoporate into her daily routine upon DC. OT Short Term Goal 4 (Week 1): Pt will bathe full-body with CGA.  Skilled Therapeutic Interventions/Progress Updates:    Pt received in room in bed finishing breakfast and consented to OT tx. Pt is very lethargic during session, reports she feels fine, that she is just sleepy. Discussed with pt her new discharge plan as she now has a go-home date of 6/22. Pt reports she will be going to stay with her youngest daughter in her 25rd floor apartment (no elevator) when she discharges, and will move in with her oldest daughter in her new house on 6/30. The 3rd floor apartment has a tub shower and standard toilet. Pt performed bed mobility with mod I, walked with RW into bathroom with close SUP, but required CGA for toilet transfer, completed toileting with SUP. Pt then instructed to complete hand and oral hygiene while standing sink side with SUP, pt washed up standing sink side as well. Pt then was able to get dressed sitting EOB with setup. Pt is very slow moving and requires increased time for all ADLs. Pt able to don socks and shoes and tie shoe laces with setup and increased time. After dressing, pt walked to recliner with RW and CGA as pt became a little off balanced, was left up in recliner with all needs met, call light in reach, and chair alarm on.   Therapy Documentation Precautions:   Precautions Precautions: Fall Precaution Comments: ataxia, RLE/RUE Restrictions Weight Bearing Restrictions: No  Vital Signs: Therapy Vitals Temp: 98.3 F (36.8 C) Pulse Rate: 62 Resp: 18 BP: 136/67 Patient Position (if appropriate): Lying Oxygen Therapy SpO2: 100 % O2 Device: Room Air Pain: Pain Assessment Pain Scale: 0-10 Pain Score: 0-No pain    Therapy/Group: Individual Therapy  Tatem Holsonback 02/05/2021, 8:04 AM

## 2021-02-05 NOTE — Progress Notes (Signed)
Physical Therapy Session Note  Patient Details  Name: Samantha Huffman MRN: [id number] Date of Birth: 02-Sep-1955  Today's Date: 02/05/2021 PT Individual Time: 1051-1200 PT Individual Time Calculation (min): 69 min   Short Term Goals: Week 1:  PT Short Term Goal 1 (Week 1): Pt will trasnfer to and from bed with CGA and LRAD PT Short Term Goal 2 (Week 1): Pt will perform gait training with LRAD and CGA >158f PT Short Term Goal 3 (Week 1): Pt will complete Berg balance test   Skilled Therapeutic Interventions/Progress Updates:   Pt received supine in bed and agreeable to PT. Supine>sit transfer with supervision assist and cues for safety and decreased use of rails. Stand pivpo transfer without AD min assist for safety with cues for step width and posture  Transported to orthogym in WC. BITS visual motor tracing shapes x 5 and replicating x 4. Maze test 1:49, 5 errors. RUE to perform all tasks without UE support. Verbals instruction for erect posture, improved visual scanning to the R.   Dynamic gait training in [place]  No UE support on level surface 127fx 10 with min assist due to mild R LOB intermittently.  Stepping over 2 hockey sticks on the floor, leading with the RLE, x 6; min-mod assist from PT intermittently Side stepping R and L 1554f 4 each with min assist  Multimodal instruction for improved weight shift to the L, improved terminal knee extension in stance on the R, as well as cues for improve heel contact on the R and symmetrical step length R and L.    Pt returned to room and performed stand pivot transfer to bed with no AD and min assist. Sit>supine completed without assist, and left supine in bed with call bell in reach and all needs met.       Therapy Documentation Precautions:  Precautions Precautions: Fall Precaution Comments: ataxia, RLE/RUE Restrictions Weight Bearing Restrictions: No    Pain:   denies     Therapy/Group: Individual Therapy  AusLorie Phenix16/2022, 11:59 AM

## 2021-02-05 NOTE — Plan of Care (Signed)
  Problem: RH Balance Goal: LTG Patient will maintain dynamic standing balance (PT) Description: LTG:  Patient will maintain dynamic standing balance with assistance during mobility activities (PT) Flowsheets (Taken 02/05/2021 1202) LTG: Pt will maintain dynamic standing balance during mobility activities with:: Supervision/Verbal cueing   Problem: RH Bed Mobility Goal: LTG Patient will perform bed mobility with assist (PT) Description: LTG: Patient will perform bed mobility with assistance, with/without cues (PT). Flowsheets (Taken 02/05/2021 1202) LTG: Pt will perform bed mobility with assistance level of: Independent with assistive device    Problem: RH Bed to Chair Transfers Goal: LTG Patient will perform bed/chair transfers w/assist (PT) Description: LTG: Patient will perform bed to chair transfers with assistance (PT). Flowsheets (Taken 01/31/2021 1202) LTG: Pt will perform Bed to Chair Transfers with assistance level: Supervision/Verbal cueing   Problem: RH Car Transfers Goal: LTG Patient will perform car transfers with assist (PT) Description: LTG: Patient will perform car transfers with assistance (PT). Flowsheets (Taken 02/05/2021 1202) LTG: Pt will perform car transfers with assist:: Supervision/Verbal cueing   Problem: RH Ambulation Goal: LTG Patient will ambulate in controlled environment (PT) Description: LTG: Patient will ambulate in a controlled environment, # of feet with assistance (PT). Flowsheets (Taken 02/05/2021 1202) LTG: Pt will ambulate in controlled environ  assist needed:: Supervision/Verbal cueing LTG: Ambulation distance in controlled environment: 166ft with LRAD Goal: LTG Patient will ambulate in home environment (PT) Description: LTG: Patient will ambulate in home environment, # of feet with assistance (PT). Flowsheets (Taken 02/05/2021 1202) LTG: Pt will ambulate in home environ  assist needed:: Supervision/Verbal cueing LTG: Ambulation distance in home  environment: 71ft with LRAD   Problem: RH Stairs Goal: LTG Patient will ambulate up and down stairs w/assist (PT) Description: LTG: Patient will ambulate up and down # of stairs with assistance (PT) Flowsheets (Taken 01/31/2021 1202) LTG: Pt will ambulate up/down stairs assist needed:: Supervision/Verbal cueing LTG: Pt will  ambulate up and down number of stairs: 2 flights of steps (~24) to access 3rd floor apartment.

## 2021-02-05 NOTE — Evaluation (Signed)
Speech Language Pathology Assessment and Plan  Patient Details  Name: Samantha Huffman MRN: [id number] Date of Birth: 04-22-1956  SLP Diagnosis: Dysarthria;Cognitive Impairments  Rehab Potential: Excellent ELOS: 02/11/21    Today's Date: 02/05/2021 SLP Individual Time: 1410-1440 SLP Individual Time Calculation (min): 30 min   Hospital Problem: Principal Problem:   Cerebellar stroke [facility])  Past Medical History:  Past Medical History:  Diagnosis Date   Carotid artery stenosis    1-39% bilateral stenosis by dopplers 08/2020   Coronary artery disease    a. s/p CABG 10/2012.  cath 05/2016 with moderate LM stenosis, mild LCx and RCA stenosis and occluded LAD with patent LIMA to LAD, free radial to OM.  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   Dyslipidemia    Family history of adverse reaction to anesthesia    mother and sister ponv   Generalized convulsive epilepsy without mention of intractable epilepsy    GERD (gastroesophageal reflux disease)    History of kidney stones    Hypertension    Memory deficit    slight   Migraine    "controlled on daily RX " (06/11/2016)   Nephrolithiasis 07/30/2015   S/p lithotripsy x 3 and right and left ureter stents   OSA (obstructive sleep apnea)    cpap    PONV (postoperative nausea and vomiting)    Seizures ([place])    "controlled w/daily RX; started in 2012; dr thinks they might be from the migraines; last sz was early part of 2016" (06/11/2016)   Type II diabetes mellitus ([place])    metformin   Vasovagal syncope 07/30/2015   2011   Past Surgical History:  Past Surgical History:  Procedure Laterality Date   CARDIAC CATHETERIZATION  2014; 06/11/2016   CARDIAC CATHETERIZATION N/A 06/11/2016   Procedure: Left Heart Cath and Cors/Grafts Angiography;  Surgeon: Sherren Mocha, MD;  Location: [place] [facility];  Service: Cardiovascular;  Laterality: N/A;   CESAREAN SECTION     CORONARY ANGIOPLASTY     CORONARY ARTERY BYPASS GRAFT  March, 2014    LIMA to LAD, left radial to LCx x 2   CYSTOSCOPY W/ URETERAL STENT PLACEMENT Left 01/16/2019   Procedure: CYSTOSCOPY WITH RETROGRADE PYELOGRAM/URETERAL LEFT STENT PLACEMENT;  Surgeon: Ardis Hughs, MD;  Location: [facility];  Service: Urology;  Laterality: Left;   CYSTOSCOPY/URETEROSCOPY/HOLMIUM LASER/STENT PLACEMENT Left 01/24/2019   Procedure: CYSTOSCOPY, LEFT URETEROSCOPY, HOLMIUM LASER, STONE REMOVAL AND STENT EXCHANGE;  Surgeon: Ardis Hughs, MD;  Location: [facility];  Service: Urology;  Laterality: Left;   LITHOTRIPSY  X 3   RIGHT/LEFT HEART CATH AND CORONARY/GRAFT ANGIOGRAPHY N/A 09/08/2018   Procedure: RIGHT/LEFT HEART CATH AND CORONARY/GRAFT ANGIOGRAPHY;  Surgeon: Martinique, Peter M, MD;  Location: [place] [facility];  Service: Cardiovascular;  Laterality: N/A;   TOTAL ABDOMINAL HYSTERECTOMY     ovaries took before hysterectomy   TUBAL LIGATION     URETERAL STENT PLACEMENT      Assessment / Plan / Recommendation Clinical Impression Patient is a 65 year old RH-female with history of CAD, HTN, T2DM, OSA, renal calculi, seizure d/o; who was admitted on 01/20/21 with onset of weakness right side with unsteady gait for 2-3 days. She reported having lifted a case of water the day before and started feeling dizzy. She ws found to have occlusion of R-SCA and acute/subacute right cerebellar stroke with small infarcts in right PICA and left thalamus. 2 D echo showed EF 60-65% with trivial MVR. DR. Xu questioned vertebral dissection  as cause of stroke and repeat CT head in 2 days to rule out hydrocephalus. ASA increased to 325 mg with recommendations of DAPT X 3 months followed by lower dose ASA/Plavix as per home regimen.  Follow up CT head 06/3 and 06/05 showed stable right cerebellar and thalamic infarcts with out hemorrhage or mass effect. She continues to have deficits due to right sided weakness with ataxia & sensory deficits, dizziness as well as visual deficits affecting ADLs and mobility. CIR  recommended due to functional decline. Patient reported speech changes and requested an SLP evaluation.  Patient appeared lethargic during time of evaluation but agreeable to participate. Patient demonstrated a mild-moderate ataxic dysarthria characterized by imprecise consonants and a slow prosody of speech. Mild high-level word finding deficits, delayed processing, and short-term memory deficits were noted during a functional conversation. Patient would benefit from skilled SLP intervention to maximize her cognitive functioning and functional communication prior to discharge.    Skilled Therapeutic Interventions          Administered a cognitive-linguistic evaluation, please see above for details.   SLP Assessment  Patient will need skilled [place] during CIR admission    Recommendations  Oral Care Recommendations: Oral care BID Patient destination: Home Follow up Recommendations: Outpatient SLP Equipment Recommended: None recommended by SLP    SLP Frequency 3 to 5 out of 7 days   SLP Duration  SLP Intensity  SLP Treatment/Interventions 02/11/21  Minumum of 1-2 x/day, 30 to 90 minutes  Cognitive remediation/compensation;Internal/external aids;Speech/Language facilitation;Therapeutic Activities;Environmental controls;Cueing hierarchy;Functional tasks;Patient/family education    Pain No/Denies Pain  SLP Evaluation Cognition Overall Cognitive Status: Impaired/Different from baseline Arousal/Alertness: Awake/alert Orientation Level: Oriented X4 Attention: Sustained Sustained Attention: Impaired Sustained Attention Impairment: Verbal complex;Functional complex Memory: Impaired Memory Impairment: Storage deficit Awareness: Appears intact Problem Solving: Appears intact Safety/Judgment: Appears intact  Comprehension WFL Expression Expression Primary Mode of Expression: Verbal Verbal Expression Overall Verbal Expression: Impaired Other Verbal  Expression Comments: high-level word-finding deficits Oral Motor Oral Motor/Sensory Function Overall Oral Motor/Sensory Function: Within functional limits Motor Speech Overall Motor Speech: Impaired (deficits in prosody) Phonation: Normal Resonance: Within functional limits Articulation: Impaired Intelligibility: Intelligibility reduced Word: 75-100% accurate Phrase: 75-100% accurate Sentence: 75-100% accurate Conversation: 75-100% accurate Motor Planning: Witnin functional limits  Care Tool Care Tool Cognition Expression of Ideas and Wants Expression of Ideas and Wants: Without difficulty (complex and basic) - expresses complex messages without difficulty and with speech that is clear and easy to understand   Understanding Verbal and Non-Verbal Content Understanding Verbal and Non-Verbal Content: Usually understands - understands most conversations, but misses some part/intent of message. Requires cues at times to understand   Memory/Recall Ability *first 3 days only Memory/Recall Ability *first 3 days only: Current season;Staff names and faces;That he or she is in a hospital/hospital unit     Short Term Goals: Week 1: SLP Short Term Goal 1 (Week 1): STGs=LTGs due to ELOS  Refer to Care Plan for Long Term Goals  Recommendations for other services: None   Discharge Criteria: Patient will be discharged from SLP if patient refuses treatment 3 consecutive times without medical reason, if treatment goals not met, if there is a change in medical status, if patient makes no progress towards goals or if patient is discharged from hospital.  The above assessment, treatment plan, treatment alternatives and goals were discussed and mutually agreed upon: by patient  Pervis Macintyre 02/05/2021, 3:12 PM

## 2021-02-05 NOTE — Progress Notes (Signed)
PROGRESS NOTE   Subjective/Complaints: Patient's chart reviewed- No issues reported overnight Vitals signs stable  Sitting up in chair, well dressed, coloring, has no complaints, slow speech  ROS:  Pt denies SOB, abd pain, CP, N/V/C/D, and vision changes, +slow speech   Objective:   No results found. No results for input(s): WBC, HGB, HCT, PLT in the last 72 hours. No results for input(s): NA, K, CL, CO2, GLUCOSE, BUN, CREATININE, CALCIUM in the last 72 hours.  Intake/Output Summary (Last 24 hours) at 02/05/2021 0831 Last data filed at 02/04/2021 1827 Gross per 24 hour  Intake 360 ml  Output --  Net 360 ml        Physical Exam: Vital Signs Blood pressure 136/67, pulse 62, temperature 98.3 F (36.8 C), resp. rate 18, height 5\' 1"  (1.549 m), weight 65.2 kg, SpO2 100 %. Gen: no distress, normal appearing HEENT: oral mucosa pink and moist, NCAT Cardio: Reg rate Chest: normal effort, normal rate of breathing Abd: soft, non-distended Ext: no edema Psych: pleasant, normal affect Musculoskeletal:        General: No swelling or tenderness. Normal range of motion.    Cervical back: Normal range of motion. Skin:    General: Skin is warm and dry. Neurological:    Mental Status: She is alert and oriented to person, place, and time.    Comments: Speech slow and measured. Mild left facial weakness with mild dysarthria. She is able to follow simple commands without difficulty. A little slow to process.  Right sided weakness with ataxia noted in both RUE and RLE. 4/5 RUE and 5/5 LUE. RLE 4/5, LLE 5/5.    Assessment/Plan: 1. Functional deficits which require 3+ hours per day of interdisciplinary therapy in a comprehensive inpatient rehab setting. Physiatrist is providing close team supervision and 24 hour management of active medical problems listed below. Physiatrist and rehab team continue to assess barriers to  discharge/monitor patient progress toward functional and medical goals  Care Tool:  Bathing    Body parts bathed by patient: Left arm, Chest, Abdomen, Right arm, Left upper leg, Right upper leg, Front perineal area, Buttocks, Right lower leg, Left lower leg, Face         Bathing assist Assist Level: Supervision/Verbal cueing     Upper Body Dressing/Undressing Upper body dressing   What is the patient wearing?: Bra, Pull over shirt    Upper body assist Assist Level: Set up assist    Lower Body Dressing/Undressing Lower body dressing      What is the patient wearing?: Underwear/pull up, Pants     Lower body assist Assist for lower body dressing: Supervision/Verbal cueing     Toileting Toileting    Toileting assist Assist for toileting: Supervision/Verbal cueing     Transfers Chair/bed transfer  Transfers assist     Chair/bed transfer assist level: Contact Guard/Touching assist     Locomotion Ambulation   Ambulation assist      Assist level: Minimal Assistance - Patient > 75% Assistive device: No Device Max distance: 166ft   Walk 10 feet activity   Assist     Assist level: Minimal Assistance - Patient > 75% Assistive device: No  Device   Walk 50 feet activity   Assist Walk 50 feet with 2 turns activity did not occur: Safety/medical concerns  Assist level: Minimal Assistance - Patient > 75% Assistive device: No Device    Walk 150 feet activity   Assist Walk 150 feet activity did not occur: Safety/medical concerns  Assist level: Minimal Assistance - Patient > 75% Assistive device: No Device    Walk 10 feet on uneven surface  activity   Assist Walk 10 feet on uneven surfaces activity did not occur: Safety/medical concerns         Wheelchair     Assist        Wheelchair assist level: Minimal Assistance - Patient > 75% Max wheelchair distance: 150    Wheelchair 50 feet with 2 turns activity    Assist         Assist Level: Minimal Assistance - Patient > 75%   Wheelchair 150 feet activity     Assist      Assist Level: Minimal Assistance - Patient > 75%   Blood pressure 136/67, pulse 62, temperature 98.3 F (36.8 C), resp. rate 18, height 5\' 1"  (1.549 m), weight 65.2 kg, SpO2 100 %.  Medical Problem List and Plan: 1.  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  Impaired mobility: continue Lovenox.              -antiplatelet therapy: ASA/plavix 3. Headache: Continue Tylenol prn  4. Mood: LCSW to follow for evaluation and support.              -antipsychotic agents: N/A 5. Neuropsych: This patient is capable of making decisions on her own behalf. 6. Skin/Wound Care: Routine pressure relief measures.  7. Fluids/Electrolytes/Nutrition:  Monitor I/O. Check CMET in am.  8. HTN: Monitor TID             --continue Lisinopril, coreg and Imdur.             Vitals:   02/04/21 1922 02/05/21 0508  BP: (!) 94/51 136/67  Pulse: 70 62  Resp: 18 18  Temp: 98.5 F (36.9 C) 98.3 F (36.8 C)  SpO2: 98% 100%    9. T2DM: Hgb A1c-7.6.  Monitor BS ac/hs and use SSI for elevated BS.                  --resume home dose metformin.             -adjust regimen as needed             -diabetic education  CBG (last 3)  Recent Labs    02/04/21 1628 02/04/21 2031 02/05/21 0555  GLUCAP 152* 135* 105*  Controlled 6/16  10 Seizure disorder: Stable on Keppra bid. 11. Hyperlipidemia: On Statin and Zetia.              --was on Praluent twice a month.  12. Recent bilateral cataract surgery: Completes eye drops for right eye in a week.  9. CAD s/p CABG 2014 : Monitor for symptoms with increase in activity             --On DAPT, Imdur, Lisinopril, coreg and statin (Praluent on hold)  14.  Anxiety d/o: Mood stable on Lexapro. Not using Xanax currently. 15.  OSA: Is compliant with CPAP use.  6/11- using CPAP this AM.  16. Constipation  6/12- stools hard- is main issue- will add Colace and maintain Miralax.  17. Recreational therapy: she is enjoying coloring.       LOS: 6 days A FACE TO FACE EVALUATION WAS PERFORMED  Clide Deutscher Bao Coreas 02/05/2021, 8:31 AM

## 2021-02-05 NOTE — Progress Notes (Signed)
Physical Therapy Session Note  Patient Details  Name: Samantha Huffman MRN: [id number] Date of Birth: 01-14-1956  Today's Date: 02/05/2021 PT Individual Time: 0923-1016 PT Individual Time Calculation (min): 53 min   Short Term Goals: Week 1:  PT Short Term Goal 1 (Week 1): Pt will trasnfer to and from bed with CGA and LRAD PT Short Term Goal 2 (Week 1): Pt will perform gait training with LRAD and CGA >172f PT Short Term Goal 3 (Week 1): Pt will complete Berg balance test  Skilled Therapeutic Interventions/Progress Updates:   Pt received sitting in recliner and agreeable to PT[facility]   Gait training with RW x 1252fand CGA from PT with cues for AD management to improved safety in turns and reduce ataxia in the RUE. Gait training then performed without AD x [id number] min assist and HHA on the R.  Dynamic gait training forward/reverse [id number] with RW and [id number] wth HHA. Min assist throughout for safety as wellas cues for step symmetry, increased step width and sustained erect posture.  Weave through 6 cones with RW and CCA, min assist   Dynamic balance training to perform reciprocal foot taps on 3 inch step 2 x 6 BLE with HHA on the R. Stepping to 1 of 3 targets 2 x 6 BLE with alternating BLE on 2nd bout and min assist with HHA progressing to min assist at trunk only. Cues for midline intermittently to prevent lateral lean to the R as well as increased BOS width in stance.  Stair management training with 1 rail support with BUE support and min assist for safety. Pt progressed from step to gait pattern to step-through patten. Mild increase in ataxia in the RLE noted with fatigue but no LOB.   Pt returned to room and performed stand pivot transfer to bed with min assist and no AD. Sit>supine completed without assist, and left supine in bed with call bell in reach and all needs met.         Therapy  Documentation Precautions:  Precautions Precautions: Fall Precaution Comments: ataxia, RLE/RUE Restrictions Weight Bearing Restrictions: No  Pain: Pain Assessment Pain Scale: 0-10 Pain Score: 0-No pain     Therapy/Group: Individual Therapy  AustiLorie Phenix/2022, 10:17 AM

## 2021-02-06 LAB — GLUCOSE, CAPILLARY
Glucose-Capillary: 106 mg/dL — ABNORMAL HIGH (ref 70–99)
Glucose-Capillary: 70 mg/dL (ref 70–99)
Glucose-Capillary: 97 mg/dL (ref 70–99)
Glucose-Capillary: 128 mg/dL — ABNORMAL HIGH (ref 70–99)

## 2021-02-06 MED ORDER — CARVEDILOL 3.125 MG PO TABS
3.1250 mg | ORAL_TABLET | Freq: Every day | ORAL | Status: DC
  Administered 2021-02-07 – 2021-02-11 (×5): 3.125 mg via ORAL
  Filled 2021-02-06 (×5): qty 1

## 2021-02-06 NOTE — Progress Notes (Signed)
Occupational Therapy Session Note  Patient Details  Name: Samantha Huffman MRN: [id number] Date of Birth: Apr 19, 1956  Today's Date: 02/06/2021 OT Individual Time: 1101-1201 OT Individual Time Calculation (min): 60 min    Short Term Goals: Week 1:  OT Short Term Goal 1 (Week 1): Pt will complete either walk-in or tub/shower transfer with min A + LRAD. OT Short Term Goal 2 (Week 1): Pt will maintain static standing balance for at least 5 min with close in prep for standing ADL. OT Short Term Goal 3 (Week 1): Pt will ind state 2 energy conservation strategies to incoporate into her daily routine upon DC. OT Short Term Goal 4 (Week 1): Pt will bathe full-body with CGA.  Skilled Therapeutic Interventions/Progress Updates:    Pt received in room in bed and consented to OT tx. Pt seen for ADL analysis as she was not yet dressed for session. Pt instructed to sit EOB and completed all aspects of dressing with setup, used RW for steadying assist when standing to manage LB clothing. Pt completed grooming sink side with SUP, donned socks and shoes while sitting EOB with setup and time. Pt wheeled down to therapy gym for time mgmt, instructed in 24 piece jumbo jigsaw puzzle while standing at elevated table to increase B hand manipulation, standing tolerance, and spatial awareness for ADLs and IADLs. Pt tolerated activity well and was able to remain standing for entire task. Pt then instructed to sit for [facility] training with tacks and cork board activity to improve coordination for ADLs. After tx, pt helped back to room, left up in recliner with alarm on and all needs met.   Therapy Documentation Precautions:  Precautions Precautions: Fall Precaution Comments: ataxia, RLE/RUE Restrictions Weight Bearing Restrictions: No  Pain: Pain Assessment Pain Scale: 0-10 Pain Score: 0-No pain   Therapy/Group: Individual Therapy  [name] 02/06/2021, 11:09 AM

## 2021-02-06 NOTE — Progress Notes (Signed)
Patient has home CPAP for use and self placed device.

## 2021-02-06 NOTE — Progress Notes (Signed)
PROGRESS NOTE   Subjective/Complaints: No complaints this morning Asks to be dressed by nursing so she is ready for her 11am therapy session- discussed with Benjie Karvonen  ROS:  Pt denies SOB, abd pain, CP, N/V/C/D, and vision changes, +slow speech   Objective:   No results found. No results for input(s): WBC, HGB, HCT, PLT in the last 72 hours. No results for input(s): NA, K, CL, CO2, GLUCOSE, BUN, CREATININE, CALCIUM in the last 72 hours.  Intake/Output Summary (Last 24 hours) at 02/06/2021 1033 Last data filed at 02/06/2021 0811 Gross per 24 hour  Intake 180 ml  Output --  Net 180 ml        Physical Exam: Vital Signs Blood pressure 97/60, pulse (!) 58, temperature 98.1 F (36.7 C), resp. rate 16, height 5\' 1"  (1.549 m), weight 65.2 kg, SpO2 98 %. Gen: no distress, normal appearing HEENT: oral mucosa pink and moist, NCAT Cardio: Bradycardia Chest: normal effort, normal rate of breathing Abd: soft, non-distended Ext: no edema Psych: pleasant, normal affect Musculoskeletal:        General: No swelling or tenderness. Normal range of motion.    Cervical back: Normal range of motion. Skin:    General: Skin is warm and dry. Neurological:    Mental Status: She is alert and oriented to person, place, and time.    Comments: Speech slow and measured. Mild left facial weakness with mild dysarthria. She is able to follow simple commands without difficulty. A little slow to process.  Right sided weakness with ataxia noted in both RUE and RLE. 4/5 RUE and 5/5 LUE. RLE 4/5, LLE 5/5.    Assessment/Plan: 1. Functional deficits which require 3+ hours per day of interdisciplinary therapy in a comprehensive inpatient rehab setting. Physiatrist is providing close team supervision and 24 hour management of active medical problems listed below. Physiatrist and rehab team continue to assess barriers to discharge/monitor patient progress  toward functional and medical goals  Care Tool:  Bathing    Body parts bathed by patient: Left arm, Chest, Abdomen, Right arm, Left upper leg, Right upper leg, Front perineal area, Buttocks, Right lower leg, Left lower leg, Face         Bathing assist Assist Level: Supervision/Verbal cueing     Upper Body Dressing/Undressing Upper body dressing   What is the patient wearing?: Bra, Pull over shirt    Upper body assist Assist Level: Set up assist    Lower Body Dressing/Undressing Lower body dressing      What is the patient wearing?: Underwear/pull up, Pants     Lower body assist Assist for lower body dressing: Supervision/Verbal cueing     Toileting Toileting    Toileting assist Assist for toileting: Supervision/Verbal cueing     Transfers Chair/bed transfer  Transfers assist     Chair/bed transfer assist level: Contact Guard/Touching assist     Locomotion Ambulation   Ambulation assist      Assist level: Minimal Assistance - Patient > 75% Assistive device: No Device Max distance: 161ft   Walk 10 feet activity   Assist     Assist level: Minimal Assistance - Patient > 75% Assistive device: No Device  Walk 50 feet activity   Assist Walk 50 feet with 2 turns activity did not occur: Safety/medical concerns  Assist level: Minimal Assistance - Patient > 75% Assistive device: No Device    Walk 150 feet activity   Assist Walk 150 feet activity did not occur: Safety/medical concerns  Assist level: Minimal Assistance - Patient > 75% Assistive device: No Device    Walk 10 feet on uneven surface  activity   Assist Walk 10 feet on uneven surfaces activity did not occur: Safety/medical concerns         Wheelchair     Assist Will patient use wheelchair at discharge?: No      Wheelchair assist level: Minimal Assistance - Patient > 75% Max wheelchair distance: 150    Wheelchair 50 feet with 2 turns activity    Assist         Assist Level: Minimal Assistance - Patient > 75%   Wheelchair 150 feet activity     Assist      Assist Level: Minimal Assistance - Patient > 75%   Blood pressure 97/60, pulse (!) 58, temperature 98.1 F (36.7 C), resp. rate 16, height 5\' 1"  (1.549 m), weight 65.2 kg, SpO2 98 %.  Medical Problem List and Plan: 1.  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  Impaired mobility: Continue Lovenox.              -antiplatelet therapy: ASA/plavix 3. Headache: Continue Tylenol prn  4. Mood: LCSW to follow for evaluation and support.              -antipsychotic agents: N/A 5. Neuropsych: This patient is capable of making decisions on her own behalf. 6. Skin/Wound Care: Routine pressure relief measures.  7. Fluids/Electrolytes/Nutrition:  Monitor I/O. Check CMET in am.  8. HTN: Monitor TID             --continue Lisinopril, coreg and Imdur.             Vitals:   02/05/21 2100 02/06/21 0533  BP:  97/60  Pulse: 84 (!) 58  Resp: 16 16  Temp:  98.1 F (36.7 C)  SpO2: 96% 98%    9. T2DM: Hgb A1c-7.6.  Monitor BS ac/hs and use SSI for elevated BS.                  --resume home dose metformin.             -adjust regimen as needed             -diabetic education  CBG (last 3)  Recent Labs    02/05/21 1648 02/05/21 2055 02/06/21 0617  GLUCAP 102* 131* 106*  Controlled 6/17  10 Seizure disorder: Stable on Keppra bid. 11. Hyperlipidemia: Continue Statin and Zetia.              --was on Praluent twice a month.  12. Recent bilateral cataract surgery: Completes eye drops for right eye in a week.  73. CAD s/p CABG 2014 : Monitor for symptoms with increase in activity             --Continue DAPT, Imdur, Lisinopril, coreg and statin (Praluent on hold)  14. Anxiety d/o:  Mood stable on Lexapro. Not using Xanax currently. 15.  OSA: Is compliant with CPAP use.  6/11- using CPAP this AM.  16. Constipation  6/12- stools hard- is main issue- will add Colace and maintain Miralax.  17. Recreational therapy: she is enjoying coloring.  18. Bradycardia: decrease Coreg to daily.       LOS: 7 days A FACE TO FACE EVALUATION WAS PERFORMED  Clide Deutscher Casey Fye 02/06/2021, 10:33 AM

## 2021-02-06 NOTE — Progress Notes (Signed)
Speech Language Pathology Daily Session Note  Patient Details  Name: Samantha Huffman MRN: [id number] Date of Birth: 12-Oct-1955  Today's Date: 02/06/2021 SLP Individual Time: 1300-1400 SLP Individual Time Calculation (min): 60 min  Short Term Goals: Week 1: SLP Short Term Goal 1 (Week 1): STGs=LTGs due to ELOS  Skilled Therapeutic Interventions:   Patient seen for skilled ST session focusing on aphasia and speech goals. She initially appeared drowsy and had trouble keeping her eyes open, however with activities, she became adequately alert and attentive. She completed confrontational line drawing naming task and was 76% accurate. She demonstrated awareness to errors for 80% of these errors and was able to select correct name in field of 4 choices with 90% accuracy. She required modA cues to name objects when function described. 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 Patient continues to benefit from skilled SLP intervention to maximize speech-language goals prior to discharge.  Pain Pain Assessment Pain Scale: 0-10 Pain Score: 0-No pain  Therapy/Group: Individual Therapy  Sonia Baller, MA, CCC-SLP Speech Therapy

## 2021-02-06 NOTE — Discharge Instructions (Addendum)
Inpatient Rehab Discharge Instructions  Samantha Huffman Discharge date and time: 02/11/21   Activities/Precautions/ Functional Status: Activity: no lifting, driving, or strenuous exercise till cleared by MD Diet: cardiac diet and diabetic diet. Need to increase fluid intake--drink at least 5 glasses of water daily Wound Care: none needed   Functional status:  ___ No restrictions     ___ Walk up steps independently __X_ 24/7 supervision/assistance   ___ Walk up steps with assistance ___ Intermittent supervision/assistance  ___ Bathe/dress independently ___ Walk with walker     __X_ Bathe/dress with assistance ___ Walk Independently    ___ Shower independently ___ Walk with assistance    ___ Shower with assistance _X__ No alcohol     ___ Return to work/school ________   Special Instructions: Monitor blood sugars 2-3 times a day and take results to PCP for evaluation.   STROKE/TIA DISCHARGE INSTRUCTIONS SMOKING Cigarette smoking nearly doubles your risk of having a stroke & is the single most alterable risk factor  If you smoke or have smoked in the last 12 months, you are advised to quit smoking for your health. Most of the excess cardiovascular risk related to smoking disappears within a year of stopping. Ask you doctor about anti-smoking medications [place] Quit Line: 1-800-QUIT NOW Free Smoking Cessation Classes [phone]  CHOLESTEROL Know your levels; limit fat & cholesterol in your diet  Lipid Panel     Component Value Date/Time   CHOL 123 01/21/2021 0508   CHOL 136 03/12/2019 1043   TRIG 118 01/21/2021 0508   HDL 42 01/21/2021 0508   HDL 46 03/12/2019 1043   CHOLHDL 2.9 01/21/2021 0508   VLDL 24 01/21/2021 0508   LDLCALC 57 01/21/2021 0508   LDLCALC 44 08/18/2020 0945     Many patients benefit from treatment even if their cholesterol is at goal. Goal: Total Cholesterol (CHOL) less than 160 Goal:  Triglycerides (TRIG) less than 150 Goal:  HDL greater than 40 Goal:  LDL  (LDLCALC) less than 100   BLOOD PRESSURE American Stroke Association blood pressure target is less that 120/80 mm/Hg  Your discharge blood pressure is:  BP: (!) 113/57 Monitor your blood pressure Limit your salt and alcohol intake Many individuals will require more than one medication for high blood pressure  DIABETES (A1c is a blood sugar average for last 3 months) Goal HGBA1c is under 7% (HBGA1c is blood sugar average for last 3 months)  Diabetes:     Lab Results  Component Value Date   HGBA1C 7.1 (H) 01/21/2021    Your HGBA1c can be lowered with medications, healthy diet, and exercise. Check your blood sugar as directed by your physician Call your physician if you experience unexplained or low blood sugars.  PHYSICAL ACTIVITY/REHABILITATION Goal is 30 minutes at least 4 days per week  Activity: No driving, Therapies: see above  Return to work:  Activity decreases your risk of heart attack and stroke and makes your heart stronger.  It helps control your weight and blood pressure; helps you relax and can improve your mood. Participate in a regular exercise program. Talk with your doctor about the best form of exercise for you (dancing, walking, swimming, cycling).  DIET/WEIGHT Goal is to maintain a healthy weight  Your discharge diet is:  Diet Order             Diet heart healthy/carb modified Room service appropriate? Yes; Fluid consistency: Thin  Diet effective now  liquids Your height is:  Height: 5\' 1"  (154.9 cm) Your current weight is: Weight: 143 lbs Your Body Mass Index (BMI) is:  BMI (Calculated): 27.17 Following the type of diet specifically designed for you will help prevent another stroke. Your goal weight is:  132 lbs Your goal Body Mass Index (BMI) is 19-24. Healthy food habits can help reduce 3 risk factors for stroke:  High cholesterol, hypertension, and excess weight.  RESOURCES Stroke/Support Group:  Call [phone]   STROKE EDUCATION  PROVIDED/REVIEWED AND GIVEN TO PATIENT Stroke warning signs and symptoms How to activate emergency medical system (call 911). Medications prescribed at discharge. Need for follow-up after discharge. Personal risk factors for stroke. Pneumonia vaccine given:  Flu vaccine given:  My questions have been answered, the writing is legible, and I understand these instructions.  I will adhere to these goals & educational materials that have been provided to me after my discharge from the hospital.      My questions have been answered and I understand these instructions. I will adhere to these goals and the provided educational materials after my discharge from the hospital.  Patient/Caregiver Signature _______________________________ Date __________  Clinician Signature _______________________________________ Date __________  Please bring this form and your medication list with you to all your follow-up doctor's appointments.

## 2021-02-06 NOTE — Progress Notes (Signed)
Physical Therapy Session Note  Patient Details  Name: Samantha Huffman MRN: [id number] Date of Birth: [birth year]  Today's Date: 02/06/2021 PT Individual Time: 4496-7591 PT Individual Time Calculation (min): 76 min   Short Term Goals: Week 1:  PT Short Term Goal 1 (Week 1): Pt will trasnfer to and from bed with CGA and LRAD PT Short Term Goal 2 (Week 1): Pt will perform gait training with LRAD and CGA >158ft PT Short Term Goal 3 (Week 1): Pt will complete Berg balance test  Skilled Therapeutic Interventions/Progress Updates:  Patient seated upright in recliner with BLE elevated on entrance to room. Patient alert and agreeable to PT session. Patient denied pain during session.  Therapeutic Activity: Bed Mobility: Patient performed sit-->supine with Mod I not requiring any vc/tc to complete and with no safety issues using bedrail for support. Transfers: Patient performed STS transfers with supervision and SPVT transfers without use of AD with CGA throughout session. Provided verbal cues fortechnique.  Gait Training:  Patient ambulated >230 feet without AD and with CGA to and from dayroom from pt's room. Demonstrated short, slightly ataxic stepping initially with improving stepping pattern throughout. Pt able to progress during ambulation to heel-to-toe step progression, equal stance time bilaterally and minor arm swing for brief periods. Provided vc/ tc to reach above technique throughout both bouts.  Neuromuscular Re-ed: NMR facilitated during session with focus on coordination for LE and dynamic gait/ balance. Pt guided in 4 square stepping without AD and without UE support. She is able to demonstrate decreasing ataxic movements with RLE with increasing confidence and self efficacy. VC provided initially for sequence of weight shifting to manage balance.   Pt progressed to ladder drill stepping one foot into each square. Slow pace with concentration through first pass back and forth with very  good balance and coordinated, smooth movement with no LOB. Second pass, pt self chooses fast pace with significant decrease in balance and coordinated movement Requiring up to Min  to maintain balance. Third pass with decreased pace in order to increase conscious practice with slow increase in pace throughout and improved results from learning. Then guided through lateral stepping with first pass performed with good control and CGA/ close supervision. Second pass performed with minisquat performed with both feet in each square and with one foot in each square back and forth. Good balance and control throughout. NMR performed for improvements in motor control and coordination, balance, sequencing, judgement, and self confidence/ efficacy in performing all aspects of mobility at highest level of independence.   Patient supine in bed at end of session with brakes locked, bed alarm set, and all needs within reach.     Therapy Documentation Precautions:  Precautions Precautions: Fall Precaution Comments: ataxia, RLE/RUE Restrictions Weight Bearing Restrictions: No  Therapy/Group: Individual Therapy  Alger Simons PT, DPT 02/06/2021, 5:13 PM

## 2021-02-07 LAB — GLUCOSE, CAPILLARY
Glucose-Capillary: 109 mg/dL — ABNORMAL HIGH (ref 70–99)
Glucose-Capillary: 62 mg/dL — ABNORMAL LOW (ref 70–99)
Glucose-Capillary: 73 mg/dL (ref 70–99)
Glucose-Capillary: 85 mg/dL (ref 70–99)
Glucose-Capillary: 95 mg/dL (ref 70–99)

## 2021-02-07 NOTE — Progress Notes (Signed)
Hypoglycemic Event  CBG: 62  Treatment: 4 oz juice/soda  Symptoms: None  Follow-up CBG: Time:1151 CBG Result:73  Possible Reasons for Event: Unknown  Comments/MD notified: Dr. Voncille Lo

## 2021-02-07 NOTE — Progress Notes (Signed)
Physical Therapy Weekly Progress Note  Patient Details  Name: Samantha Huffman MRN: [id number] Date of Birth: 24-Jun-1956  Beginning of progress report period: January 31, 2021 End of progress report period: February 07, 2021  Today's Date: 02/07/2021      Patient has met 3 of 3 short term goals.  Pt has made staedy progress towards LTG. Presently, pt has progressed to Supervision assist-CGA with RW and min assist without AD for gait, transfers and stair management. Pt has also performed bed mobility without assist for Sit<>supine and rolling R and L.   Patient continues to demonstrate the following deficits muscle weakness, decreased cardiorespiratoy endurance, ataxia and decreased coordination, ideational apraxia, and decreased sitting balance, decreased standing balance, decreased postural control, hemiplegia, and decreased balance strategies and therefore will continue to benefit from skilled PT intervention to increase functional independence with mobility.  Patient progressing toward long term goals..  Continue plan of care.  PT Short Term Goals Week 1:  PT Short Term Goal 1 (Week 1): Pt will trasnfer to and from bed with CGA and LRAD PT Short Term Goal 1 - Progress (Week 1): Met PT Short Term Goal 2 (Week 1): Pt will perform gait training with LRAD and CGA >128f PT Short Term Goal 2 - Progress (Week 1): Met PT Short Term Goal 3 (Week 1): Pt will complete Berg balance test PT Short Term Goal 3 - Progress (Week 1): Met Week 2:  PT Short Term Goal 1 (Week 2): STG=LTG due to ELOS  Skilled Therapeutic Interventions/Progress Updates:  Ambulation/gait training;Cognitive remediation/compensation;Balance/vestibular training;Community reintegration;Discharge planning;Disease management/prevention;DME/adaptive equipment instruction;Functional electrical stimulation;Functional mobility training;Neuromuscular re-education;Pain management;Patient/family education;Psychosocial support;Skin care/wound  management;Splinting/orthotics;Stair training;Therapeutic Activities;Therapeutic Exercise;UE/LE Strength taining/ROM;UE/LE Coordination activities;Visual/perceptual remediation/compensation;Wheelchair propulsion/positioning   Therapy Documentation Precautions:  Precautions Precautions: Fall Precaution Comments: ataxia, RLE/RUE Restrictions Weight Bearing Restrictions: No  Vital Signs: Therapy Vitals Temp: 98 F (36.7 C) Temp Source: Oral Pulse Rate: 62 Resp: 17 BP: 134/79 Oxygen Therapy SpO2: 99 % O2 Device: Room Air  ALorie Phenix6/18/2022, 8:29 AM

## 2021-02-07 NOTE — Progress Notes (Signed)
Patient has home CPAP at bedside and applies her mask when she is ready for bed.

## 2021-02-08 LAB — GLUCOSE, CAPILLARY
Glucose-Capillary: 121 mg/dL — ABNORMAL HIGH (ref 70–99)
Glucose-Capillary: 99 mg/dL (ref 70–99)
Glucose-Capillary: 106 mg/dL — ABNORMAL HIGH (ref 70–99)
Glucose-Capillary: 147 mg/dL — ABNORMAL HIGH (ref 70–99)

## 2021-02-08 NOTE — Progress Notes (Signed)
Physical Therapy Session Note  Patient Details  Name: Samantha Huffman MRN: [id number] Date of Birth: December 11, 1955  Today's Date: 02/08/2021 PT Individual Time: 1416-1500 PT Individual Time Calculation (min): 44 min   Short Term Goals: Week 2:  PT Short Term Goal 1 (Week 2): STG=LTG due to ELOS  Skilled Therapeutic Interventions/Progress Updates:     Pt received supine in bed and agrees to therapy. No complaint of pain. Supine to sit with bed features and cues on positioning. Pt dresses herself at EOB with set-up assistance and increased time. Stand step transfer to [facility] with CGA and no AD, with cues to not reach for furniture to decrease reliance on upper extremities for balance. WC transport to gym for time management. Pt ambulates x120' with CGA and cues to increase gait speed to decrease risk for falls, and increasing stride length to improve balance. Pt then performs x20 steps with L hand rail and CGA/minA, with cues on sequencing and hand placement for safety. Following seated rest break, pt performs additional gait activity to promote improved balance and step height. Pt tasked with stepping over 5 rows of orange cones, with PT providing minA for stability and to facilitate lateral weights shifts. Pt alternates stepping over with L foot and R foot. Performs x4 reps. WC transport back to room. Strand step transfer back to bed with CGA. Pt changes back into hospital gown and returns to supine. Left with alarm intact and all needs within reach.  Therapy Documentation Precautions:  Precautions Precautions: Fall Precaution Comments: ataxia, RLE/RUE Restrictions Weight Bearing Restrictions: No    Therapy/Group: Individual Therapy  Breck Coons, PT, DPT 02/08/2021, 3:20 PM

## 2021-02-08 NOTE — Progress Notes (Signed)
PROGRESS NOTE   Subjective/Complaints:  No problems overnite, RIght arm  still feels shaky Speech still slurred  ROS:  Pt denies SOB, abd pain, CP, N/V/C/D, and vision changes, +slow speech   Objective:   No results found. No results for input(s): WBC, HGB, HCT, PLT in the last 72 hours. No results for input(s): NA, K, CL, CO2, GLUCOSE, BUN, CREATININE, CALCIUM in the last 72 hours.  Intake/Output Summary (Last 24 hours) at 02/08/2021 1003 Last data filed at 02/08/2021 0844 Gross per 24 hour  Intake 600 ml  Output --  Net 600 ml         Physical Exam: Vital Signs Blood pressure 131/80, pulse 64, temperature 98.6 F (37 C), resp. rate 16, height 5\' 1"  (1.549 m), weight 65.2 kg, SpO2 100 %.  General: No acute distress Mood and affect are appropriate Heart: Regular rate and rhythm no rubs murmurs or extra sounds Lungs: Clear to auscultation, breathing unlabored, no rales or wheezes Abdomen: Positive bowel sounds, soft nontender to palpation, nondistended Extremities: No clubbing, cyanosis, or edema Skin: No evidence of breakdown, no evidence of rash  Musculoskeletal:        General: No swelling or tenderness. Normal range of motion.    Cervical back: Normal range of motion. Skin:    General: Skin is warm and dry. Neurological:    Mental Status: She is alert and oriented to person, place, and time.    Comments: Speech slow ataxic dysarthria Mild left facial weakness with mild dysarthria. She is able to follow simple commands without difficulty. A little slow to process.  Right sided weakness with ataxia noted in both RUE and RLE. 4/5 RUE and 5/5 LUE. RLE 4/5, LLE 5/5.    Assessment/Plan: 1. Functional deficits which require 3+ hours per day of interdisciplinary therapy in a comprehensive inpatient rehab setting. Physiatrist is providing close team supervision and 24 hour management of active medical problems  listed below. Physiatrist and rehab team continue to assess barriers to discharge/monitor patient progress toward functional and medical goals  Care Tool:  Bathing    Body parts bathed by patient: Left arm, Chest, Abdomen, Right arm, Left upper leg, Right upper leg, Front perineal area, Buttocks, Right lower leg, Left lower leg, Face         Bathing assist Assist Level: Supervision/Verbal cueing     Upper Body Dressing/Undressing Upper body dressing   What is the patient wearing?: Bra, Pull over shirt    Upper body assist Assist Level: Set up assist    Lower Body Dressing/Undressing Lower body dressing      What is the patient wearing?: Underwear/pull up, Pants     Lower body assist Assist for lower body dressing: Supervision/Verbal cueing     Toileting Toileting    Toileting assist Assist for toileting: Supervision/Verbal cueing     Transfers Chair/bed transfer  Transfers assist     Chair/bed transfer assist level: Contact Guard/Touching assist     Locomotion Ambulation   Ambulation assist      Assist level: Minimal Assistance - Patient > 75% Assistive device: No Device Max distance: 124ft   Walk 10 feet activity   Assist  Assist level: Minimal Assistance - Patient > 75% Assistive device: No Device   Walk 50 feet activity   Assist Walk 50 feet with 2 turns activity did not occur: Safety/medical concerns  Assist level: Minimal Assistance - Patient > 75% Assistive device: No Device    Walk 150 feet activity   Assist Walk 150 feet activity did not occur: Safety/medical concerns  Assist level: Minimal Assistance - Patient > 75% Assistive device: No Device    Walk 10 feet on uneven surface  activity   Assist Walk 10 feet on uneven surfaces activity did not occur: Safety/medical concerns         Wheelchair     Assist Will patient use wheelchair at discharge?: No      Wheelchair assist level: Minimal Assistance -  Patient > 75% Max wheelchair distance: 150    Wheelchair 50 feet with 2 turns activity    Assist        Assist Level: Minimal Assistance - Patient > 75%   Wheelchair 150 feet activity     Assist      Assist Level: Minimal Assistance - Patient > 75%   Blood pressure 131/80, pulse 64, temperature 98.6 F (37 C), resp. rate 16, height 5\' 1"  (1.549 m), weight 65.2 kg, SpO2 100 %.  Medical Problem List and Plan: 1.  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  Impaired mobility: Continue Lovenox.              -antiplatelet therapy: ASA/plavix 3. Headache: Continue Tylenol prn  4. Mood: LCSW to follow for evaluation and support.              -antipsychotic agents: N/A 5. Neuropsych: This patient is capable of making decisions on her own behalf. 6. Skin/Wound Care: Routine pressure relief measures.  7. Fluids/Electrolytes/Nutrition:  Monitor I/O. Check CMET in am.  8. HTN: Monitor TID             --continue Lisinopril, coreg and Imdur.             Vitals:   02/07/21 1922 02/08/21 0459  BP: 127/68 131/80  Pulse: 72 64  Resp: 18 16  Temp: 98.2 F (36.8 C) 98.6 F (37 C)  SpO2: 99% 100%   Controlled 6/19 9. T2DM: Hgb A1c-7.6.  Monitor BS ac/hs and use SSI for elevated BS.                  --resume home dose metformin.             -adjust regimen as needed             -diabetic education  CBG (last 3)  Recent Labs    02/07/21 1638 02/07/21 2118 02/08/21 0600  GLUCAP 85 95 121*   Controlled 6/19  10 Seizure disorder: Stable on Keppra bid. 11. Hyperlipidemia: Continue Statin and Zetia.              --was on Praluent twice a month.  12. Recent bilateral cataract surgery: Completes eye drops for right eye in a week.  21. CAD s/p CABG 2014 : Monitor for  symptoms with increase in activity             --  Continue DAPT, Imdur, Lisinopril, coreg and statin (Praluent on hold) 14. Anxiety d/o: Mood stable on Lexapro. Not using Xanax currently. 15.  OSA: Is compliant with CPAP use.  6/11- using CPAP this AM.  16. Constipation  6/12- stools hard- is main issue- will add Colace and maintain Miralax.  17. Recreational therapy: she is enjoying coloring.  18. Bradycardia: decrease Coreg to daily.       LOS: 9 days A FACE TO FACE EVALUATION WAS PERFORMED  Charlett Blake 02/08/2021, 10:03 AM

## 2021-02-09 LAB — GLUCOSE, CAPILLARY
Glucose-Capillary: 134 mg/dL — ABNORMAL HIGH (ref 70–99)
Glucose-Capillary: 90 mg/dL (ref 70–99)
Glucose-Capillary: 81 mg/dL (ref 70–99)
Glucose-Capillary: 131 mg/dL — ABNORMAL HIGH (ref 70–99)

## 2021-02-09 LAB — BASIC METABOLIC PANEL
Anion gap: 9 (ref 5–15)
BUN: 28 mg/dL — ABNORMAL HIGH (ref 8–23)
CO2: 22 mmol/L (ref 22–32)
Calcium: 9.7 mg/dL (ref 8.9–10.3)
Chloride: 108 mmol/L (ref 98–111)
Creatinine, Ser: 1.13 mg/dL — ABNORMAL HIGH (ref 0.44–1.00)
GFR, Estimated: 54 mL/min — ABNORMAL LOW (ref >60)
Glucose, Bld: 138 mg/dL — ABNORMAL HIGH (ref 70–99)
Potassium: 4.2 mmol/L (ref 3.5–5.1)
Sodium: 139 mmol/L (ref 135–145)

## 2021-02-09 LAB — CBC
HCT: 37.9 % (ref 36.0–46.0)
Hemoglobin: 12.7 g/dL (ref 12.0–15.0)
MCH: 31.9 pg (ref 26.0–34.0)
MCHC: 33.5 g/dL (ref 30.0–36.0)
MCV: 95.2 fL (ref 80.0–100.0)
Platelets: 182 10*3/uL (ref 150–400)
RBC: 3.98 MIL/uL (ref 3.87–5.11)
RDW: 12.3 % (ref 11.5–15.5)
WBC: 5.8 10*3/uL (ref 4.0–10.5)
nRBC: 0 % (ref 0.0–0.2)

## 2021-02-09 NOTE — Progress Notes (Signed)
PROGRESS NOTE   Subjective/Complaints:  Pt reports doing well- eating breakfast- no pain- slept well- says still sleepy- just woke up- LBM yesterday.   ROS:   Pt denies SOB, abd pain, CP, N/V/C/D, and vision changes   Objective:   No results found. Recent Labs    02/09/21 0554  WBC 5.8  HGB 12.7  HCT 37.9  PLT 182   Recent Labs    02/09/21 0554  NA 139  K 4.2  CL 108  CO2 22  GLUCOSE 138*  BUN 28*  CREATININE 1.13*  CALCIUM 9.7    Intake/Output Summary (Last 24 hours) at 02/09/2021 1926 Last data filed at 02/09/2021 1340 Gross per 24 hour  Intake 480 ml  Output --  Net 480 ml        Physical Exam: Vital Signs Blood pressure (!) 124/59, pulse 62, temperature 98.3 F (36.8 C), resp. rate (!) 21, height 5\' 1"  (1.549 m), weight 65.2 kg, SpO2 99 %.    General: awake, alert, appropriate, sitting up in bed eating; NAD HENT: conjugate gaze; oropharynx moist CV: regular rate; no JVD Pulmonary: CTA B/L; no W/R/R- good air movement GI: soft, NT, ND, (+)BS; hyperactive since eating Psychiatric: appropriate; slurred speech Neurological: Alert  Musculoskeletal:        General: No swelling or tenderness. Normal range of motion.    Cervical back: Normal range of motion. Skin:    General: Skin is warm and dry. Neurological:    Mental Status: She is alert and oriented to person, place, and time.    Comments: Speech slow ataxic dysarthria Mild left facial weakness with mild dysarthria. She is able to follow simple commands without difficulty. A little slow to process.  Right sided weakness with ataxia noted in both RUE and RLE. 4/5 RUE and 5/5 LUE. RLE 4/5, LLE 5/5.    Assessment/Plan: 1. Functional deficits which require 3+ hours per day of interdisciplinary therapy in a comprehensive inpatient rehab setting. Physiatrist is providing close team supervision and 24 hour management of active medical problems  listed below. Physiatrist and rehab team continue to assess barriers to discharge/monitor patient progress toward functional and medical goals  Care Tool:  Bathing    Body parts bathed by patient: Left arm, Chest, Abdomen, Right arm, Left upper leg, Right upper leg, Front perineal area, Buttocks, Right lower leg, Left lower leg, Face         Bathing assist Assist Level: Supervision/Verbal cueing     Upper Body Dressing/Undressing Upper body dressing   What is the patient wearing?: Bra, Pull over shirt    Upper body assist Assist Level: Set up assist    Lower Body Dressing/Undressing Lower body dressing      What is the patient wearing?: Underwear/pull up, Pants     Lower body assist Assist for lower body dressing: Supervision/Verbal cueing     Toileting Toileting    Toileting assist Assist for toileting: Supervision/Verbal cueing     Transfers Chair/bed transfer  Transfers assist     Chair/bed transfer assist level: Contact Guard/Touching assist     Locomotion Ambulation   Ambulation assist      Assist level: Minimal  Assistance - Patient > 75% Assistive device: No Device Max distance: 131ft   Walk 10 feet activity   Assist     Assist level: Minimal Assistance - Patient > 75% Assistive device: No Device   Walk 50 feet activity   Assist Walk 50 feet with 2 turns activity did not occur: Safety/medical concerns  Assist level: Minimal Assistance - Patient > 75% Assistive device: No Device    Walk 150 feet activity   Assist Walk 150 feet activity did not occur: Safety/medical concerns  Assist level: Minimal Assistance - Patient > 75% Assistive device: No Device    Walk 10 feet on uneven surface  activity   Assist Walk 10 feet on uneven surfaces activity did not occur: Safety/medical concerns         Wheelchair     Assist Will patient use wheelchair at discharge?: No      Wheelchair assist level: Minimal Assistance -  Patient > 75% Max wheelchair distance: 150    Wheelchair 50 feet with 2 turns activity    Assist        Assist Level: Minimal Assistance - Patient > 75%   Wheelchair 150 feet activity     Assist      Assist Level: Minimal Assistance - Patient > 75%   Blood pressure (!) 124/59, pulse 62, temperature 98.3 F (36.8 C), resp. rate (!) 21, height 5\' 1"  (1.549 m), weight 65.2 kg, SpO2 99 %.  Medical Problem List and Plan: 1.  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  Impaired mobility: Continue Lovenox.              -antiplatelet therapy: ASA/plavix 3. Headache: Continue Tylenol prn  4. Mood: LCSW to follow for evaluation and support.              -antipsychotic agents: N/A 5. Neuropsych: This patient is capable of making decisions on her own behalf. 6. Skin/Wound Care: Routine pressure relief measures.  7. Fluids/Electrolytes/Nutrition:  Monitor I/O. Check CMET in am.  8. HTN: Monitor TID             --continue Lisinopril, coreg and Imdur.             Vitals:   02/09/21 0315 02/09/21 1322  BP: 123/63 (!) 124/59  Pulse: 66 62  Resp: 17 (!) 21  Temp: 98.5 F (36.9 C) 98.3 F (36.8 C)  SpO2: 98% 99%   6/20- HR borderline bradycardia, con't regimen 9. T2DM: Hgb A1c-7.6.  Monitor BS ac/hs and use SSI for elevated BS.                  --resume home dose metformin.             -adjust regimen as needed             -diabetic education  CBG (last 3)  Recent Labs    02/09/21 0625 02/09/21 1149 02/09/21 1655  GLUCAP 134* 90 81  6/20- BG controlled- con't regimen  10 Seizure disorder: Stable on Keppra bid. 11. Hyperlipidemia: Continue Statin and Zetia.              --was on Praluent twice  a month.  12. Recent bilateral cataract surgery: Completes eye  drops for right eye in a week.  60. CAD s/p CABG 2014 : Monitor for symptoms with increase in activity             --Continue DAPT, Imdur, Lisinopril, coreg and statin (Praluent on hold) 14. Anxiety d/o: Mood stable on Lexapro. Not using Xanax currently. 15.  OSA: Is compliant with CPAP use.  6/11- using CPAP this AM.  16. Constipation  6/12- stools hard- is main issue- will add Colace and maintain Miralax.   6/20- LBM yesterday- con't regimen 17. Recreational therapy: she is enjoying coloring.  18. Bradycardia: decrease Coreg to daily.    6/20- HR 62 this AM- con't regimen    LOS: 10 days A FACE TO FACE EVALUATION WAS PERFORMED  Errin Whitelaw 02/09/2021, 7:26 PM

## 2021-02-09 NOTE — Progress Notes (Signed)
Physical Therapy Session Note  Patient Details  Name: Samantha Huffman MRN: [id number] Date of Birth: 08-13-1956  Today's Date: 02/09/2021 PT Individual Time: 2585-2778 PT Individual Time Calculation (min): 60 min   Short Term Goals: Week 2:  PT Short Term Goal 1 (Week 2): STG=LTG due to ELOS  Skilled Therapeutic Interventions/Progress Updates:  Patient seated in recliner on entrance to room. Patient alert and agreeable to PT session. Patient denied pain during session.  Therapeutic Activity: Bed Mobility: At end of session, pt performed sit--> supine with supervision. No cues required.  Transfers: Patient performed STS and SPVT transfers throughout session with close supervision/ intermittent CGA. Provided verbal cues for reaching back to arm rests.   Gait Training:  Patient ambulated >250 feet x2 using no AD with CGA/ Min A. Demonstrated short, quick steps bilaterally initially and improves to smooth gait pattern with improved heel-to-toe progression with vc/ tc/ visual cues.   Neuromuscular Re-ed: NMR facilitated during session with focus on dynamic gait, quality of gait, dynamic balance. Pt guided in ladder stepping with one step into each square with focus on weight shift and balance required for smooth movement pattern. Two instances of minor LOB to R side requiring Min A to maintain balance. Education provided re: stepping strategy and using ability to step laterally to catch self rather than fall no matter what the activity. Progressed to stepping over 6" hurdles and 8" cones with pt able to clear all with only one wobble of balance and good strategy of stepping for bilateral clearance.  NMR performed for improvements in motor control and coordination, balance, sequencing, judgement, and self confidence/ efficacy in performing all aspects of mobility at highest level of independence.   Patient supine in bed at end of session with brakes locked, bed alarm set, and all needs within reach.      Therapy Documentation Precautions:  Precautions Precautions: Fall Precaution Comments: ataxia, RLE/RUE Restrictions Weight Bearing Restrictions: No  Therapy/Group: Individual Therapy  Alger Simons PT, DPT 02/09/2021, 7:12 PM

## 2021-02-09 NOTE — Progress Notes (Signed)
Occupational Therapy Session Note  Patient Details  Name: Samantha Huffman MRN: [id number] Date of Birth: September 11, 1955  Today's Date: 02/09/2021 OT Individual Time: 1330-1400 OT Individual Time Calculation (min): 30 min    Short Term Goals: Week 1:  OT Short Term Goal 1 (Week 1): Pt will complete either walk-in or tub/shower transfer with min A + LRAD. OT Short Term Goal 2 (Week 1): Pt will maintain static standing balance for at least 5 min with close in prep for standing ADL. OT Short Term Goal 3 (Week 1): Pt will ind state 2 energy conservation strategies to incoporate into her daily routine upon DC. OT Short Term Goal 4 (Week 1): Pt will bathe full-body with CGA.  Skilled Therapeutic Interventions/Progress Updates:    Patient in bed, alert and ready for therapy session.  She denies pain and states that she has already completed toileting and adl activities.  Supine to sitting edge of bed with CS.  She is able to donn and tie shoes with increased time.  Sit to stand and short distance ambulation with RW to/from bed, w/c, mat table with min a.  Completed visual motor activities in unsupported sitting - ROM intact, she denies diplopia, accomodation WFL at slowed rate, VOR (vert and horiz) slow and with + nausea, functional activity with head turns and saccades completed with slowed rate and c/o strain/nausea.  [place] - occ ataxic movement right side but able to complete closed chain activities without difficulty, [place] to include untying knots and manipulation of objects with little difficulty.  She remained seated in w/c - hand off to PT for next session.    Therapy Documentation Precautions:  Precautions Precautions: Fall Precaution Comments: ataxia, RLE/RUE Restrictions Weight Bearing Restrictions: No  Therapy/Group: Individual Therapy  Carlos Levering 02/09/2021, 7:45 AM

## 2021-02-09 NOTE — Progress Notes (Signed)
Occupational Therapy Session Note  Patient Details  Name: Samantha Huffman MRN: [id number] Date of Birth: 03-06-1956  Today's Date: 02/09/2021 OT Individual Time: 1015-1130 OT Individual Time Calculation (min): 75 min    Skilled Therapeutic Interventions/Progress Updates:    Pt greeted in bed with no c/o pain. Agreeable to shower. Pt completed bathing (sit<stand from TTB), toileting (using elevated toilet), dressing (EOB at sit<stand level), and oral care/grooming tasks (standing at sink) during session. All functional transfers completed using RW at ambulatory level with supervision/cues for AD mgt/safety. Min cues to increase functional use of the Rt hand, pt with 1 episode of dropping wash cloth in the shower. Able to use seated figure 4 functionally without assistance and close supervision for dynamic standing balance overall. Vcs during dressing for safe sequencing of tasks (I.e. sit to don bra and don shoes before standing with regular socks). Note that she was able to incorporate the Rt hand when tying shoelaces + drawstring of her pants. OT assisted with plucking hair from chin/neck. Pt very appreciative. She transferred to bed at end of session and was left with all needs within reach and bed alarm set.   Therapy Documentation Precautions:  Precautions Precautions: Fall Precaution Comments: ataxia, RLE/RUE Restrictions Weight Bearing Restrictions: No  ADL: ADL Eating: Minimal assistance Where Assessed-Eating: Edge of bed Grooming: Supervision/safety Where Assessed-Grooming: Sitting at sink Upper Body Bathing: Supervision/safety Where Assessed-Upper Body Bathing: Sitting at sink Lower Body Bathing: Minimal assistance Where Assessed-Lower Body Bathing: Standing at sink Upper Body Dressing: Supervision/safety Where Assessed-Upper Body Dressing: Sitting at sink Lower Body Dressing: Minimal cueing Where Assessed-Lower Body Dressing: Standing at sink Toileting: Minimal  assistance Where Assessed-Toileting: Glass blower/designer: Psychiatric nurse Method: Arts development officer: Raised toilet seat, Grab bars Tub/Shower Transfer: Not assessed Social research officer, government: Not assessed     Therapy/Group: Individual Therapy  Samantha Huffman 02/09/2021, 12:40 PM

## 2021-02-09 NOTE — Progress Notes (Addendum)
Patient placed herself on home CPAP for tonight once more water was added.

## 2021-02-09 NOTE — Progress Notes (Signed)
Speech Language Pathology Daily Session Note  Patient Details  Name: Samantha Huffman MRN: [id number] Date of Birth: 09-01-1955  Today's Date: 02/09/2021 SLP Individual Time: 9102-8902 SLP Individual Time Calculation (min): 45 min  Short Term Goals: Week 1: SLP Short Term Goal 1 (Week 1): STGs=LTGs due to ELOS  Skilled Therapeutic Interventions: Skilled SLP treatment performed with focus on communication goals. Patient was perceived as >90% intelligible at conversation level with occasional need to repeat to enhance intelligibility with emphasis on over-articulation. SLP facilitated divergent naming task with Min A verbal cues. Incorporated more complex components (category with initial letter - e.g., tell me 5 items in the bathroom with begin with the letter T") with Min A verbal cues. Educated patient on speech and communication strategies including over-articulation, and description strategy. Patient displayed understanding through demonstration and teach back. Patient was left in her bed with alarm activated and all needs within reach. Continue with current plan of care.    Pain Pain Assessment Pain Scale: 0-10 Pain Score: 0-No pain  Therapy/Group: Individual Therapy  [name] 02/09/2021, 12:11 PM

## 2021-02-10 LAB — GLUCOSE, CAPILLARY
Glucose-Capillary: 99 mg/dL (ref 70–99)
Glucose-Capillary: 78 mg/dL (ref 70–99)
Glucose-Capillary: 142 mg/dL — ABNORMAL HIGH (ref 70–99)
Glucose-Capillary: 101 mg/dL — ABNORMAL HIGH (ref 70–99)

## 2021-02-10 NOTE — Progress Notes (Signed)
Occupational Therapy Discharge Summary  Patient Details  Name: Amairany Schumpert MRN: [id number] Date of Birth: 01/10/1956     Patient has met 12 of 12 long term goals due to improved activity tolerance, improved balance, postural control, functional use of  RIGHT upper and RIGHT lower extremity, improved attention, and improved coordination.  Patient to discharge at overall Supervision level.   Reasons goals not met: na   Recommendation:  Patient will benefit from ongoing skilled OT services in outpatient setting to continue to advance functional skills in the area of BADL, iADL, and Reduce care partner burden.   Reasons for discharge: treatment goals met  Patient/family agrees with progress made and goals achieved: Yes  OT Discharge Precautions/Restrictions  Precautions Precautions: Fall Precaution Comments: ataxia, RLE/RUE Restrictions Weight Bearing Restrictions: No   Pain Pain Assessment Pain Scale: 0-10 Pain Score: 0-No pain ADL ADL Eating: Independent Where Assessed-Eating: Edge of bed Grooming: Supervision/safety Where Assessed-Grooming: Standing at sink Upper Body Bathing: Supervision/safety Where Assessed-Upper Body Bathing: Standing at sink Lower Body Bathing: Supervision/safety Where Assessed-Lower Body Bathing: Standing at sink, Sitting at sink Upper Body Dressing: Modified independent (Device) Where Assessed-Upper Body Dressing: Edge of bed Lower Body Dressing: Supervision/safety Where Assessed-Lower Body Dressing: Edge of bed Toileting: Supervision/safety Where Assessed-Toileting: Glass blower/designer: Close supervision Armed forces technical officer Method: Counselling psychologist: Grab bars, Raised toilet seat Tub/Shower Transfer: Not assessed Social research officer, government: Close supervision Social research officer, government Method: Ambulating Vision Baseline Vision/History: Wears glasses;Cataracts Wears Glasses: Reading only Patient Visual Report: Blurring of  vision Vision Assessment?: Yes Eye Alignment: Within Functional Limits Ocular Range of Motion: Within Functional Limits Alignment/Gaze Preference: Within Defined Limits Tracking/Visual Pursuits: Able to track stimulus in all quads without difficulty Saccades: Decreased speed of saccadic movement Convergence: Within functional limits Perception  Perception: Within Functional Limits Cognition Arousal/Alertness: Awake/alert Orientation Level: Oriented X4 Safety/Judgment: Appears intact Sensation Sensation Hot/Cold: Appears Intact Coordination Gross Motor Movements are Fluid and Coordinated: No Fine Motor Movements are Fluid and Coordinated: No Coordination and Movement Description: ataxia on the R side UE and LE 9 Hole Peg Test: R = 48 sec, L = 36 sec      box and blocks:  R = 36, L = 50 Motor  Motor Motor: Hemiplegia;Ataxia;Motor apraxia Mobility  Bed Mobility Rolling Right: Independent Rolling Left: Independent Supine to Sit: Independent Sit to Supine: Independent Transfers Sit to Stand: Supervision/Verbal cueing  Trunk/Postural Assessment  Cervical Assessment Cervical Assessment: Within Functional Limits  Balance Static Sitting Balance Static Sitting - Level of Assistance: 7: Independent Dynamic Sitting Balance Dynamic Sitting - Level of Assistance: 7: Independent Static Standing Balance Static Standing - Level of Assistance: 5: Stand by assistance Dynamic Standing Balance Dynamic Standing - Level of Assistance: 5: Stand by assistance Extremity/Trunk Assessment RUE Assessment RUE Assessment: Within Functional Limits LUE Assessment LUE Assessment: Within Functional Limits   Carlos Levering 02/10/2021, 10:37 AM

## 2021-02-10 NOTE — Progress Notes (Signed)
Resting quietly so far this shift. No c/o pain or discomfort. CPAP intact, no problems at this time.

## 2021-02-10 NOTE — Progress Notes (Signed)
Speech Language Pathology Discharge Summary  Patient Details  Name: Samantha Huffman MRN: [id number] Date of Birth: 19-Nov-1955  Today's Date: 02/10/2021 SLP Individual Time: 1500-1530 SLP Individual Time Calculation (min): 30 min   Skilled Therapeutic Interventions: Skilled ST treatment performed with focus on cognitive-communication goals. During previous session, patient requested for additional intervention on word finding/communication strategies. Provided patient with word finding strategies handout to accompany training to maximize comprehension and recall of discussed strategies for home use. Provided education with emphasis on "description strategy", and "categorizing". Patient demonstrated use of description strategy with Min A verbal cues progressing to occasional cues during simulated opportunities. Patient demonstrated appropriate use of categories with occasional verbal cues. She also demonstrated carry over of other discussed strategies during instances of word finding difficulty during natural conversational exchange including first letter, gestures, and description with Min A verbal cues to overcome breakdown. Educated on compensatory memory strategies in which patient was able to recall and accurately teach back 2 beneficial strategies by the end of session (1. Write things down, 2. Consistently place items in the same location.) Patient was left in her bed with alarm activated and needs within reach.  Patient has met 4 of 4 long term goals.  Patient to discharge at overall Supervision level.  Reasons goals not met: NA    Clinical Impression/Discharge Summary: Patient demonstrated progress toward cognitive and communication goals and is discharging at overall supervision level. She exhibits occasional word finding difficulty in which she is able to overcome with additional time, and use of word finding strategies including description, and first letter cues which were observed to be the  most beneficial. Speech intelligibility is perceived as approximately 90% intelligible at conversation level with occasional need to repeat herself with emphasis on over articulation. Patient will benefit from continued SLP services to maximize communication skills and cognitive function and independence.    Care Partner:  Caregiver Able to Provide Assistance: Yes  Type of Caregiver Assistance: Cognitive;Physical  Recommendation:  Outpatient SLP  Rationale for SLP Follow Up: Maximize functional communication;Maximize cognitive function and independence   Equipment: NA   Reasons for discharge: Treatment goals met   Patient/Family Agrees with Progress Made and Goals Achieved: Yes    Burley Kopka T Marily Konczal 02/10/2021, 4:09 PM

## 2021-02-10 NOTE — Progress Notes (Signed)
PROGRESS NOTE   Subjective/Complaints:    ROS:   Pt denies SOB, abd pain, CP, N/V/C/D, and vision changes   Objective:   No results found. Recent Labs    02/09/21 0554  WBC 5.8  HGB 12.7  HCT 37.9  PLT 182    Recent Labs    02/09/21 0554  NA 139  K 4.2  CL 108  CO2 22  GLUCOSE 138*  BUN 28*  CREATININE 1.13*  CALCIUM 9.7     Intake/Output Summary (Last 24 hours) at 02/10/2021 0737 Last data filed at 02/10/2021 0735 Gross per 24 hour  Intake 720 ml  Output --  Net 720 ml         Physical Exam: Vital Signs Blood pressure 139/67, pulse 69, temperature 98 F (36.7 C), resp. rate 18, height 5\' 1"  (1.549 m), weight 65.2 kg, SpO2 99 %.   General: No acute distress Mood and affect are appropriate Heart: Regular rate and rhythm no rubs murmurs or extra sounds Lungs: Clear to auscultation, breathing unlabored, no rales or wheezes Abdomen: Positive bowel sounds, soft nontender to palpation, nondistended Extremities: No clubbing, cyanosis, or edema Skin: No evidence of breakdown, no evidence of rash    Musculoskeletal:        General: No swelling or tenderness. Normal range of motion.    Cervical back: Normal range of motion. Skin:    General: Skin is warm and dry. Neurological:    Mental Status: She is alert and oriented to person, place, and time.    Comments: Speech slow ataxic dysarthria Mild left facial weakness with mild dysarthria. She is able to follow simple commands without difficulty. A little slow to process.  Right sided weakness with ataxia noted in both RUE and RLE. 4/5 RUE and 5/5 LUE. RLE 4/5, LLE 5/5.    Assessment/Plan: 1. Functional deficits which require 3+ hours per day of interdisciplinary therapy in a comprehensive inpatient rehab setting. Physiatrist is providing close team supervision and 24 hour management of active medical problems listed below. Physiatrist and rehab  team continue to assess barriers to discharge/monitor patient progress toward functional and medical goals  Care Tool:  Bathing    Body parts bathed by patient: Left arm, Chest, Abdomen, Right arm, Left upper leg, Right upper leg, Front perineal area, Buttocks, Right lower leg, Left lower leg, Face         Bathing assist Assist Level: Supervision/Verbal cueing     Upper Body Dressing/Undressing Upper body dressing   What is the patient wearing?: Bra, Pull over shirt    Upper body assist Assist Level: Set up assist    Lower Body Dressing/Undressing Lower body dressing      What is the patient wearing?: Underwear/pull up, Pants     Lower body assist Assist for lower body dressing: Supervision/Verbal cueing     Toileting Toileting    Toileting assist Assist for toileting: Supervision/Verbal cueing     Transfers Chair/bed transfer  Transfers assist     Chair/bed transfer assist level: Contact Guard/Touching assist     Locomotion Ambulation   Ambulation assist      Assist level: Minimal Assistance - Patient >  75% Assistive device: No Device Max distance: 147ft   Walk 10 feet activity   Assist     Assist level: Minimal Assistance - Patient > 75% Assistive device: No Device   Walk 50 feet activity   Assist Walk 50 feet with 2 turns activity did not occur: Safety/medical concerns  Assist level: Minimal Assistance - Patient > 75% Assistive device: No Device    Walk 150 feet activity   Assist Walk 150 feet activity did not occur: Safety/medical concerns  Assist level: Minimal Assistance - Patient > 75% Assistive device: No Device    Walk 10 feet on uneven surface  activity   Assist Walk 10 feet on uneven surfaces activity did not occur: Safety/medical concerns         Wheelchair     Assist Will patient use wheelchair at discharge?: No      Wheelchair assist level: Minimal Assistance - Patient > 75% Max wheelchair distance:  150    Wheelchair 50 feet with 2 turns activity    Assist        Assist Level: Minimal Assistance - Patient > 75%   Wheelchair 150 feet activity     Assist      Assist Level: Minimal Assistance - Patient > 75%   Blood pressure 139/67, pulse 69, temperature 98 F (36.7 C), resp. rate 18, height 5\' 1"  (1.549 m), weight 65.2 kg, SpO2 99 %.  Medical Problem List and Plan: 1.  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  Impaired mobility: Continue Lovenox.              -antiplatelet therapy: ASA/plavix x 3 mo 3. Headache: Continue Tylenol prn  4. Mood: LCSW to follow for evaluation and support.              -antipsychotic agents: N/A 5. Neuropsych: This patient is capable of making decisions on her own behalf. 6. Skin/Wound Care: Routine pressure relief measures.  7. Fluids/Electrolytes/Nutrition:  Monitor I/O. Check CMET in am.  8. HTN: Monitor TID             --continue Lisinopril, coreg and Imdur.             Vitals:   02/09/21 2030 02/10/21 0527  BP: 118/65 139/67  Pulse: 75 69  Resp: 17 18  Temp: (!) 97.4 F (36.3 C) 98 F (36.7 C)  SpO2: 99% 99%   6/21 controlled  9. T2DM: Hgb A1c-7.6.  Monitor BS ac/hs and use SSI for elevated BS.                  --resume home dose metformin.             -adjust regimen as needed             -diabetic education  CBG (last 3)  Recent Labs    02/09/21 1655 02/09/21 2051 02/10/21 0553  GLUCAP 81 131* 99   6/21- BG controlled- con't regimen  10 Seizure disorder: Stable on Keppra bid. 11. Hyperlipidemia: Continue Statin and Zetia.              --was on Praluent twice a month.  12. Recent bilateral cataract surgery: Completes eye drops for right eye in a week.  67. CAD s/p CABG 2014 : Monitor for  symptoms with increase in activity             --Continue DAPT, Imdur, Lisinopril, coreg and statin (Praluent on hold) 14. Anxiety d/o: Mood stable on Lexapro. Not using Xanax currently. 15.  OSA: Is compliant with CPAP use.  6/11- using CPAP this AM.  16. Constipation  6/12- stools hard- is main issue- will add Colace and maintain Miralax.   6/20- LBM yesterday- con't regimen 17. Recreational therapy: she is enjoying coloring.  18. Bradycardia: decrease Coreg to daily.    6/21 controlled     LOS: 11 days A FACE TO [place] E Haylen Shelnutt 02/10/2021, 7:37 AM

## 2021-02-10 NOTE — Progress Notes (Signed)
Occupational Therapy Session Note  Patient Details  Name: Samantha Huffman MRN: [id number] Date of Birth: [birth year]  Today's Date: 02/10/2021 OT Individual Time: 0800-0900 OT Individual Time Calculation (min): 60 min    Short Term Goals: Week 1:  OT Short Term Goal 1 (Week 1): Pt will complete either walk-in or tub/shower transfer with min A + LRAD. OT Short Term Goal 2 (Week 1): Pt will maintain static standing balance for at least 5 min with close in prep for standing ADL. OT Short Term Goal 3 (Week 1): Pt will ind state 2 energy conservation strategies to incoporate into her daily routine upon DC. OT Short Term Goal 4 (Week 1): Pt will bathe full-body with CGA. Week 2:     Skilled Therapeutic Interventions/Progress Updates:    Patient in bed, alert and ready for therapy session.  She states that she is eager and ready for discharge to home tomorrow.  Supine to sitting edge of bed mod I.  She is able to donn slipper socks mod I.  Sit to stand and ambulation in room with RW CS to/from bed, w/c, toilet, shower bench.  Oral care and bathing completed at sink this session CS in stance, mod I seated.  Dressing to include bra, OH shirt, c-style jacket, underwear, pants, socks/shoes mod I when seated and CS in stance.  Completed UE reassessment for discharge - see discharge summary for details.  Reviewed and practiced reach and transport of items in kitchen environment - requires CS and min cues for safety.  Returned to bed at close of session, bed alarm set and call bell in hand.    Therapy Documentation Precautions:  Precautions Precautions: Fall Precaution Comments: ataxia, RLE/RUE Restrictions Weight Bearing Restrictions: No   Therapy/Group: Individual Therapy  Carlos Levering 02/10/2021, 7:29 AM

## 2021-02-10 NOTE — Progress Notes (Signed)
Patient ID: Samantha Huffman, female   DOB: July 06, 1956, 65 y.o.   MRN: [id number] Follow up with the patient regarding pending discharge. Patient notes she feels ready for discharge and daughter is prepared to provide needed care. Patient aware of medications and dietary modifications recommended and denies questions at present. Margarito Liner

## 2021-02-10 NOTE — Progress Notes (Signed)
Assisted pt. With setting up her home cpap. Water chamber filled and pt.has placed herself on. No issues at this time.

## 2021-02-10 NOTE — Progress Notes (Signed)
Inpatient Rehabilitation Care Coordinator Discharge Note  The overall goal for the admission was met for:   Discharge location: Yes, home  Length of Stay: Yes, 12 Days  Discharge activity level: Yes  Home/community participation: Yes  Services provided included: MD, RD, PT, OT, SLP, RN, CM, TR, Pharmacy, Neuropsych, and SW  Financial Services: Other: [employer]  Choices offered to/list presented to:pt  Follow-up services arranged: Outpatient: [facility]  Comments (or additional information): PT OT ST Rolling Walker  Patient/Family verbalized understanding of follow-up arrangements: Yes  Individual responsible for coordination of the follow-up plan: [name], [phone]  Confirmed correct DME delivered: Dyanne Iha 02/10/2021    Dyanne Iha

## 2021-02-10 NOTE — Progress Notes (Signed)
Patient ID: Samantha Huffman, female   DOB: 01/16/1956, 66 y.o.   MRN: [id number]  Rolling Walker Ordered through adapt.   [place], [place]

## 2021-02-11 LAB — GLUCOSE, CAPILLARY: Glucose-Capillary: 105 mg/dL — ABNORMAL HIGH (ref 70–99)

## 2021-02-11 MED ORDER — DOCUSATE SODIUM 100 MG PO CAPS: 100.0000 mg | ORAL_CAPSULE | Freq: Every day | ORAL | 0 refills | Status: DC

## 2021-02-11 MED ORDER — CLOPIDOGREL BISULFATE 75 MG PO TABS: 75.0000 mg | ORAL_TABLET | Freq: Every day | ORAL | 0 refills | Status: DC

## 2021-02-11 MED ORDER — PANTOPRAZOLE SODIUM 40 MG PO TBEC: 1.0000 | DELAYED_RELEASE_TABLET | Freq: Every day | ORAL | 0 refills | Status: DC

## 2021-02-11 MED ORDER — TOPIRAMATE 25 MG PO TABS: 75.0000 mg | ORAL_TABLET | Freq: Two times a day (BID) | ORAL | 0 refills | Status: DC

## 2021-02-11 MED ORDER — POLYETHYLENE GLYCOL 3350 17 G PO PACK: 17.0000 g | PACK | Freq: Two times a day (BID) | ORAL | 0 refills | Status: DC

## 2021-02-11 MED ORDER — METFORMIN HCL ER 500 MG PO TB24: 500.0000 mg | ORAL_TABLET | Freq: Every day | ORAL | 0 refills | Status: DC

## 2021-02-11 MED ORDER — ASPIRIN EC 325 MG PO TBEC: 325.0000 mg | DELAYED_RELEASE_TABLET | Freq: Every day | ORAL | 0 refills | Status: DC

## 2021-02-11 MED ORDER — ACETAMINOPHEN 325 MG PO TABS: 325.0000 mg | ORAL_TABLET | ORAL | Status: AC | PRN

## 2021-02-11 MED ORDER — CARVEDILOL 3.125 MG PO TABS: 3.1250 mg | ORAL_TABLET | Freq: Every day | ORAL | 0 refills | Status: DC

## 2021-02-11 MED ORDER — ATORVASTATIN CALCIUM 80 MG PO TABS: 80.0000 mg | ORAL_TABLET | Freq: Every day | ORAL | 0 refills | Status: DC

## 2021-02-11 MED ORDER — LISINOPRIL 20 MG PO TABS: 20.0000 mg | ORAL_TABLET | Freq: Every day | ORAL | 0 refills | Status: DC

## 2021-02-11 MED ORDER — ESCITALOPRAM OXALATE 10 MG PO TABS: 10.0000 mg | ORAL_TABLET | Freq: Every day | ORAL | 0 refills | Status: AC

## 2021-02-11 MED ORDER — ISOSORBIDE MONONITRATE ER 30 MG PO TB24: 15.0000 mg | ORAL_TABLET | Freq: Every day | ORAL | 0 refills | Status: DC

## 2021-02-11 MED ORDER — EZETIMIBE 10 MG PO TABS: 10.0000 mg | ORAL_TABLET | Freq: Every day | ORAL | 0 refills | Status: DC

## 2021-02-11 MED ORDER — LEVETIRACETAM 500 MG PO TABS: 500.0000 mg | ORAL_TABLET | Freq: Two times a day (BID) | ORAL | 0 refills | Status: DC

## 2021-02-11 NOTE — Progress Notes (Signed)
Patient discharged off of unit with all belongings. Discharge papers/instructions explained by physician assistant to family. Patient and family have no further questions at time of discharge. No complications noted at this time.  Samantha Huffman.

## 2021-02-11 NOTE — Discharge Summary (Signed)
Physician Discharge Summary  Patient ID: Samantha Huffman MRN: [id number] DOB/AGE: 1956-03-27 65 y.o.  Admit date: 01/30/2021 Discharge date: 02/11/2021  Discharge Diagnoses:  Principal Problem:   Cerebellar stroke [facility]) Active Problems:   Diabetes mellitus without complication ([place])   Seizures ([place])   Essential hypertension   Tobacco abuse   GERD (gastroesophageal reflux disease)   Discharged Condition: good  Significant Diagnostic Studies:   Labs:  Basic Metabolic Panel: BMP Latest Ref Rng & Units 02/09/2021 02/02/2021 01/31/2021  Glucose 70 - 99 mg/dL 138(H) 140(H) 159(H)  BUN 8 - 23 mg/dL 28(H) 22 23  Creatinine 0.44 - 1.00 mg/dL 1.13(H) 1.11(H) 1.17(H)  BUN/Creat Ratio 6 - 22 (calc) - - -  Sodium 135 - 145 mmol/L 139 137 140  Potassium 3.5 - 5.1 mmol/L 4.2 4.2 4.7  Chloride 98 - 111 mmol/L 108 107 109  CO2 22 - 32 mmol/L 22 23 21(L)  Calcium 8.9 - 10.3 mg/dL 9.7 9.5 9.4     CBC: Recent Labs  Lab 02/09/21 0554  WBC 5.8  HGB 12.7  HCT 37.9  MCV 95.2  PLT 182    CBG: Recent Labs  Lab 02/10/21 0553 02/10/21 1105 02/10/21 1605 02/10/21 2306 02/11/21 0611  GLUCAP 99 78 142* 101* 105*    Brief HPI:   Samantha Huffman is a 65 y.o. female with history of CAD, HTN, T2DM, OSA, seizure disorder was admitted on 01/20/2021 with onset of right-sided weakness with unsteady gait of 2 to 3 days.  She was found to have occlusion of right-SCA and acute/subacute right cerebellar stroke with small infarcts in right PCA and left thalamus.  Dr. Erlinda Hong question vertebral dissection as cause of stroke and repeat CT head x2 showed stable cerebellar and thalamic infarct without hemorrhage or mass-effect.  Aspirin was increased to 325 mg recommendation of DAPT x3 months followed by low-dose aspirin Plavix per home regiment.  She continued to have deficits due to right-sided weakness with ataxia and sensory deficits, dizziness as well as visual deficits affecting ADLs and mobility.  CIR was  recommended due to functional decline.   Hospital Course: Samantha Huffman was admitted to rehab 01/30/2021 for inpatient therapies to consist of PT, ST and OT at least three hours five days a week. Past admission physiatrist, therapy team and rehab RN have worked together to provide customized collaborative inpatient rehab.her blood pressures were monitored on TID basis and has been reasonably controlled on current regimen.  She was noted to have bradycardia therefore Coreg was decreased to once a day.  She has been compliant with CPAP use during his stay.  She was maintained on increased dose aspirin with Plavix and is tolerating this without side effects.    Her mood has been stable and she has been seizure-free during her stay.  She has been educated on nicotine cessation and is willing to quit.  She was educated on use of nicotine patches/taper after discharge.  Follow-up CBC shows H&H and platelets to be stable.  Serial check of electrolytes did show some prerenal azotemia and she was encouraged to increase fluid intake at discharge.  Blood sugars were monitored ac/hs CBG checks and SSI was use prn for tighter BS control.  These have been currently controlled on diet alone.  Right ataxia is improving and she has made steady gains during her stay.  She currently requires supervision and will continue to receive follow-up therapy after discharge.   Rehab course: During patient's stay in rehab weekly team conferences were  held to monitor patient's progress, set goals and discuss barriers to discharge. At admission, patient required min assist with basic ADL tasks and mod assist with mobility.  She exhibited mild to moderate ataxic dysarthria as well as high-level word finding deficits and delay in processing as well as memory. She  has had improvement in activity tolerance, balance, postural control as well as ability to compensate for deficits. She has had improvement in functional use RUE  and RLE as well as  improvement in awareness. She is able to complete ADL tasks with supervision. She requires supervision for transfers and to ambulate to 25 feet with rolling walker.She requires supervision for occasional word finding deficits and speech intelligibility is 90% accurate conversation level.  Family education was completed with daughter.   Discharge disposition: 01-Home or Self Care  Diet: Heart healthy/carb modified.  Special Instructions: 1.  No driving or strenuous activity till cleared by MD.  Discharge Instructions     Ambulatory referral to Neurology   Complete by: As directed    An appointment is requested in approximately: 4 weeks--stroke follow up   Ambulatory referral to Physical Medicine Rehab   Complete by: As directed    1-2 weeks TC appt      Allergies as of 02/11/2021       Reactions   Depakote [divalproex Sodium] Other (See Comments)   Hyperammonemia   Other Other (See Comments)   Penicillins Hives, Other (See Comments), Rash   Other reaction(s): GI intolerance Has patient had a PCN reaction causing immediate rash, facial/tongue/throat swelling, SOB or lightheadedness with hypotension: YES Has patient had a PCN reaction causing severe rash involving mucus membranes or skin necrosis: NO Has patient had a PCN reaction that required hospitalizationNO Has patient had a PCN reaction occurring within the last 10 years: NO If all of the above answers are "NO", then may proceed with Cephalosporin use. Other reaction(s): GI Upset (intolerance) Other reaction(s): GI intolerance Has patient had a PCN reaction causing immediate rash, facial/tongue/throat swelling, SOB or lightheadedness with hypotension: YES Has patient had a PCN reaction causing severe rash involving mucus membranes or skin necrosis: NO Has patient had a PCN reaction that required hospitalizationNO Has patient had a PCN reaction occurring within the last 10 years: NO If all of the above answers are "NO", then  may proceed with Cephalosporin use.        Medication List     STOP taking these medications    ALPRAZolam 0.25 MG tablet Commonly known as: XANAX   furosemide 20 MG tablet Commonly known as: LASIX   ketorolac 0.5 % ophthalmic solution Commonly known as: ACULAR   ofloxacin 0.3 % ophthalmic solution Commonly known as: OCUFLOX   Potassium Citrate 15 MEQ (1620 MG) Tbcr   prednisoLONE acetate 1 % ophthalmic suspension Commonly known as: PRED FORTE       TAKE these medications    acetaminophen 325 MG tablet Commonly known as: TYLENOL Take 1-2 tablets (325-650 mg total) by mouth every 4 (four) hours as needed for mild pain. What changed:  how much to take reasons to take this   aspirin EC 325 MG tablet Take 1 tablet (325 mg total) by mouth daily. NEED TO PURCHASE THIS OVER THE COUNTER What changed:  medication strength how much to take additional instructions Notes to patient: Note increase in dose--continue this dose thorough 03/23/21 then decrease to baby ASA or as per neurology recommendations.    atorvastatin 80 MG tablet Commonly known as:  LIPITOR Take 1 tablet (80 mg total) by mouth daily. Notes to patient: Start tomorrow   carvedilol 3.125 MG tablet Commonly known as: COREG Take 1 tablet (3.125 mg total) by mouth daily. Start taking on: February 12, 2021 What changed:  medication strength how much to take when to take this   clopidogrel 75 MG tablet Commonly known as: PLAVIX Take 1 tablet (75 mg total) by mouth daily.   docusate sodium 100 MG capsule Commonly known as: COLACE Take 1 capsule (100 mg total) by mouth daily. PURCHASE THIS OVER THE COUNTER Start taking on: February 12, 2021   escitalopram 10 MG tablet Commonly known as: LEXAPRO Take 1 tablet (10 mg total) by mouth at bedtime.   ezetimibe 10 MG tablet Commonly known as: ZETIA Take 1 tablet (10 mg total) by mouth at bedtime.   isosorbide mononitrate 30 MG 24 hr tablet Commonly known  as: IMDUR Take 0.5 tablets (15 mg total) by mouth daily. Start taking on: February 12, 2021 What changed:  medication strength how much to take   levETIRAcetam 500 MG tablet Commonly known as: KEPPRA Take 1 tablet (500 mg total) by mouth 2 (two) times daily.   lisinopril 20 MG tablet Commonly known as: ZESTRIL Take 1 tablet (20 mg total) by mouth daily.   metFORMIN 500 MG 24 hr tablet Commonly known as: GLUCOPHAGE-XR Take 1 tablet (500 mg total) by mouth daily with breakfast.   Nicotine 21-14-7 MG/24HR Kit Use as directed   nitroGLYCERIN 0.4 MG SL tablet Commonly known as: NITROSTAT Place 1 tablet (0.4 mg total) under the tongue every 5 (five) minutes x 3 doses as needed for chest pain.   pantoprazole 40 MG tablet Commonly known as: PROTONIX Take 1 tablet (40 mg total) by mouth daily.   polyethylene glycol 17 g packet Commonly known as: MIRALAX / GLYCOLAX Take 17 g by mouth 2 (two) times daily. PURCHASE THIS OVER THE COUNTER   Praluent 75 MG/ML Soaj Generic drug: Alirocumab INJECT CONTENTS OF 1 PEN INTO THE SKIN EVERY 14 DAYS Notes to patient: Can resume at home   topiramate 25 MG tablet Commonly known as: TOPAMAX Take 3 tablets (75 mg total) by mouth 2 (two) times daily.        Follow-up Information     Kirsteins, Luanna Salk, MD Follow up.   Specialty: Physical Medicine and Rehabilitation Why: office will call you with follow up Contact information: [place] [address] [phone]         [facility]. Schedule an appointment as soon as possible for a visit.   Contact information: [address]     [place] [address] [phone]        Alroy Dust, L.Marlou Sa, MD. Call today.   Specialty: Family Medicine Why: for post hospital follow up Contact information: [address] [place] [address] [phone]                 Signed: Bary Leriche 02/11/2021, 5:34 PM

## 2021-02-11 NOTE — Progress Notes (Signed)
Physical Therapy Discharge Summary  Patient Details  Name: Samantha Huffman MRN: [id number] Date of Birth: Jan 18, 1956  Today's Date: 02/10/2021 PT Individual Time:  1101-1201 and 1401-1445  PT Individual Time Calculation (min): 44 min and 60 min     Patient has met 7 of 7 long term goals due to improved activity tolerance, improved balance, improved postural control, increased strength, functional use of  right upper extremity and right lower extremity, and improved coordination.  Patient to discharge at an ambulatory level Supervision.   Patient's care partner is independent to provide the necessary physical and cognitive assistance at discharge.  Reasons goals not met: all goals met  Recommendation:  Patient will benefit from ongoing skilled PT services in outpatient setting to continue to advance safe functional mobility, address ongoing impairments in strength, coordination, balance, activity tolerance, cognition, safety awareness, and to minimize fall risk.  Equipment: RW  Reasons for discharge: treatment goals met and discharge from hospital  Patient/family agrees with progress made and goals achieved: Yes  PT Discharge Precautions:  Precautions Precautions: Fall Precaution Comments: ataxia, RLE/RUE Restrictions Weight Bearing Restrictions: No Pain Pain Assessment Pain Scale: 0-10 Pain Score: 0-No pain Vision/Perception  Baseline Vision/History: Wears glasses;Cataracts Wears Glasses: Reading only Patient Visual Report: Blurring of vision Eye Alignment: Within Functional Limits Ocular Range of Motion: Within Functional Limits Alignment/Gaze Preference: Within Defined Limits Tracking/Visual Pursuits: Able to track stimulus in all quads without difficulty Convergence: Within functional limits Perception  Perception: Within Functional Limits  Praxis: Impaired Praxis Impairment Details: Motor planning Praxis-Other Comments: ataxic movements intermittently in RUE and RLE  with fatigue and with decreased focus of motor control  Cognition Overall Cognitive Status: Impaired/Different from baseline Arousal/Alertness: Awake/alert Orientation Level: Oriented X4 Awareness: Impaired Awareness Impairment: Emergent Impairment Safety/Judgment: Appears intact  Sensation Light Touch: Appears Intact Coordination Gross Motor Movements are Fluid and Coordinated: No Fine Motor Movements are Fluid and Coordinated: No Motor  Motor Motor: Hemiplegia;Ataxia;Motor apraxia   Mobility Bed Mobility Rolling Right: Independent Rolling Left: Independent Supine to Sit: Independent Sit to Supine: Independent Transfers Sit to Stand: Supervision/Verbal cueing   Locomotion  Locomotion: Yes Gait Assistance: Contractor (Feet):225 Feet Assistive device:Rolling Haematologist Assistance Details: Verbal cues for technique; Verbal cues for gait pattern Gait Gait Pattern: Impaired Gait pattern: Ataxic; Narrow base of support; Decreased step length - left; Decreased step length - right Gait velocity: Decreased, 0.363ms Stairs: Yes Stairs Assistance: Supervision/ Verbal Cueing; Contact Guard/ Touching assist Stair Management technique: One rail Right Number of Stairs: 24 Height of Stairs: 6 Ramp: Supervision/ Verbal Cueing Curb: Contact Guard/ Touching assist Wheelchair Mobility: No   Trunk/Postural Assessment  Cervical Assessment Cervical Assessment: Within Functional Limits Thoracic Assessment: exceptions to [facility] Assessment: Exceptions to [facility] Control: Within Functional Limits    Balance Berg Balance Test Total Score: 40 Static Sitting Balance Static Sitting - Level of Assistance: 7: Independent Dynamic Sitting Balance Dynamic Sitting - Level of Assistance: 7: Independent Static Standing Balance Static Standing - Level of Assistance: 5: Stand by assistance Dynamic Standing Balance Dynamic Standing - Level of Assistance: 5:  Stand by assistance  Extremity Assessment      RLE Assessment RLE Assessment: Within Functional Limits General Strength Comments: grossly 4/5 to 4+/5 proximal to distal LLE Assessment LLE Assessment: Within Functional Limits  General Strength Comments: grossly 4+/5 to 5/5   Therapy Session Note: Session 1: Patient supine in bed on entrance to room. Patient alert and agreeable to PT session. Patient  denied pain during session.  Therapeutic Activity: Bed Mobility: Patient performed supine <> sit with Mod I. No vc required. Transfers: Patient performed STS and SPVT transfers throughout session with Mod I with and without use of RW. Provided education on use of RW at home especially when supervision is not readily available or nearby.  Car transfer performed with supervision at height set to daughter's car height. Initially pt asks if she can step into car as she used to but pt educated to choose safe option for now until ataxic movements decrease in order to increase her overall safety. Perfoms using RW with good technique and minimal vc.  Gait Training:  Patient ambulated 225 feet using new RW that has been adjusted for her height with supervision. Demonstrated short ataxic steps initially and provided vc/ tc for relaxed but upright posture, longer steps, and heel-to-toe progression with improved quality of gait.  Pt able to climb 24 steps in stairwell continuously using R side HR with overall supervision. She chooses correct leading  for ascending and descending. Tries descent forward and with side stepping to choose safe option once she is at her home steps and can decide when she sees the width of the steps which option will be safest. States either her dtr or neighbor will be there to assist with steps.  Patient supine in bed at end of session with brakes locked, bed alarm set, and all needs within reach.  Session 2: Patient supine in bed on entrance to room. Patient alert and  agreeable to PT session. Patient denied pain during session.  Gait Training:  Patient ambulated 100' x1/ 120' x2 using no AD with CGA. Continued short, ataxic steps when initiating requiring vc for lengthening steps, improving upright posture, and using heel-to-toe progression.  Neuromuscular Re-ed: NMR facilitated during session with focus on outcome measures related to gait and balance. Pt guided in TUG and Berg Balance. TUG completed with average score of 17.51sec using no AD with light CGA. Berg Balance showed significant improvement with increase in score from 25 to 40/ 56. NMR performed for improvements in motor control and coordination, balance, sequencing, judgement, and self confidence/ efficacy in performing all aspects of mobility at highest level of independence.   Patient supine in bed at end of session with brakes locked, bed alarm set, and all needs within reach. Pt oriented as to time and start of next session with SLP.    Alger Simons 02/10/2021, 8:28 PM

## 2021-02-11 NOTE — Progress Notes (Signed)
PROGRESS NOTE   Subjective/Complaints:  Ready for d/c has practiced steps with therapy , has 3 flights to enter   ROS:   Pt denies SOB, abd pain, CP, N/V/C/D, and vision changes   Objective:   No results found. Recent Labs    02/09/21 0554  WBC 5.8  HGB 12.7  HCT 37.9  PLT 182    Recent Labs    02/09/21 0554  NA 139  K 4.2  CL 108  CO2 22  GLUCOSE 138*  BUN 28*  CREATININE 1.13*  CALCIUM 9.7     Intake/Output Summary (Last 24 hours) at 02/11/2021 0814 Last data filed at 02/11/2021 0730 Gross per 24 hour  Intake 956 ml  Output --  Net 956 ml         Physical Exam: Vital Signs Blood pressure 128/66, pulse 60, temperature 97.9 F (36.6 C), temperature source Oral, resp. rate 18, height 5\' 1"  (1.549 m), weight 65.2 kg, SpO2 100 %.  General: No acute distress Mood and affect are appropriate Heart: Regular rate and rhythm no rubs murmurs or extra sounds Lungs: Clear to auscultation, breathing unlabored, no rales or wheezes Abdomen: Positive bowel sounds, soft nontender to palpation, nondistended  Musculoskeletal:        General: No swelling or tenderness. Normal range of motion.    Cervical back: Normal range of motion. Skin:    General: Skin is warm and dry. Neurological:    Mental Status: She is alert and oriented to person, place, and time.    Comments: Speech slow ataxic dysarthria Mild left facial weakness with mild dysarthria. She is able to follow simple commands without difficulty. A little slow to process.  Right sided weakness with ataxia noted in both RUE and RLE. 4/5 RUE and 5/5 LUE. RLE 4/5, LLE 5/5.    Assessment/Plan: 1. 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  Care Tool:  Bathing    Body parts bathed by patient: Left arm, Chest, Abdomen, Right arm, Left upper leg, Right upper leg, Front perineal area, Buttocks, Right lower leg, Left lower leg, Face          Bathing assist Assist Level: Supervision/Verbal cueing     Upper Body Dressing/Undressing Upper body dressing   What is the patient wearing?: Bra, Pull over shirt    Upper body assist Assist Level: Independent    Lower Body Dressing/Undressing Lower body dressing      What is the patient wearing?: Underwear/pull up, Pants     Lower body assist Assist for lower body dressing: Supervision/Verbal cueing     Toileting Toileting    Toileting assist Assist for toileting: Supervision/Verbal cueing     Transfers Chair/bed transfer  Transfers assist     Chair/bed transfer assist level: Supervision/Verbal cueing     Locomotion Ambulation   Ambulation assist      Assist level: Supervision/Verbal cueing Assistive device: Walker-rolling Max distance: 296ft   Walk 10 feet activity   Assist     Assist level: Supervision/Verbal cueing Assistive device: Walker-rolling   Walk 50 feet activity   Assist Walk 50 feet with 2 turns activity did not occur: Safety/medical concerns  Assist  level: Supervision/Verbal cueing Assistive device: Walker-rolling    Walk 150 feet activity   Assist Walk 150 feet activity did not occur: Safety/medical concerns  Assist level: Supervision/Verbal cueing Assistive device: Walker-rolling    Walk 10 feet on uneven surface  activity   Assist Walk 10 feet on uneven surfaces activity did not occur: Safety/medical concerns   Assist level: Contact Guard/Touching assist Assistive device: Aeronautical engineer Will patient use wheelchair at discharge?: No   Wheelchair activity did not occur: N/A  Wheelchair assist level: Minimal Assistance - Patient > 75% Max wheelchair distance: 150    Wheelchair 50 feet with 2 turns activity    Assist    Wheelchair 50 feet with 2 turns activity did not occur: N/A   Assist Level: Minimal Assistance - Patient > 75%   Wheelchair 150 feet activity      Assist  Wheelchair 150 feet activity did not occur: N/A   Assist Level: Minimal Assistance - Patient > 75%   Blood pressure 128/66, pulse 60, temperature 97.9 F (36.6 C), temperature source Oral, resp. rate 18, height 5\' 1"  (1.549 m), weight 65.2 kg, SpO2 100 %.  Medical Problem List and Plan: 1.  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  Impaired mobility: Continue Lovenox.              -antiplatelet therapy: ASA/plavix x 3 mo 3. Headache: Continue Tylenol prn  4. Mood: LCSW to follow for evaluation and support.              -antipsychotic agents: N/A 5. Neuropsych: This patient is capable of making decisions on her own behalf. 6. Skin/Wound Care: Routine pressure relief measures.  7. Fluids/Electrolytes/Nutrition:  Monitor I/O. Check CMET in am.  8. HTN: Monitor TID             --continue Lisinopril, coreg and Imdur.             Vitals:   02/10/21 2103 02/11/21 0511  BP: (!) 147/61 128/66  Pulse: 77 60  Resp: 18 18  Temp: 97.8 F (36.6 C) 97.9 F (36.6 C)  SpO2: 100% 100%   6/22 controlled  9. T2DM: Hgb A1c-7.6.  Monitor BS ac/hs and use SSI for elevated BS.                  --resume home dose metformin.             -adjust regimen as needed             -diabetic education  CBG (last 3)  Recent Labs    02/10/21 1605 02/10/21 2306 02/11/21 0611  GLUCAP 142* 101* 105*   6/22  BG controlled- con't regimen  10 Seizure disorder: Stable on Keppra bid. 11. Hyperlipidemia: Continue Statin and Zetia.              --was on Praluent twice a month.  12. Recent bilateral cataract surgery: Completes eye drops for right eye in a week.  61. CAD s/p CABG 2014 : Monitor for symptoms with increase in activity             --  Continue DAPT, Imdur,  Lisinopril, coreg and statin (Praluent on hold) 14. Anxiety d/o: Mood stable on Lexapro. Not using Xanax currently. 15.  OSA: Is compliant with CPAP use.  6/11- using CPAP this AM.  16. Constipation  6/12- stools hard- is main issue- will add Colace and maintain Miralax.   6/20- LBM yesterday- con't regimen 17. Recreational therapy: she is enjoying coloring.  18. Bradycardia: decrease Coreg to daily.     6/22 controlled     LOS: 12 days A FACE TO [place] E Laurinda Carreno 02/11/2021, 8:14 AM

## 2021-02-19 ENCOUNTER — Other Ambulatory Visit: Payer: Self-pay

## 2021-02-19 ENCOUNTER — Ambulatory Visit: Payer: Medicare HMO | Admitting: Physical Therapy

## 2021-02-19 ENCOUNTER — Ambulatory Visit: Payer: Medicare HMO

## 2021-02-19 ENCOUNTER — Ambulatory Visit: Payer: Medicare HMO | Attending: Physician Assistant | Admitting: Occupational Therapy

## 2021-02-19 ENCOUNTER — Encounter: Payer: Self-pay | Admitting: Occupational Therapy

## 2021-02-19 DIAGNOSIS — R41840 Attention and concentration deficit: Secondary | ICD-10-CM | POA: Diagnosis present

## 2021-02-19 DIAGNOSIS — R278 Other lack of coordination: Secondary | ICD-10-CM | POA: Diagnosis present

## 2021-02-19 DIAGNOSIS — R41841 Cognitive communication deficit: Secondary | ICD-10-CM | POA: Diagnosis present

## 2021-02-19 DIAGNOSIS — R41842 Visuospatial deficit: Secondary | ICD-10-CM

## 2021-02-19 DIAGNOSIS — M6281 Muscle weakness (generalized): Secondary | ICD-10-CM | POA: Diagnosis present

## 2021-02-19 DIAGNOSIS — R471 Dysarthria and anarthria: Secondary | ICD-10-CM | POA: Insufficient documentation

## 2021-02-19 DIAGNOSIS — R270 Ataxia, unspecified: Secondary | ICD-10-CM | POA: Insufficient documentation

## 2021-02-19 DIAGNOSIS — R2681 Unsteadiness on feet: Secondary | ICD-10-CM | POA: Diagnosis present

## 2021-02-19 DIAGNOSIS — R4701 Aphasia: Secondary | ICD-10-CM

## 2021-02-19 DIAGNOSIS — I6789 Other cerebrovascular disease: Secondary | ICD-10-CM | POA: Diagnosis present

## 2021-02-19 DIAGNOSIS — I69318 Other symptoms and signs involving cognitive functions following cerebral infarction: Secondary | ICD-10-CM | POA: Insufficient documentation

## 2021-02-19 DIAGNOSIS — R2689 Other abnormalities of gait and mobility: Secondary | ICD-10-CM

## 2021-02-19 NOTE — Therapy (Signed)
[facility] [address] [place], [place], [address] Phone: [phone]   Fax:  [phone]  Speech Language Pathology Evaluation  Patient Details  Name: Samantha Huffman MRN: [id number] Date of Birth: 12-27-1955 Referring Provider (SLP): Lauraine Rinne, [place]   Encounter Date: 02/19/2021   End of Session - 02/19/21 1340     Visit Number 1    Number of Visits 25    Date for SLP Re-Evaluation 05/20/21    SLP Start Time 0848    SLP Stop Time  0930    SLP Time Calculation (min) 42 min    Activity Tolerance Patient tolerated treatment well;            Past Medical History:  Diagnosis Date   Carotid artery stenosis    1-39% bilateral stenosis by dopplers 08/2020   Coronary artery disease    a. s/p CABG 10/2012.  cath 05/2016 with moderate LM stenosis, mild LCx and RCA stenosis and occluded LAD with patent LIMA to LAD, free radial to OM.  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   Dyslipidemia    Family history of adverse reaction to anesthesia    mother and sister ponv   Generalized convulsive epilepsy without mention of intractable epilepsy    GERD (gastroesophageal reflux disease)    History of kidney stones    Hypertension    Memory deficit    slight   Migraine    "controlled on daily RX " (06/11/2016)   Nephrolithiasis 07/30/2015   S/p lithotripsy x 3 and right and left ureter stents   OSA (obstructive sleep apnea)    cpap    PONV (postoperative nausea and vomiting)    Seizures ([place])    "controlled w/daily RX; started in 2012; dr thinks they might be from the migraines; last sz was early part of 2016" (06/11/2016)   Type II diabetes mellitus ([place])    metformin   Vasovagal syncope 07/30/2015   2011    Past Surgical History:  Procedure Laterality Date   CARDIAC CATHETERIZATION  2014; 06/11/2016   CARDIAC CATHETERIZATION N/A 06/11/2016   Procedure: Left Heart Cath and Cors/Grafts Angiography;  Surgeon:  Sherren Mocha, MD;  Location: [facility];  Service: Cardiovascular;  Laterality: N/A;   CESAREAN SECTION     CORONARY ANGIOPLASTY     CORONARY ARTERY BYPASS GRAFT  March, 2014   LIMA to LAD, left radial to LCx x 2   CYSTOSCOPY W/ URETERAL STENT PLACEMENT Left 01/16/2019   Procedure: CYSTOSCOPY WITH RETROGRADE PYELOGRAM/URETERAL LEFT STENT PLACEMENT;  Surgeon: Ardis Hughs, MD;  Location: [facility];  Service: Urology;  Laterality: Left;   CYSTOSCOPY/URETEROSCOPY/HOLMIUM LASER/STENT PLACEMENT Left 01/24/2019   Procedure: CYSTOSCOPY, LEFT URETEROSCOPY, HOLMIUM LASER, STONE REMOVAL AND STENT EXCHANGE;  Surgeon: Ardis Hughs, MD;  Location: [facility];  Service: Urology;  Laterality: Left;   LITHOTRIPSY  X 3   RIGHT/LEFT HEART CATH AND CORONARY/GRAFT ANGIOGRAPHY N/A 09/08/2018   Procedure: RIGHT/LEFT HEART CATH AND CORONARY/GRAFT ANGIOGRAPHY;  Surgeon: Martinique, Peter M, MD;  Location: [facility];  Service: Cardiovascular;  Laterality: N/A;   TOTAL ABDOMINAL HYSTERECTOMY     ovaries took before hysterectomy   TUBAL LIGATION     URETERAL STENT PLACEMENT      There were no vitals filed for this visit.   Subjective Assessment - 02/19/21 1103     Subjective "She said - for example-  ice - cream. I could - use - 'parlor'  for - others  - helping." (pt, describing the use of compensations for anomia)    Patient is accompained by: Family member   Kia - daughter   Currently in Pain? No/denies                SLP Evaluation [facility] - 02/19/21 1103       SLP Visit Information   SLP Received On 02/19/21    Referring Provider (SLP) Lauraine Rinne, PA    Onset Date 01-20-21    Medical Diagnosis CVA      Subjective   Patient/Family Stated Goal Improve communication with family and community      General Information   HPI Pt is a 65 y.o. F who presented 01/20/21 with dizziness and weakness. MRI showed 3 cm acute infarct right superior cerebellum, smaller acute infarct right  PICA territory, small acute infarct left thalamus. Significant PMH: HTN, CABG, epilepsy, nicotine use. She had ST on acute, and also on CIR. ST notes state pt with anomia, memory, and cog deficits. Pt is not managing her meds at this time, and did so prior to CVA.    Mobility Status PT eval scheduled for today. See PT note.      Balance Screen   Has the patient fallen in the past 6 months No      Prior Functional Status   Cognitive/Linguistic Baseline Within functional limits     Lives With Family   pt was living indpendently prior to CVA   Available Support Family    Vocation On disability      Cognition   Overall Cognitive Status Impaired/Different from baseline    Area of Impairment Memory;Awareness;Attention    Attention Comments Pt had difficulty focusing long enough to be able to figure out how many weeks she was in the hospital, using a calendar as a visual cue. After mod-max extended time she was able to tell SLP "23 days" but unable to convert into approx # of weeks.    Memory Decreased short-term memory    Awareness Comments pt did not meniton halting nature of her speech when asked about her deficits. This could be a demonstration of her memory deficit.      Auditory Comprehension   Overall Auditory Comprehension Appears within functional limits for tasks assessed      Verbal Expression   Overall Verbal Expression Impaired    Naming Impairment    Confrontation --   17/30 on [place] - odds (below WNL)   Effective Techniques Semantic cues;Phonemic cues    Other Verbal Expression Comments Pt had more diffculty with language fluency in situations requiring more complex linguistic demands. For example, her explanation of anomia strategies req'd SLP min-mod questioning cues and some inferencing by SLP to comprehend [place]'s message.      Oral Motor/Sensory Function   Overall Oral Motor/Sensory Function Oral motor function was not tested today due to time and pt's masked  oral and nasal area. It will be completed on the first therapy session      Motor Speech   Overall Motor Speech Impaired    Respiration Within functional limits    Phonation Normal    Resonance Within functional limits    Articulation Impaired    Intelligibility Intelligibility reduced    Conversation 75-100% accurate    Motor Planning Not tested   to be assessed     Standardized Assessments   Standardized Assessments  [place] Naming Test-2nd edition    [place] Naming  Test-2nd edition  17/30 with odds (Mean is 28.7 and 27.13 with standard deviations of 1.8 and 2.06, respectively, in two separate studies assessing validity of odds/evens assessment). This places pt below 2 standard deviations below the mean for confrontation naming.                             SLP Education - 02/19/21 1339     Education Details eval findings for anomia, compensastory strategies for anomia,  possible therapy goals, will assess cognition next session    Person(s) Educated Patient;Child(ren)    Methods Explanation;Verbal cues    Comprehension Verbalized understanding;Verbal cues required;Need further instruction              SLP Short Term Goals - 02/19/21 1350       SLP SHORT TERM GOAL #1   Title pt will engage in cognitive linguistic and speech strength/coordination testing and goals added as necessary    Time 2    Period Weeks    Status New    Target Date 03/05/21      SLP SHORT TERM GOAL #2   Title using compensations, Jorita will remind family when she needs to take meds (pt is now dependent upon family) 80% of opportunities between 3 sessions    Time 4    Period Weeks    Status New    Target Date 03/20/21      SLP SHORT TERM GOAL #3   Title pt will name 10 items in a simple category with rare verbal cues x3 sessions    Time 4    Period Weeks    Status New    Target Date 03/20/21      SLP SHORT TERM GOAL #4   Title pt will independently use anomia compensations  in 5 minutes simple conversation x3 sessions    Time 4    Period Weeks    Status New    Target Date 03/20/21      SLP SHORT TERM GOAL #5   Title pt will exhibit 100% intelligibility in 5 minutes simple conversation using speech compensations, x3 sessions    Time 4    Period Weeks    Status New    Target Date 03/20/21      Additional Short Term Goals   Additional Short Term Goals Yes      SLP SHORT TERM GOAL #6   Title Roselie will complete a speech HEP with rare min A x2 sessions    Time 4    Period Weeks    Status New    Target Date 03/20/21              SLP Long Term Goals - 02/19/21 1359       SLP LONG TERM GOAL #1   Title pt will complete speech HEP independently x3 sessions    Time 8    Period Weeks    Status New    Target Date 04/17/21      SLP LONG TERM GOAL #2   Title in 10 minutes simple conversation pt will exhibit functional fluid speech using compensations in 2 sessions    Time 8    Period Weeks    Status New    Target Date 04/17/21      SLP LONG TERM GOAL #3   Title in 10 minutes mod complex conversation pt will exhibit functional fluid speech using compensations in 2 sessions  Time 12    Period Weeks    Status New    Target Date 05/15/21      SLP LONG TERM GOAL #4   Title pt will complete VNeST and SFA tasks with rare min A in 3 sessions    Time 8    Period Weeks    Status New    Target Date 04/17/21      SLP LONG TERM GOAL #5   Title Ch[place] will exhibit functional word-finding using compensations in 10 mintues mod complex conversation in 3 sessions    Time 12    Period Weeks    Status New    Target Date 05/15/21      Additional Long Term Goals   Additional Long Term Goals Yes      SLP LONG TERM GOAL #6   Title [place] will independently take meds for 2 weeks as reported by Blanch [place] or family    Time 12    Period Weeks    Status New    Target Date 05/15/21              Plan - 02/19/21 1341     Clinical Impression Statement  Yannely presents today with anomia as diagnosed by [facility], and s/sx cognitive communication deficits in attention, awareness, and memory. Additionally, she has s/sx of dysluent speech which will be assessed next session - SLP suspects dysarthria or apraxia of speech. Cognition, and details of pt's speech planning/strength will be assessed next session and goals added as necessary. Pt used to manage her own meds and live independently and now lives with daughter. She named one favorite flower but had difficulty naming the 2nd. She used description strategy with cues to do so from SLP. She would benefit from skilled ST targeting her deficit areas.    Speech Therapy Frequency 2x / week    Duration 12 weeks    Treatment/Interventions Language facilitation;Cueing hierarchy;SLP instruction and feedback;Cognitive reorganization;Compensatory strategies;Internal/external aids;Patient/family education;Functional tasks;Environmental controls;Oral motor exercises    Potential to Achieve Goals Good    Consulted and Agree with Plan of Care Patient             Patient will benefit from skilled therapeutic intervention in order to improve the following deficits and impairments:   Aphasia  Dysarthria  Cognitive communication deficit    Problem List Patient Active Problem List   Diagnosis Date Noted   Cerebellar stroke ([place]) 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   Acute cerebrovascular accident (CVA) ([place]) 01/20/2021   Acute CVA (cerebrovascular accident) ([place]) 01/20/2021   GERD (gastroesophageal reflux disease)    Coronary artery disease    Depression 01/06/2020   Pneumonia 01/05/2020   History of chest pain 09/26/2018   Dyspnea on exertion 09/08/2018   Chest pain, rule out acute myocardial infarction 10/30/2017   Edema 01/25/2017   Tobacco abuse 06/10/2016   Headache 09/15/2015   OSA (obstructive sleep apnea) 07/30/2015   Nephrolithiasis 07/30/2015   Vasovagal syncope 07/30/2015   Carotid artery stenosis,  asymptomatic 07/30/2015   CAD (coronary artery disease) 08/22/2013   Essential hypertension 08/22/2013   Diabetes mellitus without complication ([place])    Dyslipidemia    Seizures ([place])    Generalized convulsive epilepsy ([place]) 03/22/2013   Memory deficit 03/22/2013    [place] ,[place], [place]  02/19/2021, 4:05 PM  [facility] [address] [place], [place], [address] Phone: [phone]   Fax:  [phone]  Name: Samantha Huffman MRN: [id number] Date of Birth: July 30, 1956

## 2021-02-19 NOTE — Patient Instructions (Addendum)
We will work on your word finding, your flow of your speech, and your thinking skills. I will assess your thinking skills next time.

## 2021-02-19 NOTE — Therapy (Signed)
[facility] [address] [place], [place], [address] Phone: [phone]   Fax:  [phone]  Physical Therapy Evaluation  Patient Details  Name: Samantha Huffman MRN: [id number] Date of Birth: 12/30/1955 Referring Provider (PT): Lauraine Rinne, PA-C   Encounter Date: 02/19/2021   PT End of Session - 02/19/21 2002     Visit Number 1    Number of Visits 25    Date for PT Re-Evaluation 05/15/21    Authorization Type [employer]    Authorization Time Period 02-19-21 - 05-21-21    Progress Note Due on Visit 10    PT Start Time 0850    PT Stop Time 0930    PT Time Calculation (min) 40 min    Activity Tolerance Patient tolerated treatment well    Behavior During Therapy [facility] for tasks assessed/performed             Past Medical History:  Diagnosis Date   Carotid artery stenosis    1-39% bilateral stenosis by dopplers 08/2020   Coronary artery disease    a. s/p CABG 10/2012.  cath 05/2016 with moderate LM stenosis, mild LCx and RCA stenosis and occluded LAD with patent LIMA to LAD, free radial to OM.  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   Dyslipidemia    Family history of adverse reaction to anesthesia    mother and sister ponv   Generalized convulsive epilepsy without mention of intractable epilepsy    GERD (gastroesophageal reflux disease)    History of kidney stones    Hypertension    Memory deficit    slight   Migraine    "controlled on daily RX " (06/11/2016)   Nephrolithiasis 07/30/2015   S/p lithotripsy x 3 and right and left ureter stents   OSA (obstructive sleep apnea)    cpap    PONV (postoperative nausea and vomiting)    Seizures ([place])    "controlled w/daily RX; started in 2012; dr thinks they might be from the migraines; last sz was early part of 2016" (06/11/2016)   Type II diabetes mellitus ([place])    metformin   Vasovagal syncope 07/30/2015   2011    Past Surgical History:  Procedure  Laterality Date   CARDIAC CATHETERIZATION  2014; 06/11/2016   CARDIAC CATHETERIZATION N/A 06/11/2016   Procedure: Left Heart Cath and Cors/Grafts Angiography;  Surgeon: Sherren Mocha, MD;  Location: [place] [facility];  Service: Cardiovascular;  Laterality: N/A;   CESAREAN SECTION     CORONARY ANGIOPLASTY     CORONARY ARTERY BYPASS GRAFT  March, 2014   LIMA to LAD, left radial to LCx x 2   CYSTOSCOPY W/ URETERAL STENT PLACEMENT Left 01/16/2019   Procedure: CYSTOSCOPY WITH RETROGRADE PYELOGRAM/URETERAL LEFT STENT PLACEMENT;  Surgeon: Ardis Hughs, MD;  Location: [facility];  Service: Urology;  Laterality: Left;   CYSTOSCOPY/URETEROSCOPY/HOLMIUM LASER/STENT PLACEMENT Left 01/24/2019   Procedure: CYSTOSCOPY, LEFT URETEROSCOPY, HOLMIUM LASER, STONE REMOVAL AND STENT EXCHANGE;  Surgeon: Ardis Hughs, MD;  Location: [facility];  Service: Urology;  Laterality: Left;   LITHOTRIPSY  X 3   RIGHT/LEFT HEART CATH AND CORONARY/GRAFT ANGIOGRAPHY N/A 09/08/2018   Procedure: RIGHT/LEFT HEART CATH AND CORONARY/GRAFT ANGIOGRAPHY;  Surgeon: Martinique, Peter M, MD;  Location: [place] [facility];  Service: Cardiovascular;  Laterality: N/A;   TOTAL ABDOMINAL HYSTERECTOMY     ovaries took before hysterectomy   TUBAL LIGATION     URETERAL STENT PLACEMENT  There were no vitals filed for this visit.    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  Pt has RW which was left in the car because she wants to walk without it (holding onto daughter's arm for assistance)    Patient is accompained by: Family member   Kia   Pertinent History Carotid artery stenosis, HTN, epilepsy with seizure disorder - last seizure was in early 2016:  migraines, DM, memory deficit, CAD,    Diagnostic tests CT of head and neck:   MRA    Patient Stated Goals Improve balance and walking    Currently in Pain? No/denies                [facility] PT Assessment - 02/19/21 1950       Assessment   Medical Diagnosis Cerebellar CVA    Referring Provider (PT) Lauraine Rinne, PA-C    Onset Date/Surgical Date 01/20/21    Hand Dominance Right    Prior Therapy Pt had inpatient rehab therapies from 01-30-21 - 02-11-21      Home Environment   Living Environment Private residence    Living Arrangements Children    Type of [place] to enter    Home Layout Two level    Additional Comments where she is living now there are 3 floors; she lives on ground level apartment      Observation/Other Assessments   Focus on Therapeutic Outcomes (FOTO)  CVA LE 59.2/100(risk adjusted 58/100)      ROM / Strength   AROM / PROM / Strength Strength      Strength   Overall Strength Within functional limits for tasks performed    Overall Strength Comments RLE is slightly weaker than LLE (hip musc.)      Bed Mobility   Bed Mobility Supine to Sit;Sit to Supine    Sit to Supine Supervision/Verbal cueing      Transfers   Transfers Sit to Stand;Stand to Sit    Five time sit to stand comments  31.09   from chair - no UE support used   Comments UE support used for controlled descent; pt able to stand without UE support with very wide BOS and uses back of legs against mat table to stabilize      Ambulation/Gait   Ambulation/Gait Yes    Ambulation/Gait Assistance 4: Min assist    Ambulation Distance (Feet) 100 Feet    Assistive device None   pt has RW but did not use during eval   Gait Pattern Ataxic    Ambulation Surface Level;Indoor    Gait velocity 14.57 secs = 2.25 ft/sec    Stairs Yes    Stairs Assistance 4: Min guard    Stair Management Technique Two rails;Step to pattern;Forwards    Number of Stairs 4    Height of Stairs 6      Standardized Balance Assessment   Standardized Balance Assessment Timed Up  and Go Test      Berg Balance Test   Sit to Stand Needs minimal aid to stand or to stabilize   uses back of legs against mat table to stabilize   Standing Unsupported Able to stand safely 2 minutes   approx. 18"   Sitting  with Back Unsupported but Feet Supported on Floor or Stool Able to sit safely and securely 2 minutes    Stand to Sit Controls descent by using hands    Transfers Able to transfer safely, definite need of hands    Standing Unsupported with Eyes Closed Able to stand 10 seconds with supervision    Standing Unsupported with Feet Together Able to place feet together independently and stand for 1 minute with supervision   Rt foot externally rotated - approx. 5" apart   From Standing, Reach Forward with Outstretched Arm Can reach forward >12 cm safely (5")    From Standing Position, Pick up Object from Floor Able to pick up shoe, needs supervision    From Standing Position, Turn to Look Behind Over each Shoulder Turn sideways only but maintains balance    Turn 360 Degrees Needs close supervision or verbal cueing   10.03 secs to Rt:  15.56 secs to Lt   Standing Unsupported, Alternately Place Feet on Step/Stool Able to complete >2 steps/needs minimal assist    Standing Unsupported, One Foot in Front Able to plae foot ahead of the other independently and hold 30 seconds   stepped with RLE   Standing on One Leg Unable to try or needs assist to prevent fall    Total Score 34      Timed Up and Go Test   TUG Normal TUG    Normal TUG (seconds) 17.32    TUG Comments with CGA as no RW used                        Objective measurements completed on examination: See above findings.               PT Education - 02/19/21 2001     Education Details instructed pt and daughter to begin SLS at counter with UE support and marching in place at [employer]) Educated Patient;Child(ren)    Methods Explanation;Demonstration    Comprehension Verbalized  understanding;Need further instruction              PT Short Term Goals - 02/19/21 2015       PT SHORT TERM GOAL #1   Title Improve Berg score from 34/56 to >/= 39/56 for reduced fall risk.    Baseline 34/56 - 02-19-21    Time 4    Period Weeks    Status New    Target Date 03/20/21      PT SHORT TERM GOAL #2   Title Improve TUG score from 17.32 secs to </= 14.5 secs without use of device to increase safety with ambulation.    Baseline 17.32 secs    Time 4    Period Weeks    Status New    Target Date 03/20/21      PT SHORT TERM GOAL #3   Title Increase gait velocity from 2.25 ft/sec without device to >/= 2.6 ft/sec with CGA for incr. gait efficiency.    Baseline 2.25 ft/sec = 14.57 secs    Time 4    Period Weeks    Status New    Target Date 03/20/21      PT SHORT TERM GOAL #4   Title Assess DGI and establish goal as appropriate.    Time 4    Period Weeks    Status New    Target Date 03/20/21      PT SHORT TERM GOAL #5  Title Pt wil amb. 350' with CGA on flat, even surface without LOB.    Time 4    Period Weeks    Status New    Target Date 03/20/21      Additional Short Term Goals   Additional Short Term Goals Yes      PT SHORT TERM GOAL #6   Title Independent in HEP for balance and strengthening exercises.    Time 4    Period Weeks    Status New    Target Date 03/20/21               PT Long Term Goals - 02/19/21 2025       PT LONG TERM GOAL #1   Title Improve Berg score from 34/56 to >/= 50/56 to reduce fall risk.    Baseline 34/56 on 02-19-21    Time 12    Period Weeks    Status New    Target Date 05/15/21      PT LONG TERM GOAL #2   Title Pt will ambulate 1000' modified independently without device on even and uneven surfaces.    Time 12    Period Weeks    Status New    Target Date 05/15/21      PT LONG TERM GOAL #3   Title Improve TUG score from 17.32 secs to </= 13 secs to reduce fall risk.    Baseline 17.32 secs on 02-19-21 with  CGA    Time 12    Period Weeks    Status New    Target Date 05/15/21      PT LONG TERM GOAL #4   Title Improve DGI by at least 10 points to increase safety with gait.    Time 12    Period Weeks    Status New    Target Date 05/15/21      PT LONG TERM GOAL #5   Title Independent in updated HEP as appropriate.    Time 12    Period Weeks    Status New    Target Date 05/15/21      Additional Long Term Goals   Additional Long Term Goals Yes      PT LONG TERM GOAL #6   Title Increase LE FOTO score by at least 10 points to demo improvement in functional status.    Baseline 59/100 on 02-19-21    Time 12    Period Weeks    Status New    Target Date 05/15/21                    Plan - 02/19/21 2005     Clinical Impression Statement Pt is a 65 yr old lady s/p cerebellar CVA on 01-20-21; pt was transferred to inpatient rehab at [facility] on 01-30-21 and discharged home with daughter on 02-11-21.  Pt presents with ataxia, gait and balance dysfunction, and slightly weaker RLE compared to LLE.  Pt's Berg score = 34/56 (indicative of fall risk) and TUG score = 17.32 secs without use of device.  Pt has RW but did not bring to today's evaluation.  Pt will benefit from PT to address gait and balance deficits, RLE weakness and decreased coordination.    Personal Factors and Comorbidities Comorbidity 2;Past/Current Experience;Transportation    Comorbidities migraines, epilepsy with seizure disorder, DM, CAD, s/p cataract sx on bil. eyes, memory deficit    Examination-Activity Limitations Bathing;Stand;Transfers;Carry;Squat;Stairs;Locomotion Level;Hygiene/Grooming;Bend    Examination-Participation Restrictions Cleaning;Community Activity;Driving;Laundry;Shop;Meal Prep  Stability/Clinical Decision Making Evolving/Moderate complexity    Clinical Decision Making Moderate    Rehab Potential Good    PT Frequency 2x / week    PT Duration 12 weeks    PT Treatment/Interventions ADLs/Self Care Home  Management;Aquatic Therapy;DME Instruction;Gait training;Stair training;Therapeutic activities;Therapeutic exercise;Balance training;Neuromuscular re-education;Patient/family education    PT Next Visit Plan issue balance HEP (check SLS & marching exs as instructed on 02-19-21);  balance & gait training - pt to bring RW to next session    PT Home Exercise Plan balance    Recommended Other Services pt is receiving OT and ST    Consulted and Agree with Plan of Care Patient             Patient will benefit from skilled therapeutic intervention in order to improve the following deficits and impairments:  Abnormal gait, Decreased activity tolerance, Decreased endurance, Decreased balance, Decreased coordination, Decreased strength, Impaired UE functional use  Visit Diagnosis: Other abnormalities of gait and mobility - Plan: PT plan of care cert/re-cert  Other lack of coordination - Plan: PT plan of care cert/re-cert  Ataxia due to acute cerebrovascular disease - Plan: PT plan of care cert/re-cert  Muscle weakness (generalized) - Plan: PT plan of care cert/re-cert  Unsteadiness on feet - Plan: PT plan of care cert/re-cert     Problem List Patient Active Problem List   Diagnosis Date Noted   Cerebellar stroke ([place]) 01/30/2021   Acute cerebrovascular accident (CVA) ([place]) 01/20/2021   Acute CVA (cerebrovascular accident) ([place]) 01/20/2021   GERD (gastroesophageal reflux disease)    Coronary artery disease    Depression 01/06/2020   Pneumonia 01/05/2020   History of chest pain 09/26/2018   Dyspnea on exertion 09/08/2018   Chest pain, rule out acute myocardial infarction 10/30/2017   Edema 01/25/2017   Tobacco abuse 06/10/2016   Headache 09/15/2015   OSA (obstructive sleep apnea) 07/30/2015   Nephrolithiasis 07/30/2015   Vasovagal syncope 07/30/2015   Carotid artery stenosis, asymptomatic 07/30/2015   CAD (coronary artery disease) 08/22/2013   Essential hypertension 08/22/2013    Diabetes mellitus without complication ([place])    Dyslipidemia    Seizures ([place])    Generalized convulsive epilepsy ([place]) 03/22/2013   Memory deficit 03/22/2013    Alda Lea, PT 02/19/2021, 8:44 PM  [facility] [address] [place], [place], [address] Phone: [phone]   Fax:  [phone]  Name: Samantha Huffman MRN: [id number] Date of Birth: 01-30-1956

## 2021-02-19 NOTE — Therapy (Signed)
[facility] [address] [place], [place], [address] Phone: [phone]   Fax:  [phone]  Occupational Therapy Evaluation  Patient Details  Name: Samantha Huffman MRN: [id number] Date of Birth: 07-28-1956 Referring Provider (OT): Danella Sensing, NP   Encounter Date: 02/19/2021   OT End of Session - 02/19/21 0918     Visit Number 1    Number of Visits 25    Date for OT Re-Evaluation 05/20/21    Authorization Type [employer]    Authorization - Visit Number 1    Authorization - Number of Visits 10    Progress Note Due on Visit 10    OT Start Time 0805    OT Stop Time 6237    OT Time Calculation (min) 42 min    Equipment Utilized During Treatment **FOTO**    Activity Tolerance Patient tolerated treatment well    Behavior During Therapy [facility] for tasks assessed/performed             Past Medical History:  Diagnosis Date   Carotid artery stenosis    1-39% bilateral stenosis by dopplers 08/2020   Coronary artery disease    a. s/p CABG 10/2012.  cath 05/2016 with moderate LM stenosis, mild LCx and RCA stenosis and occluded LAD with patent LIMA to LAD, free radial to OM.  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   Dyslipidemia    Family history of adverse reaction to anesthesia    mother and sister ponv   Generalized convulsive epilepsy without mention of intractable epilepsy    GERD (gastroesophageal reflux disease)    History of kidney stones    Hypertension    Memory deficit    slight   Migraine    "controlled on daily RX " (06/11/2016)   Nephrolithiasis 07/30/2015   S/p lithotripsy x 3 and right and left ureter stents   OSA (obstructive sleep apnea)    cpap    PONV (postoperative nausea and vomiting)    Seizures ([place])    "controlled w/daily RX; started in 2012; dr thinks they might be from the migraines; last sz was early part of 2016" (06/11/2016)   Type II diabetes mellitus ([place])    metformin    Vasovagal syncope 07/30/2015   2011    Past Surgical History:  Procedure Laterality Date   CARDIAC CATHETERIZATION  2014; 06/11/2016   CARDIAC CATHETERIZATION N/A 06/11/2016   Procedure: Left Heart Cath and Cors/Grafts Angiography;  Surgeon: Sherren Mocha, MD;  Location: [facility];  Service: Cardiovascular;  Laterality: N/A;   CESAREAN SECTION     CORONARY ANGIOPLASTY     CORONARY ARTERY BYPASS GRAFT  March, 2014   LIMA to LAD, left radial to LCx x 2   CYSTOSCOPY W/ URETERAL STENT PLACEMENT Left 01/16/2019   Procedure: CYSTOSCOPY WITH RETROGRADE PYELOGRAM/URETERAL LEFT STENT PLACEMENT;  Surgeon: Ardis Hughs, MD;  Location: [facility];  Service: Urology;  Laterality: Left;   CYSTOSCOPY/URETEROSCOPY/HOLMIUM LASER/STENT PLACEMENT Left 01/24/2019   Procedure: CYSTOSCOPY, LEFT URETEROSCOPY, HOLMIUM LASER, STONE REMOVAL AND STENT EXCHANGE;  Surgeon: Ardis Hughs, MD;  Location: [facility];  Service: Urology;  Laterality: Left;   LITHOTRIPSY  X 3   RIGHT/LEFT HEART CATH AND CORONARY/GRAFT ANGIOGRAPHY N/A 09/08/2018   Procedure: RIGHT/LEFT HEART CATH AND CORONARY/GRAFT ANGIOGRAPHY;  Surgeon: Martinique, Peter M, MD;  Location: [facility];  Service: Cardiovascular;  Laterality: N/A;   TOTAL ABDOMINAL HYSTERECTOMY     ovaries took before hysterectomy  TUBAL LIGATION     URETERAL STENT PLACEMENT      There were no vitals filed for this visit.   Subjective Assessment - 02/19/21 0812     Subjective  Pt reports that she gets confused if she tries to read now.  Pt using LUE to guide RUE at times, but tries to use RUE.    Patient is accompanied by: Family member   dtr--Kia   Pertinent History CVA with R-sided weakness/ataxia.  PMH:  CAD, HTN, DM, OSA, seizure disorder, dyslipidemia, migraine, CABG (2014), recent bilateral cataract surgeries just prior to CVA    Limitations fall risk, ataxia, visual deficits    Patient Stated Goals return to driving, return to living alone, cook     Currently in Pain? No/denies               [facility] OT Assessment - 02/19/21 9163       Assessment   Medical Diagnosis CVA    Referring Provider (OT) Danella Sensing, NP    Onset Date/Surgical Date 01/20/21    Hand Dominance Right    Prior Therapy CIR      Precautions   Precautions Fall      Balance Screen   Has the patient fallen in the past 6 months No      Home  Environment   Family/patient expects to be discharged to: Private residence    Lives With --   alone prior, currently living with dtr (with 24hr supervision)     Prior Function   Level of [place] Retired    Leisure enjoys shopping, cooking, gardening/flowers      ADL   Eating/Feeding Set up   slower, needs assist for cutting/opening packages/containers   Grooming --   difficulty using with RUE and using LUE to assist RUE with brushing teeth. Has not attempted hair care (currently braided)   Upper Body Bathing Moderate assistance    Lower Body Bathing Moderate assistance    Upper Body Dressing --   mod A for bra, shirt independently   Lower Body Dressing Moderate assistance   assist for starting on feet, assist for shoes (depending on shoe)   Toilet Transfer Modified independent    Toileting - Clothing Manipulation Modified independent    Toileting -  Hygiene Modified Independent    Tub/Shower Transfer Moderate assistance   getting in tub to bathe (other dtr assisting).  Grab bars at pt's home, but not dtrs     IADL   Prior Level of Function Shopping independent    Shopping Needs to be accompanied on any shopping trip    Prior Level of Function Light Housekeeping independent    Light Housekeeping --   not performing, did fold 1 blanket   Prior Level of Function Meal Prep independent    Meal Prep --   needs assist for simple snack prep due to ataxia   Prior Level of Function Community Mobility independent    Community Mobility Relies on family or friends for transportation     Prior Level of Function Medication Managment independent    Medication Management --   dtr assisting     Mobility   Mobility Status Comments Pt reports that she is walking without device/assist in the home, but using hand-held assist in the community.  Pt has RW      Written Expression   Dominant Hand Right    Handwriting --   difficulty with ataxia  Vision - History   Baseline Vision --   Just had cataract surgery with both eyes prior to CVA.  No glasses     Vision Assessment   Eye Alignment Within Functional Limits    Ocular Range of Motion Within Functional Limits    Tracking/Visual Pursuits --   incr time needed/mild decr smoothness   Saccades --   decr smoothness/ataxia, dizzines reported   Convergence Impaired (comment)    Visual Fields No apparent deficits    Comment Pt reports dizziness with scaccades/quicker eye movements.  Pt reports light sensitivity.  OT recommended that pt follow up with eye doctor as she has not had follow up since cataract surgery due to CVA and due to visual changes from CVA.      Cognition   Overall Cognitive Status Impaired/Different from baseline    Area of Impairment Memory;Attention    Memory Decreased short-term memory    Attention Sustained   decr per pt   Cognition Comments Pt also reports confusion with reading.  Cognition to be assessed further in functional context prn due to time constraints.      Posture/Postural Control   Posture/Postural Control Postural limitations    Posture Comments decr trunk stability/ataxia noted.  Pt noted to lean posteriorly with gait.  R shoulder hike/rounded shoulder with RUE movement      Sensation   Light Touch Appears Intact    Additional Comments Pt denies sensation changes      Coordination   9 Hole Peg Test Right;Left    Right 9 Hole Peg Test 46.03   with multiple drops and shoulder hike noted   Left 9 Hole Peg Test 28.59    Box and Blocks --   NT due to time constraints     ROM / Strength    AROM / PROM / Strength AROM;Strength      AROM   Overall AROM  Within functional limits for tasks performed    Overall AROM Comments BUEs      Strength   Overall Strength Deficits    Overall Strength Comments LUE proximal strength grossly 5/5, RUE proximal strength 4+/5 shoulder and 5/5 biceps/triceps      Hand Function   Right Hand Grip (lbs) 58.4    Left Hand Grip (lbs) 62.8                             OT Education - 02/19/21 0915     Education Details Recommended pt follow up with eye doctor as she has not been seen since last cataract surgery due to CVA (and to assess visual changes from CVA); OT eval results and POC; Recommended that pt take medications (particularly BP and DM medications) prior to therapy (pt reports that she did not take yet today due to some making her sleepy    Person(s) Educated Patient;Child(ren)    Methods Explanation    Comprehension Verbalized understanding              OT Short Term Goals - 02/19/21 1047       OT SHORT TERM GOAL #1   Title Pt will be independent with initial HEP.--Check initial STGs (#1-4)  03/21/21    Time 4    Period Weeks    Status New      OT SHORT TERM GOAL #2   Title Assess box and blocks test and establish goal as appropriate for RUE.    Time  4    Period Weeks    Status New      OT SHORT TERM GOAL #3   Title Pt will perform bathing with min A.    Time 4    Period Weeks    Status New      OT SHORT TERM GOAL #4   Title Pt will perofrm UB dressing mod I.    Time 4    Period Weeks    Status New      OT SHORT TERM GOAL #5   Title Pt will perform LB dressing with min A.--check STGs #5-8  04/20/21    Time 8    Period Weeks    Status New      OT SHORT TERM GOAL #6   Title Pt will perform bathing with supervision.    Time 8    Period Weeks    Status New      OT SHORT TERM GOAL #7   Title Pt will peform simple home maintenane task and snack prep mod I.    Time 8    Period Weeks     Status New      OT SHORT TERM GOAL #8   Title Pt will cut food mod I using AE prn.    Time 8    Period Weeks    Status New               OT Long Term Goals - 02/19/21 1059       OT LONG TERM GOAL #1   Title Pt will be independent with updated HEP--check LTGs 05/20/21    Time 12    Period Weeks    Status New      OT LONG TERM GOAL #2   Title Pt will perform BADLs mod I with AE/DME and strategies prn.    Time 12    Period Weeks    Status New      OT LONG TERM GOAL #3   Title Pt wil be for memory/cognitive compensation strategies for ADL/IADLs prn.    Time 12    Period Weeks    Status New      OT LONG TERM GOAL #4   Title Pt will perform simple cooking task with supervision.    Time 12    Period Weeks    Status New      OT LONG TERM GOAL #5   Title Pt will perform environmental scanning/navigation with at least 90% accuracy (with head turns) without reports of dizziness/LOB.    Time 12    Period Weeks    Status New      Long Term Additional Goals   Additional Long Term Goals Yes      OT LONG TERM GOAL #6   Title Pt will improve RUE coordination as shown by completing 9-hole peg test in less than 36sec without drops.    Time 12    Period Weeks    Status New      OT LONG TERM GOAL #7   Title --    Time --    Period --    Status --                   Plan - 02/19/21 0921     Clinical Impression Statement Pt is a 65 y.o. female referred to occupational therapy with diagnosis of CVA.  Pt was hospitalized 01/20/21-02/11/21.  PMH includes:  PMH:  CAD, HTN, DM, OSA, seizure  disorder, dyslipidemia, migraine, CABG (2014), recent bilateral cataract surgeries just prior to CVA.  Pt was independent and living alone prior to CVA; however, pt currently living with dtr and needs assist for BADLs and IADLs.  Pt presents today with decr strength, decr coordination, ataxia, visual deficits, cognitive deficits, decr balance/functional mobility.  Pt would benefit from  occupational therapy to address these deficits for incr independence/safety with ADLs/IADLs and incr dominant RUE functional use.    OT Occupational Profile and History Detailed Assessment- Review of Records and additional review of physical, cognitive, psychosocial history related to current functional performance    Occupational performance deficits (Please refer to evaluation for details): ADL's;IADL's;Leisure;Social Participation    Body Structure / Function / Physical Skills ADL;Decreased knowledge of use of DME;Strength;GMC;Dexterity;Balance;UE functional use;IADL;Endurance;ROM;Vision;Improper spinal/pelvic alignment;Coordination;Mobility;FMC    Cognitive Skills Attention;Memory;Understand    Rehab Potential Good    Clinical Decision Making Several treatment options, min-mod task modification necessary    Comorbidities Affecting Occupational Performance: May have comorbidities impacting occupational performance    Modification or Assistance to Complete Evaluation  Min-Moderate modification of tasks or assist with assess necessary to complete eval    OT Frequency 2x / week    OT Duration 12 weeks   +eval   OT Treatment/Interventions Self-care/ADL training;Moist Heat;DME and/or AE instruction;Balance training;Aquatic Therapy;Therapeutic activities;Cognitive remediation/compensation;Therapeutic exercise;Cryotherapy;Neuromuscular education;Functional Mobility Training;Passive range of motion;Visual/perceptual remediation/compensation;Patient/family education;Manual Therapy;Energy conservation;Paraffin;Fluidtherapy    Plan assess box and blocks test and establish STG; initiate HEP for coordination/control    Consulted and Agree with Plan of Care Patient;Family member/caregiver    Family Member Consulted dtr             Patient will benefit from skilled therapeutic intervention in order to improve the following deficits and impairments:   Body Structure / Function / Physical Skills: ADL,  Decreased knowledge of use of DME, Strength, GMC, Dexterity, Balance, UE functional use, IADL, Endurance, ROM, Vision, Improper spinal/pelvic alignment, Coordination, Mobility, FMC Cognitive Skills: Attention, Memory, Understand     Visit Diagnosis: Other lack of coordination  Ataxia  Muscle weakness (generalized)  Visuospatial deficit  Unsteadiness on feet  Other abnormalities of gait and mobility  Attention and concentration deficit  Other symptoms and signs involving cognitive functions following cerebral infarction    Problem List Patient Active Problem List   Diagnosis Date Noted   Cerebellar stroke ([place]) 01/30/2021   Acute cerebrovascular accident (CVA) ([place]) 01/20/2021   Acute CVA (cerebrovascular accident) ([place]) 01/20/2021   GERD (gastroesophageal reflux disease)    Coronary artery disease    Depression 01/06/2020   Pneumonia 01/05/2020   History of chest pain 09/26/2018   Dyspnea on exertion 09/08/2018   Chest pain, rule out acute myocardial infarction 10/30/2017   Edema 01/25/2017   Tobacco abuse 06/10/2016   Headache 09/15/2015   OSA (obstructive sleep apnea) 07/30/2015   Nephrolithiasis 07/30/2015   Vasovagal syncope 07/30/2015   Carotid artery stenosis, asymptomatic 07/30/2015   CAD (coronary artery disease) 08/22/2013   Essential hypertension 08/22/2013   Diabetes mellitus without complication ([place])    Dyslipidemia    Seizures ([place])    Generalized convulsive epilepsy ([place]) 03/22/2013   Memory deficit 03/22/2013    [facility] 02/19/2021, 12:10 PM  [facility] [address] [place], [place], [address] Phone: [phone]   Fax:  [phone]  Name: Samantha Huffman MRN: [id number] Date of Birth: 1956-02-01  Vianne Bulls, OTR/L [facility] [address] [place], [place]  [address]  [phone] phone [phone] 02/19/21 12:10 PM

## 2021-02-19 NOTE — Therapy (Signed)
[facility] [address] [place], [place], [address] Phone: [phone]   Fax:  [phone]  Physical Therapy Evaluation  Patient Details  Name: Samantha Huffman MRN: [id number] Date of Birth: 01/10/1956 Referring Provider (PT): Lauraine Rinne, PA-C   Encounter Date: 02/19/2021   PT End of Session - 02/19/21 2002     Visit Number 1    Number of Visits 25    Date for PT Re-Evaluation 05/15/21    Authorization Type [employer]    Authorization Time Period 02-19-21 - 05-21-21    Progress Note Due on Visit 10    PT Start Time 0850    PT Stop Time 0930    PT Time Calculation (min) 40 min    Activity Tolerance Patient tolerated treatment well    Behavior During Therapy [facility] for tasks assessed/performed             Past Medical History:  Diagnosis Date   Carotid artery stenosis    1-39% bilateral stenosis by dopplers 08/2020   Coronary artery disease    a. s/p CABG 10/2012.  cath 05/2016 with moderate LM stenosis, mild LCx and RCA stenosis and occluded LAD with patent LIMA to LAD, free radial to OM.  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   Dyslipidemia    Family history of adverse reaction to anesthesia    mother and sister ponv   Generalized convulsive epilepsy without mention of intractable epilepsy    GERD (gastroesophageal reflux disease)    History of kidney stones    Hypertension    Memory deficit    slight   Migraine    "controlled on daily RX " (06/11/2016)   Nephrolithiasis 07/30/2015   S/p lithotripsy x 3 and right and left ureter stents   OSA (obstructive sleep apnea)    cpap    PONV (postoperative nausea and vomiting)    Seizures ([place])    "controlled w/daily RX; started in 2012; dr thinks they might be from the migraines; last sz was early part of 2016" (06/11/2016)   Type II diabetes mellitus ([place])    metformin   Vasovagal syncope 07/30/2015   2011    Past Surgical History:  Procedure  Laterality Date   CARDIAC CATHETERIZATION  2014; 06/11/2016   CARDIAC CATHETERIZATION N/A 06/11/2016   Procedure: Left Heart Cath and Cors/Grafts Angiography;  Surgeon: Sherren Mocha, MD;  Location: [facility];  Service: Cardiovascular;  Laterality: N/A;   CESAREAN SECTION     CORONARY ANGIOPLASTY     CORONARY ARTERY BYPASS GRAFT  March, 2014   LIMA to LAD, left radial to LCx x 2   CYSTOSCOPY W/ URETERAL STENT PLACEMENT Left 01/16/2019   Procedure: CYSTOSCOPY WITH RETROGRADE PYELOGRAM/URETERAL LEFT STENT PLACEMENT;  Surgeon: Ardis Hughs, MD;  Location: [facility];  Service: Urology;  Laterality: Left;   CYSTOSCOPY/URETEROSCOPY/HOLMIUM LASER/STENT PLACEMENT Left 01/24/2019   Procedure: CYSTOSCOPY, LEFT URETEROSCOPY, HOLMIUM LASER, STONE REMOVAL AND STENT EXCHANGE;  Surgeon: Ardis Hughs, MD;  Location: [facility];  Service: Urology;  Laterality: Left;   LITHOTRIPSY  X 3   RIGHT/LEFT HEART CATH AND CORONARY/GRAFT ANGIOGRAPHY N/A 09/08/2018   Procedure: RIGHT/LEFT HEART CATH AND CORONARY/GRAFT ANGIOGRAPHY;  Surgeon: Martinique, Peter M, MD;  Location: [facility];  Service: Cardiovascular;  Laterality: N/A;   TOTAL ABDOMINAL HYSTERECTOMY     ovaries took before hysterectomy   TUBAL LIGATION     URETERAL STENT PLACEMENT  There were no vitals filed for this visit.    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  Pt has RW which was left in the car because she wants to walk without it (holding onto daughter's arm for assistance)    Patient is accompained by: Family member   Kia   Pertinent History Carotid artery stenosis, HTN, epilepsy with seizure disorder - last seizure was in early 2016:  migraines, DM, memory deficit, CAD,    Diagnostic tests CT of head and neck:   MRA    Patient Stated Goals Improve balance and walking    Currently in Pain? No/denies                [facility] PT Assessment - 02/19/21 1950       Assessment   Medical Diagnosis Cerebellar CVA    Referring Provider (PT) Lauraine Rinne, PA-C    Onset Date/Surgical Date 01/20/21    Hand Dominance Right    Prior Therapy Pt had inpatient rehab therapies from 01-30-21 - 02-11-21      Home Environment   Living Environment Private residence    Living Arrangements Children    Type of [place] to enter    Home Layout Two level    Additional Comments where she is living now there are 3 floors; she lives on ground level apartment      ROM / Strength   AROM / PROM / Strength Strength      Strength   Overall Strength Within functional limits for tasks performed    Overall Strength Comments RLE is slightly weaker than LLE (hip musc.)      Bed Mobility   Bed Mobility Supine to Sit;Sit to Supine    Sit to Supine Supervision/Verbal cueing      Transfers   Transfers Sit to Stand;Stand to Sit    Five time sit to stand comments  31.09   from chair - no UE support used   Comments UE support used for controlled descent; pt able to stand without UE support with very wide BOS and uses back of legs against mat table to stabilize      Ambulation/Gait   Ambulation/Gait Yes    Ambulation/Gait Assistance 4: Min assist    Ambulation Distance (Feet) 100 Feet    Assistive device None   pt has RW but did not use during eval   Gait Pattern Ataxic    Ambulation Surface Level;Indoor    Gait velocity 14.57 secs = 2.25 ft/sec    Stairs Yes    Stairs Assistance 4: Min guard    Stair Management Technique Two rails;Step to pattern;Forwards    Number of Stairs 4    Height of Stairs 6      Standardized Balance Assessment   Standardized Balance Assessment Timed Up and Go Test      Berg Balance Test   Sit to Stand Needs minimal aid to stand or to stabilize   uses back of legs  against mat table to stabilize   Standing Unsupported Able to stand safely 2 minutes   approx. 18"   Sitting with Back Unsupported but Feet Supported on Floor or Stool Able to sit safely and securely 2 minutes  Stand to Sit Controls descent by using hands    Transfers Able to transfer safely, definite need of hands    Standing Unsupported with Eyes Closed Able to stand 10 seconds with supervision    Standing Unsupported with Feet Together Able to place feet together independently and stand for 1 minute with supervision   Rt foot externally rotated - approx. 5" apart   From Standing, Reach Forward with Outstretched Arm Can reach forward >12 cm safely (5")    From Standing Position, Pick up Object from Floor Able to pick up shoe, needs supervision    From Standing Position, Turn to Look Behind Over each Shoulder Turn sideways only but maintains balance    Turn 360 Degrees Needs close supervision or verbal cueing   10.03 secs to Rt:  15.56 secs to Lt   Standing Unsupported, Alternately Place Feet on Step/Stool Able to complete >2 steps/needs minimal assist    Standing Unsupported, One Foot in Front Able to plae foot ahead of the other independently and hold 30 seconds   stepped with RLE   Standing on One Leg Unable to try or needs assist to prevent fall    Total Score 34      Timed Up and Go Test   TUG Normal TUG    Normal TUG (seconds) 17.32    TUG Comments with CGA as no RW used                        Objective measurements completed on examination: See above findings.               PT Education - 02/19/21 2001     Education Details instructed pt and daughter to begin SLS at counter with UE support and marching in place at [employer]) Educated Patient;Child(ren)    Methods Explanation;Demonstration    Comprehension Verbalized understanding;Need further instruction              PT Short Term Goals - 02/19/21 2015       PT SHORT TERM GOAL #1    Title Improve Berg score from 34/56 to >/= 39/56 for reduced fall risk.    Baseline 34/56 - 02-19-21    Time 4    Period Weeks    Status New    Target Date 03/20/21      PT SHORT TERM GOAL #2   Title Improve TUG score from 17.32 secs to </= 14.5 secs without use of device to increase safety with ambulation.    Baseline 17.32 secs    Time 4    Period Weeks    Status New    Target Date 03/20/21      PT SHORT TERM GOAL #3   Title Increase gait velocity from 2.25 ft/sec without device to >/= 2.6 ft/sec with CGA for incr. gait efficiency.    Baseline 2.25 ft/sec = 14.57 secs    Time 4    Period Weeks    Status New    Target Date 03/20/21      PT SHORT TERM GOAL #4   Title Assess DGI and establish goal as appropriate.    Time 4    Period Weeks    Status New    Target Date 03/20/21      PT SHORT TERM GOAL #5   Title Pt wil amb. 350' with CGA on flat, even surface without LOB.    Time 4  Period Weeks    Status New    Target Date 03/20/21      Additional Short Term Goals   Additional Short Term Goals Yes      PT SHORT TERM GOAL #6   Title Independent in HEP for balance and strengthening exercises.    Time 4    Period Weeks    Status New    Target Date 03/20/21               PT Long Term Goals - 02/19/21 2025       PT LONG TERM GOAL #1   Title Improve Berg score from 34/56 to >/= 50/56 to reduce fall risk.    Baseline 34/56 on 02-19-21    Time 12    Period Weeks    Status New    Target Date 05/15/21      PT LONG TERM GOAL #2   Title Pt will ambulate 1000' modified independently without device on even and uneven surfaces.    Time 12    Period Weeks    Status New    Target Date 05/15/21      PT LONG TERM GOAL #3   Title Improve TUG score from 17.32 secs to </= 13 secs to reduce fall risk.    Baseline 17.32 secs on 02-19-21 with CGA    Time 12    Period Weeks    Status New    Target Date 05/15/21      PT LONG TERM GOAL #4   Title Improve DGI by at  least 10 points to increase safety with gait.    Time 12    Period Weeks    Status New    Target Date 05/15/21      PT LONG TERM GOAL #5   Title Independent in updated HEP as appropriate.    Time 12    Period Weeks    Status New    Target Date 05/15/21                    Plan - 02/19/21 2005     Clinical Impression Statement Pt is a 65 yr old lady s/p cerebellar CVA on 01-20-21; pt was transferred to inpatient rehab at [facility] on 01-30-21 and discharged home with daughter on 02-11-21.  Pt presents with ataxia, gait and balance dysfunction, and slightly weaker RLE compared to LLE.  Pt's Berg score = 34/56 (indicative of fall risk) and TUG score = 17.32 secs without use of device.  Pt has RW but did not bring to today's evaluation.  Pt will benefit from PT to address gait and balance deficits, RLE weakness and decreased coordination.    Personal Factors and Comorbidities Comorbidity 2;Past/Current Experience;Transportation    Comorbidities migraines, epilepsy with seizure disorder, DM, CAD, s/p cataract sx on bil. eyes, memory deficit    Examination-Activity Limitations Bathing;Stand;Transfers;Carry;Squat;Stairs;Locomotion Level;Hygiene/Grooming;Bend    Examination-Participation Restrictions Cleaning;Community Activity;Driving;Laundry;Shop;Meal Prep    Stability/Clinical Decision Making Evolving/Moderate complexity    Clinical Decision Making Moderate    Rehab Potential Good    PT Frequency 2x / week    PT Duration 12 weeks    PT Treatment/Interventions ADLs/Self Care Home Management;Aquatic Therapy;DME Instruction;Gait training;Stair training;Therapeutic activities;Therapeutic exercise;Balance training;Neuromuscular re-education;Patient/family education    PT Next Visit Plan issue balance HEP (check SLS & marching exs as instructed on 02-19-21);  balance & gait training - pt to bring RW to next session    PT Home Exercise Plan balance  Recommended Other Services pt is receiving OT  and ST    Consulted and Agree with Plan of Care Patient             Patient will benefit from skilled therapeutic intervention in order to improve the following deficits and impairments:  Abnormal gait, Decreased activity tolerance, Decreased endurance, Decreased balance, Decreased coordination, Decreased strength, Impaired UE functional use  Visit Diagnosis: Other abnormalities of gait and mobility - Plan: PT plan of care cert/re-cert  Other lack of coordination - Plan: PT plan of care cert/re-cert  Ataxia due to acute cerebrovascular disease - Plan: PT plan of care cert/re-cert  Muscle weakness (generalized) - Plan: PT plan of care cert/re-cert  Unsteadiness on feet - Plan: PT plan of care cert/re-cert     Problem List Patient Active Problem List   Diagnosis Date Noted   Cerebellar stroke ([place]) 01/30/2021   Acute cerebrovascular accident (CVA) ([place]) 01/20/2021   Acute CVA (cerebrovascular accident) ([place]) 01/20/2021   GERD (gastroesophageal reflux disease)    Coronary artery disease    Depression 01/06/2020   Pneumonia 01/05/2020   History of chest pain 09/26/2018   Dyspnea on exertion 09/08/2018   Chest pain, rule out acute myocardial infarction 10/30/2017   Edema 01/25/2017   Tobacco abuse 06/10/2016   Headache 09/15/2015   OSA (obstructive sleep apnea) 07/30/2015   Nephrolithiasis 07/30/2015   Vasovagal syncope 07/30/2015   Carotid artery stenosis, asymptomatic 07/30/2015   CAD (coronary artery disease) 08/22/2013   Essential hypertension 08/22/2013   Diabetes mellitus without complication ([place])    Dyslipidemia    Seizures ([place])    Generalized convulsive epilepsy ([place]) 03/22/2013   Memory deficit 03/22/2013    Alda Lea, PT 02/19/2021, 8:38 PM  [facility] [address] [place], [place], [address] Phone: [phone]   Fax:  [phone]  Name: Samantha Huffman MRN: [id number] Date of  Birth: [birth year]

## 2021-02-24 ENCOUNTER — Encounter: Payer: Medicare HMO | Attending: Registered Nurse | Admitting: Registered Nurse

## 2021-03-02 ENCOUNTER — Telehealth: Payer: Self-pay | Admitting: Neurology

## 2021-03-02 NOTE — Telephone Encounter (Signed)
Received blood work collected February 24, 2021 from PCP at [facility].  CBC, BMP showed no significant abnormalities, other than chloride mildly elevated at 112.   Hgb 12.4, platelet 150, creatinine 0.99.  Looks like seeing Dr. Jannifer Franklin 03/09/21 for hospital f/u for stroke.

## 2021-03-04 ENCOUNTER — Telehealth: Payer: Self-pay | Admitting: Cardiology

## 2021-03-04 NOTE — Telephone Encounter (Signed)
Spoke with daughter Ronny Bacon, [place] on file.  Made her aware that per pt's d/c summary from rehab she is to be taking Coreg 3.125mg  QD.  Daughter verbalized understanding and was appreciative for call.

## 2021-03-04 NOTE — Telephone Encounter (Signed)
New Message:    Pt needs to know what mg of Carvedilol and how is she supposed to be taking it daily? Patient have 2 different prescriptions.

## 2021-03-05 ENCOUNTER — Other Ambulatory Visit: Payer: Self-pay | Admitting: Physical Medicine and Rehabilitation

## 2021-03-09 ENCOUNTER — Encounter: Payer: Self-pay | Admitting: *Deleted

## 2021-03-09 ENCOUNTER — Ambulatory Visit: Payer: Medicare HMO | Admitting: Neurology

## 2021-03-09 ENCOUNTER — Encounter: Payer: Self-pay | Admitting: Neurology

## 2021-03-09 VITALS — BP 146/86 | HR 63 | Ht 61.0 in | Wt 147.1 lb

## 2021-03-09 DIAGNOSIS — G40309 Generalized idiopathic epilepsy and epileptic syndromes, not intractable, without status epilepticus: Secondary | ICD-10-CM | POA: Diagnosis not present

## 2021-03-09 DIAGNOSIS — R413 Other amnesia: Secondary | ICD-10-CM | POA: Diagnosis not present

## 2021-03-09 DIAGNOSIS — I6312 Cerebral infarction due to embolism of basilar artery: Secondary | ICD-10-CM

## 2021-03-09 DIAGNOSIS — I639 Cerebral infarction, unspecified: Secondary | ICD-10-CM

## 2021-03-09 NOTE — Progress Notes (Signed)
Patient ID: Samantha Huffman, female   DOB: 09/03/1955, 65 y.o.   MRN: [id number] Patient enrolled for [employer] to ship a 30 day cardiac event monitor to her home. Letter with instructions mailed to patient.

## 2021-03-09 NOTE — Progress Notes (Signed)
Reason for visit: Stroke, history of seizures, memory disturbance  Referring physician: [facility]  Samantha Huffman is a 65 y.o. female  History of present illness:  Samantha Huffman is a 65 year old right-handed black female with a history of diabetes, dyslipidemia, coronary artery disease, hypertension, tobacco abuse, gastroesophageal reflux disease, history of seizures, history of memory disturbance, history of migraine headache, history of sleep apnea on CPAP, and a history of renal calculi.  The patient was admitted to the hospital on 20 Jan 2021 with onset of a right hemiataxia syndrome.  The patient was found to have a right superior cerebellar stroke but also had a small stroke in the right posterior inferior cerebellar artery distribution and a left thalamic stroke.  The patient underwent MRA of the head and neck showing occlusion of the right superior cerebellar artery.  Mild stenosis at the origin of the right vertebral artery was noted, the right PICA was not visualized.  The patient had evidence of acute strokes in the right PICA distribution, left thalamus, and right superior cerebellar artery distribution.  The patient has had significant issues with ataxic speech, right hemiataxia, and some memory changes.  The patient had cataract surgery just prior to the stroke event, she had some incomplete return of vision following cataract surgery, this has persisted involving the right eye only, not the left.  She reports some visual blurring in the superior aspects of her vision in the right eye.  The patient is using a walker to get around, she is living with her family.  She requires some assistance with bathing and dressing and she cannot fix meals.  She denies any numbness of extremities or any true weakness of extremities.  She was smoking a pack of cigarettes every 1.5 days, she has quit smoking since the stroke.  She is on aspirin and Plavix combination which she was on prior to the stroke.   The patient did have a headache around the time of the stroke but this has improved.  She denies any problems with choking with swallowing.  She has done well with her seizure control on Keppra and Topamax.  Past Medical History:  Diagnosis Date   Carotid artery stenosis    1-39% bilateral stenosis by dopplers 08/2020   Coronary artery disease    a. s/p CABG 10/2012.  cath 05/2016 with moderate LM stenosis, mild LCx and RCA stenosis and occluded LAD with patent LIMA to LAD, free radial to OM.  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   Dyslipidemia    Family history of adverse reaction to anesthesia    mother and sister ponv   Generalized convulsive epilepsy without mention of intractable epilepsy    GERD (gastroesophageal reflux disease)    History of kidney stones    Hypertension    Memory deficit    slight   Migraine    "controlled on daily RX " (06/11/2016)   Nephrolithiasis 07/30/2015   S/p lithotripsy x 3 and right and left ureter stents   OSA (obstructive sleep apnea)    cpap    PONV (postoperative nausea and vomiting)    Seizures ([place])    "controlled w/daily RX; started in 2012; dr thinks they might be from the migraines; last sz was early part of 2016" (06/11/2016)   Type II diabetes mellitus ([place])    metformin   Vasovagal syncope 07/30/2015   2011    Past Surgical History:  Procedure Laterality Date   CARDIAC CATHETERIZATION  2014;  06/11/2016   CARDIAC CATHETERIZATION N/A 06/11/2016   Procedure: Left Heart Cath and Cors/Grafts Angiography;  Surgeon: Sherren Mocha, MD;  Location: [facility];  Service: Cardiovascular;  Laterality: N/A;   CESAREAN SECTION     CORONARY ANGIOPLASTY     CORONARY ARTERY BYPASS GRAFT  March, 2014   LIMA to LAD, left radial to LCx x 2   CYSTOSCOPY W/ URETERAL STENT PLACEMENT Left 01/16/2019   Procedure: CYSTOSCOPY WITH RETROGRADE PYELOGRAM/URETERAL LEFT STENT PLACEMENT;  Surgeon: Ardis Hughs, MD;  Location: [facility];  Service:  Urology;  Laterality: Left;   CYSTOSCOPY/URETEROSCOPY/HOLMIUM LASER/STENT PLACEMENT Left 01/24/2019   Procedure: CYSTOSCOPY, LEFT URETEROSCOPY, HOLMIUM LASER, STONE REMOVAL AND STENT EXCHANGE;  Surgeon: Ardis Hughs, MD;  [facility]: [facility];  Service: Urology;  Laterality: Left;   LITHOTRIPSY  X 3   RIGHT/LEFT HEART CATH AND CORONARY/GRAFT ANGIOGRAPHY N/A 09/08/2018   Procedure: RIGHT/LEFT HEART CATH AND CORONARY/GRAFT ANGIOGRAPHY;  Surgeon: Martinique, Peter M, MD;  Location: [facility];  Service: Cardiovascular;  Laterality: N/A;   TOTAL ABDOMINAL HYSTERECTOMY     ovaries took before hysterectomy   TUBAL LIGATION     URETERAL STENT PLACEMENT      Family History  Problem Relation Age of Onset   Cancer Father    Diabetes Father    Diabetes Brother    Stroke Brother    Diabetes Brother    Heart disease Brother        CAD with CABG   Heart disease Sister        CAD with MI   Heart attack Sister     Social history:  reports that she has been smoking cigarettes. She has a 11.55 pack-year smoking history. She quit smokeless tobacco use about 8 years ago.  Her smokeless tobacco use included snuff and chew. She reports current alcohol use. She reports that she does not use drugs.  Medications:  Prior to Admission medications   Medication Sig Start Date End Date Taking? Authorizing Provider  acetaminophen (TYLENOL) 325 MG tablet Take 1-2 tablets (325-650 mg total) by mouth every 4 (four) hours as needed for mild pain. 02/11/21   Love, Ivan Anchors, PA-C  Alirocumab (PRALUENT) 75 MG/ML SOAJ INJECT CONTENTS OF 1 PEN INTO THE SKIN EVERY 14 DAYS 08/18/20   Sueanne Margarita, MD  aspirin EC 325 MG tablet Take 1 tablet (325 mg total) by mouth daily. NEED TO PURCHASE THIS OVER THE COUNTER 02/11/21   Love, Ivan Anchors, PA-C  atorvastatin (LIPITOR) 80 MG tablet Take 1 tablet (80 mg total) by mouth daily. 02/11/21   Love, Ivan Anchors, PA-C  carvedilol (COREG) 3.125 MG tablet Take 1 tablet (3.125 mg total) by  mouth daily. 02/12/21   Love, Ivan Anchors, PA-C  clopidogrel (PLAVIX) 75 MG tablet Take 1 tablet (75 mg total) by mouth daily. 02/11/21   Love, Ivan Anchors, PA-C  docusate sodium (COLACE) 100 MG capsule Take 1 capsule (100 mg total) by mouth daily. PURCHASE THIS OVER THE COUNTER 02/12/21   Love, Ivan Anchors, PA-C  escitalopram (LEXAPRO) 10 MG tablet Take 1 tablet (10 mg total) by mouth at bedtime. 02/11/21   Love, Ivan Anchors, PA-C  ezetimibe (ZETIA) 10 MG tablet Take 1 tablet (10 mg total) by mouth at bedtime. 02/11/21   Love, Ivan Anchors, PA-C  isosorbide mononitrate (IMDUR) 30 MG 24 hr tablet Take 0.5 tablets (15 mg total) by mouth daily. 02/12/21   Love, Ivan Anchors, PA-C  levETIRAcetam (KEPPRA) 500  MG tablet Take 1 tablet (500 mg total) by mouth 2 (two) times daily. 02/11/21   Love, Ivan Anchors, PA-C  lisinopril (ZESTRIL) 20 MG tablet Take 1 tablet (20 mg total) by mouth daily. 02/11/21   Love, Ivan Anchors, PA-C  metFORMIN (GLUCOPHAGE-XR) 500 MG 24 hr tablet Take 1 tablet (500 mg total) by mouth daily with breakfast. 02/11/21   Bary Leriche, PA-C  Nicotine 21-14-7 MG/24HR KIT Use as directed 02/19/20   Imogene Burn, PA-C  nitroGLYCERIN (NITROSTAT) 0.4 MG SL tablet Place 1 tablet (0.4 mg total) under the tongue every 5 (five) minutes x 3 doses as needed for chest pain. 06/11/16   Isaiah Serge, NP  pantoprazole (PROTONIX) 40 MG tablet Take 1 tablet (40 mg total) by mouth daily. 02/11/21   Love, Ivan Anchors, PA-C  polyethylene glycol (MIRALAX / GLYCOLAX) 17 g packet Take 17 g by mouth 2 (two) times daily. PURCHASE THIS OVER THE COUNTER 02/11/21   Love, Ivan Anchors, PA-C  topiramate (TOPAMAX) 25 MG tablet Take 3 tablets (75 mg total) by mouth 2 (two) times daily. 02/11/21   Love, Ivan Anchors, PA-C      Allergies  Allergen Reactions   Depakote [Divalproex Sodium] Other (See Comments)    Hyperammonemia   Other Other (See Comments)   Penicillins Hives, Other (See Comments) and Rash    Other reaction(s): GI intolerance Has patient  had a PCN reaction causing immediate rash, facial/tongue/throat swelling, SOB or lightheadedness with hypotension: YES Has patient had a PCN reaction causing severe rash involving mucus membranes or skin necrosis: NO Has patient had a PCN reaction that required hospitalizationNO Has patient had a PCN reaction occurring within the last 10 years: NO If all of the above answers are "NO", then may proceed with Cephalosporin use. Other reaction(s): GI Upset (intolerance) Other reaction(s): GI intolerance Has patient had a PCN reaction causing immediate rash, facial/tongue/throat swelling, SOB or lightheadedness with hypotension: YES Has patient had a PCN reaction causing severe rash involving mucus membranes or skin necrosis: NO Has patient had a PCN reaction that required hospitalizationNO Has patient had a PCN reaction occurring within the last 10 years: NO If all of the above answers are "NO", then may proceed with Cephalosporin use.    ROS:  Out of a complete 14 system review of symptoms, the patient complains only of the following symptoms, and all other reviewed systems are negative.  Walking difficulty Speech problem Memory problem  Height _0  (1.549 m), weight 147 lb 2 oz (66.7 kg).  Physical Exam  General: The patient is alert and cooperative at the time of the examination.  Eyes: Pupils are equal, round, and reactive to light. Discs are flat bilaterally.  Neck: The neck is supple, no carotid bruits are noted.  Respiratory: The respiratory examination is clear.  Cardiovascular: The cardiovascular examination reveals a regular rate and rhythm, no obvious murmurs or rubs are noted.  Skin: Extremities are without significant edema.  Neurologic Exam  Mental status: The patient is alert and oriented x 3 at the time of the examination. The patient has apparent normal recent and remote memory, with an apparently normal attention span and concentration ability.  Cranial nerves:  Facial symmetry is present. There is good sensation of the face to pinprick and soft touch bilaterally. The strength of the facial muscles and the muscles to head turning and shoulder shrug are normal bilaterally. Speech is slow, monosyllabic, ataxic, not aphasic. Extraocular movements are full. visual fields  are full.  The patient reports some visual blurring in the right eye only in the superior aspect of the visual field.  The tongue is midline, and the patient has symmetric elevation of the soft palate. No obvious hearing deficits are noted.  Motor: The motor testing reveals 5 over 5 strength of all 4 extremities. Good symmetric motor tone is noted throughout.  Sensory: Sensory testing is intact to pinprick, soft touch, vibration sensation, and position sense on all 4 extremities, with exception of decreased position sense in the right foot. No evidence of extinction is noted.  Coordination: Cerebellar testing reveals good finger-nose-finger and heel-to-shin on the left, significant dysmetria seen on the right.  Gait and station: Gait is wide-based, ataxic, some titubations are seen with axial muscles.  Tandem gait was not attempted.  Reflexes: Deep tendon reflexes are symmetric and normal bilaterally, with exception some decreased ankle jerk reflexes. Toes are downgoing bilaterally.   MRI brain 01/20/21:  IMPRESSION: 3 cm acute infarct right superior cerebellum. Smaller acute infarct right PICA territory. Small acute infarct left thalamus.   * MRI scan images were reviewed online. I agree with the written report.   2D echo 01/21/21:  IMPRESSIONS     1. Left ventricular ejection fraction, by estimation, is 60 to 65%. The  left ventricle has normal function. The left ventricle has no regional  wall motion abnormalities. There is mild left ventricular hypertrophy.  Left ventricular diastolic parameters  are consistent with Grade I diastolic dysfunction (impaired relaxation).   2. Right  ventricular systolic function is normal. The right ventricular  size is normal. Tricuspid regurgitation signal is inadequate for assessing  PA pressure.   3. The mitral valve is grossly normal. Trivial mitral valve  regurgitation. No evidence of mitral stenosis.   4. The aortic valve is tricuspid. Aortic valve regurgitation is not  visualized. No aortic stenosis is present.   5. The inferior vena cava is normal in size with greater than 50%  respiratory variability, suggesting right atrial pressure of 3 mmHg.    MRA neck 01/20/21:  IMPRESSION: Mild atherosclerotic disease in the carotid bowel bulb bilaterally without significant stenosis   Mild narrowing proximal right vertebral artery. This may be due to atherosclerotic disease or dissection. Given the posterior fossa infarcts, consider CT angio head and neck for further evaluation.   MRA head 01/20/21:  IMPRESSION: Both vertebral arteries patent to the basilar. Right PICA not visualized. Small left PICA. Right superior cerebral artery occluded proximally compatible with acute infarct.    Assessment/Plan:  1.  Right cerebellar stroke, right PICA stroke, left thalamic stroke, likely embolic  2.  Diabetes  3.  Hypertension  4.  Dyslipidemia  5.  Tobacco abuse  6.  History of seizures  7.  History of sleep apnea on CPAP  The patient has multiple risk factors for stroke.  She has had some worsening of memory since the stroke event, likely related to the left thalamic infarct.  She is still has significant gait instability issues with right hemiataxia syndrome.  She is undergoing speech, physical, and occupational therapy.  The patient will be set up for 30-day cardiac monitor study as the stroke was likely embolic.  She will remain on aspirin and Plavix for now.  She will follow-up here in about 4 months.  She will remain on Topamax and Keppra for the seizures.  We will follow the memory issues over time.  Jill Alexanders  MD 03/09/2021 9:47 AM  [facility] [address] [place], [place] [address]  Phone [phone] Fax [phone]

## 2021-03-10 ENCOUNTER — Ambulatory Visit: Payer: Medicare HMO

## 2021-03-10 ENCOUNTER — Ambulatory Visit: Payer: Medicare HMO | Admitting: Occupational Therapy

## 2021-03-10 ENCOUNTER — Encounter: Payer: Self-pay | Admitting: Occupational Therapy

## 2021-03-10 ENCOUNTER — Ambulatory Visit: Payer: Medicare HMO | Attending: Physician Assistant | Admitting: Physical Therapy

## 2021-03-10 ENCOUNTER — Other Ambulatory Visit: Payer: Self-pay

## 2021-03-10 DIAGNOSIS — R471 Dysarthria and anarthria: Secondary | ICD-10-CM

## 2021-03-10 DIAGNOSIS — R4701 Aphasia: Secondary | ICD-10-CM | POA: Insufficient documentation

## 2021-03-10 DIAGNOSIS — I69318 Other symptoms and signs involving cognitive functions following cerebral infarction: Secondary | ICD-10-CM

## 2021-03-10 DIAGNOSIS — R270 Ataxia, unspecified: Secondary | ICD-10-CM | POA: Diagnosis present

## 2021-03-10 DIAGNOSIS — R2681 Unsteadiness on feet: Secondary | ICD-10-CM | POA: Diagnosis present

## 2021-03-10 DIAGNOSIS — R2689 Other abnormalities of gait and mobility: Secondary | ICD-10-CM | POA: Diagnosis present

## 2021-03-10 DIAGNOSIS — R278 Other lack of coordination: Secondary | ICD-10-CM

## 2021-03-10 DIAGNOSIS — R41840 Attention and concentration deficit: Secondary | ICD-10-CM | POA: Insufficient documentation

## 2021-03-10 DIAGNOSIS — R41841 Cognitive communication deficit: Secondary | ICD-10-CM | POA: Insufficient documentation

## 2021-03-10 DIAGNOSIS — R41842 Visuospatial deficit: Secondary | ICD-10-CM

## 2021-03-10 DIAGNOSIS — R482 Apraxia: Secondary | ICD-10-CM | POA: Diagnosis present

## 2021-03-10 NOTE — Therapy (Signed)
[facility] [address] [place], [place], [address] Phone: [phone]   Fax:  [phone]  Speech Language Pathology Treatment  Patient Details  Name: Samantha Huffman MRN: [id number] Date of Birth: 1956/02/21 Referring Provider (SLP): Lauraine Rinne, [place]   Encounter Date: 03/10/2021   End of Session - 03/10/21 1233     Visit Number 2    Number of Visits 25    Date for SLP Re-Evaluation 05/20/21    SLP Start Time 1234    SLP Stop Time  5465    SLP Time Calculation (min) 41 min    Activity Tolerance Patient tolerated treatment well             Past Medical History:  Diagnosis Date   Carotid artery stenosis    1-39% bilateral stenosis by dopplers 08/2020   Coronary artery disease    a. s/p CABG 10/2012.  cath 05/2016 with moderate LM stenosis, mild LCx and RCA stenosis and occluded LAD with patent LIMA to LAD, free radial to OM.  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   Dyslipidemia    Family history of adverse reaction to anesthesia    mother and sister ponv   Generalized convulsive epilepsy without mention of intractable epilepsy    GERD (gastroesophageal reflux disease)    History of kidney stones    Hypertension    Memory deficit    slight   Migraine    "controlled on daily RX " (06/11/2016)   Nephrolithiasis 07/30/2015   S/p lithotripsy x 3 and right and left ureter stents   OSA (obstructive sleep apnea)    cpap    PONV (postoperative nausea and vomiting)    Seizures ([place])    "controlled w/daily RX; started in 2012; dr thinks they might be from the migraines; last sz was early part of 2016" (06/11/2016)   Type II diabetes mellitus ([place])    metformin   Vasovagal syncope 07/30/2015   2011    Past Surgical History:  Procedure Laterality Date   CARDIAC CATHETERIZATION  2014; 06/11/2016   CARDIAC CATHETERIZATION N/A 06/11/2016   Procedure: Left Heart Cath and Cors/Grafts Angiography;  Surgeon:  Sherren Mocha, MD;  Location: [facility];  Service: Cardiovascular;  Laterality: N/A;   CESAREAN SECTION     CORONARY ANGIOPLASTY     CORONARY ARTERY BYPASS GRAFT  March, 2014   LIMA to LAD, left radial to LCx x 2   CYSTOSCOPY W/ URETERAL STENT PLACEMENT Left 01/16/2019   Procedure: CYSTOSCOPY WITH RETROGRADE PYELOGRAM/URETERAL LEFT STENT PLACEMENT;  Surgeon: Ardis Hughs, MD;  [facility];  Service: Urology;  Laterality: Left;   CYSTOSCOPY/URETEROSCOPY/HOLMIUM LASER/STENT PLACEMENT Left 01/24/2019   Procedure: CYSTOSCOPY, LEFT URETEROSCOPY, HOLMIUM LASER, STONE REMOVAL AND STENT EXCHANGE;  Surgeon: Ardis Hughs, MD;  [facility];  Service: Urology;  Laterality: Left;   LITHOTRIPSY  X 3   RIGHT/LEFT HEART CATH AND CORONARY/GRAFT ANGIOGRAPHY N/A 09/08/2018   Procedure: RIGHT/LEFT HEART CATH AND CORONARY/GRAFT ANGIOGRAPHY;  Surgeon: Martinique, Peter M, MD;  Location: [facility];  Service: Cardiovascular;  Laterality: N/A;   TOTAL ABDOMINAL HYSTERECTOMY     ovaries took before hysterectomy   TUBAL LIGATION     URETERAL STENT PLACEMENT      There were no vitals filed for this visit.   Subjective Assessment - 03/10/21 1239     Subjective "I sound like a little kid"    Currently in Pain? No/denies  ADULT SLP TREATMENT - 03/10/21 1239       General Information   Behavior/Cognition Alert;Pleasant mood;Cooperative      Treatment Provided   Treatment provided Cognitive-Linquistic      Cognitive-Linquistic Treatment   Treatment focused on Aphasia;Dysarthria;Cognition    Skilled Treatment Pt reported she completed "list" with daughter as recommend during ST evaluation; however, list not provided this session. Pt indicated concern that she "sounds like a little kid." She reportedly repeats self when upset. Occasional dysluent speech with halting exhibited in conversation. Pt stated "I know what I want to say but I can't put it into  words." CLQT initiated this session (mazes and design generation to be completed); however, usual breaks required due to anxious behaviors. Mild language and moderate memory deficits identified thus far. CLQT to be completed and future analyzed next session.      Assessment / Recommendations / Plan   Plan Continue with current plan of care      Progression Toward Goals   Progression toward goals Progressing toward goals              SLP Education - 03/10/21 1409     Education Details CLQT results thus far    Person(s) Educated Patient    Methods Explanation;Demonstration    Comprehension Verbalized understanding;Returned demonstration;Need further instruction              SLP Short Term Goals - 03/10/21 1233       SLP SHORT TERM GOAL #1   Title pt will engage in cognitive linguistic and speech strength/coordination testing and goals added as necessary    Time 2    Period Weeks    Status On-going    Target Date 03/05/21      SLP SHORT TERM GOAL #2   Title using compensations, Samantha Huffman will remind family when she needs to take meds (pt is now dependent upon family) 80% of opportunities between 3 sessions    Time 4    Period Weeks    Status On-going    Target Date 03/20/21      SLP SHORT TERM GOAL #3   Title pt will name 10 items in a simple category with rare verbal cues x3 sessions    Time 4    Period Weeks    Status On-going    Target Date 03/20/21      SLP SHORT TERM GOAL #4   Title pt will independently use anomia compensations in 5 minutes simple conversation x3 sessions    Time 4    Period Weeks    Status On-going    Target Date 03/20/21      SLP SHORT TERM GOAL #5   Title pt will exhibit 100% intelligibility in 5 minutes simple conversation using speech compensations, x3 sessions    Time 4    Period Weeks    Status On-going    Target Date 03/20/21      SLP SHORT TERM GOAL #6   Title Samantha Huffman will complete a speech HEP with rare min A x2 sessions     Time 4    Period Weeks    Status On-going    Target Date 03/20/21              SLP Long Term Goals - 03/10/21 1233       SLP LONG TERM GOAL #1   Title pt will complete speech HEP independently x3 sessions    Time 8    Period Weeks  Status On-going      SLP LONG TERM GOAL #2   Title in 10 minutes simple conversation pt will exhibit functional fluid speech using compensations in 2 sessions    Time 8    Period Weeks    Status On-going      SLP LONG TERM GOAL #3   Title in 10 minutes mod complex conversation pt will exhibit functional fluid speech using compensations in 2 sessions    Time 12    Period Weeks    Status On-going      SLP LONG TERM GOAL #4   Title pt will complete VNeST and SFA tasks with rare min A in 3 sessions    Time 8    Period Weeks    Status On-going      SLP LONG TERM GOAL #5   Title Samantha Huffman will exhibit functional word-finding using compensations in 10 mintues mod complex conversation in 3 sessions    Time 12    Period Weeks    Status On-going      SLP LONG TERM GOAL #6   Title Samantha Huffman will independently take meds for 2 weeks as reported by Samantha Huffman or family    Time 12    Period Weeks    Status On-going              Plan - 03/10/21 1410     Clinical Impression Statement Samantha Huffman presents today with anomia as diagnosed by Samantha Huffman, and s/sx cognitive communication deficits in attention, awareness, and memory. CLQT intiated this session, with mild language and moderate language deficits indentified thus far. Remaining subtests to be completed next session, as well as oral motor assessment to evaluate speech and identify dysarthria or apraxia of speech. Occasional s/sx of dysluent speech exhibited in conversation this session. Pt used to manage her own meds and live independently and now lives with daughter. She would benefit from skilled ST targeting her deficit areas.    Speech Therapy Frequency 2x / week    Duration 12 weeks     Treatment/Interventions Language facilitation;Cueing hierarchy;SLP instruction and feedback;Cognitive reorganization;Compensatory strategies;Internal/external aids;Patient/family education;Functional tasks;Environmental controls;Oral motor exercises    Potential to Achieve Goals Good    Consulted and Agree with Plan of Care Patient             Patient will benefit from skilled therapeutic intervention in order to improve the following deficits and impairments:   Aphasia  Cognitive communication deficit  Dysarthria and anarthria    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[place]) 03/09/2021   Cerebellar stroke ([place]) 01/30/2021   Acute cerebrovascular accident (CVA) ([place]) 01/20/2021   Acute CVA (cerebrovascular accident) ([place]) 01/20/2021   GERD (gastroesophageal reflux disease)    Coronary artery disease    Depression 01/06/2020   Pneumonia 01/05/2020   History of chest pain 09/26/2018   Dyspnea on exertion 09/08/2018   Chest pain, rule out acute myocardial infarction 10/30/2017   Edema 01/25/2017   Tobacco abuse 06/10/2016   Headache 09/15/2015   OSA (obstructive sleep apnea) 07/30/2015   Nephrolithiasis 07/30/2015   Vasovagal syncope 07/30/2015   Carotid artery stenosis, asymptomatic 07/30/2015   CAD (coronary artery disease) 08/22/2013   Essential hypertension 08/22/2013   Diabetes mellitus without complication ([place])    Dyslipidemia    Seizures ([place])    Generalized convulsive epilepsy ([place]) 03/22/2013   Memory deficit 03/22/2013    Alinda Deem, MA  CCC-SLP 03/10/2021, 2:15 PM  [facility] [address] [place], [place], [address] Phone: [phone]   Fax:  [phone]   Name: Gerardine Peltz MRN: [id number] Date of Birth: 1956/06/03

## 2021-03-10 NOTE — Patient Instructions (Signed)
  Bring back your homework from the speech therapy evaluation

## 2021-03-10 NOTE — Therapy (Addendum)
[facility] [address] [place], [place], [address] Phone: [phone]   Fax:  [phone]  Occupational Therapy Treatment  Patient Details  Name: Samantha Huffman MRN: [id number] Date of Birth: [birth year] Referring Provider (OT): Samantha Sensing, NP   Encounter Date: 03/10/2021   OT End of Session - 03/10/21 1323     Visit Number 2    Number of Visits 25    Date for OT Re-Evaluation 06/08/21    Authorization Type [employer] Medicare (recert 1/60/73 due to delay in start of OT due to schedule conflicts)    Authorization - Visit Number 2    Authorization - Number of Visits 10    Progress Note Due on Visit 10    OT Start Time 1318    OT Stop Time 1400    OT Time Calculation (min) 42 min    Equipment Utilized During Treatment **FOTO**    Activity Tolerance Patient tolerated treatment well    Behavior During Therapy [facility] for tasks assessed/performed             Past Medical History:  Diagnosis Date   Carotid artery stenosis    1-39% bilateral stenosis by dopplers 08/2020   Coronary artery disease    a. s/p CABG 10/2012.  cath 05/2016 with moderate LM stenosis, mild LCx and RCA stenosis and occluded LAD with patent LIMA to LAD, free radial to OM.  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   Dyslipidemia    Family history of adverse reaction to anesthesia    mother and sister ponv   Generalized convulsive epilepsy without mention of intractable epilepsy    GERD (gastroesophageal reflux disease)    History of kidney stones    Hypertension    Memory deficit    slight   Migraine    "controlled on daily RX " (06/11/2016)   Nephrolithiasis 07/30/2015   S/p lithotripsy x 3 and right and left ureter stents   OSA (obstructive sleep apnea)    cpap    PONV (postoperative nausea and vomiting)    Seizures ([place])    "controlled w/daily RX; started in 2012; dr thinks they might be from the migraines; last sz was early part of 2016"  (06/11/2016)   Type II diabetes mellitus ([place])    metformin   Vasovagal syncope 07/30/2015   2011    Past Surgical History:  Procedure Laterality Date   CARDIAC CATHETERIZATION  2014; 06/11/2016   CARDIAC CATHETERIZATION N/A 06/11/2016   Procedure: Left Heart Cath and Cors/Grafts Angiography;  Surgeon: Sherren Mocha, MD;  Location: [place] [facility];  Service: Cardiovascular;  Laterality: N/A;   CESAREAN SECTION     CORONARY ANGIOPLASTY     CORONARY ARTERY BYPASS GRAFT  March, 2014   LIMA to LAD, left radial to LCx x 2   CYSTOSCOPY W/ URETERAL STENT PLACEMENT Left 01/16/2019   Procedure: CYSTOSCOPY WITH RETROGRADE PYELOGRAM/URETERAL LEFT STENT PLACEMENT;  Surgeon: Ardis Hughs, MD;  [facility];  Service: Urology;  Laterality: Left;   CYSTOSCOPY/URETEROSCOPY/HOLMIUM LASER/STENT PLACEMENT Left 01/24/2019   Procedure: CYSTOSCOPY, LEFT URETEROSCOPY, HOLMIUM LASER, STONE REMOVAL AND STENT EXCHANGE;  Surgeon: Ardis Hughs, MD;  [facility];  Service: Urology;  Laterality: Left;   LITHOTRIPSY  X 3   RIGHT/LEFT HEART CATH AND CORONARY/GRAFT ANGIOGRAPHY N/A 09/08/2018   Procedure: RIGHT/LEFT HEART CATH AND CORONARY/GRAFT ANGIOGRAPHY;  Surgeon: Martinique, Peter M, MD;  Location: [place] [facility];  Service: Cardiovascular;  Laterality: N/A;  TOTAL ABDOMINAL HYSTERECTOMY     ovaries took before hysterectomy   TUBAL LIGATION     URETERAL STENT PLACEMENT      There were no vitals filed for this visit.   Subjective Assessment - 03/10/21 1316     Subjective  Pt reports that neurologist may add medication once he talks to eye doctor.  Pt reports eating with RUE (with difficulty), folding towels/washcloths, and washing with RUE.  "I have to take my time it shakes sometimes."    Patient is accompanied by: Family member   dtr--Samantha Huffman   Pertinent History CVA with R-sided weakness/ataxia.  PMH:  CAD, HTN, DM, OSA, seizure disorder, dyslipidemia, migraine, CABG (2014), recent bilateral  cataract surgeries just prior to CVA    Limitations fall risk, ataxia, visual deficits    Patient Stated Goals return to driving, return to living alone, cook    Currently in Pain? No/denies                [facility] OT Assessment - 03/10/21 0001       Coordination   Box and Blocks R-42blocks, L-51blocks              Folding washcloth with BUEs  with good control.  Pt reports that she is performing at home.              OT Education - 03/10/21 1339     Education Details HEP for RUE control/coordination--see pt instructions    Person(s) Educated Patient    Methods Explanation;Demonstration;Verbal cues;Handout    Comprehension Verbalized understanding;Returned demonstration;Verbal cues required              OT Short Term Goals - 03/10/21 1331       OT SHORT TERM GOAL #1   Title Pt will be independent with initial HEP.--Check initial STGs (#1-4)  04/09/21    Time 4    Period Weeks    Status New      OT SHORT TERM GOAL #2   Title Pt will demo improved coordination and functional reach for ADLs as shown by improving score on box and blocks test by at least 6.    Baseline 42 blocks    Time 4    Period Weeks    Status New      OT SHORT TERM GOAL #3   Title Pt will perform bathing with min A.    Time 4    Period Weeks    Status New      OT SHORT TERM GOAL #4   Title Pt will perofrm UB dressing mod I.    Time 4    Period Weeks    Status New      OT SHORT TERM GOAL #5   Title Pt will perform LB dressing with min A.--check STGs #5-8  05/10/21    Time 8    Period Weeks    Status New      OT SHORT TERM GOAL #6   Title Pt will perform bathing with supervision.    Time 8    Period Weeks    Status New      OT SHORT TERM GOAL #7   Title Pt will peform simple home maintenane task and snack prep mod I.    Time 8    Period Weeks    Status New      OT SHORT TERM GOAL #8   Title Pt will cut food mod I using AE prn.  Time 8    Period Weeks     Status New               OT Long Term Goals - 03/10/21 1519       OT LONG TERM GOAL #1   Title Pt will be independent with updated HEP--check LTGs 06/08/21    Time 12    Period Weeks    Status New      OT LONG TERM GOAL #2   Title Pt will perform BADLs mod I with AE/DME and strategies prn.    Time 12    Period Weeks    Status New      OT LONG TERM GOAL #3   Title Pt wil be for memory/cognitive compensation strategies for ADL/IADLs prn.    Time 12    Period Weeks    Status New      OT LONG TERM GOAL #4   Title Pt will perform simple cooking task with supervision.    Time 12    Period Weeks    Status New      OT LONG TERM GOAL #5   Title Pt will perform environmental scanning/navigation with at least 90% accuracy (with head turns) without reports of dizziness/LOB.    Time 12    Period Weeks    Status New      OT LONG TERM GOAL #6   Title Pt will improve RUE coordination as shown by completing 9-hole peg test in less than 36sec without drops.    Time 12    Period Weeks    Status New                   Plan - 03/10/21 1323     Clinical Impression Statement Re-cert to extend end date due to delay in start of OT POC due to scheduling conflicts.  Pt is progressing towards goals with improving RUE control and functional use.    OT Occupational Profile and History Detailed Assessment- Review of Records and additional review of physical, cognitive, psychosocial history related to current functional performance    Occupational performance deficits (Please refer to evaluation for details): ADL's;IADL's;Leisure;Social Participation    Body Structure / Function / Physical Skills ADL;Decreased knowledge of use of DME;Strength;GMC;Dexterity;Balance;UE functional use;IADL;Endurance;ROM;Vision;Improper spinal/pelvic alignment;Coordination;Mobility;FMC    Cognitive Skills Attention;Memory;Understand    Rehab Potential Good    Clinical Decision Making Several treatment  options, min-mod task modification necessary    Comorbidities Affecting Occupational Performance: May have comorbidities impacting occupational performance    Modification or Assistance to Complete Evaluation  Min-Moderate modification of tasks or assist with assess necessary to complete eval    OT Frequency 2x / week    OT Duration 12 weeks   +eval   OT Treatment/Interventions Self-care/ADL training;Moist Heat;DME and/or AE instruction;Balance training;Aquatic Therapy;Therapeutic activities;Cognitive remediation/compensation;Therapeutic exercise;Cryotherapy;Neuromuscular education;Functional Mobility Training;Passive range of motion;Visual/perceptual remediation/compensation;Patient/family education;Manual Therapy;Energy conservation;Paraffin;Fluidtherapy    Plan Closed-chain shoulder flex, wt. bearing/scapular stabilization HEP    Consulted and Agree with Plan of Care Patient;Family member/caregiver    Family Member Consulted dtr             Patient will benefit from skilled therapeutic intervention in order to improve the following deficits and impairments:   Body Structure / Function / Physical Skills: ADL, Decreased knowledge of use of DME, Strength, GMC, Dexterity, Balance, UE functional use, IADL, Endurance, ROM, Vision, Improper spinal/pelvic alignment, Coordination, Mobility, [facility] Cognitive Skills: Attention, Memory, Understand     Visit  Diagnosis: Other lack of coordination - Plan: Ot plan of care cert/re-cert  Ataxia - Plan: Ot plan of care cert/re-cert  Unsteadiness on feet - Plan: Ot plan of care cert/re-cert  Visuospatial deficit - Plan: Ot plan of care cert/re-cert  Attention and concentration deficit - Plan: Ot plan of care cert/re-cert  Other symptoms and signs involving cognitive functions following cerebral infarction - Plan: Ot plan of care cert/re-cert  Other abnormalities of gait and mobility - Plan: Ot plan of care cert/re-cert    Problem List Patient  Active Problem List   Diagnosis Date Noted   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) 01/20/2021   GERD (gastroesophageal reflux disease)    Coronary artery disease    Depression 01/06/2020   Pneumonia 01/05/2020   History of chest pain 09/26/2018   Dyspnea on exertion 09/08/2018   Chest pain, rule out acute myocardial infarction 10/30/2017   Edema 01/25/2017   Tobacco abuse 06/10/2016   Headache 09/15/2015   OSA (obstructive sleep apnea) 07/30/2015   Nephrolithiasis 07/30/2015   Vasovagal syncope 07/30/2015   Carotid artery stenosis, asymptomatic 07/30/2015   CAD (coronary artery disease) 08/22/2013   Essential hypertension 08/22/2013   Diabetes mellitus without complication ([place])    Dyslipidemia    Seizures ([place])    Generalized convulsive epilepsy ([place]) 03/22/2013   Memory deficit 03/22/2013    [facility] 03/10/2021, 3:20 PM  [facility] [address] [place], [place], [address] Phone: [phone]   Fax:  [phone]  Name: Samantha Huffman MRN: [id number] Date of Birth: June 17, 1956  Vianne Bulls, OTR/L [facility] [address] [place], [place]  [address] [phone] phone [phone] 03/10/21 3:20 PM

## 2021-03-10 NOTE — Patient Instructions (Addendum)
  With your right hand/arm, perform the following for at least 20 min per day.  Go slow and work on control.  Keep shoulder down.  If you start shaking, try to stop take a breath and relax shoulder before restarting.    Line up 3-5 up bottles (empty) on table away from you,  then reach out to touch each one slowly and lightly, trying not to move them.  Repeat 10x  Pick up empty, plastic/paper cup, bring it to you mouth like you are going to take a drink.  Repeat 10x  Flip playing cards over 1 at a time.  Pick up coins and place in container or coin back (make sure coin bank is away from you so you have to reach).  Make sure you watch your hand and try to lift off coin bank after releasing coin.    Wipe table top forward/back and side to side x15-20 each.  Stack blocks, dominoes, or checkers.

## 2021-03-11 NOTE — Therapy (Signed)
[facility] [address] [place], [place], [address] Phone: [phone]   Fax:  [phone]  Physical Therapy Treatment  Patient Details  Name: Samantha Huffman MRN: [id number] Date of Birth: 1956/02/09 Referring Provider (PT): Lauraine Rinne, PA-C   Encounter Date: 03/10/2021   PT End of Session - 03/11/21 1407     Visit Number 2    Number of Visits 25    Date for PT Re-Evaluation 05/15/21    Authorization Type Aetna Medicare    Authorization Time Period 02-19-21 - 05-21-21    Progress Note Due on Visit 10    PT Start Time 1150    PT Stop Time 1230    PT Time Calculation (min) 40 min    Equipment Utilized During Treatment Gait belt    Activity Tolerance Patient tolerated treatment well    Behavior During Therapy [facility] for tasks assessed/performed             Past Medical History:  Diagnosis Date   Carotid artery stenosis    1-39% bilateral stenosis by dopplers 08/2020   Coronary artery disease    a. s/p CABG 10/2012.  cath 05/2016 with moderate LM stenosis, mild LCx and RCA stenosis and occluded LAD with patent LIMA to LAD, free radial to OM.  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   Dyslipidemia    Family history of adverse reaction to anesthesia    mother and sister ponv   Generalized convulsive epilepsy without mention of intractable epilepsy    GERD (gastroesophageal reflux disease)    History of kidney stones    Hypertension    Memory deficit    slight   Migraine    "controlled on daily RX " (06/11/2016)   Nephrolithiasis 07/30/2015   S/p lithotripsy x 3 and right and left ureter stents   OSA (obstructive sleep apnea)    cpap    PONV (postoperative nausea and vomiting)    Seizures ([place])    "controlled w/daily RX; started in 2012; dr thinks they might be from the migraines; last sz was early part of 2016" (06/11/2016)   Type II diabetes mellitus ([place])    metformin   Vasovagal syncope 07/30/2015    2011    Past Surgical History:  Procedure Laterality Date   CARDIAC CATHETERIZATION  2014; 06/11/2016   CARDIAC CATHETERIZATION N/A 06/11/2016   Procedure: Left Heart Cath and Cors/Grafts Angiography;  Surgeon: Sherren Mocha, MD;  Location: [place] [facility];  Service: Cardiovascular;  Laterality: N/A;   CESAREAN SECTION     CORONARY ANGIOPLASTY     CORONARY ARTERY BYPASS GRAFT  March, 2014   LIMA to LAD, left radial to LCx x 2   CYSTOSCOPY W/ URETERAL STENT PLACEMENT Left 01/16/2019   Procedure: CYSTOSCOPY WITH RETROGRADE PYELOGRAM/URETERAL LEFT STENT PLACEMENT;  Surgeon: Ardis Hughs, MD;  [facility];  Service: Urology;  Laterality: Left;   CYSTOSCOPY/URETEROSCOPY/HOLMIUM LASER/STENT PLACEMENT Left 01/24/2019   Procedure: CYSTOSCOPY, LEFT URETEROSCOPY, HOLMIUM LASER, STONE REMOVAL AND STENT EXCHANGE;  Surgeon: Ardis Hughs, MD;  [facility];  Service: Urology;  Laterality: Left;   LITHOTRIPSY  X 3   RIGHT/LEFT HEART CATH AND CORONARY/GRAFT ANGIOGRAPHY N/A 09/08/2018   Procedure: RIGHT/LEFT HEART CATH AND CORONARY/GRAFT ANGIOGRAPHY;  Surgeon: Martinique, Peter M, MD;  Location: [place] [facility];  Service: Cardiovascular;  Laterality: N/A;   TOTAL ABDOMINAL HYSTERECTOMY     ovaries took before hysterectomy   TUBAL LIGATION  URETERAL STENT PLACEMENT      There were no vitals filed for this visit.   Subjective Assessment - 03/10/21 1159     Subjective Pt saw Dr. Jannifer Franklin yesterday - says he asked her about her Rt eye - says that CVA affected her eye - has to wear heart monitor for 30 days    Patient is accompained by: Family member   Kia   Pertinent History Carotid artery stenosis, HTN, epilepsy with seizure disorder - last seizure was in early 2016:  migraines, DM, memory deficit, CAD,    Diagnostic tests CT of head and neck:  MRA    Patient Stated Goals Improve balance and walking    Currently in Pain? No/denies                                [facility] Adult PT Treatment/Exercise - 03/11/21 0001       Transfers   Transfers Sit to Stand;Stand to Sit    Sit to Stand 4: Min guard    Number of Reps Other reps (comment)   5   Comments no UE support used - from mat table      Ambulation/Gait   Ambulation/Gait Yes    Ambulation/Gait Assistance 4: Min assist;4: Min guard   min assist needed initially - balance improved with increased amb. and only min guard needed   Ambulation/Gait Assistance Details cues for increased step length and arm swing    Ambulation Distance (Feet) 230 Feet   amb. 100' from gym to [place] office with min guard assist   Assistive device None    Gait Pattern Ataxic    Ambulation Surface Level;Indoor      Exercises   Exercises Knee/Hip      Knee/Hip Exercises: Standing   Forward Step Up Right;Left;1 set;10 reps;Hand Hold: 1   with CGA                Balance Exercises - 03/11/21 0001       Balance Exercises: Standing   SLS Eyes open;Solid surface;2 reps   10 sec hold attempted - UE support needed   Stepping Strategy Anterior;Posterior;Lateral;5 reps   with UE support at counter   Step Over Hurdles / Cones stepping over lower orange hurdle with UE support prn with min assist for balance recovery 5 reps each foot; tapping 2 cones 5 reps each foot with min assist    Other Standing Exercises Pt performed tap ups alternating feet to 6" step 10 reps each foot - with min assist for balance recovery - no UE support used on rails;            Pt performed standing alternating forward, back and side kicks 10 reps each leg with UE support on counter     PT Short Term Goals - 03/11/21 1413       PT SHORT TERM GOAL #1   Title Improve Berg score from 34/56 to >/= 39/56 for reduced fall risk.    Baseline 34/56 - 02-19-21    Time 4    Period Weeks    Status New    Target Date 03/20/21      PT SHORT TERM GOAL #2   Title Improve TUG score from 17.32 secs to </= 14.5  secs without use of device to increase safety with ambulation.    Baseline 17.32 secs    Time 4    Period Weeks  Status New    Target Date 03/20/21      PT SHORT TERM GOAL #3   Title Increase gait velocity from 2.25 ft/sec without device to >/= 2.6 ft/sec with CGA for incr. gait efficiency.    Baseline 2.25 ft/sec = 14.57 secs    Time 4    Period Weeks    Status New    Target Date 03/20/21      PT SHORT TERM GOAL #4   Title Assess DGI and establish goal as appropriate.    Time 4    Period Weeks    Status New    Target Date 03/20/21      PT SHORT TERM GOAL #5   Title Pt wil amb. 350' with CGA on flat, even surface without LOB.    Time 4    Period Weeks    Status New    Target Date 03/20/21      PT SHORT TERM GOAL #6   Title Independent in HEP for balance and strengthening exercises.    Time 4    Period Weeks    Status New    Target Date 03/20/21               PT Long Term Goals - 03/11/21 1413       PT LONG TERM GOAL #1   Title Improve Berg score from 34/56 to >/= 50/56 to reduce fall risk.    Baseline 34/56 on 02-19-21    Time 12    Period Weeks    Status New      PT LONG TERM GOAL #2   Title Pt will ambulate 1000' modified independently without device on even and uneven surfaces.    Time 12    Period Weeks    Status New      PT LONG TERM GOAL #3   Title Improve TUG score from 17.32 secs to </= 13 secs to reduce fall risk.    Baseline 17.32 secs on 02-19-21 with CGA    Time 12    Period Weeks    Status New      PT LONG TERM GOAL #4   Title Improve DGI by at least 10 points to increase safety with gait.    Time 12    Period Weeks    Status New      PT LONG TERM GOAL #5   Title Independent in updated HEP as appropriate.    Time 12    Period Weeks    Status New      PT LONG TERM GOAL #6   Title Increase LE FOTO score by at least 10 points to demo improvement in functional status.    Baseline 59/100 on 02-19-21    Time 12    Period Weeks     Status New                   Plan - 03/11/21 1408     Clinical Impression Statement Pt's gait improved with less ataxia noted after amb. approx. 20'- 30'; gait was also improved with cues to increase step length and arm swing.  Pt did well maintaining balance and stability in quadruped position - able to lift contralateral UE and LE without LOB.  Pt has greater difficulty with RLE SLS than with LLE SLS.  Cont with POC.    Personal Factors and Comorbidities Comorbidity 2;Past/Current Experience;Transportation    Comorbidities migraines, epilepsy with seizure disorder, DM, CAD, s/p cataract sx  on bil. eyes, memory deficit    Examination-Activity Limitations Bathing;Stand;Transfers;Carry;Squat;Stairs;Locomotion Level;Hygiene/Grooming;Bend    Examination-Participation Restrictions Cleaning;Community Activity;Driving;Laundry;Shop;Meal Prep    Stability/Clinical Decision Making Evolving/Moderate complexity    Rehab Potential Good    PT Frequency 2x / week    PT Duration 12 weeks    PT Treatment/Interventions ADLs/Self Care Home Management;Aquatic Therapy;DME Instruction;Gait training;Stair training;Therapeutic activities;Therapeutic exercise;Balance training;Neuromuscular re-education;Patient/family education    PT Next Visit Plan issue balance HEP (check SLS & marching exs as instructed on 02-19-21);  balance & gait training - pt to bring RW to next session    PT Home Exercise Plan balance    Consulted and Agree with Plan of Care Patient             Patient will benefit from skilled therapeutic intervention in order to improve the following deficits and impairments:  Abnormal gait, Decreased activity tolerance, Decreased endurance, Decreased balance, Decreased coordination, Decreased strength, Impaired UE functional use  Visit Diagnosis: Other abnormalities of gait and mobility  Other lack of coordination  Unsteadiness on feet     Problem List Patient Active Problem List    Diagnosis Date Noted   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[place]) 03/09/2021   Cerebellar stroke ([place]) 01/30/2021   Acute cerebrovascular accident (CVA) ([place]) 01/20/2021   Acute CVA (cerebrovascular accident) ([place]) 01/20/2021   GERD (gastroesophageal reflux disease)    Coronary artery disease    Depression 01/06/2020   Pneumonia 01/05/2020   History of chest pain 09/26/2018   Dyspnea on exertion 09/08/2018   Chest pain, rule out acute myocardial infarction 10/30/2017   Edema 01/25/2017   Tobacco abuse 06/10/2016   Headache 09/15/2015   OSA (obstructive sleep apnea) 07/30/2015   Nephrolithiasis 07/30/2015   Vasovagal syncope 07/30/2015   Carotid artery stenosis, asymptomatic 07/30/2015   CAD (coronary artery disease) 08/22/2013   Essential hypertension 08/22/2013   Diabetes mellitus without complication ([place])    Dyslipidemia    Seizures ([place])    Generalized convulsive epilepsy ([place]) 03/22/2013   Memory deficit 03/22/2013    Alda Lea, PT 03/11/2021, 2:15 PM  [facility] [address] [place], [place], [address] Phone: [phone]   Fax:  [phone]  Name: Samantha Huffman MRN: [id number] Date of Birth: 09/26/1955

## 2021-03-12 ENCOUNTER — Ambulatory Visit: Payer: Medicare HMO | Admitting: Occupational Therapy

## 2021-03-12 ENCOUNTER — Ambulatory Visit: Payer: Medicare HMO

## 2021-03-12 ENCOUNTER — Other Ambulatory Visit: Payer: Self-pay

## 2021-03-12 ENCOUNTER — Encounter: Payer: Self-pay | Admitting: Occupational Therapy

## 2021-03-12 ENCOUNTER — Other Ambulatory Visit: Payer: Self-pay | Admitting: Cardiology

## 2021-03-12 ENCOUNTER — Other Ambulatory Visit: Payer: Self-pay | Admitting: *Deleted

## 2021-03-12 ENCOUNTER — Ambulatory Visit: Payer: Medicare HMO | Admitting: Physical Therapy

## 2021-03-12 ENCOUNTER — Telehealth: Payer: Self-pay | Admitting: Cardiology

## 2021-03-12 DIAGNOSIS — R278 Other lack of coordination: Secondary | ICD-10-CM

## 2021-03-12 DIAGNOSIS — R41842 Visuospatial deficit: Secondary | ICD-10-CM

## 2021-03-12 DIAGNOSIS — R4701 Aphasia: Secondary | ICD-10-CM

## 2021-03-12 DIAGNOSIS — R41841 Cognitive communication deficit: Secondary | ICD-10-CM

## 2021-03-12 DIAGNOSIS — R270 Ataxia, unspecified: Secondary | ICD-10-CM

## 2021-03-12 DIAGNOSIS — R41840 Attention and concentration deficit: Secondary | ICD-10-CM

## 2021-03-12 DIAGNOSIS — R2689 Other abnormalities of gait and mobility: Secondary | ICD-10-CM

## 2021-03-12 DIAGNOSIS — R2681 Unsteadiness on feet: Secondary | ICD-10-CM

## 2021-03-12 DIAGNOSIS — R471 Dysarthria and anarthria: Secondary | ICD-10-CM

## 2021-03-12 DIAGNOSIS — I69318 Other symptoms and signs involving cognitive functions following cerebral infarction: Secondary | ICD-10-CM

## 2021-03-12 MED ORDER — PANTOPRAZOLE SODIUM 40 MG PO TBEC: 40.0000 mg | DELAYED_RELEASE_TABLET | Freq: Every day | ORAL | 2 refills | Status: DC

## 2021-03-12 MED ORDER — ISOSORBIDE MONONITRATE ER 30 MG PO TB24: 15.0000 mg | ORAL_TABLET | Freq: Every day | ORAL | 2 refills | Status: DC

## 2021-03-12 MED ORDER — CARVEDILOL 3.125 MG PO TABS: 3.1250 mg | ORAL_TABLET | Freq: Every day | ORAL | 3 refills | Status: DC

## 2021-03-12 NOTE — Telephone Encounter (Signed)
*  STAT* If patient is at the pharmacy, call can be transferred to refill team.   1. Which medications need to be refilled? (please list name of each medication and dose if known) escitalopram (LEXAPRO) 10 MG tablet carvedilol (COREG) 3.125 MG tablet isosorbide mononitrate (IMDUR) 30 MG 24 hr tablet pantoprazole (PROTONIX) 40 MG tablet   2. Which pharmacy/location (including street and city if local pharmacy) is medication to be sent to? [id number] [address], [place]  3. Do they need a 30 day or 90 day supply? 90 day supply

## 2021-03-12 NOTE — Patient Instructions (Signed)
Standing Marching   Using a chair if necessary, march in place. Repeat 10 times. Do 1 sessions per day.  http://gt2.exer.us/344   Copyright  VHI. All rights reserved.   Hip Backward Kick   Using a chair for balance, keep legs shoulder width apart and toes pointed for- ward. Slowly extend one leg back, keeping knee straight. Do not lean forward. Repeat with other leg. Repeat 10 times. Do 1 sessions per day.  http://gt2.exer.us/340   Copyright  VHI. All rights reserved.     Hip Side Kick   Holding a chair for balance, keep legs shoulder width apart and toes pointed forward. Swing a leg out to side, keeping knee straight. Do not lean. Repeat using other leg. Repeat 10 times. Do 1 sessions per day.     Also do forward kicks - 10 reps each leg - ALTERNATING         Standing On One Leg Without Support .  Stand on one leg in neutral spine without support. Hold 10  seconds. Repeat on other leg. Do 2 repetitions, 1 sets.   Side-Stepping   Walk to left side with eyes open. Take even steps, leading with same foot. Make sure each foot lifts off the floor. Repeat in opposite direction. Repeat for 3 - 4 times along counter.  Marland Kitchen

## 2021-03-12 NOTE — Therapy (Signed)
[facility] [address] [place], [place], [address] Phone: [phone]   Fax:  [phone]  Occupational Therapy Treatment  Patient Details  Name: Samantha Huffman MRN: [id number] Date of Birth: Jan 09, 1956 Referring Provider (OT): Danella Sensing, NP   Encounter Date: 03/12/2021   OT End of Session - 03/12/21 1453     Visit Number 3    Number of Visits 25    Date for OT Re-Evaluation 06/08/21    Authorization - Visit Number 3    Authorization - Number of Visits 10    Progress Note Due on Visit 10    OT Start Time 5852    OT Stop Time 7782    OT Time Calculation (min) 38 min             Past Medical History:  Diagnosis Date   Carotid artery stenosis    1-39% bilateral stenosis by dopplers 08/2020   Coronary artery disease    a. s/p CABG 10/2012.  cath 05/2016 with moderate LM stenosis, mild LCx and RCA stenosis and occluded LAD with patent LIMA to LAD, free radial to OM.  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   Dyslipidemia    Family history of adverse reaction to anesthesia    mother and sister ponv   Generalized convulsive epilepsy without mention of intractable epilepsy    GERD (gastroesophageal reflux disease)    History of kidney stones    Hypertension    Memory deficit    slight   Migraine    "controlled on daily RX " (06/11/2016)   Nephrolithiasis 07/30/2015   S/p lithotripsy x 3 and right and left ureter stents   OSA (obstructive sleep apnea)    cpap    PONV (postoperative nausea and vomiting)    Seizures ([place])    "controlled w/daily RX; started in 2012; dr thinks they might be from the migraines; last sz was early part of 2016" (06/11/2016)   Type II diabetes mellitus ([place])    metformin   Vasovagal syncope 07/30/2015   2011    Past Surgical History:  Procedure Laterality Date   CARDIAC CATHETERIZATION  2014; 06/11/2016   CARDIAC CATHETERIZATION N/A 06/11/2016   Procedure: Left Heart Cath  and Cors/Grafts Angiography;  Surgeon: Sherren Mocha, MD;  Location: [facility];  Service: Cardiovascular;  Laterality: N/A;   CESAREAN SECTION     CORONARY ANGIOPLASTY     CORONARY ARTERY BYPASS GRAFT  March, 2014   LIMA to LAD, left radial to LCx x 2   CYSTOSCOPY W/ URETERAL STENT PLACEMENT Left 01/16/2019   Procedure: CYSTOSCOPY WITH RETROGRADE PYELOGRAM/URETERAL LEFT STENT PLACEMENT;  Surgeon: Ardis Hughs, MD;  Location: [facility];  Service: Urology;  Laterality: Left;   CYSTOSCOPY/URETEROSCOPY/HOLMIUM LASER/STENT PLACEMENT Left 01/24/2019   Procedure: CYSTOSCOPY, LEFT URETEROSCOPY, HOLMIUM LASER, STONE REMOVAL AND STENT EXCHANGE;  Surgeon: Ardis Hughs, MD;  Location: [facility];  Service: Urology;  Laterality: Left;   LITHOTRIPSY  X 3   RIGHT/LEFT HEART CATH AND CORONARY/GRAFT ANGIOGRAPHY N/A 09/08/2018   Procedure: RIGHT/LEFT HEART CATH AND CORONARY/GRAFT ANGIOGRAPHY;  Surgeon: Martinique, Peter M, MD;  Location: [facility];  Service: Cardiovascular;  Laterality: N/A;   TOTAL ABDOMINAL HYSTERECTOMY     ovaries took before hysterectomy   TUBAL LIGATION     URETERAL STENT PLACEMENT      There were no vitals filed for this visit.   Subjective Assessment - 03/12/21 1452  Subjective  Pt reports using her right arm more    Pertinent History CVA with R-sided weakness/ataxia.  PMH:  CAD, HTN, DM, OSA, seizure disorder, dyslipidemia, migraine, CABG (2014), recent bilateral cataract surgeries just prior to CVA    Patient Stated Goals return to driving, return to living alone, cook                   Treatment; supine closed chain shoulder flexion and chest press, min v.c for RUE control, followed by seated low range closed chain shoulder flexion and abduction, min v.c Pt practiced stirring water in a coffee cup with left and right UE's , cues to steady right elbow on the table Pt ambulated without walker and attempted to carry a mug of water. Pt was too  unsteady without walker, so pt was shown walker tray. Pt was able to safely ambulate with mug on walker tray.  Therapist recommends for use at home Flipping playing cards with RUE, min-mod v.c                OT Short Term Goals - 03/10/21 1331       OT SHORT TERM GOAL #1   Title Pt will be independent with initial HEP.--Check initial STGs (#1-4)  04/09/21    Time 4    Period Weeks    Status New      OT SHORT TERM GOAL #2   Title Pt will demo improved coordination and functional reach for ADLs as shown by improving score on box and blocks test by at least 6.    Baseline 42 blocks    Time 4    Period Weeks    Status New      OT SHORT TERM GOAL #3   Title Pt will perform bathing with min A.    Time 4    Period Weeks    Status New      OT SHORT TERM GOAL #4   Title Pt will perofrm UB dressing mod I.    Time 4    Period Weeks    Status New      OT SHORT TERM GOAL #5   Title Pt will perform LB dressing with min A.--check STGs #5-8  05/10/21    Time 8    Period Weeks    Status New      OT SHORT TERM GOAL #6   Title Pt will perform bathing with supervision.    Time 8    Period Weeks    Status New      OT SHORT TERM GOAL #7   Title Pt will peform simple home maintenane task and snack prep mod I.    Time 8    Period Weeks    Status New      OT SHORT TERM GOAL #8   Title Pt will cut food mod I using AE prn.    Time 8    Period Weeks    Status New               OT Long Term Goals - 03/10/21 1519       OT LONG TERM GOAL #1   Title Pt will be independent with updated HEP--check LTGs 06/08/21    Time 12    Period Weeks    Status New      OT LONG TERM GOAL #2   Title Pt will perform BADLs mod I with AE/DME and strategies prn.    Time 12  Period Weeks    Status New      OT LONG TERM GOAL #3   Title Pt wil be for memory/cognitive compensation strategies for ADL/IADLs prn.    Time 12    Period Weeks    Status New      OT LONG TERM GOAL #4    Title Pt will perform simple cooking task with supervision.    Time 12    Period Weeks    Status New      OT LONG TERM GOAL #5   Title Pt will perform environmental scanning/navigation with at least 90% accuracy (with head turns) without reports of dizziness/LOB.    Time 12    Period Weeks    Status New      OT LONG TERM GOAL #6   Title Pt will improve RUE coordination as shown by completing 9-hole peg test in less than 36sec without drops.    Time 12    Period Weeks    Status New                   Plan - 03/12/21 1652     Clinical Impression Statement Pt is progressing towards goals with improving RUE control and functional use. Pt was provided with information regarding walker tray for safety and I carrying items.    OT Occupational Profile and History Detailed Assessment- Review of Records and additional review of physical, cognitive, psychosocial history related to current functional performance    Occupational performance deficits (Please refer to evaluation for details): ADL's;IADL's;Leisure;Social Participation    Body Structure / Function / Physical Skills ADL;Decreased knowledge of use of DME;Strength;GMC;Dexterity;Balance;UE functional use;IADL;Endurance;ROM;Vision;Improper spinal/pelvic alignment;Coordination;Mobility;FMC    Cognitive Skills Attention;Memory;Understand    Rehab Potential Good    Clinical Decision Making Several treatment options, min-mod task modification necessary    Comorbidities Affecting Occupational Performance: May have comorbidities impacting occupational performance    Modification or Assistance to Complete Evaluation  Min-Moderate modification of tasks or assist with assess necessary to complete eval    OT Frequency 2x / week    OT Duration 12 weeks   +eval   OT Treatment/Interventions Self-care/ADL training;Moist Heat;DME and/or AE instruction;Balance training;Aquatic Therapy;Therapeutic activities;Cognitive  remediation/compensation;Therapeutic exercise;Cryotherapy;Neuromuscular education;Functional Mobility Training;Passive range of motion;Visual/perceptual remediation/compensation;Patient/family education;Manual Therapy;Energy conservation;Paraffin;Fluidtherapy    Plan Closed-chain shoulder flex, wt. bearing/scapular stabilization HEP    Consulted and Agree with Plan of Care Patient;Family member/caregiver    Family Member Consulted dtr             Patient will benefit from skilled therapeutic intervention in order to improve the following deficits and impairments:   Body Structure / Function / Physical Skills: ADL, Decreased knowledge of use of DME, Strength, GMC, Dexterity, Balance, UE functional use, IADL, Endurance, ROM, Vision, Improper spinal/pelvic alignment, Coordination, Mobility, FMC Cognitive Skills: Attention, Memory, Understand     Visit Diagnosis: Other lack of coordination  Ataxia  Unsteadiness on feet  Visuospatial deficit  Attention and concentration deficit  Other symptoms and signs involving cognitive functions following cerebral infarction    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[place]) 03/09/2021   Cerebellar stroke ([place]) 01/30/2021   Acute cerebrovascular accident (CVA) ([place]) 01/20/2021   Acute CVA (cerebrovascular accident) ([place]) 01/20/2021   GERD (gastroesophageal reflux disease)    Coronary artery disease    Depression 01/06/2020   Pneumonia 01/05/2020   History of chest pain 09/26/2018   Dyspnea on exertion  09/08/2018   Chest pain, rule out acute myocardial infarction 10/30/2017   Edema 01/25/2017   Tobacco abuse 06/10/2016   Headache 09/15/2015   OSA (obstructive sleep apnea) 07/30/2015   Nephrolithiasis 07/30/2015   Vasovagal syncope 07/30/2015   Carotid artery stenosis, asymptomatic 07/30/2015   CAD (coronary artery disease) 08/22/2013   Essential hypertension 08/22/2013   Diabetes  mellitus without complication ([place])    Dyslipidemia    Seizures ([place])    Generalized convulsive epilepsy ([place]) 03/22/2013   Memory deficit 03/22/2013    Laiklyn Pilkenton 03/12/2021, 4:53 PM  [facility] [address] [place], [place], [address] Phone: [phone]   Fax:  [phone]  Name: Annalissa Murphey MRN: [id number] Date of Birth: Jun 11, 1956

## 2021-03-12 NOTE — Telephone Encounter (Signed)
Left a message for pt letting her know that the Imdur, Carvedilol & Protonix has been sent to [facility], but the Lexapro will have to come from her PCP.

## 2021-03-12 NOTE — Patient Instructions (Signed)
  ABDOMINAL BREATHING   Shoulders down - this is a cue to relax Place your hand on your abdomen - this helps you focus on easy abdominal breath support - the best and most relaxed way to breathe Breathe in through your nose and fill your belly with air, watching your hand move outward Breathe out through your mouth and watch your belly move in. An audible "sh"  may help   Think of your belly as a balloon, when you fill with air (inhale), the balloon gets bigger. As the air goes out (exhale), the balloon deflates.  If you are having difficulty coordinating this, lay on your back with a plastic cup on your belly and repeat the above steps, watching you belly move up with inhalation and down with exhalations  Practice breathing in and out in front of a mirror, watching your belly Breathe in for a count of 5 and breathe out for a count of 5  Now as you breathe out, get a picture of relaxing in your mind Feel the constant in-out of your breathing with your belly Picture the tension in your throat and chest evaporate like steam, melting away and FEEL it do so Picture your throat opening up so wide that a grapefruit or softball could fit through your throat.

## 2021-03-12 NOTE — Therapy (Addendum)
[facility] [address] [place], [place], [address] Phone: [phone]   Fax:  [phone]  Speech Language Pathology Treatment  Patient Details  Name: Samantha Huffman MRN: [id number] Date of Birth: [birth year] Referring Provider (SLP): Lauraine Rinne, [place]   Encounter Date: 03/12/2021   End of Session - 03/12/21 1541     Visit Number 3    Number of Visits 25    Date for SLP Re-Evaluation 05/20/21    SLP Start Time 5397    SLP Stop Time  6734    SLP Time Calculation (min) 45 min    Activity Tolerance Patient tolerated treatment well             Past Medical History:  Diagnosis Date   Carotid artery stenosis    1-39% bilateral stenosis by dopplers 08/2020   Coronary artery disease    a. s/p CABG 10/2012.  cath 05/2016 with moderate LM stenosis, mild LCx and RCA stenosis and occluded LAD with patent LIMA to LAD, free radial to OM.  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   Dyslipidemia    Family history of adverse reaction to anesthesia    mother and sister ponv   Generalized convulsive epilepsy without mention of intractable epilepsy    GERD (gastroesophageal reflux disease)    History of kidney stones    Hypertension    Memory deficit    slight   Migraine    "controlled on daily RX " (06/11/2016)   Nephrolithiasis 07/30/2015   S/p lithotripsy x 3 and right and left ureter stents   OSA (obstructive sleep apnea)    cpap    PONV (postoperative nausea and vomiting)    Seizures ([place])    "controlled w/daily RX; started in 2012; dr thinks they might be from the migraines; last sz was early part of 2016" (06/11/2016)   Type II diabetes mellitus ([place])    metformin   Vasovagal syncope 07/30/2015   2011    Past Surgical History:  Procedure Laterality Date   CARDIAC CATHETERIZATION  2014; 06/11/2016   CARDIAC CATHETERIZATION N/A 06/11/2016   Procedure: Left Heart Cath and Cors/Grafts Angiography;  Surgeon:  Sherren Mocha, MD;  Location: [facility];  Service: Cardiovascular;  Laterality: N/A;   CESAREAN SECTION     CORONARY ANGIOPLASTY     CORONARY ARTERY BYPASS GRAFT  March, 2014   LIMA to LAD, left radial to LCx x 2   CYSTOSCOPY W/ URETERAL STENT PLACEMENT Left 01/16/2019   Procedure: CYSTOSCOPY WITH RETROGRADE PYELOGRAM/URETERAL LEFT STENT PLACEMENT;  Surgeon: Ardis Hughs, MD;  Location: [facility];  Service: Urology;  Laterality: Left;   CYSTOSCOPY/URETEROSCOPY/HOLMIUM LASER/STENT PLACEMENT Left 01/24/2019   Procedure: CYSTOSCOPY, LEFT URETEROSCOPY, HOLMIUM LASER, STONE REMOVAL AND STENT EXCHANGE;  Surgeon: Ardis Hughs, MD;  Location: [facility];  Service: Urology;  Laterality: Left;   LITHOTRIPSY  X 3   RIGHT/LEFT HEART CATH AND CORONARY/GRAFT ANGIOGRAPHY N/A 09/08/2018   Procedure: RIGHT/LEFT HEART CATH AND CORONARY/GRAFT ANGIOGRAPHY;  Surgeon: Martinique, Peter M, MD;  Location: [facility];  Service: Cardiovascular;  Laterality: N/A;   TOTAL ABDOMINAL HYSTERECTOMY     ovaries took before hysterectomy   TUBAL LIGATION     URETERAL STENT PLACEMENT      There were no vitals filed for this visit.   Subjective Assessment - 03/12/21 1540     Subjective "I brought it back" re: HEP    Currently in Pain? No/denies  ADULT SLP TREATMENT - 03/12/21 1534       General Information   Behavior/Cognition Alert;Pleasant mood;Cooperative      Treatment Provided   Treatment provided Cognitive-Linquistic      Cognitive-Linquistic Treatment   Treatment focused on Aphasia;Dysarthria;Cognition    Skilled Treatment Pt returned with folder and HEP which was reportedly completed aloud with daughter. No written answers provided. Occasional prompting and cues reportedly provided by daughter to aid naming. SLP assessed divergent naming of 5 items in category with usual additional processing time and occasional questioning cues required. Pt utilized description  strategy x1 when anomia occurred. SLP completed CLQT this session, which revealed severe executive functioning, moderate memory and visuospatial skills, and mild language and attention. Usual high pitch, strained vocal quality noted, with intermittent dysfluency exhibited in conversation. SLP initated assessment of oral motor skills, in which pt became SOB during AMRs. SLP notified daughter of SOB occurance, which is reportedly happening during longer utterances. SLP educated patient and her daughter on abdominal breathing to reduce SOB and maximize breath support, which was effective.      Assessment / Recommendations / Plan   Plan Continue with current plan of care      Progression Toward Goals   Progression toward goals Progressing toward goals              SLP Education - 03/12/21 1618     Education Details CLQT results, abdominal breathing    Person(s) Educated Patient;Child(ren)    Methods Explanation;Demonstration;Handout    Comprehension Verbalized understanding;Returned demonstration;Need further instruction              SLP Short Term Goals - 03/12/21 1542       SLP SHORT TERM GOAL #1   Title pt will engage in cognitive linguistic and speech strength/coordination testing and goals added as necessary    Status Achieved    Target Date 03/05/21      SLP SHORT TERM GOAL #2   Title using compensations, Samantha Huffman will remind family when she needs to take meds (pt is now dependent upon family) 80% of opportunities between 3 sessions    Time 4    Period Weeks    Status On-going    Target Date 03/20/21      SLP SHORT TERM GOAL #3   Title pt will name 10 items in a simple category with rare verbal cues x3 sessions    Time 4    Period Weeks    Status On-going    Target Date 03/20/21      SLP SHORT TERM GOAL #4   Title pt will independently use anomia compensations in 5 minutes simple conversation x3 sessions    Baseline 03-12-21    Time 4    Period Weeks    Status  On-going    Target Date 03/20/21      SLP SHORT TERM GOAL #5   Title pt will exhibit 100% intelligibility in 5 minutes simple conversation using speech compensations, x3 sessions    Time 4    Period Weeks    Status On-going    Target Date 03/20/21      SLP SHORT TERM GOAL #6   Title Samantha Huffman will complete a speech HEP with rare min A x2 sessions    Time 4    Period Weeks    Status On-going    Target Date 03/20/21      SLP SHORT TERM GOAL #7   Title Pt will verbalize and demonstrate  3 attention/memory strategies to aid functioning home with min A over 2 sessions    Time 4    Period Weeks    Status New              SLP Long Term Goals - 03/12/21 1548       SLP LONG TERM GOAL #1   Title pt will complete speech HEP independently x3 sessions    Time 8    Period Weeks    Status On-going      SLP LONG TERM GOAL #2   Title in 10 minutes simple conversation pt will exhibit functional fluid speech using compensations in 2 sessions    Time 8    Period Weeks    Status On-going      SLP LONG TERM GOAL #3   Title in 10 minutes mod complex conversation pt will exhibit functional fluid speech using compensations in 2 sessions    Time 12    Period Weeks    Status On-going      SLP LONG TERM GOAL #4   Title pt will complete VNeST and SFA tasks with rare min A in 3 sessions    Time 8    Period Weeks    Status On-going      SLP LONG TERM GOAL #5   Title Samantha Huffman will exhibit functional word-finding using compensations in 10 mintues mod complex conversation in 3 sessions    Time 12    Period Weeks    Status On-going      SLP LONG TERM GOAL #6   Title Samantha Huffman will independently take meds for 2 weeks as reported by Samantha Huffman or family    Time 12    Period Weeks    Status On-going      SLP LONG TERM GOAL #7   Title Pt will verbalize and demonstrate 3 attention/memory strategies to aid functioning at work and home with rare min A over 2 sessions    Time [place] - 03/12/21 1548     Clinical Impression Statement Samantha Huffman presents today with anomia as diagnosed by [facility], and moderate cognitive communication deficits. CLQT completed this session, which revealed overall moderate cognitive deficit. Oral motor assessment initiated this session. Pt impacted by SOB during speech assessment. Occasional s/sx of dysluent speech and anomia exhibited in conversation this session. Pt used to manage her own meds and live independently and now lives with daughter. She would benefit from skilled ST targeting her deficit areas.    Speech Therapy Frequency 2x / week    Duration 12 weeks    Treatment/Interventions Language facilitation;Cueing hierarchy;SLP instruction and feedback;Cognitive reorganization;Compensatory strategies;Internal/external aids;Patient/family education;Functional tasks;Environmental controls;Oral motor exercises    Potential to Achieve Goals Good    Consulted and Agree with Plan of Care Patient             Patient will benefit from skilled therapeutic intervention in order to improve the following deficits and impairments:   Aphasia  Dysarthria and anarthria  Cognitive communication deficit    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[place]) 03/09/2021   Cerebellar stroke ([place]) 01/30/2021   Acute cerebrovascular accident (CVA) ([place]) 01/20/2021   Acute CVA (cerebrovascular accident) ([place]) 01/20/2021   GERD (gastroesophageal reflux disease)    Coronary artery disease  Depression 01/06/2020   Pneumonia 01/05/2020   History of chest pain 09/26/2018   Dyspnea on exertion 09/08/2018   Chest pain, rule out acute myocardial infarction 10/30/2017   Edema 01/25/2017   Tobacco abuse 06/10/2016   Headache 09/15/2015   OSA (obstructive sleep apnea) 07/30/2015   Nephrolithiasis 07/30/2015   Vasovagal syncope 07/30/2015   Carotid artery stenosis,  asymptomatic 07/30/2015   CAD (coronary artery disease) 08/22/2013   Essential hypertension 08/22/2013   Diabetes mellitus without complication ([place])    Dyslipidemia    Seizures ([place])    Generalized convulsive epilepsy ([place]) 03/22/2013   Memory deficit 03/22/2013    Alinda Deem, MA CCC-SLP 03/12/2021, 4:27 PM  [facility] [address] [place], [place], [address] Phone: [phone]   Fax:  [phone]   Name: Samantha Huffman MRN: [id number] Date of Birth: [birth year]

## 2021-03-13 NOTE — Therapy (Signed)
[facility] [address] [place], [place], [address] Phone: [phone]   Fax:  [phone]  Physical Therapy Treatment  Patient Details  Name: Samantha Huffman MRN: [id number] Date of Birth: [birth year] Referring Provider (PT): Lauraine Rinne, PA-C   Encounter Date: 03/12/2021   PT End of Session - 03/13/21 1535     Visit Number 3    Number of Visits 25    Date for PT Re-Evaluation 05/15/21    Authorization Type [employer]    Authorization Time Period 02-19-21 - 05-21-21    Progress Note Due on Visit 10    PT Start Time 1406    PT Stop Time 1446    PT Time Calculation (min) 40 min    Equipment Utilized During Treatment Gait belt    Activity Tolerance Patient tolerated treatment well    Behavior During Therapy [facility] for tasks assessed/performed             Past Medical History:  Diagnosis Date   Carotid artery stenosis    1-39% bilateral stenosis by dopplers 08/2020   Coronary artery disease    a. s/p CABG 10/2012.  cath 05/2016 with moderate LM stenosis, mild LCx and RCA stenosis and occluded LAD with patent LIMA to LAD, free radial to OM.  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   Dyslipidemia    Family history of adverse reaction to anesthesia    mother and sister ponv   Generalized convulsive epilepsy without mention of intractable epilepsy    GERD (gastroesophageal reflux disease)    History of kidney stones    Hypertension    Memory deficit    slight   Migraine    "controlled on daily RX " (06/11/2016)   Nephrolithiasis 07/30/2015   S/p lithotripsy x 3 and right and left ureter stents   OSA (obstructive sleep apnea)    cpap    PONV (postoperative nausea and vomiting)    Seizures ([place])    "controlled w/daily RX; started in 2012; dr thinks they might be from the migraines; last sz was early part of 2016" (06/11/2016)   Type II diabetes mellitus ([place])    metformin   Vasovagal syncope 07/30/2015    2011    Past Surgical History:  Procedure Laterality Date   CARDIAC CATHETERIZATION  2014; 06/11/2016   CARDIAC CATHETERIZATION N/A 06/11/2016   Procedure: Left Heart Cath and Cors/Grafts Angiography;  Surgeon: Sherren Mocha, MD;  Location: [facility];  Service: Cardiovascular;  Laterality: N/A;   CESAREAN SECTION     CORONARY ANGIOPLASTY     CORONARY ARTERY BYPASS GRAFT  March, 2014   LIMA to LAD, left radial to LCx x 2   CYSTOSCOPY W/ URETERAL STENT PLACEMENT Left 01/16/2019   Procedure: CYSTOSCOPY WITH RETROGRADE PYELOGRAM/URETERAL LEFT STENT PLACEMENT;  Surgeon: Ardis Hughs, MD;  [facility];  Service: Urology;  Laterality: Left;   CYSTOSCOPY/URETEROSCOPY/HOLMIUM LASER/STENT PLACEMENT Left 01/24/2019   Procedure: CYSTOSCOPY, LEFT URETEROSCOPY, HOLMIUM LASER, STONE REMOVAL AND STENT EXCHANGE;  Surgeon: Ardis Hughs, MD;  [facility];  Service: Urology;  Laterality: Left;   LITHOTRIPSY  X 3   RIGHT/LEFT HEART CATH AND CORONARY/GRAFT ANGIOGRAPHY N/A 09/08/2018   Procedure: RIGHT/LEFT HEART CATH AND CORONARY/GRAFT ANGIOGRAPHY;  Surgeon: Martinique, Peter M, MD;  Location: [facility];  Service: Cardiovascular;  Laterality: N/A;   TOTAL ABDOMINAL HYSTERECTOMY     ovaries took before hysterectomy   TUBAL LIGATION  URETERAL STENT PLACEMENT      There were no vitals filed for this visit.   Subjective Assessment - 03/12/21 1412     Subjective Pt states she has been doing the exercises - has been trying to lift her left leg higher    Patient is accompained by: Family member   Kia   Pertinent History Carotid artery stenosis, HTN, epilepsy with seizure disorder - last seizure was in early 2016:  migraines, DM, memory deficit, CAD,    Diagnostic tests CT of head and neck:  MRA    Patient Stated Goals Improve balance and walking    Currently in Pain? No/denies                               [facility] Adult PT Treatment/Exercise - 03/13/21  0001       Transfers   Transfers Sit to Stand;Stand to Sit    Sit to Stand 4: Min guard    Number of Reps Other reps (comment)   5 reps; no UE support from mat table - feet on floor     Ambulation/Gait   Ambulation/Gait Yes    Ambulation/Gait Assistance 5: Supervision    Ambulation/Gait Assistance Details from lobby to gym    Ambulation Distance (Feet) 100 Feet   230' without device with CGA   Assistive device Rolling walker;None    Gait Pattern Ataxic    Ambulation Surface Level;Indoor      Neuro Re-ed    Neuro Re-ed Details  Pt performed quadruped  exercise - lifting opposite UE/LE 3 reps each side with 5 sec hold with min assist for stabilization due to decreased core stability                 Balance Exercises - 03/13/21 0001       Balance Exercises: Standing   SLS Eyes open;Solid surface;2 reps;10 secs   with UE support   Sidestepping 2 reps   10' along // bar with CGA   Step Over Hurdles / Cones stepping over/ back and then laterally over black balance beam with UE support prn on // bar - 10 reps each leg each direction    Marching Solid surface;Static;10 reps   no UE support used   Other Standing Exercises tap ups to 6" step inside // bars 10 reps each foot with UE support on // bar prn; stepping up with one leg and lifting other leg to 90 degrees hip and knee flexion and then placing lifted LE back down on floor - for strengthening and improved coordination    Other Standing Exercises Comments Pt performed cone taps to 3 cones with min assist for balance recovery - 5 reps each leg            Pt performed balance exercises for HEP - forward, back and side kicks alternating 10 reps each leg with UE support prn Marching in place 10 reps each leg; sidestepping along // bars 2 reps with SBA  SLS 10 secs on each leg with UE support; 2 reps each  Pt stood with feet together without UE support with SBA 30 sec hold x 1 rep (this exercise was not given for HEP)   PT  Education - 03/13/21 1535     Education Details pt instructed in HEP for balance exs - see pt instructions    Person(s) Educated Patient    Methods Explanation;Demonstration;Handout  Comprehension Verbalized understanding;Returned demonstration              PT Short Term Goals - 03/13/21 1536       PT SHORT TERM GOAL #1   Title Improve Berg score from 34/56 to >/= 39/56 for reduced fall risk.    Baseline 34/56 - 02-19-21    Time 4    Period Weeks    Status New    Target Date 03/20/21      PT SHORT TERM GOAL #2   Title Improve TUG score from 17.32 secs to </= 14.5 secs without use of device to increase safety with ambulation.    Baseline 17.32 secs    Time 4    Period Weeks    Status New    Target Date 03/20/21      PT SHORT TERM GOAL #3   Title Increase gait velocity from 2.25 ft/sec without device to >/= 2.6 ft/sec with CGA for incr. gait efficiency.    Baseline 2.25 ft/sec = 14.57 secs    Time 4    Period Weeks    Status New    Target Date 03/20/21      PT SHORT TERM GOAL #4   Title Assess DGI and establish goal as appropriate.    Time 4    Period Weeks    Status New    Target Date 03/20/21      PT SHORT TERM GOAL #5   Title Pt wil amb. 350' with CGA on flat, even surface without LOB.    Time 4    Period Weeks    Status New    Target Date 03/20/21      PT SHORT TERM GOAL #6   Title Independent in HEP for balance and strengthening exercises.    Time 4    Period Weeks    Status New    Target Date 03/20/21               PT Long Term Goals - 03/13/21 1544       PT LONG TERM GOAL #1   Title Improve Berg score from 34/56 to >/= 50/56 to reduce fall risk.    Baseline 34/56 on 02-19-21    Time 12    Period Weeks    Status New      PT LONG TERM GOAL #2   Title Pt will ambulate 1000' modified independently without device on even and uneven surfaces.    Time 12    Period Weeks    Status New      PT LONG TERM GOAL #3   Title Improve TUG score  from 17.32 secs to </= 13 secs to reduce fall risk.    Baseline 17.32 secs on 02-19-21 with CGA    Time 12    Period Weeks    Status New      PT LONG TERM GOAL #4   Title Improve DGI by at least 10 points to increase safety with gait.    Time 12    Period Weeks    Status New      PT LONG TERM GOAL #5   Title Independent in updated HEP as appropriate.    Time 12    Period Weeks    Status New      PT LONG TERM GOAL #6   Title Increase LE FOTO score by at least 10 points to demo improvement in functional status.    Baseline 59/100 on 02-19-21  Time 12    Period Weeks    Status New                   Plan - 03/13/21 1537     Clinical Impression Statement Pt brought her RW to PT session for first time today; pt able to amb. much quicker and with longer step length with RW, and with increased safety with amb. Pt's RLE remains weaker and slightly more ataxic than her LLE.  Pt improved with quadruped exercise compared to performance on 03-10-21, with more stability demonstrated with this exercise in today's session. Cont with POC.    Personal Factors and Comorbidities Comorbidity 2;Past/Current Experience;Transportation    Comorbidities migraines, epilepsy with seizure disorder, DM, CAD, s/p cataract sx on bil. eyes, memory deficit    Examination-Activity Limitations Bathing;Stand;Transfers;Carry;Squat;Stairs;Locomotion Level;Hygiene/Grooming;Bend    Examination-Participation Restrictions Cleaning;Community Activity;Driving;Laundry;Shop;Meal Prep    Stability/Clinical Decision Making Evolving/Moderate complexity    Rehab Potential Good    PT Frequency 2x / week    PT Duration 12 weeks    PT Treatment/Interventions ADLs/Self Care Home Management;Aquatic Therapy;DME Instruction;Gait training;Stair training;Therapeutic activities;Therapeutic exercise;Balance training;Neuromuscular re-education;Patient/family education    PT Next Visit Plan Check balance HEP issued on 03-12-21 for  any ?'s or problems:  STG's due 7-29 even though pt has only been seen 2x since eval.  Cont gait training without RW, balance and coordination exercises; RLE strengthening    PT Home Exercise Plan balance HEP issued on 03-12-21    Consulted and Agree with Plan of Care Patient             Patient will benefit from skilled therapeutic intervention in order to improve the following deficits and impairments:  Abnormal gait, Decreased activity tolerance, Decreased endurance, Decreased balance, Decreased coordination, Decreased strength, Impaired UE functional use  Visit Diagnosis: Other abnormalities of gait and mobility  Unsteadiness on feet  Other lack of coordination     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/30/2021   Acute cerebrovascular accident (CVA) ([place]) 01/20/2021   Acute CVA (cerebrovascular accident) ([place]) 01/20/2021   GERD (gastroesophageal reflux disease)    Coronary artery disease    Depression 01/06/2020   Pneumonia 01/05/2020   History of chest pain 09/26/2018   Dyspnea on exertion 09/08/2018   Chest pain, rule out acute myocardial infarction 10/30/2017   Edema 01/25/2017   Tobacco abuse 06/10/2016   Headache 09/15/2015   OSA (obstructive sleep apnea) 07/30/2015   Nephrolithiasis 07/30/2015   Vasovagal syncope 07/30/2015   Carotid artery stenosis, asymptomatic 07/30/2015   CAD (coronary artery disease) 08/22/2013   Essential hypertension 08/22/2013   Diabetes mellitus without complication ([place])    Dyslipidemia    Seizures ([place])    Generalized convulsive epilepsy ([place]) 03/22/2013   Memory deficit 03/22/2013    Alda Lea, PT 03/13/2021, 3:46 PM  [facility] [address] [place], [place], [address] Phone: [phone]   Fax:  [phone]  Name: Sarahrose Beliles MRN: [id number] Date of  Birth: 21-Jul-1956

## 2021-03-17 ENCOUNTER — Ambulatory Visit (INDEPENDENT_AMBULATORY_CARE_PROVIDER_SITE_OTHER): Payer: Medicare HMO

## 2021-03-17 DIAGNOSIS — I639 Cerebral infarction, unspecified: Secondary | ICD-10-CM | POA: Diagnosis not present

## 2021-03-17 DIAGNOSIS — I4891 Unspecified atrial fibrillation: Secondary | ICD-10-CM

## 2021-03-17 DIAGNOSIS — R9431 Abnormal electrocardiogram [ECG] [EKG]: Secondary | ICD-10-CM

## 2021-03-17 DIAGNOSIS — I6312 Cerebral infarction due to embolism of basilar artery: Secondary | ICD-10-CM

## 2021-03-18 ENCOUNTER — Encounter: Payer: Self-pay | Admitting: Speech Pathology

## 2021-03-18 ENCOUNTER — Ambulatory Visit: Payer: Medicare HMO | Admitting: Speech Pathology

## 2021-03-18 ENCOUNTER — Encounter: Payer: Self-pay | Admitting: Physical Therapy

## 2021-03-18 ENCOUNTER — Ambulatory Visit: Payer: Medicare HMO | Admitting: Occupational Therapy

## 2021-03-18 ENCOUNTER — Other Ambulatory Visit: Payer: Self-pay

## 2021-03-18 ENCOUNTER — Ambulatory Visit: Payer: Medicare HMO | Admitting: Physical Therapy

## 2021-03-18 DIAGNOSIS — R2681 Unsteadiness on feet: Secondary | ICD-10-CM

## 2021-03-18 DIAGNOSIS — R471 Dysarthria and anarthria: Secondary | ICD-10-CM

## 2021-03-18 DIAGNOSIS — R278 Other lack of coordination: Secondary | ICD-10-CM

## 2021-03-18 DIAGNOSIS — R41842 Visuospatial deficit: Secondary | ICD-10-CM

## 2021-03-18 DIAGNOSIS — R41840 Attention and concentration deficit: Secondary | ICD-10-CM

## 2021-03-18 DIAGNOSIS — R2689 Other abnormalities of gait and mobility: Secondary | ICD-10-CM | POA: Diagnosis not present

## 2021-03-18 DIAGNOSIS — I69318 Other symptoms and signs involving cognitive functions following cerebral infarction: Secondary | ICD-10-CM

## 2021-03-18 DIAGNOSIS — R270 Ataxia, unspecified: Secondary | ICD-10-CM

## 2021-03-18 DIAGNOSIS — R4701 Aphasia: Secondary | ICD-10-CM

## 2021-03-18 DIAGNOSIS — R41841 Cognitive communication deficit: Secondary | ICD-10-CM

## 2021-03-18 NOTE — Therapy (Signed)
[facility] [address] [place], [place], [address] Phone: [phone]   Fax:  [phone]  Speech Language Pathology Treatment  Patient Details  Name: Samantha Huffman MRN: [id number] Date of Birth: [birth year] Referring Provider (SLP): Lauraine Rinne, [place]   Encounter Date: 03/18/2021   End of Session - 03/18/21 1222     Visit Number 4    Number of Visits 25    Date for SLP Re-Evaluation 05/20/21    SLP Start Time 0930    SLP Stop Time  1013    SLP Time Calculation (min) 43 min    Activity Tolerance Patient tolerated treatment well             Past Medical History:  Diagnosis Date   Carotid artery stenosis    1-39% bilateral stenosis by dopplers 08/2020   Coronary artery disease    a. s/p CABG 10/2012.  cath 05/2016 with moderate LM stenosis, mild LCx and RCA stenosis and occluded LAD with patent LIMA to LAD, free radial to OM.  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   Dyslipidemia    Family history of adverse reaction to anesthesia    mother and sister ponv   Generalized convulsive epilepsy without mention of intractable epilepsy    GERD (gastroesophageal reflux disease)    History of kidney stones    Hypertension    Memory deficit    slight   Migraine    "controlled on daily RX " (06/11/2016)   Nephrolithiasis 07/30/2015   S/p lithotripsy x 3 and right and left ureter stents   OSA (obstructive sleep apnea)    cpap    PONV (postoperative nausea and vomiting)    Seizures ([place])    "controlled w/daily RX; started in 2012; dr thinks they might be from the migraines; last sz was early part of 2016" (06/11/2016)   Type II diabetes mellitus ([place])    metformin   Vasovagal syncope 07/30/2015   2011    Past Surgical History:  Procedure Laterality Date   CARDIAC CATHETERIZATION  2014; 06/11/2016   CARDIAC CATHETERIZATION N/A 06/11/2016   Procedure: Left Heart Cath and Cors/Grafts Angiography;  Surgeon:  Sherren Mocha, MD;  Location: [facility];  Service: Cardiovascular;  Laterality: N/A;   CESAREAN SECTION     CORONARY ANGIOPLASTY     CORONARY ARTERY BYPASS GRAFT  March, 2014   LIMA to LAD, left radial to LCx x 2   CYSTOSCOPY W/ URETERAL STENT PLACEMENT Left 01/16/2019   Procedure: CYSTOSCOPY WITH RETROGRADE PYELOGRAM/URETERAL LEFT STENT PLACEMENT;  Surgeon: Ardis Hughs, MD;  [facility];  Service: Urology;  Laterality: Left;   CYSTOSCOPY/URETEROSCOPY/HOLMIUM LASER/STENT PLACEMENT Left 01/24/2019   Procedure: CYSTOSCOPY, LEFT URETEROSCOPY, HOLMIUM LASER, STONE REMOVAL AND STENT EXCHANGE;  Surgeon: Ardis Hughs, MD;  [facility];  Service: Urology;  Laterality: Left;   LITHOTRIPSY  X 3   RIGHT/LEFT HEART CATH AND CORONARY/GRAFT ANGIOGRAPHY N/A 09/08/2018   Procedure: RIGHT/LEFT HEART CATH AND CORONARY/GRAFT ANGIOGRAPHY;  Surgeon: Martinique, Peter M, MD;  Location: [facility];  Service: Cardiovascular;  Laterality: N/A;   TOTAL ABDOMINAL HYSTERECTOMY     ovaries took before hysterectomy   TUBAL LIGATION     URETERAL STENT PLACEMENT      There were no vitals filed for this visit.   Subjective Assessment - 03/18/21 0935     Subjective "I don't want to take all these pills"    Currently in Pain?  No/denies                   ADULT SLP TREATMENT - 03/18/21 0937       General Information   Behavior/Cognition Alert;Pleasant mood;Cooperative      Treatment Provided   Treatment provided Cognitive-Linquistic      Cognitive-Linquistic Treatment   Treatment focused on Aphasia;Dysarthria;Cognition;Patient/family/caregiver education    Skilled Treatment Daughter is organizing pills in organizer however Samantha Huffman is remembering to take meds once they are in the organizer. Samantha Huffman reports making her bed and some light bathroom cleaning. However, she also reports atempting to vacuum and drive her car. Family has taken her keys. Today, Samantha Huffman why she  shouldn't drive and vacuum, and endorses in the moment she thinks she is safe. She does verbalize and demonstrate emergetn safety awarenss with hot liquids not carrying hot coffee. With safety cards, Samantha Huffman ID'd unsafe situations and solutions, however with iron safety card, Samantha Huffman endorsed that she did try to iron yesterday and her friend stopped her. She required mod verbal and questioning cues to verbalize safety issues with her ironing. She endorses impulsive decision making. She required consistent cues to ID 3 semantic paraphasias in converation. Pt entered room with elevated pitch and some SOB, however as safety description task, pitch lowered with no SOB, as I noted this, her pitch elevated temporarily, however returned to WNL as task resumed. Mild hoarseness throughout.      Assessment / Recommendations / Plan   Plan Continue with current plan of care      Progression Toward Goals   Progression toward goals Progressing toward goals              SLP Education - 03/18/21 1215     Education Details impulsiveness, safety awareness, ways family can A with safety awareness in the moment    Person(s) Educated Patient    Methods Explanation;Demonstration;Verbal cues;Handout    Comprehension Verbalized understanding;Verbal cues required;Need further instruction;Returned demonstration              SLP Short Term Goals - 03/18/21 1220       SLP SHORT TERM GOAL #1   Title pt will engage in cognitive linguistic and speech strength/coordination testing and goals added as necessary    Status Achieved    Target Date 03/05/21      SLP SHORT TERM GOAL #2   Title using compensations, Samantha Huffman will remind family when she needs to take meds (pt is now dependent upon family) 80% of opportunities between 3 sessions    Time 3    Period Weeks    Status On-going    Target Date 03/20/21      SLP SHORT TERM GOAL #3   Title pt will name 10 items in a simple category with rare verbal cues x3  sessions    Time 3    Period Weeks    Status On-going    Target Date 03/20/21      SLP SHORT TERM GOAL #4   Title pt will independently use anomia compensations in 5 minutes simple conversation x3 sessions    Baseline 03-12-21    Time 3    Period Weeks    Status On-going    Target Date 03/20/21      SLP SHORT TERM GOAL #5   Title pt will exhibit 100% intelligibility in 5 minutes simple conversation using speech compensations, x3 sessions    Time 3    Period Weeks  Status On-going    Target Date 03/20/21      SLP SHORT TERM GOAL #6   Title Samantha Huffman will complete a speech HEP with rare min A x2 sessions    Time 4    Period Weeks    Status On-going    Target Date 03/20/21      SLP SHORT TERM GOAL #7   Title Pt will verbalize and demonstrate 3 attention/memory strategies to aid functioning home with min A over 2 sessions    Time 3    Period Weeks    Status New              SLP Long Term Goals - 03/18/21 1221       SLP LONG TERM GOAL #1   Title pt will complete speech HEP independently x3 sessions    Time 7    Period Weeks    Status On-going      SLP LONG TERM GOAL #2   Title in 10 minutes simple conversation pt will exhibit functional fluid speech using compensations in 2 sessions    Time 7    Period Weeks    Status On-going      SLP LONG TERM GOAL #3   Title in 10 minutes mod complex conversation pt will exhibit functional fluid speech using compensations in 2 sessions    Time 11    Period Weeks    Status On-going      SLP LONG TERM GOAL #4   Title pt will complete VNeST and SFA tasks with rare min A in 3 sessions    Time 7    Period Weeks    Status On-going      SLP LONG TERM GOAL #5   Title Samantha Huffman will exhibit functional word-finding using compensations in 10 mintues mod complex conversation in 3 sessions    Time 11    Period Weeks    Status On-going      SLP LONG TERM GOAL #6   Title Samantha Huffman will independently take meds for 2 weeks as reported by  Samantha Huffman or family    Time 12    Period Weeks    Status On-going      SLP LONG TERM GOAL #7   Title Pt will verbalize and demonstrate 3 attention/memory strategies to aid functioning at work and home with rare min A over 2 sessions    Time 11    Period Weeks    Status New              Plan - 03/18/21 1216     Clinical Impression Statement 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 Samantha Huffman has made several unsafe decisions at home due to impulsiveness and poor awareness. Targeted safety awareness today with min to mod A (See skilled intervention). Observed to patient when her pitch was normal vs elevated - as session progressed, WNL pitch achieved with rare min A. At this time, she is dependent on family and friends, however they also require training for activities that Samantha Huffman can safely do and to help her progress to more independence. Continue skilled ST to maximize cognition, languae and intelligibility for safety, independence and to reduce caregiver burden.    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   Cerebellar stroke ([place]) 01/30/2021   Acute cerebrovascular accident (CVA) ([place]) 01/20/2021   Acute CVA (cerebrovascular accident) ([place]) 01/20/2021   GERD (gastroesophageal reflux disease)    Coronary artery disease    Depression 01/06/2020   Pneumonia 01/05/2020   History of  chest pain 09/26/2018   Dyspnea on exertion 09/08/2018   Chest pain, rule out acute myocardial infarction 10/30/2017   Edema 01/25/2017   Tobacco abuse 06/10/2016   Headache 09/15/2015   OSA (obstructive sleep apnea) 07/30/2015   Nephrolithiasis 07/30/2015   Vasovagal syncope 07/30/2015   Carotid artery stenosis, asymptomatic 07/30/2015   CAD (coronary artery disease) 08/22/2013   Essential hypertension 08/22/2013   Diabetes mellitus without complication ([place])    Dyslipidemia    Seizures ([place])    Generalized convulsive epilepsy ([place]) 03/22/2013   Memory deficit 03/22/2013    Samantha Huffman, Annye Rusk MS, [place] 03/18/2021, 12:23 PM  [facility] [address] [place], [place], [address] Phone: [phone]   Fax:  [phone]   Name: Samantha Huffman MRN: [id number] Date of Birth: 12-Feb-1956

## 2021-03-18 NOTE — Therapy (Signed)
[facility] [address] [place], [place], [address] Phone: [phone]   Fax:  [phone]  Physical Therapy Treatment  Patient Details  Name: Samantha Huffman MRN: [id number] Date of Birth: 07/24/1956 Referring Provider (PT): Lauraine Rinne, PA-C   Encounter Date: 03/18/2021   PT End of Session - 03/18/21 0849     Visit Number 4    Number of Visits 25    Date for PT Re-Evaluation 05/15/21    Authorization Type [employer]    Authorization Time Period 02-19-21 - 05-21-21    Progress Note Due on Visit 10    PT Start Time 0848    PT Stop Time 0930    PT Time Calculation (min) 42 min    Equipment Utilized During Treatment Gait belt    Activity Tolerance Patient tolerated treatment well    Behavior During Therapy [facility] for tasks assessed/performed             Past Medical History:  Diagnosis Date   Carotid artery stenosis    1-39% bilateral stenosis by dopplers 08/2020   Coronary artery disease    a. s/p CABG 10/2012.  cath 05/2016 with moderate LM stenosis, mild LCx and RCA stenosis and occluded LAD with patent LIMA to LAD, free radial to OM.  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   Dyslipidemia    Family history of adverse reaction to anesthesia    mother and sister ponv   Generalized convulsive epilepsy without mention of intractable epilepsy    GERD (gastroesophageal reflux disease)    History of kidney stones    Hypertension    Memory deficit    slight   Migraine    "controlled on daily RX " (06/11/2016)   Nephrolithiasis 07/30/2015   S/p lithotripsy x 3 and right and left ureter stents   OSA (obstructive sleep apnea)    cpap    PONV (postoperative nausea and vomiting)    Seizures ([place])    "controlled w/daily RX; started in 2012; dr thinks they might be from the migraines; last sz was early part of 2016" (06/11/2016)   Type II diabetes mellitus ([place])    metformin   Vasovagal syncope 07/30/2015    2011    Past Surgical History:  Procedure Laterality Date   CARDIAC CATHETERIZATION  2014; 06/11/2016   CARDIAC CATHETERIZATION N/A 06/11/2016   Procedure: Left Heart Cath and Cors/Grafts Angiography;  Surgeon: Sherren Mocha, MD;  Location: [facility];  Service: Cardiovascular;  Laterality: N/A;   CESAREAN SECTION     CORONARY ANGIOPLASTY     CORONARY ARTERY BYPASS GRAFT  March, 2014   LIMA to LAD, left radial to LCx x 2   CYSTOSCOPY W/ URETERAL STENT PLACEMENT Left 01/16/2019   Procedure: CYSTOSCOPY WITH RETROGRADE PYELOGRAM/URETERAL LEFT STENT PLACEMENT;  Surgeon: Ardis Hughs, MD;  Location: [facility];  Service: Urology;  Laterality: Left;   CYSTOSCOPY/URETEROSCOPY/HOLMIUM LASER/STENT PLACEMENT Left 01/24/2019   Procedure: CYSTOSCOPY, LEFT URETEROSCOPY, HOLMIUM LASER, STONE REMOVAL AND STENT EXCHANGE;  Surgeon: Ardis Hughs, MD;  Location: [facility];  Service: Urology;  Laterality: Left;   LITHOTRIPSY  X 3   RIGHT/LEFT HEART CATH AND CORONARY/GRAFT ANGIOGRAPHY N/A 09/08/2018   Procedure: RIGHT/LEFT HEART CATH AND CORONARY/GRAFT ANGIOGRAPHY;  Surgeon: Martinique, Peter M, MD;  Location: [facility];  Service: Cardiovascular;  Laterality: N/A;   TOTAL ABDOMINAL HYSTERECTOMY     ovaries took before hysterectomy   TUBAL LIGATION  URETERAL STENT PLACEMENT      There were no vitals filed for this visit.   Subjective Assessment - 03/18/21 0848     Subjective No new complaints. No falls or pain to report. Using RW to clinic today.    Patient is accompained by: Family member   Friend from [place] in car   Pertinent History Carotid artery stenosis, HTN, epilepsy with seizure disorder - last seizure was in early 2016:  migraines, DM, memory deficit, CAD,    Diagnostic tests CT of head and neck:  MRA    Patient Stated Goals Improve balance and walking    Currently in Pain? No/denies                [facility] PT Assessment - 03/18/21 0855       Berg Balance Test   Sit to  Stand Able to stand without using hands and stabilize independently    Standing Unsupported Able to stand safely 2 minutes    Sitting with Back Unsupported but Feet Supported on Floor or Stool Able to sit safely and securely 2 minutes    Stand to Sit Controls descent by using hands    Transfers Able to transfer safely, minor use of hands    Standing Unsupported with Eyes Closed Able to stand 10 seconds safely    Standing Unsupported with Feet Together Able to place feet together independently and stand 1 minute safely   right toes with slight toe out, heels together   From Standing, Reach Forward with Outstretched Arm Can reach confidently >25 cm (10")    From Standing Position, Pick up Object from Floor Able to pick up shoe safely and easily    From Standing Position, Turn to Look Behind Over each Shoulder Looks behind one side only/other side shows less weight shift   right>left   Turn 360 Degrees Able to turn 360 degrees safely in 4 seconds or less   right  3.81 sec's, left 3.16 sec's   Standing Unsupported, Alternately Place Feet on Step/Stool Able to stand independently and safely and complete 8 steps in 20 seconds   13.04 sec's with supervision for safety only, no LOB noted   Standing Unsupported, One Foot in Front Able to plae foot ahead of the other independently and hold 30 seconds    Standing on One Leg Tries to lift leg/unable to hold 3 seconds but remains standing independently   bil LE's for ~2-3 sec's   Total Score 50    Berg comment: 50/56 moderate risk      Timed Up and Go Test   TUG Normal TUG    Normal TUG (seconds) 14.41   wtih RW   TUG Comments supervision with RW                   [facility] Adult PT Treatment/Exercise - 03/18/21 0855       Transfers   Transfers Sit to Stand;Stand to Sit    Sit to Stand 4: Min guard;With upper extremity assist;From bed;From chair/3-in-1    Stand to Sit 4: Min guard;With upper extremity assist;To bed;To chair/3-in-1       Ambulation/Gait   Ambulation/Gait Yes    Ambulation/Gait Assistance 5: Supervision;4: Min guard    Ambulation/Gait Assistance Details one episode of left foot kicking walker with turning toward left needing min guard assist for safety toward end of gait rep, otherwise supervision with RW.    Ambulation Distance (Feet) 430 Feet   x1, 115  x1,  plus around clinic with session   Assistive device Rolling walker    Gait Pattern Step-through pattern;Ataxic    Ambulation Surface Level;Indoor    Gait velocity 13.59 sec's= 2.41 ft/sec with RW, min guard assist for safety    Stairs Yes    Stairs Assistance 4: Min guard    Stairs Assistance Details (indicate cue type and reason) cues for sequencing and to advance hands on rails for improved weight shifting.    Stair Management Technique Two rails;Step to pattern;Forwards    Number of Stairs 4    Height of Stairs 6                      PT Short Term Goals - 03/18/21 0850       PT SHORT TERM GOAL #1   Title Improve Berg score from 34/56 to >/= 39/56 for reduced fall risk.    Baseline 03/18/21: 50/56 scored today    Time --    Period --    Status Achieved    Target Date --      PT SHORT TERM GOAL #2   Title Improve TUG score from 17.32 secs to </= 14.5 secs without use of device to increase safety with ambulation.    Baseline 03/18/21: 14.41 secs with RW, need to recheck with no device    Time --    Period --    Status Partially Met    Target Date 03/20/21      PT SHORT TERM GOAL #3   Title Increase gait velocity from 2.25 ft/sec without device to >/= 2.6 ft/sec with CGA for incr. gait efficiency.    Baseline 27/27/22: 2/41 ft/sec with RW, check with no device    Time 4    Period Weeks    Status Partially Met    Target Date --      PT SHORT TERM GOAL #4   Title Assess DGI and establish goal as appropriate.    Time 4    Period Weeks    Status New    Target Date 03/20/21      PT SHORT TERM GOAL #5   Title Pt wil amb.  350' with CGA on flat, even surface without LOB.    Baseline 03/18/21: met in session with RW    Time --    Period --    Status Achieved    Target Date --      PT SHORT TERM GOAL #6   Title Independent in HEP for balance and strengthening exercises.    Baseline 03/18/21: met with current HEP per pt report    Time --    Period --    Status Achieved    Target Date 03/20/21               PT Long Term Goals - 03/13/21 1544       PT LONG TERM GOAL #1   Title Improve Berg score from 34/56 to >/= 50/56 to reduce fall risk.    Baseline 34/56 on 02-19-21    Time 12    Period Weeks    Status New      PT LONG TERM GOAL #2   Title Pt will ambulate 1000' modified independently without device on even and uneven surfaces.    Time 12    Period Weeks    Status New      PT LONG TERM GOAL #3   Title Improve TUG score from  17.32 secs to </= 13 secs to reduce fall risk.    Baseline 17.32 secs on 02-19-21 with CGA    Time 12    Period Weeks    Status New      PT LONG TERM GOAL #4   Title Improve DGI by at least 10 points to increase safety with gait.    Time 12    Period Weeks    Status New      PT LONG TERM GOAL #5   Title Independent in updated HEP as appropriate.    Time 12    Period Weeks    Status New      PT LONG TERM GOAL #6   Title Increase LE FOTO score by at least 10 points to demo improvement in functional status.    Baseline 59/100 on 02-19-21    Time 12    Period Weeks    Status New                   Plan - 03/18/21 0849     Clinical Impression Statement Today's skilled session continued with gait training with RW as pt is more stable with it vs without it. Check some STGs with pt meeting gait goals with RW. Will plan to check the same gait goals without a device at next session for comparrison and also have pt complete the DGI without an device to establish her baseline score. The pt did improve her Berg Balance Test score to 50/56. The pt is  progressing and should benefit from continued PT to progress toward unmet goals.    Personal Factors and Comorbidities Comorbidity 2;Past/Current Experience;Transportation    Comorbidities migraines, epilepsy with seizure disorder, DM, CAD, s/p cataract sx on bil. eyes, memory deficit    Examination-Activity Limitations Bathing;Stand;Transfers;Carry;Squat;Stairs;Locomotion Level;Hygiene/Grooming;Bend    Examination-Participation Restrictions Cleaning;Community Activity;Driving;Laundry;Shop;Meal Prep    Stability/Clinical Decision Making Evolving/Moderate complexity    Rehab Potential Good    PT Frequency 2x / week    PT Duration 12 weeks    PT Treatment/Interventions ADLs/Self Care Home Management;Aquatic Therapy;DME Instruction;Gait training;Stair training;Therapeutic activities;Therapeutic exercise;Balance training;Neuromuscular re-education;Patient/family education    PT Next Visit Plan Check STG gait goals with no device, perform DGI for baseline score. Cont gait training without RW, balance and coordination exercises; RLE strengthening    PT Home Exercise Plan balance HEP issued on 03-12-21    Consulted and Agree with Plan of Care Patient             Patient will benefit from skilled therapeutic intervention in order to improve the following deficits and impairments:  Abnormal gait, Decreased activity tolerance, Decreased endurance, Decreased balance, Decreased coordination, Decreased strength, Impaired UE functional use  Visit Diagnosis: Unsteadiness on feet  Other abnormalities of gait and mobility  Other lack of coordination     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[place]) 03/09/2021   Cerebellar stroke ([place]) 01/30/2021   Acute cerebrovascular accident (CVA) ([place]) 01/20/2021   Acute CVA (cerebrovascular accident) ([place]) 01/20/2021   GERD (gastroesophageal reflux disease)    Coronary artery disease    Depression  01/06/2020   Pneumonia 01/05/2020   History of chest pain 09/26/2018   Dyspnea on exertion 09/08/2018   Chest pain, rule out acute myocardial infarction 10/30/2017   Edema 01/25/2017   Tobacco abuse 06/10/2016   Headache 09/15/2015   OSA (obstructive sleep apnea) 07/30/2015   Nephrolithiasis 07/30/2015   Vasovagal  syncope 07/30/2015   Carotid artery stenosis, asymptomatic 07/30/2015   CAD (coronary artery disease) 08/22/2013   Essential hypertension 08/22/2013   Diabetes mellitus without complication ([place])    Dyslipidemia    Seizures ([place])    Generalized convulsive epilepsy ([place]) 03/22/2013   Memory deficit 03/22/2013    Willow Ora, PTA, [facility] [address] [place], [place] [address] [phone] 03/18/21, 6:06 PM   Name: Samantha Huffman MRN: [id number] Date of Birth: November 08, 1955

## 2021-03-18 NOTE — Patient Instructions (Signed)
  In the moment, you are making impulsive decisions that affect your safety. After the fact, you can say why the task (ironing, driving, vacuuming) aren't safe since you had the stroke.  Try to stop and think before you do a task with something hot, heavy, sharp  Family and friends: when you catch Samantha Huffman attempting something unsafe, have a conversation and help her understand why it is not safe (her hand  and leg aren't stable and they are shaky, she is having problems with attention and focus)  Stop and think: Is this safe?

## 2021-03-18 NOTE — Therapy (Signed)
[facility] [address] [place], [place], [address] Phone: [phone]   Fax:  [phone]  Occupational Therapy Treatment  Patient Details  Name: Samantha Huffman MRN: [id number] Date of Birth: 1956/01/26 Referring Provider (OT): Danella Sensing, NP   Encounter Date: 03/18/2021   OT End of Session - 03/18/21 1220     Visit Number 4    Number of Visits 25    Date for OT Re-Evaluation 06/08/21    Authorization - Visit Number 4    Authorization - Number of Visits 10    Progress Note Due on Visit 10    OT Start Time 0805    OT Stop Time 0845    OT Time Calculation (min) 40 min             Past Medical History:  Diagnosis Date   Carotid artery stenosis    1-39% bilateral stenosis by dopplers 08/2020   Coronary artery disease    a. s/p CABG 10/2012.  cath 05/2016 with moderate LM stenosis, mild LCx and RCA stenosis and occluded LAD with patent LIMA to LAD, free radial to OM.  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   Dyslipidemia    Family history of adverse reaction to anesthesia    mother and sister ponv   Generalized convulsive epilepsy without mention of intractable epilepsy    GERD (gastroesophageal reflux disease)    History of kidney stones    Hypertension    Memory deficit    slight   Migraine    "controlled on daily RX " (06/11/2016)   Nephrolithiasis 07/30/2015   S/p lithotripsy x 3 and right and left ureter stents   OSA (obstructive sleep apnea)    cpap    PONV (postoperative nausea and vomiting)    Seizures ([place])    "controlled w/daily RX; started in 2012; dr thinks they might be from the migraines; last sz was early part of 2016" (06/11/2016)   Type II diabetes mellitus ([place])    metformin   Vasovagal syncope 07/30/2015   2011    Past Surgical History:  Procedure Laterality Date   CARDIAC CATHETERIZATION  2014; 06/11/2016   CARDIAC CATHETERIZATION N/A 06/11/2016   Procedure: Left Heart Cath  and Cors/Grafts Angiography;  Surgeon: Sherren Mocha, MD;  Location: [facility];  Service: Cardiovascular;  Laterality: N/A;   CESAREAN SECTION     CORONARY ANGIOPLASTY     CORONARY ARTERY BYPASS GRAFT  March, 2014   LIMA to LAD, left radial to LCx x 2   CYSTOSCOPY W/ URETERAL STENT PLACEMENT Left 01/16/2019   Procedure: CYSTOSCOPY WITH RETROGRADE PYELOGRAM/URETERAL LEFT STENT PLACEMENT;  Surgeon: Ardis Hughs, MD;  Location: [facility];  Service: Urology;  Laterality: Left;   CYSTOSCOPY/URETEROSCOPY/HOLMIUM LASER/STENT PLACEMENT Left 01/24/2019   Procedure: CYSTOSCOPY, LEFT URETEROSCOPY, HOLMIUM LASER, STONE REMOVAL AND STENT EXCHANGE;  Surgeon: Ardis Hughs, MD;  [facility]: [facility];  Service: Urology;  Laterality: Left;   LITHOTRIPSY  X 3   RIGHT/LEFT HEART CATH AND CORONARY/GRAFT ANGIOGRAPHY N/A 09/08/2018   Procedure: RIGHT/LEFT HEART CATH AND CORONARY/GRAFT ANGIOGRAPHY;  Surgeon: Martinique, Peter M, MD;  Location: [facility];  Service: Cardiovascular;  Laterality: N/A;   TOTAL ABDOMINAL HYSTERECTOMY     ovaries took before hysterectomy   TUBAL LIGATION     URETERAL STENT PLACEMENT      There were no vitals filed for this visit.   Subjective Assessment - 03/18/21 [id number]  Pertinent History CVA with R-sided weakness/ataxia.  PMH:  CAD, HTN, DM, OSA, seizure disorder, dyslipidemia, migraine, CABG (2014), recent bilateral cataract surgeries just prior to CVA    Patient Stated Goals return to driving, return to living alone, cook    Currently in Pain? No/denies                 Treatment: Cane exercises for: shoulder flexion chest press, shoulder abduction, biceps curls with emphasis on controlled movements, min v.c Standing at sink rocking forwards and backwards for weightbearing and scapular stability. Discussed use of tub bench, therapist recommends as pt is currently bathing in bottom of tub which is a fall hazard.  Pt was provided with information  regarding purchase of tub transfer bench Copying small peg design for increased fine motor coordination, min difficulty/ v.c for compensation for tremors in RUE                   OT Short Term Goals - 03/18/21 [id number]       OT SHORT TERM GOAL #1   Title Pt will be independent with initial HEP.--Check initial STGs (#1-4)  04/09/21    Time 4    Period Weeks    Status New      OT SHORT TERM GOAL #2   Title Pt will demo improved coordination and functional reach for ADLs as shown by improving score on box and blocks test by at least 6.    Baseline 42 blocks    Time 4    Period Weeks    Status New      OT SHORT TERM GOAL #3   Title Pt will perform bathing with min A.    Time 4    Period Weeks    Status New      OT SHORT TERM GOAL #4   Title Pt will perofrm UB dressing mod I.    Time 4    Period Weeks    Status New      OT SHORT TERM GOAL #5   Title Pt will perform LB dressing with min A.--check STGs #5-8  05/10/21    Time 8    Period Weeks    Status New      OT SHORT TERM GOAL #6   Title Pt will perform bathing with supervision.    Time 8    Period Weeks    Status New      OT SHORT TERM GOAL #7   Title Pt will peform simple home maintenane task and snack prep mod I.    Time 8    Period Weeks    Status New      OT SHORT TERM GOAL #8   Title Pt will cut food mod I using AE prn.    Time 8    Period Weeks    Status New               OT Long Term Goals - 03/10/21 1519       OT LONG TERM GOAL #1   Title Pt will be independent with updated HEP--check LTGs 06/08/21    Time 12    Period Weeks    Status New      OT LONG TERM GOAL #2   Title Pt will perform BADLs mod I with AE/DME and strategies prn.    Time 12    Period Weeks    Status New      OT LONG TERM GOAL #3  Title Pt wil be for memory/cognitive compensation strategies for ADL/IADLs prn.    Time 12    Period Weeks    Status New      OT LONG TERM GOAL #4   Title Pt will perform  simple cooking task with supervision.    Time 12    Period Weeks    Status New      OT LONG TERM GOAL #5   Title Pt will perform environmental scanning/navigation with at least 90% accuracy (with head turns) without reports of dizziness/LOB.    Time 12    Period Weeks    Status New      OT LONG TERM GOAL #6   Title Pt will improve RUE coordination as shown by completing 9-hole peg test in less than 36sec without drops.    Time 12    Period Weeks    Status New                   Plan - 03/18/21 1210     Clinical Impression Statement Pt is progressing towards goals with improving RUE control and functional use. Pt was provided with information regarding tub transfer bench for safety. As pt reports that she has been bathing by getting down into bottom of tub.    OT Occupational Profile and History Detailed Assessment- Review of Records and additional review of physical, cognitive, psychosocial history related to current functional performance    Occupational performance deficits (Please refer to evaluation for details): ADL's;IADL's;Leisure;Social Participation    Body Structure / Function / Physical Skills ADL;Decreased knowledge of use of DME;Strength;GMC;Dexterity;Balance;UE functional use;IADL;Endurance;ROM;Vision;Improper spinal/pelvic alignment;Coordination;Mobility;FMC    Cognitive Skills Attention;Memory;Understand    Rehab Potential Good    Clinical Decision Making Several treatment options, min-mod task modification necessary    Comorbidities Affecting Occupational Performance: May have comorbidities impacting occupational performance    Modification or Assistance to Complete Evaluation  Min-Moderate modification of tasks or assist with assess necessary to complete eval    OT Frequency 2x / week    OT Duration 12 weeks   +eval   OT Treatment/Interventions Self-care/ADL training;Moist Heat;DME and/or AE instruction;Balance training;Aquatic Therapy;Therapeutic  activities;Cognitive remediation/compensation;Therapeutic exercise;Cryotherapy;Neuromuscular education;Functional Mobility Training;Passive range of motion;Visual/perceptual remediation/compensation;Patient/family education;Manual Therapy;Energy conservation;Paraffin;Fluidtherapy    Plan wt. bearing/scapular stabilization HEP, continue to work towards goals    Consulted and Agree with Plan of Care Patient;Family member/caregiver    Family Member Consulted dtr             Patient will benefit from skilled therapeutic intervention in order to improve the following deficits and impairments:   Body Structure / Function / Physical Skills: ADL, Decreased knowledge of use of DME, Strength, GMC, Dexterity, Balance, UE functional use, IADL, Endurance, ROM, Vision, Improper spinal/pelvic alignment, Coordination, Mobility, FMC Cognitive Skills: Attention, Memory, Understand     Visit Diagnosis: Other lack of coordination  Ataxia  Visuospatial deficit  Attention and concentration deficit  Other symptoms and signs involving cognitive functions following cerebral infarction    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[place]) 03/09/2021   Cerebellar stroke ([place]) 01/30/2021   Acute cerebrovascular accident (CVA) ([place]) 01/20/2021   Acute CVA (cerebrovascular accident) ([place]) 01/20/2021   GERD (gastroesophageal reflux disease)    Coronary artery disease    Depression 01/06/2020   Pneumonia 01/05/2020   History of chest pain 09/26/2018   Dyspnea on exertion 09/08/2018   Chest pain, rule out acute myocardial  infarction 10/30/2017   Edema 01/25/2017   Tobacco abuse 06/10/2016   Headache 09/15/2015   OSA (obstructive sleep apnea) 07/30/2015   Nephrolithiasis 07/30/2015   Vasovagal syncope 07/30/2015   Carotid artery stenosis, asymptomatic 07/30/2015   CAD (coronary artery disease) 08/22/2013   Essential hypertension 08/22/2013   Diabetes  mellitus without complication ([place])    Dyslipidemia    Seizures ([place])    Generalized convulsive epilepsy ([place]) 03/22/2013   Memory deficit 03/22/2013    Samantha Huffman 03/18/2021, 12:20 PM Samantha Huffman, OTR/L [phone] Phone: [phone] 12:21 PM 03/18/21  [facility] [address] [place], [place], [address] Phone: [phone]   Fax:  [phone]  Name: Lazette Lem[place] MRN: [id number] Date of Birth: 23-May-1956

## 2021-03-20 ENCOUNTER — Other Ambulatory Visit: Payer: Self-pay

## 2021-03-20 ENCOUNTER — Ambulatory Visit: Payer: Medicare HMO | Admitting: Occupational Therapy

## 2021-03-20 ENCOUNTER — Ambulatory Visit: Payer: Medicare HMO

## 2021-03-20 ENCOUNTER — Ambulatory Visit: Payer: Medicare HMO | Admitting: Physical Therapy

## 2021-03-20 VITALS — BP 149/81

## 2021-03-20 DIAGNOSIS — R41840 Attention and concentration deficit: Secondary | ICD-10-CM

## 2021-03-20 DIAGNOSIS — R41841 Cognitive communication deficit: Secondary | ICD-10-CM

## 2021-03-20 DIAGNOSIS — I69318 Other symptoms and signs involving cognitive functions following cerebral infarction: Secondary | ICD-10-CM

## 2021-03-20 DIAGNOSIS — R482 Apraxia: Secondary | ICD-10-CM

## 2021-03-20 DIAGNOSIS — R2681 Unsteadiness on feet: Secondary | ICD-10-CM

## 2021-03-20 DIAGNOSIS — R41842 Visuospatial deficit: Secondary | ICD-10-CM

## 2021-03-20 DIAGNOSIS — R278 Other lack of coordination: Secondary | ICD-10-CM

## 2021-03-20 DIAGNOSIS — R2689 Other abnormalities of gait and mobility: Secondary | ICD-10-CM | POA: Diagnosis not present

## 2021-03-20 DIAGNOSIS — R4701 Aphasia: Secondary | ICD-10-CM

## 2021-03-20 NOTE — Therapy (Signed)
[facility] [address] [place], [place], [address] Phone: [phone]   Fax:  [phone]  Speech Language Pathology Treatment  Patient Details  Name: Samantha Huffman MRN: [id number] Date of Birth: [birth year] Referring Provider (SLP): Lauraine Rinne, [place]   Encounter Date: 03/20/2021   End of Session - 03/20/21 0944     Visit Number 5    Number of Visits 25    Date for SLP Re-Evaluation 05/20/21    SLP Start Time 0930    SLP Stop Time  [id number]    SLP Time Calculation (min) 45 min    Activity Tolerance Patient tolerated treatment well             Past Medical History:  Diagnosis Date   Carotid artery stenosis    1-39% bilateral stenosis by dopplers 08/2020   Coronary artery disease    a. s/p CABG 10/2012.  cath 05/2016 with moderate LM stenosis, mild LCx and RCA stenosis and occluded LAD with patent LIMA to LAD, free radial to OM.  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   Dyslipidemia    Family history of adverse reaction to anesthesia    mother and sister ponv   Generalized convulsive epilepsy without mention of intractable epilepsy    GERD (gastroesophageal reflux disease)    History of kidney stones    Hypertension    Memory deficit    slight   Migraine    "controlled on daily RX " (06/11/2016)   Nephrolithiasis 07/30/2015   S/p lithotripsy x 3 and right and left ureter stents   OSA (obstructive sleep apnea)    cpap    PONV (postoperative nausea and vomiting)    Seizures ([place])    "controlled w/daily RX; started in 2012; dr thinks they might be from the migraines; last sz was early part of 2016" (06/11/2016)   Type II diabetes mellitus ([place])    metformin   Vasovagal syncope 07/30/2015   2011    Past Surgical History:  Procedure Laterality Date   CARDIAC CATHETERIZATION  2014; 06/11/2016   CARDIAC CATHETERIZATION N/A 06/11/2016   Procedure: Left Heart Cath and Cors/Grafts Angiography;  Surgeon:  Sherren Mocha, MD;  Location: [facility];  Service: Cardiovascular;  Laterality: N/A;   CESAREAN SECTION     CORONARY ANGIOPLASTY     CORONARY ARTERY BYPASS GRAFT  March, 2014   LIMA to LAD, left radial to LCx x 2   CYSTOSCOPY W/ URETERAL STENT PLACEMENT Left 01/16/2019   Procedure: CYSTOSCOPY WITH RETROGRADE PYELOGRAM/URETERAL LEFT STENT PLACEMENT;  Surgeon: Ardis Hughs, MD;  Location: [facility];  Service: Urology;  Laterality: Left;   CYSTOSCOPY/URETEROSCOPY/HOLMIUM LASER/STENT PLACEMENT Left 01/24/2019   Procedure: CYSTOSCOPY, LEFT URETEROSCOPY, HOLMIUM LASER, STONE REMOVAL AND STENT EXCHANGE;  Surgeon: Ardis Hughs, MD;  Location: [facility];  Service: Urology;  Laterality: Left;   LITHOTRIPSY  X 3   RIGHT/LEFT HEART CATH AND CORONARY/GRAFT ANGIOGRAPHY N/A 09/08/2018   Procedure: RIGHT/LEFT HEART CATH AND CORONARY/GRAFT ANGIOGRAPHY;  Surgeon: Martinique, Peter M, MD;  Location: [facility];  Service: Cardiovascular;  Laterality: N/A;   TOTAL ABDOMINAL HYSTERECTOMY     ovaries took before hysterectomy   TUBAL LIGATION     URETERAL STENT PLACEMENT      There were no vitals filed for this visit.   Subjective Assessment - 03/20/21 1225     Subjective "I'm doing okay"    Currently in Pain? No/denies  ADULT SLP TREATMENT - 03/20/21 0958       General Information   Behavior/Cognition Alert;Pleasant mood;Cooperative      Treatment Provided   Treatment provided Cognitive-Linquistic      Cognitive-Linquistic Treatment   Treatment focused on Aphasia;Dysarthria;Cognition;Patient/family/caregiver education    Skilled Treatment Samantha Huffman returned with purple folder. Pt unable to verbally recall last ST session, despite usual max SLP cues. Pt did not recall working with another female therapist despite SLP reading note aloud. SLP targeted sequencing 3 photos to address word finding and apraxia of speech, in which pt able to sequence with good accuracy.  Anomia x2 exhibited, which improved with min phonemic cues. Dysnomia x3 exhibited (ex: pollen for pulp, torture for taunt), which pt able to correct with min SLP A. Occasional questioning cues required to name specifics of photos due to empty speech. Pt stated "it makes me tired because I have to think." Intermittent sniff-blow into tissue performed to address breath support. Usual fading to occasional cues required to perform sniff and blow. SLP cued slow rate and relaxed breathing, which was effective. Of note, change in vocal quality exhibited throughout session as pt was less high pitched by end of session. When SLP provided positive reinforcement for voicing, pt stated "I noticed it too. I wanted to see if anyone else noticed it."      Assessment / Recommendations / Plan   Plan Continue with current plan of care      Progression Toward Goals   Progression toward goals Progressing toward goals              SLP Education - 03/20/21 1600     Education Details abdominal breathing, blowing into tissue    Person(s) Educated Patient    Methods Explanation;Demonstration    Comprehension Verbalized understanding;Returned demonstration;Need further instruction              SLP Short Term Goals - 03/20/21 0945       SLP SHORT TERM GOAL #1   Title pt will engage in cognitive linguistic and speech strength/coordination testing and goals added as necessary    Status Achieved    Target Date 03/05/21      SLP SHORT TERM GOAL #2   Title using compensations, Samantha Huffman will remind family when she needs to take meds (pt is now dependent upon family) 80% of opportunities between 3 sessions    Time 3    Period Weeks    Status On-going    Target Date 03/20/21      SLP SHORT TERM GOAL #3   Title pt will name 10 items in a simple category with rare verbal cues x3 sessions    Time 3    Period Weeks    Status On-going    Target Date 03/20/21      SLP SHORT TERM GOAL #4   Title pt will  independently use anomia compensations in 5 minutes simple conversation x3 sessions    Baseline 03-12-21    Time 3    Period Weeks    Status On-going    Target Date 03/20/21      SLP SHORT TERM GOAL #5   Title pt will exhibit 100% intelligibility in 5 minutes simple conversation using speech compensations, x3 sessions    Time 3    Period Weeks    Status On-going    Target Date 03/20/21      SLP SHORT TERM GOAL #6   Title Samantha Huffman will complete a speech HEP  with rare min A x2 sessions    Time 3    Period Weeks    Status On-going    Target Date 03/20/21      SLP SHORT TERM GOAL #7   Title Pt will verbalize and demonstrate 3 attention/memory strategies to aid functioning home with min A over 2 sessions    Time 3    Period Weeks    Status On-going              SLP Long Term Goals - 03/20/21 0957       SLP LONG TERM GOAL #1   Title pt will complete speech HEP independently x3 sessions    Time 7    Period Weeks    Status On-going      SLP LONG TERM GOAL #2   Title in 10 minutes simple conversation pt will exhibit functional fluid speech using compensations in 2 sessions    Time 7    Period Weeks    Status On-going      SLP LONG TERM GOAL #3   Title in 10 minutes mod complex conversation pt will exhibit functional fluid speech using compensations in 2 sessions    Time 11    Period Weeks    Status On-going      SLP LONG TERM GOAL #4   Title pt will complete VNeST and SFA tasks with rare min A in 3 sessions    Time 7    Period Weeks    Status On-going      SLP LONG TERM GOAL #5   Title Samantha Huffman will exhibit functional word-finding using compensations in 10 mintues mod complex conversation in 3 sessions    Time 11    Period Weeks    Status On-going      SLP LONG TERM GOAL #6   Title Samantha Huffman will independently take meds for 2 weeks as reported by Samantha Huffman or family    Time 11    Period Weeks    Status On-going      SLP LONG TERM GOAL #7   Title Pt will verbalize and  demonstrate 3 attention/memory strategies to aid functioning at work and home with rare min A over 2 sessions    Time 11    Period Weeks    Status On-going              Plan - 03/20/21 1601     Clinical Impression Statement 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 SLP targeted word finding for sequencing task, with occasional anomia/dysnomia exhibited as well as empty speech. Pt benefited from occasional min A and questioning cues to aid word finding. Change in pitch addressed as pt intially demonstrated elevated pitch. As session progressed, WNL pitch achieved with rare min A. At this time, she is dependent on family and friends, however they also require training for activities that Samantha Huffman can safely do and to help her progress to more independence. Continue skilled ST to maximize cognition, languae and intelligibility for safety, independence and to reduce caregiver burden.    Speech Therapy Frequency 2x / week    Duration 12 weeks    Treatment/Interventions Language facilitation;Cueing hierarchy;SLP instruction and feedback;Cognitive reorganization;Compensatory strategies;Internal/external aids;Patient/family education;Functional tasks;Environmental controls;Oral motor exercises    Potential to Achieve Goals Good    Consulted and Agree with Plan of Care Patient             Patient will benefit from skilled therapeutic intervention in order to improve  the following deficits and impairments:   Aphasia  Verbal apraxia  Cognitive communication deficit    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[place]) 03/09/2021   Cerebellar stroke ([place]) 01/30/2021   Acute cerebrovascular accident (CVA) ([place]) 01/20/2021   Acute CVA (cerebrovascular accident) ([place]) 01/20/2021   GERD (gastroesophageal reflux disease)    Coronary artery disease    Depression 01/06/2020   Pneumonia 01/05/2020   History of chest pain 09/26/2018    Dyspnea on exertion 09/08/2018   Chest pain, rule out acute myocardial infarction 10/30/2017   Edema 01/25/2017   Tobacco abuse 06/10/2016   Headache 09/15/2015   OSA (obstructive sleep apnea) 07/30/2015   Nephrolithiasis 07/30/2015   Vasovagal syncope 07/30/2015   Carotid artery stenosis, asymptomatic 07/30/2015   CAD (coronary artery disease) 08/22/2013   Essential hypertension 08/22/2013   Diabetes mellitus without complication ([place])    Dyslipidemia    Seizures ([place])    Generalized convulsive epilepsy ([place]) 03/22/2013   Memory deficit 03/22/2013    Alinda Deem, MA CCC-SLP 03/20/2021, 4:06 PM  [facility] [address] [place], [place], [address] Phone: [phone]   Fax:  [phone]   Name: Samantha Huffman MRN: [id number] Date of Birth: November 18, 1955

## 2021-03-20 NOTE — Patient Instructions (Signed)
  Breath in through your nose (smell the flowers) Breath out through your mouth while blowing into tissue  Perform repetition x5  Stay relaxed through your chest and neck

## 2021-03-20 NOTE — Therapy (Signed)
[facility] [address] [place], [place], [address] Phone: [phone]   Fax:  [phone]  Occupational Therapy Treatment  Patient Details  Name: Samantha Huffman MRN: [id number] Date of Birth: 06/01/1956 Referring Provider (OT): Danella Sensing, NP   Encounter Date: 03/20/2021   OT End of Session - 03/20/21 1055     Visit Number 5    Number of Visits 25    Date for OT Re-Evaluation 06/08/21    Authorization Type [employer] (recert [id number] due to delay in start of OT due to schedule conflicts)    Authorization - Visit Number 5    Authorization - Number of Visits 10    OT Start Time [id number]    OT Stop Time 0845    OT Time Calculation (min) 39 min             Past Medical History:  Diagnosis Date   Carotid artery stenosis    1-39% bilateral stenosis by dopplers 08/2020   Coronary artery disease    a. s/p CABG 10/2012.  cath 05/2016 with moderate LM stenosis, mild LCx and RCA stenosis and occluded LAD with patent LIMA to LAD, free radial to OM.  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   Dyslipidemia    Family history of adverse reaction to anesthesia    mother and sister ponv   Generalized convulsive epilepsy without mention of intractable epilepsy    GERD (gastroesophageal reflux disease)    History of kidney stones    Hypertension    Memory deficit    slight   Migraine    "controlled on daily RX " (06/11/2016)   Nephrolithiasis 07/30/2015   S/p lithotripsy x 3 and right and left ureter stents   OSA (obstructive sleep apnea)    cpap    PONV (postoperative nausea and vomiting)    Seizures ([place])    "controlled w/daily RX; started in 2012; dr thinks they might be from the migraines; last sz was early part of 2016" (06/11/2016)   Type II diabetes mellitus ([place])    metformin   Vasovagal syncope 07/30/2015   2011    Past Surgical History:  Procedure Laterality Date   CARDIAC CATHETERIZATION  2014;  06/11/2016   CARDIAC CATHETERIZATION N/A 06/11/2016   Procedure: Left Heart Cath and Cors/Grafts Angiography;  Surgeon: Sherren Mocha, MD;  Location: [facility];  Service: Cardiovascular;  Laterality: N/A;   CESAREAN SECTION     CORONARY ANGIOPLASTY     CORONARY ARTERY BYPASS GRAFT  March, 2014   LIMA to LAD, left radial to LCx x 2   CYSTOSCOPY W/ URETERAL STENT PLACEMENT Left 01/16/2019   Procedure: CYSTOSCOPY WITH RETROGRADE PYELOGRAM/URETERAL LEFT STENT PLACEMENT;  Surgeon: Ardis Hughs, MD;  [facility];  Service: Urology;  Laterality: Left;   CYSTOSCOPY/URETEROSCOPY/HOLMIUM LASER/STENT PLACEMENT Left 01/24/2019   Procedure: CYSTOSCOPY, LEFT URETEROSCOPY, HOLMIUM LASER, STONE REMOVAL AND STENT EXCHANGE;  Surgeon: Ardis Hughs, MD;  Location: [facility];  Service: Urology;  Laterality: Left;   LITHOTRIPSY  X 3   RIGHT/LEFT HEART CATH AND CORONARY/GRAFT ANGIOGRAPHY N/A 09/08/2018   Procedure: RIGHT/LEFT HEART CATH AND CORONARY/GRAFT ANGIOGRAPHY;  Surgeon: Martinique, Peter M, MD;  Location: [facility];  Service: Cardiovascular;  Laterality: N/A;   TOTAL ABDOMINAL HYSTERECTOMY     ovaries took before hysterectomy   TUBAL LIGATION     URETERAL STENT PLACEMENT      There were no vitals filed for this  visit.   Subjective Assessment - 03/20/21 1054     Subjective  Denies pain    Pertinent History CVA with R-sided weakness/ataxia.  PMH:  CAD, HTN, DM, OSA, seizure disorder, dyslipidemia, migraine, CABG (2014), recent bilateral cataract surgeries just prior to CVA    Patient Stated Goals return to driving, return to living alone, cook    Currently in Pain? No/denies                  Treatment: Ambulating with pt's new walker seat/ tray, pt practiced ambulating with a cup of water from kitchen to table. Pt performed without spills and she retrieved the water herself. Standing to perform functional reaching with RUE for increased control, min v.c, pt became  tearful regarding tremor. Reassurance provided. Cane exercises for shoulder flexion abduction and chest press, pt demonstrates improved control. Fine motor coordination task, placing grooved pegs into pegboard with RUE and removing with in hand manipulation, mod difficulty.                  OT Short Term Goals - 03/20/21 [id number]       OT SHORT TERM GOAL #1   Title Pt will be independent with initial HEP.--Check initial STGs (#1-4)  04/09/21    Time 4    Period Weeks    Status On-going      OT SHORT TERM GOAL #2   Title Pt will demo improved coordination and functional reach for ADLs as shown by improving score on box and blocks test by at least 6.    Baseline 42 blocks    Time 4    Period Weeks    Status On-going      OT SHORT TERM GOAL #3   Title Pt will perform bathing with min A.    Time 4    Period Weeks    Status On-going      OT SHORT TERM GOAL #4   Title Pt will perofrm UB dressing mod I.    Time 4    Period Weeks    Status Achieved      OT SHORT TERM GOAL #5   Title Pt will perform LB dressing with min A.--check STGs #5-8  05/10/21    Time 8    Period Weeks    Status Achieved      OT SHORT TERM GOAL #6   Title Pt will perform bathing with supervision.    Time 8    Period Weeks    Status New      OT SHORT TERM GOAL #7   Title Pt will peform simple home maintenane task and snack prep mod I.    Time 8    Period Weeks    Status New      OT SHORT TERM GOAL #8   Title Pt will cut food mod I using AE prn.    Time 8    Period Weeks    Status New               OT Long Term Goals - 03/10/21 1519       OT LONG TERM GOAL #1   Title Pt will be independent with updated HEP--check LTGs 06/08/21    Time 12    Period Weeks    Status New      OT LONG TERM GOAL #2   Title Pt will perform BADLs mod I with AE/DME and strategies prn.    Time 12  Period Weeks    Status New      OT LONG TERM GOAL #3   Title Pt wil be for memory/cognitive  compensation strategies for ADL/IADLs prn.    Time 12    Period Weeks    Status New      OT LONG TERM GOAL #4   Title Pt will perform simple cooking task with supervision.    Time 12    Period Weeks    Status New      OT LONG TERM GOAL #5   Title Pt will perform environmental scanning/navigation with at least 90% accuracy (with head turns) without reports of dizziness/LOB.    Time 12    Period Weeks    Status New      OT LONG TERM GOAL #6   Title Pt will improve RUE coordination as shown by completing 9-hole peg test in less than 36sec without drops.    Time 12    Period Weeks    Status New                   Plan - 03/20/21 1055     Clinical Impression Statement Pt is progressing towards goals with improving RUE control and functional use.    OT Occupational Profile and History Detailed Assessment- Review of Records and additional review of physical, cognitive, psychosocial history related to current functional performance    Occupational performance deficits (Please refer to evaluation for details): ADL's;IADL's;Leisure;Social Participation    Body Structure / Function / Physical Skills ADL;Decreased knowledge of use of DME;Strength;GMC;Dexterity;Balance;UE functional use;IADL;Endurance;ROM;Vision;Improper spinal/pelvic alignment;Coordination;Mobility;FMC    Cognitive Skills Attention;Memory;Understand    Rehab Potential Good    Clinical Decision Making Several treatment options, min-mod task modification necessary    Comorbidities Affecting Occupational Performance: May have comorbidities impacting occupational performance    Modification or Assistance to Complete Evaluation  Min-Moderate modification of tasks or assist with assess necessary to complete eval    OT Frequency 2x / week    OT Duration 12 weeks   +eval   OT Treatment/Interventions Self-care/ADL training;Moist Heat;DME and/or AE instruction;Balance training;Aquatic Therapy;Therapeutic activities;Cognitive  remediation/compensation;Therapeutic exercise;Cryotherapy;Neuromuscular education;Functional Mobility Training;Passive range of motion;Visual/perceptual remediation/compensation;Patient/family education;Manual Therapy;Energy conservation;Paraffin;Fluidtherapy    Plan wt. bearing/scapular stabilization HEP, fine motor coordination/ functional use of UE's    Consulted and Agree with Plan of Care Patient;Family member/caregiver    Family Member Consulted dtr             Patient will benefit from skilled therapeutic intervention in order to improve the following deficits and impairments:   Body Structure / Function / Physical Skills: ADL, Decreased knowledge of use of DME, Strength, GMC, Dexterity, Balance, UE functional use, IADL, Endurance, ROM, Vision, Improper spinal/pelvic alignment, Coordination, Mobility, [facility] Cognitive Skills: Attention, Memory, Understand     Visit Diagnosis: Other lack of coordination  Visuospatial deficit  Attention and concentration deficit  Other symptoms and signs involving cognitive functions following cerebral infarction  Unsteadiness on feet    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[place]) 03/09/2021   Cerebellar stroke ([place]) 01/30/2021   Acute cerebrovascular accident (CVA) ([place]) 01/20/2021   Acute CVA (cerebrovascular accident) ([place]) 01/20/2021   GERD (gastroesophageal reflux disease)    Coronary artery disease    Depression 01/06/2020   Pneumonia 01/05/2020   History of chest pain 09/26/2018   Dyspnea on exertion 09/08/2018   Chest pain, rule out acute myocardial infarction 10/30/2017  Edema 01/25/2017   Tobacco abuse 06/10/2016   Headache 09/15/2015   OSA (obstructive sleep apnea) 07/30/2015   Nephrolithiasis 07/30/2015   Vasovagal syncope 07/30/2015   Carotid artery stenosis, asymptomatic 07/30/2015   CAD (coronary artery disease) 08/22/2013   Essential hypertension 08/22/2013    Diabetes mellitus without complication ([place])    Dyslipidemia    Seizures ([place])    Generalized convulsive epilepsy ([place]) 03/22/2013   Memory deficit 03/22/2013    Waynesha Rammel 03/20/2021, 10:56 AM  [facility] [address] [place], [place], [address] Phone: [phone]   Fax:  [phone]  Name: Onetta Wujek MRN: [id number] Date of Birth: July 05, 1956

## 2021-03-20 NOTE — Therapy (Signed)
[facility] [address] [place], [place], [address] Phone: [phone]   Fax:  [phone]  Physical Therapy Treatment  Patient Details  Name: Samantha Huffman MRN: [id number] Date of Birth: [birth year] Referring Provider (PT): Lauraine Rinne, PA-C   Encounter Date: 03/20/2021   PT End of Session - 03/20/21 0852     Visit Number 5    Number of Visits 25    Date for PT Re-Evaluation 05/15/21    Authorization Type [employer]    Authorization Time Period 02-19-21 - 05-21-21    Progress Note Due on Visit 10    PT Start Time 0849    PT Stop Time 0930    PT Time Calculation (min) 41 min    Equipment Utilized During Treatment Gait belt    Activity Tolerance Patient tolerated treatment well    Behavior During Therapy [facility] for tasks assessed/performed             Past Medical History:  Diagnosis Date   Carotid artery stenosis    1-39% bilateral stenosis by dopplers 08/2020   Coronary artery disease    a. s/p CABG 10/2012.  cath 05/2016 with moderate LM stenosis, mild LCx and RCA stenosis and occluded LAD with patent LIMA to LAD, free radial to OM.  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   Dyslipidemia    Family history of adverse reaction to anesthesia    mother and sister ponv   Generalized convulsive epilepsy without mention of intractable epilepsy    GERD (gastroesophageal reflux disease)    History of kidney stones    Hypertension    Memory deficit    slight   Migraine    "controlled on daily RX " (06/11/2016)   Nephrolithiasis 07/30/2015   S/p lithotripsy x 3 and right and left ureter stents   OSA (obstructive sleep apnea)    cpap    PONV (postoperative nausea and vomiting)    Seizures ([place])    "controlled w/daily RX; started in 2012; dr thinks they might be from the migraines; last sz was early part of 2016" (06/11/2016)   Type II diabetes mellitus ([place])    metformin   Vasovagal syncope 07/30/2015    2011    Past Surgical History:  Procedure Laterality Date   CARDIAC CATHETERIZATION  2014; 06/11/2016   CARDIAC CATHETERIZATION N/A 06/11/2016   Procedure: Left Heart Cath and Cors/Grafts Angiography;  Surgeon: Sherren Mocha, MD;  Location: [facility];  Service: Cardiovascular;  Laterality: N/A;   CESAREAN SECTION     CORONARY ANGIOPLASTY     CORONARY ARTERY BYPASS GRAFT  March, 2014   LIMA to LAD, left radial to LCx x 2   CYSTOSCOPY W/ URETERAL STENT PLACEMENT Left 01/16/2019   Procedure: CYSTOSCOPY WITH RETROGRADE PYELOGRAM/URETERAL LEFT STENT PLACEMENT;  Surgeon: Ardis Hughs, MD;  Location: [facility];  Service: Urology;  Laterality: Left;   CYSTOSCOPY/URETEROSCOPY/HOLMIUM LASER/STENT PLACEMENT Left 01/24/2019   Procedure: CYSTOSCOPY, LEFT URETEROSCOPY, HOLMIUM LASER, STONE REMOVAL AND STENT EXCHANGE;  Surgeon: Ardis Hughs, MD;  Location: [facility];  Service: Urology;  Laterality: Left;   LITHOTRIPSY  X 3   RIGHT/LEFT HEART CATH AND CORONARY/GRAFT ANGIOGRAPHY N/A 09/08/2018   Procedure: RIGHT/LEFT HEART CATH AND CORONARY/GRAFT ANGIOGRAPHY;  Surgeon: Martinique, Peter M, MD;  Location: [facility];  Service: Cardiovascular;  Laterality: N/A;   TOTAL ABDOMINAL HYSTERECTOMY     ovaries took before hysterectomy   TUBAL LIGATION  URETERAL STENT PLACEMENT      Vitals:   03/20/21 0857  BP: (!) 149/81     Subjective Assessment - 03/20/21 0851     Subjective No new complaints. No falls or pain to report. Continues to use RW. Reports did not take her medication this morning because it makes her sleepy. Plans to take it when she gets home.    Pertinent History Carotid artery stenosis, HTN, epilepsy with seizure disorder - last seizure was in early 2016:  migraines, DM, memory deficit, CAD,                [facility] PT Assessment - 03/20/21 0902       Standardized Balance Assessment   Standardized Balance Assessment Dynamic Gait Index      Dynamic Gait Index    Level Surface Mild Impairment    Change in Gait Speed Mild Impairment    Gait with Horizontal Head Turns Moderate Impairment    Gait with Vertical Head Turns Moderate Impairment    Gait and Pivot Turn Mild Impairment    Step Over Obstacle Mild Impairment    Step Around Obstacles Mild Impairment    Steps Moderate Impairment    Total Score 13    DGI comment: mild dizziness reported with head movements. <19 predictive for falls      Timed Up and Go Test   TUG Normal TUG    Normal TUG (seconds) 15.18   no device with min guard assist                  [facility] Adult PT Treatment/Exercise - 03/20/21 0902       Transfers   Transfers Sit to Stand;Stand to Sit    Sit to Stand 4: Min guard;With upper extremity assist;From bed;From chair/3-in-1    Stand to Sit 4: Min guard;With upper extremity assist;To bed;To chair/3-in-1      Ambulation/Gait   Ambulation/Gait Yes    Ambulation/Gait Assistance 5: Supervision;4: Min guard    Ambulation/Gait Assistance Details use of RW to enter/exit session with supervision. No device used in session with min guard assist for safety.    Assistive device Standard walker;None    Gait Pattern Step-through pattern;Ataxic    Ambulation Surface Level;Indoor    Gait velocity 12.40= 2.65 ft/sec no AD      Neuro Re-ed    Neuro Re-ed Details  for strenthening/NMR: in parallel bars resisted gait with green band  pulling in posterior direciton (PTA behind pt) for forward/backward gait with bil UE support, cues for increased step length each way. then in lateral direction with pt stepping away from PTA against the resistance for 3 laps toward each side with UE support and cues for step length.                   PT Short Term Goals - 03/20/21 1037       PT SHORT TERM GOAL #1   Title Improve Berg score from 34/56 to >/= 39/56 for reduced fall risk.    Baseline 03/18/21: 50/56 scored today    Status Achieved      PT SHORT TERM GOAL #2   Title  Improve TUG score from 17.32 secs to </= 14.5 secs without use of device to increase safety with ambulation.    Baseline 03/18/21: 14.41 secs with RW, 15.18 sec's no AD, met with RW not no AD    Status Partially Met    Target Date 03/20/21  PT SHORT TERM GOAL #3   Title Increase gait velocity from 2.25 ft/sec without device to >/= 2.6 ft/sec with CGA for incr. gait efficiency.    Baseline 27/27/22: 2.41 ft/sec with RW with supervision, 2.65 ft/sec no AD with min guard assist, met with no AD    Status Achieved      PT SHORT TERM GOAL #4   Title Assess DGI and establish goal as appropriate.    Baseline 03/20/21: 13/24 scored today as baseline, baseline added to LTG    Status Achieved    Target Date 03/20/21      PT SHORT TERM GOAL #5   Title Pt wil amb. 350' with CGA on flat, even surface without LOB.    Baseline 03/18/21: met in session with RW    Status Achieved      PT SHORT TERM GOAL #6   Title Independent in HEP for balance and strengthening exercises.    Baseline 03/18/21: met with current HEP per pt report    Status Achieved    Target Date 03/20/21               PT Long Term Goals - 03/20/21 1040       PT LONG TERM GOAL #1   Title Improve Berg score from 34/56 to >/= 50/56 to reduce fall risk.. (all LTGs due 05/22/21)    Baseline 34/56 on 02-19-21    Time 12    Period Weeks    Status On-going      PT LONG TERM GOAL #2   Title Pt will ambulate 1000' modified independently without device on even and uneven surfaces.    Time 12    Period Weeks    Status On-going      PT LONG TERM GOAL #3   Title Improve TUG score from 17.32 secs to </= 13 secs to reduce fall risk.    Baseline 17.32 secs on 02-19-21 with CGA    Time 12    Period Weeks    Status On-going      PT LONG TERM GOAL #4   Title Improve DGI by at least 10 points to increase safety with gait.    Baseline 03/20/21: 13/24    Time 12    Period Weeks    Status On-going      PT LONG TERM GOAL #5   Title  Independent in updated HEP as appropriate.    Time 12    Period Weeks    Status On-going      PT LONG TERM GOAL #6   Title Increase LE FOTO score by at least 10 points to demo improvement in functional status.    Baseline 59/100 on 02-19-21    Time 12    Period Weeks    Status On-going                   Plan - 03/20/21 2376     Clinical Impression Statement Today's skilled session focused on progress toward gait goals with no AD with pt having slower time with Timed Up and Go not meeting that goal except with RW, pt did meet her gait speed goal with no AD (did not meet it with RW) and also set baseline for DGI with score of 13/24. Remainder of session worked on balance/strengthening with resisted gait in parallel bars. The pt is making steady progress toward goals and should benefit from continued PT to progress toward unmet goals.    Personal Factors  and Comorbidities Comorbidity 2;Past/Current Experience;Transportation    Comorbidities migraines, epilepsy with seizure disorder, DM, CAD, s/p cataract sx on bil. eyes, memory deficit    Examination-Activity Limitations Bathing;Stand;Transfers;Carry;Squat;Stairs;Locomotion Level;Hygiene/Grooming;Bend    Examination-Participation Restrictions Cleaning;Community Activity;Driving;Laundry;Shop;Meal Prep    Stability/Clinical Decision Making Evolving/Moderate complexity    Rehab Potential Good    PT Frequency 2x / week    PT Duration 12 weeks    PT Treatment/Interventions ADLs/Self Care Home Management;Aquatic Therapy;DME Instruction;Gait training;Stair training;Therapeutic activities;Therapeutic exercise;Balance training;Neuromuscular re-education;Patient/family education    PT Next Visit Plan Cont gait training without RW, balance and coordination exercises; RLE strengthening    PT Home Exercise Plan balance HEP issued on 03-12-21    Consulted and Agree with Plan of Care Patient             Patient will benefit from skilled  therapeutic intervention in order to improve the following deficits and impairments:  Abnormal gait, Decreased activity tolerance, Decreased endurance, Decreased balance, Decreased coordination, Decreased strength, Impaired UE functional use  Visit Diagnosis: Unsteadiness on feet  Other abnormalities of gait and mobility  Other lack of coordination     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/30/2021   Acute cerebrovascular accident (CVA) ([place]) 01/20/2021   Acute CVA (cerebrovascular accident) ([place]) 01/20/2021   GERD (gastroesophageal reflux disease)    Coronary artery disease    Depression 01/06/2020   Pneumonia 01/05/2020   History of chest pain 09/26/2018   Dyspnea on exertion 09/08/2018   Chest pain, rule out acute myocardial infarction 10/30/2017   Edema 01/25/2017   Tobacco abuse 06/10/2016   Headache 09/15/2015   OSA (obstructive sleep apnea) 07/30/2015   Nephrolithiasis 07/30/2015   Vasovagal syncope 07/30/2015   Carotid artery stenosis, asymptomatic 07/30/2015   CAD (coronary artery disease) 08/22/2013   Essential hypertension 08/22/2013   Diabetes mellitus without complication ([place])    Dyslipidemia    Seizures ([place])    Generalized convulsive epilepsy ([place]) 03/22/2013   Memory deficit 03/22/2013    Willow Ora, PTA, [facility] [address], [place], [place] [address] [phone] 03/20/21, 10:49 AM    Name: Lilyth Lawyer MRN: [id number] Date of Birth: [birth year]

## 2021-03-23 ENCOUNTER — Other Ambulatory Visit: Payer: Self-pay

## 2021-03-23 ENCOUNTER — Ambulatory Visit: Payer: Medicare HMO

## 2021-03-23 ENCOUNTER — Encounter: Payer: Self-pay | Admitting: Occupational Therapy

## 2021-03-23 ENCOUNTER — Ambulatory Visit: Payer: Medicare HMO | Admitting: Occupational Therapy

## 2021-03-23 ENCOUNTER — Ambulatory Visit: Payer: Medicare HMO | Attending: Family Medicine | Admitting: Physical Therapy

## 2021-03-23 DIAGNOSIS — R2681 Unsteadiness on feet: Secondary | ICD-10-CM | POA: Diagnosis present

## 2021-03-23 DIAGNOSIS — R471 Dysarthria and anarthria: Secondary | ICD-10-CM | POA: Insufficient documentation

## 2021-03-23 DIAGNOSIS — R41840 Attention and concentration deficit: Secondary | ICD-10-CM

## 2021-03-23 DIAGNOSIS — R4701 Aphasia: Secondary | ICD-10-CM | POA: Insufficient documentation

## 2021-03-23 DIAGNOSIS — M6281 Muscle weakness (generalized): Secondary | ICD-10-CM

## 2021-03-23 DIAGNOSIS — R278 Other lack of coordination: Secondary | ICD-10-CM | POA: Diagnosis present

## 2021-03-23 DIAGNOSIS — R41842 Visuospatial deficit: Secondary | ICD-10-CM | POA: Insufficient documentation

## 2021-03-23 DIAGNOSIS — R270 Ataxia, unspecified: Secondary | ICD-10-CM | POA: Diagnosis present

## 2021-03-23 DIAGNOSIS — R41841 Cognitive communication deficit: Secondary | ICD-10-CM | POA: Diagnosis present

## 2021-03-23 DIAGNOSIS — R482 Apraxia: Secondary | ICD-10-CM | POA: Insufficient documentation

## 2021-03-23 DIAGNOSIS — I69318 Other symptoms and signs involving cognitive functions following cerebral infarction: Secondary | ICD-10-CM

## 2021-03-23 DIAGNOSIS — R2689 Other abnormalities of gait and mobility: Secondary | ICD-10-CM | POA: Insufficient documentation

## 2021-03-23 NOTE — Therapy (Signed)
[facility] [address] [place], [place], [address] Phone: [phone]   Fax:  [phone]  Speech Language Pathology Treatment  Patient Details  Name: Samantha Huffman MRN: [id number] Date of Birth: 11-May-1956 Referring Provider (SLP): Lauraine Rinne, [place]   Encounter Date: 03/23/2021   End of Session - 03/23/21 1033     Visit Number 6    Number of Visits 25    Date for SLP Re-Evaluation 05/20/21    SLP Start Time 1100    SLP Stop Time  [id number]    SLP Time Calculation (min) 45 min    Activity Tolerance Patient tolerated treatment well             Past Medical History:  Diagnosis Date   Carotid artery stenosis    1-39% bilateral stenosis by dopplers 08/2020   Coronary artery disease    a. s/p CABG 10/2012.  cath 05/2016 with moderate LM stenosis, mild LCx and RCA stenosis and occluded LAD with patent LIMA to LAD, free radial to OM.  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   Dyslipidemia    Family history of adverse reaction to anesthesia    mother and sister ponv   Generalized convulsive epilepsy without mention of intractable epilepsy    GERD (gastroesophageal reflux disease)    History of kidney stones    Hypertension    Memory deficit    slight   Migraine    "controlled on daily RX " (06/11/2016)   Nephrolithiasis 07/30/2015   S/p lithotripsy x 3 and right and left ureter stents   OSA (obstructive sleep apnea)    cpap    PONV (postoperative nausea and vomiting)    Seizures ([place])    "controlled w/daily RX; started in 2012; dr thinks they might be from the migraines; last sz was early part of 2016" (06/11/2016)   Type II diabetes mellitus ([place])    metformin   Vasovagal syncope 07/30/2015   2011    Past Surgical History:  Procedure Laterality Date   CARDIAC CATHETERIZATION  2014; 06/11/2016   CARDIAC CATHETERIZATION N/A 06/11/2016   Procedure: Left Heart Cath and Cors/Grafts Angiography;  Surgeon:  Sherren Mocha, MD;  Location: [facility];  Service: Cardiovascular;  Laterality: N/A;   CESAREAN SECTION     CORONARY ANGIOPLASTY     CORONARY ARTERY BYPASS GRAFT  March, 2014   LIMA to LAD, left radial to LCx x 2   CYSTOSCOPY W/ URETERAL STENT PLACEMENT Left 01/16/2019   Procedure: CYSTOSCOPY WITH RETROGRADE PYELOGRAM/URETERAL LEFT STENT PLACEMENT;  Surgeon: Ardis Hughs, MD;  Location: [facility];  Service: Urology;  Laterality: Left;   CYSTOSCOPY/URETEROSCOPY/HOLMIUM LASER/STENT PLACEMENT Left 01/24/2019   Procedure: CYSTOSCOPY, LEFT URETEROSCOPY, HOLMIUM LASER, STONE REMOVAL AND STENT EXCHANGE;  Surgeon: Ardis Hughs, MD;  Location: [facility];  Service: Urology;  Laterality: Left;   LITHOTRIPSY  X 3   RIGHT/LEFT HEART CATH AND CORONARY/GRAFT ANGIOGRAPHY N/A 09/08/2018   Procedure: RIGHT/LEFT HEART CATH AND CORONARY/GRAFT ANGIOGRAPHY;  Surgeon: Martinique, Peter M, MD;  Location: [facility];  Service: Cardiovascular;  Laterality: N/A;   TOTAL ABDOMINAL HYSTERECTOMY     ovaries took before hysterectomy   TUBAL LIGATION     URETERAL STENT PLACEMENT      There were no vitals filed for this visit.   Subjective Assessment - 03/23/21 1101     Subjective "I've been walking"    Currently in Pain? No/denies  ADULT SLP TREATMENT - 03/23/21 1032       General Information   Behavior/Cognition Alert;Pleasant mood;Cooperative      Treatment Provided   Treatment provided Cognitive-Linquistic      Cognitive-Linquistic Treatment   Treatment focused on Aphasia;Dysarthria;Cognition    Skilled Treatment Pt exhibited SOB after walking from PT. SLP cued abdominal breathing, which was effective. WNL pitch exhibited throughout session. Pt reports pitch does fluctuate some as pt still occasionally "sounds like a little kid."  Pt did not complete HEP this weekend as pt was busy. SLP engaged patient in conversation re: medication management. Pt verbalized she  has not been forgetting medications as friend had been providing reminders. SLP targeted divergent naming in simple categories, in which pt completed with occasional additional processing time and rare min prompt x1. Pt exhibited anomia x1, in which pt independently utilized description x1 to describe piece of clothing. Semantic paraphasias x1 and phonemic paraphasia x1 noted, in which pt able to correct with rare min A. Pt stated "thinking is hard to do" after completion of naming task. SLP targeted divergent naming for making common recipes at home, in which pt endorsed difficulty recalling all ingredients. SLP recommended patient bring phone that contains recipes to next session to practice naming.      Assessment / Recommendations / Plan   Plan Continue with current plan of care      Progression Toward Goals   Progression toward goals Progressing toward goals              SLP Education - 03/23/21 1420     Education Details HEP    Person(s) Educated Patient    Methods Explanation;Demonstration;Handout    Comprehension Verbalized understanding;Returned demonstration;Need further instruction              SLP Short Term Goals - 03/23/21 1033       SLP SHORT TERM GOAL #1   Title pt will engage in cognitive linguistic and speech strength/coordination testing and goals added as necessary    Status Achieved    Target Date 03/05/21      SLP SHORT TERM GOAL #2   Title using compensations, Samantha Huffman will remind family when she needs to take meds (pt is now dependent upon family) 80% of opportunities between 3 sessions    Time 2    Period Weeks    Status On-going    Target Date 03/20/21      SLP SHORT TERM GOAL #3   Title pt will name 10 items in a simple category with rare verbal cues x3 sessions    Baseline 03-23-21    Time 2    Period Weeks    Status On-going    Target Date 03/20/21      SLP SHORT TERM GOAL #4   Title pt will independently use anomia compensations in 5 minutes  simple conversation x3 sessions    Baseline 03-12-21, 03-23-21    Time 2    Period Weeks    Status On-going    Target Date 03/20/21      SLP SHORT TERM GOAL #5   Title pt will exhibit 100% intelligibility in 5 minutes simple conversation using speech compensations, x3 sessions    Time 2    Period Weeks    Status On-going    Target Date 03/20/21      SLP SHORT TERM GOAL #6   Title Samantha Huffman will complete a speech HEP with rare min A x2 sessions  Time 2    Period Weeks    Status On-going    Target Date 03/20/21      SLP SHORT TERM GOAL #7   Title Pt will verbalize and demonstrate 3 attention/memory strategies to aid functioning home with min A over 2 sessions    Time 2    Period Weeks    Status On-going    Target Date 03/20/21              SLP Long Term Goals - 03/23/21 1034       SLP LONG TERM GOAL #1   Title pt will complete speech HEP independently x3 sessions    Time 6    Period Weeks    Status On-going    Target Date 04/17/21      SLP LONG TERM GOAL #2   Title in 10 minutes simple conversation pt will exhibit functional fluid speech using compensations in 2 sessions    Time 6    Period Weeks    Status On-going    Target Date 04/17/21      SLP LONG TERM GOAL #3   Title in 10 minutes mod complex conversation pt will exhibit functional fluid speech using compensations in 2 sessions    Time 10    Period Weeks    Status On-going    Target Date 05/15/21      SLP LONG TERM GOAL #4   Title pt will complete VNeST and SFA tasks with rare min A in 3 sessions    Time 6    Period Weeks    Status On-going    Target Date 04/17/21      SLP LONG TERM GOAL #5   Title Samantha Huffman will exhibit functional word-finding using compensations in 10 mintues mod complex conversation in 3 sessions    Time 10    Period Weeks    Status On-going    Target Date 05/15/21      SLP LONG TERM GOAL #6   Title Samantha Huffman will independently take meds for 2 weeks as reported by Samantha Huffman or family     Time 10    Period Weeks    Status On-going    Target Date 05/15/21      SLP LONG TERM GOAL #7   Title Pt will verbalize and demonstrate 3 attention/memory strategies to aid functioning at work and home with rare min A over 2 sessions    Time 10    Period Weeks    Status On-going    Target Date 05/15/21              Plan - 03/23/21 1033     Clinical Impression Statement 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 SLP targeted word finding for divergent task, with rare anomia/dysnomia exhibited. Pt benefited from rare min A to correct errors. Change in pitch addressed as pt presented with more normal pitch throughout session. At this time, she is still dependent on family and friends, however they also require training for activities that Samantha Huffman can safely do and to help her progress to more independence. Continue skilled ST to maximize cognition, languae and intelligibility for safety, independence and to reduce caregiver burden.    Speech Therapy Frequency 2x / week    Duration 12 weeks    Treatment/Interventions Language facilitation;Cueing hierarchy;SLP instruction and feedback;Cognitive reorganization;Compensatory strategies;Internal/external aids;Patient/family education;Functional tasks;Environmental controls;Oral motor exercises    Potential to Achieve Goals Good    SLP Home Exercise Plan provided  Consulted and Agree with Plan of Care Patient             Patient will benefit from skilled therapeutic intervention in order to improve the following deficits and impairments:   Aphasia  Cognitive communication deficit    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   Cerebellar stroke ([place]) 01/30/2021   Acute cerebrovascular accident (CVA) ([place]) 01/20/2021   Acute CVA (cerebrovascular accident) ([place]) 01/20/2021   GERD (gastroesophageal reflux disease)    Coronary artery disease    Depression  01/06/2020   Pneumonia 01/05/2020   History of chest pain 09/26/2018   Dyspnea on exertion 09/08/2018   Chest pain, rule out acute myocardial infarction 10/30/2017   Edema 01/25/2017   Tobacco abuse 06/10/2016   Headache 09/15/2015   OSA (obstructive sleep apnea) 07/30/2015   Nephrolithiasis 07/30/2015   Vasovagal syncope 07/30/2015   Carotid artery stenosis, asymptomatic 07/30/2015   CAD (coronary artery disease) 08/22/2013   Essential hypertension 08/22/2013   Diabetes mellitus without complication ([place])    Dyslipidemia    Seizures ([place])    Generalized convulsive epilepsy ([place]) 03/22/2013   Memory deficit 03/22/2013    Alinda Deem, MA CCC-SLP 03/23/2021, 2:23 PM  [facility] [address] [place], [place], [address] Phone: [phone]   Fax:  [phone]   Name: Vali Alderfer MRN: [id number] Date of Birth: 08/17/1956

## 2021-03-23 NOTE — Therapy (Signed)
[facility] [address] [place], [place], [address] Phone: [phone]   Fax:  [phone]  Physical Therapy Treatment  Patient Details  Name: Samantha Huffman MRN: [id number] Date of Birth: November 27, 1955 Referring Provider (PT): Lauraine Rinne, PA-C   Encounter Date: 03/23/2021   PT End of Session - 03/23/21 1947     Visit Number 6    Number of Visits 25    Date for PT Re-Evaluation 05/15/21    Authorization Type Aetna Medicare    Authorization Time Period 02-19-21 - 05-21-21    Progress Note Due on Visit 10    PT Start Time 1020    PT Stop Time 1100    PT Time Calculation (min) 40 min    Equipment Utilized During Treatment Gait belt    Activity Tolerance Patient tolerated treatment well    Behavior During Therapy [facility] for tasks assessed/performed             Past Medical History:  Diagnosis Date   Carotid artery stenosis    1-39% bilateral stenosis by dopplers 08/2020   Coronary artery disease    a. s/p CABG 10/2012.  cath 05/2016 with moderate LM stenosis, mild LCx and RCA stenosis and occluded LAD with patent LIMA to LAD, free radial to OM.  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   Dyslipidemia    Family history of adverse reaction to anesthesia    mother and sister ponv   Generalized convulsive epilepsy without mention of intractable epilepsy    GERD (gastroesophageal reflux disease)    History of kidney stones    Hypertension    Memory deficit    slight   Migraine    "controlled on daily RX " (06/11/2016)   Nephrolithiasis 07/30/2015   S/p lithotripsy x 3 and right and left ureter stents   OSA (obstructive sleep apnea)    cpap    PONV (postoperative nausea and vomiting)    Seizures ([place])    "controlled w/daily RX; started in 2012; dr thinks they might be from the migraines; last sz was early part of 2016" (06/11/2016)   Type II diabetes mellitus ([place])    metformin   Vasovagal syncope 07/30/2015    2011    Past Surgical History:  Procedure Laterality Date   CARDIAC CATHETERIZATION  2014; 06/11/2016   CARDIAC CATHETERIZATION N/A 06/11/2016   Procedure: Left Heart Cath and Cors/Grafts Angiography;  Surgeon: Sherren Mocha, MD;  Location: [facility];  Service: Cardiovascular;  Laterality: N/A;   CESAREAN SECTION     CORONARY ANGIOPLASTY     CORONARY ARTERY BYPASS GRAFT  March, 2014   LIMA to LAD, left radial to LCx x 2   CYSTOSCOPY W/ URETERAL STENT PLACEMENT Left 01/16/2019   Procedure: CYSTOSCOPY WITH RETROGRADE PYELOGRAM/URETERAL LEFT STENT PLACEMENT;  Surgeon: Ardis Hughs, MD;  Location: [facility];  Service: Urology;  Laterality: Left;   CYSTOSCOPY/URETEROSCOPY/HOLMIUM LASER/STENT PLACEMENT Left 01/24/2019   Procedure: CYSTOSCOPY, LEFT URETEROSCOPY, HOLMIUM LASER, STONE REMOVAL AND STENT EXCHANGE;  Surgeon: Ardis Hughs, MD;  Location[facility];  Service: Urology;  Laterality: Left;   LITHOTRIPSY  X 3   RIGHT/LEFT HEART CATH AND CORONARY/GRAFT ANGIOGRAPHY N/A 09/08/2018   Procedure: RIGHT/LEFT HEART CATH AND CORONARY/GRAFT ANGIOGRAPHY;  Surgeon: Martinique, Peter M, MD;  Location: [facility];  Service: Cardiovascular;  Laterality: N/A;   TOTAL ABDOMINAL HYSTERECTOMY     ovaries took before hysterectomy   TUBAL LIGATION  URETERAL STENT PLACEMENT      There were no vitals filed for this visit.   Subjective Assessment - 03/23/21 1020     Subjective Pt reports she is using RW with tray in the house and is able to carry things independently; also is using RW when she goes to the store - is able to sit when she gets tired    Pertinent History Carotid artery stenosis, HTN, epilepsy with seizure disorder - last seizure was in early 2016:  migraines, DM, memory deficit, CAD,    Currently in Pain? No/denies                               [facility] Adult PT Treatment/Exercise - 03/23/21 0001       Transfers   Transfers Sit to Stand;Stand to Sit     Sit to Stand 4: Min guard    Stand to Sit 5: Supervision    Number of Reps 10 reps    Comments 5 reps feet on floor:  5 reps feet on blue mat - min to CGA needed for stabilizing on this surface      Ambulation/Gait   Ambulation/Gait Yes    Ambulation/Gait Assistance 4: Min guard    Ambulation Distance (Feet) 230 Feet    Assistive device None    Gait Pattern Step-through pattern;Ataxic    Ambulation Surface Level;Indoor    Stairs Yes    Stairs Assistance 5: Supervision    Stair Management Technique Two rails;Step to pattern;Alternating pattern;Forwards    Number of Stairs 4   4 reps - 3 reps step over step sequence   Height of Stairs 6      High Level Balance   High Level Balance Activities Side stepping;Backward walking;Turns;Sudden stops;Head turns;Tandem walking            NeuroRe-ed:   partial tandem gait 20' x 2 reps with min assist  Pt performed abrupt stop and turn 4 reps inside // bars with SBA; performed 2 reps outside of // bars - amb. 20' x 2 reps  with stop and turn -no device used - CGA     Balance Exercises - 03/23/21 0001       Balance Exercises: Standing   Standing Eyes Closed Wide (BOA);Solid surface;1 rep;10 secs    Stepping Strategy Anterior;Posterior;Lateral;5 reps   with UE support prn on // bars   Gait with Head Turns Forward;2 reps   25' x 2 reps   Sidestepping 2 reps   inside // bars with UE support   Step Over Hurdles / Cones stepping over/ back of black balance beam with UE support prn on // bar - 10 reps each leg    Marching Solid surface;Static;10 reps   no UE support used   Other Standing Exercises Comments Pt performed cone taps to 3 cones with min assist for balance recovery - 5 reps each leg                 PT Short Term Goals - 03/23/21 1024       PT SHORT TERM GOAL #1   Title Improve Berg score from 34/56 to >/= 39/56 for reduced fall risk.    Baseline 03/18/21: 50/56 scored today    Status Achieved      PT SHORT TERM  GOAL #2   Title Improve TUG score from 17.32 secs to </= 14.5 secs without use of device to  increase safety with ambulation.    Baseline 03/18/21: 14.41 secs with RW, 15.18 sec's no AD, met with RW not no AD    Status Partially Met    Target Date 03/20/21      PT SHORT TERM GOAL #3   Title Increase gait velocity from 2.25 ft/sec without device to >/= 2.6 ft/sec with CGA for incr. gait efficiency.    Baseline 27/27/22: 2.41 ft/sec with RW with supervision, 2.65 ft/sec no AD with min guard assist, met with no AD    Status Achieved      PT SHORT TERM GOAL #4   Title Assess DGI and establish goal as appropriate.    Baseline 03/20/21: 13/24 scored today as baseline, baseline added to LTG    Status Achieved    Target Date 03/20/21      PT SHORT TERM GOAL #5   Title Pt wil amb. 350' with CGA on flat, even surface without LOB.    Baseline 03/18/21: met in session with RW    Status Achieved      PT SHORT TERM GOAL #6   Title Independent in HEP for balance and strengthening exercises.    Baseline 03/18/21: met with current HEP per pt report    Status Achieved    Target Date 03/20/21               PT Long Term Goals - 03/23/21 1955       PT LONG TERM GOAL #1   Title Improve Berg score from 34/56 to >/= 50/56 to reduce fall risk.. (all LTGs due 05/22/21)    Baseline 34/56 on 02-19-21    Time 12    Period Weeks    Status On-going      PT LONG TERM GOAL #2   Title Pt will ambulate 1000' modified independently without device on even and uneven surfaces.    Time 12    Period Weeks    Status On-going      PT LONG TERM GOAL #3   Title Improve TUG score from 17.32 secs to </= 13 secs to reduce fall risk.    Baseline 17.32 secs on 02-19-21 with CGA    Time 12    Period Weeks    Status On-going      PT LONG TERM GOAL #4   Title Improve DGI by at least 10 points to increase safety with gait.    Baseline 03/20/21: 13/24    Time 12    Period Weeks    Status On-going      PT LONG TERM  GOAL #5   Title Independent in updated HEP as appropriate.    Time 12    Period Weeks    Status On-going      PT LONG TERM GOAL #6   Title Increase LE FOTO score by at least 10 points to demo improvement in functional status.    Baseline 59/100 on 02-19-21    Time 12    Period Weeks    Status On-going                   Plan - 03/23/21 1951     Clinical Impression Statement Pt is improving with gait and balance; able to perform dynamic gait activiity of walking/tossing ball with CGA without major LOB.  Pt continues to use RW for assistance with ambulation, but all gait training in PT was performed without use of device.  Pt continues to need cues to increase  arm swing and to increase step length.  Pt's RLE remains slightly weaker than LLE. Cont with POC.    Personal Factors and Comorbidities Comorbidity 2;Past/Current Experience;Transportation    Comorbidities migraines, epilepsy with seizure disorder, DM, CAD, s/p cataract sx on bil. eyes, memory deficit    Examination-Activity Limitations Bathing;Stand;Transfers;Carry;Squat;Stairs;Locomotion Level;Hygiene/Grooming;Bend    Examination-Participation Restrictions Cleaning;Community Activity;Driving;Laundry;Shop;Meal Prep    Stability/Clinical Decision Making Evolving/Moderate complexity    Rehab Potential Good    PT Frequency 2x / week    PT Duration 12 weeks    PT Treatment/Interventions ADLs/Self Care Home Management;Aquatic Therapy;DME Instruction;Gait training;Stair training;Therapeutic activities;Therapeutic exercise;Balance training;Neuromuscular re-education;Patient/family education    PT Next Visit Plan Cont gait training without RW, balance and coordination exercises; RLE strengthening    PT Home Exercise Plan balance HEP issued on 03-12-21    Consulted and Agree with Plan of Care Patient             Patient will benefit from skilled therapeutic intervention in order to improve the following deficits and  impairments:  Abnormal gait, Decreased activity tolerance, Decreased endurance, Decreased balance, Decreased coordination, Decreased strength, Impaired UE functional use  Visit Diagnosis: Other abnormalities of gait and mobility  Muscle weakness (generalized)  Unsteadiness on feet     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[place]) 03/09/2021   Cerebellar stroke ([place]) 01/30/2021   Acute cerebrovascular accident (CVA) ([place]) 01/20/2021   Acute CVA (cerebrovascular accident) ([place]) 01/20/2021   GERD (gastroesophageal reflux disease)    Coronary artery disease    Depression 01/06/2020   Pneumonia 01/05/2020   History of chest pain 09/26/2018   Dyspnea on exertion 09/08/2018   Chest pain, rule out acute myocardial infarction 10/30/2017   Edema 01/25/2017   Tobacco abuse 06/10/2016   Headache 09/15/2015   OSA (obstructive sleep apnea) 07/30/2015   Nephrolithiasis 07/30/2015   Vasovagal syncope 07/30/2015   Carotid artery stenosis, asymptomatic 07/30/2015   CAD (coronary artery disease) 08/22/2013   Essential hypertension 08/22/2013   Diabetes mellitus without complication ([place])    Dyslipidemia    Seizures ([place])    Generalized convulsive epilepsy ([place]) 03/22/2013   Memory deficit 03/22/2013    Alda Lea, PT 03/23/2021, 7:59 PM  [facility] [address] [place], [place], [address] Phone: [phone]   Fax:  [phone]  Name: Eddis Pingleton MRN: [id number] Date of Birth: 08/12/1956

## 2021-03-23 NOTE — Patient Instructions (Addendum)
  Bring your phone to next speech therapy session so we can work on naming and remembering the items in your favorite recipes.   Complete homework to name 10 items in each category. You don't have to do all the categories at once.

## 2021-03-23 NOTE — Therapy (Signed)
[facility] [address] [place], [place], [address] Phone: [phone]   Fax:  [phone]  Occupational Therapy Treatment  Patient Details  Name: Samantha Huffman MRN: [id number] Date of Birth: [birth year] Referring Provider (OT): Danella Sensing, NP   Encounter Date: 03/23/2021   OT End of Session - 03/23/21 0935     Visit Number 6    Number of Visits 25    Date for OT Re-Evaluation 06/08/21    Authorization Type Aetna Medicare (recert completed [id number] due to delay in start of OT due to schedule conflicts)    Authorization - Visit Number 6    Authorization - Number of Visits --    Progress Note Due on Visit 10    OT Start Time 0930    OT Stop Time 1012    OT Time Calculation (min) 42 min    Activity Tolerance Patient tolerated treatment well    Behavior During Therapy [facility] for tasks assessed/performed             Past Medical History:  Diagnosis Date   Carotid artery stenosis    1-39% bilateral stenosis by dopplers 08/2020   Coronary artery disease    a. s/p CABG 10/2012.  cath 05/2016 with moderate LM stenosis, mild LCx and RCA stenosis and occluded LAD with patent LIMA to LAD, free radial to OM.  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   Dyslipidemia    Family history of adverse reaction to anesthesia    mother and sister ponv   Generalized convulsive epilepsy without mention of intractable epilepsy    GERD (gastroesophageal reflux disease)    History of kidney stones    Hypertension    Memory deficit    slight   Migraine    "controlled on daily RX " (06/11/2016)   Nephrolithiasis 07/30/2015   S/p lithotripsy x 3 and right and left ureter stents   OSA (obstructive sleep apnea)    cpap    PONV (postoperative nausea and vomiting)    Seizures ([place])    "controlled w/daily RX; started in 2012; dr thinks they might be from the migraines; last sz was early part of 2016" (06/11/2016)   Type II diabetes  mellitus ([place])    metformin   Vasovagal syncope 07/30/2015   2011    Past Surgical History:  Procedure Laterality Date   CARDIAC CATHETERIZATION  2014; 06/11/2016   CARDIAC CATHETERIZATION N/A 06/11/2016   Procedure: Left Heart Cath and Cors/Grafts Angiography;  Surgeon: Sherren Mocha, MD;  Location: [facility];  Service: Cardiovascular;  Laterality: N/A;   CESAREAN SECTION     CORONARY ANGIOPLASTY     CORONARY ARTERY BYPASS GRAFT  March, 2014   LIMA to LAD, left radial to LCx x 2   CYSTOSCOPY W/ URETERAL STENT PLACEMENT Left 01/16/2019   Procedure: CYSTOSCOPY WITH RETROGRADE PYELOGRAM/URETERAL LEFT STENT PLACEMENT;  Surgeon: Ardis Hughs, MD;  [facility];  Service: Urology;  Laterality: Left;   CYSTOSCOPY/URETEROSCOPY/HOLMIUM LASER/STENT PLACEMENT Left 01/24/2019   Procedure: CYSTOSCOPY, LEFT URETEROSCOPY, HOLMIUM LASER, STONE REMOVAL AND STENT EXCHANGE;  Surgeon: Ardis Hughs, MD;  [facility];  Service: Urology;  Laterality: Left;   LITHOTRIPSY  X 3   RIGHT/LEFT HEART CATH AND CORONARY/GRAFT ANGIOGRAPHY N/A 09/08/2018   Procedure: RIGHT/LEFT HEART CATH AND CORONARY/GRAFT ANGIOGRAPHY;  Surgeon: Martinique, Peter M, MD;  Location: [facility];  Service: Cardiovascular;  Laterality: N/A;   TOTAL ABDOMINAL HYSTERECTOMY  ovaries took before hysterectomy   TUBAL LIGATION     URETERAL STENT PLACEMENT      There were no vitals filed for this visit.   Subjective Assessment - 03/23/21 0933     Subjective  Pt states she started wearing a cardiac event monitor last week; will wear this for 30 days    Pertinent History CVA with R-sided weakness/ataxia.  PMH:  CAD, HTN, DM, OSA, seizure disorder, dyslipidemia, migraine, CABG (2014), recent bilateral cataract surgeries just prior to CVA    Patient Stated Goals return to driving, return to living alone, cook    Currently in Pain? No/denies             Treatment/Exercises - 03/23/21    Mini Pegboard   Copying pattern onto pegboard w/ mini pegs to facilitate [place], alternating attention, intrinsic hand strengthening, and visual perception; pt able to complete activity w/ 3 v/c for spatial orientation/multiple drops. To remove, retrieved 3 at a time and translated each to fingertips to return to container.    Blocks Stacking 1" blocks and then tapping tower slowly/gently to avoid knocking it over for coordination; decreased speed of movement     [place] game played, including pushing blocks out of tower and stacking on top w/ RUE to facilitate FMC/coordination, force gradation, eye-hand coordination, and bilateral integration. Pt experienced mild difficulty w/ problem-solving how to readjust RUE to efficiently push/retrieve blocks, requiring min cues for repositioning             OT Short Term Goals - 03/20/21 0828       OT SHORT TERM GOAL #1   Title Pt will be independent with initial HEP.--Check initial STGs (#1-4)  04/09/21    Time 4    Period Weeks    Status On-going      OT SHORT TERM GOAL #2   Title Pt will demo improved coordination and functional reach for ADLs as shown by improving score on box and blocks test by at least 6.    Baseline 42 blocks    Time 4    Period Weeks    Status On-going      OT SHORT TERM GOAL #3   Title Pt will perform bathing with min A.    Time 4    Period Weeks    Status On-going      OT SHORT TERM GOAL #4   Title Pt will perofrm UB dressing mod I.    Time 4    Period Weeks    Status Achieved      OT SHORT TERM GOAL #5   Title Pt will perform LB dressing with min A.--check STGs #5-8  05/10/21    Time 8    Period Weeks    Status Achieved      OT SHORT TERM GOAL #6   Title Pt will perform bathing with supervision.    Time 8    Period Weeks    Status New      OT SHORT TERM GOAL #7   Title Pt will peform simple home maintenane task and snack prep mod I.    Time 8    Period Weeks    Status New      OT SHORT TERM GOAL #8   Title Pt  will cut food mod I using AE prn.    Time 8    Period Weeks    Status New  OT Long Term Goals - 03/10/21 1519       OT LONG TERM GOAL #1   Title Pt will be independent with updated HEP--check LTGs 06/08/21    Time 12    Period Weeks    Status New      OT LONG TERM GOAL #2   Title Pt will perform BADLs mod I with AE/DME and strategies prn.    Time 12    Period Weeks    Status New      OT LONG TERM GOAL #3   Title Pt wil be for memory/cognitive compensation strategies for ADL/IADLs prn.    Time 12    Period Weeks    Status New      OT LONG TERM GOAL #4   Title Pt will perform simple cooking task with supervision.    Time 12    Period Weeks    Status New      OT LONG TERM GOAL #5   Title Pt will perform environmental scanning/navigation with at least 90% accuracy (with head turns) without reports of dizziness/LOB.    Time 12    Period Weeks    Status New      OT LONG TERM GOAL #6   Title Pt will improve RUE coordination as shown by completing 9-hole peg test in less than 36sec without drops.    Time 12    Period Weeks    Status New             Plan - 03/23/21 0956     Clinical Impression Statement Pt demo'd good coordination this session, able to remove and place blocks on stacks slightly above chest height w/ min difficulty and less than 4 drops during pegboard activity. OT also reviewed tray for RW w/ pt verbalizing good understanding of learned strategies.    OT Occupational Profile and History Detailed Assessment- Review of Records and additional review of physical, cognitive, psychosocial history related to current functional performance    Occupational performance deficits (Please refer to evaluation for details): ADL's;IADL's;Leisure;Social Participation    Body Structure / Function / Physical Skills ADL;Decreased knowledge of use of DME;Strength;GMC;Dexterity;Balance;UE functional use;IADL;Endurance;ROM;Vision;Improper spinal/pelvic  alignment;Coordination;Mobility;FMC    Cognitive Skills Attention;Memory;Understand    Rehab Potential Good    Clinical Decision Making Several treatment options, min-mod task modification necessary    Comorbidities Affecting Occupational Performance: May have comorbidities impacting occupational performance    Modification or Assistance to Complete Evaluation  Min-Moderate modification of tasks or assist with assess necessary to complete eval    OT Frequency 2x / week    OT Duration 12 weeks   +eval   OT Treatment/Interventions Self-care/ADL training;Moist Heat;DME and/or AE instruction;Balance training;Aquatic Therapy;Therapeutic activities;Cognitive remediation/compensation;Therapeutic exercise;Cryotherapy;Neuromuscular education;Functional Mobility Training;Passive range of motion;Visual/perceptual remediation/compensation;Patient/family education;Manual Therapy;Energy conservation;Paraffin;Fluidtherapy    Plan wt. bearing/scapular stabilization HEP, fine motor coordination/functional use of UE's, ?re-assess B&B (STG2)    Consulted and Agree with Plan of Care Patient;Family member/caregiver    Family Member Consulted dtr             Patient will benefit from skilled therapeutic intervention in order to improve the following deficits and impairments:   Body Structure / Function / Physical Skills: ADL, Decreased knowledge of use of DME, Strength, GMC, Dexterity, Balance, UE functional use, IADL, Endurance, ROM, Vision, Improper spinal/pelvic alignment, Coordination, Mobility, [facility] Cognitive Skills: Attention, Memory, Understand   Visit Diagnosis: Other lack of coordination  Ataxia  Visuospatial deficit  Attention and concentration deficit  Other symptoms and signs involving cognitive functions following cerebral infarction    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/2021   Cerebellar stroke ([place]) 01/30/2021   Acute  cerebrovascular accident (CVA) ([place]) 01/20/2021   Acute CVA (cerebrovascular accident) ([place]) 01/20/2021   GERD (gastroesophageal reflux disease)    Coronary artery disease    Depression 01/06/2020   Pneumonia 01/05/2020   History of chest pain 09/26/2018   Dyspnea on exertion 09/08/2018   Chest pain, rule out acute myocardial infarction 10/30/2017   Edema 01/25/2017   Tobacco abuse 06/10/2016   Headache 09/15/2015   OSA (obstructive sleep apnea) 07/30/2015   Nephrolithiasis 07/30/2015   Vasovagal syncope 07/30/2015   Carotid artery stenosis, asymptomatic 07/30/2015   CAD (coronary artery disease) 08/22/2013   Essential hypertension 08/22/2013   Diabetes mellitus without complication ([place])    Dyslipidemia    Seizures ([place])    Generalized convulsive epilepsy ([place]) 03/22/2013   Memory deficit 03/22/2013     Kathrine Cords, OTR/L, MSOT  03/23/2021, 10:04 AM  [facility] [address] [place], [place], [address] Phone: [phone]   Fax:  [phone]  Name: Zakiyah Dasari MRN: [id number] Date of Birth: 1956-03-20

## 2021-03-25 ENCOUNTER — Ambulatory Visit: Payer: Medicare HMO | Admitting: Physical Therapy

## 2021-03-25 ENCOUNTER — Ambulatory Visit: Payer: Medicare HMO

## 2021-03-25 ENCOUNTER — Other Ambulatory Visit: Payer: Self-pay

## 2021-03-25 ENCOUNTER — Encounter: Payer: Self-pay | Admitting: Physical Therapy

## 2021-03-25 ENCOUNTER — Ambulatory Visit: Payer: Medicare HMO | Admitting: Occupational Therapy

## 2021-03-25 DIAGNOSIS — R2689 Other abnormalities of gait and mobility: Secondary | ICD-10-CM

## 2021-03-25 DIAGNOSIS — R4701 Aphasia: Secondary | ICD-10-CM

## 2021-03-25 DIAGNOSIS — R278 Other lack of coordination: Secondary | ICD-10-CM

## 2021-03-25 DIAGNOSIS — R41841 Cognitive communication deficit: Secondary | ICD-10-CM

## 2021-03-25 DIAGNOSIS — R2681 Unsteadiness on feet: Secondary | ICD-10-CM

## 2021-03-25 DIAGNOSIS — I69318 Other symptoms and signs involving cognitive functions following cerebral infarction: Secondary | ICD-10-CM

## 2021-03-25 DIAGNOSIS — M6281 Muscle weakness (generalized): Secondary | ICD-10-CM

## 2021-03-25 DIAGNOSIS — R41842 Visuospatial deficit: Secondary | ICD-10-CM

## 2021-03-25 DIAGNOSIS — R41840 Attention and concentration deficit: Secondary | ICD-10-CM

## 2021-03-25 DIAGNOSIS — R471 Dysarthria and anarthria: Secondary | ICD-10-CM

## 2021-03-25 NOTE — Therapy (Signed)
[facility] [address] [place], [place], [address] Phone: [phone]   Fax:  [phone]  Physical Therapy Treatment  Patient Details  Name: Samantha Huffman MRN: [id number] Date of Birth: Jul 31, 1956 Referring Provider (PT): Lauraine Rinne, PA-C   Encounter Date: 03/25/2021   PT End of Session - 03/25/21 0937     Visit Number 7    Number of Visits 25    Date for PT Re-Evaluation 05/15/21    Authorization Type [employer]    Authorization Time Period 02-19-21 - 05-21-21    Progress Note Due on Visit 10    PT Start Time 0933    PT Stop Time 1015    PT Time Calculation (min) 42 min    Equipment Utilized During Treatment Gait belt    Activity Tolerance Patient tolerated treatment well    Behavior During Therapy [facility] for tasks assessed/performed             Past Medical History:  Diagnosis Date   Carotid artery stenosis    1-39% bilateral stenosis by dopplers 08/2020   Coronary artery disease    a. s/p CABG 10/2012.  cath 05/2016 with moderate LM stenosis, mild LCx and RCA stenosis and occluded LAD with patent LIMA to LAD, free radial to OM.  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   Dyslipidemia    Family history of adverse reaction to anesthesia    mother and sister ponv   Generalized convulsive epilepsy without mention of intractable epilepsy    GERD (gastroesophageal reflux disease)    History of kidney stones    Hypertension    Memory deficit    slight   Migraine    "controlled on daily RX " (06/11/2016)   Nephrolithiasis 07/30/2015   S/p lithotripsy x 3 and right and left ureter stents   OSA (obstructive sleep apnea)    cpap    PONV (postoperative nausea and vomiting)    Seizures ([place])    "controlled w/daily RX; started in 2012; dr thinks they might be from the migraines; last sz was early part of 2016" (06/11/2016)   Type II diabetes mellitus ([place])    metformin   Vasovagal syncope 07/30/2015    2011    Past Surgical History:  Procedure Laterality Date   CARDIAC CATHETERIZATION  2014; 06/11/2016   CARDIAC CATHETERIZATION N/A 06/11/2016   Procedure: Left Heart Cath and Cors/Grafts Angiography;  Surgeon: Sherren Mocha, MD;  Location: [facility];  Service: Cardiovascular;  Laterality: N/A;   CESAREAN SECTION     CORONARY ANGIOPLASTY     CORONARY ARTERY BYPASS GRAFT  March, 2014   LIMA to LAD, left radial to LCx x 2   CYSTOSCOPY W/ URETERAL STENT PLACEMENT Left 01/16/2019   Procedure: CYSTOSCOPY WITH RETROGRADE PYELOGRAM/URETERAL LEFT STENT PLACEMENT;  Surgeon: Ardis Hughs, MD;  Location: [facility];  Service: Urology;  Laterality: Left;   CYSTOSCOPY/URETEROSCOPY/HOLMIUM LASER/STENT PLACEMENT Left 01/24/2019   Procedure: CYSTOSCOPY, LEFT URETEROSCOPY, HOLMIUM LASER, STONE REMOVAL AND STENT EXCHANGE;  Surgeon: Ardis Hughs, MD;  Location: [facility];  Service: Urology;  Laterality: Left;   LITHOTRIPSY  X 3   RIGHT/LEFT HEART CATH AND CORONARY/GRAFT ANGIOGRAPHY N/A 09/08/2018   Procedure: RIGHT/LEFT HEART CATH AND CORONARY/GRAFT ANGIOGRAPHY;  Surgeon: Martinique, Peter M, MD;  Location: [facility];  Service: Cardiovascular;  Laterality: N/A;   TOTAL ABDOMINAL HYSTERECTOMY     ovaries took before hysterectomy   TUBAL LIGATION  URETERAL STENT PLACEMENT      There were no vitals filed for this visit.   Subjective Assessment - 03/25/21 0937     Subjective No new complaints No falls or pain to report. HEP is going well.    Pertinent History Carotid artery stenosis, HTN, epilepsy with seizure disorder - last seizure was in early 2016:  migraines, DM, memory deficit, CAD,    Diagnostic tests CT of head and neck:  MRA    Patient Stated Goals Improve balance and walking    Pain Score 0-No pain                   [facility] Adult PT Treatment/Exercise - 03/25/21 0938       Transfers   Transfers Sit to Stand;Stand to Sit    Sit to Stand 4: Min guard;With upper  extremity assist;From bed;From chair/3-in-1    Stand to Sit 5: Supervision;With upper extremity assist;To bed;To chair/3-in-1      Ambulation/Gait   Ambulation/Gait Yes    Ambulation/Gait Assistance 4: Min guard    Ambulation Distance (Feet) --   around clinic with session   Assistive device None    Gait Pattern Step-through pattern;Ataxic    Ambulation Surface Level;Indoor      High Level Balance   High Level Balance Activities Negotitating around obstacles;Negotiating over obstacles    High Level Balance Comments forward stepping over 3 bolsters<>weaving around 3 cones for 4 laps forward with no AD, min guard to min assist for balance.      Neuro Re-ed    Neuro Re-ed Details  for strengthening/NMR: gait around track working on scanning all directions, speed changes, sudden stops/starts with min guard assist, no balance loss noted;                 Balance Exercises - 03/25/21 0949       Balance Exercises: Standing   SLS with Vectors Foam/compliant surface;Intermittent upper extremity assist;Other reps (comment);Limitations    SLS with Vectors Limitations standing on balance board in ant/posterior direction with 2 tall cones on floor in front of board: alternating forward, then cross foot taps ofr ~10 reps each with min guard to min assist, light touch to bars for balance at times with cues on maintaining wider stance and on weight shifting.    Rockerboard Anterior/posterior;Lateral;EO;EC;30 seconds;Other reps (comment);Limitations    Rockerboard Limitations performed both ways on balance board with no UE support, occasional touch to bars- rocking the board with EO, progressing to EC, then holding the board steady for EC 30 sec's x 3 reps.  min guard to min assist with cues on weight shifting/posture to assist with balance.                 PT Short Term Goals - 03/23/21 1024       PT SHORT TERM GOAL #1   Title Improve Berg score from 34/56 to >/= 39/56 for reduced  fall risk.    Baseline 03/18/21: 50/56 scored today    Status Achieved      PT SHORT TERM GOAL #2   Title Improve TUG score from 17.32 secs to </= 14.5 secs without use of device to increase safety with ambulation.    Baseline 03/18/21: 14.41 secs with RW, 15.18 sec's no AD, met with RW not no AD    Status Partially Met    Target Date 03/20/21      PT SHORT TERM GOAL #3   Title Increase gait  velocity from 2.25 ft/sec without device to >/= 2.6 ft/sec with CGA for incr. gait efficiency.    Baseline 27/27/22: 2.41 ft/sec with RW with supervision, 2.65 ft/sec no AD with min guard assist, met with no AD    Status Achieved      PT SHORT TERM GOAL #4   Title Assess DGI and establish goal as appropriate.    Baseline 03/20/21: 13/24 scored today as baseline, baseline added to LTG    Status Achieved    Target Date 03/20/21      PT SHORT TERM GOAL #5   Title Pt wil amb. 350' with CGA on flat, even surface without LOB.    Baseline 03/18/21: met in session with RW    Status Achieved      PT SHORT TERM GOAL #6   Title Independent in HEP for balance and strengthening exercises.    Baseline 03/18/21: met with current HEP per pt report    Status Achieved    Target Date 03/20/21               PT Long Term Goals - 03/23/21 1955       PT LONG TERM GOAL #1   Title Improve Berg score from 34/56 to >/= 50/56 to reduce fall risk.. (all LTGs due 05/22/21)    Baseline 34/56 on 02-19-21    Time 12    Period Weeks    Status On-going      PT LONG TERM GOAL #2   Title Pt will ambulate 1000' modified independently without device on even and uneven surfaces.    Time 12    Period Weeks    Status On-going      PT LONG TERM GOAL #3   Title Improve TUG score from 17.32 secs to </= 13 secs to reduce fall risk.    Baseline 17.32 secs on 02-19-21 with CGA    Time 12    Period Weeks    Status On-going      PT LONG TERM GOAL #4   Title Improve DGI by at least 10 points to increase safety with gait.     Baseline 03/20/21: 13/24    Time 12    Period Weeks    Status On-going      PT LONG TERM GOAL #5   Title Independent in updated HEP as appropriate.    Time 12    Period Weeks    Status On-going      PT LONG TERM GOAL #6   Title Increase LE FOTO score by at least 10 points to demo improvement in functional status.    Baseline 59/100 on 02-19-21    Time 12    Period Weeks    Status On-going                   Plan - 03/25/21 6599     Clinical Impression Statement Today's skilled session continued to focus on gait and balance with no AD with no issues noted or reported in session. The pt is making steady progress and should benefit from continued PT to progress toward unmet goals.    Personal Factors and Comorbidities Comorbidity 2;Past/Current Experience;Transportation    Comorbidities migraines, epilepsy with seizure disorder, DM, CAD, s/p cataract sx on bil. eyes, memory deficit    Examination-Activity Limitations Bathing;Stand;Transfers;Carry;Squat;Stairs;Locomotion Level;Hygiene/Grooming;Bend    Examination-Participation Restrictions Cleaning;Community Activity;Driving;Laundry;Shop;Meal Prep    Stability/Clinical Decision Making Evolving/Moderate complexity    Rehab Potential Good    PT Frequency  2x / week    PT Duration 12 weeks    PT Treatment/Interventions ADLs/Self Care Home Management;Aquatic Therapy;DME Instruction;Gait training;Stair training;Therapeutic activities;Therapeutic exercise;Balance training;Neuromuscular re-education;Patient/family education    PT Next Visit Plan Cont gait training without RW, balance and coordination exercises; RLE strengthening    PT Home Exercise Plan balance HEP issued on 03-12-21    Consulted and Agree with Plan of Care Patient             Patient will benefit from skilled therapeutic intervention in order to improve the following deficits and impairments:  Abnormal gait, Decreased activity tolerance, Decreased endurance,  Decreased balance, Decreased coordination, Decreased strength, Impaired UE functional use  Visit Diagnosis: Other abnormalities of gait and mobility  Muscle weakness (generalized)  Unsteadiness on feet     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[place]) 03/09/2021   Cerebellar stroke ([place]) 01/30/2021   Acute cerebrovascular accident (CVA) ([place]) 01/20/2021   Acute CVA (cerebrovascular accident) ([place]) 01/20/2021   GERD (gastroesophageal reflux disease)    Coronary artery disease    Depression 01/06/2020   Pneumonia 01/05/2020   History of chest pain 09/26/2018   Dyspnea on exertion 09/08/2018   Chest pain, rule out acute myocardial infarction 10/30/2017   Edema 01/25/2017   Tobacco abuse 06/10/2016   Headache 09/15/2015   OSA (obstructive sleep apnea) 07/30/2015   Nephrolithiasis 07/30/2015   Vasovagal syncope 07/30/2015   Carotid artery stenosis, asymptomatic 07/30/2015   CAD (coronary artery disease) 08/22/2013   Essential hypertension 08/22/2013   Diabetes mellitus without complication ([place])    Dyslipidemia    Seizures ([place])    Generalized convulsive epilepsy ([place]) 03/22/2013   Memory deficit 03/22/2013    Willow Ora, PTA, [facility] [address] [place], [place] [address] [phone] 03/26/21, 9:24 AM   Name: Huda Petrey MRN: [id number] Date of Birth: 24-Jun-1956

## 2021-03-25 NOTE — Therapy (Signed)
[facility] [address] [place], [place], [address] Phone: [phone]   Fax:  [phone]  Speech Language Pathology Treatment  Patient Details  Name: Samantha Huffman MRN: [id number] Date of Birth: 07/20/1956 Referring Provider (SLP): Angiulli, Daniel, PA   Encounter Date: 03/25/2021   End of Session - 03/25/21 1718     Visit Number 7    Number of Visits 25    Date for SLP Re-Evaluation 05/20/21    SLP Start Time 1103    SLP Stop Time  1145    SLP Time Calculation (min) 42 min    Activity Tolerance Patient tolerated treatment well             Past Medical History:  Diagnosis Date   Carotid artery stenosis    1-39% bilateral stenosis by dopplers 08/2020   Coronary artery disease    a. s/p CABG 10/2012.  cath 05/2016 with moderate LM stenosis, mild LCx and RCA stenosis and occluded LAD with patent LIMA to LAD, free radial to OM.  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   Dyslipidemia    Family history of adverse reaction to anesthesia    mother and sister ponv   Generalized convulsive epilepsy without mention of intractable epilepsy    GERD (gastroesophageal reflux disease)    History of kidney stones    Hypertension    Memory deficit    slight   Migraine    "controlled on daily RX " (06/11/2016)   Nephrolithiasis 07/30/2015   S/p lithotripsy x 3 and right and left ureter stents   OSA (obstructive sleep apnea)    cpap    PONV (postoperative nausea and vomiting)    Seizures ([facility])    "controlled w/daily RX; started in 2012; dr thinks they might be from the migraines; last sz was early part of 2016" (06/11/2016)   Type II diabetes mellitus ([facility])    metformin   Vasovagal syncope 07/30/2015   2011    Past Surgical History:  Procedure Laterality Date   CARDIAC CATHETERIZATION  2014; 06/11/2016   CARDIAC CATHETERIZATION N/A 06/11/2016   Procedure: Left Heart Cath and Cors/Grafts Angiography;  Surgeon:  Michael Cooper, MD;  [facility];  Service: Cardiovascular;  Laterality: N/A;   CESAREAN SECTION     CORONARY ANGIOPLASTY     CORONARY ARTERY BYPASS GRAFT  March, 2014   LIMA to LAD, left radial to LCx x 2   CYSTOSCOPY W/ URETERAL STENT PLACEMENT Left 01/16/2019   Procedure: CYSTOSCOPY WITH RETROGRADE PYELOGRAM/URETERAL LEFT STENT PLACEMENT;  Surgeon: Herrick, Benjamin W, MD;  [facility]: [facility];  Service: Urology;  Laterality: Left;   CYSTOSCOPY/URETEROSCOPY/HOLMIUM LASER/STENT PLACEMENT Left 01/24/2019   Procedure: CYSTOSCOPY, LEFT URETEROSCOPY, HOLMIUM LASER, STONE REMOVAL AND STENT EXCHANGE;  Surgeon: Herrick, Benjamin W, MD;  [facility];  Service: Urology;  Laterality: Left;   LITHOTRIPSY  X 3   RIGHT/LEFT HEART CATH AND CORONARY/GRAFT ANGIOGRAPHY N/A 09/08/2018   Procedure: RIGHT/LEFT HEART CATH AND CORONARY/GRAFT ANGIOGRAPHY;  Surgeon: Jordan, Peter M, MD;  [facility];  Service: Cardiovascular;  Laterality: N/A;   TOTAL ABDOMINAL HYSTERECTOMY     ovaries took before hysterectomy   TUBAL LIGATION     URETERAL STENT PLACEMENT      There were no vitals filed for this visit.   Subjective Assessment - 03/25/21 1714     Subjective "I brounght my cookbook,"    Currently in Pain? No/denies                     ADULT SLP TREATMENT - 03/25/21 1715       General Information   Behavior/Cognition Alert;Pleasant mood;Cooperative;Distractible      Treatment Provided   Treatment provided Cognitive-Linquistic      Cognitive-Linquistic Treatment   Treatment focused on Aphasia;Dysarthria;Cognition    Skilled Treatment Pt exhibited SOB and high pitched ovice after walking from PT. SLP cued abdominal breathing, which was effective. High pitch exhibited throughout session. Pt reports pitch does fluctuate some as pt still occasionally "sounds like a little kid."  SLP targeted divergent naming in simple categories, in which pt completed with occasional  additional processing time. Pt only completed approx 20% of divergent naming task. Phonemic paraphasia x5 noted, in which pt did not note until SLP pointed this out to patient, evern after SLP gave pt a "confused" look and repeated the word in question.      Assessment / Recommendations / Plan   Plan Continue with current plan of care      Progression Toward Goals   Progression toward goals Progressing toward goals   severity of deficits               SLP Short Term Goals - 03/25/21 1719       SLP SHORT TERM GOAL #1   Title pt will engage in cognitive linguistic and speech strength/coordination testing and goals added as necessary    Status Achieved    Target Date 03/05/21      SLP SHORT TERM GOAL #2   Title using compensations, Zyara will remind family when she needs to take meds (pt is now dependent upon family) 80% of opportunities between 3 sessions    Time 2    Period Weeks    Status Not Met    Target Date 03/20/21      SLP SHORT TERM GOAL #3   Title pt will name 10 items in a simple category with rare verbal cues x3 sessions    Baseline 03-23-21    Time 2    Period Weeks    Status Not Met    Target Date 03/20/21      SLP SHORT TERM GOAL #4   Title pt will independently use anomia compensations in 5 minutes simple conversation x3 sessions    Baseline 03-12-21, 03-23-21    Time 2    Period Weeks    Status Not Met    Target Date 03/20/21      SLP SHORT TERM GOAL #5   Title pt will exhibit 100% intelligibility in 5 minutes simple conversation using speech compensations, x3 sessions    Time 2    Period Weeks    Status Partially Met    Target Date 03/20/21      SLP SHORT TERM GOAL #6   Title Diannah will complete a speech HEP with rare min A x2 sessions    Time 2    Period Weeks    Status Not Met    Target Date 03/20/21      SLP SHORT TERM GOAL #7   Title Pt will verbalize and demonstrate 3 attention/memory strategies to aid functioning home with min A over 2  sessions    Time 2    Period Weeks    Status Not Met    Target Date 03/20/21              SLP Long Term Goals - 03/25/21 1720       SLP LONG TERM GOAL #1   Title pt  will complete speech HEP with occasional min A x3 sessions    Time 6    Period Weeks    Status Revised    Target Date 04/17/21      SLP LONG TERM GOAL #2   Title in 7 minutes simple conversation pt will exhibit functional fluid speech using compensations in 2 sessions    Time 6    Period Weeks    Status Revised    Target Date 04/17/21      SLP LONG TERM GOAL #3   Title in 8 minutes mod complex conversation pt will exhibit functional fluid speech using compensations in 2 sessions    Time 10    Period Weeks    Status Revised      SLP LONG TERM GOAL #4   Title pt will complete VNeST and SFA tasks with occasoinal min A in 3 sessions    Time 6    Period Weeks    Status Revised      SLP LONG TERM GOAL #5   Title Jamilah will exhibit functional word-finding using compensations in 8 mintues mod complex conversation in 3 sessions    Time 10    Period Weeks    Status Revised      SLP LONG TERM GOAL #6   Title Drue will independently take meds for 10 days as reported by Elisandra or family    Time 10    Period Weeks    Status Revised      SLP LONG TERM GOAL #7   Title Pt will verbalize and demonstrate 3 attention/memory strategies to aid functioning at work and home with rare min A over 2 sessions    Time 10    Period Weeks    Status Revised              Plan - 03/25/21 1719     Clinical Impression Statement 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 SLP targeted word finding for divergent task, with rare anomia/dysnomia exhibited. Pt benefited from rare min A to correct errors. Change in pitch addressed as pt presented with more normal pitch throughout session. At this time, she is still dependent on family and friends, however they also require training for activities that Eh can safely  do and to help her progress to more independence. Continue skilled ST to maximize cognition, languae and intelligibility for safety, independence and to reduce caregiver burden.    Speech Therapy Frequency 2x / week    Duration 12 weeks    Treatment/Interventions Language facilitation;Cueing hierarchy;SLP instruction and feedback;Cognitive reorganization;Compensatory strategies;Internal/external aids;Patient/family education;Functional tasks;Environmental controls;Oral motor exercises    Potential to Achieve Goals Good    SLP Home Exercise Plan provided    Consulted and Agree with Plan of Care Patient             Patient will benefit from skilled therapeutic intervention in order to improve the following deficits and impairments:   Aphasia  Cognitive communication deficit  Dysarthria and anarthria    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[facility]) 03/09/2021   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   Acute cerebrovascular accident (CVA) ([facility]) 01/20/2021   Acute CVA (cerebrovascular accident) ([facility]) 01/20/2021   GERD (gastroesophageal reflux disease)    Coronary artery disease    Depression 01/06/2020   Pneumonia 01/05/2020   History of chest pain 09/26/2018   Dyspnea on exertion 09/08/2018   Chest pain, rule out   acute myocardial infarction 10/30/2017   Edema 01/25/2017   Tobacco abuse 06/10/2016   Headache 09/15/2015   OSA (obstructive sleep apnea) 07/30/2015   Nephrolithiasis 07/30/2015   Vasovagal syncope 07/30/2015   Carotid artery stenosis, asymptomatic 07/30/2015   CAD (coronary artery disease) 08/22/2013   Essential hypertension 08/22/2013   Diabetes mellitus without complication ([facility])    Dyslipidemia    Seizures ([facility])    Generalized convulsive epilepsy ([facility]) 03/22/2013   Memory deficit 03/22/2013    ,. ,MS, CCC-SLP  03/25/2021, 5:23 PM  [facility] [address] [place], [place], [address] Phone: [phone]   Fax:  [phone]   Name: Brihanna Rachel MRN: [id number] Date of Birth: 06/21/1956  

## 2021-03-25 NOTE — Therapy (Signed)
[facility] [address] [place], [place], [address] Phone: [phone]   Fax:  [phone]  Occupational Therapy Treatment  Patient Details  Name: Samantha Huffman MRN: [id number] Date of Birth: 11-Feb-1956 Referring Provider (OT): Danella Sensing, NP   Encounter Date: 03/25/2021   OT End of Session - 03/25/21 1021     Visit Number 7    Number of Visits 25    Date for OT Re-Evaluation 06/08/21    Authorization Type [employer] (recert completed [id number] due to delay in start of OT due to schedule conflicts)    Authorization - Visit Number 7    Authorization - Number of Visits 10    OT Start Time 1019    OT Stop Time 1100    OT Time Calculation (min) 41 min             Past Medical History:  Diagnosis Date   Carotid artery stenosis    1-39% bilateral stenosis by dopplers 08/2020   Coronary artery disease    a. s/p CABG 10/2012.  cath 05/2016 with moderate LM stenosis, mild LCx and RCA stenosis and occluded LAD with patent LIMA to LAD, free radial to OM.  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   Dyslipidemia    Family history of adverse reaction to anesthesia    mother and sister ponv   Generalized convulsive epilepsy without mention of intractable epilepsy    GERD (gastroesophageal reflux disease)    History of kidney stones    Hypertension    Memory deficit    slight   Migraine    "controlled on daily RX " (06/11/2016)   Nephrolithiasis 07/30/2015   S/p lithotripsy x 3 and right and left ureter stents   OSA (obstructive sleep apnea)    cpap    PONV (postoperative nausea and vomiting)    Seizures ([place])    "controlled w/daily RX; started in 2012; dr thinks they might be from the migraines; last sz was early part of 2016" (06/11/2016)   Type II diabetes mellitus ([place])    metformin   Vasovagal syncope 07/30/2015   2011    Past Surgical History:  Procedure Laterality Date   CARDIAC CATHETERIZATION   2014; 06/11/2016   CARDIAC CATHETERIZATION N/A 06/11/2016   Procedure: Left Heart Cath and Cors/Grafts Angiography;  Surgeon: Sherren Mocha, MD;  Location: [facility];  Service: Cardiovascular;  Laterality: N/A;   CESAREAN SECTION     CORONARY ANGIOPLASTY     CORONARY ARTERY BYPASS GRAFT  March, 2014   LIMA to LAD, left radial to LCx x 2   CYSTOSCOPY W/ URETERAL STENT PLACEMENT Left 01/16/2019   Procedure: CYSTOSCOPY WITH RETROGRADE PYELOGRAM/URETERAL LEFT STENT PLACEMENT;  Surgeon: Ardis Hughs, MD;  Location: [facility];  Service: Urology;  Laterality: Left;   CYSTOSCOPY/URETEROSCOPY/HOLMIUM LASER/STENT PLACEMENT Left 01/24/2019   Procedure: CYSTOSCOPY, LEFT URETEROSCOPY, HOLMIUM LASER, STONE REMOVAL AND STENT EXCHANGE;  Surgeon: Ardis Hughs, MD;  Location: [facility];  Service: Urology;  Laterality: Left;   LITHOTRIPSY  X 3   RIGHT/LEFT HEART CATH AND CORONARY/GRAFT ANGIOGRAPHY N/A 09/08/2018   Procedure: RIGHT/LEFT HEART CATH AND CORONARY/GRAFT ANGIOGRAPHY;  Surgeon: Martinique, Peter M, MD;  Location: [facility];  Service: Cardiovascular;  Laterality: N/A;   TOTAL ABDOMINAL HYSTERECTOMY     ovaries took before hysterectomy   TUBAL LIGATION     URETERAL STENT PLACEMENT      There were no vitals filed for  this visit.   Subjective Assessment - 03/25/21 1020     Subjective  denies pain    Pertinent History CVA with R-sided weakness/ataxia.  PMH:  CAD, HTN, DM, OSA, seizure disorder, dyslipidemia, migraine, CABG (2014), recent bilateral cataract surgeries just prior to CVA    Limitations fall risk, ataxia, visual deficits    Currently in Pain? No/denies                  Treatment: reviewed HEP, and added cane exercises, min v.c                OT Education - 03/25/21 1045     Education Details Therapist reviewed previously coordination HEP as pt was unsure if she as performing correctly, min v.c then pt returned demonstration issued cane  exercises HEP in seated, pt perfromed 10-20 reps each with improved control, min v.c and demonstration initially for al exercises then pt returned demonstration    Person(s) Educated Patient    Methods Explanation;Demonstration;Verbal cues;Handout    Comprehension Verbalized understanding;Returned demonstration;Verbal cues required              OT Short Term Goals - 03/25/21 1050       OT SHORT TERM GOAL #1   Title Pt will be independent with initial HEP.--Check initial STGs (#1-4)  04/09/21    Time 4    Period Weeks    Status On-going      OT SHORT TERM GOAL #2   Title Pt will demo improved coordination and functional reach for ADLs as shown by improving score on box and blocks test by at least 6.    Baseline 42 blocks    Time 4    Period Weeks    Status On-going      OT SHORT TERM GOAL #3   Title Pt will perform bathing with min A.    Time 4    Period Weeks    Status On-going      OT SHORT TERM GOAL #4   Title Pt will perofrm UB dressing mod I.    Time 4    Period Weeks    Status Achieved      OT SHORT TERM GOAL #5   Title Pt will perform LB dressing with min A.--check STGs #5-8  05/10/21    Time 8    Period Weeks    Status Achieved      OT SHORT TERM GOAL #6   Title Pt will perform bathing with supervision.    Time 8    Period Weeks    Status New      OT SHORT TERM GOAL #7   Title Pt will peform simple home maintenane task and snack prep mod I.    Time 8    Period Weeks    Status New      OT SHORT TERM GOAL #8   Title Pt will cut food mod I using AE prn.    Time 8    Period Weeks    Status New               OT Long Term Goals - 03/25/21 1051       OT LONG TERM GOAL #1   Title Pt will be independent with updated HEP--check LTGs 06/08/21    Time 12    Period Weeks    Status New      OT LONG TERM GOAL #2   Title Pt will perform BADLs mod  I with AE/DME and strategies prn.    Time 12    Period Weeks    Status New      OT LONG TERM GOAL  #3   Title Pt wil be for memory/cognitive compensation strategies for ADL/IADLs prn.    Time 12    Period Weeks    Status New      OT LONG TERM GOAL #4   Title Pt will perform simple cooking task with supervision.    Time 12    Period Weeks    Status New      OT LONG TERM GOAL #5   Title Pt will perform environmental scanning/navigation with at least 90% accuracy (with head turns) without reports of dizziness/LOB.    Time 12    Period Weeks    Status New      OT LONG TERM GOAL #6   Title Pt will improve RUE coordination as shown by completing 9-hole peg test in less than 36sec without drops.    Time 12    Period Weeks    Status New                   Plan - 03/25/21 1049     Clinical Impression Statement Pt is progressing towards goals. She demonstrates understanding of HEP for coordiantion/ control and cone exercises.    OT Occupational Profile and History Detailed Assessment- Review of Records and additional review of physical, cognitive, psychosocial history related to current functional performance    Occupational performance deficits (Please refer to evaluation for details): ADL's;IADL's;Leisure;Social Participation    Body Structure / Function / Physical Skills ADL;Decreased knowledge of use of DME;Strength;GMC;Dexterity;Balance;UE functional use;IADL;Endurance;ROM;Vision;Improper spinal/pelvic alignment;Coordination;Mobility;FMC    Cognitive Skills Attention;Memory;Understand    Rehab Potential Good    Clinical Decision Making Several treatment options, min-mod task modification necessary    Comorbidities Affecting Occupational Performance: May have comorbidities impacting occupational performance    Modification or Assistance to Complete Evaluation  Min-Moderate modification of tasks or assist with assess necessary to complete eval    OT Frequency 2x / week    OT Duration 12 weeks   +eval   OT Treatment/Interventions Self-care/ADL training;Moist Heat;DME and/or  AE instruction;Balance training;Aquatic Therapy;Therapeutic activities;Cognitive remediation/compensation;Therapeutic exercise;Cryotherapy;Neuromuscular education;Functional Mobility Training;Passive range of motion;Visual/perceptual remediation/compensation;Patient/family education;Manual Therapy;Energy conservation;Paraffin;Fluidtherapy    Plan wt. bearing/scapular stabilization HEP, fine motor coordination/functional use of UE's, ?re-assess B&B (STG2)    Consulted and Agree with Plan of Care Patient;Family member/caregiver    Family Member Consulted dtr             Patient will benefit from skilled therapeutic intervention in order to improve the following deficits and impairments:   Body Structure / Function / Physical Skills: ADL, Decreased knowledge of use of DME, Strength, GMC, Dexterity, Balance, UE functional use, IADL, Endurance, ROM, Vision, Improper spinal/pelvic alignment, Coordination, Mobility, FMC Cognitive Skills: Attention, Memory, Understand     Visit Diagnosis: Other lack of coordination  Visuospatial deficit  Attention and concentration deficit  Other symptoms and signs involving cognitive functions following cerebral infarction  Muscle weakness (generalized)  Other abnormalities of gait and mobility    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[place]) 03/09/2021   Cerebellar stroke ([place]) 01/30/2021   Acute cerebrovascular accident (CVA) ([place]) 01/20/2021   Acute CVA (cerebrovascular accident) ([place]) 01/20/2021   GERD (gastroesophageal reflux disease)    Coronary artery disease    Depression 01/06/2020  Pneumonia 01/05/2020   History of chest pain 09/26/2018   Dyspnea on exertion 09/08/2018   Chest pain, rule out acute myocardial infarction 10/30/2017   Edema 01/25/2017   Tobacco abuse 06/10/2016   Headache 09/15/2015   OSA (obstructive sleep apnea) 07/30/2015   Nephrolithiasis 07/30/2015   Vasovagal  syncope 07/30/2015   Carotid artery stenosis, asymptomatic 07/30/2015   CAD (coronary artery disease) 08/22/2013   Essential hypertension 08/22/2013   Diabetes mellitus without complication ([place])    Dyslipidemia    Seizures ([place])    Generalized convulsive epilepsy ([place]) 03/22/2013   Memory deficit 03/22/2013    Dallin Mccorkel 03/25/2021, 10:55 AM  [facility] [address] [place], [place], [address] Phone: [phone]   Fax:  [phone]  Name: Samantha Huffman MRN: [id number] Date of Birth: September 02, 1955

## 2021-03-25 NOTE — Patient Instructions (Addendum)
             Instructions    With your right hand/arm, perform the following for at least 20 min per day.  Go slow and work on control.  Keep shoulder down.  If you start shaking, try to stop take a breath and relax shoulder before restarting.     Line up 3-5 up bottles (empty) on table away from you,  then reach out to touch each one slowly and lightly, trying not to move them.  Repeat 10x   Pick up empty, plastic/paper cup, bring it to you mouth like you are going to take a drink.  Repeat 10x   Flip playing cards over 1 at a time.   Pick up coins and place in container or coin back (make sure coin bank is away from you so you have to reach).  Make sure you watch your hand and try to lift off coin bank after releasing coin.     Wipe table top forward/back and side to side x15-20 each.   Stack blocks, dominoes, or checkers.           Sit for all exercises do not stand  Chest Press sitting only    Face away from anchor in stride stance. Grasp bar, palms down. Press arms out. Repeat __ times per set. Do __ sets per session. Do __ sessions per week. Anchor Height: Chest  http://tub.exer.us/294   Copyright  VHI. All rights reserved.     ROM: Abduction - Wand sitting  Holding wand with left hand palm up, push wand directly out to side, leading with other hand palm down, until stretch is felt. Hold 5 seconds. Repeat 10 times per set. Do 1 sessions per day.      [place]   With arms straight, hold cane forward at waist. Raise cane above head. Hold 3 seconds. Repeat 10 times. Do 1 times per day.

## 2021-03-30 ENCOUNTER — Encounter: Payer: Self-pay | Admitting: Occupational Therapy

## 2021-03-30 ENCOUNTER — Other Ambulatory Visit: Payer: Self-pay

## 2021-03-30 ENCOUNTER — Ambulatory Visit: Payer: Medicare HMO | Admitting: Physical Therapy

## 2021-03-30 ENCOUNTER — Ambulatory Visit: Payer: Medicare HMO | Admitting: Occupational Therapy

## 2021-03-30 ENCOUNTER — Ambulatory Visit: Payer: Medicare HMO

## 2021-03-30 DIAGNOSIS — R2689 Other abnormalities of gait and mobility: Secondary | ICD-10-CM

## 2021-03-30 DIAGNOSIS — R471 Dysarthria and anarthria: Secondary | ICD-10-CM

## 2021-03-30 DIAGNOSIS — R41842 Visuospatial deficit: Secondary | ICD-10-CM

## 2021-03-30 DIAGNOSIS — R41840 Attention and concentration deficit: Secondary | ICD-10-CM

## 2021-03-30 DIAGNOSIS — I69318 Other symptoms and signs involving cognitive functions following cerebral infarction: Secondary | ICD-10-CM

## 2021-03-30 DIAGNOSIS — R4701 Aphasia: Secondary | ICD-10-CM

## 2021-03-30 DIAGNOSIS — R270 Ataxia, unspecified: Secondary | ICD-10-CM

## 2021-03-30 DIAGNOSIS — R2681 Unsteadiness on feet: Secondary | ICD-10-CM

## 2021-03-30 DIAGNOSIS — R278 Other lack of coordination: Secondary | ICD-10-CM

## 2021-03-30 DIAGNOSIS — R41841 Cognitive communication deficit: Secondary | ICD-10-CM

## 2021-03-30 DIAGNOSIS — M6281 Muscle weakness (generalized): Secondary | ICD-10-CM

## 2021-03-30 NOTE — Therapy (Signed)
[facility] [address] [place], [place], [address] Phone: [phone]   Fax:  [phone]  Speech Language Pathology Treatment  Patient Details  Name: Samantha Huffman MRN: [id number] Date of Birth: 09/11/1955 Referring Provider (SLP): Lauraine Rinne, [place]   Encounter Date: 03/30/2021   End of Session - 03/30/21 1138     Visit Number 8    Number of Visits 25    Date for SLP Re-Evaluation 05/20/21    SLP Start Time 0934    SLP Stop Time  9371    SLP Time Calculation (min) 41 min    Activity Tolerance Patient tolerated treatment well             Past Medical History:  Diagnosis Date   Carotid artery stenosis    1-39% bilateral stenosis by dopplers 08/2020   Coronary artery disease    a. s/p CABG 10/2012.  cath 05/2016 with moderate LM stenosis, mild LCx and RCA stenosis and occluded LAD with patent LIMA to LAD, free radial to OM.  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   Dyslipidemia    Family history of adverse reaction to anesthesia    mother and sister ponv   Generalized convulsive epilepsy without mention of intractable epilepsy    GERD (gastroesophageal reflux disease)    History of kidney stones    Hypertension    Memory deficit    slight   Migraine    "controlled on daily RX " (06/11/2016)   Nephrolithiasis 07/30/2015   S/p lithotripsy x 3 and right and left ureter stents   OSA (obstructive sleep apnea)    cpap    PONV (postoperative nausea and vomiting)    Seizures ([place])    "controlled w/daily RX; started in 2012; dr thinks they might be from the migraines; last sz was early part of 2016" (06/11/2016)   Type II diabetes mellitus ([place])    metformin   Vasovagal syncope 07/30/2015   2011    Past Surgical History:  Procedure Laterality Date   CARDIAC CATHETERIZATION  2014; 06/11/2016   CARDIAC CATHETERIZATION N/A 06/11/2016   Procedure: Left Heart Cath and Cors/Grafts Angiography;  Surgeon:  Sherren Mocha, MD;  Location: [facility];  Service: Cardiovascular;  Laterality: N/A;   CESAREAN SECTION     CORONARY ANGIOPLASTY     CORONARY ARTERY BYPASS GRAFT  March, 2014   LIMA to LAD, left radial to LCx x 2   CYSTOSCOPY W/ URETERAL STENT PLACEMENT Left 01/16/2019   Procedure: CYSTOSCOPY WITH RETROGRADE PYELOGRAM/URETERAL LEFT STENT PLACEMENT;  Surgeon: Ardis Hughs, MD;  [facility];  Service: Urology;  Laterality: Left;   CYSTOSCOPY/URETEROSCOPY/HOLMIUM LASER/STENT PLACEMENT Left 01/24/2019   Procedure: CYSTOSCOPY, LEFT URETEROSCOPY, HOLMIUM LASER, STONE REMOVAL AND STENT EXCHANGE;  Surgeon: Ardis Hughs, MD;  [facility];  Service: Urology;  Laterality: Left;   LITHOTRIPSY  X 3   RIGHT/LEFT HEART CATH AND CORONARY/GRAFT ANGIOGRAPHY N/A 09/08/2018   Procedure: RIGHT/LEFT HEART CATH AND CORONARY/GRAFT ANGIOGRAPHY;  Surgeon: Martinique, Peter M, MD;  Location: [facility];  Service: Cardiovascular;  Laterality: N/A;   TOTAL ABDOMINAL HYSTERECTOMY     ovaries took before hysterectomy   TUBAL LIGATION     URETERAL STENT PLACEMENT      There were no vitals filed for this visit.   Subjective Assessment - 03/30/21 0942     Subjective "We went to [facility] this weekend."    Currently in Pain? No/denies  ADULT SLP TREATMENT - 03/30/21 0943       General Information   Behavior/Cognition Alert;Pleasant mood;Cooperative;Distractible      Treatment Provided   Treatment provided Cognitive-Linquistic      Cognitive-Linquistic Treatment   Treatment focused on Aphasia;Dysarthria;Cognition    Skilled Treatment Pt's voice WNL for entire session. "I took my meds, and they told me they wanted to eat  when they got back adn I was ready to go." SLP encouraged pt to take her meds and have dtr check behind her. Pt agreed with this - pt explained her morning routine using WNL language throughout-  she has to have family cook her some  breakfast in AM for AM meds so this is dependent upon family. "They get up in the morning before I get up anyway." Pt does not think she can cook for herself at this point due to rt hand tremor - "I might burn myself" indicating anticipatory awareness. homwork provided. Pt remarked she is extrememly pleased about her voice today.      Assessment / Recommendations / Plan   Plan Continue with current plan of care      Progression Toward Goals   Progression toward goals Progressing toward goals              SLP Education - 03/30/21 1138     Education Details cont homework from previous time    Person(s) Educated Patient    Methods Explanation    Comprehension Verbalized understanding              SLP Short Term Goals - 03/30/21 1140       SLP SHORT TERM GOAL #1   Title pt will engage in cognitive linguistic and speech strength/coordination testing and goals added as necessary    Status Achieved    Target Date 03/05/21      SLP SHORT TERM GOAL #2   Title using compensations, Samantha Huffman will remind family when she needs to take meds (pt is now dependent upon family) 80% of opportunities between 3 sessions    Status Not Met    Target Date 03/20/21      SLP SHORT TERM GOAL #3   Title pt will name 10 items in a simple category with rare verbal cues x3 sessions    Baseline 03-23-21    Status Not Met    Target Date 03/20/21      SLP SHORT TERM GOAL #4   Title pt will independently use anomia compensations in 5 minutes simple conversation x3 sessions    Baseline 03-12-21, 03-23-21    Status Not Met    Target Date 03/20/21      SLP SHORT TERM GOAL #5   Title pt will exhibit 100% intelligibility in 5 minutes simple conversation using speech compensations, x3 sessions    Status Partially Met    Target Date 03/20/21      SLP SHORT TERM GOAL #6   Title Samantha Huffman will complete a speech HEP with rare min A x2 sessions    Status Not Met    Target Date 03/20/21      SLP SHORT TERM GOAL #7    Title Pt will verbalize and demonstrate 3 attention/memory strategies to aid functioning home with min A over 2 sessions    Status Not Met    Target Date 03/20/21              SLP Long Term Goals - 03/30/21 1140  SLP LONG TERM GOAL #1   Title pt will complete speech HEP with occasional min A x3 sessions    Time 6    Period Weeks    Status Revised      SLP LONG TERM GOAL #2   Title in 7 minutes simple conversation pt will exhibit functional fluid speech using compensations in 2 sessions    Time 6    Period Weeks    Status Revised    Target Date 04/17/21      SLP LONG TERM GOAL #3   Title in 8 minutes mod complex conversation pt will exhibit functional fluid speech using compensations in 2 sessions    Time 10    Period Weeks    Status Revised    Target Date 05/15/21      SLP LONG TERM GOAL #4   Title pt will complete VNeST and SFA tasks with occasoinal min A in 3 sessions    Time 6    Period Weeks    Status Revised    Target Date 05/15/21      SLP LONG TERM GOAL #5   Title Samantha Huffman will exhibit functional word-finding using compensations in 8 mintues mod complex conversation in 3 sessions    Baseline 03-30-21    Time 10    Period Weeks    Status Revised    Target Date 05/15/21      SLP LONG TERM GOAL #6   Title Samantha Huffman will independently take meds for 10 days as reported by Samantha Huffman or family    Time 10    Period Weeks    Status Revised    Target Date 05/15/21      SLP LONG TERM GOAL #7   Title Pt will verbalize and demonstrate 3 attention/memory strategies to aid functioning at work and home with rare min A over 2 sessions    Time 10    Period Weeks    Status Revised    Target Date 05/15/21              Plan - 03/30/21 1138     Clinical Impression Statement 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 At this time, she is still dependent on family and friends, however they also require training for activities that Marshayla can safely do and  to help her progress to more independence. Continue skilled ST to maximize cognition, languae and intelligibility for safety, independence and to reduce caregiver burden.    Speech Therapy Frequency 2x / week    Duration 12 weeks    Treatment/Interventions Language facilitation;Cueing hierarchy;SLP instruction and feedback;Cognitive reorganization;Compensatory strategies;Internal/external aids;Patient/family education;Functional tasks;Environmental controls;Oral motor exercises    Potential to Achieve Goals Good    SLP Home Exercise Plan provided    Consulted and Agree with Plan of Care Patient             Patient will benefit from skilled therapeutic intervention in order to improve the following deficits and impairments:   Aphasia  Cognitive communication deficit  Dysarthria and anarthria    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[place]) 03/09/2021   Cerebellar stroke ([place]) 01/30/2021   Acute cerebrovascular accident (CVA) ([place]) 01/20/2021   Acute CVA (cerebrovascular accident) ([place]) 01/20/2021   GERD (gastroesophageal reflux disease)    Coronary artery disease    Depression 01/06/2020   Pneumonia 01/05/2020   History of chest pain 09/26/2018   Dyspnea on exertion 09/08/2018  Chest pain, rule out acute myocardial infarction 10/30/2017   Edema 01/25/2017   Tobacco abuse 06/10/2016   Headache 09/15/2015   OSA (obstructive sleep apnea) 07/30/2015   Nephrolithiasis 07/30/2015   Vasovagal syncope 07/30/2015   Carotid artery stenosis, asymptomatic 07/30/2015   CAD (coronary artery disease) 08/22/2013   Essential hypertension 08/22/2013   Diabetes mellitus without complication ([place])    Dyslipidemia    Seizures ([place])    Generalized convulsive epilepsy ([place]) 03/22/2013   Memory deficit 03/22/2013    [place]  ,[place], Cor[place]  03/30/2021, 11:42 AM  [facility] [address] [place], [place], [address] Phone: [phone]   Fax:  [phone]   Name: Samantha Huffman MRN: [id number] Date of Birth: December 10, 1955

## 2021-03-30 NOTE — Therapy (Signed)
[facility] [address] [place], [place], [address] Phone: [phone]   Fax:  [phone]  Occupational Therapy Treatment  Patient Details  Name: Samantha Huffman MRN: [id number] Date of Birth: April 08, 1956 Referring Provider (OT): Danella Sensing, NP   Encounter Date: 03/30/2021   OT End of Session - 03/30/21 0809     Visit Number 8    Number of Visits 25    Date for OT Re-Evaluation 06/08/21    Authorization Type [employer] Medicare (recert completed [id number] due to delay in start of OT due to schedule conflicts)    Authorization - Visit Number 8    Authorization - Number of Visits 10    Progress Note Due on Visit 10    OT Start Time 0807    OT Stop Time 0845    OT Time Calculation (min) 38 min    Activity Tolerance Patient tolerated treatment well    Behavior During Therapy [facility] for tasks assessed/performed             Past Medical History:  Diagnosis Date   Carotid artery stenosis    1-39% bilateral stenosis by dopplers 08/2020   Coronary artery disease    a. s/p CABG 10/2012.  cath 05/2016 with moderate LM stenosis, mild LCx and RCA stenosis and occluded LAD with patent LIMA to LAD, free radial to OM.  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   Dyslipidemia    Family history of adverse reaction to anesthesia    mother and sister ponv   Generalized convulsive epilepsy without mention of intractable epilepsy    GERD (gastroesophageal reflux disease)    History of kidney stones    Hypertension    Memory deficit    slight   Migraine    "controlled on daily RX " (06/11/2016)   Nephrolithiasis 07/30/2015   S/p lithotripsy x 3 and right and left ureter stents   OSA (obstructive sleep apnea)    cpap    PONV (postoperative nausea and vomiting)    Seizures ([place])    "controlled w/daily RX; started in 2012; dr thinks they might be from the migraines; last sz was early part of 2016" (06/11/2016)   Type II diabetes  mellitus ([place])    metformin   Vasovagal syncope 07/30/2015   2011    Past Surgical History:  Procedure Laterality Date   CARDIAC CATHETERIZATION  2014; 06/11/2016   CARDIAC CATHETERIZATION N/A 06/11/2016   Procedure: Left Heart Cath and Cors/Grafts Angiography;  Surgeon: Sherren Mocha, MD;  Location: [facility];  Service: Cardiovascular;  Laterality: N/A;   CESAREAN SECTION     CORONARY ANGIOPLASTY     CORONARY ARTERY BYPASS GRAFT  March, 2014   LIMA to LAD, left radial to LCx x 2   CYSTOSCOPY W/ URETERAL STENT PLACEMENT Left 01/16/2019   Procedure: CYSTOSCOPY WITH RETROGRADE PYELOGRAM/URETERAL LEFT STENT PLACEMENT;  Surgeon: Ardis Hughs, MD;  [facility];  Service: Urology;  Laterality: Left;   CYSTOSCOPY/URETEROSCOPY/HOLMIUM LASER/STENT PLACEMENT Left 01/24/2019   Procedure: CYSTOSCOPY, LEFT URETEROSCOPY, HOLMIUM LASER, STONE REMOVAL AND STENT EXCHANGE;  Surgeon: Ardis Hughs, MD;  [facility];  Service: Urology;  Laterality: Left;   LITHOTRIPSY  X 3   RIGHT/LEFT HEART CATH AND CORONARY/GRAFT ANGIOGRAPHY N/A 09/08/2018   Procedure: RIGHT/LEFT HEART CATH AND CORONARY/GRAFT ANGIOGRAPHY;  Surgeon: Martinique, Peter M, MD;  Location: [facility];  Service: Cardiovascular;  Laterality: N/A;   TOTAL ABDOMINAL HYSTERECTOMY  ovaries took before hysterectomy   TUBAL LIGATION     URETERAL STENT PLACEMENT      There were no vitals filed for this visit.   Subjective Assessment - 03/30/21 0809     Subjective  denies pain    Pertinent History CVA with R-sided weakness/ataxia.  PMH:  CAD, HTN, DM, OSA, seizure disorder, dyslipidemia, migraine, CABG (2014), recent bilateral cataract surgeries just prior to CVA    Limitations fall risk, ataxia, visual deficits    Patient Stated Goals return to driving, return to living alone, cook    Currently in Pain? No/denies             Checked STGs and discussed progress.  Placing small pegs in pegboard to copy  design with min cueing to only use R hand for manipulation and min difficulty/incr time.  Removing using in-hand manipulation and min range functional reach with min difficulty.  Practiced writing name and address.  Pt with good legibility with incr time (name in cursive).  Then attempted to print address with good legibility, but incr time and effort.  Pt with difficulty with spelling and memory of address.    Sitting, functional reaching to place/remove clothespins with 1-8lb resistance (for grading practice) on vertical pole with min difficulty and incr time/effort.           OT Education - 03/30/21 0840     Education Details Writing HEP for memory/cognition and writing practice    Person(s) Educated Patient    Methods Explanation;Handout    Comprehension Verbalized understanding              OT Short Term Goals - 03/30/21 0810       OT SHORT TERM GOAL #1   Title Pt will be independent with initial HEP.--Check initial STGs (#1-4)  04/09/21    Time 4    Period Weeks    Status Achieved      OT SHORT TERM GOAL #2   Title Pt will demo improved coordination and functional reach for ADLs as shown by improving score on box and blocks test by at least 6.    Baseline 42 blocks    Time 4    Period Weeks    Status On-going   03/30/21:  43 blocks     OT SHORT TERM GOAL #3   Title Pt will perform bathing with min A.    Time 4    Period Weeks    Status Achieved   03/30/21:  min A for washing, needs assist for transfer due to prefer bath     OT SHORT TERM GOAL #4   Title Pt will perofrm UB dressing mod I.    Time 4    Period Weeks    Status Achieved      OT SHORT TERM GOAL #5   Title Pt will perform LB dressing with min A.--check STGs #5-8  05/10/21    Time 8    Period Weeks    Status Achieved      OT SHORT TERM GOAL #6   Title Pt will perform bathing with supervision except transfer (pt prefers bath and will need A due to safety, has been provided with info for tub transfer  bench).    Time 8    Period Weeks    Status Revised      OT SHORT TERM GOAL #7   Title Pt will peform simple home maintenane task and snack prep mod I.  Time 8    Period Weeks    Status New      OT SHORT TERM GOAL #8   Title Pt will cut food mod I using AE prn.    Time 8    Period Weeks    Status New               OT Long Term Goals - 03/25/21 1051       OT LONG TERM GOAL #1   Title Pt will be independent with updated HEP--check LTGs 06/08/21    Time 12    Period Weeks    Status New      OT LONG TERM GOAL #2   Title Pt will perform BADLs mod I with AE/DME and strategies prn.    Time 12    Period Weeks    Status New      OT LONG TERM GOAL #3   Title Pt wil be for memory/cognitive compensation strategies for ADL/IADLs prn.    Time 12    Period Weeks    Status New      OT LONG TERM GOAL #4   Title Pt will perform simple cooking task with supervision.    Time 12    Period Weeks    Status New      OT LONG TERM GOAL #5   Title Pt will perform environmental scanning/navigation with at least 90% accuracy (with head turns) without reports of dizziness/LOB.    Time 12    Period Weeks    Status New      OT LONG TERM GOAL #6   Title Pt will improve RUE coordination as shown by completing 9-hole peg test in less than 36sec without drops.    Time 12    Period Weeks    Status New                   Plan - 03/30/21 0810     Clinical Impression Statement Pt is progressing towards goal with improving RUE coordination and control.  Pt demo improving ADL performance as well.    OT Occupational Profile and History Detailed Assessment- Review of Records and additional review of physical, cognitive, psychosocial history related to current functional performance    Occupational performance deficits (Please refer to evaluation for details): ADL's;IADL's;Leisure;Social Participation    Body Structure / Function / Physical Skills ADL;Decreased knowledge of use of  DME;Strength;GMC;Dexterity;Balance;UE functional use;IADL;Endurance;ROM;Vision;Improper spinal/pelvic alignment;Coordination;Mobility;FMC    Cognitive Skills Attention;Memory;Understand    Rehab Potential Good    Clinical Decision Making Several treatment options, min-mod task modification necessary    Comorbidities Affecting Occupational Performance: May have comorbidities impacting occupational performance    Modification or Assistance to Complete Evaluation  Min-Moderate modification of tasks or assist with assess necessary to complete eval    OT Frequency 2x / week    OT Duration 12 weeks   +eval   OT Treatment/Interventions Self-care/ADL training;Moist Heat;DME and/or AE instruction;Balance training;Aquatic Therapy;Therapeutic activities;Cognitive remediation/compensation;Therapeutic exercise;Cryotherapy;Neuromuscular education;Functional Mobility Training;Passive range of motion;Visual/perceptual remediation/compensation;Patient/family education;Manual Therapy;Energy conservation;Paraffin;Fluidtherapy    Plan wt. bearing/scapular stabilization HEP, functional reaching, IADL task    Consulted and Agree with Plan of Care Patient;Family member/caregiver    Family Member Consulted dtr             Patient will benefit from skilled therapeutic intervention in order to improve the following deficits and impairments:   Body Structure / Function / Physical Skills: ADL, Decreased knowledge of use of DME, Strength,  GMC, Dexterity, Balance, UE functional use, IADL, Endurance, ROM, Vision, Improper spinal/pelvic alignment, Coordination, Mobility, FMC Cognitive Skills: Attention, Memory, Understand     Visit Diagnosis: Other lack of coordination  Visuospatial deficit  Attention and concentration deficit  Other symptoms and signs involving cognitive functions following cerebral infarction  Muscle weakness (generalized)  Other abnormalities of gait and mobility  Ataxia    Problem  List Patient Active Problem List   Diagnosis Date Noted   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[place]) 03/09/2021   Cerebellar stroke ([place]) 01/30/2021   Acute cerebrovascular accident (CVA) ([place]) 01/20/2021   Acute CVA (cerebrovascular accident) ([place]) 01/20/2021   GERD (gastroesophageal reflux disease)    Coronary artery disease    Depression 01/06/2020   Pneumonia 01/05/2020   History of chest pain 09/26/2018   Dyspnea on exertion 09/08/2018   Chest pain, rule out acute myocardial infarction 10/30/2017   Edema 01/25/2017   Tobacco abuse 06/10/2016   Headache 09/15/2015   OSA (obstructive sleep apnea) 07/30/2015   Nephrolithiasis 07/30/2015   Vasovagal syncope 07/30/2015   Carotid artery stenosis, asymptomatic 07/30/2015   CAD (coronary artery disease) 08/22/2013   Essential hypertension 08/22/2013   Diabetes mellitus without complication ([place])    Dyslipidemia    Seizures ([place])    Generalized convulsive epilepsy ([place]) 03/22/2013   Memory deficit 03/22/2013    [facility] 03/30/2021, 2:55 PM  [facility] [address] [place], [place], [address] Phone: [phone]   Fax:  [phone]  Name: Samantha Huffman MRN: [id number] Date of Birth: Oct 14, 1955  Vianne Bulls, OTR/L [facility] [address] [place], [place]  [address] [phone] phone [phone] 03/30/21 2:55 PM

## 2021-03-30 NOTE — Patient Instructions (Addendum)
   Practice trying to write down 1-2 sentences a day about what your plans are that day or what you've done that day.

## 2021-03-30 NOTE — Therapy (Signed)
[facility] [address] [place], [place], [address] Phone: [phone]   Fax:  [phone]  Physical Therapy Treatment  Patient Details  Name: Samantha Huffman MRN: [id number] Date of Birth: Mar 19, 1956 Referring Provider (PT): Lauraine Rinne, PA-C   Encounter Date: 03/30/2021   PT End of Session - 03/30/21 1941     Visit Number 8    Number of Visits 25    Date for PT Re-Evaluation 05/15/21    Authorization Type Aetna Medicare    Authorization Time Period 02-19-21 - 05-21-21    Progress Note Due on Visit 10    PT Start Time 0847    PT Stop Time 0930    PT Time Calculation (min) 43 min    Equipment Utilized During Treatment Gait belt    Activity Tolerance Patient tolerated treatment well    Behavior During Therapy [facility] for tasks assessed/performed             Past Medical History:  Diagnosis Date   Carotid artery stenosis    1-39% bilateral stenosis by dopplers 08/2020   Coronary artery disease    a. s/p CABG 10/2012.  cath 05/2016 with moderate LM stenosis, mild LCx and RCA stenosis and occluded LAD with patent LIMA to LAD, free radial to OM.  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   Dyslipidemia    Family history of adverse reaction to anesthesia    mother and sister ponv   Generalized convulsive epilepsy without mention of intractable epilepsy    GERD (gastroesophageal reflux disease)    History of kidney stones    Hypertension    Memory deficit    slight   Migraine    "controlled on daily RX " (06/11/2016)   Nephrolithiasis 07/30/2015   S/p lithotripsy x 3 and right and left ureter stents   OSA (obstructive sleep apnea)    cpap    PONV (postoperative nausea and vomiting)    Seizures ([place])    "controlled w/daily RX; started in 2012; dr thinks they might be from the migraines; last sz was early part of 2016" (06/11/2016)   Type II diabetes mellitus ([place])    metformin   Vasovagal syncope 07/30/2015    2011    Past Surgical History:  Procedure Laterality Date   CARDIAC CATHETERIZATION  2014; 06/11/2016   CARDIAC CATHETERIZATION N/A 06/11/2016   Procedure: Left Heart Cath and Cors/Grafts Angiography;  Surgeon: Sherren Mocha, MD;  Location: [facility];  Service: Cardiovascular;  Laterality: N/A;   CESAREAN SECTION     CORONARY ANGIOPLASTY     CORONARY ARTERY BYPASS GRAFT  March, 2014   LIMA to LAD, left radial to LCx x 2   CYSTOSCOPY W/ URETERAL STENT PLACEMENT Left 01/16/2019   Procedure: CYSTOSCOPY WITH RETROGRADE PYELOGRAM/URETERAL LEFT STENT PLACEMENT;  Surgeon: Ardis Hughs, MD;  [facility];  Service: Urology;  Laterality: Left;   CYSTOSCOPY/URETEROSCOPY/HOLMIUM LASER/STENT PLACEMENT Left 01/24/2019   Procedure: CYSTOSCOPY, LEFT URETEROSCOPY, HOLMIUM LASER, STONE REMOVAL AND STENT EXCHANGE;  Surgeon: Ardis Hughs, MD;  [facility];  Service: Urology;  Laterality: Left;   LITHOTRIPSY  X 3   RIGHT/LEFT HEART CATH AND CORONARY/GRAFT ANGIOGRAPHY N/A 09/08/2018   Procedure: RIGHT/LEFT HEART CATH AND CORONARY/GRAFT ANGIOGRAPHY;  Surgeon: Martinique, Peter M, MD;  Location: [facility];  Service: Cardiovascular;  Laterality: N/A;   TOTAL ABDOMINAL HYSTERECTOMY     ovaries took before hysterectomy   TUBAL LIGATION  URETERAL STENT PLACEMENT      There were no vitals filed for this visit.   Subjective Assessment - 03/30/21 0845     Subjective Pt states she went to [facility] this past weekend to celebrate her grandson's birthday - states she got home last night - is tired today    Pertinent History Carotid artery stenosis, HTN, epilepsy with seizure disorder - last seizure was in early 2016:  migraines, DM, memory deficit, CAD,    Diagnostic tests CT of head and neck:  MRA    Patient Stated Goals Improve balance and walking    Currently in Pain? No/denies                               [facility] Adult PT Treatment/Exercise - 03/30/21  0903       Transfers   Transfers Sit to Stand;Stand to Sit    Number of Reps Other reps (comment)   5   Comments feet on Airex - no UE support used from mat      Ambulation/Gait   Ambulation/Gait Yes    Ambulation/Gait Assistance 5: Supervision    Ambulation/Gait Assistance Details 2# used on RLE; 2# dumb bells held in each hand for first 115'    Ambulation Distance (Feet) 350 Feet    Assistive device None    Gait Pattern Step-through pattern    Ambulation Surface Level;Indoor    Stairs Yes    Stairs Assistance 5: Supervision    Stair Management Technique One rail Left;Alternating pattern;Step to pattern;Forwards    Number of Stairs 8    Height of Stairs 6      Neuro Re-ed    Neuro Re-ed Details  Pt performed coordination exercises and activities - kicking 4 bean bags 35' x 2 reps - alternating feet - with CGA;      Knee/Hip Exercises: Standing   Forward Step Up Right;Left;2 sets;10 reps    Other Standing Knee Exercises Tap ups to 6" step alternating feet 5 reps to 1st step; tap ups to 2nd step alternating feet 10 reps each foot with CGA without UE support            NeuroRe-ed:  pt performed cone taps to 4 cones, progressing to tipping cone over and standing upright with CGA  Floor ladder - stepping over rungs forward, step over step; stepping out/in 4 reps with CGA to min assist for recovery of LOB  Stepping stones of various sizes used for SLS balance and to improve feedforward balance reaction with min to mod assist with LOB          PT Short Term Goals - 03/30/21 0848       PT SHORT TERM GOAL #1   Title Improve Berg score from 34/56 to >/= 39/56 for reduced fall risk.    Baseline 03/18/21: 50/56 scored today    Status Achieved      PT SHORT TERM GOAL #2   Title Improve TUG score from 17.32 secs to </= 14.5 secs without use of device to increase safety with ambulation.    Baseline 03/18/21: 14.41 secs with RW, 15.18 sec's no AD, met with RW not no AD     Status On-going    Target Date 04/17/21      PT SHORT TERM GOAL #3   Title Increase gait velocity from 2.25 ft/sec without device to >/= 2.6 ft/sec with CGA for incr. gait efficiency.  Baseline 27/27/22: 2.41 ft/sec with RW with supervision, 2.65 ft/sec no AD with min guard assist, met with no AD    Status Achieved      PT SHORT TERM GOAL #4   Title Assess DGI and establish goal as appropriate. Updated STG:  Increase DGI score from 13/24 to >/= 18/24 to reduce fall risk.    Baseline 03/20/21: 13/24 scored today as baseline, baseline added to LTG    Status New    Target Date 04/17/21      PT SHORT TERM GOAL #5   Title Pt wil amb. 350' with CGA on flat, even surface without LOB.; Updated STG;  Amb. 350' with supervision without use of RW, including amb. to all therapies without RW without LOB.    Baseline 03/18/21: met in session with RW    Status New    Target Date 04/17/21      PT SHORT TERM GOAL #6   Title Independent in HEP for balance and strengthening exercises.    Baseline 03/18/21: met with current HEP per pt report    Status Achieved    Target Date 03/20/21               PT Long Term Goals - 03/23/21 1955       PT LONG TERM GOAL #1   Title Improve Berg score from 34/56 to >/= 50/56 to reduce fall risk.. (all LTGs due 05/22/21)    Baseline 34/56 on 02-19-21    Time 12    Period Weeks    Status On-going      PT LONG TERM GOAL #2   Title Pt will ambulate 1000' modified independently without device on even and uneven surfaces.    Time 12    Period Weeks    Status On-going      PT LONG TERM GOAL #3   Title Improve TUG score from 17.32 secs to </= 13 secs to reduce fall risk.    Baseline 17.32 secs on 02-19-21 with CGA    Time 12    Period Weeks    Status On-going      PT LONG TERM GOAL #4   Title Improve DGI by at least 10 points to increase safety with gait.    Baseline 03/20/21: 13/24    Time 12    Period Weeks    Status On-going      PT LONG TERM GOAL #5    Title Independent in updated HEP as appropriate.    Time 12    Period Weeks    Status On-going      PT LONG TERM GOAL #6   Title Increase LE FOTO score by at least 10 points to demo improvement in functional status.    Baseline 59/100 on 02-19-21    Time 12    Period Weeks    Status On-going                   Plan - 03/30/21 0847     Clinical Impression Statement Pt is improving with coordination in RLE, as well as with gait and balance.  Pt's gait was improved with use of 2# dumb bells held in each hand, with increased arm swing noted with pt holding weights.  Less ataxia was noted with use of 2# weight on RLE; pt does continue to need cues to internally rotate Rt foot in stance and to increase step length.  Pt progressing well towards goals.    Personal Factors  and Comorbidities Comorbidity 2;Past/Current Experience;Transportation    Comorbidities migraines, epilepsy with seizure disorder, DM, CAD, s/p cataract sx on bil. eyes, memory deficit    Examination-Activity Limitations Bathing;Stand;Transfers;Carry;Squat;Stairs;Locomotion Level;Hygiene/Grooming;Bend    Examination-Participation Restrictions Cleaning;Community Activity;Driving;Laundry;Shop;Meal Prep    Stability/Clinical Decision Making Evolving/Moderate complexity    Rehab Potential Good    PT Frequency 2x / week    PT Duration 12 weeks    PT Treatment/Interventions ADLs/Self Care Home Management;Aquatic Therapy;DME Instruction;Gait training;Stair training;Therapeutic activities;Therapeutic exercise;Balance training;Neuromuscular re-education;Patient/family education    PT Next Visit Plan Cont gait training without RW, balance and coordination exercises; RLE strengthening    PT Home Exercise Plan balance HEP issued on 03-12-21    Consulted and Agree with Plan of Care Patient             Patient will benefit from skilled therapeutic intervention in order to improve the following deficits and impairments:   Abnormal gait, Decreased activity tolerance, Decreased endurance, Decreased balance, Decreased coordination, Decreased strength, Impaired UE functional use  Visit Diagnosis: Other lack of coordination  Other abnormalities of gait and mobility  Unsteadiness on feet     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[place]) 03/09/2021   Cerebellar stroke ([place]) 01/30/2021   Acute cerebrovascular accident (CVA) ([place]) 01/20/2021   Acute CVA (cerebrovascular accident) ([place]) 01/20/2021   GERD (gastroesophageal reflux disease)    Coronary artery disease    Depression 01/06/2020   Pneumonia 01/05/2020   History of chest pain 09/26/2018   Dyspnea on exertion 09/08/2018   Chest pain, rule out acute myocardial infarction 10/30/2017   Edema 01/25/2017   Tobacco abuse 06/10/2016   Headache 09/15/2015   OSA (obstructive sleep apnea) 07/30/2015   Nephrolithiasis 07/30/2015   Vasovagal syncope 07/30/2015   Carotid artery stenosis, asymptomatic 07/30/2015   CAD (coronary artery disease) 08/22/2013   Essential hypertension 08/22/2013   Diabetes mellitus without complication ([place])    Dyslipidemia    Seizures ([place])    Generalized convulsive epilepsy ([employer]) 03/22/2013   Memory deficit 03/22/2013    Alda Lea, PT 03/30/2021, 7:58 PM  [facility] [address] [place], [place], [address] Phone: [phone]   Fax:  [phone]  Name: Samantha Huffman MRN: [id number] Date of Birth: [birth year]

## 2021-04-01 ENCOUNTER — Other Ambulatory Visit: Payer: Self-pay

## 2021-04-01 ENCOUNTER — Ambulatory Visit: Payer: Medicare HMO | Admitting: Physical Therapy

## 2021-04-01 ENCOUNTER — Ambulatory Visit: Payer: Medicare HMO

## 2021-04-01 ENCOUNTER — Ambulatory Visit: Payer: Medicare HMO | Admitting: Occupational Therapy

## 2021-04-01 ENCOUNTER — Encounter: Payer: Self-pay | Admitting: Physical Therapy

## 2021-04-01 DIAGNOSIS — I69318 Other symptoms and signs involving cognitive functions following cerebral infarction: Secondary | ICD-10-CM

## 2021-04-01 DIAGNOSIS — M6281 Muscle weakness (generalized): Secondary | ICD-10-CM

## 2021-04-01 DIAGNOSIS — R270 Ataxia, unspecified: Secondary | ICD-10-CM

## 2021-04-01 DIAGNOSIS — R4701 Aphasia: Secondary | ICD-10-CM

## 2021-04-01 DIAGNOSIS — R2681 Unsteadiness on feet: Secondary | ICD-10-CM

## 2021-04-01 DIAGNOSIS — R278 Other lack of coordination: Secondary | ICD-10-CM

## 2021-04-01 DIAGNOSIS — R41840 Attention and concentration deficit: Secondary | ICD-10-CM

## 2021-04-01 DIAGNOSIS — R41842 Visuospatial deficit: Secondary | ICD-10-CM

## 2021-04-01 DIAGNOSIS — R2689 Other abnormalities of gait and mobility: Secondary | ICD-10-CM | POA: Diagnosis not present

## 2021-04-01 DIAGNOSIS — R41841 Cognitive communication deficit: Secondary | ICD-10-CM

## 2021-04-01 DIAGNOSIS — R471 Dysarthria and anarthria: Secondary | ICD-10-CM

## 2021-04-01 NOTE — Therapy (Signed)
[facility] [address] [place], [place], [address] Phone: [phone]   Fax:  [phone]  Physical Therapy Treatment  Patient Details  Name: Samantha Huffman MRN: [id number] Date of Birth: July 07, 1956 Referring Provider (PT): Lauraine Rinne, PA-C   Encounter Date: 04/01/2021   PT End of Session - 04/01/21 0843     Visit Number 9    Number of Visits 25    Date for PT Re-Evaluation 05/15/21    Authorization Type [employer] Medicare    Authorization Time Period 02-19-21 - 05-21-21    Progress Note Due on Visit 10    PT Start Time 0803    PT Stop Time 0843    PT Time Calculation (min) 40 min    Equipment Utilized During Treatment Gait belt    Activity Tolerance Patient tolerated treatment well    Behavior During Therapy [facility] for tasks assessed/performed             Past Medical History:  Diagnosis Date   Carotid artery stenosis    1-39% bilateral stenosis by dopplers 08/2020   Coronary artery disease    a. s/p CABG 10/2012.  cath 05/2016 with moderate LM stenosis, mild LCx and RCA stenosis and occluded LAD with patent LIMA to LAD, free radial to OM.  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   Dyslipidemia    Family history of adverse reaction to anesthesia    mother and sister ponv   Generalized convulsive epilepsy without mention of intractable epilepsy    GERD (gastroesophageal reflux disease)    History of kidney stones    Hypertension    Memory deficit    slight   Migraine    "controlled on daily RX " (06/11/2016)   Nephrolithiasis 07/30/2015   S/p lithotripsy x 3 and right and left ureter stents   OSA (obstructive sleep apnea)    cpap    PONV (postoperative nausea and vomiting)    Seizures ([place])    "controlled w/daily RX; started in 2012; dr thinks they might be from the migraines; last sz was early part of 2016" (06/11/2016)   Type II diabetes mellitus ([place])    metformin   Vasovagal syncope 07/30/2015    2011    Past Surgical History:  Procedure Laterality Date   CARDIAC CATHETERIZATION  2014; 06/11/2016   CARDIAC CATHETERIZATION N/A 06/11/2016   Procedure: Left Heart Cath and Cors/Grafts Angiography;  Surgeon: Sherren Mocha, MD;  Location: [facility];  Service: Cardiovascular;  Laterality: N/A;   CESAREAN SECTION     CORONARY ANGIOPLASTY     CORONARY ARTERY BYPASS GRAFT  March, 2014   LIMA to LAD, left radial to LCx x 2   CYSTOSCOPY W/ URETERAL STENT PLACEMENT Left 01/16/2019   Procedure: CYSTOSCOPY WITH RETROGRADE PYELOGRAM/URETERAL LEFT STENT PLACEMENT;  Surgeon: Ardis Hughs, MD;  Location: [facility];  Service: Urology;  Laterality: Left;   CYSTOSCOPY/URETEROSCOPY/HOLMIUM LASER/STENT PLACEMENT Left 01/24/2019   Procedure: CYSTOSCOPY, LEFT URETEROSCOPY, HOLMIUM LASER, STONE REMOVAL AND STENT EXCHANGE;  Surgeon: Ardis Hughs, MD;  Location: [facility];  Service: Urology;  Laterality: Left;   LITHOTRIPSY  X 3   RIGHT/LEFT HEART CATH AND CORONARY/GRAFT ANGIOGRAPHY N/A 09/08/2018   Procedure: RIGHT/LEFT HEART CATH AND CORONARY/GRAFT ANGIOGRAPHY;  Surgeon: Martinique, Peter M, MD;  Location: [facility];  Service: Cardiovascular;  Laterality: N/A;   TOTAL ABDOMINAL HYSTERECTOMY     ovaries took before hysterectomy   TUBAL LIGATION  URETERAL STENT PLACEMENT      There were no vitals filed for this visit.   Subjective Assessment - 04/01/21 0804     Subjective No changes, no falls.    Pertinent History Carotid artery stenosis, HTN, epilepsy with seizure disorder - last seizure was in early 2016:  migraines, DM, memory deficit, CAD,    Diagnostic tests CT of head and neck:  MRA    Patient Stated Goals Improve balance and walking    Currently in Pain? No/denies                               [facility] Adult PT Treatment/Exercise - 04/01/21 0804       Ambulation/Gait   Ambulation/Gait Yes    Ambulation/Gait Assistance 4: Min guard;5: Supervision     Ambulation/Gait Assistance Details continued to use 2# used on RLE; 2# dumb bells held in each hand. plus additional smaller distances between activities with no AD    Ambulation Distance (Feet) 230 Feet    Assistive device None    Gait Pattern Step-through pattern;Ataxic    Ambulation Surface Level;Indoor                 Balance Exercises - 04/01/21 0001       Balance Exercises: Standing   Standing Eyes Opened Narrow base of support (BOS);Foam/compliant surface;Limitations    Standing Eyes Opened Limitations x10 reps head turns, x10 reps head nods    Standing Eyes Closed Wide (BOA);Foam/compliant surface;3 reps;20 secs;Limitations    Standing Eyes Closed Limitations on blue air ex, intermittent taps to bars for balance    SLS with Vectors Solid surface;Other (comment);Limitations    SLS with Vectors Limitations standing at bottom of stair case - forward and cross body cone tap to 2 colorful floor discs on 6" step alternating between R/L x10 reps and then progressing to 2nd step x10 reps with min guard/min A    Tandem Gait Forward;Retro;Intermittent upper extremity support;Limitations    Tandem Gait Limitations down and back x3 reps in // bars    Step Over Hurdles / Cones stepping over 2 smaller and 2 larger orange hurdles with reciprocal pattern, no UE support, cues for slowed and controlled, min guard at times for balance    Other Standing Exercises standing on blue air ex with UE support; tapping RLE to 2 tall cones and 1 smaller floor bubble with cues for gentle toe tap x6 reps, performed an additional x2 reps with 2# ankle weight on RLE                 PT Short Term Goals - 03/30/21 0848       PT SHORT TERM GOAL #1   Title Improve Berg score from 34/56 to >/= 39/56 for reduced fall risk.    Baseline 03/18/21: 50/56 scored today    Status Achieved      PT SHORT TERM GOAL #2   Title Improve TUG score from 17.32 secs to </= 14.5 secs without use of device to increase  safety with ambulation.    Baseline 03/18/21: 14.41 secs with RW, 15.18 sec's no AD, met with RW not no AD    Status On-going    Target Date 04/17/21      PT SHORT TERM GOAL #3   Title Increase gait velocity from 2.25 ft/sec without device to >/= 2.6 ft/sec with CGA for incr. gait efficiency.    Baseline  27/27/22: 2.41 ft/sec with RW with supervision, 2.65 ft/sec no AD with min guard assist, met with no AD    Status Achieved      PT SHORT TERM GOAL #4   Title Assess DGI and establish goal as appropriate. Updated STG:  Increase DGI score from 13/24 to >/= 18/24 to reduce fall risk.    Baseline 03/20/21: 13/24 scored today as baseline, baseline added to LTG    Status New    Target Date 04/17/21      PT SHORT TERM GOAL #5   Title Pt wil amb. 350' with CGA on flat, even surface without LOB.; Updated STG;  Amb. 350' with supervision without use of RW, including amb. to all therapies without RW without LOB.    Baseline 03/18/21: met in session with RW    Status New    Target Date 04/17/21      PT SHORT TERM GOAL #6   Title Independent in HEP for balance and strengthening exercises.    Baseline 03/18/21: met with current HEP per pt report    Status Achieved    Target Date 03/20/21               PT Long Term Goals - 03/23/21 1955       PT LONG TERM GOAL #1   Title Improve Berg score from 34/56 to >/= 50/56 to reduce fall risk.. (all LTGs due 05/22/21)    Baseline 34/56 on 02-19-21    Time 12    Period Weeks    Status On-going      PT LONG TERM GOAL #2   Title Pt will ambulate 1000' modified independently without device on even and uneven surfaces.    Time 12    Period Weeks    Status On-going      PT LONG TERM GOAL #3   Title Improve TUG score from 17.32 secs to </= 13 secs to reduce fall risk.    Baseline 17.32 secs on 02-19-21 with CGA    Time 12    Period Weeks    Status On-going      PT LONG TERM GOAL #4   Title Improve DGI by at least 10 points to increase safety with  gait.    Baseline 03/20/21: 13/24    Time 12    Period Weeks    Status On-going      PT LONG TERM GOAL #5   Title Independent in updated HEP as appropriate.    Time 12    Period Weeks    Status On-going      PT LONG TERM GOAL #6   Title Increase LE FOTO score by at least 10 points to demo improvement in functional status.    Baseline 59/100 on 02-19-21    Time 12    Period Weeks    Status On-going                   Plan - 04/01/21 0844     Clinical Impression Statement Continued to use 2# ankle weight on RLE during gait and with use of 2# dumb bells (able to progress to an additional lap using these today) with improvements in ataxic gait. Remainder of session focused on balance strategies on compliant surfaces, RLE coordination, and SLS tasks. Pt tolerated session well, needing intermittent cues for pursed lip breathing between activities. Will continue to progress towards LTGs.    Personal Factors and Comorbidities Comorbidity 2;Past/Current Experience;Transportation    Comorbidities  migraines, epilepsy with seizure disorder, DM, CAD, s/p cataract sx on bil. eyes, memory deficit    Examination-Activity Limitations Bathing;Stand;Transfers;Carry;Squat;Stairs;Locomotion Level;Hygiene/Grooming;Bend    Examination-Participation Restrictions Cleaning;Community Activity;Driving;Laundry;Shop;Meal Prep    Stability/Clinical Decision Making Evolving/Moderate complexity    Rehab Potential Good    PT Frequency 2x / week    PT Duration 12 weeks    PT Treatment/Interventions ADLs/Self Care Home Management;Aquatic Therapy;DME Instruction;Gait training;Stair training;Therapeutic activities;Therapeutic exercise;Balance training;Neuromuscular re-education;Patient/family education    PT Next Visit Plan will need 10th visit PN. Cont gait training without RW, balance and coordination exercises; RLE strengthening    PT Home Exercise Plan balance HEP issued on 03-12-21    Consulted and Agree with  Plan of Care Patient             Patient will benefit from skilled therapeutic intervention in order to improve the following deficits and impairments:  Abnormal gait, Decreased activity tolerance, Decreased endurance, Decreased balance, Decreased coordination, Decreased strength, Impaired UE functional use  Visit Diagnosis: Muscle weakness (generalized)  Other abnormalities of gait and mobility  Unsteadiness on feet     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[place]) 03/09/2021   Cerebellar stroke ([place]) 01/30/2021   Acute cerebrovascular accident (CVA) ([place]) 01/20/2021   Acute CVA (cerebrovascular accident) ([place]) 01/20/2021   GERD (gastroesophageal reflux disease)    Coronary artery disease    Depression 01/06/2020   Pneumonia 01/05/2020   History of chest pain 09/26/2018   Dyspnea on exertion 09/08/2018   Chest pain, rule out acute myocardial infarction 10/30/2017   Edema 01/25/2017   Tobacco abuse 06/10/2016   Headache 09/15/2015   OSA (obstructive sleep apnea) 07/30/2015   Nephrolithiasis 07/30/2015   Vasovagal syncope 07/30/2015   Carotid artery stenosis, asymptomatic 07/30/2015   CAD (coronary artery disease) 08/22/2013   Essential hypertension 08/22/2013   Diabetes mellitus without complication ([place])    Dyslipidemia    Seizures ([place])    Generalized convulsive epilepsy ([place]) 03/22/2013   Memory deficit 03/22/2013    Arliss Journey, PT, DPT  04/01/2021, 8:46 AM  [facility] [address] [place], [place], [address] Phone: [phone]   Fax:  [phone]  Name: Samantha Huffman MRN: [id number] Date of Birth: [birth year]

## 2021-04-01 NOTE — Patient Instructions (Signed)
VNeST technique:   1) Choose a common verb from the list provided.   2) Think of 3 people (or "subjects") who could perform the action, or start by thinking of 3 objects the action could be done to. It might be easier to work on one complete set at a time, thinking of the object and person/subject together. The goal is to be as specific as possible with the nouns, so "farmer" would be a better subject than "man" for the verb drive. Write each subject and each object down on the paper in the appropriate column so you'll have 3 triads (subject-verb-object). It's okay to get personal. Family members, friends, and pets make great subjects for VNeST! Just try to vary the responses, so not all the nouns are personal or there is just one type of object. Similarly, try to use many different meanings of the verb if possible.   3) Read each triad of words aloud when you write them down on the lines below the chart. It's not important to conjugate the verb or add any articles to the nouns ("farmer drive tractor"), but it's okay if you do ("the farmer drives the tractor").   4) Select one of the three triads to expand upon. Ask "WHERE" it happens, "WHY" it happens, and "WHEN" it happens. Try to get as specific as possible, and write the responses down on the last line following the triad you chose to expand. Then, read the expanded sentence with the three answers to the questions. Again, the grammar doesn't matter as much as the focus is on connecting the concepts.

## 2021-04-01 NOTE — Therapy (Signed)
[facility] [address] [place], [place], [address] Phone: [phone]   Fax:  [phone]  Occupational Therapy Treatment  Patient Details  Name: Samantha Huffman MRN: [id number] Date of Birth: 02-02-1956 Referring Provider (OT): Danella Sensing, NP   Encounter Date: 04/01/2021   OT End of Session - 04/01/21 0900     Visit Number 9    Number of Visits 25    Date for OT Re-Evaluation 06/08/21    Authorization Type [employer] Medicare (recert completed [id number] due to delay in start of OT due to schedule conflicts)    Authorization - Visit Number 9    Authorization - Number of Visits 10    OT Start Time 0848    OT Stop Time 0930    OT Time Calculation (min) 42 min             Past Medical History:  Diagnosis Date   Carotid artery stenosis    1-39% bilateral stenosis by dopplers 08/2020   Coronary artery disease    a. s/p CABG 10/2012.  cath 05/2016 with moderate LM stenosis, mild LCx and RCA stenosis and occluded LAD with patent LIMA to LAD, free radial to OM.  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   Dyslipidemia    Family history of adverse reaction to anesthesia    mother and sister ponv   Generalized convulsive epilepsy without mention of intractable epilepsy    GERD (gastroesophageal reflux disease)    History of kidney stones    Hypertension    Memory deficit    slight   Migraine    "controlled on daily RX " (06/11/2016)   Nephrolithiasis 07/30/2015   S/p lithotripsy x 3 and right and left ureter stents   OSA (obstructive sleep apnea)    cpap    PONV (postoperative nausea and vomiting)    Seizures ([place])    "controlled w/daily RX; started in 2012; dr thinks they might be from the migraines; last sz was early part of 2016" (06/11/2016)   Type II diabetes mellitus ([place])    metformin   Vasovagal syncope 07/30/2015   2011    Past Surgical History:  Procedure Laterality Date   CARDIAC CATHETERIZATION   2014; 06/11/2016   CARDIAC CATHETERIZATION N/A 06/11/2016   Procedure: Left Heart Cath and Cors/Grafts Angiography;  Surgeon: Sherren Mocha, MD;  Location: [facility];  Service: Cardiovascular;  Laterality: N/A;   CESAREAN SECTION     CORONARY ANGIOPLASTY     CORONARY ARTERY BYPASS GRAFT  March, 2014   LIMA to LAD, left radial to LCx x 2   CYSTOSCOPY W/ URETERAL STENT PLACEMENT Left 01/16/2019   Procedure: CYSTOSCOPY WITH RETROGRADE PYELOGRAM/URETERAL LEFT STENT PLACEMENT;  Surgeon: Ardis Hughs, MD;  [facility];  Service: Urology;  Laterality: Left;   CYSTOSCOPY/URETEROSCOPY/HOLMIUM LASER/STENT PLACEMENT Left 01/24/2019   Procedure: CYSTOSCOPY, LEFT URETEROSCOPY, HOLMIUM LASER, STONE REMOVAL AND STENT EXCHANGE;  Surgeon: Ardis Hughs, MD;  [facility];  Service: Urology;  Laterality: Left;   LITHOTRIPSY  X 3   RIGHT/LEFT HEART CATH AND CORONARY/GRAFT ANGIOGRAPHY N/A 09/08/2018   Procedure: RIGHT/LEFT HEART CATH AND CORONARY/GRAFT ANGIOGRAPHY;  Surgeon: Martinique, Peter M, MD;  Location: [facility];  Service: Cardiovascular;  Laterality: N/A;   TOTAL ABDOMINAL HYSTERECTOMY     ovaries took before hysterectomy   TUBAL LIGATION     URETERAL STENT PLACEMENT      There were no vitals filed for  this visit.   Subjective Assessment - 04/01/21 0900     Subjective  denies pain    Pertinent History CVA with R-sided weakness/ataxia.  PMH:  CAD, HTN, DM, OSA, seizure disorder, dyslipidemia, migraine, CABG (2014), recent bilateral cataract surgeries just prior to CVA    Currently in Pain? No/denies                   Treatment: Seated closed chain shoulder flexion and diagonals in seated, min facilitation/ v.c Functional reaching to place large pegs into pegboard with RUE, for mid range functional reach, removing with RUE to perform in hand manipulation, min v.c Drinking from a cup with elbow close to her side, min v.c for RUE  use.               OT Education - 04/01/21 0914     Education Details HEP for weightbearing - see pt instructions    Person(s) Educated Patient    Methods Explanation;Handout;Verbal cues;Demonstration    Comprehension Verbalized understanding;Returned demonstration;Verbal cues required              OT Short Term Goals - 03/30/21 0810       OT SHORT TERM GOAL #1   Title Pt will be independent with initial HEP.--Check initial STGs (#1-4)  04/09/21    Time 4    Period Weeks    Status Achieved      OT SHORT TERM GOAL #2   Title Pt will demo improved coordination and functional reach for ADLs as shown by improving score on box and blocks test by at least 6.    Baseline 42 blocks    Time 4    Period Weeks    Status On-going   03/30/21:  43 blocks     OT SHORT TERM GOAL #3   Title Pt will perform bathing with min A.    Time 4    Period Weeks    Status Achieved   03/30/21:  min A for washing, needs assist for transfer due to prefer bath     OT SHORT TERM GOAL #4   Title Pt will perofrm UB dressing mod I.    Time 4    Period Weeks    Status Achieved      OT SHORT TERM GOAL #5   Title Pt will perform LB dressing with min A.--check STGs #5-8  05/10/21    Time 8    Period Weeks    Status Achieved      OT SHORT TERM GOAL #6   Title Pt will perform bathing with supervision except transfer (pt prefers bath and will need A due to safety, has been provided with info for tub transfer bench).    Time 8    Period Weeks    Status Revised      OT SHORT TERM GOAL #7   Title Pt will peform simple home maintenane task and snack prep mod I.    Time 8    Period Weeks    Status New      OT SHORT TERM GOAL #8   Title Pt will cut food mod I using AE prn.    Time 8    Period Weeks    Status New               OT Long Term Goals - 03/25/21 1051       OT LONG TERM GOAL #1   Title Pt will be independent with  updated HEP--check LTGs 06/08/21    Time 12    Period  Weeks    Status New      OT LONG TERM GOAL #2   Title Pt will perform BADLs mod I with AE/DME and strategies prn.    Time 12    Period Weeks    Status New      OT LONG TERM GOAL #3   Title Pt wil be for memory/cognitive compensation strategies for ADL/IADLs prn.    Time 12    Period Weeks    Status New      OT LONG TERM GOAL #4   Title Pt will perform simple cooking task with supervision.    Time 12    Period Weeks    Status New      OT LONG TERM GOAL #5   Title Pt will perform environmental scanning/navigation with at least 90% accuracy (with head turns) without reports of dizziness/LOB.    Time 12    Period Weeks    Status New      OT LONG TERM GOAL #6   Title Pt will improve RUE coordination as shown by completing 9-hole peg test in less than 36sec without drops.    Time 12    Period Weeks    Status New                   Plan - 04/01/21 0915     Clinical Impression Statement Pt is progressing towards goals with improving RUE control and coordination. Pt demonstrates understanding of HEP for weightbearing.    OT Occupational Profile and History Detailed Assessment- Review of Records and additional review of physical, cognitive, psychosocial history related to current functional performance    Occupational performance deficits (Please refer to evaluation for details): ADL's;IADL's;Leisure;Social Participation    Body Structure / Function / Physical Skills ADL;Decreased knowledge of use of DME;Strength;GMC;Dexterity;Balance;UE functional use;IADL;Endurance;ROM;Vision;Improper spinal/pelvic alignment;Coordination;Mobility;FMC    Cognitive Skills Attention;Memory;Understand    Rehab Potential Good    Clinical Decision Making Several treatment options, min-mod task modification necessary    Comorbidities Affecting Occupational Performance: May have comorbidities impacting occupational performance    Modification or Assistance to Complete Evaluation  Min-Moderate  modification of tasks or assist with assess necessary to complete eval    OT Frequency 2x / week    OT Duration 12 weeks   +eval   OT Treatment/Interventions Self-care/ADL training;Moist Heat;DME and/or AE instruction;Balance training;Aquatic Therapy;Therapeutic activities;Cognitive remediation/compensation;Therapeutic exercise;Cryotherapy;Neuromuscular education;Functional Mobility Training;Passive range of motion;Visual/perceptual remediation/compensation;Patient/family education;Manual Therapy;Energy conservation;Paraffin;Fluidtherapy    Plan reveiw wt. bearing/scapular stabilization HEP, functional reaching, IADL task    Consulted and Agree with Plan of Care Patient             Patient will benefit from skilled therapeutic intervention in order to improve the following deficits and impairments:   Body Structure / Function / Physical Skills: ADL, Decreased knowledge of use of DME, Strength, GMC, Dexterity, Balance, UE functional use, IADL, Endurance, ROM, Vision, Improper spinal/pelvic alignment, Coordination, Mobility, FMC Cognitive Skills: Attention, Memory, Understand     Visit Diagnosis: Muscle weakness (generalized)  Visuospatial deficit  Attention and concentration deficit  Other symptoms and signs involving cognitive functions following cerebral infarction  Other lack of coordination  Unsteadiness on feet  Ataxia    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[place]) 03/09/2021   Cerebellar stroke ([place]) 01/30/2021   Acute cerebrovascular accident (CVA) ([place]) 01/20/2021  Acute CVA (cerebrovascular accident) ([place]) 01/20/2021   GERD (gastroesophageal reflux disease)    Coronary artery disease    Depression 01/06/2020   Pneumonia 01/05/2020   History of chest pain 09/26/2018   Dyspnea on exertion 09/08/2018   Chest pain, rule out acute myocardial infarction 10/30/2017   Edema 01/25/2017   Tobacco abuse 06/10/2016    Headache 09/15/2015   OSA (obstructive sleep apnea) 07/30/2015   Nephrolithiasis 07/30/2015   Vasovagal syncope 07/30/2015   Carotid artery stenosis, asymptomatic 07/30/2015   CAD (coronary artery disease) 08/22/2013   Essential hypertension 08/22/2013   Diabetes mellitus without complication ([place])    Dyslipidemia    Seizures ([place])    Generalized convulsive epilepsy ([place]) 03/22/2013   Memory deficit 03/22/2013    Samantha Huffman 04/01/2021, 9:17 AM  [facility] [address] [place], [place], [address] Phone: [phone]   Fax:  [phone]  Name: Samantha Huffman MRN: [id number] Date of Birth: March 05, 1956

## 2021-04-01 NOTE — Patient Instructions (Signed)
Lateral Weight Shift: Upper Trunk Leading   Sit with feet flat on floor. Bring _right___ shoulder, head and arm toward side until forearm/elbow just touches sitting surface. Hold __10__ seconds. Return to upright position by pushing through arm Repeat _10___ times per session. Do __2__ sessions per day.   FWeight Bearing (Standing)    Rest palms comfortably on table, or at sink  gently lean rock forwards and backwards. Hold ___5_ seconds. Repeat ___10_ times. Do __2__ sessions per day. Have your dtr with you initally  Copyright  VHI. All rights reserved.   Marland Kitchen

## 2021-04-02 NOTE — Therapy (Signed)
[facility] [address] [place], [place], [address] Phone: [phone]   Fax:  [phone]  Speech Language Pathology Treatment  Patient Details  Name: Samantha Huffman MRN: [id number] Date of Birth: September 08, 1955 Referring Provider (SLP): Lauraine Rinne, [place]   Encounter Date: 04/01/2021   End of Session - 04/02/21 0027     Visit Number 9    Number of Visits 25    Date for SLP Re-Evaluation 05/20/21    SLP Start Time 0935    SLP Stop Time  0277    SLP Time Calculation (min) 40 min    Activity Tolerance Patient tolerated treatment well             Past Medical History:  Diagnosis Date   Carotid artery stenosis    1-39% bilateral stenosis by dopplers 08/2020   Coronary artery disease    a. s/p CABG 10/2012.  cath 05/2016 with moderate LM stenosis, mild LCx and RCA stenosis and occluded LAD with patent LIMA to LAD, free radial to OM.  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   Dyslipidemia    Family history of adverse reaction to anesthesia    mother and sister ponv   Generalized convulsive epilepsy without mention of intractable epilepsy    GERD (gastroesophageal reflux disease)    History of kidney stones    Hypertension    Memory deficit    slight   Migraine    "controlled on daily RX " (06/11/2016)   Nephrolithiasis 07/30/2015   S/p lithotripsy x 3 and right and left ureter stents   OSA (obstructive sleep apnea)    cpap    PONV (postoperative nausea and vomiting)    Seizures ([facility])    "controlled w/daily RX; started in 2012; dr thinks they might be from the migraines; last sz was early part of 2016" (06/11/2016)   Type II diabetes mellitus ([place])    metformin   Vasovagal syncope 07/30/2015   2011    Past Surgical History:  Procedure Laterality Date   CARDIAC CATHETERIZATION  2014; 06/11/2016   CARDIAC CATHETERIZATION N/A 06/11/2016   Procedure: Left Heart Cath and Cors/Grafts Angiography;  Surgeon:  Sherren Mocha, MD;  Location: [facility];  Service: Cardiovascular;  Laterality: N/A;   CESAREAN SECTION     CORONARY ANGIOPLASTY     CORONARY ARTERY BYPASS GRAFT  March, 2014   LIMA to LAD, left radial to LCx x 2   CYSTOSCOPY W/ URETERAL STENT PLACEMENT Left 01/16/2019   Procedure: CYSTOSCOPY WITH RETROGRADE PYELOGRAM/URETERAL LEFT STENT PLACEMENT;  Surgeon: Ardis Hughs, MD;  Location: [facility];  Service: Urology;  Laterality: Left;   CYSTOSCOPY/URETEROSCOPY/HOLMIUM LASER/STENT PLACEMENT Left 01/24/2019   Procedure: CYSTOSCOPY, LEFT URETEROSCOPY, HOLMIUM LASER, STONE REMOVAL AND STENT EXCHANGE;  Surgeon: Ardis Hughs, MD;  Location: [facility];  Service: Urology;  Laterality: Left;   LITHOTRIPSY  X 3   RIGHT/LEFT HEART CATH AND CORONARY/GRAFT ANGIOGRAPHY N/A 09/08/2018   Procedure: RIGHT/LEFT HEART CATH AND CORONARY/GRAFT ANGIOGRAPHY;  Surgeon: Martinique, Peter M, MD;  Location: [facility];  Service: Cardiovascular;  Laterality: N/A;   TOTAL ABDOMINAL HYSTERECTOMY     ovaries took before hysterectomy   TUBAL LIGATION     URETERAL STENT PLACEMENT      There were no vitals filed for this visit.   Subjective Assessment - 04/01/21 0944     Subjective "I'm so happy my voice is turning back how it is supposed to be."  Currently in Pain? No/denies                   ADULT SLP TREATMENT - 04/02/21 0001       General Information   Behavior/Cognition Alert;Pleasant mood;Cooperative;Distractible      Treatment Provided   Treatment provided Cognitive-Linquistic      Cognitive-Linquistic Treatment   Treatment focused on Aphasia;Dysarthria;Cognition    Skilled Treatment Pt entered with WNL voice. SLP taught pt about verb Network Strengthening Technique (VNeST). SLP provided handout with instructions for family to perfrom with pt.      Assessment / Recommendations / Plan   Plan Continue with current plan of care      Progression Toward Goals   Progression  toward goals Progressing toward goals              SLP Education - 04/02/21 0027     Education Details verb network strengthening treatment    Person(s) Educated Patient    Methods Demonstration;Explanation;Verbal cues;Handout    Comprehension Verbalized understanding;Returned demonstration;Verbal cues required;Need further instruction              SLP Short Term Goals - 03/30/21 [place] #1   Title pt will engage in cognitive linguistic and speech strength/coordination testing and goals added as necessary    Status Achieved    Target Date 03/05/21      SLP SHORT TERM GOAL #2   Title using compensations, Shalicia will remind family when she needs to take meds (pt is now dependent upon family) 80% of opportunities between 3 sessions    Status Not Met    Target Date 03/20/21      SLP SHORT TERM GOAL #3   Title pt will name 10 items in a simple category with rare verbal cues x3 sessions    Baseline 03-23-21    Status Not Met    Target Date 03/20/21      SLP SHORT TERM GOAL #4   Title pt will independently use anomia compensations in 5 minutes simple conversation x3 sessions    Baseline 03-12-21, 03-23-21    Status Not Met    Target Date 03/20/21      SLP SHORT TERM GOAL #5   Title pt will exhibit 100% intelligibility in 5 minutes simple conversation using speech compensations, x3 sessions    Status Partially Met    Target Date 03/20/21      SLP SHORT TERM GOAL #6   Title Samanthan will complete a speech HEP with rare min A x2 sessions    Status Not Met    Target Date 03/20/21      SLP SHORT TERM GOAL #7   Title Pt will verbalize and demonstrate 3 attention/memory strategies to aid functioning home with min A over 2 sessions    Status Not Met    Target Date 03/20/21              SLP Long Term Goals - 04/02/21 0029       SLP LONG TERM GOAL #1   Title pt will complete speech HEP with occasional min A x3 sessions    Time 6    Period Weeks     Status Revised    Target Date 04/17/21      SLP LONG TERM GOAL #2   Title in 7 minutes simple conversation pt will exhibit functional fluid speech using compensations in 2 sessions  Time 6    Period Weeks    Status Revised    Target Date 04/17/21      SLP LONG TERM GOAL #3   Title in 8 minutes mod complex conversation pt will exhibit functional fluid speech using compensations in 2 sessions    Time 10    Period Weeks    Status Revised    Target Date 05/15/21      SLP LONG TERM GOAL #4   Title pt will complete VNeST and SFA tasks with occasoinal min A in 3 sessions    Time 6    Period Weeks    Status Revised    Target Date 05/15/21      SLP LONG TERM GOAL #5   Title Kimbley will exhibit functional word-finding using compensations in 8 mintues mod complex conversation in 3 sessions    Baseline 03-30-21    Time 10    Period Weeks    Status Revised    Target Date 05/15/21      SLP LONG TERM GOAL #6   Title Yesica will independently take meds for 10 days as reported by Blanch Media or family    Time 10    Period Weeks    Status Revised    Target Date 05/15/21      SLP LONG TERM GOAL #7   Title Pt will verbalize and demonstrate 3 attention/memory strategies to aid functioning at work and home with rare min A over 2 sessions    Time 10    Period Weeks    Status Revised    Target Date 05/15/21              Plan - 04/02/21 0028     Clinical Impression Statement 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 At this time, she is still dependent on family and friends, however they also require training for activities that Ramona can safely do and to help her progress to more independence. Today SLP assisted pt with Verb Network Strengthening Treatment for family to assist pt at home. Continue skilled ST to maximize cognition, languae and intelligibility for safety, independence and to reduce caregiver burden.    Speech Therapy Frequency 2x / week    Duration 12 weeks     Treatment/Interventions Language facilitation;Cueing hierarchy;SLP instruction and feedback;Cognitive reorganization;Compensatory strategies;Internal/external aids;Patient/family education;Functional tasks;Environmental controls;Oral motor exercises    Potential to Achieve Goals Good    SLP Home Exercise Plan provided    Consulted and Agree with Plan of Care Patient             Patient will benefit from skilled therapeutic intervention in order to improve the following deficits and impairments:   Aphasia  Cognitive communication deficit  Dysarthria and anarthria    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[place]) 03/09/2021   Cerebellar stroke ([facility]) 01/30/2021   Acute cerebrovascular accident (CVA) ([facility]) 01/20/2021   Acute CVA (cerebrovascular accident) ([place]) 01/20/2021   GERD (gastroesophageal reflux disease)    Coronary artery disease    Depression 01/06/2020   Pneumonia 01/05/2020   History of chest pain 09/26/2018   Dyspnea on exertion 09/08/2018   Chest pain, rule out acute myocardial infarction 10/30/2017   Edema 01/25/2017   Tobacco abuse 06/10/2016   Headache 09/15/2015   OSA (obstructive sleep apnea) 07/30/2015   Nephrolithiasis 07/30/2015   Vasovagal syncope 07/30/2015   Carotid artery stenosis, asymptomatic 07/30/2015   CAD (coronary artery  disease) 08/22/2013   Essential hypertension 08/22/2013   Diabetes mellitus without complication ([place])    Dyslipidemia    Seizures ([facility])    Generalized convulsive epilepsy ([place]) 03/22/2013   Memory deficit 03/22/2013    [facility] ,[place], [place]  04/02/2021, 12:30 AM  [facility] [address] [place], [place], [address] Phone: [phone]   Fax:  [phone]   Name: Savannaha Stonerock MRN: [id number] Date of Birth: 27-Jun-1956

## 2021-04-06 ENCOUNTER — Ambulatory Visit: Payer: Medicare HMO | Admitting: Physical Therapy

## 2021-04-06 ENCOUNTER — Other Ambulatory Visit: Payer: Self-pay

## 2021-04-06 ENCOUNTER — Ambulatory Visit: Payer: Medicare HMO

## 2021-04-06 ENCOUNTER — Ambulatory Visit: Payer: Medicare HMO | Admitting: Occupational Therapy

## 2021-04-06 ENCOUNTER — Encounter: Payer: Self-pay | Admitting: Physical Therapy

## 2021-04-06 DIAGNOSIS — R278 Other lack of coordination: Secondary | ICD-10-CM

## 2021-04-06 DIAGNOSIS — R2689 Other abnormalities of gait and mobility: Secondary | ICD-10-CM

## 2021-04-06 DIAGNOSIS — R41840 Attention and concentration deficit: Secondary | ICD-10-CM

## 2021-04-06 DIAGNOSIS — M6281 Muscle weakness (generalized): Secondary | ICD-10-CM

## 2021-04-06 DIAGNOSIS — R41842 Visuospatial deficit: Secondary | ICD-10-CM

## 2021-04-06 DIAGNOSIS — R270 Ataxia, unspecified: Secondary | ICD-10-CM

## 2021-04-06 DIAGNOSIS — R2681 Unsteadiness on feet: Secondary | ICD-10-CM

## 2021-04-06 DIAGNOSIS — R41841 Cognitive communication deficit: Secondary | ICD-10-CM

## 2021-04-06 DIAGNOSIS — R4701 Aphasia: Secondary | ICD-10-CM

## 2021-04-06 DIAGNOSIS — I69318 Other symptoms and signs involving cognitive functions following cerebral infarction: Secondary | ICD-10-CM

## 2021-04-06 DIAGNOSIS — R471 Dysarthria and anarthria: Secondary | ICD-10-CM

## 2021-04-06 NOTE — Therapy (Signed)
[facility] [address] [place], [place], [address] Phone: [phone]   Fax:  [phone]  Speech Language Pathology Treatment/ Medicare progress note  Patient Details  Name: Samantha Huffman MRN: [id number] Date of Birth: Dec 02, 1955 Referring Provider (SLP): Lauraine Rinne, [place]   Encounter Date: 04/06/2021   End of Session - 04/06/21 1307     Visit Number 10    Number of Visits 25    Date for SLP Re-Evaluation 05/20/21    SLP Start Time 0932    SLP Stop Time  4650    SLP Time Calculation (min) 43 min    Activity Tolerance Patient tolerated treatment well             Past Medical History:  Diagnosis Date   Carotid artery stenosis    1-39% bilateral stenosis by dopplers 08/2020   Coronary artery disease    a. s/p CABG 10/2012.  cath 05/2016 with moderate LM stenosis, mild LCx and RCA stenosis and occluded LAD with patent LIMA to LAD, free radial to OM.  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   Dyslipidemia    Family history of adverse reaction to anesthesia    mother and sister ponv   Generalized convulsive epilepsy without mention of intractable epilepsy    GERD (gastroesophageal reflux disease)    History of kidney stones    Hypertension    Memory deficit    slight   Migraine    "controlled on daily RX " (06/11/2016)   Nephrolithiasis 07/30/2015   S/p lithotripsy x 3 and right and left ureter stents   OSA (obstructive sleep apnea)    cpap    PONV (postoperative nausea and vomiting)    Seizures ([facility])    "controlled w/daily RX; started in 2012; dr thinks they might be from the migraines; last sz was early part of 2016" (06/11/2016)   Type II diabetes mellitus ([facility] [place])    metformin   Vasovagal syncope 07/30/2015   2011    Past Surgical History:  Procedure Laterality Date   CARDIAC CATHETERIZATION  2014; 06/11/2016   CARDIAC CATHETERIZATION N/A 06/11/2016   Procedure: Left Heart Cath and Cors/Grafts  Angiography;  Surgeon: Sherren Mocha, MD;  Location: [facility];  Service: Cardiovascular;  Laterality: N/A;   CESAREAN SECTION     CORONARY ANGIOPLASTY     CORONARY ARTERY BYPASS GRAFT  March, 2014   LIMA to LAD, left radial to LCx x 2   CYSTOSCOPY W/ URETERAL STENT PLACEMENT Left 01/16/2019   Procedure: CYSTOSCOPY WITH RETROGRADE PYELOGRAM/URETERAL LEFT STENT PLACEMENT;  Surgeon: Ardis Hughs, MD;  [facility];  Service: Urology;  Laterality: Left;   CYSTOSCOPY/URETEROSCOPY/HOLMIUM LASER/STENT PLACEMENT Left 01/24/2019   Procedure: CYSTOSCOPY, LEFT URETEROSCOPY, HOLMIUM LASER, STONE REMOVAL AND STENT EXCHANGE;  Surgeon: Ardis Hughs, MD;  Location: [facility];  Service: Urology;  Laterality: Left;   LITHOTRIPSY  X 3   RIGHT/LEFT HEART CATH AND CORONARY/GRAFT ANGIOGRAPHY N/A 09/08/2018   Procedure: RIGHT/LEFT HEART CATH AND CORONARY/GRAFT ANGIOGRAPHY;  Surgeon: Martinique, Peter M, MD;  Location: [facility];  Service: Cardiovascular;  Laterality: N/A;   TOTAL ABDOMINAL HYSTERECTOMY     ovaries took before hysterectomy   TUBAL LIGATION     URETERAL STENT PLACEMENT      There were no vitals filed for this visit.   Speech Therapy Progress Note  Dates of Reporting Period: 02-19-21 to present  Subjective Statement: Pt has been seen for 10  sessions OPST.  Objective: See below  Goal Update: See below  Plan: See below.  Reason Skilled Services are Required: Pt has not maxed her rehab potential.     Subjective Assessment - 04/06/21 0940     Subjective "I come back and see you Wednesday adn then my [employer]) nurse comes out Friday."    Currently in Pain? No/denies                   ADULT SLP TREATMENT - 04/06/21 0941       General Information   Behavior/Cognition Alert;Pleasant mood;Cooperative;Distractible      Treatment Provided   Treatment provided Cognitive-Linquistic      Cognitive-Linquistic Treatment   Treatment focused on  Aphasia;Dysarthria;Cognition    Skilled Treatment Pt and SLP worked through her VNeST papers she did over the weekend as she did not expand any in the correct way. Pt req'd min verbal A consisently to do so correctly, for combining the answers to the questions into her expanded sentence. Pt and SLP spoke fo r15 mintues in min-mod complex conversation and pt with anomia x1.      Assessment / Recommendations / Plan   Plan Continue with current plan of care      Progression Toward Goals   Progression toward goals Progressing toward goals                SLP Short Term Goals - 03/30/21 1140       SLP SHORT TERM GOAL #1   Title pt will engage in cognitive linguistic and speech strength/coordination testing and goals added as necessary    Status Achieved    Target Date 03/05/21      SLP SHORT TERM GOAL #2   Title using compensations, Arturo will remind family when she needs to take meds (pt is now dependent upon family) 80% of opportunities between 3 sessions    Status Not Met    Target Date 03/20/21      SLP SHORT TERM GOAL #3   Title pt will name 10 items in a simple category with rare verbal cues x3 sessions    Baseline 03-23-21    Status Not Met    Target Date 03/20/21      SLP SHORT TERM GOAL #4   Title pt will independently use anomia compensations in 5 minutes simple conversation x3 sessions    Baseline 03-12-21, 03-23-21    Status Not Met    Target Date 03/20/21      SLP SHORT TERM GOAL #5   Title pt will exhibit 100% intelligibility in 5 minutes simple conversation using speech compensations, x3 sessions    Status Partially Met    Target Date 03/20/21      SLP SHORT TERM GOAL #6   Title Alliyah will complete a speech HEP with rare min A x2 sessions    Status Not Met    Target Date 03/20/21      SLP SHORT TERM GOAL #7   Title Pt will verbalize and demonstrate 3 attention/memory strategies to aid functioning home with min A over 2 sessions    Status Not Met    Target  Date 03/20/21              SLP Long Term Goals - 04/06/21 1309       SLP LONG TERM GOAL #1   Title pt will complete speech HEP with occasional min A x3 sessions    Time 6  Period Weeks    Status Revised      SLP LONG TERM GOAL #2   Title in 7 minutes simple conversation pt will exhibit functional fluid speech using compensations in 2 sessions    Time 6    Period Weeks    Status Revised      SLP LONG TERM GOAL #3   Title in 8 minutes mod complex conversation pt will exhibit functional fluid speech using compensations in 2 sessions    Time 10    Period Weeks    Status Revised      SLP LONG TERM GOAL #4   Title pt will complete VNeST and SFA tasks with occasoinal min A in 3 sessions    Time 6    Period Weeks    Status Revised      SLP LONG TERM GOAL #5   Title Rondell will exhibit functional word-finding using compensations in 8 mintues mod complex conversation in 3 sessions    Baseline 03-30-21    Time 10    Period Weeks    Status Revised      SLP LONG TERM GOAL #6   Title Taelar will independently take meds for 10 days as reported by Blanch Media or family    Time 10    Period Weeks    Status Revised      SLP LONG TERM GOAL #7   Title Pt will verbalize and demonstrate 3 attention/memory strategies to aid functioning at work and home with rare min A over 2 sessions    Time 10    Period Weeks    Status Revised              Plan - 04/06/21 1308     Clinical Impression Statement 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 At this time, she is still dependent on family and friends, however they also require training for activities that Cheyene can safely do and to help her progress to more independence. Today SLP assisted pt with Oncologist Treatment for family to assist pt at home, as pt's VNeST wer not compeltely correct (expanded sentences). Continue skilled ST to maximize cognition, languae and intelligibility for safety, independence and to  reduce caregiver burden.    Speech Therapy Frequency 2x / week    Duration 12 weeks    Treatment/Interventions Language facilitation;Cueing hierarchy;SLP instruction and feedback;Cognitive reorganization;Compensatory strategies;Internal/external aids;Patient/family education;Functional tasks;Environmental controls;Oral motor exercises    Potential to Achieve Goals Good    SLP Home Exercise Plan provided    Consulted and Agree with Plan of Care Patient             Patient will benefit from skilled therapeutic intervention in order to improve the following deficits and impairments:   Aphasia  Dysarthria and anarthria  Cognitive communication deficit    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/09/2021   Cerebellar stroke ([place]) 01/30/2021   Acute cerebrovascular accident (CVA) ([place]) 01/20/2021   Acute CVA (cerebrovascular accident) ([facility]) 01/20/2021   GERD (gastroesophageal reflux disease)    Coronary artery disease    Depression 01/06/2020   Pneumonia 01/05/2020   History of chest pain 09/26/2018   Dyspnea on exertion 09/08/2018   Chest pain, rule out acute myocardial infarction 10/30/2017   Edema 01/25/2017   Tobacco abuse 06/10/2016   Headache 09/15/2015   OSA (obstructive sleep apnea) 07/30/2015   Nephrolithiasis 07/30/2015   Vasovagal syncope 07/30/2015  Carotid artery stenosis, asymptomatic 07/30/2015   CAD (coronary artery disease) 08/22/2013   Essential hypertension 08/22/2013   Diabetes mellitus without complication ([place])    Dyslipidemia    Seizures ([place])    Generalized convulsive epilepsy ([place]) 03/22/2013   Memory deficit 03/22/2013    [facility] ,[place], [place]  04/06/2021, 1:09 PM  [facility] [address] [place], [place], [address] Phone: [phone]   Fax:  [phone]   Name: Tunisia Landgrebe MRN: [id number] Date of Birth:  30-Nov-1955

## 2021-04-06 NOTE — Therapy (Signed)
[facility] [address] [place], [place], [address] Phone: [phone]   Fax:  [phone]  Physical Therapy Treatment & 10th visit progress note   Reporting Period:  02-19-21 - 04-06-21 See below for progress towards goals  Patient Details  Name: Samantha Huffman MRN: [id number] Date of Birth: 1956/04/03 Referring Provider (PT): Lauraine Rinne, PA-C   Encounter Date: 04/06/2021   PT End of Session - 04/06/21 1904     Visit Number 10    Number of Visits 25    Date for PT Re-Evaluation 05/15/21    Authorization Type [employer] Medicare    Authorization Time Period 02-19-21 - 05-21-21    Progress Note Due on Visit 10    PT Start Time 0847    PT Stop Time 0931    PT Time Calculation (min) 44 min    Equipment Utilized During Treatment Gait belt    Activity Tolerance Patient tolerated treatment well    Behavior During Therapy [facility] for tasks assessed/performed             Past Medical History:  Diagnosis Date   Carotid artery stenosis    1-39% bilateral stenosis by dopplers 08/2020   Coronary artery disease    a. s/p CABG 10/2012.  cath 05/2016 with moderate LM stenosis, mild LCx and RCA stenosis and occluded LAD with patent LIMA to LAD, free radial to OM.  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   Dyslipidemia    Family history of adverse reaction to anesthesia    mother and sister ponv   Generalized convulsive epilepsy without mention of intractable epilepsy    GERD (gastroesophageal reflux disease)    History of kidney stones    Hypertension    Memory deficit    slight   Migraine    "controlled on daily RX " (06/11/2016)   Nephrolithiasis 07/30/2015   S/p lithotripsy x 3 and right and left ureter stents   OSA (obstructive sleep apnea)    cpap    PONV (postoperative nausea and vomiting)    Seizures ([place])    "controlled w/daily RX; started in 2012; dr thinks they might be from the migraines; last sz was early  part of 2016" (06/11/2016)   Type II diabetes mellitus ([place])    metformin   Vasovagal syncope 07/30/2015   2011    Past Surgical History:  Procedure Laterality Date   CARDIAC CATHETERIZATION  2014; 06/11/2016   CARDIAC CATHETERIZATION N/A 06/11/2016   Procedure: Left Heart Cath and Cors/Grafts Angiography;  Surgeon: Sherren Mocha, MD;  Location: [facility];  Service: Cardiovascular;  Laterality: N/A;   CESAREAN SECTION     CORONARY ANGIOPLASTY     CORONARY ARTERY BYPASS GRAFT  March, 2014   LIMA to LAD, left radial to LCx x 2   CYSTOSCOPY W/ URETERAL STENT PLACEMENT Left 01/16/2019   Procedure: CYSTOSCOPY WITH RETROGRADE PYELOGRAM/URETERAL LEFT STENT PLACEMENT;  Surgeon: Ardis Hughs, MD;  Location: [facility];  Service: Urology;  Laterality: Left;   CYSTOSCOPY/URETEROSCOPY/HOLMIUM LASER/STENT PLACEMENT Left 01/24/2019   Procedure: CYSTOSCOPY, LEFT URETEROSCOPY, HOLMIUM LASER, STONE REMOVAL AND STENT EXCHANGE;  Surgeon: Ardis Hughs, MD;  [facility]: [facility];  Service: Urology;  Laterality: Left;   LITHOTRIPSY  X 3   RIGHT/LEFT HEART CATH AND CORONARY/GRAFT ANGIOGRAPHY N/A 09/08/2018   Procedure: RIGHT/LEFT HEART CATH AND CORONARY/GRAFT ANGIOGRAPHY;  Surgeon: Martinique, Peter M, MD;  Location: [facility];  Service: Cardiovascular;  Laterality: N/A;  TOTAL ABDOMINAL HYSTERECTOMY     ovaries took before hysterectomy   TUBAL LIGATION     URETERAL STENT PLACEMENT      There were no vitals filed for this visit.   Subjective Assessment - 04/06/21 0849     Subjective Pt states she fell going up the steps at home yesterday evening - has a few scrapes but did not get hurt too bad; says her Rt foot didn't clear the step going up    Pertinent History Carotid artery stenosis, HTN, epilepsy with seizure disorder - last seizure was in early 2016:  migraines, DM, memory deficit, CAD,    Diagnostic tests CT of head and neck:  MRA    Patient Stated Goals Improve balance and  walking    Currently in Pain? No/denies                               [facility] Adult PT Treatment/Exercise - 04/06/21 0851       Ambulation/Gait   Ambulation/Gait Yes    Ambulation/Gait Assistance 4: Min guard    Ambulation/Gait Assistance Details initial 350' pt was carrying 2# dumbbells in each hand; 2# weight used on RLE to decrease ataxia    Ambulation Distance (Feet) 350 Feet   115' without use of weights   Assistive device None    Gait Pattern Step-through pattern;Ataxic    Ambulation Surface Level;Indoor    Stairs Yes    Stairs Assistance 5: Supervision    Stair Management Technique One rail Left;Step to pattern;Alternating pattern;Forwards   step to pattern used initially, progressing to step over step sequence with Lt hand rail only   Number of Stairs 12   4 steps 3 reps   Height of Stairs 6      Neuro Re-ed    Neuro Re-ed Details  Obstacle course set up to improve balance with dynamic gait and with SLS; pt performed negotiation of floor ladder with part of ladder placed on blue mat for compliant surface training - CGA- pt used step over step sequence for 3 reps; on 4th rep pt performed stepping out/in rungs of ladder for improved coordination RLE;  pt performed cone taps to 4 cones and performed touching targets to 2 balance bubbles   Cones & balance bubbles placed on floor                Balance Exercises - 04/06/21 0001       Balance Exercises: Standing   Marching Foam/compliant surface;Head turns;Static   horizontal and vertical head turns 5 reps each   Other Standing Exercises Pt performed alternating tap ups to 1st & 2nd step 10 reps each foot , standing on floor without UE support    Other Standing Exercises Comments Pt performed stepping strategy exercise standing on blue mat - stepping forward/back, out/in, and back/forward 5 reps with each foot each direction with CGA to min assist for recovery of LOB - standing on blue mat on floor                  PT Short Term Goals - 04/06/21 1904       PT SHORT TERM GOAL #1   Title Improve Berg score from 34/56 to >/= 39/56 for reduced fall risk.    Baseline 03/18/21: 50/56 scored today    Status Achieved      PT SHORT TERM GOAL #2   Title Improve TUG score  from 17.32 secs to </= 14.5 secs without use of device to increase safety with ambulation.    Baseline 03/18/21: 14.41 secs with RW, 15.18 sec's no AD, met with RW not no AD    Status On-going    Target Date 04/17/21      PT SHORT TERM GOAL #3   Title Increase gait velocity from 2.25 ft/sec without device to >/= 2.6 ft/sec with CGA for incr. gait efficiency.    Baseline 27/27/22: 2.41 ft/sec with RW with supervision, 2.65 ft/sec no AD with min guard assist, met with no AD    Status Achieved      PT SHORT TERM GOAL #4   Title Assess DGI and establish goal as appropriate. Updated STG:  Increase DGI score from 13/24 to >/= 18/24 to reduce fall risk.    Baseline 03/20/21: 13/24 scored today as baseline, baseline added to LTG    Status New    Target Date 04/17/21      PT SHORT TERM GOAL #5   Title Pt wil amb. 350' with CGA on flat, even surface without LOB.; Updated STG;  Amb. 350' with supervision without use of RW, including amb. to all therapies without RW without LOB.    Baseline 03/18/21: met in session with RW    Status New    Target Date 04/17/21      PT SHORT TERM GOAL #6   Title Independent in HEP for balance and strengthening exercises.    Baseline 03/18/21: met with current HEP per pt report    Status Achieved    Target Date 03/20/21               PT Long Term Goals - 04/06/21 1905       PT LONG TERM GOAL #1   Title Improve Berg score from 34/56 to >/= 50/56 to reduce fall risk.. (all LTGs due 05/22/21)    Baseline 34/56 on 02-19-21    Time 12    Period Weeks    Status On-going      PT LONG TERM GOAL #2   Title Pt will ambulate 1000' modified independently without device on even and uneven surfaces.     Time 12    Period Weeks    Status On-going      PT LONG TERM GOAL #3   Title Improve TUG score from 17.32 secs to </= 13 secs to reduce fall risk.    Baseline 17.32 secs on 02-19-21 with CGA    Time 12    Period Weeks    Status On-going      PT LONG TERM GOAL #4   Title Improve DGI by at least 10 points to increase safety with gait.    Baseline 03/20/21: 13/24    Time 12    Period Weeks    Status On-going      PT LONG TERM GOAL #5   Title Independent in updated HEP as appropriate.    Time 12    Period Weeks    Status On-going      PT LONG TERM GOAL #6   Title Increase LE FOTO score by at least 10 points to demo improvement in functional status.    Baseline 59/100 on 02-19-21    Time 12    Period Weeks    Status On-going                   Plan - 04/06/21 1906     Clinical Impression  Statement This 10th visit progress note covers dates 02-19-21 - 04-06-21.  Pt is progressing well towards goals but continues to use RW for assistance with community ambulation.  Pt's Berg score has improved from 39/56 to 50/56; DGI score = 13/24 (initially assessed on 03-20-21).  Pt is able to amb. in home without use of RW, holding onto furniture or objects as needed for balance.  Pt's RLE is more impaired than LLE with increased ataxia noted.  Cont with POC.    Personal Factors and Comorbidities Comorbidity 2;Past/Current Experience;Transportation    Comorbidities migraines, epilepsy with seizure disorder, DM, CAD, s/p cataract sx on bil. eyes, memory deficit    Examination-Activity Limitations Bathing;Stand;Transfers;Carry;Squat;Stairs;Locomotion Level;Hygiene/Grooming;Bend    Examination-Participation Restrictions Cleaning;Community Activity;Driving;Laundry;Shop;Meal Prep    Stability/Clinical Decision Making Evolving/Moderate complexity    Rehab Potential Good    PT Frequency 2x / week    PT Duration 12 weeks    PT Treatment/Interventions ADLs/Self Care Home Management;Aquatic  Therapy;DME Instruction;Gait training;Stair training;Therapeutic activities;Therapeutic exercise;Balance training;Neuromuscular re-education;Patient/family education    PT Next Visit Plan Cont gait training without RW, balance and coordination exercises; RLE strengthening    PT Home Exercise Plan balance HEP issued on 03-12-21    Consulted and Agree with Plan of Care Patient             Patient will benefit from skilled therapeutic intervention in order to improve the following deficits and impairments:  Abnormal gait, Decreased activity tolerance, Decreased endurance, Decreased balance, Decreased coordination, Decreased strength, Impaired UE functional use  Visit Diagnosis: Muscle weakness (generalized)  Other lack of coordination  Unsteadiness on feet  Other abnormalities of gait and mobility     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[place]) 03/09/2021   Cerebellar stroke ([place]) 01/30/2021   Acute cerebrovascular accident (CVA) ([place]) 01/20/2021   Acute CVA (cerebrovascular accident) ([place]) 01/20/2021   GERD (gastroesophageal reflux disease)    Coronary artery disease    Depression 01/06/2020   Pneumonia 01/05/2020   History of chest pain 09/26/2018   Dyspnea on exertion 09/08/2018   Chest pain, rule out acute myocardial infarction 10/30/2017   Edema 01/25/2017   Tobacco abuse 06/10/2016   Headache 09/15/2015   OSA (obstructive sleep apnea) 07/30/2015   Nephrolithiasis 07/30/2015   Vasovagal syncope 07/30/2015   Carotid artery stenosis, asymptomatic 07/30/2015   CAD (coronary artery disease) 08/22/2013   Essential hypertension 08/22/2013   Diabetes mellitus without complication ([place])    Dyslipidemia    Seizures ([place])    Generalized convulsive epilepsy ([place]) 03/22/2013   Memory deficit 03/22/2013    Alda Lea, PT 04/06/2021, 7:14 PM  [facility] [address] [place], [place], [address] Phone: [phone]   Fax:  [phone]  Name: Samantha Huffman MRN: [id number] Date of Birth: 1955/11/23

## 2021-04-06 NOTE — Therapy (Signed)
[facility] [address] [place], [place], [address] Phone: [phone]   Fax:  [phone]  Occupational Therapy Treatment  Patient Details  Name: Samantha Huffman MRN: [id number] Date of Birth: [birth year] Referring Provider (OT): Danella Sensing, NP   Encounter Date: 04/06/2021   OT End of Session - 04/06/21 0808     Visit Number 10    Number of Visits 25    Date for OT Re-Evaluation 06/08/21    Authorization Type A[employer]na Medicare (recert completed 10/08/22 due to delay in start of OT due to schedule conflicts)    Authorization - Visit Number 10    Authorization - Number of Visits 10    Progress Note Due on Visit 10    OT Start Time 0803    OT Stop Time 0843    OT Time Calculation (min) 40 min    Equipment Utilized During Treatment **FOTO**    Activity Tolerance Patient tolerated treatment well    Behavior During Therapy [facility] for tasks assessed/performed             Past Medical History:  Diagnosis Date   Carotid artery stenosis    1-39% bilateral stenosis by dopplers 08/2020   Coronary artery disease    a. s/p CABG 10/2012.  cath 05/2016 with moderate LM stenosis, mild LCx and RCA stenosis and occluded LAD with patent LIMA to LAD, free radial to OM.  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   Dyslipidemia    Family history of adverse reaction to anesthesia    mother and sister ponv   Generalized convulsive epilepsy without mention of intractable epilepsy    GERD (gastroesophageal reflux disease)    History of kidney stones    Hypertension    Memory deficit    slight   Migraine    "controlled on daily RX " (06/11/2016)   Nephrolithiasis 07/30/2015   S/p lithotripsy x 3 and right and left ureter stents   OSA (obstructive sleep apnea)    cpap    PONV (postoperative nausea and vomiting)    Seizures ([place])    "controlled w/daily RX; started in 2012; dr thinks they might be from the migraines; last sz was early  part of 2016" (06/11/2016)   Type II diabetes mellitus ([place])    metformin   Vasovagal syncope 07/30/2015   2011    Past Surgical History:  Procedure Laterality Date   CARDIAC CATHETERIZATION  2014; 06/11/2016   CARDIAC CATHETERIZATION N/A 06/11/2016   Procedure: Left Heart Cath and Cors/Grafts Angiography;  Surgeon: Sherren Mocha, MD;  Location: [facility];  Service: Cardiovascular;  Laterality: N/A;   CESAREAN SECTION     CORONARY ANGIOPLASTY     CORONARY ARTERY BYPASS GRAFT  March, 2014   LIMA to LAD, left radial to LCx x 2   CYSTOSCOPY W/ URETERAL STENT PLACEMENT Left 01/16/2019   Procedure: CYSTOSCOPY WITH RETROGRADE PYELOGRAM/URETERAL LEFT STENT PLACEMENT;  Surgeon: Ardis Hughs, MD;  [facility];  Service: Urology;  Laterality: Left;   CYSTOSCOPY/URETEROSCOPY/HOLMIUM LASER/STENT PLACEMENT Left 01/24/2019   Procedure: CYSTOSCOPY, LEFT URETEROSCOPY, HOLMIUM LASER, STONE REMOVAL AND STENT EXCHANGE;  Surgeon: Ardis Hughs, MD;  [facility]: [facility];  Service: Urology;  Laterality: Left;   LITHOTRIPSY  X 3   RIGHT/LEFT HEART CATH AND CORONARY/GRAFT ANGIOGRAPHY N/A 09/08/2018   Procedure: RIGHT/LEFT HEART CATH AND CORONARY/GRAFT ANGIOGRAPHY;  Surgeon: Martinique, Peter M, MD;  Location: [facility];  Service: Cardiovascular;  Laterality:  N/A;   TOTAL ABDOMINAL HYSTERECTOMY     ovaries took before hysterectomy   TUBAL LIGATION     URETERAL STENT PLACEMENT      There were no vitals filed for this visit.   Subjective Assessment - 04/06/21 0805     Subjective  Pt reports doing more at home, but she/dtr prefers dtr to assist with some tasks.  Pt had a fall yesterday coming up steps ("going too fast" per pt) with R arm/hand scapes    Pertinent History CVA with R-sided weakness/ataxia.  PMH:  CAD, HTN, DM, OSA, seizure disorder, dyslipidemia, migraine, CABG (2014), recent bilateral cataract surgeries just prior to CVA    Limitations fall risk, ataxia, visual deficits     Patient Stated Goals return to driving, return to living alone, cook    Currently in Pain? No/denies              Seated, closed-chain shoulder flex (floor>overhead) with BUEs with min cueing for control and positioning.  Reviewed wt. Bearing HEP, pt returned demo with min cueing.  Seated, functional reaching mid-high level to place medium pegs with RUE to copy design with good accuracy with min difficulty.  Removing using in-hand manipulation with min difficulty.    Arm bike x91mn level 1 for conditioning and reciprocal movement with occasional min cueing for shoulder hike without rest, forward/backwards.    Stacking/unstacking 1-inch blocks with min difficulty for incr RUE control.        OT Short Term Goals - 04/06/21 07062      OT SHORT TERM GOAL #1   Title Pt will be independent with initial HEP.--Check initial STGs (#1-4)  04/09/21    Time 4    Period Weeks    Status Achieved      OT SHORT TERM GOAL #2   Title Pt will demo improved coordination and functional reach for ADLs as shown by improving score on box and blocks test by at least 6.    Baseline 42 blocks    Time 4    Period Weeks    Status On-going   03/30/21:  43 blocks     OT SHORT TERM GOAL #3   Title Pt will perform bathing with min A.    Time 4    Period Weeks    Status Achieved   03/30/21:  min A for washing, needs assist for transfer due to prefer bath     OT SHORT TERM GOAL #4   Title Pt will perofrm UB dressing mod I.    Time 4    Period Weeks    Status Achieved      OT SHORT TERM GOAL #5   Title Pt will perform LB dressing with min A.--check STGs #5-8  05/10/21    Time 8    Period Weeks    Status Achieved      OT SHORT TERM GOAL #6   Title Pt will perform bathing with supervision except transfer (pt prefers bath and will need A due to safety, has been provided with info for tub transfer bench).    Time 8    Period Weeks    Status Deferred   04/06/21:  min A due to pt preference      OT SHORT TERM GOAL #7   Title Pt will peform simple home maintenane task and snack prep mod I.    Time 8    Period Weeks    Status Achieved   04/06/21:  pt performing simple tasks (dusting, making coffee,  microwaving, wiping counters)     OT SHORT TERM GOAL #8   Title Pt will cut food mod I using AE prn.    Time 8    Period Weeks    Status On-going   04/06/21:  ongoing/not met              OT Long Term Goals - 03/25/21 1051       OT LONG TERM GOAL #1   Title Pt will be independent with updated HEP--check LTGs 06/08/21    Time 12    Period Weeks    Status New      OT LONG TERM GOAL #2   Title Pt will perform BADLs mod I with AE/DME and strategies prn.    Time 12    Period Weeks    Status New      OT LONG TERM GOAL #3   Title Pt wil be for memory/cognitive compensation strategies for ADL/IADLs prn.    Time 12    Period Weeks    Status New      OT LONG TERM GOAL #4   Title Pt will perform simple cooking task with supervision.    Time 12    Period Weeks    Status New      OT LONG TERM GOAL #5   Title Pt will perform environmental scanning/navigation with at least 90% accuracy (with head turns) without reports of dizziness/LOB.    Time 12    Period Weeks    Status New      OT LONG TERM GOAL #6   Title Pt will improve RUE coordination as shown by completing 9-hole peg test in less than 36sec without drops.    Time 12    Period Weeks    Status New                   Plan - 04/06/21 0809     Clinical Impression Statement Progress Note Reporting Period 02/19/21-04/06/21:  Pt is progressing towards goals with improving RUE control and coordination. Pt reports improving ADLs performance.  Pt would benefit from further occupational therapy to address continued RUE control and ADL/IADL performance.  5/8 STGs met (1 deferred due to pt preference).    OT Occupational Profile and History Detailed Assessment- Review of Records and additional review of physical,  cognitive, psychosocial history related to current functional performance    Occupational performance deficits (Please refer to evaluation for details): ADL's;IADL's;Leisure;Social Participation    Body Structure / Function / Physical Skills ADL;Decreased knowledge of use of DME;Strength;GMC;Dexterity;Balance;UE functional use;IADL;Endurance;ROM;Vision;Improper spinal/pelvic alignment;Coordination;Mobility;FMC    Cognitive Skills Attention;Memory;Understand    Rehab Potential Good    Clinical Decision Making Several treatment options, min-mod task modification necessary    Comorbidities Affecting Occupational Performance: May have comorbidities impacting occupational performance    Modification or Assistance to Complete Evaluation  Min-Moderate modification of tasks or assist with assess necessary to complete eval    OT Frequency 2x / week    OT Duration 12 weeks   +eval   OT Treatment/Interventions Self-care/ADL training;Moist Heat;DME and/or AE instruction;Balance training;Aquatic Therapy;Therapeutic activities;Cognitive remediation/compensation;Therapeutic exercise;Cryotherapy;Neuromuscular education;Functional Mobility Training;Passive range of motion;Visual/perceptual remediation/compensation;Patient/family education;Manual Therapy;Energy conservation;Paraffin;Fluidtherapy    Plan show pt rocker knife/further address STG #8, functional reaching, IADL task    Consulted and Agree with Plan of Care Patient             Patient will benefit from skilled therapeutic  intervention in order to improve the following deficits and impairments:   Body Structure / Function / Physical Skills: ADL, Decreased knowledge of use of DME, Strength, GMC, Dexterity, Balance, UE functional use, IADL, Endurance, ROM, Vision, Improper spinal/pelvic alignment, Coordination, Mobility, FMC Cognitive Skills: Attention, Memory, Understand     Visit Diagnosis: Ataxia  Other lack of coordination  Other symptoms  and signs involving cognitive functions following cerebral infarction  Unsteadiness on feet  Attention and concentration deficit  Visuospatial deficit  Muscle weakness (generalized)    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[place]) 03/09/2021   Cerebellar stroke ([place]) 01/30/2021   Acute cerebrovascular accident (CVA) ([place]) 01/20/2021   Acute CVA (cerebrovascular accident) ([place]) 01/20/2021   GERD (gastroesophageal reflux disease)    Coronary artery disease    Depression 01/06/2020   Pneumonia 01/05/2020   History of chest pain 09/26/2018   Dyspnea on exertion 09/08/2018   Chest pain, rule out acute myocardial infarction 10/30/2017   Edema 01/25/2017   Tobacco abuse 06/10/2016   Headache 09/15/2015   OSA (obstructive sleep apnea) 07/30/2015   Nephrolithiasis 07/30/2015   Vasovagal syncope 07/30/2015   Carotid artery stenosis, asymptomatic 07/30/2015   CAD (coronary artery disease) 08/22/2013   Essential hypertension 08/22/2013   Diabetes mellitus without complication ([place])    Dyslipidemia    Seizures ([place])    Generalized convulsive epilepsy ([place]) 03/22/2013   Memory deficit 03/22/2013    [facility] 04/06/2021, 8:37 AM  [facility] [address] [place], [place], [address] Phone: [phone]   Fax:  [phone]  Name: Deniah Saia MRN: [id number] Date of Birth: [birth year]  Vianne Bulls, OTR/L [facility] [address] [place], [place]  [address] [phone] phone [phone] 04/06/21 8:37 AM

## 2021-04-08 ENCOUNTER — Encounter: Payer: Self-pay | Admitting: Physical Therapy

## 2021-04-08 ENCOUNTER — Ambulatory Visit: Payer: Medicare HMO | Admitting: Physical Therapy

## 2021-04-08 ENCOUNTER — Ambulatory Visit: Payer: Medicare HMO

## 2021-04-08 ENCOUNTER — Other Ambulatory Visit: Payer: Self-pay

## 2021-04-08 ENCOUNTER — Ambulatory Visit: Payer: Medicare HMO | Admitting: Occupational Therapy

## 2021-04-08 VITALS — BP 162/85 | HR 66

## 2021-04-08 DIAGNOSIS — R2681 Unsteadiness on feet: Secondary | ICD-10-CM

## 2021-04-08 DIAGNOSIS — R41840 Attention and concentration deficit: Secondary | ICD-10-CM

## 2021-04-08 DIAGNOSIS — R471 Dysarthria and anarthria: Secondary | ICD-10-CM

## 2021-04-08 DIAGNOSIS — R41841 Cognitive communication deficit: Secondary | ICD-10-CM

## 2021-04-08 DIAGNOSIS — M6281 Muscle weakness (generalized): Secondary | ICD-10-CM

## 2021-04-08 DIAGNOSIS — R2689 Other abnormalities of gait and mobility: Secondary | ICD-10-CM | POA: Diagnosis not present

## 2021-04-08 DIAGNOSIS — I69318 Other symptoms and signs involving cognitive functions following cerebral infarction: Secondary | ICD-10-CM

## 2021-04-08 DIAGNOSIS — R41842 Visuospatial deficit: Secondary | ICD-10-CM

## 2021-04-08 DIAGNOSIS — R278 Other lack of coordination: Secondary | ICD-10-CM

## 2021-04-08 DIAGNOSIS — R482 Apraxia: Secondary | ICD-10-CM

## 2021-04-08 NOTE — Therapy (Signed)
[facility] [address] [place], [place], [address] Phone: [phone]   Fax:  [phone]  Occupational Therapy Treatment  Patient Details  Name: Samantha Huffman MRN: [id number] Date of Birth: 09/16/1955 Referring Provider (OT): Eunice Thomas, NP   Encounter Date: 04/08/2021   OT End of Session - 04/08/21 0908     Visit Number 11    Number of Visits 25    Date for OT Re-Evaluation 06/08/21    Authorization Type A[employer] Medicare (recert completed 03/10/21 due to delay in start of OT due to schedule conflicts)    Authorization - Visit Number 10    Authorization - Number of Visits 10    Progress Note Due on Visit 10    OT Start Time 0803    OT Stop Time 0844    OT Time Calculation (min) 41 min    Equipment Utilized During Treatment **FOTO**    Activity Tolerance Patient tolerated treatment well    Behavior During Therapy WFL for tasks assessed/performed             Past Medical History:  Diagnosis Date   Carotid artery stenosis    1-39% bilateral stenosis by dopplers 08/2020   Coronary artery disease    a. s/p CABG 10/2012.  cath 05/2016 with moderate LM stenosis, mild LCx and RCA stenosis and occluded LAD with patent LIMA to LAD, free radial to OM.  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   Dyslipidemia    Family history of adverse reaction to anesthesia    mother and sister ponv   Generalized convulsive epilepsy without mention of intractable epilepsy    GERD (gastroesophageal reflux disease)    History of kidney stones    Hypertension    Memory deficit    slight   Migraine    "controlled on daily RX " (06/11/2016)   Nephrolithiasis 07/30/2015   S/p lithotripsy x 3 and right and left ureter stents   OSA (obstructive sleep apnea)    cpap    PONV (postoperative nausea and vomiting)    Seizures (HCC)    "controlled w/daily RX; started in 2012; dr thinks they might be from the migraines; last sz was early  part of 2016" (06/11/2016)   Type II diabetes mellitus (HCC)    metformin   Vasovagal syncope 07/30/2015   2011    Past Surgical History:  Procedure Laterality Date   CARDIAC CATHETERIZATION  2014; 06/11/2016   CARDIAC CATHETERIZATION N/A 06/11/2016   Procedure: Left Heart Cath and Cors/Grafts Angiography;  Surgeon: Michael Cooper, MD;  [facility];  Service: Cardiovascular;  Laterality: N/A;   CESAREAN SECTION     CORONARY ANGIOPLASTY     CORONARY ARTERY BYPASS GRAFT  March, 2014   LIMA to LAD, left radial to LCx x 2   CYSTOSCOPY W/ URETERAL STENT PLACEMENT Left 01/16/2019   Procedure: CYSTOSCOPY WITH RETROGRADE PYELOGRAM/URETERAL LEFT STENT PLACEMENT;  Surgeon: Herrick, Benjamin W, MD;  Location: [facility];  Service: Urology;  Laterality: Left;   CYSTOSCOPY/URETEROSCOPY/HOLMIUM LASER/STENT PLACEMENT Left 01/24/2019   Procedure: CYSTOSCOPY, LEFT URETEROSCOPY, HOLMIUM LASER, STONE REMOVAL AND STENT EXCHANGE;  Surgeon: Herrick, Benjamin W, MD;  Location: [facility];  Service: Urology;  Laterality: Left;   LITHOTRIPSY  X 3   RIGHT/LEFT HEART CATH AND CORONARY/GRAFT ANGIOGRAPHY N/A 09/08/2018   Procedure: RIGHT/LEFT HEART CATH AND CORONARY/GRAFT ANGIOGRAPHY;  Surgeon: Jordan, Peter M, MD;  [facility]: [facility];  Service: Cardiovascular;  Laterality:   N/A;   TOTAL ABDOMINAL HYSTERECTOMY     ovaries took before hysterectomy   TUBAL LIGATION     URETERAL STENT PLACEMENT      There were no vitals filed for this visit.   Subjective Assessment - 04/08/21 0909     Pertinent History CVA with R-sided weakness/ataxia.  PMH:  CAD, HTN, DM, OSA, seizure disorder, dyslipidemia, migraine, CABG (2014), recent bilateral cataract surgeries just prior to CVA    Limitations fall risk, ataxia, visual deficits    Patient Stated Goals return to driving, return to living alone, cook    Currently in Pain? No/denies                    Environmental scanning task in min distracting  environment with RW, min v.c to slow down 80% accuracy. Copying small peg design with RUE, min difficulty/ v.c  to compensate for tremors, removing with in hand manipulation               OT Short Term Goals - 04/08/21 0809       OT SHORT TERM GOAL #1   Title Pt will be independent with initial HEP.--Check initial STGs (#1-4)  04/09/21    Time 4    Period Weeks    Status Achieved      OT SHORT TERM GOAL #2   Title Pt will demo improved coordination and functional reach for ADLs as shown by improving score on box and blocks test by at least 6.    Baseline 42 blocks    Time 4    Period Weeks    Status On-going   03/30/21:  43 blocks     OT SHORT TERM GOAL #3   Title Pt will perform bathing with min A.    Time 4    Period Weeks    Status Achieved   03/30/21:  min A for washing, needs assist for transfer due to prefer bath     OT SHORT TERM GOAL #4   Title Pt will perofrm UB dressing mod I.    Time 4    Period Weeks    Status Achieved      OT SHORT TERM GOAL #5   Title Pt will perform LB dressing with min A.--check STGs #5-8  05/10/21    Time 8    Period Weeks    Status Achieved      OT SHORT TERM GOAL #6   Title Pt will perform bathing with supervision except transfer (pt prefers bath and will need A due to safety, has been provided with info for tub transfer bench).    Time 8    Period Weeks    Status Deferred   04/06/21:  min A due to pt preference     OT SHORT TERM GOAL #7   Title Pt will peform simple home maintenane task and snack prep mod I.    Time 8    Period Weeks    Status Achieved   04/06/21:  pt performing simple tasks (dusting, making coffee,  microwaving, wiping counters)     OT SHORT TERM GOAL #8   Title Pt will cut food mod I using AE prn.    Time 8    Period Weeks    Status Achieved   met using rocker knife, info provided              OT Long Term Goals - 04/08/21 [phone]  OT LONG TERM GOAL #1   Title Pt will be independent with  updated HEP--check LTGs 06/08/21    Time 12    Period Weeks    Status On-going      OT LONG TERM GOAL #2   Title Pt will perform BADLs mod I with AE/DME and strategies prn.    Time 12    Period Weeks    Status On-going      OT LONG TERM GOAL #3   Title Pt wil be for memory/cognitive compensation strategies for ADL/IADLs prn.    Time 12    Period Weeks    Status On-going   04/08/21- issued memory compensations     OT LONG TERM GOAL #4   Title Pt will perform simple cooking task with supervision.    Time 12    Period Weeks    Status On-going      OT LONG TERM GOAL #5   Title Pt will perform environmental scanning/navigation with at least 90% accuracy (with head turns) without reports of dizziness/LOB.    Time 12    Period Weeks    Status On-going      OT LONG TERM GOAL #6   Title Pt will improve RUE coordination as shown by completing 9-hole peg test in less than 36sec without drops.    Time 12    Period Weeks    Status On-going                   Plan - 04/08/21 0900     Clinical Impression Statement Pt is progressing towards goals. She demonstrates understanding of rocker knife use and is able to cut food using    OT Occupational Profile and History Detailed Assessment- Review of Records and additional review of physical, cognitive, psychosocial history related to current functional performance    Occupational performance deficits (Please refer to evaluation for details): ADL's;IADL's;Leisure;Social Participation    Body Structure / Function / Physical Skills ADL;Decreased knowledge of use of DME;Strength;GMC;Dexterity;Balance;UE functional use;IADL;Endurance;ROM;Vision;Improper spinal/pelvic alignment;Coordination;Mobility;FMC    Cognitive Skills Attention;Memory;Understand    Rehab Potential Good    Clinical Decision Making Several treatment options, min-mod task modification necessary    Comorbidities Affecting Occupational Performance: May have comorbidities  impacting occupational performance    Modification or Assistance to Complete Evaluation  Min-Moderate modification of tasks or assist with assess necessary to complete eval    OT Frequency 2x / week    OT Duration 12 weeks   +eval   OT Treatment/Interventions Self-care/ADL training;Moist Heat;DME and/or AE instruction;Balance training;Aquatic Therapy;Therapeutic activities;Cognitive remediation/compensation;Therapeutic exercise;Cryotherapy;Neuromuscular education;Functional Mobility Training;Passive range of motion;Visual/perceptual remediation/compensation;Patient/family education;Manual Therapy;Energy conservation;Paraffin;Fluidtherapy    Plan simple cooking task to address long term goal, functional reaching, IADL task    Consulted and Agree with Plan of Care Patient             Patient will benefit from skilled therapeutic intervention in order to improve the following deficits and impairments:   Body Structure / Function / Physical Skills: ADL, Decreased knowledge of use of DME, Strength, GMC, Dexterity, Balance, UE functional use, IADL, Endurance, ROM, Vision, Improper spinal/pelvic alignment, Coordination, Mobility, FMC Cognitive Skills: Attention, Memory, Understand     Visit Diagnosis: Other lack of coordination  Other symptoms and signs involving cognitive functions following cerebral infarction  Attention and concentration deficit  Visuospatial deficit  Muscle weakness (generalized)    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) 03/09/2021   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   Acute cerebrovascular accident (CVA) (HCC) 01/20/2021   Acute CVA (cerebrovascular accident) (HCC) 01/20/2021   GERD (gastroesophageal reflux disease)    Coronary artery disease    Depression 01/06/2020   Pneumonia 01/05/2020   History of chest pain 09/26/2018   Dyspnea on exertion 09/08/2018   Chest pain, rule out acute myocardial  infarction 10/30/2017   Edema 01/25/2017   Tobacco abuse 06/10/2016   Headache 09/15/2015   OSA (obstructive sleep apnea) 07/30/2015   Nephrolithiasis 07/30/2015   Vasovagal syncope 07/30/2015   Carotid artery stenosis, asymptomatic 07/30/2015   CAD (coronary artery disease) 08/22/2013   Essential hypertension 08/22/2013   Diabetes mellitus without complication (HCC)    Dyslipidemia    Seizures (HCC)    Generalized convulsive epilepsy (HCC) 03/22/2013   Memory deficit 03/22/2013    , 04/08/2021, 9:09 AM  , OTR/L [phone] Phone: [phone] 9:12 AM 04/08/21  [facility] [address] [place], [place], [address] Phone: [phone]   Fax:  [phone]  Name: Samantha Huffman MRN: [id number] Date of Birth: 03/04/1956  

## 2021-04-08 NOTE — Therapy (Addendum)
[facility] [address] [place], [place], [address] Phone: [phone]   Fax:  [phone]  Physical Therapy Treatment  Patient Details  Name: Samantha Huffman MRN: [id number] Date of Birth: 1956-02-22 Referring Provider (PT): Lauraine Rinne, PA-C   Encounter Date: 04/08/2021   PT End of Session - 04/08/21 0849     Visit Number 11    Number of Visits 25    Date for PT Re-Evaluation 05/15/21    Authorization Type Aetna Medicare    Authorization Time Period 02-19-21 - 05-21-21    Progress Note Due on Visit 10    PT Start Time 0847    PT Stop Time 0928    PT Time Calculation (min) 41 min    Equipment Utilized During Treatment Gait belt    Activity Tolerance Patient tolerated treatment well    Behavior During Therapy [facility] for tasks assessed/performed             Past Medical History:  Diagnosis Date   Carotid artery stenosis    1-39% bilateral stenosis by dopplers 08/2020   Coronary artery disease    a. s/p CABG 10/2012.  cath 05/2016 with moderate LM stenosis, mild LCx and RCA stenosis and occluded LAD with patent LIMA to LAD, free radial to OM.  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   Dyslipidemia    Family history of adverse reaction to anesthesia    mother and sister ponv   Generalized convulsive epilepsy without mention of intractable epilepsy    GERD (gastroesophageal reflux disease)    History of kidney stones    Hypertension    Memory deficit    slight   Migraine    "controlled on daily RX " (06/11/2016)   Nephrolithiasis 07/30/2015   S/p lithotripsy x 3 and right and left ureter stents   OSA (obstructive sleep apnea)    cpap    PONV (postoperative nausea and vomiting)    Seizures ([place])    "controlled w/daily RX; started in 2012; dr thinks they might be from the migraines; last sz was early part of 2016" (06/11/2016)   Type II diabetes mellitus ([place])    metformin   Vasovagal syncope 07/30/2015    2011    Past Surgical History:  Procedure Laterality Date   CARDIAC CATHETERIZATION  2014; 06/11/2016   CARDIAC CATHETERIZATION N/A 06/11/2016   Procedure: Left Heart Cath and Cors/Grafts Angiography;  Surgeon: Sherren Mocha, MD;  Location: [place] [facility];  Service: Cardiovascular;  Laterality: N/A;   CESAREAN SECTION     CORONARY ANGIOPLASTY     CORONARY ARTERY BYPASS GRAFT  March, 2014   LIMA to LAD, left radial to LCx x 2   CYSTOSCOPY W/ URETERAL STENT PLACEMENT Left 01/16/2019   Procedure: CYSTOSCOPY WITH RETROGRADE PYELOGRAM/URETERAL LEFT STENT PLACEMENT;  Surgeon: Ardis Hughs, MD;  Location: [facility];  Service: Urology;  Laterality: Left;   CYSTOSCOPY/URETEROSCOPY/HOLMIUM LASER/STENT PLACEMENT Left 01/24/2019   Procedure: CYSTOSCOPY, LEFT URETEROSCOPY, HOLMIUM LASER, STONE REMOVAL AND STENT EXCHANGE;  Surgeon: Ardis Hughs, MD;  Location: [facility];  Service: Urology;  Laterality: Left;   LITHOTRIPSY  X 3   RIGHT/LEFT HEART CATH AND CORONARY/GRAFT ANGIOGRAPHY N/A 09/08/2018   Procedure: RIGHT/LEFT HEART CATH AND CORONARY/GRAFT ANGIOGRAPHY;  Surgeon: Martinique, Peter M, MD;  Location: [place] [facility];  Service: Cardiovascular;  Laterality: N/A;   TOTAL ABDOMINAL HYSTERECTOMY     ovaries took before hysterectomy   TUBAL LIGATION  URETERAL STENT PLACEMENT      Vitals:   04/08/21 0852 04/08/21 0907  BP: (!) 175/91 (!) 162/85  Pulse: 67 66     Subjective Assessment - 04/08/21 0848     Subjective No new falls.    Pertinent History Carotid artery stenosis, HTN, epilepsy with seizure disorder - last seizure was in early 2016:  migraines, DM, memory deficit, CAD,    Diagnostic tests CT of head and neck:  MRA    Patient Stated Goals Improve balance and walking                               [facility] Adult PT Treatment/Exercise - 04/08/21 0858       Ambulation/Gait   Ambulation/Gait Yes    Ambulation/Gait Assistance 4: Min guard     Ambulation/Gait Assistance Details with 2# ankle weight on RLE to decr ataxia, cues for neutral foot position with RLE. plus additional gait throughout session with no AD    Ambulation Distance (Feet) 230 Feet    Assistive device None    Gait Pattern Step-through pattern;Ataxic    Ambulation Surface Level;Indoor                 Balance Exercises - 04/08/21 0901       Balance Exercises: Standing   Standing Eyes Closed Foam/compliant surface;3 reps;30 secs    Standing Eyes Closed Limitations blue air ex, with feet hip width distance    SLS with Vectors Foam/compliant surface    SLS with Vectors Limitations stepping on 6 stepping stones next to countertop, down and back x5 reps with cues for slowed technique and foot placement, using countertop as needed with min guard/min A at times    Step Over Hurdles / Cones on blue mat: stepping over 3 larger orange obstacles in a reciprocal pattern, cues for slowed and controlled and incr foot clearance with RLE down and back x4 reps    Other Standing Exercises standing on LLE with 2# weight on RLE for strength/coordination - tapping forward/cross body tap on 6" step with RLE x10 reps    Other Standing Exercises Comments on blue air ex at bottom of staircase: alternating step up/up and down/down to 6" step UE support >none with min guard, x12 reps B               PT Education - 04/08/21 0929     Education Details discussed monitoring BP at home daily due to elevated readings today (pt reports that daughter checks it, but not daily). pt did report taking her BP meds before therapy today    Person(s) Educated Patient    Methods Explanation    Comprehension Verbalized understanding              PT Short Term Goals - 04/06/21 1904       PT SHORT TERM GOAL #1   Title Improve Berg score from 34/56 to >/= 39/56 for reduced fall risk.    Baseline 03/18/21: 50/56 scored today    Status Achieved      PT SHORT TERM GOAL #2   Title  Improve TUG score from 17.32 secs to </= 14.5 secs without use of device to increase safety with ambulation.    Baseline 03/18/21: 14.41 secs with RW, 15.18 sec's no AD, met with RW not no AD    Status On-going    Target Date 04/17/21  PT SHORT TERM GOAL #3   Title Increase gait velocity from 2.25 ft/sec without device to >/= 2.6 ft/sec with CGA for incr. gait efficiency.    Baseline 27/27/22: 2.41 ft/sec with RW with supervision, 2.65 ft/sec no AD with min guard assist, met with no AD    Status Achieved      PT SHORT TERM GOAL #4   Title Assess DGI and establish goal as appropriate. Updated STG:  Increase DGI score from 13/24 to >/= 18/24 to reduce fall risk.    Baseline 03/20/21: 13/24 scored today as baseline, baseline added to LTG    Status New    Target Date 04/17/21      PT SHORT TERM GOAL #5   Title Pt wil amb. 350' with CGA on flat, even surface without LOB.; Updated STG;  Amb. 350' with supervision without use of RW, including amb. to all therapies without RW without LOB.    Baseline 03/18/21: met in session with RW    Status New    Target Date 04/17/21      PT SHORT TERM GOAL #6   Title Independent in HEP for balance and strengthening exercises.    Baseline 03/18/21: met with current HEP per pt report    Status Achieved    Target Date 03/20/21               PT Long Term Goals - 04/06/21 1905       PT LONG TERM GOAL #1   Title Improve Berg score from 34/56 to >/= 50/56 to reduce fall risk.. (all LTGs due 05/22/21)    Baseline 34/56 on 02-19-21    Time 12    Period Weeks    Status On-going      PT LONG TERM GOAL #2   Title Pt will ambulate 1000' modified independently without device on even and uneven surfaces.    Time 12    Period Weeks    Status On-going      PT LONG TERM GOAL #3   Title Improve TUG score from 17.32 secs to </= 13 secs to reduce fall risk.    Baseline 17.32 secs on 02-19-21 with CGA    Time 12    Period Weeks    Status On-going      PT  LONG TERM GOAL #4   Title Improve DGI by at least 10 points to increase safety with gait.    Baseline 03/20/21: 13/24    Time 12    Period Weeks    Status On-going      PT LONG TERM GOAL #5   Title Independent in updated HEP as appropriate.    Time 12    Period Weeks    Status On-going      PT LONG TERM GOAL #6   Title Increase LE FOTO score by at least 10 points to demo improvement in functional status.    Baseline 59/100 on 02-19-21    Time 12    Period Weeks    Status On-going                   Plan - 04/08/21 1208     Clinical Impression Statement Pt's BP elevated at start of session but [facility] for therapy throughout session, pt taking her medications this morning. Today's session continued to focus on gait training with no AD, standing balance/coordination, and RLE strengthening. Pt tolerated session well, will continue to progress towards LTGs    Personal Factors  and Comorbidities Comorbidity 2;Past/Current Experience;Transportation    Comorbidities migraines, epilepsy with seizure disorder, DM, CAD, s/p cataract sx on bil. eyes, memory deficit    Examination-Activity Limitations Bathing;Stand;Transfers;Carry;Squat;Stairs;Locomotion Level;Hygiene/Grooming;Bend    Examination-Participation Restrictions Cleaning;Community Activity;Driving;Laundry;Shop;Meal Prep    Stability/Clinical Decision Making Evolving/Moderate complexity    Rehab Potential Good    PT Frequency 2x / week    PT Duration 12 weeks    PT Treatment/Interventions ADLs/Self Care Home Management;Aquatic Therapy;DME Instruction;Gait training;Stair training;Therapeutic activities;Therapeutic exercise;Balance training;Neuromuscular re-education;Patient/family education    PT Next Visit Plan Cont gait training without RW, balance and coordination exercises; RLE strengthening    PT Home Exercise Plan balance HEP issued on 03-12-21    Consulted and Agree with Plan of Care Patient             Patient will  benefit from skilled therapeutic intervention in order to improve the following deficits and impairments:  Abnormal gait, Decreased activity tolerance, Decreased endurance, Decreased balance, Decreased coordination, Decreased strength, Impaired UE functional use  Visit Diagnosis: Unsteadiness on feet  Muscle weakness (generalized)  Other lack of coordination     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[place]) 03/09/2021   Cerebellar stroke ([place]) 01/30/2021   Acute cerebrovascular accident (CVA) ([place]) 01/20/2021   Acute CVA (cerebrovascular accident) ([place]) 01/20/2021   GERD (gastroesophageal reflux disease)    Coronary artery disease    Depression 01/06/2020   Pneumonia 01/05/2020   History of chest pain 09/26/2018   Dyspnea on exertion 09/08/2018   Chest pain, rule out acute myocardial infarction 10/30/2017   Edema 01/25/2017   Tobacco abuse 06/10/2016   Headache 09/15/2015   OSA (obstructive sleep apnea) 07/30/2015   Nephrolithiasis 07/30/2015   Vasovagal syncope 07/30/2015   Carotid artery stenosis, asymptomatic 07/30/2015   CAD (coronary artery disease) 08/22/2013   Essential hypertension 08/22/2013   Diabetes mellitus without complication ([place])    Dyslipidemia    Seizures ([place])    Generalized convulsive epilepsy ([place]) 03/22/2013   Memory deficit 03/22/2013    Arliss Journey, PT, DPT  04/08/2021, 12:09 PM  [facility] [address] [place], [place], [address] Phone: [phone]   Fax:  [phone]  Name: Samantha Huffman MRN: [id number] Date of Birth: 12-Oct-1955

## 2021-04-08 NOTE — Patient Instructions (Signed)

## 2021-04-08 NOTE — Therapy (Signed)
[facility] [address] [place], [place], [address] Phone: [phone]   Fax:  [phone]  Speech Language Pathology Treatment  Patient Details  Name: Samantha Huffman MRN: [id number] Date of Birth: 08/16/1956 Referring Provider (SLP): Lauraine Rinne, [place]   Encounter Date: 04/08/2021   End of Session - 04/08/21 1649     Visit Number 11    Number of Visits 25    Date for SLP Re-Evaluation 05/20/21    SLP Start Time 0935    SLP Stop Time  2979    SLP Time Calculation (min) 40 min    Activity Tolerance Patient tolerated treatment well             Past Medical History:  Diagnosis Date   Carotid artery stenosis    1-39% bilateral stenosis by dopplers 08/2020   Coronary artery disease    a. s/p CABG 10/2012.  cath 05/2016 with moderate LM stenosis, mild LCx and RCA stenosis and occluded LAD with patent LIMA to LAD, free radial to OM.  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   Dyslipidemia    Family history of adverse reaction to anesthesia    mother and sister ponv   Generalized convulsive epilepsy without mention of intractable epilepsy    GERD (gastroesophageal reflux disease)    History of kidney stones    Hypertension    Memory deficit    slight   Migraine    "controlled on daily RX " (06/11/2016)   Nephrolithiasis 07/30/2015   S/p lithotripsy x 3 and right and left ureter stents   OSA (obstructive sleep apnea)    cpap    PONV (postoperative nausea and vomiting)    Seizures ([place])    "controlled w/daily RX; started in 2012; dr thinks they might be from the migraines; last sz was early part of 2016" (06/11/2016)   Type II diabetes mellitus ([place])    metformin   Vasovagal syncope 07/30/2015   2011    Past Surgical History:  Procedure Laterality Date   CARDIAC CATHETERIZATION  2014; 06/11/2016   CARDIAC CATHETERIZATION N/A 06/11/2016   Procedure: Left Heart Cath and Cors/Grafts Angiography;  Surgeon:  Sherren Mocha, MD;  Location: [facility];  Service: Cardiovascular;  Laterality: N/A;   CESAREAN SECTION     CORONARY ANGIOPLASTY     CORONARY ARTERY BYPASS GRAFT  March, 2014   LIMA to LAD, left radial to LCx x 2   CYSTOSCOPY W/ URETERAL STENT PLACEMENT Left 01/16/2019   Procedure: CYSTOSCOPY WITH RETROGRADE PYELOGRAM/URETERAL LEFT STENT PLACEMENT;  Surgeon: Ardis Hughs, MD;  Location: [facility];  Service: Urology;  Laterality: Left;   CYSTOSCOPY/URETEROSCOPY/HOLMIUM LASER/STENT PLACEMENT Left 01/24/2019   Procedure: CYSTOSCOPY, LEFT URETEROSCOPY, HOLMIUM LASER, STONE REMOVAL AND STENT EXCHANGE;  Surgeon: Ardis Hughs, MD;  Location: [facility];  Service: Urology;  Laterality: Left;   LITHOTRIPSY  X 3   RIGHT/LEFT HEART CATH AND CORONARY/GRAFT ANGIOGRAPHY N/A 09/08/2018   Procedure: RIGHT/LEFT HEART CATH AND CORONARY/GRAFT ANGIOGRAPHY;  Surgeon: Martinique, Peter M, MD;  Location: [facility];  Service: Cardiovascular;  Laterality: N/A;   TOTAL ABDOMINAL HYSTERECTOMY     ovaries took before hysterectomy   TUBAL LIGATION     URETERAL STENT PLACEMENT      There were no vitals filed for this visit.   Subjective Assessment - 04/08/21 1007     Subjective "This weekend we're going to [facility] to drop off my grandson."  Currently in Pain? No/denies                   ADULT SLP TREATMENT - 04/08/21 1008       General Information   Behavior/Cognition Alert;Pleasant mood;Cooperative;Distractible;Impulsive      Treatment Provided   Treatment provided Cognitive-Linquistic      Cognitive-Linquistic Treatment   Treatment focused on Dysarthria;Cognition    Skilled Treatment Pt had walker right side and perpendicular to her chair - her sit to stand included raching and putting rt hand on right walker handle, standing up while leaning to right (due to rt hand on rt walker handle), and dragging walker to her as she stood tall. SLP inquired pt why she did that and  she denied doing anything wrong. SLP reiterated correct way to sit-stand and pt stated, "My daughter gets after me she says mama you going too fast." SLP agreed with descriptiion pt gave of her daughter. SLP noticed today that when pt has high emotion her voice increases in pitch. SLP needed to cue pt x4 to decr pitch to WNL pitch, pt able to do so indpendently last two times. This session, SLP engaged pt in conversation asking for simple sequences in baking - pt told SLP correct sequences with limited number of steps. With SLP inquiry into details pt req'd extra time to think but generated steps given extra time.      Assessment / Recommendations / Plan   Plan Continue with current plan of care      Progression Toward Goals   Progression toward goals Progressing toward goals              SLP Education - 04/08/21 1654     Education Details slow down and think what pt is doing prior to doing it    Person(s) Educated Patient    Methods Explanation    Comprehension Verbalized understanding;Need further instruction              SLP Short Term Goals - 03/30/21 1140       SLP SHORT TERM GOAL #1   Title pt will engage in cognitive linguistic and speech strength/coordination testing and goals added as necessary    Status Achieved    Target Date 03/05/21      SLP SHORT TERM GOAL #2   Title using compensations, Charmagne will remind family when she needs to take meds (pt is now dependent upon family) 80% of opportunities between 3 sessions    Status Not Met    Target Date 03/20/21      SLP SHORT TERM GOAL #3   Title pt will name 10 items in a simple category with rare verbal cues x3 sessions    Baseline 03-23-21    Status Not Met    Target Date 03/20/21      SLP SHORT TERM GOAL #4   Title pt will independently use anomia compensations in 5 minutes simple conversation x3 sessions    Baseline 03-12-21, 03-23-21    Status Not Met    Target Date 03/20/21      SLP SHORT TERM GOAL #5    Title pt will exhibit 100% intelligibility in 5 minutes simple conversation using speech compensations, x3 sessions    Status Partially Met    Target Date 03/20/21      SLP SHORT TERM GOAL #6   Title Ros[place]ica will complete a speech HEP with rare min A x2 sessions    Status Not Met  Target Date 03/20/21      SLP SHORT TERM GOAL #7   Title Pt will verbalize and demonstrate 3 attention/memory strategies to aid functioning home with min A over 2 sessions    Status Not Met    Target Date 03/20/21              SLP Long Term Goals - 04/08/21 1654       SLP LONG TERM GOAL #1   Title pt will complete speech HEP with occasional min A x3 sessions    Time 6    Period Weeks    Status Revised    Target Date 04/17/21      SLP LONG TERM GOAL #2   Title in 7 minutes simple conversation pt will exhibit functional fluid speech using compensations in 2 sessions    Time 6    Period Weeks    Status Revised    Target Date 04/17/21      SLP LONG TERM GOAL #3   Title in 8 minutes mod complex conversation pt will exhibit functional fluid speech using compensations in 2 sessions    Time 10    Period Weeks    Status Revised    Target Date 05/15/21      SLP LONG TERM GOAL #4   Title pt will complete VNeST and SFA tasks with occasoinal min A in 3 sessions    Time 6    Period Weeks    Status Revised    Target Date 05/15/21      SLP LONG TERM GOAL #5   Title [place] will exhibit functional word-finding using compensations in 8 mintues mod complex conversation in 3 sessions    Baseline 03-30-21, 04-06-21    Time 10    Period Weeks    Status Revised      SLP LONG TERM GOAL #6   Title Felipe will independently take meds for 10 days as reported by Blanch Media or family    Time 10    Period Weeks    Status Revised    Target Date 05/15/21      SLP LONG TERM GOAL #7   Title Pt will verbalize and demonstrate 3 attention/memory strategies to aid functioning at work and home with rare min A over 2  sessions    Time 10    Period Weeks    Status Revised    Target Date 05/15/21               Patient will benefit from skilled therapeutic intervention in order to improve the following deficits and impairments:   Dysarthria and anarthria  Cognitive communication deficit  Verbal apraxia    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[place]) 03/09/2021   Cerebellar stroke ([place]) 01/30/2021   Acute cerebrovascular accident (CVA) ([place]) 01/20/2021   Acute CVA (cerebrovascular accident) ([place]) 01/20/2021   GERD (gastroesophageal reflux disease)    Coronary artery disease    Depression 01/06/2020   Pneumonia 01/05/2020   History of chest pain 09/26/2018   Dyspnea on exertion 09/08/2018   Chest pain, rule out acute myocardial infarction 10/30/2017   Edema 01/25/2017   Tobacco abuse 06/10/2016   Headache 09/15/2015   OSA (obstructive sleep apnea) 07/30/2015   Nephrolithiasis 07/30/2015   Vasovagal syncope 07/30/2015   Carotid artery stenosis, asymptomatic 07/30/2015   CAD (coronary artery disease) 08/22/2013   Essential hypertension 08/22/2013   Diabetes mellitus without complication (  [place])    Dyslipidemia    Seizures ([place])    Generalized convulsive epilepsy ([place]) 03/22/2013   Memory deficit 03/22/2013    [place]. ,[place], [place]  04/08/2021, 4:56 PM  [facility] [address] [place], [place], [address] Phone: [phone]   Fax:  [phone]   Name: Margret Moat MRN: [id number] Date of Birth: 04/02/1956

## 2021-04-13 ENCOUNTER — Other Ambulatory Visit: Payer: Self-pay

## 2021-04-13 ENCOUNTER — Encounter: Payer: Self-pay | Admitting: Speech Pathology

## 2021-04-13 ENCOUNTER — Encounter: Payer: Self-pay | Admitting: Occupational Therapy

## 2021-04-13 ENCOUNTER — Ambulatory Visit: Payer: Medicare HMO | Admitting: Speech Pathology

## 2021-04-13 ENCOUNTER — Ambulatory Visit: Payer: Medicare HMO | Admitting: Occupational Therapy

## 2021-04-13 ENCOUNTER — Ambulatory Visit: Payer: Medicare HMO | Admitting: Physical Therapy

## 2021-04-13 DIAGNOSIS — R270 Ataxia, unspecified: Secondary | ICD-10-CM

## 2021-04-13 DIAGNOSIS — R278 Other lack of coordination: Secondary | ICD-10-CM

## 2021-04-13 DIAGNOSIS — M6281 Muscle weakness (generalized): Secondary | ICD-10-CM

## 2021-04-13 DIAGNOSIS — R2689 Other abnormalities of gait and mobility: Secondary | ICD-10-CM

## 2021-04-13 DIAGNOSIS — R471 Dysarthria and anarthria: Secondary | ICD-10-CM

## 2021-04-13 DIAGNOSIS — R41842 Visuospatial deficit: Secondary | ICD-10-CM

## 2021-04-13 DIAGNOSIS — R41841 Cognitive communication deficit: Secondary | ICD-10-CM

## 2021-04-13 DIAGNOSIS — R41840 Attention and concentration deficit: Secondary | ICD-10-CM

## 2021-04-13 DIAGNOSIS — R2681 Unsteadiness on feet: Secondary | ICD-10-CM

## 2021-04-13 DIAGNOSIS — R4701 Aphasia: Secondary | ICD-10-CM

## 2021-04-13 DIAGNOSIS — I69318 Other symptoms and signs involving cognitive functions following cerebral infarction: Secondary | ICD-10-CM

## 2021-04-13 NOTE — Therapy (Signed)
[facility] [address] [place], [place], [address] Phone: [phone]   Fax:  [phone]  Physical Therapy Treatment  Patient Details  Name: Samantha Huffman MRN: [id number] Date of Birth: May 29, 1956 Referring Provider (PT): Samantha Rinne, PA-C   Encounter Date: 04/13/2021   PT End of Session - 04/13/21 2114     Visit Number 12    Number of Visits 25    Date for PT Re-Evaluation 05/15/21    Authorization Type [employer]    Authorization Time Period 02-19-21 - 05-21-21    Progress Note Due on Visit 10    PT Start Time 0850    PT Stop Time 0930    PT Time Calculation (min) 40 min    Equipment Utilized During Treatment Gait belt    Activity Tolerance Patient tolerated treatment well    Behavior During Therapy [facility] for tasks assessed/performed             Past Medical History:  Diagnosis Date   Carotid artery stenosis    1-39% bilateral stenosis by dopplers 08/2020   Coronary artery disease    a. s/p CABG 10/2012.  cath 05/2016 with moderate LM stenosis, mild LCx and RCA stenosis and occluded LAD with patent LIMA to LAD, free radial to OM.  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   Dyslipidemia    Family history of adverse reaction to anesthesia    mother and sister ponv   Generalized convulsive epilepsy without mention of intractable epilepsy    GERD (gastroesophageal reflux disease)    History of kidney stones    Hypertension    Memory deficit    slight   Migraine    "controlled on daily RX " (06/11/2016)   Nephrolithiasis 07/30/2015   S/p lithotripsy x 3 and right and left ureter stents   OSA (obstructive sleep apnea)    cpap    PONV (postoperative nausea and vomiting)    Seizures ([place])    "controlled w/daily RX; started in 2012; dr thinks they might be from the migraines; last sz was early part of 2016" (06/11/2016)   Type II diabetes mellitus ([place])    metformin   Vasovagal syncope 07/30/2015    2011    Past Surgical History:  Procedure Laterality Date   CARDIAC CATHETERIZATION  2014; 06/11/2016   CARDIAC CATHETERIZATION N/A 06/11/2016   Procedure: Left Heart Cath and Cors/Grafts Angiography;  Surgeon: Sherren Mocha, MD;  Location: [facility];  Service: Cardiovascular;  Laterality: N/A;   CESAREAN SECTION     CORONARY ANGIOPLASTY     CORONARY ARTERY BYPASS GRAFT  March, 2014   LIMA to LAD, left radial to LCx x 2   CYSTOSCOPY W/ URETERAL STENT PLACEMENT Left 01/16/2019   Procedure: CYSTOSCOPY WITH RETROGRADE PYELOGRAM/URETERAL LEFT STENT PLACEMENT;  Surgeon: Ardis Hughs, MD;  [facility];  Service: Urology;  Laterality: Left;   CYSTOSCOPY/URETEROSCOPY/HOLMIUM LASER/STENT PLACEMENT Left 01/24/2019   Procedure: CYSTOSCOPY, LEFT URETEROSCOPY, HOLMIUM LASER, STONE REMOVAL AND STENT EXCHANGE;  Surgeon: Ardis Hughs, MD;  Location: [facility];  Service: Urology;  Laterality: Left;   LITHOTRIPSY  X 3   RIGHT/LEFT HEART CATH AND CORONARY/GRAFT ANGIOGRAPHY N/A 09/08/2018   Procedure: RIGHT/LEFT HEART CATH AND CORONARY/GRAFT ANGIOGRAPHY;  Surgeon: Martinique, Peter M, MD;  Location: [facility];  Service: Cardiovascular;  Laterality: N/A;   TOTAL ABDOMINAL HYSTERECTOMY     ovaries took before hysterectomy   TUBAL LIGATION  URETERAL STENT PLACEMENT      There were no vitals filed for this visit.   Subjective Assessment - 04/13/21 0859     Subjective Pt reports she is doing rather well - went with daughter and grandson yesterday to move him into dorm at college; pt states she plans to finish up with all therapies on Sept. 9 - does not want to add on any more appts at this time    Pertinent History Carotid artery stenosis, HTN, epilepsy with seizure disorder - last seizure was in early 2016:  migraines, DM, memory deficit, CAD,    Diagnostic tests CT of head and neck:  MRA    Patient Stated Goals Improve balance and walking    Currently in Pain? No/denies                                [facility] Adult PT Treatment/Exercise - 04/13/21 0907       Ambulation/Gait   Ambulation/Gait Yes    Ambulation/Gait Assistance 4: Min guard;5: Supervision    Ambulation/Gait Assistance Details Pt carried 2# dumbbells and 2# weight was used on RLE to decrease ataxia    Ambulation Distance (Feet) 350 Feet   3 consecutive laps   Assistive device None    Gait Pattern Step-through pattern;Ataxic    Stairs Yes    Stairs Assistance 5: Supervision    Stair Management Technique One rail Left;Step to pattern;Alternating pattern;Forwards   step to pattern used initially, progressing to step over step sequence with Lt hand rail only   Number of Stairs 8   4 steps 2 reps     High Level Balance   High Level Balance Activities Turns;Sudden stops;Negotitating around obstacles                 Balance Exercises - 04/13/21 0001       Balance Exercises: Standing   Standing Eyes Closed Solid surface;1 rep;10 secs    SLS Eyes open;Solid surface;Upper extremity support 1;2 reps   10 secs on RLE with UE support prn   Rockerboard Anterior/posterior;Head turns;Other reps (comment)   20 reps   Marching Foam/compliant surface;Static   10 reps   Other Standing Exercises tap ups to 1st step 10 reps alternating; tap ups to 1st step, 2nd step, back to 1st step with RLE 5 reps with CGA for improved coordination with RLE    Other Standing Exercises Comments pt performed amb. with horizontal head turns 50' ; amb. with abrupt stop and turn 180 degrees with CGA without device; pt performed coordination exercise with RLE - rolling ball forward/back with Rt foot with LUE support prn 10 reps; laterally for 10 reps; pt then performed same exercise with LLE for improved RLE SLS                 PT Short Term Goals - 04/13/21 2119       PT SHORT TERM GOAL #1   Title Improve Berg score from 34/56 to >/= 39/56 for reduced fall risk.    Baseline 03/18/21: 50/56 scored  today    Status Achieved      PT SHORT TERM GOAL #2   Title Improve TUG score from 17.32 secs to </= 14.5 secs without use of device to increase safety with ambulation.    Baseline 03/18/21: 14.41 secs with RW, 15.18 sec's no AD, met with RW not no AD    Status On-going  Target Date 04/17/21      PT SHORT TERM GOAL #3   Title Increase gait velocity from 2.25 ft/sec without device to >/= 2.6 ft/sec with CGA for incr. gait efficiency.    Baseline 27/27/22: 2.41 ft/sec with RW with supervision, 2.65 ft/sec no AD with min guard assist, met with no AD    Status Achieved      PT SHORT TERM GOAL #4   Title Assess DGI and establish goal as appropriate. Updated STG:  Increase DGI score from 13/24 to >/= 18/24 to reduce fall risk.    Baseline 03/20/21: 13/24 scored today as baseline, baseline added to LTG    Status New    Target Date 04/17/21      PT SHORT TERM GOAL #5   Title Pt wil amb. 350' with CGA on flat, even surface without LOB.; Updated STG;  Amb. 350' with supervision without use of RW, including amb. to all therapies without RW without LOB.    Baseline 03/18/21: met in session with RW    Status New    Target Date 04/17/21      PT SHORT TERM GOAL #6   Title Independent in HEP for balance and strengthening exercises.    Baseline 03/18/21: met with current HEP per pt report    Status Achieved    Target Date 03/20/21               PT Long Term Goals - 04/13/21 0857       PT LONG TERM GOAL #1   Title Improve Berg score from 34/56 to >/= 50/56 to reduce fall risk.. (all LTGs due 05/22/21)    Baseline 34/56 on 02-19-21    Time 12    Period Weeks    Status On-going      PT LONG TERM GOAL #2   Title Pt will ambulate 1000' modified independently without device on even and uneven surfaces.    Time 12    Period Weeks    Status On-going      PT LONG TERM GOAL #3   Title Improve TUG score from 17.32 secs to </= 13 secs to reduce fall risk.    Baseline 17.32 secs on 02-19-21  with CGA    Time 12    Period Weeks    Status On-going      PT LONG TERM GOAL #4   Title Improve DGI by at least 10 points to increase safety with gait.    Baseline 03/20/21: 13/24    Time 12    Period Weeks    Status On-going      PT LONG TERM GOAL #5   Title Independent in updated HEP as appropriate.    Time 12    Period Weeks    Status On-going      PT LONG TERM GOAL #6   Title Increase LE FOTO score by at least 10 points to demo improvement in functional status.    Baseline 59/100 on 02-19-21    Time 12    Period Weeks    Status On-going                   Plan - 04/13/21 2114     Clinical Impression Statement Pt's gait is improved with use of 2# weight on RLE with less ataxia noted; pt continues to need frequent cues to keep Rt foot in neutral with less external rotation. Pt also has decreased SLS on RLE compared to LLE, however,  balance is improving overall.  Cont with POC.    Personal Factors and Comorbidities Comorbidity 2;Past/Current Experience;Transportation    Comorbidities migraines, epilepsy with seizure disorder, DM, CAD, s/p cataract sx on bil. eyes, memory deficit    Examination-Activity Limitations Bathing;Stand;Transfers;Carry;Squat;Stairs;Locomotion Level;Hygiene/Grooming;Bend    Examination-Participation Restrictions Cleaning;Community Activity;Driving;Laundry;Shop;Meal Prep    Stability/Clinical Decision Making Evolving/Moderate complexity    Rehab Potential Good    PT Frequency 2x / week    PT Duration 12 weeks    PT Treatment/Interventions ADLs/Self Care Home Management;Aquatic Therapy;DME Instruction;Gait training;Stair training;Therapeutic activities;Therapeutic exercise;Balance training;Neuromuscular re-education;Patient/family education    PT Next Visit Plan Cont gait training without RW, balance and coordination exercises; RLE strengthening    PT Home Exercise Plan balance HEP issued on 03-12-21    Consulted and Agree with Plan of Care  Patient             Patient will benefit from skilled therapeutic intervention in order to improve the following deficits and impairments:  Abnormal gait, Decreased activity tolerance, Decreased endurance, Decreased balance, Decreased coordination, Decreased strength, Impaired UE functional use  Visit Diagnosis: Other lack of coordination  Other abnormalities of gait and mobility  Unsteadiness on feet     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[place]) 03/09/2021   Cerebellar stroke ([place]) 01/30/2021   Acute cerebrovascular accident (CVA) ([place]) 01/20/2021   Acute CVA (cerebrovascular accident) ([place]) 01/20/2021   GERD (gastroesophageal reflux disease)    Coronary artery disease    Depression 01/06/2020   Pneumonia 01/05/2020   History of chest pain 09/26/2018   Dyspnea on exertion 09/08/2018   Chest pain, rule out acute myocardial infarction 10/30/2017   Edema 01/25/2017   Tobacco abuse 06/10/2016   Headache 09/15/2015   OSA (obstructive sleep apnea) 07/30/2015   Nephrolithiasis 07/30/2015   Vasovagal syncope 07/30/2015   Carotid artery stenosis, asymptomatic 07/30/2015   CAD (coronary artery disease) 08/22/2013   Essential hypertension 08/22/2013   Diabetes mellitus without complication ([place])    Dyslipidemia    Seizures ([place])    Generalized convulsive epilepsy ([place]) 03/22/2013   Memory deficit 03/22/2013    Alda Lea, PT 04/13/2021, 9:21 PM  [facility] [address] [place], [place], [address] Phone: [phone]   Fax:  [phone]  Name: Samantha Huffman MRN: [id number] Date of Birth: 1956/04/06

## 2021-04-13 NOTE — Therapy (Signed)
[facility] [address] [place], [place], [address] Phone: [phone]   Fax:  [phone]  Speech Language Pathology Treatment  Patient Details  Name: Samantha Huffman MRN: [id number] Date of Birth: Jul 18, 1956 Referring Provider (SLP): Lauraine Rinne, [place]   Encounter Date: 04/13/2021   End of Session - 04/13/21 1014     Visit Number 12    Number of Visits 25    Date for SLP Re-Evaluation 05/20/21    SLP Start Time 0932    SLP Stop Time  2297    SLP Time Calculation (min) 43 min             Past Medical History:  Diagnosis Date   Carotid artery stenosis    1-39% bilateral stenosis by dopplers 08/2020   Coronary artery disease    a. s/p CABG 10/2012.  cath 05/2016 with moderate LM stenosis, mild LCx and RCA stenosis and occluded LAD with patent LIMA to LAD, free radial to OM.  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   Dyslipidemia    Family history of adverse reaction to anesthesia    mother and sister ponv   Generalized convulsive epilepsy without mention of intractable epilepsy    GERD (gastroesophageal reflux disease)    History of kidney stones    Hypertension    Memory deficit    slight   Migraine    "controlled on daily RX " (06/11/2016)   Nephrolithiasis 07/30/2015   S/p lithotripsy x 3 and right and left ureter stents   OSA (obstructive sleep apnea)    cpap    PONV (postoperative nausea and vomiting)    Seizures ([place])    "controlled w/daily RX; started in 2012; dr thinks they might be from the migraines; last sz was early part of 2016" (06/11/2016)   Type II diabetes mellitus ([place])    metformin   Vasovagal syncope 07/30/2015   2011    Past Surgical History:  Procedure Laterality Date   CARDIAC CATHETERIZATION  2014; 06/11/2016   CARDIAC CATHETERIZATION N/A 06/11/2016   Procedure: Left Heart Cath and Cors/Grafts Angiography;  Surgeon: Sherren Mocha, MD;  Location: [facility];   Service: Cardiovascular;  Laterality: N/A;   CESAREAN SECTION     CORONARY ANGIOPLASTY     CORONARY ARTERY BYPASS GRAFT  March, 2014   LIMA to LAD, left radial to LCx x 2   CYSTOSCOPY W/ URETERAL STENT PLACEMENT Left 01/16/2019   Procedure: CYSTOSCOPY WITH RETROGRADE PYELOGRAM/URETERAL LEFT STENT PLACEMENT;  Surgeon: Ardis Hughs, MD;  Location: [facility];  Service: Urology;  Laterality: Left;   CYSTOSCOPY/URETEROSCOPY/HOLMIUM LASER/STENT PLACEMENT Left 01/24/2019   Procedure: CYSTOSCOPY, LEFT URETEROSCOPY, HOLMIUM LASER, STONE REMOVAL AND STENT EXCHANGE;  Surgeon: Ardis Hughs, MD;  Location: [facility];  Service: Urology;  Laterality: Left;   LITHOTRIPSY  X 3   RIGHT/LEFT HEART CATH AND CORONARY/GRAFT ANGIOGRAPHY N/A 09/08/2018   Procedure: RIGHT/LEFT HEART CATH AND CORONARY/GRAFT ANGIOGRAPHY;  Surgeon: Martinique, Peter M, MD;  Location: [facility];  Service: Cardiovascular;  Laterality: N/A;   TOTAL ABDOMINAL HYSTERECTOMY     ovaries took before hysterectomy   TUBAL LIGATION     URETERAL STENT PLACEMENT      There were no vitals filed for this visit.   Subjective Assessment - 04/13/21 0932     Subjective "It comes and goes" re: voice    Currently in Pain? No/denies  ADULT SLP TREATMENT - 04/13/21 0939       General Information   Behavior/Cognition Alert;Pleasant mood;Cooperative;Distractible;Impulsive      Treatment Provided   Treatment provided Cognitive-Linquistic      Cognitive-Linquistic Treatment   Treatment focused on Dysarthria;Cognition    Skilled Treatment Pt requires cues to slow down entering ST room. reviewed HW, with errors ID'd with frequent mod A. VNeST completed generating 3 subject and objects for verbs (measure, push, shake, catch) with extended time and usual mod verbal and visual cues. She consisitently ID'd 1-2 subjects and objects but required consistent cues to verbalize a 3rd. In VNeST, Vennela spontaneously used 2  gestures and synomym to compensate for 3 word finding episodes. In generative naming task, she named    herbs/spices (personally relevant category) with occasional semantic and 1st letter cues. She named 7 items spontaneously with extended time and to name 5/12. Voice with WNL pitch for 80% of session, pitch elevated with cues to ID and lower 2x.      Assessment / Recommendations / Plan   Plan Continue with current plan of care      Progression Toward Goals   Progression toward goals Progressing toward goals                SLP Short Term Goals - 04/13/21 1013       SLP SHORT TERM GOAL #1   Title pt will engage in cognitive linguistic and speech strength/coordination testing and goals added as necessary    Status Achieved    Target Date 03/05/21      SLP SHORT TERM GOAL #2   Title using compensations, Samantha Huffman will remind family when she needs to take meds (pt is now dependent upon family) 80% of opportunities between 3 sessions    Status Not Met    Target Date 03/20/21      SLP SHORT TERM GOAL #3   Title pt will name 10 items in a simple category with rare verbal cues x3 sessions    Baseline 03-23-21    Status Not Met    Target Date 03/20/21      SLP SHORT TERM GOAL #4   Title pt will independently use anomia compensations in 5 minutes simple conversation x3 sessions    Baseline 03-12-21, 03-23-21    Status Not Met    Target Date 03/20/21      SLP SHORT TERM GOAL #5   Title pt will exhibit 100% intelligibility in 5 minutes simple conversation using speech compensations, x3 sessions    Status Partially Met    Target Date 03/20/21      SLP SHORT TERM GOAL #6   Title Samantha Huffman will complete a speech HEP with rare min A x2 sessions    Status Not Met    Target Date 03/20/21      SLP SHORT TERM GOAL #7   Title Pt will verbalize and demonstrate 3 attention/memory strategies to aid functioning home with min A over 2 sessions    Status Not Met    Target Date 03/20/21               SLP Long Term Goals - 04/13/21 1013       SLP LONG TERM GOAL #1   Title pt will complete speech HEP with occasional min A x3 sessions    Time 5    Period Weeks    Status Revised      SLP LONG TERM GOAL #2   Title  in 7 minutes simple conversation pt will exhibit functional fluid speech using compensations in 2 sessions    Time 5    Period Weeks    Status Revised      SLP LONG TERM GOAL #3   Title in 8 minutes mod complex conversation pt will exhibit functional fluid speech using compensations in 2 sessions    Time 9    Period Weeks    Status Revised      SLP LONG TERM GOAL #4   Title pt will complete VNeST and SFA tasks with occasoinal min A in 3 sessions    Time 5    Period Weeks    Status Revised      SLP LONG TERM GOAL #5   Title Samantha Huffman will exhibit functional word-finding using compensations in 8 mintues mod complex conversation in 3 sessions    Baseline 03-30-21, 04-06-21    Time 9    Period Weeks    Status Revised      SLP LONG TERM GOAL #6   Title Samantha Huffman will independently take meds for 10 days as reported by Blanch Media or family    Time 9    Period Weeks    Status Revised      SLP LONG TERM GOAL #7   Title Pt will verbalize and demonstrate 3 attention/memory strategies to aid functioning at work and home with rare min A over 2 sessions    Time 9    Period Weeks    Status Revised              Plan - 04/13/21 1013     Clinical Impression Statement 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 At this time, she is still dependent on family and friends, however they also require training for activities that Kang can safely do and to help her progress to more independence. Today SLP assisted pt with Oncologist Treatment for family to assist pt at home, as pt's VNeST wer not compeltely correct (expanded sentences). Continue skilled ST to maximize cognition, languae and intelligibility for safety, independence and to reduce caregiver  burden.    Speech Therapy Frequency 2x / week    Duration 12 weeks    Treatment/Interventions Language facilitation;Cueing hierarchy;SLP instruction and feedback;Cognitive reorganization;Compensatory strategies;Internal/external aids;Patient/family education;Functional tasks;Environmental controls;Oral motor exercises    Potential to Achieve Goals Good             Patient will benefit from skilled therapeutic intervention in order to improve the following deficits and impairments:   Dysarthria and anarthria  Aphasia  Cognitive communication deficit    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[place]) 03/09/2021   Cerebellar stroke ([place]) 01/30/2021   Acute cerebrovascular accident (CVA) ([place]) 01/20/2021   Acute CVA (cerebrovascular accident) ([place]) 01/20/2021   GERD (gastroesophageal reflux disease)    Coronary artery disease    Depression 01/06/2020   Pneumonia 01/05/2020   History of chest pain 09/26/2018   Dyspnea on exertion 09/08/2018   Chest pain, rule out acute myocardial infarction 10/30/2017   Edema 01/25/2017   Tobacco abuse 06/10/2016   Headache 09/15/2015   OSA (obstructive sleep apnea) 07/30/2015   Nephrolithiasis 07/30/2015   Vasovagal syncope 07/30/2015   Carotid artery stenosis, asymptomatic 07/30/2015   CAD (coronary artery disease) 08/22/2013   Essential hypertension 08/22/2013   Diabetes mellitus without complication ([place])    Dyslipidemia    Seizures ([place])  Generalized convulsive epilepsy ([place]) 03/22/2013   Memory deficit 03/22/2013    Leighton Luster, Annye Rusk MS, CCC-SLP 04/13/2021, 10:14 AM  [facility] [address] [place], [place], [address] Phone: [phone]   Fax:  [phone]   Name: Kennetta Pavlovic MRN: [id number] Date of Birth: [birth year]

## 2021-04-13 NOTE — Therapy (Signed)
[facility] [address] [place], [place], [address] Phone: [phone]   Fax:  [phone]  Occupational Therapy Treatment  Patient Details  Name: Samantha Huffman MRN: [id number] Date of Birth: 03-22-1956 Referring Provider (OT): Danella Sensing, NP   Encounter Date: 04/13/2021   OT End of Session - 04/13/21 0808     Visit Number 12    Number of Visits 25    Date for OT Re-Evaluation 06/08/21    Authorization Type [employer] (recert completed 02/13/75 due to delay in start of OT due to schedule conflicts)    Authorization - Visit Number 11    Authorization - Number of Visits 20    Progress Note Due on Visit 10    OT Start Time 0807    OT Stop Time 0845    OT Time Calculation (min) 38 min    Equipment Utilized During Treatment **FOTO**    Activity Tolerance Patient tolerated treatment well    Behavior During Therapy [facility] for tasks assessed/performed             Past Medical History:  Diagnosis Date   Carotid artery stenosis    1-39% bilateral stenosis by dopplers 08/2020   Coronary artery disease    a. s/p CABG 10/2012.  cath 05/2016 with moderate LM stenosis, mild LCx and RCA stenosis and occluded LAD with patent LIMA to LAD, free radial to OM.  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   Dyslipidemia    Family history of adverse reaction to anesthesia    mother and sister ponv   Generalized convulsive epilepsy without mention of intractable epilepsy    GERD (gastroesophageal reflux disease)    History of kidney stones    Hypertension    Memory deficit    slight   Migraine    "controlled on daily RX " (06/11/2016)   Nephrolithiasis 07/30/2015   S/p lithotripsy x 3 and right and left ureter stents   OSA (obstructive sleep apnea)    cpap    PONV (postoperative nausea and vomiting)    Seizures ([place])    "controlled w/daily RX; started in 2012; dr thinks they might be from the migraines; last sz was early  part of 2016" (06/11/2016)   Type II diabetes mellitus ([place])    metformin   Vasovagal syncope 07/30/2015   2011    Past Surgical History:  Procedure Laterality Date   CARDIAC CATHETERIZATION  2014; 06/11/2016   CARDIAC CATHETERIZATION N/A 06/11/2016   Procedure: Left Heart Cath and Cors/Grafts Angiography;  Surgeon: Sherren Mocha, MD;  Location: [facility];  Service: Cardiovascular;  Laterality: N/A;   CESAREAN SECTION     CORONARY ANGIOPLASTY     CORONARY ARTERY BYPASS GRAFT  March, 2014   LIMA to LAD, left radial to LCx x 2   CYSTOSCOPY W/ URETERAL STENT PLACEMENT Left 01/16/2019   Procedure: CYSTOSCOPY WITH RETROGRADE PYELOGRAM/URETERAL LEFT STENT PLACEMENT;  Surgeon: Ardis Hughs, MD;  Location[facility];  Service: Urology;  Laterality: Left;   CYSTOSCOPY/URETEROSCOPY/HOLMIUM LASER/STENT PLACEMENT Left 01/24/2019   Procedure: CYSTOSCOPY, LEFT URETEROSCOPY, HOLMIUM LASER, STONE REMOVAL AND STENT EXCHANGE;  Surgeon: Ardis Hughs, MD;  [facility];  Service: Urology;  Laterality: Left;   LITHOTRIPSY  X 3   RIGHT/LEFT HEART CATH AND CORONARY/GRAFT ANGIOGRAPHY N/A 09/08/2018   Procedure: RIGHT/LEFT HEART CATH AND CORONARY/GRAFT ANGIOGRAPHY;  Surgeon: Martinique, Peter M, MD;  Location: [facility];  Service: Cardiovascular;  Laterality:  N/A;   TOTAL ABDOMINAL HYSTERECTOMY     ovaries took before hysterectomy   TUBAL LIGATION     URETERAL STENT PLACEMENT      There were no vitals filed for this visit.   Subjective Assessment - 04/13/21 0808     Subjective  Pt reports that she is folding towels    Pertinent History CVA with R-sided weakness/ataxia.  PMH:  CAD, HTN, DM, OSA, seizure disorder, dyslipidemia, migraine, CABG (2014), recent bilateral cataract surgeries just prior to CVA    Limitations fall risk, ataxia, visual deficits    Patient Stated Goals return to driving, return to living alone, cook    Currently in Pain? No/denies                Sitting, performing functional reaching to place small washers on pole (mid-high level reach) with incr time.  Performed simple, familiar cooking task with supervision and incr time (scrambled eggs).  Initial  min cueing for RW placement and to use as a cart to incr safety (with walker tray/seat).  Pt gathered needed items, used burner appropriately, used RUE appropriately, remembered to turn off burner and cleaned dishes/area after cooking.    Recommended pt begin to assist dtr in kitchen for simple cooking tasks with supervision and incr time.  Pt instructed to gather all needed items prior to starting task, RW placement to incr ease/safety from going to counter>open space. and to use walker seat/tray as a cart to carry items.  Recommended pt begin with light tasks such as washing dishes/vegetables, stirring, mixing, pancakes, eggs, etc and utilize help for hot, sharp, heavier tasks initially.  Pt verbalized understanding.           OT Short Term Goals - 04/08/21 0809       OT SHORT TERM GOAL #1   Title Pt will be independent with initial HEP.--Check initial STGs (#1-4)  04/09/21    Time 4    Period Weeks    Status Achieved      OT SHORT TERM GOAL #2   Title Pt will demo improved coordination and functional reach for ADLs as shown by improving score on box and blocks test by at least 6.    Baseline 42 blocks    Time 4    Period Weeks    Status On-going   03/30/21:  43 blocks     OT SHORT TERM GOAL #3   Title Pt will perform bathing with min A.    Time 4    Period Weeks    Status Achieved   03/30/21:  min A for washing, needs assist for transfer due to prefer bath     OT SHORT TERM GOAL #4   Title Pt will perofrm UB dressing mod I.    Time 4    Period Weeks    Status Achieved      OT SHORT TERM GOAL #5   Title Pt will perform LB dressing with min A.--check STGs #5-8  05/10/21    Time 8    Period Weeks    Status Achieved      OT SHORT TERM GOAL #6   Title Pt will  perform bathing with supervision except transfer (pt prefers bath and will need A due to safety, has been provided with info for tub transfer bench).    Time 8    Period Weeks    Status Deferred   04/06/21:  min A due to pt preference  OT SHORT TERM GOAL #7   Title Pt will peform simple home maintenane task and snack prep mod I.    Time 8    Period Weeks    Status Achieved   04/06/21:  pt performing simple tasks (dusting, making coffee,  microwaving, wiping counters)     OT SHORT TERM GOAL #8   Title Pt will cut food mod I using AE prn.    Time 8    Period Weeks    Status Achieved   met using rocker knife, info provided              OT Long Term Goals - 04/13/21 1207       OT LONG TERM GOAL #1   Title Pt will be independent with updated HEP--check LTGs 06/08/21    Time 12    Period Weeks    Status On-going      OT LONG TERM GOAL #2   Title Pt will perform BADLs mod I with AE/DME and strategies prn.    Time 12    Period Weeks    Status On-going      OT LONG TERM GOAL #3   Title Pt wil be for memory/cognitive compensation strategies for ADL/IADLs prn.    Time 12    Period Weeks    Status On-going   04/08/21- issued memory compensations     OT LONG TERM GOAL #4   Title Pt will perform simple cooking task with supervision.    Time 12    Period Weeks    Status On-going      OT LONG TERM GOAL #5   Title Pt will perform environmental scanning/navigation with at least 90% accuracy (with head turns) without reports of dizziness/LOB.    Time 12    Period Weeks    Status On-going      OT LONG TERM GOAL #6   Title Pt will improve RUE coordination as shown by completing 9-hole peg test in less than 36sec without drops.    Time 12    Period Weeks    Status On-going                   Plan - 04/13/21 0809     Clinical Impression Statement Pt is progressing towards goals.  Pt demo improving RUE control.  Pt able to complete simple, familiar cooking task with  supervision.    OT Occupational Profile and History Detailed Assessment- Review of Records and additional review of physical, cognitive, psychosocial history related to current functional performance    Occupational performance deficits (Please refer to evaluation for details): ADL's;IADL's;Leisure;Social Participation    Body Structure / Function / Physical Skills ADL;Decreased knowledge of use of DME;Strength;GMC;Dexterity;Balance;UE functional use;IADL;Endurance;ROM;Vision;Improper spinal/pelvic alignment;Coordination;Mobility;FMC    Cognitive Skills Attention;Memory;Understand    Rehab Potential Good    Clinical Decision Making Several treatment options, min-mod task modification necessary    Comorbidities Affecting Occupational Performance: May have comorbidities impacting occupational performance    Modification or Assistance to Complete Evaluation  Min-Moderate modification of tasks or assist with assess necessary to complete eval    OT Frequency 2x / week    OT Duration 12 weeks   +eval   OT Treatment/Interventions Self-care/ADL training;Moist Heat;DME and/or AE instruction;Balance training;Aquatic Therapy;Therapeutic activities;Cognitive remediation/compensation;Therapeutic exercise;Cryotherapy;Neuromuscular education;Functional Mobility Training;Passive range of motion;Visual/perceptual remediation/compensation;Patient/family education;Manual Therapy;Energy conservation;Paraffin;Fluidtherapy    Plan functional reaching, IADL task, environmental scanning, coordination    Consulted and Agree with Plan of Care Patient  Patient will benefit from skilled therapeutic intervention in order to improve the following deficits and impairments:   Body Structure / Function / Physical Skills: ADL, Decreased knowledge of use of DME, Strength, GMC, Dexterity, Balance, UE functional use, IADL, Endurance, ROM, Vision, Improper spinal/pelvic alignment, Coordination, Mobility, FMC Cognitive  Skills: Attention, Memory, Understand     Visit Diagnosis: Other lack of coordination  Other symptoms and signs involving cognitive functions following cerebral infarction  Visuospatial deficit  Attention and concentration deficit  Muscle weakness (generalized)  Ataxia  Other abnormalities of gait and mobility  Unsteadiness on feet    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[place]) 03/09/2021   Cerebellar stroke ([place]) 01/30/2021   Acute cerebrovascular accident (CVA) ([place]) 01/20/2021   Acute CVA (cerebrovascular accident) ([place]) 01/20/2021   GERD (gastroesophageal reflux disease)    Coronary artery disease    Depression 01/06/2020   Pneumonia 01/05/2020   History of chest pain 09/26/2018   Dyspnea on exertion 09/08/2018   Chest pain, rule out acute myocardial infarction 10/30/2017   Edema 01/25/2017   Tobacco abuse 06/10/2016   Headache 09/15/2015   OSA (obstructive sleep apnea) 07/30/2015   Nephrolithiasis 07/30/2015   Vasovagal syncope 07/30/2015   Carotid artery stenosis, asymptomatic 07/30/2015   CAD (coronary artery disease) 08/22/2013   Essential hypertension 08/22/2013   Diabetes mellitus without complication ([place])    Dyslipidemia    Seizures ([place])    Generalized convulsive epilepsy ([place]) 03/22/2013   Memory deficit 03/22/2013    [facility] 04/13/2021, 12:16 PM  [facility] [address] [place], [place], [address] Phone: [phone]   Fax:  [phone]  Name: Aiyannah Fayad MRN: [id number] Date of Birth: 04-03-1956  Vianne Bulls, OTR/L [facility] [address] [place], [place]  [address] [phone] phone [phone] 04/13/21 12:16 PM

## 2021-04-15 ENCOUNTER — Ambulatory Visit: Payer: Medicare HMO | Admitting: Occupational Therapy

## 2021-04-15 ENCOUNTER — Ambulatory Visit: Payer: Medicare HMO

## 2021-04-15 ENCOUNTER — Ambulatory Visit: Payer: Medicare HMO | Admitting: Physical Therapy

## 2021-04-15 ENCOUNTER — Encounter: Payer: Self-pay | Admitting: Physical Therapy

## 2021-04-15 ENCOUNTER — Other Ambulatory Visit: Payer: Self-pay

## 2021-04-15 DIAGNOSIS — R471 Dysarthria and anarthria: Secondary | ICD-10-CM

## 2021-04-15 DIAGNOSIS — R41842 Visuospatial deficit: Secondary | ICD-10-CM

## 2021-04-15 DIAGNOSIS — R2689 Other abnormalities of gait and mobility: Secondary | ICD-10-CM | POA: Diagnosis not present

## 2021-04-15 DIAGNOSIS — M6281 Muscle weakness (generalized): Secondary | ICD-10-CM

## 2021-04-15 DIAGNOSIS — R278 Other lack of coordination: Secondary | ICD-10-CM

## 2021-04-15 DIAGNOSIS — R482 Apraxia: Secondary | ICD-10-CM

## 2021-04-15 DIAGNOSIS — R2681 Unsteadiness on feet: Secondary | ICD-10-CM

## 2021-04-15 DIAGNOSIS — I69318 Other symptoms and signs involving cognitive functions following cerebral infarction: Secondary | ICD-10-CM

## 2021-04-15 DIAGNOSIS — R4701 Aphasia: Secondary | ICD-10-CM

## 2021-04-15 DIAGNOSIS — R41840 Attention and concentration deficit: Secondary | ICD-10-CM

## 2021-04-15 DIAGNOSIS — R41841 Cognitive communication deficit: Secondary | ICD-10-CM

## 2021-04-15 DIAGNOSIS — R270 Ataxia, unspecified: Secondary | ICD-10-CM

## 2021-04-15 NOTE — Patient Instructions (Signed)
  Please complete the assigned speech therapy homework prior to your next session and return it to the speech therapist at your next visit.  

## 2021-04-15 NOTE — Therapy (Signed)
[facility] [address] [place], [place], [address] Phone: [phone]   Fax:  [phone]  Speech Language Pathology Treatment  Patient Details  Name: Samantha Huffman MRN: [id number] Date of Birth: [birth year] Referring Provider (SLP): Lauraine Rinne, [place]   Encounter Date: 04/15/2021   End of Session - 04/15/21 2329     Visit Number 13    Number of Visits 25    Date for SLP Re-Evaluation 05/20/21    SLP Start Time 0935    SLP Stop Time  1016    SLP Time Calculation (min) 41 min    Activity Tolerance Patient tolerated treatment well             Past Medical History:  Diagnosis Date   Carotid artery stenosis    1-39% bilateral stenosis by dopplers 08/2020   Coronary artery disease    a. s/p CABG 10/2012.  cath 05/2016 with moderate LM stenosis, mild LCx and RCA stenosis and occluded LAD with patent LIMA to LAD, free radial to OM.  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   Dyslipidemia    Family history of adverse reaction to anesthesia    mother and sister ponv   Generalized convulsive epilepsy without mention of intractable epilepsy    GERD (gastroesophageal reflux disease)    History of kidney stones    Hypertension    Memory deficit    slight   Migraine    "controlled on daily RX " (06/11/2016)   Nephrolithiasis 07/30/2015   S/p lithotripsy x 3 and right and left ureter stents   OSA (obstructive sleep apnea)    cpap    PONV (postoperative nausea and vomiting)    Seizures ([place])    "controlled w/daily RX; started in 2012; dr thinks they might be from the migraines; last sz was early part of 2016" (06/11/2016)   Type II diabetes mellitus ([place])    metformin   Vasovagal syncope 07/30/2015   2011    Past Surgical History:  Procedure Laterality Date   CARDIAC CATHETERIZATION  2014; 06/11/2016   CARDIAC CATHETERIZATION N/A 06/11/2016   Procedure: Left Heart Cath and Cors/Grafts Angiography;  Surgeon:  Sherren Mocha, MD;  Location: [facility];  Service: Cardiovascular;  Laterality: N/A;   CESAREAN SECTION     CORONARY ANGIOPLASTY     CORONARY ARTERY BYPASS GRAFT  March, 2014   LIMA to LAD, left radial to LCx x 2   CYSTOSCOPY W/ URETERAL STENT PLACEMENT Left 01/16/2019   Procedure: CYSTOSCOPY WITH RETROGRADE PYELOGRAM/URETERAL LEFT STENT PLACEMENT;  Surgeon: Ardis Hughs, MD;  Location: [facility];  Service: Urology;  Laterality: Left;   CYSTOSCOPY/URETEROSCOPY/HOLMIUM LASER/STENT PLACEMENT Left 01/24/2019   Procedure: CYSTOSCOPY, LEFT URETEROSCOPY, HOLMIUM LASER, STONE REMOVAL AND STENT EXCHANGE;  Surgeon: Ardis Hughs, MD;  Location: [facility];  Service: Urology;  Laterality: Left;   LITHOTRIPSY  X 3   RIGHT/LEFT HEART CATH AND CORONARY/GRAFT ANGIOGRAPHY N/A 09/08/2018   Procedure: RIGHT/LEFT HEART CATH AND CORONARY/GRAFT ANGIOGRAPHY;  Surgeon: Martinique, Peter M, MD;  Location: [facility];  Service: Cardiovascular;  Laterality: N/A;   TOTAL ABDOMINAL HYSTERECTOMY     ovaries took before hysterectomy   TUBAL LIGATION     URETERAL STENT PLACEMENT      There were no vitals filed for this visit.   Subjective Assessment - 04/15/21 0942     Subjective My birthday is this [birth year] - tomorrow!    Currently in Pain? No/denies  ADULT SLP TREATMENT - 04/15/21 0945       General Information   Behavior/Cognition Alert;Pleasant mood;Cooperative;Distractible;Impulsive      Treatment Provided   Treatment provided Cognitive-Linquistic      Cognitive-Linquistic Treatment   Treatment focused on Dysarthria;Cognition    Skilled Treatment SLP asked pt how long her therapy schedule went out and she told SLP corrrectly (05-01-21). She told SLP she thinks she does not need more therapy. Pt maintained WNL voicing for verbal organiziational tasks, with some verbal errors requiring SLP mod A with comprehension of steps involved in written sequencing.       Assessment / Recommendations / Plan   Plan Continue with current plan of care      Progression Toward Goals   Progression toward goals Progressing toward goals              SLP Education - 04/15/21 2328     Education Details more therapy might be good for [employer]) Educated Patient    Methods Explanation    Comprehension Verbalized understanding              SLP Short Term Goals - 04/13/21 1013       SLP SHORT TERM GOAL #1   Title pt will engage in cognitive linguistic and speech strength/coordination testing and goals added as necessary    Status Achieved    Target Date 03/05/21      SLP SHORT TERM GOAL #2   Title using compensations, Samantha Huffman will remind family when she needs to take meds (pt is now dependent upon family) 80% of opportunities between 3 sessions    Status Not Met    Target Date 03/20/21      SLP SHORT TERM GOAL #3   Title pt will name 10 items in a simple category with rare verbal cues x3 sessions    Baseline 03-23-21    Status Not Met    Target Date 03/20/21      SLP SHORT TERM GOAL #4   Title pt will independently use anomia compensations in 5 minutes simple conversation x3 sessions    Baseline 03-12-21, 03-23-21    Status Not Met    Target Date 03/20/21      SLP SHORT TERM GOAL #5   Title pt will exhibit 100% intelligibility in 5 minutes simple conversation using speech compensations, x3 sessions    Status Partially Met    Target Date 03/20/21      SLP SHORT TERM GOAL #6   Title Samantha Huffman will complete a speech HEP with rare min A x2 sessions    Status Not Met    Target Date 03/20/21      SLP SHORT TERM GOAL #7   Title Pt will verbalize and demonstrate 3 attention/memory strategies to aid functioning home with min A over 2 sessions    Status Not Met    Target Date 03/20/21              SLP Long Term Goals - 04/15/21 2330       SLP LONG TERM GOAL #1   Title pt will complete speech HEP with occasional min A x3 sessions     Time 5    Period Weeks    Status On-going    Target Date 04/17/21      SLP LONG TERM GOAL #2   Title in 7 minutes simple conversation pt will exhibit functional fluid speech using compensations in 2 sessions  Time 5    Period Weeks    Status On-going    Target Date 04/17/21      SLP LONG TERM GOAL #3   Title in 8 minutes mod complex conversation pt will exhibit functional fluid speech using compensations in 2 sessions    Time 9    Period Weeks    Status Revised    Target Date 05/15/21      SLP LONG TERM GOAL #4   Title pt will complete VNeST and SFA tasks with occasoinal min A in 3 sessions    Time 5    Period Weeks    Status Revised    Target Date 05/15/21      SLP LONG TERM GOAL #5   Title Samantha Huffman will exhibit functional word-finding using compensations in 8 mintues mod complex conversation in 3 sessions    Baseline 03-30-21, 04-06-21    Time 9    Period Weeks    Status Revised    Target Date 05/15/21      SLP LONG TERM GOAL #6   Title Samantha Huffman will independently take meds for 10 days as reported by Samantha Huffman or family    Time 9    Period Weeks    Status Revised    Target Date 05/15/21      SLP LONG TERM GOAL #7   Title Pt will verbalize and demonstrate 3 attention/memory strategies to aid functioning at work and home with rare min A over 2 sessions    Time 9    Period Weeks    Status Revised    Target Date 05/15/21              Plan - 04/15/21 2329     Clinical Impression Statement 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 At this time, she is still dependent on family and friends, however they also require training for activities that Samantha Huffman can safely do and to help her progress to more independence. Continue skilled ST to maximize cognition, languae and intelligibility for safety, independence and to reduce caregiver burden.    Speech Therapy Frequency 2x / week    Duration 12 weeks    Treatment/Interventions Language facilitation;Cueing  hierarchy;SLP instruction and feedback;Cognitive reorganization;Compensatory strategies;Internal/external aids;Patient/family education;Functional tasks;Environmental controls;Oral motor exercises    Potential to Achieve Goals Good             Patient will benefit from skilled therapeutic intervention in order to improve the following deficits and impairments:   Aphasia  Cognitive communication deficit  Verbal apraxia  Dysarthria and anarthria    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[place]) 03/09/2021   Cerebellar stroke ([place]) 01/30/2021   Acute cerebrovascular accident (CVA) ([place]) 01/20/2021   Acute CVA (cerebrovascular accident) ([place]) 01/20/2021   GERD (gastroesophageal reflux disease)    Coronary artery disease    Depression 01/06/2020   Pneumonia 01/05/2020   History of chest pain 09/26/2018   Dyspnea on exertion 09/08/2018   Chest pain, rule out acute myocardial infarction 10/30/2017   Edema 01/25/2017   Tobacco abuse 06/10/2016   Headache 09/15/2015   OSA (obstructive sleep apnea) 07/30/2015   Nephrolithiasis 07/30/2015   Vasovagal syncope 07/30/2015   Carotid artery stenosis, asymptomatic 07/30/2015   CAD (coronary artery disease) 08/22/2013   Essential hypertension 08/22/2013   Diabetes mellitus without complication ([place])    Dyslipidemia    Seizures ([place])    Generalized convulsive epilepsy ([place]) 03/22/2013  Memory deficit 03/22/2013    [facility]. ,[place], [place]  04/15/2021, 11:32 PM  [facility] [address] [place], [place], [address] Phone: [phone]   Fax:  [phone]   Name: Samantha Huffman MRN: [id number] Date of Birth: [birth year]

## 2021-04-15 NOTE — Therapy (Signed)
[facility] [address] [place], [place], [address] Phone: [phone]   Fax:  [phone]  Occupational Therapy Treatment  Patient Details  Name: Samantha Huffman MRN: [id number] Date of Birth: [birth year] Referring Provider (OT): Danella Sensing, NP   Encounter Date: 04/15/2021   OT End of Session - 04/15/21 0841     Visit Number 13    Number of Visits 25    Date for OT Re-Evaluation 06/08/21    Authorization Type [employer] (recert completed 6/96/29 due to delay in start of OT due to schedule conflicts)    Authorization - Visit Number 13    Authorization - Number of Visits 20    Progress Note Due on Visit 10    OT Start Time 0805    OT Stop Time 0845    OT Time Calculation (min) 40 min    Equipment Utilized During Treatment **FOTO**    Activity Tolerance Patient tolerated treatment well    Behavior During Therapy [facility] for tasks assessed/performed             Past Medical History:  Diagnosis Date   Carotid artery stenosis    1-39% bilateral stenosis by dopplers 08/2020   Coronary artery disease    a. s/p CABG 10/2012.  cath 05/2016 with moderate LM stenosis, mild LCx and RCA stenosis and occluded LAD with patent LIMA to LAD, free radial to OM.  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   Dyslipidemia    Family history of adverse reaction to anesthesia    mother and sister ponv   Generalized convulsive epilepsy without mention of intractable epilepsy    GERD (gastroesophageal reflux disease)    History of kidney stones    Hypertension    Memory deficit    slight   Migraine    "controlled on daily RX " (06/11/2016)   Nephrolithiasis 07/30/2015   S/p lithotripsy x 3 and right and left ureter stents   OSA (obstructive sleep apnea)    cpap    PONV (postoperative nausea and vomiting)    Seizures ([place])    "controlled w/daily RX; started in 2012; dr thinks they might be from the migraines; last sz was early  part of 2016" (06/11/2016)   Type II diabetes mellitus ([place])    metformin   Vasovagal syncope 07/30/2015   2011    Past Surgical History:  Procedure Laterality Date   CARDIAC CATHETERIZATION  2014; 06/11/2016   CARDIAC CATHETERIZATION N/A 06/11/2016   Procedure: Left Heart Cath and Cors/Grafts Angiography;  Surgeon: Sherren Mocha, MD;  Location: [facility];  Service: Cardiovascular;  Laterality: N/A;   CESAREAN SECTION     CORONARY ANGIOPLASTY     CORONARY ARTERY BYPASS GRAFT  March, 2014   LIMA to LAD, left radial to LCx x 2   CYSTOSCOPY W/ URETERAL STENT PLACEMENT Left 01/16/2019   Procedure: CYSTOSCOPY WITH RETROGRADE PYELOGRAM/URETERAL LEFT STENT PLACEMENT;  Surgeon: Ardis Hughs, MD;  Location[facility];  Service: Urology;  Laterality: Left;   CYSTOSCOPY/URETEROSCOPY/HOLMIUM LASER/STENT PLACEMENT Left 01/24/2019   Procedure: CYSTOSCOPY, LEFT URETEROSCOPY, HOLMIUM LASER, STONE REMOVAL AND STENT EXCHANGE;  Surgeon: Ardis Hughs, MD;  Location: [facility];  Service: Urology;  Laterality: Left;   LITHOTRIPSY  X 3   RIGHT/LEFT HEART CATH AND CORONARY/GRAFT ANGIOGRAPHY N/A 09/08/2018   Procedure: RIGHT/LEFT HEART CATH AND CORONARY/GRAFT ANGIOGRAPHY;  Surgeon: Martinique, Peter M, MD;  Location: [facility];  Service: Cardiovascular;  Laterality:  N/A;   TOTAL ABDOMINAL HYSTERECTOMY     ovaries took before hysterectomy   TUBAL LIGATION     URETERAL STENT PLACEMENT      There were no vitals filed for this visit.  Pt denies pain today  Treatment: Standing at table rocking forwards and backwards for weightbearing through UE's, min facilitation/ v.c  standing to copy medium sized peg design with RUE, only 1 drop, min difficulty, then removing with in hand manipulation. Increased time required, supervision for balance  Arm bike x 6 mins level 3 for conditioning   Pt reports she has started assisting her daughter with cooking at home. Pt reports she feels good about  everything and will be ready for d/c 05/01/21.  Pt was provided with a plastic bag to carry items as pt was attempting to carry a notebook and water bottle in her hand along with walker. Pt was shown how to loop over walker for increased safety.                        OT Short Term Goals - 04/15/21 0839       OT SHORT TERM GOAL #1   Title Pt will be independent with initial HEP.--Check initial STGs (#1-4)  04/09/21    Time 4    Period Weeks    Status Achieved      OT SHORT TERM GOAL #2   Title Pt will demo improved coordination and functional reach for ADLs as shown by improving score on box and blocks test by at least 6.    Baseline 42 blocks    Time 4    Period Weeks    Status On-going   04/15/21 39 blocks     OT SHORT TERM GOAL #3   Title Pt will perform bathing with min A.    Time 4    Period Weeks    Status Achieved   03/30/21:  min A for washing, needs assist for transfer due to prefer bath     OT SHORT TERM GOAL #4   Title Pt will perofrm UB dressing mod I.    Time 4    Period Weeks    Status Achieved      OT SHORT TERM GOAL #5   Title Pt will perform LB dressing with min A.--check STGs #5-8  05/10/21    Time 8    Period Weeks    Status Achieved      OT SHORT TERM GOAL #6   Title Pt will perform bathing with supervision except transfer (pt prefers bath and will need A due to safety, has been provided with info for tub transfer bench).    Time 8    Period Weeks    Status Deferred   04/06/21:  min A due to pt preference     OT SHORT TERM GOAL #7   Title Pt will peform simple home maintenane task and snack prep mod I.    Time 8    Period Weeks    Status Achieved   04/06/21:  pt performing simple tasks (dusting, making coffee,  microwaving, wiping counters)     OT SHORT TERM GOAL #8   Title Pt will cut food mod I using AE prn.    Time 8    Period Weeks    Status Achieved   met using rocker knife, info provided              OT Long  Term  Goals - 04/15/21 0837       OT LONG TERM GOAL #1   Title Pt will be independent with updated HEP--check LTGs 06/08/21    Time 12    Period Weeks    Status On-going      OT LONG TERM GOAL #2   Title Pt will perform BADLs mod I with AE/DME and strategies prn.    Time 12    Period Weeks    Status On-going      OT LONG TERM GOAL #3   Title Pt wil be for memory/cognitive compensation strategies for ADL/IADLs prn.    Time 12    Period Weeks    Status On-going   04/08/21- issued memory compensations     OT LONG TERM GOAL #4   Title Pt will perform simple cooking task with supervision.    Time 12    Period Weeks    Status On-going      OT LONG TERM GOAL #5   Title Pt will perform environmental scanning/navigation with at least 90% accuracy (with head turns) without reports of dizziness/LOB.    Time 12    Period Weeks    Status On-going      OT LONG TERM GOAL #6   Title Pt will improve RUE coordination as shown by completing 9-hole peg test in less than 36sec without drops.    Time 12    Period Weeks    Status On-going   04/15/21-41.90                  Plan - 04/15/21 0841     Clinical Impression Statement Pt is progressing towards goals.  Pt demo improving RUE control and functional use.    OT Occupational Profile and History Detailed Assessment- Review of Records and additional review of physical, cognitive, psychosocial history related to current functional performance    Occupational performance deficits (Please refer to evaluation for details): ADL's;IADL's;Leisure;Social Participation    Body Structure / Function / Physical Skills ADL;Decreased knowledge of use of DME;Strength;GMC;Dexterity;Balance;UE functional use;IADL;Endurance;ROM;Vision;Improper spinal/pelvic alignment;Coordination;Mobility;FMC    Cognitive Skills Attention;Memory;Understand    Rehab Potential Good    Clinical Decision Making Several treatment options, min-mod task modification necessary     Comorbidities Affecting Occupational Performance: May have comorbidities impacting occupational performance    Modification or Assistance to Complete Evaluation  Min-Moderate modification of tasks or assist with assess necessary to complete eval    OT Frequency 2x / week    OT Duration 12 weeks   +eval   OT Treatment/Interventions Self-care/ADL training;Moist Heat;DME and/or AE instruction;Balance training;Aquatic Therapy;Therapeutic activities;Cognitive remediation/compensation;Therapeutic exercise;Cryotherapy;Neuromuscular education;Functional Mobility Training;Passive range of motion;Visual/perceptual remediation/compensation;Patient/family education;Manual Therapy;Energy conservation;Paraffin;Fluidtherapy    Plan environmental scanning with head turns to work towards long term goal., functional reaching, ADL strategies.    Consulted and Agree with Plan of Care Patient             Patient will benefit from skilled therapeutic intervention in order to improve the following deficits and impairments:   Body Structure / Function / Physical Skills: ADL, Decreased knowledge of use of DME, Strength, GMC, Dexterity, Balance, UE functional use, IADL, Endurance, ROM, Vision, Improper spinal/pelvic alignment, Coordination, Mobility, FMC Cognitive Skills: Attention, Memory, Understand     Visit Diagnosis: Other lack of coordination  Other symptoms and signs involving cognitive functions following cerebral infarction  Visuospatial deficit  Attention and concentration deficit  Muscle weakness (generalized)  Ataxia    Problem List Patient Active  Problem List   Diagnosis Date Noted   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   Cerebellar stroke ([place]) 01/30/2021   Acute cerebrovascular accident (CVA) ([place]) 01/20/2021   Acute CVA (cerebrovascular accident) ([place]) 01/20/2021   GERD (gastroesophageal reflux disease)    Coronary artery disease    Depression 01/06/2020   Pneumonia 01/05/2020    History of chest pain 09/26/2018   Dyspnea on exertion 09/08/2018   Chest pain, rule out acute myocardial infarction 10/30/2017   Edema 01/25/2017   Tobacco abuse 06/10/2016   Headache 09/15/2015   OSA (obstructive sleep apnea) 07/30/2015   Nephrolithiasis 07/30/2015   Vasovagal syncope 07/30/2015   Carotid artery stenosis, asymptomatic 07/30/2015   CAD (coronary artery disease) 08/22/2013   Essential hypertension 08/22/2013   Diabetes mellitus without complication ([place])    Dyslipidemia    Seizures ([place])    Generalized convulsive epilepsy ([place]) 03/22/2013   Memory deficit 03/22/2013    Samantha Huffman 04/15/2021, 12:13 PM  [facility] [address] [place], [place], [address] Phone: [phone]   Fax:  [phone]  Name: Samantha Huffman MRN: [id number] Date of Birth: 1955/09/19

## 2021-04-15 NOTE — Therapy (Signed)
[facility] [address] [place], [place], [address] Phone: [phone]   Fax:  [phone]  Physical Therapy Treatment  Patient Details  Name: Samantha Huffman MRN: [id number] Date of Birth: 16-Jun-1956 Referring Provider (PT): Lauraine Rinne, PA-C   Encounter Date: 04/15/2021   PT End of Session - 04/15/21 0950     Visit Number 13    Number of Visits 25    Date for PT Re-Evaluation 05/15/21    Authorization Type [employer]    Authorization Time Period 02-19-21 - 05-21-21    Progress Note Due on Visit 10    PT Start Time 0845    PT Stop Time 0928    PT Time Calculation (min) 43 min    Equipment Utilized During Treatment Gait belt    Activity Tolerance Patient tolerated treatment well    Behavior During Therapy [facility] for tasks assessed/performed             Past Medical History:  Diagnosis Date   Carotid artery stenosis    1-39% bilateral stenosis by dopplers 08/2020   Coronary artery disease    a. s/p CABG 10/2012.  cath 05/2016 with moderate LM stenosis, mild LCx and RCA stenosis and occluded LAD with patent LIMA to LAD, free radial to OM.  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   Dyslipidemia    Family history of adverse reaction to anesthesia    mother and sister ponv   Generalized convulsive epilepsy without mention of intractable epilepsy    GERD (gastroesophageal reflux disease)    History of kidney stones    Hypertension    Memory deficit    slight   Migraine    "controlled on daily RX " (06/11/2016)   Nephrolithiasis 07/30/2015   S/p lithotripsy x 3 and right and left ureter stents   OSA (obstructive sleep apnea)    cpap    PONV (postoperative nausea and vomiting)    Seizures ([place])    "controlled w/daily RX; started in 2012; dr thinks they might be from the migraines; last sz was early part of 2016" (06/11/2016)   Type II diabetes mellitus ([place])    metformin   Vasovagal syncope 07/30/2015    2011    Past Surgical History:  Procedure Laterality Date   CARDIAC CATHETERIZATION  2014; 06/11/2016   CARDIAC CATHETERIZATION N/A 06/11/2016   Procedure: Left Heart Cath and Cors/Grafts Angiography;  Surgeon: Sherren Mocha, MD;  Location: [facility];  Service: Cardiovascular;  Laterality: N/A;   CESAREAN SECTION     CORONARY ANGIOPLASTY     CORONARY ARTERY BYPASS GRAFT  March, 2014   LIMA to LAD, left radial to LCx x 2   CYSTOSCOPY W/ URETERAL STENT PLACEMENT Left 01/16/2019   Procedure: CYSTOSCOPY WITH RETROGRADE PYELOGRAM/URETERAL LEFT STENT PLACEMENT;  Surgeon: Ardis Hughs, MD;  Location: [facility];  Service: Urology;  Laterality: Left;   CYSTOSCOPY/URETEROSCOPY/HOLMIUM LASER/STENT PLACEMENT Left 01/24/2019   Procedure: CYSTOSCOPY, LEFT URETEROSCOPY, HOLMIUM LASER, STONE REMOVAL AND STENT EXCHANGE;  Surgeon: Ardis Hughs, MD;  Location: [facility];  Service: Urology;  Laterality: Left;   LITHOTRIPSY  X 3   RIGHT/LEFT HEART CATH AND CORONARY/GRAFT ANGIOGRAPHY N/A 09/08/2018   Procedure: RIGHT/LEFT HEART CATH AND CORONARY/GRAFT ANGIOGRAPHY;  Surgeon: Martinique, Peter M, MD;  Location: [facility];  Service: Cardiovascular;  Laterality: N/A;   TOTAL ABDOMINAL HYSTERECTOMY     ovaries took before hysterectomy   TUBAL LIGATION  URETERAL STENT PLACEMENT      There were no vitals filed for this visit.   Subjective Assessment - 04/15/21 0847     Subjective No falls. Rudene Anda is tomorrow.    Pertinent History Carotid artery stenosis, HTN, epilepsy with seizure disorder - last seizure was in early 2016:  migraines, DM, memory deficit, CAD,    Diagnostic tests CT of head and neck:  MRA    Patient Stated Goals Improve balance and walking    Currently in Pain? No/denies                               [facility] Adult PT Treatment/Exercise - 04/15/21 0848       Ambulation/Gait   Ambulation/Gait Yes    Ambulation/Gait Assistance 4: Min guard;5:  Supervision    Ambulation/Gait Assistance Details with 2# ankle weight and carrying 2# dumbbell, cues to keep RLE in neutral, plus additional gait throughout session between activities with no AD   Ambulation Distance (Feet) 345 Feet    Assistive device None    Gait Pattern Step-through pattern;Ataxic    Ambulation Surface Level;Indoor                 Balance Exercises - 04/15/21 0855       Balance Exercises: Standing   Standing Eyes Closed Foam/compliant surface;Solid surface    Standing Eyes Closed Limitations feet together eyes closed on level ground x5 reps head turns, x5 reps head nods, on blue air ex: more narrow BOS 3 x 30 seconds, with feet hip width x5 reps head nods, x5 reps head turns    SLS with Vectors Solid surface;Foam/compliant surface    SLS with Vectors Limitations standing on floor with RLE stepping LLE over and back to midline over taller orange hurdle x12 reps, then performed with RLE as stance leg on blue air ex and stepping LLE over and back over smaller orange hurdle.standing on rockerboard with 2 cones anteriorly: alternating toe taps between R/L x10 reps, then performed stance on RLE and tapping forward/cross body cone tap x5 reps, repeated with LLE as stance leg x5 reps for RLE coordination    Rockerboard Anterior/posterior;EO;Limitations    Rockerboard Limitations with RLE as stance leg, stepping LLE off posteriorly x10 reps no UE support, then performing LLE anteriorly x10 reps with use of single UE support for balance                 PT Short Term Goals - 04/13/21 2119       PT SHORT TERM GOAL #1   Title Improve Berg score from 34/56 to >/= 39/56 for reduced fall risk.    Baseline 03/18/21: 50/56 scored today    Status Achieved      PT SHORT TERM GOAL #2   Title Improve TUG score from 17.32 secs to </= 14.5 secs without use of device to increase safety with ambulation.    Baseline 03/18/21: 14.41 secs with RW, 15.18 sec's no AD, met with RW  not no AD    Status On-going    Target Date 04/17/21      PT SHORT TERM GOAL #3   Title Increase gait velocity from 2.25 ft/sec without device to >/= 2.6 ft/sec with CGA for incr. gait efficiency.    Baseline 27/27/22: 2.41 ft/sec with RW with supervision, 2.65 ft/sec no AD with min guard assist, met with no AD    Status Achieved  PT SHORT TERM GOAL #4   Title Assess DGI and establish goal as appropriate. Updated STG:  Increase DGI score from 13/24 to >/= 18/24 to reduce fall risk.    Baseline 03/20/21: 13/24 scored today as baseline, baseline added to LTG    Status New    Target Date 04/17/21      PT SHORT TERM GOAL #5   Title Pt wil amb. 350' with CGA on flat, even surface without LOB.; Updated STG;  Amb. 350' with supervision without use of RW, including amb. to all therapies without RW without LOB.    Baseline 03/18/21: met in session with RW    Status New    Target Date 04/17/21      PT SHORT TERM GOAL #6   Title Independent in HEP for balance and strengthening exercises.    Baseline 03/18/21: met with current HEP per pt report    Status Achieved    Target Date 03/20/21               PT Long Term Goals - 04/13/21 0857       PT LONG TERM GOAL #1   Title Improve Berg score from 34/56 to >/= 50/56 to reduce fall risk.. (all LTGs due 05/22/21)    Baseline 34/56 on 02-19-21    Time 12    Period Weeks    Status On-going      PT LONG TERM GOAL #2   Title Pt will ambulate 1000' modified independently without device on even and uneven surfaces.    Time 12    Period Weeks    Status On-going      PT LONG TERM GOAL #3   Title Improve TUG score from 17.32 secs to </= 13 secs to reduce fall risk.    Baseline 17.32 secs on 02-19-21 with CGA    Time 12    Period Weeks    Status On-going      PT LONG TERM GOAL #4   Title Improve DGI by at least 10 points to increase safety with gait.    Baseline 03/20/21: 13/24    Time 12    Period Weeks    Status On-going      PT  LONG TERM GOAL #5   Title Independent in updated HEP as appropriate.    Time 12    Period Weeks    Status On-going      PT LONG TERM GOAL #6   Title Increase LE FOTO score by at least 10 points to demo improvement in functional status.    Baseline 59/100 on 02-19-21    Time 12    Period Weeks    Status On-going                   Plan - 04/15/21 0953     Clinical Impression Statement Continued to work on gait with no AD with use of 2# ankle weight on RLE to decr ataxia, needs cues throughout to keep RLE in neutral position, but did appear better today with pt wearing sneakers. Remainder of session focused on standing balance strategies with focus on SLS on RLE on level/compliant surfaces, RLE coordination, and balance with vision removed. Will continue to progress towards LTGs.    Personal Factors and Comorbidities Comorbidity 2;Past/Current Experience;Transportation    Comorbidities migraines, epilepsy with seizure disorder, DM, CAD, s/p cataract sx on bil. eyes, memory deficit    Examination-Activity Limitations Bathing;Stand;Transfers;Carry;Squat;Stairs;Locomotion Level;Hygiene/Grooming;Bend    Examination-Participation Restrictions Cleaning;Community  Activity;Driving;Laundry;Shop;Meal Prep    Stability/Clinical Decision Making Evolving/Moderate complexity    Rehab Potential Good    PT Frequency 2x / week    PT Duration 12 weeks    PT Treatment/Interventions ADLs/Self Care Home Management;Aquatic Therapy;DME Instruction;Gait training;Stair training;Therapeutic activities;Therapeutic exercise;Balance training;Neuromuscular re-education;Patient/family education    PT Next Visit Plan check STGs (didn't realize until after session that they were due). Cont gait training without RW, balance and coordination exercises; RLE strengthening    PT Home Exercise Plan balance HEP issued on 03-12-21             Patient will benefit from skilled therapeutic intervention in order to  improve the following deficits and impairments:  Abnormal gait, Decreased activity tolerance, Decreased endurance, Decreased balance, Decreased coordination, Decreased strength, Impaired UE functional use  Visit Diagnosis: Other lack of coordination  Unsteadiness on feet  Muscle weakness (generalized)  Other abnormalities of gait and mobility     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   Cerebellar stroke ([place]) 01/30/2021   Acute cerebrovascular accident (CVA) ([place]) 01/20/2021   Acute CVA (cerebrovascular accident) ([place]) 01/20/2021   GERD (gastroesophageal reflux disease)    Coronary artery disease    Depression 01/06/2020   Pneumonia 01/05/2020   History of chest pain 09/26/2018   Dyspnea on exertion 09/08/2018   Chest pain, rule out acute myocardial infarction 10/30/2017   Edema 01/25/2017   Tobacco abuse 06/10/2016   Headache 09/15/2015   OSA (obstructive sleep apnea) 07/30/2015   Nephrolithiasis 07/30/2015   Vasovagal syncope 07/30/2015   Carotid artery stenosis, asymptomatic 07/30/2015   CAD (coronary artery disease) 08/22/2013   Essential hypertension 08/22/2013   Diabetes mellitus without complication ([place])    Dyslipidemia    Seizures ([place])    Generalized convulsive epilepsy ([place]) 03/22/2013   Memory deficit 03/22/2013    Arliss Journey, PT, DPT  04/15/2021, 9:55 AM  [facility] [address] [place], [place], [address] Phone: [phone]   Fax:  [phone]  Name: Samantha Huffman MRN: [id number] Date of Birth: [birth year]

## 2021-04-21 ENCOUNTER — Ambulatory Visit: Payer: Medicare HMO | Admitting: Physical Therapy

## 2021-04-21 ENCOUNTER — Ambulatory Visit: Payer: Medicare HMO | Admitting: Occupational Therapy

## 2021-04-21 ENCOUNTER — Ambulatory Visit: Payer: Medicare HMO

## 2021-04-21 ENCOUNTER — Other Ambulatory Visit: Payer: Self-pay

## 2021-04-21 ENCOUNTER — Other Ambulatory Visit: Payer: Self-pay | Admitting: Neurology

## 2021-04-21 DIAGNOSIS — I6312 Cerebral infarction due to embolism of basilar artery: Secondary | ICD-10-CM

## 2021-04-21 DIAGNOSIS — R471 Dysarthria and anarthria: Secondary | ICD-10-CM

## 2021-04-21 DIAGNOSIS — R2681 Unsteadiness on feet: Secondary | ICD-10-CM

## 2021-04-21 DIAGNOSIS — R278 Other lack of coordination: Secondary | ICD-10-CM

## 2021-04-21 DIAGNOSIS — R41842 Visuospatial deficit: Secondary | ICD-10-CM

## 2021-04-21 DIAGNOSIS — R2689 Other abnormalities of gait and mobility: Secondary | ICD-10-CM

## 2021-04-21 DIAGNOSIS — R41840 Attention and concentration deficit: Secondary | ICD-10-CM

## 2021-04-21 DIAGNOSIS — I639 Cerebral infarction, unspecified: Secondary | ICD-10-CM

## 2021-04-21 DIAGNOSIS — R270 Ataxia, unspecified: Secondary | ICD-10-CM

## 2021-04-21 DIAGNOSIS — I69318 Other symptoms and signs involving cognitive functions following cerebral infarction: Secondary | ICD-10-CM

## 2021-04-21 DIAGNOSIS — R482 Apraxia: Secondary | ICD-10-CM

## 2021-04-21 DIAGNOSIS — M6281 Muscle weakness (generalized): Secondary | ICD-10-CM

## 2021-04-21 DIAGNOSIS — R9431 Abnormal electrocardiogram [ECG] [EKG]: Secondary | ICD-10-CM

## 2021-04-21 DIAGNOSIS — I4891 Unspecified atrial fibrillation: Secondary | ICD-10-CM

## 2021-04-21 DIAGNOSIS — R41841 Cognitive communication deficit: Secondary | ICD-10-CM

## 2021-04-21 NOTE — Therapy (Signed)
[facility] [address] [place], [place], [address] Phone: [phone]   Fax:  [phone]  Speech Language Pathology Treatment  Patient Details  Name: Samantha Huffman MRN: [id number] Date of Birth: 1956/07/17 Referring Provider (SLP): Lauraine Rinne, [place]   Encounter Date: 04/21/2021   End of Session - 04/21/21 1016     Visit Number 14    Number of Visits 25    Date for SLP Re-Evaluation 05/20/21    SLP Start Time 0849    SLP Stop Time  0930    SLP Time Calculation (min) 41 min    Activity Tolerance Patient tolerated treatment well             Past Medical History:  Diagnosis Date   Carotid artery stenosis    1-39% bilateral stenosis by dopplers 08/2020   Coronary artery disease    a. s/p CABG 10/2012.  cath 05/2016 with moderate LM stenosis, mild LCx and RCA stenosis and occluded LAD with patent LIMA to LAD, free radial to OM.  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   Dyslipidemia    Family history of adverse reaction to anesthesia    mother and sister ponv   Generalized convulsive epilepsy without mention of intractable epilepsy    GERD (gastroesophageal reflux disease)    History of kidney stones    Hypertension    Memory deficit    slight   Migraine    "controlled on daily RX " (06/11/2016)   Nephrolithiasis 07/30/2015   S/p lithotripsy x 3 and right and left ureter stents   OSA (obstructive sleep apnea)    cpap    PONV (postoperative nausea and vomiting)    Seizures ([place])    "controlled w/daily RX; started in 2012; dr thinks they might be from the migraines; last sz was early part of 2016" (06/11/2016)   Type II diabetes mellitus ([place])    metformin   Vasovagal syncope 07/30/2015   2011    Past Surgical History:  Procedure Laterality Date   CARDIAC CATHETERIZATION  2014; 06/11/2016   CARDIAC CATHETERIZATION N/A 06/11/2016   Procedure: Left Heart Cath and Cors/Grafts Angiography;  Surgeon:  Sherren Mocha, MD;  Location: [facility];  Service: Cardiovascular;  Laterality: N/A;   CESAREAN SECTION     CORONARY ANGIOPLASTY     CORONARY ARTERY BYPASS GRAFT  March, 2014   LIMA to LAD, left radial to LCx x 2   CYSTOSCOPY W/ URETERAL STENT PLACEMENT Left 01/16/2019   Procedure: CYSTOSCOPY WITH RETROGRADE PYELOGRAM/URETERAL LEFT STENT PLACEMENT;  Surgeon: Ardis Hughs, MD;  [facility];  Service: Urology;  Laterality: Left;   CYSTOSCOPY/URETEROSCOPY/HOLMIUM LASER/STENT PLACEMENT Left 01/24/2019   Procedure: CYSTOSCOPY, LEFT URETEROSCOPY, HOLMIUM LASER, STONE REMOVAL AND STENT EXCHANGE;  Surgeon: Ardis Hughs, MD;  [facility];  Service: Urology;  Laterality: Left;   LITHOTRIPSY  X 3   RIGHT/LEFT HEART CATH AND CORONARY/GRAFT ANGIOGRAPHY N/A 09/08/2018   Procedure: RIGHT/LEFT HEART CATH AND CORONARY/GRAFT ANGIOGRAPHY;  Surgeon: Martinique, Peter M, MD;  Location: [facility];  Service: Cardiovascular;  Laterality: N/A;   TOTAL ABDOMINAL HYSTERECTOMY     ovaries took before hysterectomy   TUBAL LIGATION     URETERAL STENT PLACEMENT      There were no vitals filed for this visit.   Subjective Assessment - 04/21/21 0900     Subjective Pt thought she had PT first - came to [facility] at 0850.  ADULT SLP TREATMENT - 04/21/21 0908       General Information   Behavior/Cognition Alert;Pleasant mood;Cooperative;Distractible;Impulsive      Treatment Provided   Treatment provided Cognitive-Linquistic      Cognitive-Linquistic Treatment   Treatment focused on Dysarthria;Cognition    Skilled Treatment In pt's explanation of her homework, voice got very hgh pitched. With a nonverbal cue pt self-corrected her voice. SLP engaged pt in min-mod complex conversation for 7-10 minutes x3, targeting dysarthria. Occasional/usual cues for slowing rate necessary. Pt denies difficulty with anomia.      Assessment / Recommendations / Plan   Plan  Continue with current plan of care   SLP talked with pt about continuing after last scheduled ST - she did not want to continue stating satisfaction with current ability     Progression Toward Goals   Progression toward goals Progressing toward goals                SLP Short Term Goals - 04/13/21 1013       SLP SHORT TERM GOAL #1   Title pt will engage in cognitive linguistic and speech strength/coordination testing and goals added as necessary    Status Achieved    Target Date 03/05/21      SLP SHORT TERM GOAL #2   Title using compensations, Samantha Huffman will remind family when she needs to take meds (pt is now dependent upon family) 80% of opportunities between 3 sessions    Status Not Met    Target Date 03/20/21      SLP SHORT TERM GOAL #3   Title pt will name 10 items in a simple category with rare verbal cues x3 sessions    Baseline 03-23-21    Status Not Met    Target Date 03/20/21      SLP SHORT TERM GOAL #4   Title pt will independently use anomia compensations in 5 minutes simple conversation x3 sessions    Baseline 03-12-21, 03-23-21    Status Not Met    Target Date 03/20/21      SLP SHORT TERM GOAL #5   Title pt will exhibit 100% intelligibility in 5 minutes simple conversation using speech compensations, x3 sessions    Status Partially Met    Target Date 03/20/21      SLP SHORT TERM GOAL #6   Title Samantha Huffman will complete a speech HEP with rare min A x2 sessions    Status Not Met    Target Date 03/20/21      SLP SHORT TERM GOAL #7   Title Pt will verbalize and demonstrate 3 attention/memory strategies to aid functioning home with min A over 2 sessions    Status Not Met    Target Date 03/20/21              SLP Long Term Goals - 04/21/21 0930       SLP LONG TERM GOAL #1   Title pt will complete speech HEP with occasional min A x3 sessions    Time 5    Period Weeks    Status On-going      SLP LONG TERM GOAL #2   Title in 7 minutes simple conversation pt  will exhibit functional fluid speech using compensations in 2 sessions    Baseline oo    Time 5    Period Weeks    Status On-going      SLP LONG TERM GOAL #3   Title in 8 minutes mod complex conversation  pt will exhibit functional fluid speech using compensations in 2 sessions    Time 9    Period Weeks    Status Revised      SLP LONG TERM GOAL #4   Title pt will complete VNeST and SFA tasks with occasoinal min A in 3 sessions    Time 5    Period Weeks    Status Deferred   pt denies anomia/dysnomia     SLP LONG TERM GOAL #5   Title Samantha Huffman will exhibit functional word-finding using compensations in 8 mintues mod complex conversation in 3 sessions    Baseline 03-30-21, 04-06-21    Status Achieved      SLP LONG TERM GOAL #6   Title Samantha Huffman will independently take meds for 10 days as reported by Samantha Huffman or family    Status Achieved   per pt report     SLP LONG TERM GOAL #7   Title Pt will verbalize and demonstrate 3 attention/memory strategies to aid functioning at work and home with rare min A over 2 sessions    Time 9    Period Weeks    Status Revised              Plan - 04/21/21 1016     Clinical Impression Statement 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 At this time, she is still dependent on family and friends, however they also require training for activities that Samantha Huffman can safely do and to help her progress to more independence. Continue skilled ST to maximize cognition, languae and intelligibility for safety, independence and to reduce caregiver burden. Pt stated today she is pleased with current functioning and does not desire more ST after current schedule runs out.    Speech Therapy Frequency 2x / week    Duration 12 weeks    Treatment/Interventions Language facilitation;Cueing hierarchy;SLP instruction and feedback;Cognitive reorganization;Compensatory strategies;Internal/external aids;Patient/family education;Functional tasks;Environmental  controls;Oral motor exercises    Potential to Achieve Goals Good             Patient will benefit from skilled therapeutic intervention in order to improve the following deficits and impairments:   Dysarthria and anarthria  Verbal apraxia  Cognitive communication deficit    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   Cerebellar stroke ([place]) 01/30/2021   Acute cerebrovascular accident (CVA) ([place]) 01/20/2021   Acute CVA (cerebrovascular accident) ([place]) 01/20/2021   GERD (gastroesophageal reflux disease)    Coronary artery disease    Depression 01/06/2020   Pneumonia 01/05/2020   History of chest pain 09/26/2018   Dyspnea on exertion 09/08/2018   Chest pain, rule out acute myocardial infarction 10/30/2017   Edema 01/25/2017   Tobacco abuse 06/10/2016   Headache 09/15/2015   OSA (obstructive sleep apnea) 07/30/2015   Nephrolithiasis 07/30/2015   Vasovagal syncope 07/30/2015   Carotid artery stenosis, asymptomatic 07/30/2015   CAD (coronary artery disease) 08/22/2013   Essential hypertension 08/22/2013   Diabetes mellitus without complication ([place])    Dyslipidemia    Seizures ([place])    Generalized convulsive epilepsy ([place]) 03/22/2013   Memory deficit 03/22/2013    [facility] ,[place], [place]  04/21/2021, 10:17 AM  [facility] [address] [place], [place], [address] Phone: [phone]   Fax:  [phone]   Name: Samantha Huffman MRN: [id number] Date of Birth: Jan 27, 1956

## 2021-04-21 NOTE — Patient Instructions (Signed)
Shoulder Push-Up (Prone on Elbows)    With elbows placed under shoulders, rise up on elbows as high as possible. Keep hips on surface and back arched. Perform in the middle of your bed. Hold __5__ seconds. Repeat _10___ times. Do __1-2__ sessions per day.  Perform in the middle of your bed, same position as above,  then alternate reaching forward with left then right arm 10 reps, keep your chest up. Copyright  [employer]. All rights reserved.

## 2021-04-21 NOTE — Therapy (Signed)
[facility] [address] [place], [place], [address] Phone: [phone]   Fax:  [phone]  Occupational Therapy Treatment  Patient Details  Name: Samantha Huffman MRN: [id number] Date of Birth: [birth year] Referring Provider (OT): Danella Sensing, NP   Encounter Date: 04/21/2021   OT End of Session - 04/21/21 0808     Visit Number 14    Number of Visits 25    Date for OT Re-Evaluation 06/08/21    Authorization Type [employer] Medicare (recert completed 6/96/29 due to delay in start of OT due to schedule conflicts)    Authorization - Visit Number 14    Authorization - Number of Visits 20    OT Start Time 0803    OT Stop Time 0843    OT Time Calculation (min) 40 min    Behavior During Therapy [facility] for tasks assessed/performed             Past Medical History:  Diagnosis Date   Carotid artery stenosis    1-39% bilateral stenosis by dopplers 08/2020   Coronary artery disease    a. s/p CABG 10/2012.  cath 05/2016 with moderate LM stenosis, mild LCx and RCA stenosis and occluded LAD with patent LIMA to LAD, free radial to OM.  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   Dyslipidemia    Family history of adverse reaction to anesthesia    mother and sister ponv   Generalized convulsive epilepsy without mention of intractable epilepsy    GERD (gastroesophageal reflux disease)    History of kidney stones    Hypertension    Memory deficit    slight   Migraine    "controlled on daily RX " (06/11/2016)   Nephrolithiasis 07/30/2015   S/p lithotripsy x 3 and right and left ureter stents   OSA (obstructive sleep apnea)    cpap    PONV (postoperative nausea and vomiting)    Seizures ([place])    "controlled w/daily RX; started in 2012; dr thinks they might be from the migraines; last sz was early part of 2016" (06/11/2016)   Type II diabetes mellitus ([place])    metformin   Vasovagal syncope 07/30/2015   2011    Past Surgical  History:  Procedure Laterality Date   CARDIAC CATHETERIZATION  2014; 06/11/2016   CARDIAC CATHETERIZATION N/A 06/11/2016   Procedure: Left Heart Cath and Cors/Grafts Angiography;  Surgeon: Sherren Mocha, MD;  Location: [facility];  Service: Cardiovascular;  Laterality: N/A;   CESAREAN SECTION     CORONARY ANGIOPLASTY     CORONARY ARTERY BYPASS GRAFT  March, 2014   LIMA to LAD, left radial to LCx x 2   CYSTOSCOPY W/ URETERAL STENT PLACEMENT Left 01/16/2019   Procedure: CYSTOSCOPY WITH RETROGRADE PYELOGRAM/URETERAL LEFT STENT PLACEMENT;  Surgeon: Ardis Hughs, MD;  [facility];  Service: Urology;  Laterality: Left;   CYSTOSCOPY/URETEROSCOPY/HOLMIUM LASER/STENT PLACEMENT Left 01/24/2019   Procedure: CYSTOSCOPY, LEFT URETEROSCOPY, HOLMIUM LASER, STONE REMOVAL AND STENT EXCHANGE;  Surgeon: Ardis Hughs, MD;  [facility];  Service: Urology;  Laterality: Left;   LITHOTRIPSY  X 3   RIGHT/LEFT HEART CATH AND CORONARY/GRAFT ANGIOGRAPHY N/A 09/08/2018   Procedure: RIGHT/LEFT HEART CATH AND CORONARY/GRAFT ANGIOGRAPHY;  Surgeon: Martinique, Peter M, MD;  Location: [facility];  Service: Cardiovascular;  Laterality: N/A;   TOTAL ABDOMINAL HYSTERECTOMY     ovaries took before hysterectomy   TUBAL LIGATION     URETERAL STENT PLACEMENT  There were no vitals filed for this visit.   Subjective Assessment - 04/21/21 0806     Subjective  Pt reports leg pain    Pertinent History CVA with R-sided weakness/ataxia.  PMH:  CAD, HTN, DM, OSA, seizure disorder, dyslipidemia, migraine, CABG (2014), recent bilateral cataract surgeries just prior to CVA    Limitations fall risk, ataxia, visual deficits    Patient Stated Goals return to driving, return to living alone, cook    Currently in Pain? Yes    Pain Score 2     Pain Location Leg    Pain Orientation Right;Left    Pain Descriptors / Indicators Aching    Pain Type Acute pain    Pain Onset More than a month ago    Pain  Frequency Intermittent    Aggravating Factors  walking    Pain Relieving Factors rest                        Treatment: Placing an removing grooved pegs from pegboard with RUE, min v.c for positioning and resting elbow on table. Environmental scanning with head turns 14/15 items located first pass, no reports of dizziness. Arm bike x 8 min level 3 for conditioning.          OT Education - 04/21/21 [id number]     Education Details reviewed previously issued weightbearing HEP, and added prone on elbows exercises. Pt returned demonstration following instructions.    Person(s) Educated Patient    Methods Explanation;Handout;Verbal cues;Demonstration    Comprehension Verbalized understanding;Returned demonstration;Verbal cues required              OT Short Term Goals - 04/15/21 0839       OT SHORT TERM GOAL #1   Title Pt will be independent with initial HEP.--Check initial STGs (#1-4)  04/09/21    Time 4    Period Weeks    Status Achieved      OT SHORT TERM GOAL #2   Title Pt will demo improved coordination and functional reach for ADLs as shown by improving score on box and blocks test by at least 6.    Baseline 42 blocks    Time 4    Period Weeks    Status On-going   04/15/21 39 blocks     OT SHORT TERM GOAL #3   Title Pt will perform bathing with min A.    Time 4    Period Weeks    Status Achieved   03/30/21:  min A for washing, needs assist for transfer due to prefer bath     OT SHORT TERM GOAL #4   Title Pt will perofrm UB dressing mod I.    Time 4    Period Weeks    Status Achieved      OT SHORT TERM GOAL #5   Title Pt will perform LB dressing with min A.--check STGs #5-8  05/10/21    Time 8    Period Weeks    Status Achieved      OT SHORT TERM GOAL #6   Title Pt will perform bathing with supervision except transfer (pt prefers bath and will need A due to safety, has been provided with info for tub transfer bench).    Time 8    Period Weeks     Status Deferred   04/06/21:  min A due to pt preference     OT SHORT TERM GOAL #7   Title Pt will  peform simple home maintenane task and snack prep mod I.    Time 8    Period Weeks    Status Achieved   04/06/21:  pt performing simple tasks (dusting, making coffee,  microwaving, wiping counters)     OT SHORT TERM GOAL #8   Title Pt will cut food mod I using AE prn.    Time 8    Period Weeks    Status Achieved   met using rocker knife, info provided              OT Long Term Goals - 04/21/21 0809       OT LONG TERM GOAL #1   Title Pt will be independent with updated HEP--check LTGs 06/08/21    Time 12    Period Weeks    Status On-going      OT LONG TERM GOAL #2   Title Pt will perform BADLs mod I with AE/DME and strategies prn.    Time 12    Period Weeks    Status On-going      OT LONG TERM GOAL #3   Title Pt wil be for memory/cognitive compensation strategies for ADL/IADLs prn.    Time 12    Period Weeks    Status On-going   04/08/21- issued memory compensations     OT LONG TERM GOAL #4   Title Pt will perform simple cooking task with supervision.    Time 12    Period Weeks    Status On-going      OT LONG TERM GOAL #5   Title Pt will perform environmental scanning/navigation with at least 90% accuracy (with head turns) without reports of dizziness/LOB.    Time 12    Period Weeks    Status Achieved   14/15-93%     OT LONG TERM GOAL #6   Title Pt will improve RUE coordination as shown by completing 9-hole peg test in less than 36sec without drops.    Time 12    Period Weeks    Status On-going   04/15/21-41.52                  Plan - 04/21/21 0852     Clinical Impression Statement Pt is progressing towards goals.  Pt demo improving RUE control, , balance and functional use.    OT Occupational Profile and History Detailed Assessment- Review of Records and additional review of physical, cognitive, psychosocial history related to current functional  performance    Occupational performance deficits (Please refer to evaluation for details): ADL's;IADL's;Leisure;Social Participation    Body Structure / Function / Physical Skills ADL;Decreased knowledge of use of DME;Strength;GMC;Dexterity;Balance;UE functional use;IADL;Endurance;ROM;Vision;Improper spinal/pelvic alignment;Coordination;Mobility;FMC    Rehab Potential Good    OT Frequency 2x / week    OT Duration 12 weeks    OT Treatment/Interventions Self-care/ADL training;Moist Heat;DME and/or AE instruction;Balance training;Aquatic Therapy;Therapeutic activities;Cognitive remediation/compensation;Therapeutic exercise;Cryotherapy;Neuromuscular education;Functional Mobility Training;Passive range of motion;Visual/perceptual remediation/compensation;Patient/family education;Manual Therapy;Energy conservation;Paraffin;Fluidtherapy    Plan check goals, pt only has 2 more visits left, review HEP, functional reaching, ADL strategies.    Consulted and Agree with Plan of Care Patient             Patient will benefit from skilled therapeutic intervention in order to improve the following deficits and impairments:   Body Structure / Function / Physical Skills: ADL, Decreased knowledge of use of DME, Strength, GMC, Dexterity, Balance, UE functional use, IADL, Endurance, ROM, Vision, Improper spinal/pelvic alignment, Coordination, Mobility, [facility]  Visit Diagnosis: Other lack of coordination  Other symptoms and signs involving cognitive functions following cerebral infarction  Visuospatial deficit  Attention and concentration deficit  Ataxia  Muscle weakness (generalized)    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   Cerebellar stroke ([place]) 01/30/2021   Acute cerebrovascular accident (CVA) ([place]) 01/20/2021   Acute CVA (cerebrovascular accident) ([place]) 01/20/2021   GERD (gastroesophageal reflux disease)    Coronary  artery disease    Depression 01/06/2020   Pneumonia 01/05/2020   History of chest pain 09/26/2018   Dyspnea on exertion 09/08/2018   Chest pain, rule out acute myocardial infarction 10/30/2017   Edema 01/25/2017   Tobacco abuse 06/10/2016   Headache 09/15/2015   OSA (obstructive sleep apnea) 07/30/2015   Nephrolithiasis 07/30/2015   Vasovagal syncope 07/30/2015   Carotid artery stenosis, asymptomatic 07/30/2015   CAD (coronary artery disease) 08/22/2013   Essential hypertension 08/22/2013   Diabetes mellitus without complication ([place])    Dyslipidemia    Seizures ([place])    Generalized convulsive epilepsy ([place]) 03/22/2013   Memory deficit 03/22/2013    Demarlo Riojas 04/21/2021, 8:53 AM  [facility] [address] [place], [place], [address] Phone: [phone]   Fax:  [phone]  Name: Bryn Saline MRN: [id number] Date of Birth: [birth year]

## 2021-04-22 ENCOUNTER — Telehealth: Payer: Self-pay | Admitting: Neurology

## 2021-04-22 NOTE — Therapy (Signed)
[facility] [address] [place], [place], [address] Phone: [phone]   Fax:  [phone]  Physical Therapy Treatment  Patient Details  Name: Samantha Huffman MRN: [id number] Date of Birth: 29-May-1956 Referring Provider (PT): Lauraine Rinne, PA-C   Encounter Date: 04/21/2021   PT End of Session - 04/22/21 1031     Visit Number 14    Number of Visits 25    Date for PT Re-Evaluation 05/15/21    Authorization Type A[employer]na Medicare    Authorization Time Period 02-19-21 - 05-21-21    Progress Note Due on Visit 10    PT Start Time 0935    PT Stop Time 1017    PT Time Calculation (min) 42 min    Equipment Utilized During Treatment Gait belt    Activity Tolerance Patient tolerated treatment well    Behavior During Therapy [facility] for tasks assessed/performed             Past Medical History:  Diagnosis Date   Carotid artery stenosis    1-39% bilateral stenosis by dopplers 08/2020   Coronary artery disease    a. s/p CABG 10/2012.  cath 05/2016 with moderate LM stenosis, mild LCx and RCA stenosis and occluded LAD with patent LIMA to LAD, free radial to OM.  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   Dyslipidemia    Family history of adverse reaction to anesthesia    mother and sister ponv   Generalized convulsive epilepsy without mention of intractable epilepsy    GERD (gastroesophageal reflux disease)    History of kidney stones    Hypertension    Memory deficit    slight   Migraine    "controlled on daily RX " (06/11/2016)   Nephrolithiasis 07/30/2015   S/p lithotripsy x 3 and right and left ureter stents   OSA (obstructive sleep apnea)    cpap    PONV (postoperative nausea and vomiting)    Seizures ([place])    "controlled w/daily RX; started in 2012; dr thinks they might be from the migraines; last sz was early part of 2016" (06/11/2016)   Type II diabetes mellitus ([place])    metformin   Vasovagal syncope 07/30/2015    2011    Past Surgical History:  Procedure Laterality Date   CARDIAC CATHETERIZATION  2014; 06/11/2016   CARDIAC CATHETERIZATION N/A 06/11/2016   Procedure: Left Heart Cath and Cors/Grafts Angiography;  Surgeon: Sherren Mocha, MD;  Location: [facility];  Service: Cardiovascular;  Laterality: N/A;   CESAREAN SECTION     CORONARY ANGIOPLASTY     CORONARY ARTERY BYPASS GRAFT  March, 2014   LIMA to LAD, left radial to LCx x 2   CYSTOSCOPY W/ URETERAL STENT PLACEMENT Left 01/16/2019   Procedure: CYSTOSCOPY WITH RETROGRADE PYELOGRAM/URETERAL LEFT STENT PLACEMENT;  Surgeon: Ardis Hughs, MD;  [facility];  Service: Urology;  Laterality: Left;   CYSTOSCOPY/URETEROSCOPY/HOLMIUM LASER/STENT PLACEMENT Left 01/24/2019   Procedure: CYSTOSCOPY, LEFT URETEROSCOPY, HOLMIUM LASER, STONE REMOVAL AND STENT EXCHANGE;  Surgeon: Ardis Hughs, MD;  [facility];  Service: Urology;  Laterality: Left;   LITHOTRIPSY  X 3   RIGHT/LEFT HEART CATH AND CORONARY/GRAFT ANGIOGRAPHY N/A 09/08/2018   Procedure: RIGHT/LEFT HEART CATH AND CORONARY/GRAFT ANGIOGRAPHY;  Surgeon: Martinique, Peter M, MD;  Location: [facility];  Service: Cardiovascular;  Laterality: N/A;   TOTAL ABDOMINAL HYSTERECTOMY     ovaries took before hysterectomy   TUBAL LIGATION  URETERAL STENT PLACEMENT      There were no vitals filed for this visit.   Subjective Assessment - 04/21/21 0933     Subjective Pt reports she walked a lot over the weekend when she went furniture shopping with her daughter; states her Rt calf is tight and a little sore    Pertinent History Carotid artery stenosis, HTN, epilepsy with seizure disorder - last seizure was in early 2016:  migraines, DM, memory deficit, CAD,    Diagnostic tests CT of head and neck:  MRA    Patient Stated Goals Improve balance and walking    Currently in Pain? Yes    Pain Score 2     Pain Location Leg    Pain Orientation Right    Pain Descriptors / Indicators  Aching;Sore;Discomfort;Tightness    Pain Type Acute pain    Pain Onset In the past 7 days   since Saturday                              [facility] Adult PT Treatment/Exercise - 04/22/21 0001       Ambulation/Gait   Ambulation/Gait Yes    Ambulation/Gait Assistance 4: Min guard    Ambulation Distance (Feet) 400 Feet    Assistive device None    Gait Pattern Step-through pattern    Ambulation Surface Unlevel;Outdoor;Paved    Gait velocity 10.81 secs = 3.03 ft/sec with no device    Number of Stairs 4    Curb 5: Supervision   no device used; cues given for placement of foot during descension     Dynamic Gait Index   Level Surface Mild Impairment    Change in Gait Speed Normal    Gait with Horizontal Head Turns Mild Impairment    Gait with Vertical Head Turns Moderate Impairment    Gait and Pivot Turn Normal    Step Over Obstacle Normal    Step Around Obstacles Normal    Steps Mild Impairment    Total Score 19            TUG score 11.87 secs without device     Balance Exercises - 04/22/21 0001       Balance Exercises: Standing   Rockerboard Anterior/posterior;Head turns;Other reps (comment)   20 reps   Marching Foam/compliant surface;Static;10 reps   CGA   Other Standing Exercises pt performed alternate tap down step to floor from [employer] - 5 reps each leg with CGA to min assist for balance                 PT Short Term Goals - 04/21/21 0940       PT SHORT TERM GOAL #1   Title Improve Berg score from 34/56 to >/= 39/56 for reduced fall risk.    Baseline 03/18/21: 50/56 scored today    Status Achieved      PT SHORT TERM GOAL #2   Title Improve TUG score from 17.32 secs to </= 14.5 secs without use of device to increase safety with ambulation.    Baseline 03/18/21: 14.41 secs with RW, 15.18 sec's no AD, met with RW not no AD;  11.87 secs - 04-21-21    Status Achieved    Target Date 04/17/21      PT SHORT TERM GOAL #3   Title Increase gait  velocity from 2.25 ft/sec without device to >/= 2.6 ft/sec with CGA for incr. gait efficiency.  Baseline 27/27/22: 2.41 ft/sec with RW with supervision, 2.65 ft/sec no AD with min guard assist, met with no AD;   04-21-21 -- 10.81 secs = 3.03    Status Achieved      PT SHORT TERM GOAL #4   Title Assess DGI and establish goal as appropriate. Updated STG:  Increase DGI score from 13/24 to >/= 18/24 to reduce fall risk.    Baseline 03/20/21: 13/24 scored today as baseline, baseline added to LTG; 19/24 on 04-21-21    Status Achieved    Target Date 04/17/21      PT SHORT TERM GOAL #5   Title Pt wil amb. 350' with CGA on flat, even surface without LOB.; Updated STG;  Amb. 350' with supervision without use of RW, including amb. to all therapies without RW without LOB.    Baseline 03/18/21: met in session with RW; pt using RW to amb. to/from PT but states she is only using RW at home at night when she goes to the bathroom - 04-21-21    Status Partially Met    Target Date 04/17/21      PT SHORT TERM GOAL #6   Title Independent in HEP for balance and strengthening exercises.    Baseline 03/18/21: met with current HEP per pt report    Status Achieved    Target Date 03/20/21               PT Long Term Goals - 04/21/21 1005       PT LONG TERM GOAL #1   Title Improve Berg score from 34/56 to >/= 50/56 to reduce fall risk.. (all LTGs due 05/22/21)    Baseline 34/56 on 02-19-21    Time 12    Period Weeks    Status On-going      PT LONG TERM GOAL #2   Title Pt will ambulate 1000' modified independently without device on even and uneven surfaces.    Time 12    Period Weeks    Status On-going      PT LONG TERM GOAL #3   Title Improve TUG score from 17.32 secs to </= 13 secs to reduce fall risk.    Baseline 17.32 secs on 02-19-21 with CGA; 11.87 secs on 04-21-21    Time 12    Period Weeks    Status Achieved      PT LONG TERM GOAL #4   Title Improve DGI by at least 10 points to increase safety  with gait.    Baseline 03/20/21: 13/24    Time 12    Period Weeks    Status On-going      PT LONG TERM GOAL #5   Title Independent in updated HEP as appropriate.    Time 12    Period Weeks    Status On-going      PT LONG TERM GOAL #6   Title Increase LE FOTO score by at least 10 points to demo improvement in functional status.    Baseline 59/100 on 02-19-21    Time 12    Period Weeks    Status On-going                   Plan - 04/22/21 1037     Clinical Impression Statement Pt has met upgraded STG's #1-4 and 6; STG #5 is partially met as pt would be able to safely amb. to therapies without RW but has been instructed to use RW so that she has tray  to use to carry objects.  Pt is progressing well towards LTG's - pt requests D/C next week, approx. 2 weeks ahead of anticipated D/C/LOS.    Personal Factors and Comorbidities Comorbidity 2;Past/Current Experience;Transportation    Comorbidities migraines, epilepsy with seizure disorder, DM, CAD, s/p cataract sx on bil. eyes, memory deficit    Examination-Activity Limitations Bathing;Stand;Transfers;Carry;Squat;Stairs;Locomotion Level;Hygiene/Grooming;Bend    Examination-Participation Restrictions Cleaning;Community Activity;Driving;Laundry;Shop;Meal Prep    Stability/Clinical Decision Making Evolving/Moderate complexity    Rehab Potential Good    PT Frequency 2x / week    PT Duration 12 weeks    PT Treatment/Interventions ADLs/Self Care Home Management;Aquatic Therapy;DME Instruction;Gait training;Stair training;Therapeutic activities;Therapeutic exercise;Balance training;Neuromuscular re-education;Patient/family education    PT Next Visit Plan Cont gait training without RW, balance and coordination exercises; RLE strengthening - pt requests D/C week of 04-27-21    PT Home Exercise Plan balance HEP issued on 03-12-21             Patient will benefit from skilled therapeutic intervention in order to improve the following  deficits and impairments:  Abnormal gait, Decreased activity tolerance, Decreased endurance, Decreased balance, Decreased coordination, Decreased strength, Impaired UE functional use  Visit Diagnosis: Other lack of coordination  Other abnormalities of gait and mobility  Unsteadiness on feet     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/2021   Cerebellar stroke ([place]) 01/30/2021   Acute cerebrovascular accident (CVA) ([place]) 01/20/2021   Acute CVA (cerebrovascular accident) ([place]) 01/20/2021   GERD (gastroesophageal reflux disease)    Coronary artery disease    Depression 01/06/2020   Pneumonia 01/05/2020   History of chest pain 09/26/2018   Dyspnea on exertion 09/08/2018   Chest pain, rule out acute myocardial infarction 10/30/2017   Edema 01/25/2017   Tobacco abuse 06/10/2016   Headache 09/15/2015   OSA (obstructive sleep apnea) 07/30/2015   Nephrolithiasis 07/30/2015   Vasovagal syncope 07/30/2015   Carotid artery stenosis, asymptomatic 07/30/2015   CAD (coronary artery disease) 08/22/2013   Essential hypertension 08/22/2013   Diabetes mellitus without complication ([place])    Dyslipidemia    Seizures ([place])    Generalized convulsive epilepsy ([place]) 03/22/2013   Memory deficit 03/22/2013    Alda Lea, PT 04/22/2021, 10:43 AM  [facility] [address] [place], [place], [address] Phone: [phone]   Fax:  [phone]  Name: Samantha Huffman MRN: [id number] Date of Birth: [birth year]

## 2021-04-22 NOTE — Telephone Encounter (Signed)
I called the patient.  The cardiac monitor study shows no evidence of atrial fibrillation, no indication for anticoagulation at this point.   Cardiac monitor 04/22/21:  Narrative & Impression Predominant rhythm is normal sinus rhythm with average heart rate 73bpm and ranges from 52 to 134bpm. Occasional PVCs

## 2021-04-28 ENCOUNTER — Ambulatory Visit: Payer: Medicare HMO

## 2021-04-28 ENCOUNTER — Ambulatory Visit: Payer: Medicare HMO | Admitting: Occupational Therapy

## 2021-04-28 ENCOUNTER — Encounter: Payer: Self-pay | Admitting: Occupational Therapy

## 2021-04-28 ENCOUNTER — Ambulatory Visit: Payer: Medicare HMO | Attending: Family Medicine | Admitting: Physical Therapy

## 2021-04-28 ENCOUNTER — Other Ambulatory Visit: Payer: Self-pay

## 2021-04-28 DIAGNOSIS — R2681 Unsteadiness on feet: Secondary | ICD-10-CM | POA: Insufficient documentation

## 2021-04-28 DIAGNOSIS — R41840 Attention and concentration deficit: Secondary | ICD-10-CM | POA: Insufficient documentation

## 2021-04-28 DIAGNOSIS — R278 Other lack of coordination: Secondary | ICD-10-CM | POA: Insufficient documentation

## 2021-04-28 DIAGNOSIS — R270 Ataxia, unspecified: Secondary | ICD-10-CM

## 2021-04-28 DIAGNOSIS — I69318 Other symptoms and signs involving cognitive functions following cerebral infarction: Secondary | ICD-10-CM | POA: Insufficient documentation

## 2021-04-28 DIAGNOSIS — M6281 Muscle weakness (generalized): Secondary | ICD-10-CM | POA: Diagnosis present

## 2021-04-28 DIAGNOSIS — R41842 Visuospatial deficit: Secondary | ICD-10-CM | POA: Insufficient documentation

## 2021-04-28 DIAGNOSIS — R41841 Cognitive communication deficit: Secondary | ICD-10-CM | POA: Diagnosis present

## 2021-04-28 DIAGNOSIS — R4701 Aphasia: Secondary | ICD-10-CM | POA: Diagnosis present

## 2021-04-28 DIAGNOSIS — R471 Dysarthria and anarthria: Secondary | ICD-10-CM | POA: Insufficient documentation

## 2021-04-28 DIAGNOSIS — R2689 Other abnormalities of gait and mobility: Secondary | ICD-10-CM | POA: Insufficient documentation

## 2021-04-28 DIAGNOSIS — R482 Apraxia: Secondary | ICD-10-CM

## 2021-04-28 NOTE — Therapy (Signed)
[facility] [address] [place], [place], [address] Phone: [phone]   Fax:  [phone]  Speech Language Pathology Treatment  Patient Details  Name: Samantha Huffman MRN: [id number] Date of Birth: [birth year] Referring Provider (SLP): Lauraine Rinne, [place]   Encounter Date: 04/28/2021   End of Session - 04/28/21 1324     Visit Number 15    Number of Visits 25    Date for SLP Re-Evaluation 05/20/21    SLP Start Time 0933    SLP Stop Time  3559    SLP Time Calculation (min) 42 min    Activity Tolerance Patient tolerated treatment well             Past Medical History:  Diagnosis Date   Carotid artery stenosis    1-39% bilateral stenosis by dopplers 08/2020   Coronary artery disease    a. s/p CABG 10/2012.  cath 05/2016 with moderate LM stenosis, mild LCx and RCA stenosis and occluded LAD with patent LIMA to LAD, free radial to OM.  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   Dyslipidemia    Family history of adverse reaction to anesthesia    mother and sister ponv   Generalized convulsive epilepsy without mention of intractable epilepsy    GERD (gastroesophageal reflux disease)    History of kidney stones    Hypertension    Memory deficit    slight   Migraine    "controlled on daily RX " (06/11/2016)   Nephrolithiasis 07/30/2015   S/p lithotripsy x 3 and right and left ureter stents   OSA (obstructive sleep apnea)    cpap    PONV (postoperative nausea and vomiting)    Seizures ([place])    "controlled w/daily RX; started in 2012; dr thinks they might be from the migraines; last sz was early part of 2016" (06/11/2016)   Type II diabetes mellitus ([place])    metformin   Vasovagal syncope 07/30/2015   2011    Past Surgical History:  Procedure Laterality Date   CARDIAC CATHETERIZATION  2014; 06/11/2016   CARDIAC CATHETERIZATION N/A 06/11/2016   Procedure: Left Heart Cath and Cors/Grafts Angiography;  Surgeon:  Sherren Mocha, MD;  Location: [facility];  Service: Cardiovascular;  Laterality: N/A;   CESAREAN SECTION     CORONARY ANGIOPLASTY     CORONARY ARTERY BYPASS GRAFT  March, 2014   LIMA to LAD, left radial to LCx x 2   CYSTOSCOPY W/ URETERAL STENT PLACEMENT Left 01/16/2019   Procedure: CYSTOSCOPY WITH RETROGRADE PYELOGRAM/URETERAL LEFT STENT PLACEMENT;  Surgeon: Ardis Hughs, MD;  Location: [facility];  Service: Urology;  Laterality: Left;   CYSTOSCOPY/URETEROSCOPY/HOLMIUM LASER/STENT PLACEMENT Left 01/24/2019   Procedure: CYSTOSCOPY, LEFT URETEROSCOPY, HOLMIUM LASER, STONE REMOVAL AND STENT EXCHANGE;  Surgeon: Ardis Hughs, MD;  Location: [facility];  Service: Urology;  Laterality: Left;   LITHOTRIPSY  X 3   RIGHT/LEFT HEART CATH AND CORONARY/GRAFT ANGIOGRAPHY N/A 09/08/2018   Procedure: RIGHT/LEFT HEART CATH AND CORONARY/GRAFT ANGIOGRAPHY;  Surgeon: Martinique, Peter M, MD;  Location: [facility];  Service: Cardiovascular;  Laterality: N/A;   TOTAL ABDOMINAL HYSTERECTOMY     ovaries took before hysterectomy   TUBAL LIGATION     URETERAL STENT PLACEMENT      There were no vitals filed for this visit.   Subjective Assessment - 04/28/21 0939     Subjective "A litle bit of both." (pt taking her meds, and daughter asking pt if  she took them)    Currently in Pain? No/denies                   ADULT SLP TREATMENT - 04/28/21 0940       General Information   Behavior/Cognition Alert;Pleasant mood;Cooperative;Distractible;Impulsive      Treatment Provided   Treatment provided Cognitive-Linquistic      Cognitive-Linquistic Treatment   Treatment focused on Dysarthria;Cognition    Skilled Treatment Pt reiterated she is excited about d/c on Friday and that she does not desire more ST visits. SLP targeted pt's intelligibilty today, in connected speech. Pt told sequences verbally to SLP with rare min cues to overarticulate. SLP req'd to ask pt to repeat x2 during this  session. Pt's voice changed during the session to a higher pitch x3 but decr'd again to WNL pitch 5-8 seconds after the incr'd pitch began. SLP believes this may just be pt's personality/habit than a deficit due to CVA.      Assessment / Recommendations / Plan   Plan Continue with current plan of care   d/c next session     Progression Toward Goals   Progression toward goals Progressing toward goals                SLP Short Term Goals - 04/13/21 1013       SLP SHORT TERM GOAL #1   Title pt will engage in cognitive linguistic and speech strength/coordination testing and goals added as necessary    Status Achieved    Target Date 03/05/21      SLP SHORT TERM GOAL #2   Title using compensations, Samantha Huffman will remind family when she needs to take meds (pt is now dependent upon family) 80% of opportunities between 3 sessions    Status Not Met    Target Date 03/20/21      SLP SHORT TERM GOAL #3   Title pt will name 10 items in a simple category with rare verbal cues x3 sessions    Baseline 03-23-21    Status Not Met    Target Date 03/20/21      SLP SHORT TERM GOAL #4   Title pt will independently use anomia compensations in 5 minutes simple conversation x3 sessions    Baseline 03-12-21, 03-23-21    Status Not Met    Target Date 03/20/21      SLP SHORT TERM GOAL #5   Title pt will exhibit 100% intelligibility in 5 minutes simple conversation using speech compensations, x3 sessions    Status Partially Met    Target Date 03/20/21      SLP SHORT TERM GOAL #6   Title Samantha Huffman will complete a speech HEP with rare min A x2 sessions    Status Not Met    Target Date 03/20/21      SLP SHORT TERM GOAL #7   Title Pt will verbalize and demonstrate 3 attention/memory strategies to aid functioning home with min A over 2 sessions    Status Not Met    Target Date 03/20/21              SLP Long Term Goals - 04/28/21 1328       SLP LONG TERM GOAL #1   Title pt will complete speech HEP  with occasional min A x3 sessions    Status Partially Met      SLP LONG TERM GOAL #2   Title in 7 minutes simple conversation pt will exhibit functional fluid speech  using compensations in 2 sessions    Status Partially Met      SLP LONG TERM GOAL #3   Title in 8 minutes mod complex conversation pt will exhibit functional fluid speech using compensations in 2 sessions    Time 9    Period Weeks    Status On-going      SLP LONG TERM GOAL #4   Title pt will complete VNeST and SFA tasks with occasoinal min A in 3 sessions    Time 5    Period Weeks    Status Deferred   pt denies anomia/dysnomia     SLP LONG TERM GOAL #5   Title Samantha Huffman will exhibit functional word-finding using compensations in 8 mintues mod complex conversation in 3 sessions    Baseline 03-30-21, 04-06-21    Status Achieved      SLP LONG TERM GOAL #6   Title Samantha Huffman will independently take meds for 10 days as reported by Samantha Huffman or family    Status Achieved   per pt report     SLP LONG TERM GOAL #7   Title Pt will verbalize and demonstrate 3 attention/memory strategies to aid functioning at work and home with rare min A over 2 sessions    Time 9    Period Weeks    Status On-going              Plan - 04/28/21 1325     Clinical Impression Statement 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 At this time, she is intelligible, and is still somewhat dependent on family and friends, for example - with pt's medication administration as pt told SLP today. Pt is satisfied with her current functional level and is excited about d/c next session.    Speech Therapy Frequency 2x / week    Duration 12 weeks    Treatment/Interventions Language facilitation;Cueing hierarchy;SLP instruction and feedback;Cognitive reorganization;Compensatory strategies;Internal/external aids;Patient/family education;Functional tasks;Environmental controls;Oral motor exercises    Potential to Achieve Goals Good              Patient will benefit from skilled therapeutic intervention in order to improve the following deficits and impairments:   Dysarthria and anarthria  Verbal apraxia  Cognitive communication deficit    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[facility] [facility]) 03/09/2021   Cerebellar stroke ([facility]) 01/30/2021   Acute cerebrovascular accident (CVA) ([facility]) 01/20/2021   Acute CVA (cerebrovascular accident) ([facility]) 01/20/2021   GERD (gastroesophageal reflux disease)    Coronary artery disease    Depression 01/06/2020   Pneumonia 01/05/2020   History of chest pain 09/26/2018   Dyspnea on exertion 09/08/2018   Chest pain, rule out acute myocardial infarction 10/30/2017   Edema 01/25/2017   Tobacco abuse 06/10/2016   Headache 09/15/2015   OSA (obstructive sleep apnea) 07/30/2015   Nephrolithiasis 07/30/2015   Vasovagal syncope 07/30/2015   Carotid artery stenosis, asymptomatic 07/30/2015   CAD (coronary artery disease) 08/22/2013   Essential hypertension 08/22/2013   Diabetes mellitus without complication ([place])    Dyslipidemia    Seizures ([place])    Generalized convulsive epilepsy ([place]) 03/22/2013   Memory deficit 03/22/2013    [facility] ,[place], [place] [address] [place], [place], [address] Phone: [phone]   Fax:  [phone]   Name: Samantha Huffman MRN: [id number] Date of Birth: [birth year]

## 2021-04-28 NOTE — Therapy (Signed)
[facility] [address] [place], [place], [address] Phone: [phone]   Fax:  [phone]  Occupational Therapy Treatment  Patient Details  Name: Samantha Huffman MRN: [id number] Date of Birth: [birth year] Referring Provider (OT): Danella Sensing, NP   Encounter Date: 04/28/2021   OT End of Session - 04/28/21 0832     Visit Number 15    Number of Visits 25    Date for OT Re-Evaluation 06/08/21    Authorization Type [employer] (recert completed 11/05/38 due to delay in start of OT due to schedule conflicts)    Authorization - Visit Number 15    Authorization - Number of Visits 20    Progress Note Due on Visit 63    OT Start Time 0804    OT Stop Time 0844    OT Time Calculation (min) 40 min    Activity Tolerance Patient tolerated treatment well    Behavior During Therapy [facility] for tasks assessed/performed             Past Medical History:  Diagnosis Date   Carotid artery stenosis    1-39% bilateral stenosis by dopplers 08/2020   Coronary artery disease    a. s/p CABG 10/2012.  cath 05/2016 with moderate LM stenosis, mild LCx and RCA stenosis and occluded LAD with patent LIMA to LAD, free radial to OM.  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   Dyslipidemia    Family history of adverse reaction to anesthesia    mother and sister ponv   Generalized convulsive epilepsy without mention of intractable epilepsy    GERD (gastroesophageal reflux disease)    History of kidney stones    Hypertension    Memory deficit    slight   Migraine    "controlled on daily RX " (06/11/2016)   Nephrolithiasis 07/30/2015   S/p lithotripsy x 3 and right and left ureter stents   OSA (obstructive sleep apnea)    cpap    PONV (postoperative nausea and vomiting)    Seizures ([place])    "controlled w/daily RX; started in 2012; dr thinks they might be from the migraines; last sz was early part of 2016" (06/11/2016)   Type II diabetes  mellitus ([place])    metformin   Vasovagal syncope 07/30/2015   2011    Past Surgical History:  Procedure Laterality Date   CARDIAC CATHETERIZATION  2014; 06/11/2016   CARDIAC CATHETERIZATION N/A 06/11/2016   Procedure: Left Heart Cath and Cors/Grafts Angiography;  Surgeon: Sherren Mocha, MD;  Location: [facility];  Service: Cardiovascular;  Laterality: N/A;   CESAREAN SECTION     CORONARY ANGIOPLASTY     CORONARY ARTERY BYPASS GRAFT  March, 2014   LIMA to LAD, left radial to LCx x 2   CYSTOSCOPY W/ URETERAL STENT PLACEMENT Left 01/16/2019   Procedure: CYSTOSCOPY WITH RETROGRADE PYELOGRAM/URETERAL LEFT STENT PLACEMENT;  Surgeon: Ardis Hughs, MD;  [facility]: [facility];  Service: Urology;  Laterality: Left;   CYSTOSCOPY/URETEROSCOPY/HOLMIUM LASER/STENT PLACEMENT Left 01/24/2019   Procedure: CYSTOSCOPY, LEFT URETEROSCOPY, HOLMIUM LASER, STONE REMOVAL AND STENT EXCHANGE;  Surgeon: Ardis Hughs, MD;  Location: [facility];  Service: Urology;  Laterality: Left;   LITHOTRIPSY  X 3   RIGHT/LEFT HEART CATH AND CORONARY/GRAFT ANGIOGRAPHY N/A 09/08/2018   Procedure: RIGHT/LEFT HEART CATH AND CORONARY/GRAFT ANGIOGRAPHY;  Surgeon: Martinique, Peter M, MD;  Location: [facility];  Service: Cardiovascular;  Laterality: N/A;   TOTAL ABDOMINAL HYSTERECTOMY  ovaries took before hysterectomy   TUBAL LIGATION     URETERAL STENT PLACEMENT      There were no vitals filed for this visit.   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  PMH:  CAD, HTN, DM, OSA, seizure disorder, dyslipidemia, migraine, CABG (2014), recent bilateral cataract surgeries just prior to CVA    Limitations fall risk, ataxia, visual deficits    Patient Stated Goals return to driving, return to living alone, cook    Currently in Pain? No/denies                 Treatment: Prone on elbows lifting  chest, then reaching with alternate arms for core stability, and strength min v.c Seated closed chain shoulder flexion and diagonals with emphasis on control, min v.c Standing to copy small peg design with RUE, mod difficulty/ drops, min v.c , increased time required. Discussed plans for d/c next visit..                   OT Short Term Goals - 04/15/21 2297       OT SHORT TERM GOAL #1   Title Pt will be independent with initial HEP.--Check initial STGs (#1-4)  04/09/21    Time 4    Period Weeks    Status Achieved      OT SHORT TERM GOAL #2   Title Pt will demo improved coordination and functional reach for ADLs as shown by improving score on box and blocks test by at least 6.    Baseline 42 blocks    Time 4    Period Weeks    Status On-going   04/15/21 39 blocks     OT SHORT TERM GOAL #3   Title Pt will perform bathing with min A.    Time 4    Period Weeks    Status Achieved   03/30/21:  min A for washing, needs assist for transfer due to prefer bath     OT SHORT TERM GOAL #4   Title Pt will perofrm UB dressing mod I.    Time 4    Period Weeks    Status Achieved      OT SHORT TERM GOAL #5   Title Pt will perform LB dressing with min A.--check STGs #5-8  05/10/21    Time 8    Period Weeks    Status Achieved      OT SHORT TERM GOAL #6   Title Pt will perform bathing with supervision except transfer (pt prefers bath and will need A due to safety, has been provided with info for tub transfer bench).    Time 8    Period Weeks    Status Deferred   04/06/21:  min A due to pt preference     OT SHORT TERM GOAL #7   Title Pt will peform simple home maintenane task and snack prep mod I.    Time 8    Period Weeks    Status Achieved   04/06/21:  pt performing simple tasks (dusting, making coffee,  microwaving, wiping counters)     OT SHORT TERM GOAL #8   Title Pt will cut food mod I using AE prn.    Time 8    Period Weeks    Status Achieved   met using rocker knife,  info provided  OT Long Term Goals - 04/21/21 0809       OT LONG TERM GOAL #1   Title Pt will be independent with updated HEP--check LTGs 06/08/21    Time 12    Period Weeks    Status On-going      OT LONG TERM GOAL #2   Title Pt will perform BADLs mod I with AE/DME and strategies prn.    Time 12    Period Weeks    Status On-going      OT LONG TERM GOAL #3   Title Pt wil be for memory/cognitive compensation strategies for ADL/IADLs prn.    Time 12    Period Weeks    Status On-going   04/08/21- issued memory compensations     OT LONG TERM GOAL #4   Title Pt will perform simple cooking task with supervision.    Time 12    Period Weeks    Status On-going      OT LONG TERM GOAL #5   Title Pt will perform environmental scanning/navigation with at least 90% accuracy (with head turns) without reports of dizziness/LOB.    Time 12    Period Weeks    Status Achieved   14/15-93%     OT LONG TERM GOAL #6   Title Pt will improve RUE coordination as shown by completing 9-hole peg test in less than 36sec without drops.    Time 12    Period Weeks    Status On-going   04/15/21-41.90                  Plan - 04/28/21 0833     Clinical Impression Statement Pt is progressing towards goals.  Pt reports that she was able to do alot of cooking this weekend.,    OT Occupational Profile and History Detailed Assessment- Review of Records and additional review of physical, cognitive, psychosocial history related to current functional performance    Occupational performance deficits (Please refer to evaluation for details): ADL's;IADL's;Leisure;Social Participation    Body Structure / Function / Physical Skills ADL;Decreased knowledge of use of DME;Strength;GMC;Dexterity;Balance;UE functional use;IADL;Endurance;ROM;Vision;Improper spinal/pelvic alignment;Coordination;Mobility;FMC    Rehab Potential Good    OT Frequency 2x / week    OT Duration 12 weeks    OT  Treatment/Interventions Self-care/ADL training;Moist Heat;DME and/or AE instruction;Balance training;Aquatic Therapy;Therapeutic activities;Cognitive remediation/compensation;Therapeutic exercise;Cryotherapy;Neuromuscular education;Functional Mobility Training;Passive range of motion;Visual/perceptual remediation/compensation;Patient/family education;Manual Therapy;Energy conservation;Paraffin;Fluidtherapy    Plan check goals,d/c next visit,  review HEP, functional reaching, ADL strategies.    Consulted and Agree with Plan of Care Patient             Patient will benefit from skilled therapeutic intervention in order to improve the following deficits and impairments:   Body Structure / Function / Physical Skills: ADL, Decreased knowledge of use of DME, Strength, GMC, Dexterity, Balance, UE functional use, IADL, Endurance, ROM, Vision, Improper spinal/pelvic alignment, Coordination, Mobility, FMC       Visit Diagnosis: Other lack of coordination  Other symptoms and signs involving cognitive functions following cerebral infarction  Visuospatial deficit  Attention and concentration deficit  Ataxia  Muscle weakness (generalized)    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[facility]) 03/09/2021   Cerebellar stroke ([place]) 01/30/2021   Acute cerebrovascular accident (CVA) ([place]) 01/20/2021   Acute CVA (cerebrovascular accident) ([place]) 01/20/2021   GERD (gastroesophageal reflux disease)    Coronary artery disease    Depression 01/06/2020  Pneumonia 01/05/2020   History of chest pain 09/26/2018   Dyspnea on exertion 09/08/2018   Chest pain, rule out acute myocardial infarction 10/30/2017   Edema 01/25/2017   Tobacco abuse 06/10/2016   Headache 09/15/2015   OSA (obstructive sleep apnea) 07/30/2015   Nephrolithiasis 07/30/2015   Vasovagal syncope 07/30/2015   Carotid artery stenosis, asymptomatic 07/30/2015   CAD (coronary artery  disease) 08/22/2013   Essential hypertension 08/22/2013   Diabetes mellitus without complication ([place])    Dyslipidemia    Seizures ([place])    Generalized convulsive epilepsy ([place]) 03/22/2013   Memory deficit 03/22/2013    Carless Slatten 04/28/2021, 8:37 AM  [facility] [address] [place], [place], [address] Phone: [phone]   Fax:  [phone]  Name: Ziya Coonrod MRN: [id number] Date of Birth: 1955/11/22

## 2021-04-28 NOTE — Therapy (Signed)
[facility] [address] [place], [place], [address] Phone: [phone]   Fax:  [phone]  Physical Therapy Treatment  Patient Details  Name: Samantha Huffman MRN: [id number] Date of Birth: 05/05/1956 Referring Provider (PT): Lauraine Rinne, PA-C   Encounter Date: 04/28/2021   PT End of Session - 04/28/21 1941     Visit Number 15    Number of Visits 25    Date for PT Re-Evaluation 05/15/21    Authorization Type [employer]    Authorization Time Period 02-19-21 - 05-21-21    Progress Note Due on Visit 10    PT Start Time 0847    PT Stop Time 0930    PT Time Calculation (min) 43 min    Equipment Utilized During Treatment Gait belt    Activity Tolerance Patient tolerated treatment well    Behavior During Therapy [facility] for tasks assessed/performed             Past Medical History:  Diagnosis Date   Carotid artery stenosis    1-39% bilateral stenosis by dopplers 08/2020   Coronary artery disease    a. s/p CABG 10/2012.  cath 05/2016 with moderate LM stenosis, mild LCx and RCA stenosis and occluded LAD with patent LIMA to LAD, free radial to OM.  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   Dyslipidemia    Family history of adverse reaction to anesthesia    mother and sister ponv   Generalized convulsive epilepsy without mention of intractable epilepsy    GERD (gastroesophageal reflux disease)    History of kidney stones    Hypertension    Memory deficit    slight   Migraine    "controlled on daily RX " (06/11/2016)   Nephrolithiasis 07/30/2015   S/p lithotripsy x 3 and right and left ureter stents   OSA (obstructive sleep apnea)    cpap    PONV (postoperative nausea and vomiting)    Seizures ([place])    "controlled w/daily RX; started in 2012; dr thinks they might be from the migraines; last sz was early part of 2016" (06/11/2016)   Type II diabetes mellitus ([place])    metformin   Vasovagal syncope 07/30/2015    2011    Past Surgical History:  Procedure Laterality Date   CARDIAC CATHETERIZATION  2014; 06/11/2016   CARDIAC CATHETERIZATION N/A 06/11/2016   Procedure: Left Heart Cath and Cors/Grafts Angiography;  Surgeon: Sherren Mocha, MD;  Location: [place] [facility];  Service: Cardiovascular;  Laterality: N/A;   CESAREAN SECTION     CORONARY ANGIOPLASTY     CORONARY ARTERY BYPASS GRAFT  March, 2014   LIMA to LAD, left radial to LCx x 2   CYSTOSCOPY W/ URETERAL STENT PLACEMENT Left 01/16/2019   Procedure: CYSTOSCOPY WITH RETROGRADE PYELOGRAM/URETERAL LEFT STENT PLACEMENT;  Surgeon: Ardis Hughs, MD;  Location: [facility];  Service: Urology;  Laterality: Left;   CYSTOSCOPY/URETEROSCOPY/HOLMIUM LASER/STENT PLACEMENT Left 01/24/2019   Procedure: CYSTOSCOPY, LEFT URETEROSCOPY, HOLMIUM LASER, STONE REMOVAL AND STENT EXCHANGE;  Surgeon: Ardis Hughs, MD;  [facility];  Service: Urology;  Laterality: Left;   LITHOTRIPSY  X 3   RIGHT/LEFT HEART CATH AND CORONARY/GRAFT ANGIOGRAPHY N/A 09/08/2018   Procedure: RIGHT/LEFT HEART CATH AND CORONARY/GRAFT ANGIOGRAPHY;  Surgeon: Martinique, Peter M, MD;  Location: [facility];  Service: Cardiovascular;  Laterality: N/A;   TOTAL ABDOMINAL HYSTERECTOMY     ovaries took before hysterectomy   TUBAL LIGATION  URETERAL STENT PLACEMENT      There were no vitals filed for this visit.   Subjective Assessment - 04/28/21 0854     Subjective Pt reports she had a good weekend - did a lot of cooking; is ready for discharge this week - wants to keep appt on Friday for PT    Pertinent History Carotid artery stenosis, HTN, epilepsy with seizure disorder - last seizure was in early 2016:  migraines, DM, memory deficit, CAD,    Diagnostic tests CT of head and neck:  MRA    Patient Stated Goals Improve balance and walking    Currently in Pain? No/denies    Pain Onset In the past 7 days   since Saturday               [facility] PT Assessment - 04/28/21  0924       Berg Balance Test   Sit to Stand Able to stand without using hands and stabilize independently    Standing Unsupported Able to stand safely 2 minutes    Sitting with Back Unsupported but Feet Supported on Floor or Stool Able to sit safely and securely 2 minutes    Stand to Sit Sits safely with minimal use of hands    Transfers Able to transfer safely, minor use of hands    Standing Unsupported with Eyes Closed Able to stand 10 seconds safely    Standing Unsupported with Feet Together Able to place feet together independently and stand 1 minute safely   right toes with slight toe out, heels together   From Standing, Reach Forward with Outstretched Arm Can reach confidently >25 cm (10")    From Standing Position, Pick up Object from Floor Able to pick up shoe safely and easily    From Standing Position, Turn to Look Behind Over each Shoulder Looks behind from both sides and weight shifts well   right>left   Turn 360 Degrees Able to turn 360 degrees safely in 4 seconds or less   right  3.81 sec's, left 3.16 sec's   Standing Unsupported, Alternately Place Feet on Step/Stool Able to stand independently and safely and complete 8 steps in 20 seconds   13.04 sec's with supervision for safety only, no LOB noted   Standing Unsupported, One Foot in Front Able to place foot tandem independently and hold 30 seconds    Standing on One Leg Able to lift leg independently and hold 5-10 seconds   bil LE's for ~2-3 sec's   Total Score 55                           [facility] Adult PT Treatment/Exercise - 04/28/21 0924       Ambulation/Gait   Ambulation/Gait Yes    Ambulation/Gait Assistance 5: Supervision    Ambulation Distance (Feet) 100 Feet    Assistive device None    Gait Pattern Step-through pattern    Ambulation Surface Level;Indoor    Gait velocity 9.60 secs = 3.42 ft/sec      Standardized Balance Assessment   Standardized Balance Assessment Timed Up and Go Test       Dynamic Gait Index   Level Surface Mild Impairment    Change in Gait Speed Normal    Gait with Horizontal Head Turns Mild Impairment    Gait with Vertical Head Turns Moderate Impairment    Gait and Pivot Turn Normal    Step Over Obstacle Normal  Step Around Obstacles Normal    Steps Mild Impairment    Total Score 19      Timed Up and Go Test   TUG Normal TUG           TUG score 10.47 secs with no device   D/C FOTO score 63/100 ( 4 point increase from initial 59/100)   NeuroRe-ed; pt performed stepping over and back of orange hurdle 10 reps with RLE without UE support with CGA  Tap ups to 1st and 2nd steps with RLE for improved foot clearance and coordination; performed tap ups to 2nd step with LLE For improved RLE SLS 10 reps       PT Short Term Goals - 04/28/21 1942       PT SHORT TERM GOAL #1   Title Improve Berg score from 34/56 to >/= 39/56 for reduced fall risk.    Baseline 03/18/21: 50/56 scored today    Status Achieved      PT SHORT TERM GOAL #2   Title Improve TUG score from 17.32 secs to </= 14.5 secs without use of device to increase safety with ambulation.    Baseline 03/18/21: 14.41 secs with RW, 15.18 sec's no AD, met with RW not no AD;  11.87 secs - 04-21-21    Status Achieved    Target Date 04/17/21      PT SHORT TERM GOAL #3   Title Increase gait velocity from 2.25 ft/sec without device to >/= 2.6 ft/sec with CGA for incr. gait efficiency.    Baseline 27/27/22: 2.41 ft/sec with RW with supervision, 2.65 ft/sec no AD with min guard assist, met with no AD;   04-21-21 -- 10.81 secs = 3.03    Status Achieved      PT SHORT TERM GOAL #4   Title Assess DGI and establish goal as appropriate. Updated STG:  Increase DGI score from 13/24 to >/= 18/24 to reduce fall risk.    Baseline 03/20/21: 13/24 scored today as baseline, baseline added to LTG; 19/24 on 04-21-21    Status Achieved    Target Date 04/17/21      PT SHORT TERM GOAL #5   Title Pt wil amb. 350'  with CGA on flat, even surface without LOB.; Updated STG;  Amb. 350' with supervision without use of RW, including amb. to all therapies without RW without LOB.    Baseline 03/18/21: met in session with RW; pt using RW to amb. to/from PT but states she is only using RW at home at night when she goes to the bathroom - 04-21-21    Status Partially Met    Target Date 04/17/21      PT SHORT TERM GOAL #6   Title Independent in HEP for balance and strengthening exercises.    Baseline 03/18/21: met with current HEP per pt report    Status Achieved    Target Date 03/20/21               PT Long Term Goals - 04/28/21 0903       PT LONG TERM GOAL #1   Title Improve Berg score from 34/56 to >/= 50/56 to reduce fall risk.. (all LTGs due 05/22/21)    Baseline 34/56 on 02-19-21; 55/56 on 04-28-21    Time 12    Period Weeks    Status Achieved      PT LONG TERM GOAL #2   Title Pt will ambulate 1000' modified independently without device on even and uneven  surfaces.    Time 12    Period Weeks    Status On-going      PT LONG TERM GOAL #3   Title Improve TUG score from 17.32 secs to </= 13 secs to reduce fall risk.    Baseline 17.32 secs on 02-19-21 with CGA; 11.87 secs on 04-21-21; 10.47 secs on 04-28-21    Time 12    Period Weeks    Status Achieved      PT LONG TERM GOAL #4   Title Improve DGI by at least 10 points to increase safety with gait.    Baseline 03/20/21: 13/24   04-28-21 19/24    Time 12    Period Weeks    Status Partially Met      PT LONG TERM GOAL #5   Title Independent in updated HEP as appropriate.    Baseline met 04-28-21    Time 12    Period Weeks    Status Achieved      PT LONG TERM GOAL #6   Title Increase LE FOTO score by at least 10 points to demo improvement in functional status.    Baseline 59/100 on 02-19-21; D/C FOTO score 62.8/100 on 04-28-21    Time 12    Period Weeks    Status Not Met                   Plan - 04/28/21 1945     Clinical Impression  Statement Pt has met LTG's #1, 3 and 5"  LTG #2 to be assessed next session:  LTG #4 partially met as DGI score has increased by 6 points from 13/24 to 19/24, but not by 10 points per stated LTG.  LTG #6 not met as FOTO increased by 4 points, not 10 point increase per stated LTG.  Pt is requesting D/C 2 weeks earlier than anticipated LOS due to being pleased with her current functional status.  D/C next session.    Personal Factors and Comorbidities Comorbidity 2;Past/Current Experience;Transportation    Comorbidities migraines, epilepsy with seizure disorder, DM, CAD, s/p cataract sx on bil. eyes, memory deficit    Examination-Activity Limitations Bathing;Stand;Transfers;Carry;Squat;Stairs;Locomotion Level;Hygiene/Grooming;Bend    Examination-Participation Restrictions Cleaning;Community Activity;Driving;Laundry;Shop;Meal Prep    Stability/Clinical Decision Making Evolving/Moderate complexity    Rehab Potential Good    PT Frequency 2x / week    PT Duration 12 weeks    PT Treatment/Interventions ADLs/Self Care Home Management;Aquatic Therapy;DME Instruction;Gait training;Stair training;Therapeutic activities;Therapeutic exercise;Balance training;Neuromuscular re-education;Patient/family education    PT Next Visit Plan Check LTG #2;  cont with dynamic gait and balance training - no device; I will do D/C summary - thanks!    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[place])  03/09/2021   Cerebellar stroke ([place]) 01/30/2021   Acute cerebrovascular accident (CVA) ([place]) 01/20/2021   Acute CVA (cerebrovascular accident) ([place]) 01/20/2021   GERD (gastroesophageal reflux disease)    Coronary artery disease    Depression  01/06/2020   Pneumonia 01/05/2020   History of chest pain 09/26/2018   Dyspnea on exertion 09/08/2018   Chest pain, rule out acute myocardial infarction 10/30/2017   Edema 01/25/2017   Tobacco abuse 06/10/2016   Headache 09/15/2015   OSA (obstructive sleep apnea) 07/30/2015   Nephrolithiasis 07/30/2015   Vasovagal syncope 07/30/2015   Carotid artery stenosis, asymptomatic 07/30/2015   CAD (coronary artery disease) 08/22/2013   Essential hypertension 08/22/2013   Diabetes mellitus without complication ([place])    Dyslipidemia    Seizures ([place])    Generalized convulsive epilepsy ([place]) 03/22/2013   Memory deficit 03/22/2013    Alda Lea, PT 04/28/2021, 7:53 PM  [facility] [address] [place], [place], [address] Phone: [phone]   Fax:  [phone]  Name: Samantha Huffman MRN: [id number] Date of Birth: Jun 03, 1956

## 2021-05-01 ENCOUNTER — Other Ambulatory Visit: Payer: Self-pay

## 2021-05-01 ENCOUNTER — Ambulatory Visit: Payer: Medicare HMO

## 2021-05-01 ENCOUNTER — Encounter: Payer: Self-pay | Admitting: Physical Therapy

## 2021-05-01 ENCOUNTER — Ambulatory Visit: Payer: Medicare HMO | Admitting: Physical Therapy

## 2021-05-01 ENCOUNTER — Ambulatory Visit: Payer: Medicare HMO | Admitting: Occupational Therapy

## 2021-05-01 ENCOUNTER — Encounter: Payer: Self-pay | Admitting: Occupational Therapy

## 2021-05-01 VITALS — BP 176/93 | HR 72

## 2021-05-01 VITALS — BP 150/82

## 2021-05-01 DIAGNOSIS — R2689 Other abnormalities of gait and mobility: Secondary | ICD-10-CM | POA: Diagnosis not present

## 2021-05-01 DIAGNOSIS — M6281 Muscle weakness (generalized): Secondary | ICD-10-CM

## 2021-05-01 DIAGNOSIS — R41841 Cognitive communication deficit: Secondary | ICD-10-CM

## 2021-05-01 DIAGNOSIS — R471 Dysarthria and anarthria: Secondary | ICD-10-CM

## 2021-05-01 DIAGNOSIS — R278 Other lack of coordination: Secondary | ICD-10-CM

## 2021-05-01 DIAGNOSIS — I69318 Other symptoms and signs involving cognitive functions following cerebral infarction: Secondary | ICD-10-CM

## 2021-05-01 DIAGNOSIS — R41840 Attention and concentration deficit: Secondary | ICD-10-CM

## 2021-05-01 DIAGNOSIS — R4701 Aphasia: Secondary | ICD-10-CM

## 2021-05-01 DIAGNOSIS — R270 Ataxia, unspecified: Secondary | ICD-10-CM

## 2021-05-01 DIAGNOSIS — R41842 Visuospatial deficit: Secondary | ICD-10-CM

## 2021-05-01 NOTE — Therapy (Signed)
[facility] [address] [place], [place], [address] Phone: [phone]   Fax:  [phone]  Occupational Therapy Treatment  Patient Details  Name: Samantha Huffman MRN: [id number] Date of Birth: 01-Jul-1956 Referring Provider (OT): Danella Sensing, NP   Encounter Date: 05/01/2021   OT End of Session - 05/01/21 0956     Visit Number 16    Number of Visits 25    Date for OT Re-Evaluation 06/08/21    Authorization Type [employer] Medicare (recert completed 6/96/29 due to delay in start of OT due to schedule conflicts)    Authorization - Visit Number 16    Progress Note Due on Visit 74    OT Start Time 0932    OT Stop Time 0956    OT Time Calculation (min) 24 min    Activity Tolerance Patient tolerated treatment well    Behavior During Therapy [facility] for tasks assessed/performed             Past Medical History:  Diagnosis Date   Carotid artery stenosis    1-39% bilateral stenosis by dopplers 08/2020   Coronary artery disease    a. s/p CABG 10/2012.  cath 05/2016 with moderate LM stenosis, mild LCx and RCA stenosis and occluded LAD with patent LIMA to LAD, free radial to OM.  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   Dyslipidemia    Family history of adverse reaction to anesthesia    mother and sister ponv   Generalized convulsive epilepsy without mention of intractable epilepsy    GERD (gastroesophageal reflux disease)    History of kidney stones    Hypertension    Memory deficit    slight   Migraine    "controlled on daily RX " (06/11/2016)   Nephrolithiasis 07/30/2015   S/p lithotripsy x 3 and right and left ureter stents   OSA (obstructive sleep apnea)    cpap    PONV (postoperative nausea and vomiting)    Seizures ([place])    "controlled w/daily RX; started in 2012; dr thinks they might be from the migraines; last sz was early part of 2016" (06/11/2016)   Type II diabetes mellitus ([place])    metformin   Vasovagal  syncope 07/30/2015   2011    Past Surgical History:  Procedure Laterality Date   CARDIAC CATHETERIZATION  2014; 06/11/2016   CARDIAC CATHETERIZATION N/A 06/11/2016   Procedure: Left Heart Cath and Cors/Grafts Angiography;  Surgeon: Sherren Mocha, MD;  Location: [facility];  Service: Cardiovascular;  Laterality: N/A;   CESAREAN SECTION     CORONARY ANGIOPLASTY     CORONARY ARTERY BYPASS GRAFT  March, 2014   LIMA to LAD, left radial to LCx x 2   CYSTOSCOPY W/ URETERAL STENT PLACEMENT Left 01/16/2019   Procedure: CYSTOSCOPY WITH RETROGRADE PYELOGRAM/URETERAL LEFT STENT PLACEMENT;  Surgeon: Ardis Hughs, MD;  [facility];  Service: Urology;  Laterality: Left;   CYSTOSCOPY/URETEROSCOPY/HOLMIUM LASER/STENT PLACEMENT Left 01/24/2019   Procedure: CYSTOSCOPY, LEFT URETEROSCOPY, HOLMIUM LASER, STONE REMOVAL AND STENT EXCHANGE;  Surgeon: Ardis Hughs, MD;  [facility];  Service: Urology;  Laterality: Left;   LITHOTRIPSY  X 3   RIGHT/LEFT HEART CATH AND CORONARY/GRAFT ANGIOGRAPHY N/A 09/08/2018   Procedure: RIGHT/LEFT HEART CATH AND CORONARY/GRAFT ANGIOGRAPHY;  Surgeon: Martinique, Peter M, MD;  Location: [facility];  Service: Cardiovascular;  Laterality: N/A;   TOTAL ABDOMINAL HYSTERECTOMY     ovaries took before hysterectomy   TUBAL  LIGATION     URETERAL STENT PLACEMENT      Vitals:   05/01/21 0937  BP: (!) 150/82     Subjective Assessment - 05/01/21 1051     Pertinent History CVA with R-sided weakness/ataxia.  PMH:  CAD, HTN, DM, OSA, seizure disorder, dyslipidemia, migraine, CABG (2014), recent bilateral cataract surgeries just prior to CVA    Limitations fall risk, ataxia, visual deficits    Patient Stated Goals return to driving, return to living alone, cook    Currently in Pain? No/denies                      Therapist checked progress towards goals and discussed plans for d/c.              OT Short Term Goals - 05/01/21  0940       OT SHORT TERM GOAL #1   Title Pt will be independent with initial HEP.--Check initial STGs (#1-4)  04/09/21    Time 4    Period Weeks    Status Achieved      OT SHORT TERM GOAL #2   Title Pt will demo improved coordination and functional reach for ADLs as shown by improving score on box and blocks test by at least 6.    Baseline 42 blocks    Time 4    Period Weeks    Status Partially Met   05/01/21-42 blocks     OT SHORT TERM GOAL #3   Title Pt will perform bathing with min A.    Time 4    Period Weeks    Status Achieved   03/30/21:  min A for washing, needs assist for transfer due to prefer bath     OT SHORT TERM GOAL #4   Title Pt will perofrm UB dressing mod I.    Time 4    Period Weeks    Status Achieved      OT SHORT TERM GOAL #5   Title Pt will perform LB dressing with min A.--check STGs #5-8  05/10/21    Time 8    Period Weeks    Status Achieved      OT SHORT TERM GOAL #6   Title Pt will perform bathing with supervision except transfer (pt prefers bath and will need A due to safety, has been provided with info for tub transfer bench).    Time 8    Period Weeks    Status Deferred   04/06/21:  min A due to pt preference     OT SHORT TERM GOAL #7   Title Pt will peform simple home maintenane task and snack prep mod I.    Time 8    Period Weeks    Status Achieved   04/06/21:  pt performing simple tasks (dusting, making coffee,  microwaving, wiping counters)     OT SHORT TERM GOAL #8   Title Pt will cut food mod I using AE prn.    Time 8    Period Weeks    Status Achieved   met using rocker knife, info provided              OT Long Term Goals - 05/01/21 0939       OT LONG TERM GOAL #1   Title Pt will be independent with updated HEP--check LTGs 06/08/21    Time 12    Period Weeks    Status Achieved      OT LONG  TERM GOAL #2   Title Pt will perform BADLs mod I with AE/DME and strategies prn.    Time 12    Period Weeks    Status Partially Met    Pt's family assists pt with tub transfer -05/01/21, mod I with all remaining ADLS     OT LONG TERM GOAL #3   Title Pt wil be for memory/cognitive compensation strategies for ADL/IADLs prn.    Time 12    Period Weeks    Status Achieved   04/08/21- issued memory compensations     OT LONG TERM GOAL #4   Title Pt will perform simple cooking task with supervision.    Time 12    Period Weeks    Status Achieved      OT LONG TERM GOAL #5   Title Pt will perform environmental scanning/navigation with at least 90% accuracy (with head turns) without reports of dizziness/LOB.    Time 12    Period Weeks    Status Achieved   14/15-93%     OT LONG TERM GOAL #6   Title Pt will improve RUE coordination as shown by completing 9-hole peg test in less than 36sec without drops.    Time 12    Period Weeks    Status Not Met   05/01/21- not met 80 secs                  Plan - 05/01/21 1049     Clinical Impression Statement Therapist checked progress towards goals. Pt is in agreement with d/c. Pt is going to see MD today as her BP was elevated with PT.    OT Occupational Profile and History Detailed Assessment- Review of Records and additional review of physical, cognitive, psychosocial history related to current functional performance    Occupational performance deficits (Please refer to evaluation for details): ADL's;IADL's;Leisure;Social Participation    Body Structure / Function / Physical Skills ADL;Decreased knowledge of use of DME;Strength;GMC;Dexterity;Balance;UE functional use;IADL;Endurance;ROM;Vision;Improper spinal/pelvic alignment;Coordination;Mobility;FMC    Rehab Potential Good    OT Frequency 2x / week    OT Duration 12 weeks    OT Treatment/Interventions Self-care/ADL training;Moist Heat;DME and/or AE instruction;Balance training;Aquatic Therapy;Therapeutic activities;Cognitive remediation/compensation;Therapeutic exercise;Cryotherapy;Neuromuscular education;Functional Mobility  Training;Passive range of motion;Visual/perceptual remediation/compensation;Patient/family education;Manual Therapy;Energy conservation;Paraffin;Fluidtherapy    Plan d/c OT    Consulted and Agree with Plan of Care Patient             Patient will benefit from skilled therapeutic intervention in order to improve the following deficits and impairments:   Body Structure / Function / Physical Skills: ADL, Decreased knowledge of use of DME, Strength, GMC, Dexterity, Balance, UE functional use, IADL, Endurance, ROM, Vision, Improper spinal/pelvic alignment, Coordination, Mobility, FMC       Visit Diagnosis: Other lack of coordination  Other symptoms and signs involving cognitive functions following cerebral infarction  Visuospatial deficit  Attention and concentration deficit  Muscle weakness (generalized)  Ataxia  OCCUPATIONAL THERAPY DISCHARGE SUMMARY    Current functional level related to goals / functional outcomes: Pt made good overall progress, however she did not fully achieve all goals due to ataxia. Pt's ataxia on day of d/c was increased as pt was worried about her BP being elevated with PT. Pt to see MD today.   Remaining deficits: Ataxia, decreased balance, decreased coordination   Education / Equipment: Pt was instructed in HEP, and safety for ADLs/ IADLs. Pt demonstrates understanding of all education.   Patient agrees to discharge. Patient goals  were partially met. Patient is being discharged due to being pleased with the current functional level..     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[place]) 03/09/2021   Cerebellar stroke ([place]) 01/30/2021   Acute cerebrovascular accident (CVA) ([place]) 01/20/2021   Acute CVA (cerebrovascular accident) ([place]) 01/20/2021   GERD (gastroesophageal reflux disease)    Coronary artery disease    Depression 01/06/2020   Pneumonia 01/05/2020   History of chest pain 09/26/2018    Dyspnea on exertion 09/08/2018   Chest pain, rule out acute myocardial infarction 10/30/2017   Edema 01/25/2017   Tobacco abuse 06/10/2016   Headache 09/15/2015   OSA (obstructive sleep apnea) 07/30/2015   Nephrolithiasis 07/30/2015   Vasovagal syncope 07/30/2015   Carotid artery stenosis, asymptomatic 07/30/2015   CAD (coronary artery disease) 08/22/2013   Essential hypertension 08/22/2013   Diabetes mellitus without complication ([place])    Dyslipidemia    Seizures ([place])    Generalized convulsive epilepsy ([place]) 03/22/2013   Memory deficit 03/22/2013    ,, OT/L 05/01/2021, 10:51 AM  [facility] [address] [place], [place], [address] Phone: [phone]   Fax:  [phone]  Name: Donella Pascarella MRN: [id number] Date of Birth: [birth year]

## 2021-05-01 NOTE — Therapy (Signed)
[facility] [address] [place], [place], [address] Phone: [phone]   Fax:  [phone]  Speech Language Pathology Treatment/Discharge Summary  Patient Details  Name: Samantha Huffman MRN: [id number] Date of Birth: November 15, 1955 Referring Provider (SLP): Lauraine Rinne, [place]   Encounter Date: 05/01/2021   End of Session - 05/01/21 0831     Visit Number 16    Number of Visits 25    Date for SLP Re-Evaluation 05/20/21    SLP Start Time 0845    SLP Stop Time  0930    SLP Time Calculation (min) 45 min    Activity Tolerance Patient tolerated treatment well             Past Medical History:  Diagnosis Date   Carotid artery stenosis    1-39% bilateral stenosis by dopplers 08/2020   Coronary artery disease    a. s/p CABG 10/2012.  cath 05/2016 with moderate LM stenosis, mild LCx and RCA stenosis and occluded LAD with patent LIMA to LAD, free radial to OM.  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   Dyslipidemia    Family history of adverse reaction to anesthesia    mother and sister ponv   Generalized convulsive epilepsy without mention of intractable epilepsy    GERD (gastroesophageal reflux disease)    History of kidney stones    Hypertension    Memory deficit    slight   Migraine    "controlled on daily RX " (06/11/2016)   Nephrolithiasis 07/30/2015   S/p lithotripsy x 3 and right and left ureter stents   OSA (obstructive sleep apnea)    cpap    PONV (postoperative nausea and vomiting)    Seizures ([place])    "controlled w/daily RX; started in 2012; dr thinks they might be from the migraines; last sz was early part of 2016" (06/11/2016)   Type II diabetes mellitus ([place])    metformin   Vasovagal syncope 07/30/2015   2011    Past Surgical History:  Procedure Laterality Date   CARDIAC CATHETERIZATION  2014; 06/11/2016   CARDIAC CATHETERIZATION N/A 06/11/2016   Procedure: Left Heart Cath and Cors/Grafts  Angiography;  Surgeon: Sherren Mocha, MD;  Location: [facility];  Service: Cardiovascular;  Laterality: N/A;   CESAREAN SECTION     CORONARY ANGIOPLASTY     CORONARY ARTERY BYPASS GRAFT  March, 2014   LIMA to LAD, left radial to LCx x 2   CYSTOSCOPY W/ URETERAL STENT PLACEMENT Left 01/16/2019   Procedure: CYSTOSCOPY WITH RETROGRADE PYELOGRAM/URETERAL LEFT STENT PLACEMENT;  Surgeon: Ardis Hughs, MD;  Location: [facility];  Service: Urology;  Laterality: Left;   CYSTOSCOPY/URETEROSCOPY/HOLMIUM LASER/STENT PLACEMENT Left 01/24/2019   Procedure: CYSTOSCOPY, LEFT URETEROSCOPY, HOLMIUM LASER, STONE REMOVAL AND STENT EXCHANGE;  Surgeon: Ardis Hughs, MD;  Location: [facility];  Service: Urology;  Laterality: Left;   LITHOTRIPSY  X 3   RIGHT/LEFT HEART CATH AND CORONARY/GRAFT ANGIOGRAPHY N/A 09/08/2018   Procedure: RIGHT/LEFT HEART CATH AND CORONARY/GRAFT ANGIOGRAPHY;  Surgeon: Martinique, Peter M, MD;  Location: [facility];  Service: Cardiovascular;  Laterality: N/A;   TOTAL ABDOMINAL HYSTERECTOMY     ovaries took before hysterectomy   TUBAL LIGATION     URETERAL STENT PLACEMENT      There were no vitals filed for this visit.   Subjective Assessment - 05/01/21 0856     Subjective "I graduate today"    Currently in Pain? No/denies  SPEECH THERAPY DISCHARGE SUMMARY  Visits from Start of Care: 16  Current functional level related to goals / functional outcomes: Samantha Huffman exhibited improvements in aphasia, with rare anomia or paraphasias exhibited in conversation now. Improvements in WNL pitch exhibited, which improves with intermittent verbal and non-verbal cues due to inconsistent maintenance and awareness. Pt benefits from cues to slow rate for increased speech intelligibility and safety overall. Pt is pleased with current progress and requested ST discharge this date. Pt verbalized understanding and agreement with ST discharge.    Remaining deficits: Mod cognitive  deficits, variation in WNL pitch   Education / Equipment: HEP, VNeST, dysarthria/anomia compensations, safety precautions   Patient agrees to discharge. Patient goals were partially met. Patient is being discharged due to being pleased with the current functional level..          ADULT SLP TREATMENT - 05/01/21 0827       General Information   Behavior/Cognition Alert;Pleasant mood;Cooperative;Distractible;Impulsive      Treatment Provided   Treatment provided Cognitive-Linquistic      Cognitive-Linquistic Treatment   Treatment focused on Dysarthria;Cognition;Aphasia    Skilled Treatment Pt expressed excitement for ST discharge today as pt is pleased with current progress and does not desire additional ST at this time. Pitch noted to vary spontaneously in conversation, although WNL voice intermittently achieved independently. Occasional cues required to decrease high pitch speech, which typically happened when pt got excited. Pt stated variations in pitch "comes and goes." SLP reviewed vocal tract relaxation techniques, in which pt was focused on own technique to press hand on chest to reduce tension. In mod complex conversation, pt exhibited anomia x1, in which pt utilized description strategy x1 to explain pill organizer. Pt reportedly taking medication independently with occasional check-in from daughter reported to ensure meds taken. Pt does not report any missed or inaccurate doses. SLP re-educated recommendations for slow rate for speech and all movements due to impulsivity. Pt stated "I know I have to slow down."      Assessment / Recommendations / Plan   Plan Discharge SLP treatment due to (comment)   pt is pleased with current progress     Progression Toward Goals   Progression toward goals Goals partially met, education completed, patient discharged from [facility] - 05/01/21 0855     Education Details review fo previous SLP recommendations     Person(s) Educated Patient    Methods Explanation;Demonstration    Comprehension Verbalized understanding;Returned demonstration              SLP Short Term Goals - 04/13/21 1013       SLP SHORT TERM GOAL #1   Title pt will engage in cognitive linguistic and speech strength/coordination testing and goals added as necessary    Status Achieved    Target Date 03/05/21      SLP SHORT TERM GOAL #2   Title using compensations, Samantha Huffman will remind family when she needs to take meds (pt is now dependent upon family) 80% of opportunities between 3 sessions    Status Not Met    Target Date 03/20/21      SLP SHORT TERM GOAL #3   Title pt will name 10 items in a simple category with rare verbal cues x3 sessions    Baseline 03-23-21    Status Not Met    Target Date 03/20/21      SLP SHORT TERM GOAL #  4   Title pt will independently use anomia compensations in 5 minutes simple conversation x3 sessions    Baseline 03-12-21, 03-23-21    Status Not Met    Target Date 03/20/21      SLP SHORT TERM GOAL #5   Title pt will exhibit 100% intelligibility in 5 minutes simple conversation using speech compensations, x3 sessions    Status Partially Met    Target Date 03/20/21      SLP SHORT TERM GOAL #6   Title Samantha Huffman will complete a speech HEP with rare min A x2 sessions    Status Not Met    Target Date 03/20/21      SLP SHORT TERM GOAL #7   Title Pt will verbalize and demonstrate 3 attention/memory strategies to aid functioning home with min A over 2 sessions    Status Not Met    Target Date 03/20/21              SLP Long Term Goals - 05/01/21 0835       SLP LONG TERM GOAL #1   Title pt will complete speech HEP with occasional min A x3 sessions    Status Partially Met      SLP LONG TERM GOAL #2   Title in 7 minutes simple conversation pt will exhibit functional fluid speech using compensations in 2 sessions    Status Partially Met      SLP LONG TERM GOAL #3   Title in 8 minutes mod  complex conversation pt will exhibit functional fluid speech using compensations in 2 sessions    Status Partially Met      SLP LONG TERM GOAL #4   Title pt will complete VNeST and SFA tasks with occasoinal min A in 3 sessions    Time 5    Period Weeks    Status Deferred   pt denies anomia/dysnomia     SLP LONG TERM GOAL #5   Title Samantha Huffman will exhibit functional word-finding using compensations in 8 mintues mod complex conversation in 3 sessions    Baseline 03-30-21, 04-06-21    Status Achieved      SLP LONG TERM GOAL #6   Title Samantha Huffman will independently take meds for 10 days as reported by Samantha Huffman or family    Status Achieved   per pt report     SLP LONG TERM GOAL #7   Title Pt will verbalize and demonstrate 3 attention/memory strategies to aid functioning at work and home with rare min A over 2 sessions    Status Not Met              Plan - 05/01/21 0832     Clinical Impression Statement 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 Anomia improved. At this time, she is intelligible, and is still somewhat dependent on family and friends for daily tasks. Pt is satisfied with her current functional level and requested d/c from ST this session. SLP completed discharge summary with review of progress and current recommendations. Pt verbalized understanding and agreement with ST discharge.    Treatment/Interventions Language facilitation;Cueing hierarchy;SLP instruction and feedback;Cognitive reorganization;Compensatory strategies;Internal/external aids;Patient/family education;Functional tasks;Environmental controls;Oral motor exercises    Potential to Achieve Goals Good    Consulted and Agree with Plan of Care Patient             Patient will benefit from skilled therapeutic intervention in order to improve the following deficits and impairments:   Dysarthria and  anarthria  Aphasia  Cognitive communication deficit    Problem  List Patient Active Problem List   Diagnosis Date Noted   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/30/2021   Acute cerebrovascular accident (CVA) ([facility]) 01/20/2021   Acute CVA (cerebrovascular accident) ([facility]) 01/20/2021   GERD (gastroesophageal reflux disease)    Coronary artery disease    Depression 01/06/2020   Pneumonia 01/05/2020   History of chest pain 09/26/2018   Dyspnea on exertion 09/08/2018   Chest pain, rule out acute myocardial infarction 10/30/2017   Edema 01/25/2017   Tobacco abuse 06/10/2016   Headache 09/15/2015   OSA (obstructive sleep apnea) 07/30/2015   Nephrolithiasis 07/30/2015   Vasovagal syncope 07/30/2015   Carotid artery stenosis, asymptomatic 07/30/2015   CAD (coronary artery disease) 08/22/2013   Essential hypertension 08/22/2013   Diabetes mellitus without complication ([place])    Dyslipidemia    Seizures ([place])    Generalized convulsive epilepsy ([place]) 03/22/2013   Memory deficit 03/22/2013    Samantha Deem, MA CCC-SLP 05/01/2021, 9:19 AM  [facility] [address] [place], [place], [address] Phone: [phone]   Fax:  [phone]   Name: Samantha Huffman MRN: [id number] Date of Birth: [birth year]

## 2021-05-01 NOTE — Patient Instructions (Signed)
Warning Signs of a Stroke A stroke is a medical emergency and should be treated right away--every second counts. A stroke is caused by a decrease or block in blood flow to the brain. When certain areas of the brain do not get enough oxygen, brain cells begin to die. A stroke can lead to brain damage and can sometimes be life-threatening. However, if a person gets medical treatment right away, he or she has a better chance of surviving and recovering from a stroke. It is very important to be able to recognize the symptoms of a stroke. Types of strokes There are two main types of strokes: Ischemic stroke. This is the most common type. This stroke happens when a blood vessel that supplies blood to the brain is blocked. Hemorrhagic stroke. This results from bleeding in the brain when a blood vessel leaks or bursts (ruptures). A transient ischemic attack (TIA) causes stroke-like symptoms that go away quickly. Unlike a stroke, a TIA does not cause permanent damage to the brain. However, the symptoms of a TIA are the same as a stroke. TIAs also require medical treatment right away. Having a TIA is a sign that you are at higher risk for a stroke. Warning signs of a stroke The symptoms of stroke may vary and will reflect the part of the brain that is involved. Symptoms usually happen suddenly. "BE FAST" is an easy way to remember the main warning signs of a stroke: B - Balance. Signs are dizziness, sudden trouble walking, or loss of balance. E - Eyes. Signs are trouble seeing or a sudden change in vision. F - Face. Signs are sudden weakness or numbness of the face, or the face or eyelid drooping on one side. A - Arms. Signs are weakness or numbness in an arm. This happens suddenly and usually on one side of the body. S - Speech. Signs are sudden trouble speaking, slurred speech, or trouble understanding what people say. T - Time. Time to call emergency services. Write down what time symptoms started. Other  signs of a stroke Some less common signs of a stroke include: A sudden, severe headache with no known cause. Nausea or vomiting. Seizure. A stroke may be happening even if only one "BE FAST" symptom is present. These symptoms may represent a serious problem that is an emergency. Do not wait to see if the symptoms will go away. Get medical help right away. Call your local emergency services (911 in the [place] Do not drive yourself to the hospital. Summary A stroke is a medical emergency and should be treated right away--every second counts. "BE FAST" is an easy way to remember the main warning signs of a stroke. Call your local emergency services right away if you or someone else has any stroke symptoms, even if the symptoms go away. Make note of what time the first symptoms appeared. Emergency responders or emergency room staff will need to know this information. Do not wait to see if symptoms will go away. Call 911 even if only one of the "BE FAST" symptoms appears. This information is not intended to replace advice given to you by your health care provider. Make sure you discuss any questions you have with your health care provider. Document Revised: 03/10/2020 Document Reviewed: 03/10/2020 Elsevier Patient Education  [place].

## 2021-05-01 NOTE — Therapy (Addendum)
[facility] [address] [place], [place], [address] Phone: [phone]   Fax:  [phone]  Physical Therapy Treatment/Discharge Summary  Patient Details  Name: Samantha Huffman MRN: [id number] Date of Birth: [birth year] Referring Provider (PT): Lauraine Rinne, PA-C   Encounter Date: 05/01/2021   PT End of Session - 05/01/21 1032     Visit Number 16    Number of Visits 25    Date for PT Re-Evaluation 05/15/21    Authorization Type [employer]    Authorization Time Period 02-19-21 - 05-21-21    Progress Note Due on Visit 10    PT Start Time 0804    PT Stop Time 0845    PT Time Calculation (min) 41 min    Equipment Utilized During Treatment Gait belt    Activity Tolerance Patient tolerated treatment well   limited by high BP   Behavior During Therapy [facility] for tasks assessed/performed             Past Medical History:  Diagnosis Date   Carotid artery stenosis    1-39% bilateral stenosis by dopplers 08/2020   Coronary artery disease    a. s/p CABG 10/2012.  cath 05/2016 with moderate LM stenosis, mild LCx and RCA stenosis and occluded LAD with patent LIMA to LAD, free radial to OM.  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   Dyslipidemia    Family history of adverse reaction to anesthesia    mother and sister ponv   Generalized convulsive epilepsy without mention of intractable epilepsy    GERD (gastroesophageal reflux disease)    History of kidney stones    Hypertension    Memory deficit    slight   Migraine    "controlled on daily RX " (06/11/2016)   Nephrolithiasis 07/30/2015   S/p lithotripsy x 3 and right and left ureter stents   OSA (obstructive sleep apnea)    cpap    PONV (postoperative nausea and vomiting)    Seizures ([place])    "controlled w/daily RX; started in 2012; dr thinks they might be from the migraines; last sz was early part of 2016" (06/11/2016)   Type II diabetes mellitus ([place])     metformin   Vasovagal syncope 07/30/2015   2011    Past Surgical History:  Procedure Laterality Date   CARDIAC CATHETERIZATION  2014; 06/11/2016   CARDIAC CATHETERIZATION N/A 06/11/2016   Procedure: Left Heart Cath and Cors/Grafts Angiography;  Surgeon: Sherren Mocha, MD;  Location: [facility];  Service: Cardiovascular;  Laterality: N/A;   CESAREAN SECTION     CORONARY ANGIOPLASTY     CORONARY ARTERY BYPASS GRAFT  March, 2014   LIMA to LAD, left radial to LCx x 2   CYSTOSCOPY W/ URETERAL STENT PLACEMENT Left 01/16/2019   Procedure: CYSTOSCOPY WITH RETROGRADE PYELOGRAM/URETERAL LEFT STENT PLACEMENT;  Surgeon: Ardis Hughs, MD;  Location: [facility];  Service: Urology;  Laterality: Left;   CYSTOSCOPY/URETEROSCOPY/HOLMIUM LASER/STENT PLACEMENT Left 01/24/2019   Procedure: CYSTOSCOPY, LEFT URETEROSCOPY, HOLMIUM LASER, STONE REMOVAL AND STENT EXCHANGE;  Surgeon: Ardis Hughs, MD;  Location: [facility];  Service: Urology;  Laterality: Left;   LITHOTRIPSY  X 3   RIGHT/LEFT HEART CATH AND CORONARY/GRAFT ANGIOGRAPHY N/A 09/08/2018   Procedure: RIGHT/LEFT HEART CATH AND CORONARY/GRAFT ANGIOGRAPHY;  Surgeon: Martinique, Peter M, MD;  Location: [facility];  Service: Cardiovascular;  Laterality: N/A;   TOTAL ABDOMINAL HYSTERECTOMY     ovaries took before hysterectomy  TUBAL LIGATION     URETERAL STENT PLACEMENT      Vitals:   05/01/21 0824 05/01/21 0842  BP: (!) 173/92 (!) 176/93  Pulse: 73 72     Subjective Assessment - 05/01/21 0806     Subjective No changes since she was last here. No falls. Only uses her walker when coming into therapy.    Pertinent History Carotid artery stenosis, HTN, epilepsy with seizure disorder - last seizure was in early 2016:  migraines, DM, memory deficit, CAD,    Diagnostic tests CT of head and neck:  MRA    Patient Stated Goals Improve balance and walking    Currently in Pain? No/denies    Pain Onset In the past 7 days   since Saturday                               [facility] Adult PT Treatment/Exercise - 05/01/21 0808       Ambulation/Gait   Ambulation/Gait Yes    Ambulation/Gait Assistance 5: Supervision    Ambulation/Gait Assistance Details cues for foot clearance outdoors over unlevel surfaces. BP after: 208/101 HR: 79    Ambulation Distance (Feet) 500 Feet    Assistive device None    Gait Pattern Step-through pattern    Ambulation Surface Level;Indoor;Unlevel;Outdoor;Paved;Grass    Curb 5: Supervision    Curb Details (indicate cue type and reason) no AD, cues for sequencing when ascending, x2 reps outdoors      Therapeutic Activites    Therapeutic Activities Other Therapeutic Activities    Other Therapeutic Activities Pt's BP elevated after gait outdoors at 208/101, with pt reporting that she felt asymptomatic. Reports that she took her BP medication this morning and has not been checking it at home due to her BP machine being broken. Assessed BP again after prolonged seated rest break at 173/92. Called pt's daughter and made her aware of pt's BP values and importance of having BP machine at home to monitor daily.. PT called pt's PCP's office and made them aware of this information, PCP stated that they can get pt in today if daughter can give them a call to schedule. Relayed information to pt's daughter who was going to make appt and take pt to PCP after her therapies this morning. Provided handout to pt for warning signs/sx of a CVA, pt appreciative of information.                     PT Education - 05/01/21 1031     Education Details see therapeutic activity above    Person(s) Educated Patient   pt's daughter   Methods Explanation;Handout    Comprehension Verbalized understanding              PT Short Term Goals - 04/28/21 1942       PT SHORT TERM GOAL #1   Title Improve Berg score from 34/56 to >/= 39/56 for reduced fall risk.    Baseline 03/18/21: 50/56 scored today    Status  Achieved      PT SHORT TERM GOAL #2   Title Improve TUG score from 17.32 secs to </= 14.5 secs without use of device to increase safety with ambulation.    Baseline 03/18/21: 14.41 secs with RW, 15.18 sec's no AD, met with RW not no AD;  11.87 secs - 04-21-21    Status Achieved    Target Date 04/17/21  PT SHORT TERM GOAL #3   Title Increase gait velocity from 2.25 ft/sec without device to >/= 2.6 ft/sec with CGA for incr. gait efficiency.    Baseline 27/27/22: 2.41 ft/sec with RW with supervision, 2.65 ft/sec no AD with min guard assist, met with no AD;   04-21-21 -- 10.81 secs = 3.03    Status Achieved      PT SHORT TERM GOAL #4   Title Assess DGI and establish goal as appropriate. Updated STG:  Increase DGI score from 13/24 to >/= 18/24 to reduce fall risk.    Baseline 03/20/21: 13/24 scored today as baseline, baseline added to LTG; 19/24 on 04-21-21    Status Achieved    Target Date 04/17/21      PT SHORT TERM GOAL #5   Title Pt wil amb. 350' with CGA on flat, even surface without LOB.; Updated STG;  Amb. 350' with supervision without use of RW, including amb. to all therapies without RW without LOB.    Baseline 03/18/21: met in session with RW; pt using RW to amb. to/from PT but states she is only using RW at home at night when she goes to the bathroom - 04-21-21    Status Partially Met    Target Date 04/17/21      PT SHORT TERM GOAL #6   Title Independent in HEP for balance and strengthening exercises.    Baseline 03/18/21: met with current HEP per pt report    Status Achieved    Target Date 03/20/21               PT Long Term Goals - 05/01/21 1037       PT LONG TERM GOAL #1   Title Improve Berg score from 34/56 to >/= 50/56 to reduce fall risk.. (all LTGs due 05/22/21)    Baseline 34/56 on 02-19-21; 55/56 on 04-28-21    Time 12    Period Weeks    Status Achieved      PT LONG TERM GOAL #2   Title Pt will ambulate 1000' modified independently without device on even and  uneven surfaces.    Baseline 500' with supervision on 05/01/21 on unlevel surfaces    Time 12    Period Weeks    Status Not Met      PT LONG TERM GOAL #3   Title Improve TUG score from 17.32 secs to </= 13 secs to reduce fall risk.    Baseline 17.32 secs on 02-19-21 with CGA; 11.87 secs on 04-21-21; 10.47 secs on 04-28-21    Time 12    Period Weeks    Status Achieved      PT LONG TERM GOAL #4   Title Improve DGI by at least 10 points to increase safety with gait.    Baseline 03/20/21: 13/24   04-28-21 19/24    Time 12    Period Weeks    Status Partially Met      PT LONG TERM GOAL #5   Title Independent in updated HEP as appropriate.    Baseline met 04-28-21    Time 12    Period Weeks    Status Achieved      PT LONG TERM GOAL #6   Title Increase LE FOTO score by at least 10 points to demo improvement in functional status.    Baseline 59/100 on 02-19-21; D/C FOTO score 62.8/100 on 04-28-21    Time 12    Period Weeks    Status Not  Met                   Plan - 05/01/21 1039     Clinical Impression Statement Checked last LTG today - did not meet LTG #2, performed gait outdoors with no AD with supervision at 500'. After gait outdoors pt's BP was elevated (see values above). Pt called pt's daughter and PCP regarding elevated BP today and pt going to see PCP for appt after therapies today. No further activity performed. Pt asymptomatic throughout session. Due to pt being pleased with her current functional status will D/C from therapy at this time.    Personal Factors and Comorbidities Comorbidity 2;Past/Current Experience;Transportation    Comorbidities migraines, epilepsy with seizure disorder, DM, CAD, s/p cataract sx on bil. eyes, memory deficit    Examination-Activity Limitations Bathing;Stand;Transfers;Carry;Squat;Stairs;Locomotion Level;Hygiene/Grooming;Bend    Examination-Participation Restrictions Cleaning;Community Activity;Driving;Laundry;Shop;Meal Prep    Stability/Clinical  Decision Making Evolving/Moderate complexity    Rehab Potential Good    PT Frequency 2x / week    PT Duration 12 weeks    PT Treatment/Interventions ADLs/Self Care Home Management;Aquatic Therapy;DME Instruction;Gait training;Stair training;Therapeutic activities;Therapeutic exercise;Balance training;Neuromuscular re-education;Patient/family education    PT Next Visit Plan d/c    PT Home Exercise Plan balance HEP issued on 03-12-21    Consulted and Agree with Plan of Care Patient             Patient will benefit from skilled therapeutic intervention in order to improve the following deficits and impairments:  Abnormal gait, Decreased activity tolerance, Decreased endurance, Decreased balance, Decreased coordination, Decreased strength, Impaired UE functional use  Visit Diagnosis: Other lack of coordination  Other abnormalities of gait and mobility  Muscle weakness (generalized)     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   Cerebellar stroke ([place]) 01/30/2021   Acute cerebrovascular accident (CVA) ([place]) 01/20/2021   Acute CVA (cerebrovascular accident) ([place]) 01/20/2021   GERD (gastroesophageal reflux disease)    Coronary artery disease    Depression 01/06/2020   Pneumonia 01/05/2020   History of chest pain 09/26/2018   Dyspnea on exertion 09/08/2018   Chest pain, rule out acute myocardial infarction 10/30/2017   Edema 01/25/2017   Tobacco abuse 06/10/2016   Headache 09/15/2015   OSA (obstructive sleep apnea) 07/30/2015   Nephrolithiasis 07/30/2015   Vasovagal syncope 07/30/2015   Carotid artery stenosis, asymptomatic 07/30/2015   CAD (coronary artery disease) 08/22/2013   Essential hypertension 08/22/2013   Diabetes mellitus without complication ([place])    Dyslipidemia    Seizures ([place])    Generalized convulsive epilepsy ([place]) 03/22/2013   Memory deficit 03/22/2013    Arliss Journey, PT, DPT   05/01/2021, 10:41 AM    PHYSICAL THERAPY DISCHARGE SUMMARY  Visits from Start of Care: 16  Current functional level related to goals / functional outcomes: See above for progress towards goals   Remaining deficits: Continued high level balance and gait deficits Continued decreased coordination RLE   Education / Equipment: Pt has been instructed in balance HEP and also has been educated in signs/symptoms of CVA  Patient agrees to discharge. Patient goals were partially met. Patient is being discharged due to being pleased with the current functional level.  Guido Sander, [facility] [address] [place], [place], [address] Phone: [phone]   Fax:  [phone]  Name: Samantha Huffman MRN: [id number] Date of Birth: [birth year]

## 2021-05-31 ENCOUNTER — Other Ambulatory Visit: Payer: Self-pay | Admitting: Cardiology

## 2021-06-10 ENCOUNTER — Other Ambulatory Visit: Payer: Self-pay | Admitting: Cardiology

## 2021-06-10 ENCOUNTER — Telehealth: Payer: Self-pay | Admitting: Cardiology

## 2021-06-10 NOTE — Telephone Encounter (Signed)
Returned the call to pt regarding refill request for Protonix.  Per Dr. Radford Pax, she prefers pt's PCP take over this medication. Voicemail was full and couldn't leave a message.

## 2021-06-10 NOTE — Telephone Encounter (Signed)
Received a refill request from pt's pharmacy.  I sent a message back declining asking them to contact pt's PCP.

## 2021-06-10 NOTE — Telephone Encounter (Signed)
*  STAT* If patient is at the pharmacy, call can be transferred to refill team.   1. Which medications need to be refilled? (please list name of each medication and dose if known)  pantoprazole (PROTONIX) 40 MG tablet  2. Which pharmacy/location (including street and city if local pharmacy) is medication to be sent to? [id number] - [place], [place] - [address]  3. Do they need a 30 day or 90 day supply?   90 day supply  [name] states the patient will take her last tablet today.

## 2021-06-12 ENCOUNTER — Telehealth: Payer: Self-pay | Admitting: *Deleted

## 2021-06-12 NOTE — Telephone Encounter (Signed)
[name] called the office for refill on Pantoprazole (Protonix) she was advised that per office note Dr. Radford Pax would prefer patient have medication filled with her Primary Care Physician. Daughter stated that she will call PCP.

## 2021-07-20 ENCOUNTER — Ambulatory Visit: Payer: Medicare HMO | Admitting: Neurology

## 2021-07-22 ENCOUNTER — Ambulatory Visit: Payer: Medicare HMO | Admitting: Neurology

## 2021-07-22 ENCOUNTER — Other Ambulatory Visit: Payer: Self-pay

## 2021-07-22 ENCOUNTER — Encounter: Payer: Self-pay | Admitting: Neurology

## 2021-07-22 VITALS — BP 135/80 | HR 64 | Ht 61.0 in | Wt 146.8 lb

## 2021-07-22 DIAGNOSIS — I639 Cerebral infarction, unspecified: Secondary | ICD-10-CM

## 2021-07-22 DIAGNOSIS — R413 Other amnesia: Secondary | ICD-10-CM

## 2021-07-22 DIAGNOSIS — Z8669 Personal history of other diseases of the nervous system and sense organs: Secondary | ICD-10-CM | POA: Diagnosis not present

## 2021-07-22 MED ORDER — TOPIRAMATE 25 MG PO TABS: 75.0000 mg | ORAL_TABLET | Freq: Two times a day (BID) | ORAL | 0 refills | Status: DC

## 2021-07-22 NOTE — Progress Notes (Signed)
[facility] [address] [place]. [place] [address] [phone]       OFFICE FOLLOW-UP NOTE  [name] Date of Birth:  19-Apr-1956 Medical Record Number:  [id number]   HPI: She returns for follow-up after last visit with Dr. Jannifer Huffman on 03/09/2021.  She is accompanied by her grandson.  Patient's main complaint today is memory difficulties which she has had for 10 years she feels that it is not progressive but family feels that it is bothersome.  She has trouble remembering recent information and making new memories.  She struggles with new information.  She lives alone and still cooks for herself and washes and does her dishes and uses the bathroom.  She has stopped driving.  She has not had any evaluation for reversible causes of memory loss or tried any memory medications.  There is no family history of Alzheimer's or dementia.  Patient has no recent symptoms of stroke or TIA.  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  She is currently on Topamax which she had been taking for migraine but has not had a migraine for several years now.  She is willing to reduce and stop it.  She feels she still has some residual speech difficulties from a stroke in May she has sometimes trouble finding words and speaking.  She is still dragging the right side as well and balance is also slightly off and not back to baseline.  She remains on Keppra for seizures and has not had any breakthrough seizures.  She remains on aspirin and Plavix or stroke prevention as well as on Lipitor and Praluent injections for lipids.  Blood pressure well controlled today it is 135/80 Last visit 03/09/2021 Dr. Jannifer Huffman : [name] is a 65 year old right-handed black female with a history of diabetes, dyslipidemia, coronary artery disease, hypertension, tobacco abuse, gastroesophageal reflux disease, history of seizures, history of memory disturbance, history of migraine  headache, history of sleep apnea on CPAP, and a history of renal calculi.  The patient was admitted to the hospital on 20 Jan 2021 with onset of a right hemiataxia syndrome.  The patient was found to have a right superior cerebellar stroke but also had a small stroke in the right posterior inferior cerebellar artery distribution and a left thalamic stroke.  The patient underwent MRA of the head and neck showing occlusion of the right superior cerebellar artery.  Mild stenosis at the origin of the right vertebral artery was noted, the right PICA was not visualized.  The patient had evidence of acute strokes in the right PICA distribution, left thalamus, and right superior cerebellar artery distribution.  The patient has had significant issues with ataxic speech, right hemiataxia, and some memory changes.  The patient had cataract surgery just prior to the stroke event, she had some incomplete return of vision following cataract surgery, this has persisted involving the right eye only, not the left.  She reports some visual blurring in the superior aspects of her vision in the right eye.  The patient is using a walker to get around, she is living with her family.  She requires some assistance with bathing and dressing and she cannot fix meals.  She denies any numbness of extremities or any true weakness of extremities.  She was smoking a pack of cigarettes every 1.5 days, she has quit smoking since the stroke.  She is on aspirin and Plavix combination which she was on prior to  the stroke.  The patient did have a headache around the time of the stroke but this has improved.  She denies any problems with choking with swallowing.  She has done well with her seizure control on Keppra and Topamax. ROS:   14 system review of systems is positive for memory loss, speech difficulties, word finding difficulties and all other systems negative  PMH:  Past Medical History:  Diagnosis Date   Carotid artery stenosis    1-39%  bilateral stenosis by dopplers 08/2020   Coronary artery disease    a. s/p CABG 10/2012.  cath 05/2016 with moderate LM stenosis, mild LCx and RCA stenosis and occluded LAD with patent LIMA to LAD, free radial to OM.  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   Dyslipidemia    Family history of adverse reaction to anesthesia    mother and sister ponv   Generalized convulsive epilepsy without mention of intractable epilepsy    GERD (gastroesophageal reflux disease)    History of kidney stones    Hypertension    Memory deficit    slight   Migraine    "controlled on daily RX " (06/11/2016)   Nephrolithiasis 07/30/2015   S/p lithotripsy x 3 and right and left ureter stents   OSA (obstructive sleep apnea)    cpap    PONV (postoperative nausea and vomiting)    Seizures ([place])    "controlled w/daily RX; started in 2012; dr thinks they might be from the migraines; last sz was early part of 2016" (06/11/2016)   Type II diabetes mellitus ([place])    metformin   Vasovagal syncope 07/30/2015   2011    Social History:  Social History   Socioeconomic History   Marital status: Divorced    Spouse name: Not on file   Number of children: 2   Years of education: 12   Highest education level: Not on file  Occupational History   Occupation: retired  Tobacco Use   Smoking status: Some Days    Packs/day: 0.33    Years: 35.00    Pack years: 11.55    Types: Cigarettes    Last attempt to quit: 06/11/2016    Years since quitting: 5.1   Smokeless tobacco: Former    Types: Snuff, Sarina Ser    Quit date: 11/20/2012  Vaping Use   Vaping Use: Never used  Substance and Sexual Activity   Alcohol use: Yes    Comment: 06/11/2016 "might have a few drinks/month; usually wine"   Drug use: No   Sexual activity: Not Currently  Other Topics Concern   Not on file  Social History Narrative   Patient drinks about 1 cup of coffee daily.   Patient is right handed.    Social Determinants of Health   Financial  Resource Strain: Not on file  Food Insecurity: Not on file  Transportation Needs: Not on file  Physical Activity: Not on file  Stress: Not on file  Social Connections: Not on file  Intimate Partner Violence: Not on file    Medications:   Current Outpatient Medications on File Prior to Visit  Medication Sig Dispense Refill   acetaminophen (TYLENOL) 325 MG tablet Take 1-2 tablets (325-650 mg total) by mouth every 4 (four) hours as needed for mild pain.     Alirocumab (PRALUENT) 75 MG/ML SOAJ INJECT CONTENTS OF 1 PEN INTO THE SKIN EVERY 14 DAYS 2 mL 11   aspirin EC 325 MG tablet Take 1 tablet (325 mg total)  by mouth daily. NEED TO PURCHASE THIS OVER THE COUNTER 360 tablet 0   atorvastatin (LIPITOR) 80 MG tablet Take 1 tablet (80 mg total) by mouth daily. 30 tablet 0   carvedilol (COREG) 3.125 MG tablet Take 1 tablet (3.125 mg total) by mouth daily. 30 tablet 3   clopidogrel (PLAVIX) 75 MG tablet Take 1 tablet (75 mg total) by mouth daily. 30 tablet 0   docusate sodium (COLACE) 100 MG capsule Take 1 capsule (100 mg total) by mouth daily. PURCHASE THIS OVER THE COUNTER 30 capsule 0   escitalopram (LEXAPRO) 10 MG tablet Take 1 tablet (10 mg total) by mouth at bedtime. 30 tablet 0   ezetimibe (ZETIA) 10 MG tablet Take 1 tablet (10 mg total) by mouth at bedtime. 30 tablet 0   isosorbide mononitrate (IMDUR) 30 MG 24 hr tablet Take 0.5 tablets (15 mg total) by mouth daily. Please make yearly appt with Dr. Radford Pax for December 2022 for future refills. Thank you 1st attempt 45 tablet 0   levETIRAcetam (KEPPRA) 500 MG tablet Take 1 tablet (500 mg total) by mouth 2 (two) times daily. 60 tablet 0   lisinopril (ZESTRIL) 20 MG tablet Take 1 tablet (20 mg total) by mouth daily. (Patient taking differently: Take 20 mg by mouth 2 (two) times daily.) 30 tablet 0   metFORMIN (GLUCOPHAGE-XR) 500 MG 24 hr tablet Take 1 tablet (500 mg total) by mouth daily with breakfast. 30 tablet 0   Nicotine 21-14-7 MG/24HR KIT  Use as directed 1 kit 0   nitroGLYCERIN (NITROSTAT) 0.4 MG SL tablet Place 1 tablet (0.4 mg total) under the tongue every 5 (five) minutes x 3 doses as needed for chest pain. 25 tablet 4   pantoprazole (PROTONIX) 40 MG tablet Take 1 tablet (40 mg total) by mouth daily. 30 tablet 2   polyethylene glycol (MIRALAX / GLYCOLAX) 17 g packet Take 17 g by mouth 2 (two) times daily. PURCHASE THIS OVER THE COUNTER 60 each 0   No current facility-administered medications on file prior to visit.    Allergies:   Allergies  Allergen Reactions   Depakote [Divalproex Sodium] Other (See Comments)    Hyperammonemia   Other Other (See Comments)   Penicillins Hives, Other (See Comments) and Rash    Other reaction(s): GI intolerance Has patient had a PCN reaction causing immediate rash, facial/tongue/throat swelling, SOB or lightheadedness with hypotension: YES Has patient had a PCN reaction causing severe rash involving mucus membranes or skin necrosis: NO Has patient had a PCN reaction that required hospitalizationNO Has patient had a PCN reaction occurring within the last 10 years: NO If all of the above answers are "NO", then may proceed with Cephalosporin use. Other reaction(s): GI Upset (intolerance) Other reaction(s): GI intolerance Has patient had a PCN reaction causing immediate rash, facial/tongue/throat swelling, SOB or lightheadedness with hypotension: YES Has patient had a PCN reaction causing severe rash involving mucus membranes or skin necrosis: NO Has patient had a PCN reaction that required hospitalizationNO Has patient had a PCN reaction occurring within the last 10 years: NO If all of the above answers are "NO", then may proceed with Cephalosporin use.    Physical Exam General: well developed, well nourished, seated, in no evident distress Head: head normocephalic and atraumatic.  Neck: supple with no carotid or supraclavicular bruits Cardiovascular: regular rate and rhythm, no  murmurs Musculoskeletal: no deformity Skin:  no rash/petichiae Vascular:  Normal pulses all extremities Vitals:   07/22/21 1311  BP:  135/80  Pulse: 64   Neurologic Exam Mental Status: Awake and fully alert. Oriented to place and time. Recent and remote memory intact. Attention span, concentration and fund of knowledge diminished.. Mood and affect appropriate.  Mini-Mental status exam score 23/30 with deficits in orientation, attention calculation.  Able to name 10 animals which can walk on 4 legs.  Recall 3/3.  Clock drawing 4/4. Cranial Nerves: Fundoscopic exam reveals sharp disc margins. Pupils equal, briskly reactive to light. Extraocular movements full without nystagmus.  Mild saccadic dysmetria on left lateral gaze.  Visual fields full to confrontation. Hearing intact. Facial sensation intact. Face, tongue, palate moves normally and symmetrically.  Motor: Normal bulk and tone. Normal strength in all tested extremity muscles. Sensory.: intact to touch ,pinprick .position and vibratory sensation.  Coordination: Rapid alternating movements normal in all extremities. Finger-to-nose and heel-to-shin performed accurately bilaterally. Gait and Station: Arises from chair without difficulty. Stance is normal. Gait demonstrates normal stride length and balance . Able to heel, toe and tandem walk without difficulty.  Reflexes: 1+ and symmetric. Toes downgoing.   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     PLAN:I had a long discussion with the patient and her grandson regarding her memory loss and cognitive impairment and discussed differential diagnosis and evaluation and treatment plan and answered questions.  Recommend check memory panel labs, EEG and MRI scan of the brain.  I recommend she taper her Topamax to 50 mg twice daily for a week then 25 twice daily for a week and then 25 mg daily for a week and discontinue as her migraines seem resolved and may be contributing to her memory loss.  We also discussed memory compensation strategies.  She will continue Keppra for seizure prophylaxis.  Continue aspirin and Plavix for stroke prevention and maintain aggressive risk factor modification with strict control of hypertension blood pressure goal below 130/90, lipids with LDL cholesterol goal below 70 mg percent and diabetes with hemoglobin A1c goal below 6.5%.  She will return for follow-up in the future in 3 months or call earlier if necessary. Greater than 50% of time during this prolonged 50 minute visit was spent on counseling,explanation of diagnosis, planning of further management, discussion with patient and family and coordination of care Antony Contras, MD Note: This document was prepared with digital dictation and possible smart phrase technology. Any transcriptional errors that result from this process are unintentional

## 2021-07-22 NOTE — Patient Instructions (Signed)
I had a long discussion with the patient and her grandson regarding her memory loss and cognitive impairment and discussed differential diagnosis and evaluation and treatment plan and answered questions.  Recommend check memory panel labs, EEG and MRI scan of the brain.  I recommend she taper her Topamax to 50 mg twice daily for a week then 25 twice daily for a week and then 25 mg daily for a week and discontinue as her migraines seem resolved and may be contributing to her memory loss.  We also discussed memory compensation strategies.  She will continue Keppra for seizure prophylaxis.  Continue aspirin and Plavix for stroke prevention and maintain aggressive risk factor modification with strict control of hypertension blood pressure goal below 130/90, lipids with LDL cholesterol goal below 70 mg percent and diabetes with hemoglobin A1c goal below 6.5%.  She will return for follow-up in the future in 3 months or call earlier if necessary. Memory Compensation Strategies  Use "WARM" strategy.  W= write it down  A= associate it  R= repeat it  M= make a mental note  2.   You can keep a Social worker.  Use a 3-ring notebook with sections for the following: calendar, important names and phone numbers,  medications, doctors' names/phone numbers, lists/reminders, and a section to journal what you did  each day.   3.    Use a calendar to write appointments down.  4.    Write yourself a schedule for the day.  This can be placed on the calendar or in a separate section of the Memory Notebook.  Keeping a  regular schedule can help memory.  5.    Use medication organizer with sections for each day or morning/evening pills.  You may need help loading it  6.    Keep a basket, or pegboard by the door.  Place items that you need to take out with you in the basket or on the pegboard.  You may also want to  include a message board for reminders.  7.    Use sticky notes.  Place sticky notes with reminders in  a place where the task is performed.  For example: " turn off the  stove" placed by the stove, "lock the door" placed on the door at eye level, " take your medications" on  the bathroom mirror or by the place where you normally take your medications.  8.    Use alarms/timers.  Use while cooking to remind yourself to check on food or as a reminder to take your medicine, or as a  reminder to make a call, or as a reminder to perform another task, etc.

## 2021-07-24 ENCOUNTER — Other Ambulatory Visit: Payer: Self-pay | Admitting: Family Medicine

## 2021-07-24 DIAGNOSIS — Z13820 Encounter for screening for osteoporosis: Secondary | ICD-10-CM

## 2021-07-24 DIAGNOSIS — Z1231 Encounter for screening mammogram for malignant neoplasm of breast: Secondary | ICD-10-CM

## 2021-07-28 ENCOUNTER — Other Ambulatory Visit: Payer: Self-pay | Admitting: Cardiology

## 2021-07-29 ENCOUNTER — Telehealth: Payer: Self-pay

## 2021-07-29 LAB — DEMENTIA PANEL
Homocysteine: 14.4 umol/L (ref 0.0–17.2)
RPR Ser Ql: NONREACTIVE
TSH: 1.31 u[IU]/mL (ref 0.450–4.500)
Vitamin B-12: 227 pg/mL — ABNORMAL LOW (ref 232–1245)

## 2021-07-29 NOTE — Telephone Encounter (Signed)
Left message on patient's voicemail regarding results and left instructions to take Vitamin B12 1000 mcg pills. Informed patient levels would be rechecked at next visit. Asked patient to give the office a call back with any questions or concerns.

## 2021-07-29 NOTE — Telephone Encounter (Signed)
-----   Message from Wyvonnia Lora, RN sent at 07/29/2021 11:27 AM EST -----  ----- Message ----- From: Britt Bottom, MD Sent: 07/29/2021  11:12 AM EST To: Gna-[facility] 1 Results  Vitamin B12 was low normal.  She should take 1000 mcg pills over-the-counter and have it rechecked at the next visit

## 2021-07-30 ENCOUNTER — Ambulatory Visit: Payer: Medicare HMO | Admitting: Neurology

## 2021-07-30 DIAGNOSIS — R410 Disorientation, unspecified: Secondary | ICD-10-CM | POA: Diagnosis not present

## 2021-07-30 DIAGNOSIS — R413 Other amnesia: Secondary | ICD-10-CM

## 2021-08-03 ENCOUNTER — Telehealth: Payer: Self-pay | Admitting: *Deleted

## 2021-08-03 NOTE — Telephone Encounter (Signed)
-----   Message from Garvin Fila, MD sent at 08/03/2021  9:11 AM EST ----- Kindly inform the patient that EEG study was normal ----- Message ----- From: Garvin Fila, MD Sent: 08/03/2021   8:54 AM EST To: Garvin Fila, MD

## 2021-08-03 NOTE — Telephone Encounter (Signed)
Left patient a detailed message, with results, on voicemail (ok per DPR).  Provided our number to call back with any questions.  

## 2021-08-05 ENCOUNTER — Other Ambulatory Visit: Payer: Self-pay | Admitting: Cardiology

## 2021-08-06 NOTE — Telephone Encounter (Signed)
Pt's daughter, Tilda Franco (on [place]) would like a call from the nurse to review pt's EEG results

## 2021-08-06 NOTE — Telephone Encounter (Signed)
Spoke to pt and daughter, relayed message from MD Explained to daughter that pt should taper her Topamax Take 3 tablets (75 mg total) by mouth 2 (two) times daily. Reduce to two tablets twice daily x 1 week then 1 tablet twice daily x 1 week then 1 tablet daily x 1 week and stoop        MRI has not been schedule yet.

## 2021-08-06 NOTE — Telephone Encounter (Signed)
[facility] has tried to reach out to the patient to schedule twice.   07/27/21 2nd attempt [facility]NA EPIC [facility] 07/24/21 1st attempt [facility]mom gna epic dcp.  I will send the order again.

## 2021-08-21 ENCOUNTER — Other Ambulatory Visit: Payer: Self-pay | Admitting: Cardiology

## 2021-08-21 DIAGNOSIS — E785 Hyperlipidemia, unspecified: Secondary | ICD-10-CM

## 2021-08-22 ENCOUNTER — Other Ambulatory Visit: Payer: Self-pay | Admitting: Neurology

## 2021-08-24 ENCOUNTER — Other Ambulatory Visit: Payer: Self-pay | Admitting: Cardiology

## 2021-08-28 ENCOUNTER — Ambulatory Visit
Admission: RE | Admit: 2021-08-28 | Discharge: 2021-08-28 | Disposition: A | Discharge status: Home / Self Care | Payer: Medicare HMO | Source: Ambulatory Visit | Attending: Neurology | Admitting: Neurology

## 2021-08-28 ENCOUNTER — Other Ambulatory Visit: Payer: Self-pay

## 2021-08-28 DIAGNOSIS — R413 Other amnesia: Secondary | ICD-10-CM | POA: Diagnosis not present

## 2021-08-28 MED ORDER — GADOBENATE DIMEGLUMINE 529 MG/ML IV SOLN
13.0000 mL | Freq: Once | INTRAVENOUS | Status: AC | PRN
  Administered 2021-08-28: 13 mL via INTRAVENOUS

## 2021-09-01 ENCOUNTER — Telehealth: Payer: Self-pay

## 2021-09-01 NOTE — Progress Notes (Signed)
Kindly inform the patient that MR I scan of the brain shows expected evolutionary changes in her recent strokes.  No new or worrisome findings.

## 2021-09-01 NOTE — Telephone Encounter (Signed)
-----   Message from Garvin Fila, MD sent at 09/01/2021  4:32 PM EST ----- Kindly inform the patient that MR I scan of the brain shows expected evolutionary changes in her recent strokes.  No new or worrisome findings.

## 2021-09-01 NOTE — Telephone Encounter (Signed)
I called patient, spoke with patient's daughter, [name], per DPR.  I advised her of patient's MRI brain results.  [name] is worried about patient's memory and she wants to discuss the MRI results further with Dr. Leonie Man. They want to know if there is anything on the MRI that could contribute to her memory loss symptoms. She wants to know if there are medications Dr. Leonie Man can recommend for memory.  [name] [name] would like a call at [phone].

## 2021-09-14 ENCOUNTER — Other Ambulatory Visit: Payer: Self-pay | Admitting: Cardiology

## 2021-09-14 ENCOUNTER — Telehealth: Payer: Self-pay | Admitting: Cardiology

## 2021-09-14 NOTE — Telephone Encounter (Signed)
°  Pt c/o medication issue:  1. Name of Medication: Alirocumab ([place]) 75 MG/ML SOAJ  2. How are you currently taking this medication (dosage and times per day)? INJECT CONTENTS OF 1 PEN INTO THE SKIN EVERY 14 DAYS  3. Are you having a reaction (difficulty breathing--STAT)?   4. What is your medication issue? Pt's daughter calling, they need help to re-enroll pt for pt assistance for this meds

## 2021-09-15 ENCOUNTER — Other Ambulatory Visit: Payer: Self-pay | Admitting: Cardiology

## 2021-09-15 NOTE — Telephone Encounter (Signed)
Called and provided hwf info to daughter and they voiced understanding

## 2021-09-15 NOTE — Telephone Encounter (Signed)
[facility] approved. Will call pt after 8am this morning. PATIENT Samantha Huffman   STATUS  Active   START DATE 08/16/2021   END DATE 08/15/2022   ASSISTANCE TYPE Co-pay   PAID $0.00   PENDING $0.00   BALANCE $2500.00 Pharmacy Card CARD NO. [id number]   CARD STATUS Active   BIN [id number]   PCN [id number]   PC GROUP [id number]   HELP DESK [phone]   PROVIDER [id number]   PROCESSOR PDMI

## 2021-09-17 ENCOUNTER — Other Ambulatory Visit: Payer: Self-pay | Admitting: Cardiology

## 2021-09-17 DIAGNOSIS — I10 Essential (primary) hypertension: Secondary | ICD-10-CM

## 2021-09-18 ENCOUNTER — Other Ambulatory Visit: Payer: Self-pay | Admitting: *Deleted

## 2021-09-18 ENCOUNTER — Telehealth: Payer: Self-pay | Admitting: Cardiology

## 2021-09-18 DIAGNOSIS — I10 Essential (primary) hypertension: Secondary | ICD-10-CM

## 2021-09-18 MED ORDER — ISOSORBIDE MONONITRATE ER 30 MG PO TB24: 15.0000 mg | ORAL_TABLET | Freq: Every day | ORAL | 0 refills | Status: DC

## 2021-09-18 MED ORDER — CARVEDILOL 3.125 MG PO TABS: 3.1250 mg | ORAL_TABLET | Freq: Every day | ORAL | 0 refills | Status: DC

## 2021-09-18 MED ORDER — LISINOPRIL 20 MG PO TABS: 20.0000 mg | ORAL_TABLET | Freq: Two times a day (BID) | ORAL | 0 refills | Status: DC

## 2021-09-18 NOTE — Telephone Encounter (Signed)
°*  STAT* If patient is at the pharmacy, call can be transferred to refill team.   1. Which medications need to be refilled? (please list name of each medication and dose if known)  lisinopril (ZESTRIL) 20 MG tablet  20 mg twice daily carvedilol (COREG) 3.125 MG tablet 2 tablets (6.5 mg) daily isosorbide mononitrate (IMDUR) 30 MG 24 hr tablet as directed   2. Which pharmacy/location (including street and city if local pharmacy) is medication to be sent to? [id number] - [place], [place] [address]  3. Do they need a 30 day or 90 day supply? 30 days Patient's PCP changed dosage of lisinopril (ZESTRIL) 20 MG tablet to 20 mg twice daily and carvedilol (COREG) 3.125 MG tablet  to 2 tablets (6.5 mg) daily which is causing the medication to run out faster   Patient is leaving to go out of town Sunday. Patient will be home for her scheduled appt 10/07/21

## 2021-10-05 NOTE — Progress Notes (Signed)
PATIENT: Samantha Huffman DOB: April 17, 1956  REASON FOR VISIT: Follow up for memory, stroke, seizures HISTORY FROM: Patient and daughter  PRIMARY NEUROLOGIST: Dr. Leonie Man   HISTORY OF PRESENT ILLNESS: Today 10/06/21 Samantha Huffman is here today for follow-up, seen by Dr. Leonie Man in November 2022 for memory.  In May 2022 she had a right superior cerebellar, right PICA, left thalamic infarcts. Stopped Keppra after daughters claimed she didn't have active rx on file. Last seizure was in 2017, with seizure, starts with headache, then stares off, then stares off. Tapered off Topamax.  B12 was slightly low at 227.  EEG was normal.  MRI of the brain showed expected evolutionary changes, no new findings.  Is on aspirin and Plavix. MMSE 25/30 today. Since the stroke, right sided weakness, using cane or walker, feels speech speed increases when can't get the words out. Living alone, isn't driving. Memory is so-so. Has given up reading due to memory. No significant headaches. Forgets where she puts things. Has been living alone since end of last year, daughters feel she has done well. Does her own ADLs, keeps the house up. Is on B 12 supplement.   HISTORY  HPI: She returns for follow-up after last visit with Dr. Jannifer Franklin on 03/09/2021.  She is accompanied by her grandson.  Patient's main complaint today is memory difficulties which she has had for 10 years she feels that it is not progressive but family feels that it is bothersome.  She has trouble remembering recent information and making new memories.  She struggles with new information.  She lives alone and still cooks for herself and washes and does her dishes and uses the bathroom.  She has stopped driving.  She has not had any evaluation for reversible causes of memory loss or tried any memory medications.  There is no family history of Alzheimer's or dementia.  Patient has no recent symptoms of stroke or TIA.  In May 2022 she had right superior cerebellar, right PICA and  left thalamic infarcts.  She is currently on Topamax which she had been taking for migraine but has not had a migraine for several years now.  She is willing to reduce and stop it.  She feels she still has some residual speech difficulties from a stroke in May she has sometimes trouble finding words and speaking.  She is still dragging the right side as well and balance is also slightly off and not back to baseline.  She remains on Keppra for seizures and has not had any breakthrough seizures.  She remains on aspirin and Plavix or stroke prevention as well as on Lipitor and Praluent injections for lipids.  Blood pressure well controlled today it is 135/80  REVIEW OF SYSTEMS: Out of a complete 14 system review of symptoms, the patient complains only of the following symptoms, and all other reviewed systems are negative.  See HPI  ALLERGIES: Allergies  Allergen Reactions   Depakote [Divalproex Sodium] Other (See Comments)    Hyperammonemia   Other Other (See Comments)   Penicillins Hives, Other (See Comments) and Rash    Other reaction(s): GI intolerance Has patient had a PCN reaction causing immediate rash, facial/tongue/throat swelling, SOB or lightheadedness with hypotension: YES Has patient had a PCN reaction causing severe rash involving mucus membranes or skin necrosis: NO Has patient had a PCN reaction that required hospitalizationNO Has patient had a PCN reaction occurring within the last 10 years: NO If all of the above answers are "NO", then  may proceed with Cephalosporin use. Other reaction(s): GI Upset (intolerance) Other reaction(s): GI intolerance Has patient had a PCN reaction causing immediate rash, facial/tongue/throat swelling, SOB or lightheadedness with hypotension: YES Has patient had a PCN reaction causing severe rash involving mucus membranes or skin necrosis: NO Has patient had a PCN reaction that required hospitalizationNO Has patient had a PCN reaction occurring within  the last 10 years: NO If all of the above answers are "NO", then may proceed with Cephalosporin use.    HOME MEDICATIONS: Outpatient Medications Prior to Visit  Medication Sig Dispense Refill   acetaminophen (TYLENOL) 325 MG tablet Take 1-2 tablets (325-650 mg total) by mouth every 4 (four) hours as needed for mild pain.     Alirocumab (PRALUENT) 75 MG/ML SOAJ INJECT CONTENTS OF 1 PEN INTO THE SKIN EVERY 14 DAYS 6 mL 0   aspirin EC 325 MG tablet Take 1 tablet (325 mg total) by mouth daily. NEED TO PURCHASE THIS OVER THE COUNTER 360 tablet 0   atorvastatin (LIPITOR) 80 MG tablet Take 1 tablet (80 mg total) by mouth daily. Please keep upcoming appt in February 2023 with Dr. Radford Pax before anymore refills. Thank you 90 tablet 0   carvedilol (COREG) 3.125 MG tablet Take 1 tablet (3.125 mg total) by mouth daily. 30 tablet 0   clopidogrel (PLAVIX) 75 MG tablet TAKE 1 TABLET BY MOUTH EVERY DAY 90 tablet 0   docusate sodium (COLACE) 100 MG capsule Take 1 capsule (100 mg total) by mouth daily. PURCHASE THIS OVER THE COUNTER 30 capsule 0   escitalopram (LEXAPRO) 10 MG tablet Take 1 tablet (10 mg total) by mouth at bedtime. 30 tablet 0   isosorbide mononitrate (IMDUR) 30 MG 24 hr tablet Take 0.5 tablets (15 mg total) by mouth daily. Please make yearly appt with Dr. Radford Pax for December 2022 for future refills. Thank you 1st attempt 45 tablet 0   lisinopril (ZESTRIL) 20 MG tablet Take 1 tablet (20 mg total) by mouth 2 (two) times daily. 60 tablet 0   metFORMIN (GLUCOPHAGE-XR) 500 MG 24 hr tablet Take 1 tablet (500 mg total) by mouth daily with breakfast. 30 tablet 0   nitroGLYCERIN (NITROSTAT) 0.4 MG SL tablet Place 1 tablet (0.4 mg total) under the tongue every 5 (five) minutes x 3 doses as needed for chest pain. 25 tablet 4   ezetimibe (ZETIA) 10 MG tablet Take 1 tablet (10 mg total) by mouth daily. Please keep upcoming appt.in Feb. with Dr. Radford Pax to receive further refills. Thank You. 90 tablet 0    levETIRAcetam (KEPPRA) 500 MG tablet Take 1 tablet (500 mg total) by mouth 2 (two) times daily. 60 tablet 0   Nicotine 21-14-7 MG/24HR KIT Use as directed 1 kit 0   pantoprazole (PROTONIX) 40 MG tablet TAKE 1 TABLET BY MOUTH EVERY DAY 30 tablet 0   polyethylene glycol (MIRALAX / GLYCOLAX) 17 g packet Take 17 g by mouth 2 (two) times daily. PURCHASE THIS OVER THE COUNTER 60 each 0   topiramate (TOPAMAX) 25 MG tablet Take 3 tablets (75 mg total) by mouth 2 (two) times daily. Reduce to two tablets twice daily x 1 week then 1 tablet twice daily x 1 week then 1 tablet daily x 1 week and stoop 60 tablet 0   No facility-administered medications prior to visit.    PAST MEDICAL HISTORY: Past Medical History:  Diagnosis Date   Carotid artery stenosis    1-39% bilateral stenosis by dopplers 08/2020   Coronary  artery disease    a. s/p CABG 10/2012.  cath 05/2016 with moderate LM stenosis, mild LCx and RCA stenosis and occluded LAD with patent LIMA to LAD, free radial to OM.  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   Dyslipidemia    Family history of adverse reaction to anesthesia    mother and sister ponv   Generalized convulsive epilepsy without mention of intractable epilepsy    GERD (gastroesophageal reflux disease)    History of kidney stones    Hypertension    Memory deficit    slight   Migraine    "controlled on daily RX " (06/11/2016)   Nephrolithiasis 07/30/2015   S/p lithotripsy x 3 and right and left ureter stents   OSA (obstructive sleep apnea)    cpap    PONV (postoperative nausea and vomiting)    Seizures ([place])    "controlled w/daily RX; started in 2012; dr thinks they might be from the migraines; last sz was early part of 2016" (06/11/2016)   Type II diabetes mellitus ([place])    metformin   Vasovagal syncope 07/30/2015   2011    PAST SURGICAL HISTORY: Past Surgical History:  Procedure Laterality Date   CARDIAC CATHETERIZATION  2014; 06/11/2016   CARDIAC CATHETERIZATION N/A  06/11/2016   Procedure: Left Heart Cath and Cors/Grafts Angiography;  Surgeon: Sherren Mocha, MD;  Location: [facility];  Service: Cardiovascular;  Laterality: N/A;   CESAREAN SECTION     CORONARY ANGIOPLASTY     CORONARY ARTERY BYPASS GRAFT  March, 2014   LIMA to LAD, left radial to LCx x 2   CYSTOSCOPY W/ URETERAL STENT PLACEMENT Left 01/16/2019   Procedure: CYSTOSCOPY WITH RETROGRADE PYELOGRAM/URETERAL LEFT STENT PLACEMENT;  Surgeon: Ardis Hughs, MD;  Location: [facility];  Service: Urology;  Laterality: Left;   CYSTOSCOPY/URETEROSCOPY/HOLMIUM LASER/STENT PLACEMENT Left 01/24/2019   Procedure: CYSTOSCOPY, LEFT URETEROSCOPY, HOLMIUM LASER, STONE REMOVAL AND STENT EXCHANGE;  Surgeon: Ardis Hughs, MD;  Location: [facility];  Service: Urology;  Laterality: Left;   LITHOTRIPSY  X 3   RIGHT/LEFT HEART CATH AND CORONARY/GRAFT ANGIOGRAPHY N/A 09/08/2018   Procedure: RIGHT/LEFT HEART CATH AND CORONARY/GRAFT ANGIOGRAPHY;  Surgeon: Martinique, Peter M, MD;  Location: [facility];  Service: Cardiovascular;  Laterality: N/A;   TOTAL ABDOMINAL HYSTERECTOMY     ovaries took before hysterectomy   TUBAL LIGATION     URETERAL STENT PLACEMENT      FAMILY HISTORY: Family History  Problem Relation Age of Onset   Cancer Father    Diabetes Father    Diabetes Brother    Stroke Brother    Diabetes Brother    Heart disease Brother        CAD with CABG   Heart disease Sister        CAD with MI   Heart attack Sister     SOCIAL HISTORY: Social History   Socioeconomic History   Marital status: Divorced    Spouse name: Not on file   Number of children: 2   Years of education: 12   Highest education level: Not on file  Occupational History   Occupation: retired  Tobacco Use   Smoking status: Some Days    Packs/day: 0.33    Years: 35.00    Pack years: 11.55    Types: Cigarettes    Last attempt to quit: 06/11/2016    Years since quitting: 5.3   Smokeless tobacco: Former     Types: Snuff, Loss adjuster, chartered  Quit date: 11/20/2012  Vaping Use   Vaping Use: Never used  Substance and Sexual Activity   Alcohol use: Yes    Comment: 06/11/2016 "might have a few drinks/month; usually wine"   Drug use: No   Sexual activity: Not Currently  Other Topics Concern   Not on file  Social History Narrative   Patient drinks about 1 cup of coffee daily.   Patient is right handed.    Social Determinants of Health   Financial Resource Strain: Not on file  Food Insecurity: Not on file  Transportation Needs: Not on file  Physical Activity: Not on file  Stress: Not on file  Social Connections: Not on file  Intimate Partner Violence: Not on file   PHYSICAL EXAM  Vitals:   10/06/21 0921  BP: (!) 168/83  Pulse: 74  Weight: 151 lb 6.4 oz (68.7 kg)  Height: '5\' 1"'  (1.549 m)   Body mass index is 28.61 kg/m.  Generalized: Well developed, in no acute distress  MMSE - Mini Mental State Exam 10/06/2021 07/22/2021 04/03/2020  Orientation to time '5 4 5  ' Orientation to Place '5 5 5  ' Registration '3 3 3  ' Attention/ Calculation 0 0 5  Recall '3 3 3  ' Language- name 2 objects '2 2 2  ' Language- repeat '1 1 1  ' Language- follow 3 step command '3 3 3  ' Language- read & follow direction '1 1 1  ' Write a sentence '1 1 1  ' Copy design 1 0 1  Copy design-comments - - -  Total score '25 23 30   ' Neurological examination  Mentation: Alert oriented to time, place, history taking. Follows all commands speech and language fluent, no slurred speech. Very pleasant, participatory. Cranial nerve II-XII: Pupils were equal round reactive to light. Extraocular movements were full, visual field were full on confrontational test (Dr. Leonie Man noted dysmetria on left lateral gaze). Facial sensation and strength were normal. Head turning and shoulder shrug  were normal and symmetric. Motor: Good strength all extremities, no significant weakness noted. Sensory: Sensory testing is intact to soft touch on all 4 extremities. No  evidence of extinction is noted.  Coordination: Ataxia with right finger-nose-finger Gait and station: Gait slightly wide-based, limp on the right, can walk independently short distances Reflexes: Deep tendon reflexes are symmetric but exaggerated on the right  DIAGNOSTIC DATA (LABS, IMAGING, TESTING) - I reviewed patient records, labs, notes, testing and imaging myself where available.  Lab Results  Component Value Date   WBC 5.8 02/09/2021   HGB 12.7 02/09/2021   HCT 37.9 02/09/2021   MCV 95.2 02/09/2021   PLT 182 02/09/2021      Component Value Date/Time   NA 139 02/09/2021 0554   NA 143 09/04/2018 1306   K 4.2 02/09/2021 0554   CL 108 02/09/2021 0554   CO2 22 02/09/2021 0554   GLUCOSE 138 (H) 02/09/2021 0554   BUN 28 (H) 02/09/2021 0554   BUN 26 09/04/2018 1306   CREATININE 1.13 (H) 02/09/2021 0554   CREATININE 0.87 08/18/2020 0955   CALCIUM 9.7 02/09/2021 0554   PROT 6.7 01/31/2021 0501   PROT 7.4 09/15/2015 1026   ALBUMIN 3.7 01/31/2021 0501   ALBUMIN 4.4 09/15/2015 1026   AST 30 01/31/2021 0501   ALT 30 01/31/2021 0501   ALKPHOS 100 01/31/2021 0501   BILITOT 0.6 01/31/2021 0501   BILITOT 0.3 09/15/2015 1026   GFRNONAA 54 (L) 02/09/2021 0554   GFRAA >60 01/09/2020 0405   Lab Results  Component Value Date   CHOL 123 01/21/2021   HDL 42 01/21/2021   LDLCALC 57 01/21/2021   LDLDIRECT 67 06/13/2018   TRIG 118 01/21/2021   CHOLHDL 2.9 01/21/2021   Lab Results  Component Value Date   HGBA1C 7.1 (H) 01/21/2021   Lab Results  Component Value Date   VITAMINB12 227 (L) 07/27/2021   Lab Results  Component Value Date   TSH 1.310 07/27/2021    ASSESSMENT AND PLAN 66 y.o. year old female   1.  Right cerebellar stroke, right PICA stroke, left thalamic stroke 2.  History of seizures 3.  Hypertension 4.  Diabetes 5.  Hyperlipidemia 6.  Post-stroke memory loss, cognitive impairment  -Loriel is overall doing fairly well, has gone back to living alone and  seems to have adjusted well to this -MRI of the brain in November 2022 showed no acute findings, it showed expected evolutionary changes from recent strokes -B12 was low at 227, on supplement, will recheck today (otherwise dementia panel was unremarkable) -EEG was normal in December 2022 -Will restart Keppra 500 mg twice daily for seizure prophylaxis with history of Epilepsy, will remain off Topamax, headaches are well controlled -Follow memory over time, MMSE 25/30 today -Currently, have recommended refraining from driving due to memory, stroke, residual right sided weakness, have discussed driver rehabilitation programs in the future -On aspirin and Plavix for stroke prevention -Needs to continue follow-up with PCP for management of vascular risk factors for secondary stroke prevention, BP goal < 130/90, LDL < 70, A1C 6.5, BP medication adjustments have been made  -Cardiac monitor study in August 2022 showed no evidence of AFIB -Follow-up in 6 months or sooner if needed  Evangeline Dakin, DNP 10/06/2021, 9:42 AM [facility] [address], [place], [place] [address] [phone]

## 2021-10-06 ENCOUNTER — Encounter: Payer: Self-pay | Admitting: Neurology

## 2021-10-06 ENCOUNTER — Other Ambulatory Visit: Payer: Self-pay

## 2021-10-06 ENCOUNTER — Ambulatory Visit (INDEPENDENT_AMBULATORY_CARE_PROVIDER_SITE_OTHER): Payer: Medicare HMO | Admitting: Neurology

## 2021-10-06 VITALS — BP 168/83 | HR 74 | Ht 61.0 in | Wt 151.4 lb

## 2021-10-06 DIAGNOSIS — G40309 Generalized idiopathic epilepsy and epileptic syndromes, not intractable, without status epilepticus: Secondary | ICD-10-CM | POA: Diagnosis not present

## 2021-10-06 DIAGNOSIS — I639 Cerebral infarction, unspecified: Secondary | ICD-10-CM | POA: Diagnosis not present

## 2021-10-06 DIAGNOSIS — R413 Other amnesia: Secondary | ICD-10-CM

## 2021-10-06 MED ORDER — LEVETIRACETAM 500 MG PO TABS: 500.0000 mg | ORAL_TABLET | Freq: Two times a day (BID) | ORAL | 3 refills | Status: DC

## 2021-10-06 NOTE — Progress Notes (Signed)
Date:  10/07/2021   ID:  Samantha Huffman, DOB December 10, 1955, MRN [id number]  PCP:  Collene Leyden, MD  Cardiologist:  Fransico Him, MD  Electrophysiologist:  None   Chief Complaint:  CAD, Carotid stenosis, HLD, HTN  History of Present Illness:    Samantha Huffman is a 66 y.o. female with a history of CAD status post CABG 10/2012 in [place], abnormal stress test 2017 but cath showed patent grafts to the OM and LAD and 30% RCA normal LV function. Patient has had periodic chest pain and was placed on Imdur for possible microvascular angina. PCP was treating her for anxiety. 2D echo 07/2018 EF 60 to 65% with grade 1 DD.   Patient underwent cardiac catheterization 09/04/2018 due to ongoing CP and found to have patent LIMA to the LAD, patent SVGs and radial graft to the OM, normal LV function, low LV filling pressures. Chlorthalidone and Lasix were held because of low filling pressures and blood pressure otherwise medical therapy continued. She also has HLD, HTN, DM2, OSA on CPAP and carotid artery dz as well as tobacco abuse.  She is here today for followup.  She had a CVA in May 22 and has residual problems with speech.  She denies any chest pain or pressure, SOB, DOE, PND, orthopnea, LE edema, dizziness, palpitations or syncope.  She is compliant with her meds and is tolerating meds with no SE.     She has not been using her CPAP since October because she has not been able to get a new mask that fits.  She is very frustrated with not being able to get a new mask.   Prior CV studies:   The following studies were reviewed today:  none  Past Medical History:  Diagnosis Date   Carotid artery stenosis    1-39% bilateral stenosis by dopplers 08/2020   Coronary artery disease    a. s/p CABG 10/2012.  cath 05/2016 with moderate LM stenosis, mild LCx and RCA stenosis and occluded LAD with patent LIMA to LAD, free radial to OM.  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   Dyslipidemia    Family history  of adverse reaction to anesthesia    mother and sister ponv   Generalized convulsive epilepsy without mention of intractable epilepsy    GERD (gastroesophageal reflux disease)    History of kidney stones    Hypertension    Memory deficit    slight   Migraine    "controlled on daily RX " (06/11/2016)   Nephrolithiasis 07/30/2015   S/p lithotripsy x 3 and right and left ureter stents   OSA (obstructive sleep apnea)    cpap    PONV (postoperative nausea and vomiting)    Seizures ([place])    "controlled w/daily RX; started in 2012; dr thinks they might be from the migraines; last sz was early part of 2016" (06/11/2016)   Type II diabetes mellitus ([place])    metformin   Vasovagal syncope 07/30/2015   2011   Past Surgical History:  Procedure Laterality Date   CARDIAC CATHETERIZATION  2014; 06/11/2016   CARDIAC CATHETERIZATION N/A 06/11/2016   Procedure: Left Heart Cath and Cors/Grafts Angiography;  Surgeon: Samantha Mocha, MD;  Location: [facility];  Service: Cardiovascular;  Laterality: N/A;   CESAREAN SECTION     CORONARY ANGIOPLASTY     CORONARY ARTERY BYPASS GRAFT  March, 2014   LIMA to LAD, left radial to LCx x 2   CYSTOSCOPY W/  URETERAL STENT PLACEMENT Left 01/16/2019   Procedure: CYSTOSCOPY WITH RETROGRADE PYELOGRAM/URETERAL LEFT STENT PLACEMENT;  Surgeon: Ardis Hughs, MD;  [facility];  Service: Urology;  Laterality: Left;   CYSTOSCOPY/URETEROSCOPY/HOLMIUM LASER/STENT PLACEMENT Left 01/24/2019   Procedure: CYSTOSCOPY, LEFT URETEROSCOPY, HOLMIUM LASER, STONE REMOVAL AND STENT EXCHANGE;  Surgeon: Ardis Hughs, MD;  [facility];  Service: Urology;  Laterality: Left;   LITHOTRIPSY  X 3   RIGHT/LEFT HEART CATH AND CORONARY/GRAFT ANGIOGRAPHY N/A 09/08/2018   Procedure: RIGHT/LEFT HEART CATH AND CORONARY/GRAFT ANGIOGRAPHY;  Surgeon: Martinique, Peter M, MD;  Location: [facility];  Service: Cardiovascular;  Laterality: N/A;   TOTAL ABDOMINAL HYSTERECTOMY      ovaries took before hysterectomy   TUBAL LIGATION     URETERAL STENT PLACEMENT       Current Meds  Medication Sig   acetaminophen (TYLENOL) 325 MG tablet Take 1-2 tablets (325-650 mg total) by mouth every 4 (four) hours as needed for mild pain.   Alirocumab (PRALUENT) 75 MG/ML SOAJ INJECT CONTENTS OF 1 PEN INTO THE SKIN EVERY 14 DAYS   aspirin EC 325 MG tablet Take 1 tablet (325 mg total) by mouth daily. NEED TO PURCHASE THIS OVER THE COUNTER   atorvastatin (LIPITOR) 80 MG tablet Take 1 tablet (80 mg total) by mouth daily. Please keep upcoming appt in February 2023 with Dr. Radford Pax before anymore refills. Thank you   carvedilol (COREG) 3.125 MG tablet Take 1 tablet (3.125 mg total) by mouth daily.   clopidogrel (PLAVIX) 75 MG tablet TAKE 1 TABLET BY MOUTH EVERY DAY   escitalopram (LEXAPRO) 10 MG tablet Take 1 tablet (10 mg total) by mouth at bedtime.   levETIRAcetam (KEPPRA) 500 MG tablet Take 1 tablet (500 mg total) by mouth 2 (two) times daily.   lisinopril (ZESTRIL) 20 MG tablet Take 1 tablet (20 mg total) by mouth 2 (two) times daily.   metFORMIN (GLUCOPHAGE-XR) 500 MG 24 hr tablet Take 1 tablet (500 mg total) by mouth daily with breakfast.   nitroGLYCERIN (NITROSTAT) 0.4 MG SL tablet Place 1 tablet (0.4 mg total) under the tongue every 5 (five) minutes x 3 doses as needed for chest pain.   vitamin B-12 (CYANOCOBALAMIN) 1000 MCG tablet Take 1,000 mcg by mouth daily.     Allergies:   Depakote [divalproex sodium], Other, and Penicillins   Social History   Tobacco Use   Smoking status: Some Days    Packs/day: 0.33    Years: 35.00    Pack years: 11.55    Types: Cigarettes    Last attempt to quit: 06/11/2016    Years since quitting: 5.3   Smokeless tobacco: Former    Types: Snuff, Chew    Quit date: 11/20/2012  Vaping Use   Vaping Use: Never used  Substance Use Topics   Alcohol use: Yes    Comment: 06/11/2016 "might have a few drinks/month; usually wine"   Drug use: No      Family Hx: The patient's family history includes Cancer in her father; Diabetes in her brother, brother, and father; Heart attack in her sister; Heart disease in her brother and sister; Stroke in her brother.  ROS:   Please see the history of present illness.     All other systems reviewed and are negative.   Labs/Other Tests and Data Reviewed:    Recent Labs: 01/25/2021: Magnesium 1.8 01/31/2021: ALT 30 02/09/2021: BUN 28; Creatinine, Ser 1.13; Hemoglobin 12.7; Platelets 182; Potassium 4.2; Sodium 139 07/27/2021: TSH  1.310   Recent Lipid Panel Lab Results  Component Value Date/Time   CHOL 123 01/21/2021 05:08 AM   CHOL 136 03/12/2019 10:43 AM   TRIG 118 01/21/2021 05:08 AM   HDL 42 01/21/2021 05:08 AM   HDL 46 03/12/2019 10:43 AM   CHOLHDL 2.9 01/21/2021 05:08 AM   LDLCALC 57 01/21/2021 05:08 AM   LDLCALC 44 08/18/2020 09:45 AM   LDLDIRECT 67 06/13/2018 03:17 PM    Wt Readings from Last 3 Encounters:  10/07/21 151 lb (68.5 kg)  10/06/21 151 lb 6.4 oz (68.7 kg)  07/22/21 146 lb 12.8 oz (66.6 kg)     Objective:    Vital Signs:  BP (!) 162/90    Pulse 72    Ht 5' 1.5" (1.562 Huffman)    Wt 151 lb (68.5 kg)    SpO2 97%    BMI 28.07 kg/Huffman   GEN: Well nourished, well developed in no acute distress HEENT: Normal NECK: No JVD; No carotid bruits LYMPHATICS: No lymphadenopathy CARDIAC:RRR, no murmurs, rubs, gallops RESPIRATORY:  Clear to auscultation without rales, wheezing or rhonchi  ABDOMEN: Soft, non-tender, non-distended MUSCULOSKELETAL:  No edema; No deformity  SKIN: Warm and dry NEUROLOGIC:  Alert and oriented x 3 PSYCHIATRIC:  Normal affect   ASSESSMENT & PLAN:    1. ASCAD  -CABG x2 in 2014 with LIMA to the LAD and free radial to the OM with recent cath 09/04/2018 with patent LIMA to the LAD and patent free radial graft to the OM.  -She has not had any chest pain and her shortness of breath is stable since I saw her last -Continue prescription drug management with  aspirin, atorvastatin 80 mg daily, Praluent, carvedilol 3.125 mg twice daily, Plavix 75 mg daily, Imdur 15 mg daily >>refilled today  2. HTN  -BP is  elevated on exam today as she ran out of Lisinopril -Continue prescription drug management with lisinopril 20 mg twice daily, Imdur 15 mg daily, carvedilol 3.125 mg twice daily >>refilled today for 1 year -check BMET   3. Hyperlipidemia  -LDL goal is < 70.  -continue Praluent, Atorvastatin 80mg  daily and Zetia 10mg  daily>>refilled today   4. Carotid artery stenosis  -dopplers 08/2020 showed 1-39% bilateral stenosis  -repeat dopplers 08/2022 -continue ASA and statin.    5. Type 2 DM  -this is followed by her PCP  -continue Metformin XR 500mg  daily    6.  OSA  -she has not been using her CPAP since October due to issues with her current mask -I will get an appt with her DME to get a new mask fitting and new mask   7.  SOB -this is chronic but since her CVA it only occurs with talking and not with any exertional activity -cath 08/2018 showed patent grafts and SOB has not changed since then -LVF is normal  Medication Adjustments/Labs and Tests Ordered: Current medicines are reviewed at length with the patient today.  Concerns regarding medicines are outlined above.  Tests Ordered: No orders of the defined types were placed in this encounter.  Medication Changes: No orders of the defined types were placed in this encounter.   Disposition:  Follow up in 1 year(s)  Signed, Fransico Him, MD  10/07/2021 9:53 AM    [facility]

## 2021-10-06 NOTE — Patient Instructions (Signed)
Restart the Keppra  Check the B 12 level  Recommend consistent exercise program  Would recommend against driving for now Continue to see primary care See you back in 6 months

## 2021-10-07 ENCOUNTER — Telehealth: Payer: Self-pay | Admitting: *Deleted

## 2021-10-07 ENCOUNTER — Ambulatory Visit: Payer: Medicare HMO | Admitting: Cardiology

## 2021-10-07 ENCOUNTER — Encounter: Payer: Self-pay | Admitting: Cardiology

## 2021-10-07 ENCOUNTER — Other Ambulatory Visit: Payer: Self-pay

## 2021-10-07 VITALS — BP 162/90 | HR 72 | Ht 61.5 in | Wt 151.0 lb

## 2021-10-07 DIAGNOSIS — E785 Hyperlipidemia, unspecified: Secondary | ICD-10-CM

## 2021-10-07 DIAGNOSIS — E119 Type 2 diabetes mellitus without complications: Secondary | ICD-10-CM

## 2021-10-07 DIAGNOSIS — I2510 Atherosclerotic heart disease of native coronary artery without angina pectoris: Secondary | ICD-10-CM

## 2021-10-07 DIAGNOSIS — I10 Essential (primary) hypertension: Secondary | ICD-10-CM | POA: Diagnosis not present

## 2021-10-07 DIAGNOSIS — G4733 Obstructive sleep apnea (adult) (pediatric): Secondary | ICD-10-CM

## 2021-10-07 DIAGNOSIS — I6523 Occlusion and stenosis of bilateral carotid arteries: Secondary | ICD-10-CM

## 2021-10-07 DIAGNOSIS — R0602 Shortness of breath: Secondary | ICD-10-CM

## 2021-10-07 LAB — BASIC METABOLIC PANEL
BUN/Creatinine Ratio: 15 (ref 12–28)
BUN: 12 mg/dL (ref 8–27)
CO2: 22 mmol/L (ref 20–29)
Calcium: 10.2 mg/dL (ref 8.7–10.3)
Chloride: 102 mmol/L (ref 96–106)
Creatinine, Ser: 0.79 mg/dL (ref 0.57–1.00)
Glucose: 158 mg/dL — ABNORMAL HIGH (ref 70–99)
Potassium: 4.3 mmol/L (ref 3.5–5.2)
Sodium: 137 mmol/L (ref 134–144)
eGFR: 83 mL/min/{1.73_m2} (ref >59)

## 2021-10-07 LAB — VITAMIN B12: Vitamin B-12: 814 pg/mL (ref 232–1245)

## 2021-10-07 MED ORDER — PRALUENT 75 MG/ML ~~LOC~~ SOAJ: SUBCUTANEOUS | 3 refills | Status: DC

## 2021-10-07 MED ORDER — EZETIMIBE 10 MG PO TABS: 10.0000 mg | ORAL_TABLET | Freq: Every day | ORAL | 3 refills | Status: DC

## 2021-10-07 MED ORDER — LISINOPRIL 20 MG PO TABS: 20.0000 mg | ORAL_TABLET | Freq: Two times a day (BID) | ORAL | 3 refills | Status: DC

## 2021-10-07 MED ORDER — CARVEDILOL 3.125 MG PO TABS: 3.1250 mg | ORAL_TABLET | Freq: Every day | ORAL | 3 refills | Status: DC

## 2021-10-07 MED ORDER — CLOPIDOGREL BISULFATE 75 MG PO TABS: 75.0000 mg | ORAL_TABLET | Freq: Every day | ORAL | 3 refills | Status: DC

## 2021-10-07 MED ORDER — NITROGLYCERIN 0.4 MG SL SUBL: 0.4000 mg | SUBLINGUAL_TABLET | SUBLINGUAL | 4 refills | Status: AC | PRN

## 2021-10-07 MED ORDER — ISOSORBIDE MONONITRATE ER 30 MG PO TB24: 15.0000 mg | ORAL_TABLET | Freq: Every day | ORAL | 3 refills | Status: DC

## 2021-10-07 MED ORDER — ATORVASTATIN CALCIUM 80 MG PO TABS: 80.0000 mg | ORAL_TABLET | Freq: Every day | ORAL | 3 refills | Status: DC

## 2021-10-07 NOTE — Addendum Note (Signed)
Addended by: Antonieta Iba on: 10/07/2021 10:17 AM   Modules accepted: Orders

## 2021-10-07 NOTE — Telephone Encounter (Signed)
I spoke to her daughter, Tilda Franco, and provided her with the lab results. She will have her mother continue the B12 supplement.

## 2021-10-07 NOTE — Telephone Encounter (Signed)
Per dr Radford Pax, get an appt with her DME to get a new mask fitting and new mask

## 2021-10-07 NOTE — Addendum Note (Signed)
Addended by: Antonieta Iba on: 10/07/2021 10:12 AM   Modules accepted: Orders

## 2021-10-07 NOTE — Telephone Encounter (Signed)
Order placed to [employer] via community message. 

## 2021-10-07 NOTE — Telephone Encounter (Signed)
-----   Message from Suzzanne Cloud, NP sent at 10/07/2021  5:57 AM EST ----- B12 is within normal range, continue supplement. Thanks!

## 2021-10-07 NOTE — Patient Instructions (Signed)
Medication Instructions:  Your physician has recommended you make the following change in your medication:  1) RESTART taking (ezetimibe) Zetia 10 mg daily  2) RESTART taking isosorbide mononitrate (Imdur)  15 mg daily *If you need a refill on your cardiac medications before your next appointment, please call your pharmacy*   Lab Work: TODAY: BMET IN 8 WEEKS: Fasting lipids and ALT If you have labs (blood work) drawn today and your tests are completely normal, you will receive your results only by: [place] (if you have MyChart) OR A paper copy in the mail If you have any lab test that is abnormal or we need to change your treatment, we will call you to review the results.   Testing/Procedures: Your physician has requested that you have a carotid duplex in January 2024. This test is an ultrasound of the carotid arteries in your neck. It looks at blood flow through these arteries that supply the brain with blood. Allow one hour for this exam. There are no restrictions or special instructions.  Follow-Up: At [facility], you and your health needs are our priority.  As part of our continuing mission to provide you with exceptional heart care, we have created designated Provider Care Teams.  These Care Teams include your primary Cardiologist (physician) and Advanced Practice Providers (APPs -  Physician Assistants and Nurse Practitioners) who all work together to provide you with the care you need, when you need it.  Your next appointment:   1 year(s)  The format for your next appointment:   In Person  Provider:   Fransico Him, MD

## 2021-11-29 ENCOUNTER — Other Ambulatory Visit: Payer: Self-pay | Admitting: Physical Medicine and Rehabilitation

## 2021-12-02 ENCOUNTER — Other Ambulatory Visit: Payer: Medicare HMO | Admitting: *Deleted

## 2021-12-02 DIAGNOSIS — I2510 Atherosclerotic heart disease of native coronary artery without angina pectoris: Secondary | ICD-10-CM

## 2021-12-02 DIAGNOSIS — I10 Essential (primary) hypertension: Secondary | ICD-10-CM

## 2021-12-02 DIAGNOSIS — I6523 Occlusion and stenosis of bilateral carotid arteries: Secondary | ICD-10-CM

## 2021-12-02 DIAGNOSIS — E119 Type 2 diabetes mellitus without complications: Secondary | ICD-10-CM

## 2021-12-02 DIAGNOSIS — R0602 Shortness of breath: Secondary | ICD-10-CM

## 2021-12-02 DIAGNOSIS — G4733 Obstructive sleep apnea (adult) (pediatric): Secondary | ICD-10-CM

## 2021-12-02 DIAGNOSIS — E785 Hyperlipidemia, unspecified: Secondary | ICD-10-CM

## 2021-12-02 LAB — ALT: ALT: 40 IU/L — ABNORMAL HIGH (ref 0–32)

## 2021-12-02 LAB — LIPID PANEL
Chol/HDL Ratio: 1.6 ratio (ref 0.0–4.4)
Cholesterol, Total: 66 mg/dL — ABNORMAL LOW (ref 100–199)
HDL: 42 mg/dL (ref >39)
LDL Chol Calc (NIH): 8 mg/dL (ref 0–99)
Triglycerides: 75 mg/dL (ref 0–149)
VLDL Cholesterol Cal: 16 mg/dL (ref 5–40)

## 2021-12-04 ENCOUNTER — Telehealth: Payer: Self-pay

## 2021-12-04 DIAGNOSIS — E785 Hyperlipidemia, unspecified: Secondary | ICD-10-CM

## 2021-12-04 NOTE — Telephone Encounter (Signed)
The patient's daughter has been notified of the result and verbalized understanding.  All questions (if any) were answered. ?Antonieta Iba, RN 12/04/2021 4:55 PM  ?Repeat labs have been scheduled.  ?

## 2021-12-04 NOTE — Telephone Encounter (Signed)
-----   Message from Sueanne Margarita, MD sent at 12/02/2021  7:51 PM EDT ----- ?LDL is very low at 8.  Stop Zetia and repeat FLP in 6 weeks ?

## 2021-12-11 NOTE — Addendum Note (Signed)
Addended by: Freada Bergeron on: 12/11/2021 10:13 AM ? ? Modules accepted: Orders ? ?

## 2021-12-16 ENCOUNTER — Ambulatory Visit
Admission: RE | Admit: 2021-12-16 | Discharge: 2021-12-16 | Disposition: A | Discharge status: Home / Self Care | Payer: Medicare HMO | Source: Ambulatory Visit | Attending: Family Medicine | Admitting: Family Medicine

## 2021-12-16 DIAGNOSIS — Z13820 Encounter for screening for osteoporosis: Secondary | ICD-10-CM

## 2021-12-16 DIAGNOSIS — Z1231 Encounter for screening mammogram for malignant neoplasm of breast: Secondary | ICD-10-CM

## 2021-12-16 IMAGING — MG MM DIGITAL SCREENING BILAT W/ TOMO AND CAD
8 series · 8 of 24 positions shown · non-contrast
Comparison: Previous exam(s).

CLINICAL DATA: Screening.

EXAM:
DIGITAL SCREENING BILATERAL MAMMOGRAM WITH TOMOSYNTHESIS AND CAD
TECHNIQUE: Bilateral screening digital craniocaudal and mediolateral oblique
mammograms were obtained. Bilateral screening digital breast
tomosynthesis was performed. The images were evaluated with
computer-aided detection.

[R CC synth-2D]
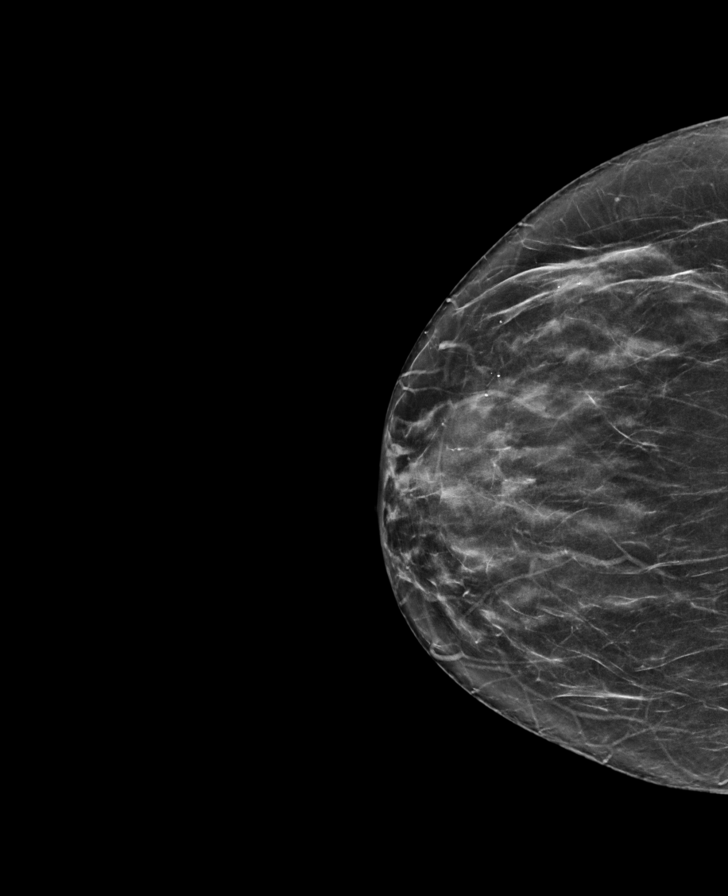

[L MLO synth-2D]
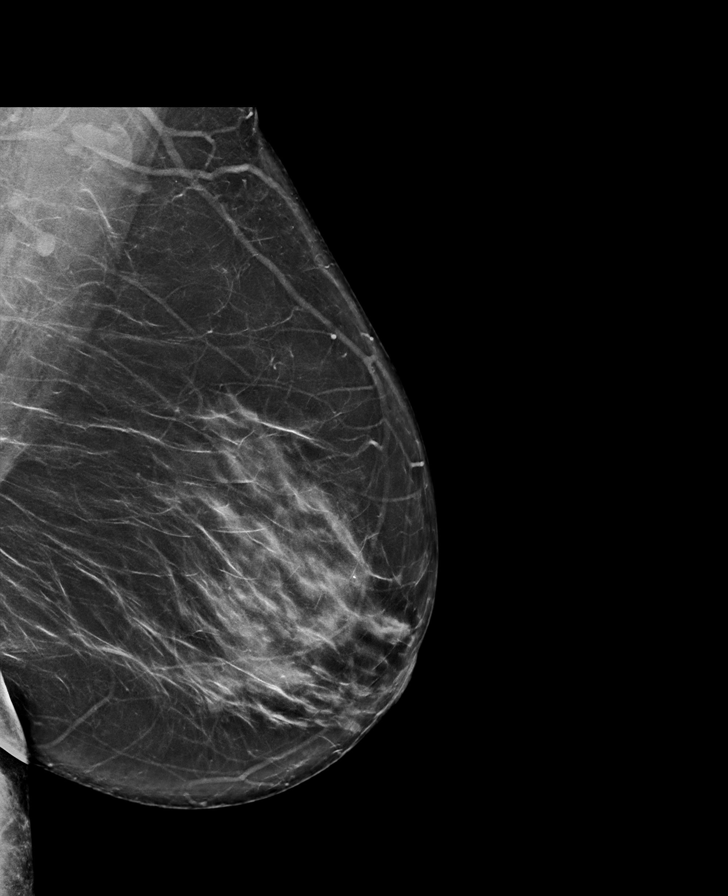

[R MLO synth-2D]
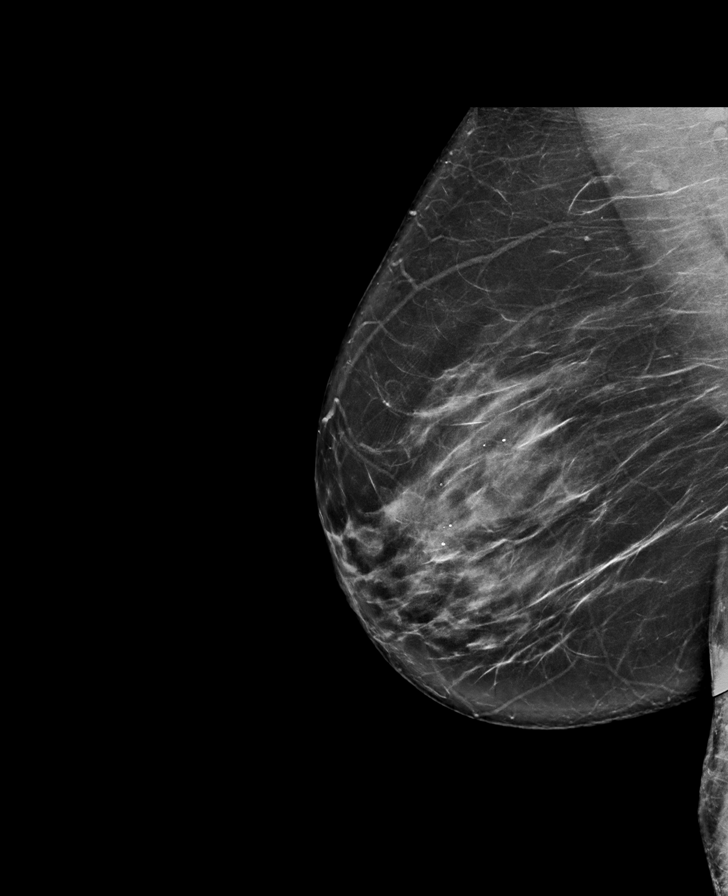

[L CC synth-2D]
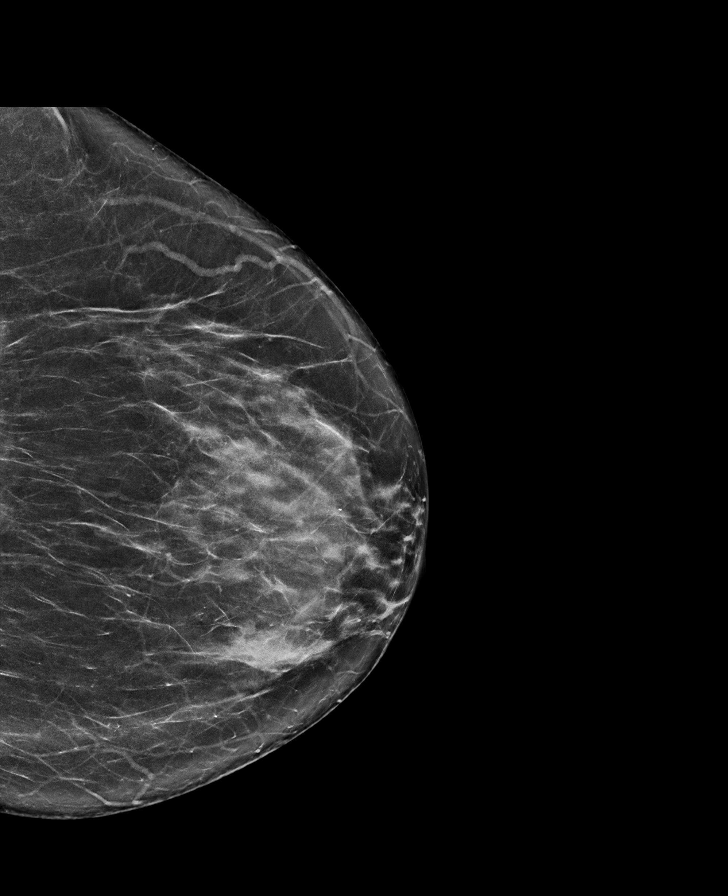

[R MLO tomo · tomo slice 41/82.0]
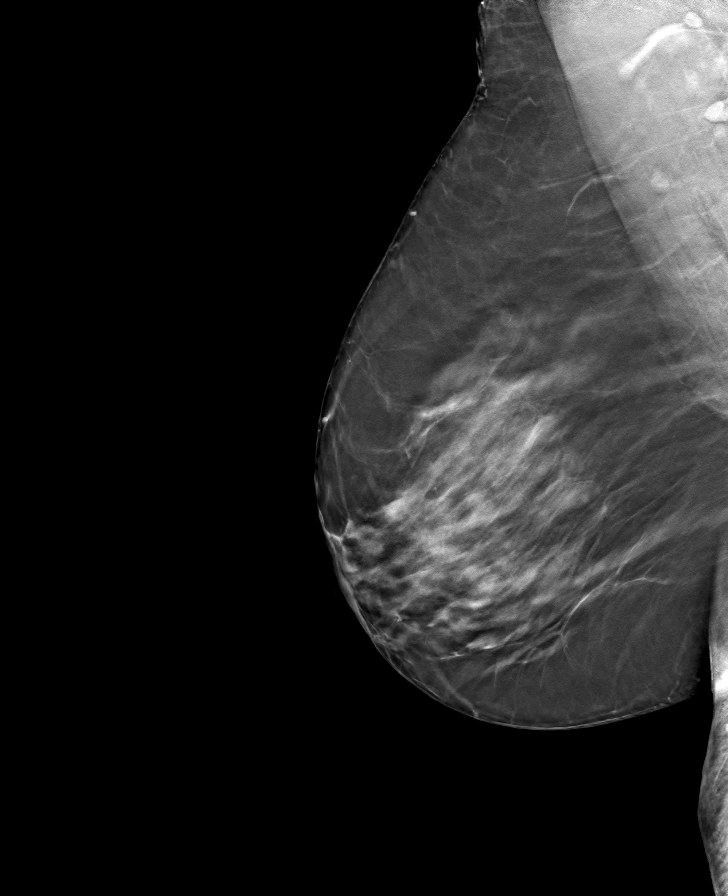

[L MLO tomo · tomo slice 42/83.0]
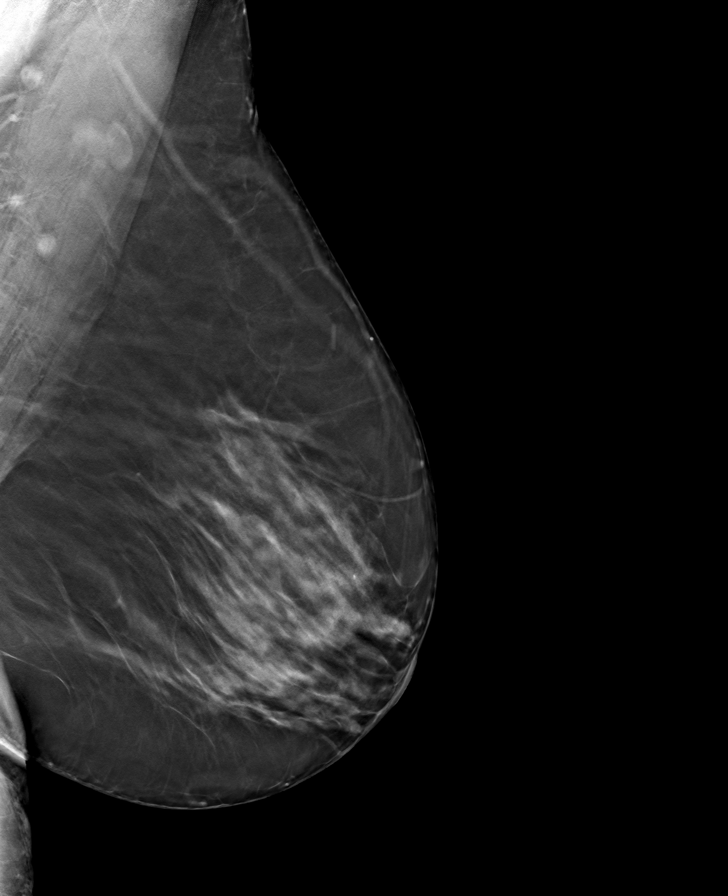

[L CC tomo · tomo slice 37/74.0]
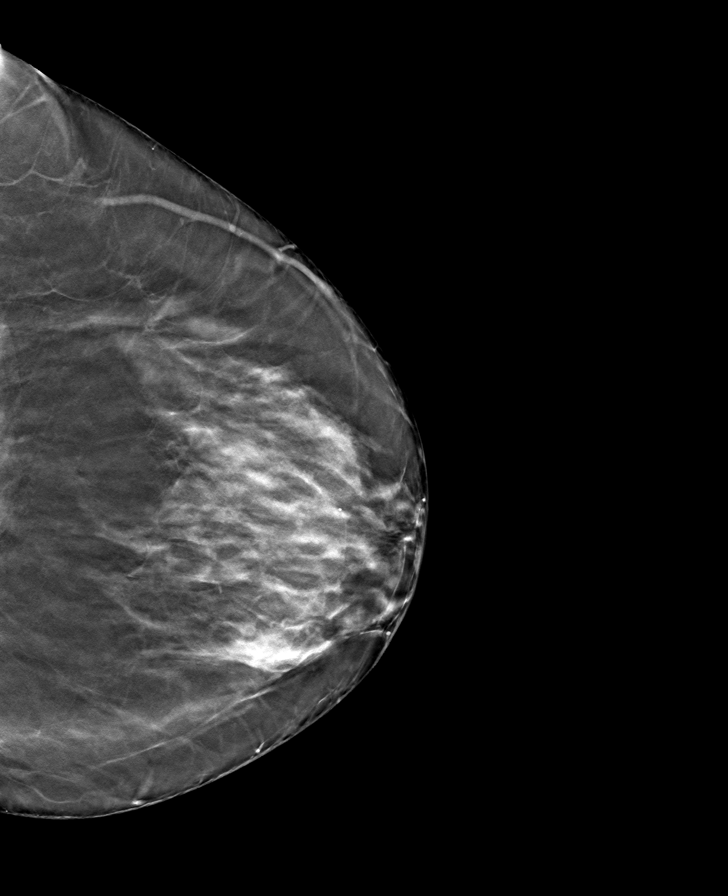

[R CC tomo · tomo slice 35/68.0]
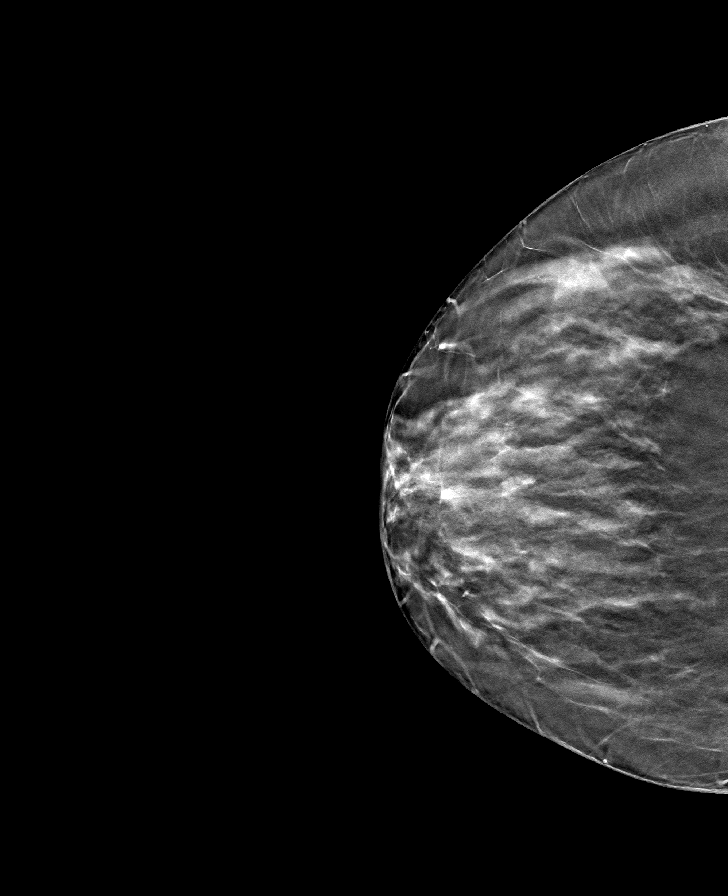

[8 of 24 positions shown; findings below may reference images not displayed]

ACR Breast Density Category c: The breast tissue is heterogeneously
dense, which may obscure small masses.
FINDINGS: There are no findings suspicious for malignancy.
IMPRESSION: No mammographic evidence of malignancy. A result letter of this
screening mammogram will be mailed directly to the patient.

RECOMMENDATION:
Screening mammogram in one year. (Code:[V2])

BI-RADS CATEGORY  1: Negative.

## 2021-12-21 ENCOUNTER — Ambulatory Visit: Payer: Medicare HMO | Admitting: Neurology

## 2021-12-21 VITALS — BP 147/73 | HR 71 | Ht 61.0 in | Wt 151.0 lb

## 2021-12-21 DIAGNOSIS — R413 Other amnesia: Secondary | ICD-10-CM

## 2021-12-21 DIAGNOSIS — Z8669 Personal history of other diseases of the nervous system and sense organs: Secondary | ICD-10-CM | POA: Diagnosis not present

## 2021-12-21 DIAGNOSIS — Z8673 Personal history of transient ischemic attack (TIA), and cerebral infarction without residual deficits: Secondary | ICD-10-CM

## 2021-12-21 NOTE — Progress Notes (Signed)
?[facility] ?[address] ?[place]. [place] [address] ?[phone] ? ?     OFFICE FOLLOW-UP NOTE ? ?[name] ?Date of Birth:  10-15-1955 ?Medical Record Number:  [id number]  ? ?HPI: Initial visit 07/22/2021 she returns for follow-up after last visit with Dr. Jannifer Huffman on 03/09/2021.  She is accompanied by her grandson.  Patient's main complaint today is memory difficulties which she has had for 10 years she feels that it is not progressive but family feels that it is bothersome.  She has trouble remembering recent information and making new memories.  She struggles with new information.  She lives alone and still cooks for herself and washes and does her dishes and uses the bathroom.  She has stopped driving.  She has not had any evaluation for reversible causes of memory loss or tried any memory medications.  There is no family history of Alzheimer's or dementia.  Patient has no recent symptoms of stroke or TIA.  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  She is currently on Topamax which she had been taking for migraine but has not had a migraine for several years now.  She is willing to reduce and stop it.  She feels she still has some residual speech difficulties from a stroke in May she has sometimes trouble finding words and speaking.  She is still dragging the right side as well and balance is also slightly off and not back to baseline.  She remains on Keppra for seizures and has not had any breakthrough seizures.  She remains on aspirin and Plavix or stroke prevention as well as on Lipitor and Praluent injections for lipids.  Blood pressure well controlled today it is 135/80 ?Last visit 03/09/2021 Dr. Jannifer Huffman : Samantha Huffman is a 66 year old right-handed black female with a history of diabetes, dyslipidemia, coronary artery disease, hypertension, tobacco abuse, gastroesophageal reflux disease, history of seizures, history of memory disturbance,  history of migraine headache, history of sleep apnea on CPAP, and a history of renal calculi.  The patient was admitted to the hospital on 20 Jan 2021 with onset of a right hemiataxia syndrome.  The patient was found to have a right superior cerebellar stroke but also had a small stroke in the right posterior inferior cerebellar artery distribution and a left thalamic stroke.  The patient underwent MRA of the head and neck showing occlusion of the right superior cerebellar artery.  Mild stenosis at the origin of the right vertebral artery was noted, the right PICA was not visualized.  The patient had evidence of acute strokes in the right PICA distribution, left thalamus, and right superior cerebellar artery distribution.  The patient has had significant issues with ataxic speech, right hemiataxia, and some memory changes.  The patient had cataract surgery just prior to the stroke event, she had some incomplete return of vision following cataract surgery, this has persisted involving the right eye only, not the left.  She reports some visual blurring in the superior aspects of her vision in the right eye.  The patient is using a walker to get around, she is living with her family.  She requires some assistance with bathing and dressing and she cannot fix meals.  She denies any numbness of extremities or any true weakness of extremities.  She was smoking a pack of cigarettes every 1.5 days, she has quit smoking since the stroke.  She is on aspirin and Plavix combination which she was  on prior to the stroke.  The patient did have a headache around the time of the stroke but this has improved.  She denies any problems with choking with swallowing.  She has done well with her seizure control on Keppra and Topamax. ?Update 12/21/2021 : She returns for follow-up after last visit with me 6 months ago.  She is accompanied by her daughter.  Patient continues to have mild short-term memory difficulties but these appear to be  unchanged and are not progressing.  She is still living at home by herself.  She gets help from her daughter who arranges her medications as well as helps her with about otherwise she does most activities for herself.  She in fact wants to drive and she has not driven for years since her stroke.  She is back on Keppra 500 mg twice daily which is tolerating well without any side effects.  She has had no seizures.  She has had no recurrent stroke or TIA symptoms either.  She remains on aspirin Plavix which is tolerating well without bruising or bleeding.  She is tolerating Lipitor well without muscle aches and pains.  She states her sugars are under good control.  She had lipid panel 09/03/2021 which showed LDL cholesterol of 8 mg percent and total cholesterol of 66.  Zetia was discontinued after this primary physician. ?ROS:   ?14 system review of systems is positive for memory loss, speech difficulties, word finding difficulties and all other systems negative ? ?PMH:  ?Past Medical History:  ?Diagnosis Date  ? Carotid artery stenosis   ? 1-39% bilateral stenosis by dopplers 08/2020  ? Coronary artery disease   ? a. s/p CABG 10/2012.  cath 05/2016 with moderate LM stenosis, mild LCx and RCA stenosis and occluded LAD with patent LIMA to LAD, free radial to OM.  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  ? Dyslipidemia   ? Family history of adverse reaction to anesthesia   ? mother and sister ponv  ? Generalized convulsive epilepsy without mention of intractable epilepsy   ? GERD (gastroesophageal reflux disease)   ? History of kidney stones   ? Hypertension   ? Memory deficit   ? slight  ? Migraine   ? "controlled on daily RX " (06/11/2016)  ? Nephrolithiasis 07/30/2015  ? S/p lithotripsy x 3 and right and left ureter stents  ? OSA (obstructive sleep apnea)   ? cpap   ? PONV (postoperative nausea and vomiting)   ? Seizures ([place])   ? "controlled w/daily RX; started in 2012; dr thinks they might be from the migraines; last  sz was early part of 2016" (06/11/2016)  ? Type II diabetes mellitus ([place])   ? metformin  ? Vasovagal syncope 07/30/2015  ? 2011  ? ? ?Social History:  ?Social History  ? ?Socioeconomic History  ? Marital status: Divorced  ?  Spouse name: Not on file  ? Number of children: 2  ? Years of education: 67  ? Highest education level: Not on file  ?Occupational History  ? Occupation: retired  ?Tobacco Use  ? Smoking status: Some Days  ?  Packs/day: 0.33  ?  Years: 35.00  ?  Pack years: 11.55  ?  Types: Cigarettes  ?  Last attempt to quit: 06/11/2016  ?  Years since quitting: 5.5  ? Smokeless tobacco: Former  ?  Types: Snuff, Chew  ?  Quit date: 11/20/2012  ?Vaping Use  ? Vaping Use: Never used  ?  Substance and Sexual Activity  ? Alcohol use: Yes  ?  Comment: 06/11/2016 "might have a few drinks/month; usually wine"  ? Drug use: No  ? Sexual activity: Not Currently  ?Other Topics Concern  ? Not on file  ?Social History Narrative  ? Patient drinks about 1 cup of coffee daily.  ? Patient is right handed.   ? ?Social Determinants of Health  ? ?Financial Resource Strain: Not on file  ?Food Insecurity: Not on file  ?Transportation Needs: Not on file  ?Physical Activity: Not on file  ?Stress: Not on file  ?Social Connections: Not on file  ?Intimate Partner Violence: Not on file  ? ? ?Medications:   ?Current Outpatient Medications on File Prior to Visit  ?Medication Sig Dispense Refill  ? acetaminophen (TYLENOL) 325 MG tablet Take 1-2 tablets (325-650 mg total) by mouth every 4 (four) hours as needed for mild pain.    ? Alirocumab (PRALUENT) 75 MG/ML SOAJ INJECT CONTENTS OF 1 PEN INTO THE SKIN EVERY 14 DAYS 6 mL 3  ? aspirin EC 325 MG tablet Take 1 tablet (325 mg total) by mouth daily. NEED TO PURCHASE THIS OVER THE COUNTER 360 tablet 0  ? atorvastatin (LIPITOR) 80 MG tablet Take 1 tablet (80 mg total) by mouth daily. 90 tablet 3  ? carvedilol (COREG) 3.125 MG tablet Take 1 tablet (3.125 mg total) by mouth daily. 90 tablet 3  ?  clopidogrel (PLAVIX) 75 MG tablet Take 1 tablet (75 mg total) by mouth daily. 90 tablet 3  ? escitalopram (LEXAPRO) 10 MG tablet Take 1 tablet (10 mg total) by mouth at bedtime. 30 tablet 0  ? isosorbide mono

## 2021-12-21 NOTE — Patient Instructions (Signed)
I had a long discussion with the patient and her daughter  regarding her memory loss and cognitive impairment and prior history of stroke and seizures..  She will continue Keppra for seizure prophylaxis.  Continue aspirin and Plavix for stroke prevention and maintain aggressive risk factor modification with strict control of hypertension blood pressure goal below 130/90, lipids with LDL cholesterol goal below 70 mg percent and diabetes with hemoglobin A1c goal below 6.5%.  She was advised to increase participation in cognitively challenging activities like solving crossword puzzles, playing bridge and sudoku.  She was advised to use a walker at all times while walking outdoors and long distances.  The patient may begin driving but I advised her to have somebody sit next to her initially till she can drive confidently.  She will return for follow-up in the future in a year or call earlier if necessary.   ?

## 2022-01-25 ENCOUNTER — Other Ambulatory Visit: Payer: Medicare HMO

## 2022-01-25 DIAGNOSIS — E785 Hyperlipidemia, unspecified: Secondary | ICD-10-CM

## 2022-01-25 LAB — LIPID PANEL
Chol/HDL Ratio: 2 ratio (ref 0.0–4.4)
Cholesterol, Total: 86 mg/dL — ABNORMAL LOW (ref 100–199)
HDL: 43 mg/dL (ref >39)
LDL Chol Calc (NIH): 18 mg/dL (ref 0–99)
Triglycerides: 145 mg/dL (ref 0–149)
VLDL Cholesterol Cal: 25 mg/dL (ref 5–40)

## 2022-01-26 ENCOUNTER — Other Ambulatory Visit: Payer: Self-pay | Admitting: *Deleted

## 2022-01-26 DIAGNOSIS — Z122 Encounter for screening for malignant neoplasm of respiratory organs: Secondary | ICD-10-CM

## 2022-01-26 DIAGNOSIS — Z87891 Personal history of nicotine dependence: Secondary | ICD-10-CM

## 2022-01-29 ENCOUNTER — Other Ambulatory Visit: Payer: Self-pay | Admitting: Physical Medicine and Rehabilitation

## 2022-02-12 ENCOUNTER — Other Ambulatory Visit: Payer: Self-pay | Admitting: Physical Medicine and Rehabilitation

## 2022-02-24 ENCOUNTER — Encounter: Payer: Medicare HMO | Admitting: Acute Care

## 2022-02-24 ENCOUNTER — Ambulatory Visit (HOSPITAL_BASED_OUTPATIENT_CLINIC_OR_DEPARTMENT_OTHER): Payer: Medicare HMO

## 2022-03-02 ENCOUNTER — Encounter: Payer: Self-pay | Admitting: Acute Care

## 2022-03-02 ENCOUNTER — Ambulatory Visit (HOSPITAL_BASED_OUTPATIENT_CLINIC_OR_DEPARTMENT_OTHER)
Admission: RE | Admit: 2022-03-02 | Discharge: 2022-03-02 | Disposition: A | Discharge status: Home / Self Care | Payer: Medicare HMO | Source: Ambulatory Visit | Attending: Acute Care | Admitting: Acute Care

## 2022-03-02 ENCOUNTER — Ambulatory Visit (INDEPENDENT_AMBULATORY_CARE_PROVIDER_SITE_OTHER): Payer: Medicare HMO | Admitting: Acute Care

## 2022-03-02 DIAGNOSIS — Z122 Encounter for screening for malignant neoplasm of respiratory organs: Secondary | ICD-10-CM | POA: Insufficient documentation

## 2022-03-02 DIAGNOSIS — Z87891 Personal history of nicotine dependence: Secondary | ICD-10-CM | POA: Insufficient documentation

## 2022-03-02 NOTE — Patient Instructions (Signed)
Thank you for participating in the [facility]. It was our pleasure to meet you today. We will call you with the results of your scan within the next few days. Your scan will be assigned a Lung RADS category score by the physicians reading the scans.  This Lung RADS score determines follow up scanning.  See below for description of categories, and follow up screening recommendations. We will be in touch to schedule your follow up screening annually or based on recommendations of our providers. We will fax a copy of your scan results to your Primary Care Physician, or the physician who referred you to the program, to ensure they have the results. Please call the office if you have any questions or concerns regarding your scanning experience or results.  Our office number is [phone]. Please speak with Denise Phelps, RN. , or  Denise Buckner RN, They are  our Lung Cancer Screening RN.'s If They are unavailable when you call, Please leave a message on the voice mail. We will return your call at our earliest convenience.This voice mail is monitored several times a day.  Remember, if your scan is normal, we will scan you annually as long as you continue to meet the criteria for the program. (Age 55-77, Current smoker or smoker who has quit within the last 15 years). If you are a smoker, remember, quitting is the single most powerful action that you can take to decrease your risk of lung cancer and other pulmonary, breathing related problems. We know quitting is hard, and we are here to help.  Please let us know if there is anything we can do to help you meet your goal of quitting. If you are a former smoker, congratulations. We are proud of you! Remain smoke free! Remember you can refer friends or family members through the number above.  We will screen them to make sure they meet criteria for the program. Thank you for helping us take better care of you by  participating in Lung Screening.  You can receive free nicotine replacement therapy ( patches, gum or mints) by calling 1-800-QUIT NOW. Please call so we can get you on the path to becoming  a non-smoker. I know it is hard, but you can do this!  Lung RADS Categories:  Lung RADS 1: no nodules or definitely non-concerning nodules.  Recommendation is for a repeat annual scan in 12 months.  Lung RADS 2:  nodules that are non-concerning in appearance and behavior with a very low likelihood of becoming an active cancer. Recommendation is for a repeat annual scan in 12 months.  Lung RADS 3: nodules that are probably non-concerning , includes nodules with a low likelihood of becoming an active cancer.  Recommendation is for a 6-month repeat screening scan. Often noted after an upper respiratory illness. We will be in touch to make sure you have no questions, and to schedule your 6-month scan.  Lung RADS 4 A: nodules with concerning findings, recommendation is most often for a follow up scan in 3 months or additional testing based on our provider's assessment of the scan. We will be in touch to make sure you have no questions and to schedule the recommended 3 month follow up scan.  Lung RADS 4 B:  indicates findings that are concerning. We will be in touch with you to schedule additional diagnostic testing based on our provider's  assessment of the scan.  Other options for assistance in smoking cessation (   As covered by your insurance benefits)  Hypnosis for smoking cessation  [employer] [phone]  Acupuncture for smoking cessation  [facility] [phone]   

## 2022-03-02 NOTE — Progress Notes (Signed)
Virtual Visit via Video Note  I connected with Samantha Huffman on 03/02/22 at  3:30 PM EDT by a video enabled telemedicine application and verified that I am speaking with the correct person using two identifiers.  Location: Patient:  At home Provider: [address], [place], [place], [address]    I discussed the limitations of evaluation and management by telemedicine and the availability of in person appointments. The patient expressed understanding and agreed to proceed.   Shared Decision Making Visit Lung Cancer Screening Program [phone])   Eligibility: Age 66 y.o. Pack Years Smoking History Calculation 72 pack year smoking history (# packs/per year x # years smoked) Recent History of coughing up blood  no Unexplained weight loss? no ( >Than 15 pounds within the last 6 months ) Prior History Lung / other cancer no (Diagnosis within the last 5 years already requiring surveillance chest CT Scans). Smoking Status Former Smoker Former Smokers: Years since quit: 1 year  Quit Date: 2022  Visit Components: Discussion included one or more decision making aids. yes Discussion included risk/benefits of screening. yes Discussion included potential follow up diagnostic testing for abnormal scans. yes Discussion included meaning and risk of over diagnosis. yes Discussion included meaning and risk of False Positives. yes Discussion included meaning of total radiation exposure. yes  Counseling Included: Importance of adherence to annual lung cancer LDCT screening. yes Impact of comorbidities on ability to participate in the program. yes Ability and willingness to under diagnostic treatment. yes  Smoking Cessation Counseling: Current Smokers:  Discussed importance of smoking cessation. yes Information about tobacco cessation classes and interventions provided to patient. yes Patient provided with "ticket" for LDCT Scan. yes Symptomatic Patient. no  Counseling NA Diagnosis Code:  Tobacco Use Z72.0 Asymptomatic Patient yes  Counseling (Intermediate counseling: > three minutes counseling) T0626 Former Smokers:  Discussed the importance of maintaining cigarette abstinence. yes Diagnosis Code: Personal History of Nicotine Dependence. R48.546 Information about tobacco cessation classes and interventions provided to patient. Yes Patient provided with "ticket" for LDCT Scan. yes Written Order for Lung Cancer Screening with LDCT placed in Epic. Yes (CT Chest Lung Cancer Screening Low Dose W/O CM) [id number] Z12.2-Screening of respiratory organs Z87.891-Personal history of nicotine dependence  I spent 25 minutes of face to face time/virtual visit time  with  Samantha Huffman discussing the risks and benefits of lung cancer screening. We took the time to pause the power point at intervals to allow for questions to be asked and answered to ensure understanding. We discussed that she had taken the single most powerful action possible to decrease her risk of developing lung cancer when she quit smoking. I counseled her to remain smoke free, and to contact me if she ever had the desire to smoke again so that I can provide resources and tools to help support the effort to remain smoke free. We discussed the time and location of the scan, and that either  Doroteo Glassman RN, Joella Prince, RN or I  or I will call / send a letter with the results within  24-72 hours of receiving them. She has the office contact information in the event she needs to speak with me,  she verbalized understanding of all of the above and had no further questions upon leaving the office.     I explained to the patient that there has been a high incidence of coronary artery disease noted on these exams. I explained that this is a non-gated exam therefore degree or  severity cannot be determined. This patient is on statin therapy. I have asked the patient to follow-up with their PCP regarding any incidental finding of coronary  artery disease and management with diet or medication as they feel is clinically indicated. The patient verbalized understanding of the above and had no further questions.     Magdalen Spatz, NP 03/02/2022

## 2022-03-05 ENCOUNTER — Other Ambulatory Visit: Payer: Self-pay | Admitting: Acute Care

## 2022-03-05 DIAGNOSIS — Z122 Encounter for screening for malignant neoplasm of respiratory organs: Secondary | ICD-10-CM

## 2022-03-05 DIAGNOSIS — Z87891 Personal history of nicotine dependence: Secondary | ICD-10-CM

## 2022-04-06 ENCOUNTER — Encounter: Payer: Self-pay | Admitting: Neurology

## 2022-04-06 ENCOUNTER — Ambulatory Visit: Payer: Medicare HMO | Admitting: Neurology

## 2022-04-06 VITALS — BP 178/87 | HR 68 | Ht 61.0 in | Wt 154.2 lb

## 2022-04-06 DIAGNOSIS — G40309 Generalized idiopathic epilepsy and epileptic syndromes, not intractable, without status epilepticus: Secondary | ICD-10-CM | POA: Diagnosis not present

## 2022-04-06 DIAGNOSIS — I639 Cerebral infarction, unspecified: Secondary | ICD-10-CM | POA: Diagnosis not present

## 2022-04-06 DIAGNOSIS — I10 Essential (primary) hypertension: Secondary | ICD-10-CM | POA: Diagnosis not present

## 2022-04-06 MED ORDER — LEVETIRACETAM 500 MG PO TABS: 500.0000 mg | ORAL_TABLET | Freq: Two times a day (BID) | ORAL | 3 refills | Status: DC

## 2022-04-06 NOTE — Patient Instructions (Signed)
Keep a log of your BP in the morning, midday, evening, goal is < 130/90 Continue Keppra for seizure prevention  Continue to see your primary care doctor See you back in 1 year

## 2022-04-06 NOTE — Progress Notes (Addendum)
PATIENT: Jane Birkel DOB: [birth year]  REASON FOR VISIT: Follow up for memory, stroke, seizures HISTORY FROM: Patient PRIMARY NEUROLOGIST: Dr. Leonie Man   HISTORY OF PRESENT ILLNESS: Today 04/06/22 Maia Handa is here today for follow-up.  Denies seizures.  Remains on Keppra.  On aspirin and Plavix for stroke prevention.  Lipid panel 01/25/22 total cholesterol 86, LDL low 18.  Last visit B12 was 814, remains on supplement. MOCA 22/30 today. Lives alone, her daughter comes over to help with medications/bathing. Does drive if someone is in the car with her. BP 178/87, didn't take medications before appointment, on Lisinopril, Imdur, Coreg. Claims still having high readings at home in the 170's. No new stroke like symptoms. Speech is slightly slurred, when it rains right side feels weak. No falls, using quad cane. Does her own cooking. Denies headaches, last seizure around 2017.  Update 12/21/2021 Dr. Leonie Man : She returns for follow-up after last visit with me 6 months ago.  She is accompanied by her daughter.  Patient continues to have mild short-term memory difficulties but these appear to be unchanged and are not progressing. She is still living at home by herself.  She gets help from her daughter who arranges her medications as well as helps her with about otherwise she does most activities for herself.  She in fact wants to drive and she has not driven for years since her stroke.  She is back on Keppra 500 mg twice daily which is tolerating well without any side effects.  She has had no seizures.  She has had no recurrent stroke or TIA symptoms either.  She remains on aspirin Plavix which is tolerating well without bruising or bleeding.  She is tolerating Lipitor well without muscle aches and pains.  She states her sugars are under good control.  She had lipid panel 09/03/2021 which showed LDL cholesterol of 8 mg percent and total cholesterol of 66.  Zetia was discontinued after this primary  physician.  10/06/21 SS: Blessyn is here today for follow-up, seen by Dr. Leonie Man in November 2022 for memory.  In May 2022 she had a right superior cerebellar, right PICA, left thalamic infarcts. Stopped Keppra after daughters claimed she didn't have active rx on file. Last seizure was in 2017, with seizure, starts with headache, then stares off, then stares off. Tapered off Topamax.  B12 was slightly low at 227.  EEG was normal.  MRI of the brain showed expected evolutionary changes, no new findings.  Is on aspirin and Plavix. MMSE 25/30 today. Since the stroke, right sided weakness, using cane or walker, feels speech speed increases when can't get the words out. Living alone, isn't driving. Memory is so-so. Has given up reading due to memory. No significant headaches. Forgets where she puts things. Has been living alone since end of last year, daughters feel she has done well. Does her own ADLs, keeps the house up. Is on B 12 supplement.   HISTORY  HPI: She returns for follow-up after last visit with Dr. Jannifer Franklin on 03/09/2021.  She is accompanied by her grandson.  Patient's main complaint today is memory difficulties which she has had for 10 years she feels that it is not progressive but family feels that it is bothersome.  She has trouble remembering recent information and making new memories.  She struggles with new information.  She lives alone and still cooks for herself and washes and does her dishes and uses the bathroom.  She has stopped driving.  She has not had any evaluation for reversible causes of memory loss or tried any memory medications.  There is no family history of Alzheimer's or dementia.  Patient has no recent symptoms of stroke or TIA.  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  She is currently on Topamax which she had been taking for migraine but has not had a migraine for several years now.  She is willing to reduce and stop it.  She feels she  still has some residual speech difficulties from a stroke in May she has sometimes trouble finding words and speaking.  She is still dragging the right side as well and balance is also slightly off and not back to baseline.  She remains on Keppra for seizures and has not had any breakthrough seizures.  She remains on aspirin and Plavix or stroke prevention as well as on Lipitor and Praluent injections for lipids.  Blood pressure well controlled today it is 135/80  REVIEW OF SYSTEMS: Out of a complete 14 system review of symptoms, the patient complains only of the following symptoms, and all other reviewed systems are negative.  See HPI  ALLERGIES: Allergies  Allergen Reactions   Depakote [Divalproex Sodium] Other (See Comments)    Hyperammonemia   Other Other (See Comments)   Penicillins Hives, Other (See Comments) and Rash    Other reaction(s): GI intolerance Has patient had a PCN reaction causing immediate rash, facial/tongue/throat swelling, SOB or lightheadedness with hypotension: YES Has patient had a PCN reaction causing severe rash involving mucus membranes or skin necrosis: NO Has patient had a PCN reaction that required hospitalizationNO Has patient had a PCN reaction occurring within the last 10 years: NO If all of the above answers are "NO", then may proceed with Cephalosporin use. Other reaction(s): GI Upset (intolerance) Other reaction(s): GI intolerance Has patient had a PCN reaction causing immediate rash, facial/tongue/throat swelling, SOB or lightheadedness with hypotension: YES Has patient had a PCN reaction causing severe rash involving mucus membranes or skin necrosis: NO Has patient had a PCN reaction that required hospitalizationNO Has patient had a PCN reaction occurring within the last 10 years: NO If all of the above answers are "NO", then may proceed with Cephalosporin use.    HOME MEDICATIONS: Outpatient Medications Prior to Visit  Medication Sig Dispense Refill    acetaminophen (TYLENOL) 325 MG tablet Take 1-2 tablets (325-650 mg total) by mouth every 4 (four) hours as needed for mild pain.     Alirocumab (PRALUENT) 75 MG/ML SOAJ INJECT CONTENTS OF 1 PEN INTO THE SKIN EVERY 14 DAYS 6 mL 3   aspirin EC 325 MG tablet Take 1 tablet (325 mg total) by mouth daily. NEED TO PURCHASE THIS OVER THE COUNTER 360 tablet 0   atorvastatin (LIPITOR) 80 MG tablet Take 1 tablet (80 mg total) by mouth daily. 90 tablet 3   carvedilol (COREG) 3.125 MG tablet Take 1 tablet (3.125 mg total) by mouth daily. 90 tablet 3   clopidogrel (PLAVIX) 75 MG tablet Take 1 tablet (75 mg total) by mouth daily. 90 tablet 3   escitalopram (LEXAPRO) 10 MG tablet Take 1 tablet (10 mg total) by mouth at bedtime. 30 tablet 0   isosorbide mononitrate (IMDUR) 30 MG 24 hr tablet Take 0.5 tablets (15 mg total) by mouth daily. 45 tablet 3   lisinopril (ZESTRIL) 20 MG tablet Take 1 tablet (20 mg total) by mouth 2 (two) times daily. 180 tablet 3  metFORMIN (GLUCOPHAGE-XR) 500 MG 24 hr tablet Take 1 tablet (500 mg total) by mouth daily with breakfast. 30 tablet 0   nitroGLYCERIN (NITROSTAT) 0.4 MG SL tablet Place 1 tablet (0.4 mg total) under the tongue every 5 (five) minutes x 3 doses as needed for chest pain. 25 tablet 4   vitamin B-12 (CYANOCOBALAMIN) 1000 MCG tablet Take 1,000 mcg by mouth daily.     levETIRAcetam (KEPPRA) 500 MG tablet Take 1 tablet (500 mg total) by mouth 2 (two) times daily. 180 tablet 3   No facility-administered medications prior to visit.    PAST MEDICAL HISTORY: Past Medical History:  Diagnosis Date   Carotid artery stenosis    1-39% bilateral stenosis by dopplers 08/2020   Coronary artery disease    a. s/p CABG 10/2012.  cath 05/2016 with moderate LM stenosis, mild LCx and RCA stenosis and occluded LAD with patent LIMA to LAD, free radial to OM.  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   Dyslipidemia    Family history of adverse reaction to anesthesia    mother and  sister ponv   Generalized convulsive epilepsy without mention of intractable epilepsy    GERD (gastroesophageal reflux disease)    History of kidney stones    Hypertension    Memory deficit    slight   Migraine    "controlled on daily RX " (06/11/2016)   Nephrolithiasis 07/30/2015   S/p lithotripsy x 3 and right and left ureter stents   OSA (obstructive sleep apnea)    cpap    PONV (postoperative nausea and vomiting)    Seizures ([place])    "controlled w/daily RX; started in 2012; dr thinks they might be from the migraines; last sz was early part of 2016" (06/11/2016)   Type II diabetes mellitus ([place])    metformin   Vasovagal syncope 07/30/2015   2011    PAST SURGICAL HISTORY: Past Surgical History:  Procedure Laterality Date   CARDIAC CATHETERIZATION  2014; 06/11/2016   CARDIAC CATHETERIZATION N/A 06/11/2016   Procedure: Left Heart Cath and Cors/Grafts Angiography;  Surgeon: Sherren Mocha, MD;  Location: [facility];  Service: Cardiovascular;  Laterality: N/A;   CESAREAN SECTION     CORONARY ANGIOPLASTY     CORONARY ARTERY BYPASS GRAFT  March, 2014   LIMA to LAD, left radial to LCx x 2   CYSTOSCOPY W/ URETERAL STENT PLACEMENT Left 01/16/2019   Procedure: CYSTOSCOPY WITH RETROGRADE PYELOGRAM/URETERAL LEFT STENT PLACEMENT;  Surgeon: Ardis Hughs, MD;  Location: [facility];  Service: Urology;  Laterality: Left;   CYSTOSCOPY/URETEROSCOPY/HOLMIUM LASER/STENT PLACEMENT Left 01/24/2019   Procedure: CYSTOSCOPY, LEFT URETEROSCOPY, HOLMIUM LASER, STONE REMOVAL AND STENT EXCHANGE;  Surgeon: Ardis Hughs, MD;  Location: [facility];  Service: Urology;  Laterality: Left;   LITHOTRIPSY  X 3   RIGHT/LEFT HEART CATH AND CORONARY/GRAFT ANGIOGRAPHY N/A 09/08/2018   Procedure: RIGHT/LEFT HEART CATH AND CORONARY/GRAFT ANGIOGRAPHY;  Surgeon: Martinique, Peter M, MD;  Location: [facility];  Service: Cardiovascular;  Laterality: N/A;   TOTAL ABDOMINAL HYSTERECTOMY     ovaries took before  hysterectomy   TUBAL LIGATION     URETERAL STENT PLACEMENT      FAMILY HISTORY: Family History  Problem Relation Age of Onset   Cancer Father    Diabetes Father    Diabetes Brother    Stroke Brother    Diabetes Brother    Heart disease Brother        CAD with CABG  Heart disease Sister        CAD with MI   Heart attack Sister     SOCIAL HISTORY: Social History   Socioeconomic History   Marital status: Divorced    Spouse name: Not on file   Number of children: 2   Years of education: 12   Highest education level: Not on file  Occupational History   Occupation: retired  Tobacco Use   Smoking status: Former    Packs/day: 0.33    Years: 35.00    Total pack years: 11.55    Types: Cigarettes    Quit date: 08/2020    Years since quitting: 1.6   Smokeless tobacco: Former    Types: Snuff, Chew    Quit date: 11/20/2012  Vaping Use   Vaping Use: Never used  Substance and Sexual Activity   Alcohol use: Yes    Comment: 06/11/2016 "might have a few drinks/month; usually wine"   Drug use: No   Sexual activity: Not Currently  Other Topics Concern   Not on file  Social History Narrative   Patient drinks about 1 cup of coffee daily.   Patient is right handed.    Social Determinants of Health   Financial Resource Strain: Not on file  Food Insecurity: Not on file  Transportation Needs: Not on file  Physical Activity: Not on file  Stress: Not on file  Social Connections: Not on file  Intimate Partner Violence: Not on file   PHYSICAL EXAM  Vitals:   04/06/22 0845  BP: (!) 178/87  Pulse: 68  Weight: 154 lb 3.2 oz (69.9 kg)  Height: '5\' 1"'$  (1.549 m)    Body mass index is 29.14 kg/m.  Generalized: Well developed, in no acute distress     12/21/2021    4:13 PM 10/06/2021    9:25 AM 07/22/2021    1:15 PM  MMSE - Mini Mental State Exam  Orientation to time '3 5 4  '$ Orientation to Place '5 5 5  '$ Registration '3 3 3  '$ Attention/ Calculation 1 0 0  Recall '3 3 3   '$ Language- name 2 objects '2 2 2  '$ Language- repeat '1 1 1  '$ Language- follow 3 step command '3 3 3  '$ Language- read & follow direction '1 1 1  '$ Write a sentence '1 1 1  '$ Copy design 1 1 0  Total score '24 25 23   '$ Neurological examination  Mentation: Alert oriented to time, place, history taking. MOCA 22/30. Follows all commands, speech is slightly slurred, has fragmented quality, with emphasis on certain aspects. Very pleasant.  Cranial nerve II-XII: Pupils were equal round reactive to light. Extraocular movements were full, visual field were full on confrontational test (Dr. Leonie Man noted dysmetria on left lateral gaze). Facial sensation and strength were normal. Head turning and shoulder shrug  were normal and symmetric. Motor: 4/5 right arm and leg, slightly increased tone on the right leg Sensory: Sensory testing is intact to soft touch on all 4 extremities. No evidence of extinction is noted.  Coordination: Moderate tremor with finger-nose-finger on the right Gait and station: Gait slightly wide-based, limp on the right, can walk independently short distances, uses quad cane in the hall Reflexes: Deep tendon reflexes are symmetric but exaggerated on the right arm and leg  DIAGNOSTIC DATA (LABS, IMAGING, TESTING) - I reviewed patient records, labs, notes, testing and imaging myself where available.  Lab Results  Component Value Date   WBC 5.8 02/09/2021   HGB 12.7 02/09/2021  HCT 37.9 02/09/2021   MCV 95.2 02/09/2021   PLT 182 02/09/2021      Component Value Date/Time   NA 137 10/07/2021 1028   K 4.3 10/07/2021 1028   CL 102 10/07/2021 1028   CO2 22 10/07/2021 1028   GLUCOSE 158 (H) 10/07/2021 1028   GLUCOSE 138 (H) 02/09/2021 0554   BUN 12 10/07/2021 1028   CREATININE 0.79 10/07/2021 1028   CREATININE 0.87 08/18/2020 0955   CALCIUM 10.2 10/07/2021 1028   PROT 6.7 01/31/2021 0501   PROT 7.4 09/15/2015 1026   ALBUMIN 3.7 01/31/2021 0501   ALBUMIN 4.4 09/15/2015 1026   AST 30  01/31/2021 0501   ALT 40 (H) 12/02/2021 0830   ALKPHOS 100 01/31/2021 0501   BILITOT 0.6 01/31/2021 0501   BILITOT 0.3 09/15/2015 1026   GFRNONAA 54 (L) 02/09/2021 0554   GFRAA >60 01/09/2020 0405   Lab Results  Component Value Date   CHOL 86 (L) 01/25/2022   HDL 43 01/25/2022   LDLCALC 18 01/25/2022   LDLDIRECT 67 06/13/2018   TRIG 145 01/25/2022   CHOLHDL 2.0 01/25/2022   Lab Results  Component Value Date   HGBA1C 7.1 (H) 01/21/2021   Lab Results  Component Value Date   DTOIZTIW58 099 10/06/2021   Lab Results  Component Value Date   TSH 1.310 07/27/2021   ASSESSMENT AND PLAN 66 y.o. year old female   1.  Right cerebellar stroke, right PICA stroke, left thalamic stroke 2.  History of seizures 3.  Hypertension 4.  Diabetes 5.  Hyperlipidemia 6.  Post-stroke memory loss, cognitive impairment  -BP elevated, did not take morning medicines (Imdur, lisinopril, Coreg), encouraged to keep a log 3 times a day, discuss with PCP next week, goal is less than 130/90 -No recent seizures, continue Keppra 500 mg twice a day -Memory is stable, MoCA 22/30 -On aspirin and Plavix for stroke prevention -Keep close follow-up with PCP to manage vascular risk factors, LDL less than 70, A1c less than 7.0, BP less than 130/90 -Follow-up in 1 year or sooner if needed  Evangeline Dakin, DNP 04/06/2022, 9:21 AM [facility] [address], [place], [place] [address] [phone]

## 2022-08-04 ENCOUNTER — Telehealth: Payer: Self-pay | Admitting: Neurology

## 2022-08-04 NOTE — Telephone Encounter (Signed)
I reviewed the chart, cardiology is handling therapy. Please call Dr. Kirby Funk office back and have them reach out to cardiology.  Thank you.

## 2022-08-04 NOTE — Telephone Encounter (Signed)
[facility] @ Dr Hammer's office states Dr Esmeralda Links would like a call re: status of the DAPT therapy for pt, please call.

## 2022-08-04 NOTE — Telephone Encounter (Signed)
Spoke with Samantha Huffman at Dr. Kirby Funk office. Relayed message Cardiology handling DAPT therapy, to reach out to Cardiology

## 2022-08-06 ENCOUNTER — Telehealth: Payer: Self-pay | Admitting: Cardiology

## 2022-08-06 NOTE — Telephone Encounter (Signed)
Ebony Hail with Dr. Geryl Rankins Hammer's office is requesting to speak directly with Dr. Theodosia Blender nurse. She states she has questions regarding Plavix and therapy.

## 2022-08-09 NOTE — Telephone Encounter (Signed)
Returned call to Dickens at PCP office.  The patient was seen there on 12/5 and indicated to them that she was not taking clopidogrel.  They had her restart it and notes she is also on aspirin 325 mg daily.    She will be due for visit with TT (Last seen 09/2020).  Will ask Dr. Radford Pax to confirm which dose of aspirin she should be taking.

## 2022-08-11 MED ORDER — ASPIRIN 81 MG PO TBEC: 81.0000 mg | DELAYED_RELEASE_TABLET | Freq: Every day | ORAL | 3 refills | Status: DC

## 2022-08-11 NOTE — Telephone Encounter (Signed)
Samantha Margarita, MD  You2 days ago   From Cardiac standpoint she should only be taking ASA '81mg'$  daily but this may have been changed by someone else since I saw her last - please check with her neurologist Dr. Leonie Man if he wants her on a higher dose of ASA    Garvin Fila, MD  You4 hours ago (7:19 AM)   I agree with aspirin 81 mg daily    ________________________________________________________________________ [name] the patient.  She confirmed she has 81 mg aspirin and that is what she is taking.  I updated her medication list.    She asked about appointment with Dr. Radford Pax for next year.  She has a recall in February.  Will forward to scheduling.

## 2022-08-12 NOTE — Therapy (Signed)
OUTPATIENT SPEECH LANGUAGE PATHOLOGY EVALUATION   Patient Name: Samantha Huffman MRN: [id number] DOB:[birth year], 66 y.o., female Today's Date: 08/13/2022  PCP: Collene Leyden, MD REFERRING PROVIDER: Collene Leyden, MD  END OF SESSION:  End of Session - 08/13/22 0943     Visit Number 1    Number of Visits 9    Date for SLP Re-Evaluation 10/08/22    Authorization Type [employer]    Progress Note Due on Visit 10    SLP Start Time 0845    SLP Stop Time  0924    SLP Time Calculation (min) 39 min    Activity Tolerance Patient tolerated treatment well             Past Medical History:  Diagnosis Date   Carotid artery stenosis    1-39% bilateral stenosis by dopplers 08/2020   Coronary artery disease    a. s/p CABG 10/2012.  cath 05/2016 with moderate LM stenosis, mild LCx and RCA stenosis and occluded LAD with patent LIMA to LAD, free radial to OM.  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   Dyslipidemia    Family history of adverse reaction to anesthesia    mother and sister ponv   Generalized convulsive epilepsy without mention of intractable epilepsy    GERD (gastroesophageal reflux disease)    History of kidney stones    Hypertension    Memory deficit    slight   Migraine    "controlled on daily RX " (06/11/2016)   Nephrolithiasis 07/30/2015   S/p lithotripsy x 3 and right and left ureter stents   OSA (obstructive sleep apnea)    cpap    PONV (postoperative nausea and vomiting)    Seizures ([place])    "controlled w/daily RX; started in 2012; dr thinks they might be from the migraines; last sz was early part of 2016" (06/11/2016)   Type II diabetes mellitus ([place])    metformin   Vasovagal syncope 07/30/2015   2011   Past Surgical History:  Procedure Laterality Date   CARDIAC CATHETERIZATION  2014; 06/11/2016   CARDIAC CATHETERIZATION N/A 06/11/2016   Procedure: Left Heart Cath and Cors/Grafts Angiography;  Surgeon: Sherren Mocha, MD;  Location: [facility];   Service: Cardiovascular;  Laterality: N/A;   CESAREAN SECTION     CORONARY ANGIOPLASTY     CORONARY ARTERY BYPASS GRAFT  March, 2014   LIMA to LAD, left radial to LCx x 2   CYSTOSCOPY W/ URETERAL STENT PLACEMENT Left 01/16/2019   Procedure: CYSTOSCOPY WITH RETROGRADE PYELOGRAM/URETERAL LEFT STENT PLACEMENT;  Surgeon: Ardis Hughs, MD;  Location: [facility];  Service: Urology;  Laterality: Left;   CYSTOSCOPY/URETEROSCOPY/HOLMIUM LASER/STENT PLACEMENT Left 01/24/2019   Procedure: CYSTOSCOPY, LEFT URETEROSCOPY, HOLMIUM LASER, STONE REMOVAL AND STENT EXCHANGE;  Surgeon: Ardis Hughs, MD;  Location: [facility];  Service: Urology;  Laterality: Left;   LITHOTRIPSY  X 3   RIGHT/LEFT HEART CATH AND CORONARY/GRAFT ANGIOGRAPHY N/A 09/08/2018   Procedure: RIGHT/LEFT HEART CATH AND CORONARY/GRAFT ANGIOGRAPHY;  Surgeon: Martinique, Peter M, MD;  Location: [facility];  Service: Cardiovascular;  Laterality: N/A;   TOTAL ABDOMINAL HYSTERECTOMY     ovaries took before hysterectomy   TUBAL LIGATION     URETERAL STENT PLACEMENT     Patient Active Problem List   Diagnosis Date Noted   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[place]) 03/09/2021   Cerebellar stroke ([place]) 01/30/2021   Acute cerebrovascular accident (CVA) ([place]) 01/20/2021   Acute CVA (cerebrovascular accident) (  [place]) 01/20/2021   GERD (gastroesophageal reflux disease)    Coronary artery disease    Depression 01/06/2020   Pneumonia 01/05/2020   History of chest pain 09/26/2018   Dyspnea on exertion 09/08/2018   Chest pain, rule out acute myocardial infarction 10/30/2017   Edema 01/25/2017   Tobacco abuse 06/10/2016   Headache 09/15/2015   OSA (obstructive sleep apnea) 07/30/2015   Nephrolithiasis 07/30/2015   Vasovagal syncope 07/30/2015   Carotid artery stenosis, asymptomatic 07/30/2015   CAD (coronary artery disease) 08/22/2013   Essential hypertension 08/22/2013   Diabetes mellitus without complication ([place])    Dyslipidemia    Seizures ([place])     Generalized convulsive epilepsy ([place]) 03/22/2013   Memory deficit 03/22/2013    ONSET DATE: referral 07-28-2022   REFERRING DIAG: Z86.73 (ICD-10-CM) - History of stroke   THERAPY DIAG:  Dysarthria and anarthria  Cognitive communication deficit  Rationale for Evaluation and Treatment: Rehabilitation  SUBJECTIVE:   SUBJECTIVE STATEMENT: "Sometimes my speech just gets muffled up" Pt accompanied by: self  PERTINENT HISTORY: May 2022 she had a right superior cerebellar, right PICA, left thalamic infarcts. Since the stroke, right sided weakness and decreased coordination. Describes as "shaky" movements.   PAIN:  Are you having pain? No  FALLS: Has patient fallen in last 6 months?  Yes  LIVING ENVIRONMENT: Lives with: lives alone but receives support from adult children Lives in: House/apartment  PLOF:  Level of assistance: Needed assistance with ADLs, Needed assistance with IADLS Employment: Retired  PATIENT GOALS: "I want to be able to talk the way I used to talk"   OBJECTIVE:   DIAGNOSTIC FINDINGS: 08/28/2021 MRI IMPRESSION: This MRI of the brain with and without contrast shows the following: 1.   Chronic infarctions involving the right superior cerebellar artery distribution with a smaller stroke in the distribution of one of the inferior cerebellar arteries.  The larger superior cerebellar artery distribution stroke shows gyriform enhancement.  Both of these strokes were acute on the 01/20/2021 MRI.  Although enhancement usually persist for a few weeks, occasionally this can be seen multiple months out. 2.   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  This was acute on the 01/20/2021 MRI. 3.   Scattered T2/FLAIR hyperintense foci in the hemispheres consistent with mild chronic microvascular ischemic changes. 4.   Brain volume is normal for age. 5.   No acute findings.  COGNITION: Overall cognitive status: History of cognitive impairments - at baseline Areas of  impairment: Attention, memory, executive functioning Functional deficits: difficulty reading d/t attention, working/short term memory; requires A for financial, schedule, medication, and home management   MOTOR SPEECH: assessed across variety of speech tasks: reading, word repetition, generative discourse sample Overall motor speech: impaired Level of impairment: Word Rate of Speech: Variable (normal to increased) Dysfluencies: none evidenced Phonation: normal Conversational loudness average: 74 dB Voice Quality: strained Respiration: thoracic breathing Word and Phrasal Stress: WFL Resonance: WFL Articulation: Impaired: word (most notable on fricatives and consonant clusters) Diadochokinetic Rate (DDK): irregular rhythm and poorly sequenced Intelligibility:  mild reduction, approximately 95% intelligible Motor planning: Appears intact Interfering components:  n/a Effective technique: slow rate, over articulate, pause, and pacing   ORAL MOTOR EXAMINATION: Overall status: WFL  Pt does not report difficulty with swallowing. Primary complaint with eating is dyssynergia, which makes feeding self laborious.   STANDARDIZED ASSESSMENTS: SLP employed to [employer] Framework to evaluate pt's speech production. See above.   PATIENT REPORTED OUTCOME MEASURES (PROM): Deferred d/t time  constraints   TODAY'S TREATMENT: 08-13-22: Education provided on evaluation results and SLP's recommendations. SLP provides education on therapy course will need to primarily focus on compensatory dysarthria strategies vs a return to pre-morbid speech. Pt verbalizes understanding. Collaborated with pt to generate POC and goals, with pt verbalizing agreement with POC. Initiated training re: reduced rate with self-monitoring to aid in increased intelligibility. At conclusion of session pt denies questions.   PATIENT EDUCATION: Education details: see above Person educated: Patient Education method:  Explanation, Demonstration, and Handouts Education comprehension: verbalized understanding, returned demonstration, and needs further education   GOALS: Goals reviewed with patient? Yes  SHORT TERM GOALS: Target date: 09-10-2022  Pt will verbalize and demonstrate 3 attention/memory strategies to aid functioning home with min A  Baseline: Goal status: INITIAL  2.  Pt will demonstrate dysarthria compensations during paragraph reading with occasional min-A over 2 sessions Baseline:  Goal status: INITIAL  3.  Given visual cue, pt will modulate rate in 90% of opportunities during 10 minute conversation Baseline:  Goal status: INITIAL  4.  During structured activity, pt will increased awareness of speech production through self monitoring for speech clarity (rate, pacing, breath support), evidenced by self rating using 5 pt scale which is equal to SLPs rating in 80% of trials  Baseline:  Goal status: INITIAL   LONG TERM GOALS: Target date: 10-08-2022  Pt will report completion of HEP 5/7 days over 1 week period Baseline:  Goal status: INITIAL  2.  Pt will self-monitor and adjust speech production to aid in clarity of speech during structured exercises with rare min-A from SLP Baseline:  Goal status: INITIAL  3.  Pt will recall details from simple to moderately complex reading following 5 minute latency period with use of compensations PRN Baseline:  Goal status: INITIAL  4.  Pt will evidence 98% intelligibility during 30 minute conversation with rare min-A Baseline:  Goal status: INITIAL  ASSESSMENT:  CLINICAL IMPRESSION: Patient is a 66 y.o. F who was seen today for cognitive linguistic evaluation at the request of her PCP. Ms. Sligh presents with mild dysarthria which is primarily c/b variable rate and loudness and imprecise articulation. Pt is overall intelligible during today's evaluation (95%), however reports her speech clarity varies based on time of day and duration of  speaking. Endorses decreased communication efficacy with family members, requiring usual repetitions and receiving comments on her speech. Tells SLP "it's really frustrating and I just get up and leave." Endorses deviation in attention and memory with need for care support from family members. Desires to trial strategies to enable ability to read books again, which she has not been able to do since stroke in May 2022. SLP recommends skilled ST services to increased pt's communication efficacy and confidence in ability to express thoughts/ideas with decreased avoidance and frustration.   OBJECTIVE IMPAIRMENTS: include attention, memory, executive functioning, and dysarthria. These impairments are limiting patient from managing medications, managing appointments, managing finances, household responsibilities, ADLs/IADLs, and effectively communicating at home and in community. Factors affecting potential to achieve goals and functional outcome are cooperation/participation level, previous level of function, and family/community support. Patient will benefit from skilled SLP services to address above impairments and improve overall function.  REHAB POTENTIAL: Fair (prognosis)  PLAN:  SLP FREQUENCY: 1x/week  SLP DURATION: 8 weeks  PLANNED INTERVENTIONS: Language facilitation, Cueing hierachy, Cognitive reorganization, Internal/external aids, Functional tasks, SLP instruction and feedback, Compensatory strategies, and Patient/family education   Su Monks, CCC-SLP 08/13/2022, 9:44 AM

## 2022-08-13 ENCOUNTER — Encounter: Payer: Self-pay | Admitting: Physical Therapy

## 2022-08-13 ENCOUNTER — Encounter: Payer: Self-pay | Admitting: Speech Pathology

## 2022-08-13 ENCOUNTER — Ambulatory Visit: Payer: Medicare HMO | Admitting: Speech Pathology

## 2022-08-13 ENCOUNTER — Other Ambulatory Visit: Payer: Self-pay

## 2022-08-13 ENCOUNTER — Ambulatory Visit: Payer: Medicare HMO | Attending: Family Medicine | Admitting: Physical Therapy

## 2022-08-13 VITALS — BP 141/80

## 2022-08-13 DIAGNOSIS — R471 Dysarthria and anarthria: Secondary | ICD-10-CM | POA: Diagnosis present

## 2022-08-13 DIAGNOSIS — R269 Unspecified abnormalities of gait and mobility: Secondary | ICD-10-CM | POA: Diagnosis present

## 2022-08-13 DIAGNOSIS — R278 Other lack of coordination: Secondary | ICD-10-CM | POA: Diagnosis present

## 2022-08-13 DIAGNOSIS — R41841 Cognitive communication deficit: Secondary | ICD-10-CM

## 2022-08-13 DIAGNOSIS — R2681 Unsteadiness on feet: Secondary | ICD-10-CM | POA: Diagnosis present

## 2022-08-13 NOTE — Therapy (Addendum)
OUTPATIENT PHYSICAL THERAPY NEURO EVALUATION   Patient Name: Samantha Huffman MRN: [id number] DOB:20-Nov-1955, 66 y.o., female Today's Date: 08/13/2022   PCP: Samantha Leyden, MD REFERRING PROVIDER: Collene Leyden, MD  END OF SESSION:  PT End of Session - 08/13/22 0842     Visit Number 1    Number of Visits 7    Date for PT Re-Evaluation 09/24/22    Authorization Type [employer]    Progress Note Due on Visit 10    Equipment Utilized During Treatment Gait belt    Activity Tolerance Patient tolerated treatment well    Behavior During Therapy [facility] for tasks assessed/performed             Past Medical History:  Diagnosis Date   Carotid artery stenosis    1-39% bilateral stenosis by dopplers 08/2020   Coronary artery disease    a. s/p CABG 10/2012.  cath 05/2016 with moderate LM stenosis, mild LCx and RCA stenosis and occluded LAD with patent LIMA to LAD, free radial to OM.  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   Dyslipidemia    Family history of adverse reaction to anesthesia    mother and sister ponv   Generalized convulsive epilepsy without mention of intractable epilepsy    GERD (gastroesophageal reflux disease)    History of kidney stones    Hypertension    Memory deficit    slight   Migraine    "controlled on daily RX " (06/11/2016)   Nephrolithiasis 07/30/2015   S/p lithotripsy x 3 and right and left ureter stents   OSA (obstructive sleep apnea)    cpap    PONV (postoperative nausea and vomiting)    Seizures ([place])    "controlled w/daily RX; started in 2012; dr thinks they might be from the migraines; last sz was early part of 2016" (06/11/2016)   Type II diabetes mellitus ([place])    metformin   Vasovagal syncope 07/30/2015   2011   Past Surgical History:  Procedure Laterality Date   CARDIAC CATHETERIZATION  2014; 06/11/2016   CARDIAC CATHETERIZATION N/A 06/11/2016   Procedure: Left Heart Cath and Cors/Grafts Angiography;  Surgeon: Samantha Mocha, MD;  Location:  [place] [facility];  Service: Cardiovascular;  Laterality: N/A;   CESAREAN SECTION     CORONARY ANGIOPLASTY     CORONARY ARTERY BYPASS GRAFT  March, 2014   LIMA to LAD, left radial to LCx x 2   CYSTOSCOPY W/ URETERAL STENT PLACEMENT Left 01/16/2019   Procedure: CYSTOSCOPY WITH RETROGRADE PYELOGRAM/URETERAL LEFT STENT PLACEMENT;  Surgeon: Samantha Hughs, MD;  Location: [facility];  Service: Urology;  Laterality: Left;   CYSTOSCOPY/URETEROSCOPY/HOLMIUM LASER/STENT PLACEMENT Left 01/24/2019   Procedure: CYSTOSCOPY, LEFT URETEROSCOPY, HOLMIUM LASER, STONE REMOVAL AND STENT EXCHANGE;  Surgeon: Samantha Hughs, MD;  Location: [facility];  Service: Urology;  Laterality: Left;   LITHOTRIPSY  X 3   RIGHT/LEFT HEART CATH AND CORONARY/GRAFT ANGIOGRAPHY N/A 09/08/2018   Procedure: RIGHT/LEFT HEART CATH AND CORONARY/GRAFT ANGIOGRAPHY;  Surgeon: Huffman, Samantha M, MD;  Location: [place] [facility];  Service: Cardiovascular;  Laterality: N/A;   TOTAL ABDOMINAL HYSTERECTOMY     ovaries took before hysterectomy   TUBAL LIGATION     URETERAL STENT PLACEMENT     Patient Active Problem List   Diagnosis Date Noted   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[place]) 03/09/2021   Cerebellar stroke ([place]) 01/30/2021   Acute cerebrovascular accident (CVA) ([place]) 01/20/2021   Acute CVA (cerebrovascular accident) ([place]) 01/20/2021  GERD (gastroesophageal reflux disease)    Coronary artery disease    Depression 01/06/2020   Pneumonia 01/05/2020   History of chest pain 09/26/2018   Dyspnea on exertion 09/08/2018   Chest pain, rule out acute myocardial infarction 10/30/2017   Edema 01/25/2017   Tobacco abuse 06/10/2016   Headache 09/15/2015   OSA (obstructive sleep apnea) 07/30/2015   Nephrolithiasis 07/30/2015   Vasovagal syncope 07/30/2015   Carotid artery stenosis, asymptomatic 07/30/2015   CAD (coronary artery disease) 08/22/2013   Essential hypertension 08/22/2013   Diabetes mellitus without complication ([place])    Dyslipidemia     Seizures ([place])    Generalized convulsive epilepsy ([place]) 03/22/2013   Memory deficit 03/22/2013    ONSET DATE: 07/28/2022 (referral date)  REFERRING DIAG: Z86.73 (ICD-10-CM) - History of stroke  THERAPY DIAG:  Abnormality of gait and mobility - Plan: PT plan of care cert/re-cert  Unsteadiness on feet - Plan: PT plan of care cert/re-cert  Rationale for Evaluation and Treatment: Rehabilitation  SUBJECTIVE:                                                                                                                                                                                             SUBJECTIVE STATEMENT: Patient reports that she has been doing fine since she was last here for therapy in 2022. She reports that she has had a few falls since then. She is still trying to keep her independence; her daughter is helping her bathe but she tries to do most of her cooking, cleaning, and gardening by herself. Patient reports that she had one fall due to lightheadness and another due to missing a step. Cannot recall what caused her other two falls. Patient has been using a large base quad cane since last year around September. Patient reports that she has had some memory changes since the stroke in the short term. Patient reports that when she had her falls she did not have her [facility] with her.  Pt accompanied by: self  PERTINENT HISTORY:   Per neurology note: 10/06/21 SS: Samantha Huffman is here today for follow-up, seen by Dr. Leonie Huffman in November 2022 for memory.  In May 2022 she had a right superior cerebellar, right PICA, left thalamic infarcts. Stopped Keppra after daughters claimed she didn't have active rx on file. Last seizure was in 2017, with seizure, starts with headache, then stares off, then stares off. Tapered off Topamax.  B12 was slightly low at 227.  EEG was normal.  MRI of the brain showed expected evolutionary changes, no new findings.  Is on aspirin and Plavix. MMSE 25/30 today. Since the  stroke, right sided weakness, using cane or walker, feels speech speed increases when can't get the words out. Living alone, isn't driving. Memory is so-so. Has given up reading due to memory. No significant headaches. Forgets where she puts things. Has been living alone since end of last year, daughters feel she has done well. Does her own ADLs, keeps the house up. Is on B 12 supplement.   PAIN:  Are you having pain? No  PRECAUTIONS: Fall  WEIGHT BEARING RESTRICTIONS: No  FALLS: Has patient fallen in last 6 months? Yes. Number of falls 4; patient fell and hit her head and had a small scratch, also at her daughters house going up steps  LIVING ENVIRONMENT: Lives with: lives with their family and lives alone (lives by herself but will stay with family intermittently) Lives in: House/apartment Stairs: No (none in own house; daughter's house has several flights of stairs with handrails; uses grandson to [employer] as well) Has following equipment at home: Programmer, multimedia, Environmental consultant - 2 wheeled, and Grab bars  PLOF: Patient receives help with grocery shopping, patient can drive if someone is in car with her but she tends to get rides from her kids, and receives help bathing  PATIENT GOALS: "Patient wants her right side to get back to how it was and walk without her [facility]."  OBJECTIVE:   DIAGNOSTIC FINDINGS:  From MRI on 08/29/2021  IMPRESSION: This MRI of the brain with and without contrast shows the following: 1.   Chronic infarctions involving the right superior cerebellar artery distribution with a smaller stroke in the distribution of one of the inferior cerebellar arteries.  The larger superior cerebellar artery distribution stroke shows gyriform enhancement.  Both of these strokes were acute on the 01/20/2021 MRI.  Although enhancement usually persist for a few weeks, occasionally this can be seen multiple months out. 2.   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  This was acute  on the 01/20/2021 MRI. 3.   Scattered T2/FLAIR hyperintense foci in the hemispheres consistent with mild chronic microvascular ischemic changes. 4.   Brain volume is normal for age. 5.   No acute findings.  Vitals:   08/13/22 0945  BP: (!) 141/80     COGNITION: Overall cognitive status: Impaired - reports reduced short term memory since stroke; [facility] for tasks performed during today's session   SENSATION: WFL  COORDINATION: Bilateral dysmetria with shin to knee test L > R Dysdidokinesia in bilateral UE L > R  EDEMA:  No recent swelling  MUSCLE TONE: RLE: Mild with knee extension   POSTURE: rounded shoulders and forward head  LOWER EXTREMITY ROM:    WFL for tasks performed during today's session   LOWER EXTREMITY MMT:   RLE grossly weaker than LLE (increased coordination noted with RLE compared to LLE during this testing)  BED MOBILITY:  Sit to supine Complete Independence Supine to sit Complete Independence  TRANSFERS: Assistive device utilized: Quad cane large base  Sit to stand: SBA Stand to sit: SBA Chair to chair: SBA  STAIRS: Level of Assistance: SBA Stair Negotiation Technique: Alternating Pattern  with Bilateral Rails Number of Stairs: 4  Height of Stairs: 6"  Comments: patient ascends rapidly and requires min cues for safety  GAIT: Gait pattern: Patient ambulates with quad base cane but frequently picks it up as she ambulates; demonstrates reduces lumbar curvature and tends to ambulate with forward hips and backwards lean likely to improve stability; decreased stride length and intermittent mild  ataxia resulting in unsteadiness Assistive device utilized: Quad cane large base Level of assistance: Modified independence  FUNCTIONAL TESTS:  Functional gait assessment: 15/30 5 times sit to stand: 15.34" w/o use of UE except on one attempt use thigh assist Timed up and go (TUG): 13.12" without use of LBQC min unsteadiness (SBA), 16.22" with modified  independence and LBQC  10 meter walk test: 0.90 Huffman/s with SBA both with and without [facility]   [facility] PT Assessment - 08/13/22 0001       Standardized Balance Assessment   Standardized Balance Assessment Berg Balance Test      Functional Gait  Assessment   Gait assessed  Yes    Gait Level Surface Walks 20 ft in less than 7 sec but greater than 5.5 sec, uses assistive device, slower speed, mild gait deviations, or deviates 6-10 in outside of the 12 in walkway width.    Change in Gait Speed Able to smoothly change walking speed without loss of balance or gait deviation. Deviate no more than 6 in outside of the 12 in walkway width.    Gait with Horizontal Head Turns Performs head turns smoothly with slight change in gait velocity (eg, minor disruption to smooth gait path), deviates 6-10 in outside 12 in walkway width, or uses an assistive device.    Gait with Vertical Head Turns Performs task with moderate change in gait velocity, slows down, deviates 10-15 in outside 12 in walkway width but recovers, can continue to walk.    Gait and Pivot Turn Pivot turns safely within 3 sec and stops quickly with no loss of balance.    Step Over Obstacle Is able to step over one shoe box (4.5 in total height) but must slow down and adjust steps to clear box safely. May require verbal cueing.    Gait with Narrow Base of Support Ambulates less than 4 steps heel to toe or cannot perform without assistance.    Gait with Eyes Closed Walks 20 ft, slow speed, abnormal gait pattern, evidence for imbalance, deviates 10-15 in outside 12 in walkway width. Requires more than 9 sec to ambulate 20 ft.    Ambulating Backwards Cannot walk 20 ft without assistance, severe gait deviations or imbalance, deviates greater than 15 in outside 12 in walkway width or will not attempt task.    Steps Alternating feet, must use rail.    Total Score 15            **Berg Balance not assessed  PATIENT SURVEYS:  ABC scale 62.5%  TODAY'S  TREATMENT:                                                                                                                               Initial Eval - not provided today  PATIENT EDUCATION: Education details: POC, goals, and falls risk safety Person educated: Patient Education method: Explanation Education comprehension: verbalized understanding and needs further education  HOME EXERCISE PROGRAM:  To be provided  GOALS: Goals reviewed with patient? Yes  SHORT TERM GOALS: Target date: 09/03/2022  Patient will demonstrate 100% compliance with initial HEP to continue to progress between physical therapy sessions.   Baseline: To be provided Goal status: INITIAL   2.  Patient will improve ABC Score to 67% or greater to indicate a decreased risk for falls and improved self-reported confidence in balance and sense of steadiness.   Baseline: Initial 62.5% Goal status: INITIAL  3.  Patient will improve FGA score to 18/30 to indicate progress towards a decreased risk of falls and improved dynamic stability.   Baseline: 15/30 Goal status: INITIAL   LONG TERM GOALS: Target date: 09/24/2022  Patient will report demonstrate independence with final HEP in order to maintain current gains and continue to progress after physical therapy discharge.   Baseline: To be provided  Goal status: INITIAL  2.  Patient will improve gait speed to 1.1 Huffman/s with LRAD or greater to indicate a reduced risk for falls.   Baseline: 0.9 Huffman/s Goal status: INITIAL  3.  Patient will improve ABC Score to 80% or greater to indicate a decreased risk for falls and improved self-reported confidence in balance and sense of steadiness.   Baseline: 62.5% Goal status: INITIAL  5.  Patient will improve FGA to greater than 22/30 to indicate a decreased risk of falls and improved dynamic stability.   Baseline: 15/30 Goal status: INITIAL  ASSESSMENT:  CLINICAL IMPRESSION: Patient is a 66 y.o. female who was seen today for  physical therapy evaluation and treatment for gait, unsteadiness, and history of recent falls related to chronic right superior cerebellar, right PICA, left thalamic infarcts. Patient is at an increased risk for falls as indicated by gait speed, FGA, and ABC score. Patient also presents with poor use of AD and will benefit from education and assessment of proper device to improve safety. Patient will benefit from updated HEP and/or walking program in order to continue to progress outside of physical therapy.  OBJECTIVE IMPAIRMENTS: Abnormal gait, decreased balance, decreased cognition, decreased coordination, decreased knowledge of use of DME, difficulty walking, decreased strength, and decreased safety awareness.   ACTIVITY LIMITATIONS: carrying, bending, standing, and stairs  PARTICIPATION LIMITATIONS: driving and shopping  PERSONAL FACTORS: Age, Past/current experiences, and 1 comorbidity: see PMH for details  are also affecting patient's functional outcome.   REHAB POTENTIAL: Good  CLINICAL DECISION MAKING: Evolving/moderate complexity  EVALUATION COMPLEXITY: Moderate  PLAN:  PT FREQUENCY: 1x/week  PT DURATION: 6 weeks  PLANNED INTERVENTIONS: Therapeutic exercises, Therapeutic activity, Neuromuscular re-education, Balance training, Gait training, Patient/Family education, Self Care, Joint mobilization, Stair training, and Manual therapy  PLAN FOR NEXT SESSION: create HEP/walking program, stair training, dynamic gait tasks  Malachi Carl, PT, DPT  08/13/2022, 11:55 AM

## 2022-08-17 ENCOUNTER — Other Ambulatory Visit: Payer: Self-pay | Admitting: Cardiology

## 2022-08-17 DIAGNOSIS — E119 Type 2 diabetes mellitus without complications: Secondary | ICD-10-CM

## 2022-08-17 DIAGNOSIS — I2510 Atherosclerotic heart disease of native coronary artery without angina pectoris: Secondary | ICD-10-CM

## 2022-08-17 DIAGNOSIS — E785 Hyperlipidemia, unspecified: Secondary | ICD-10-CM

## 2022-08-17 DIAGNOSIS — G4733 Obstructive sleep apnea (adult) (pediatric): Secondary | ICD-10-CM

## 2022-08-17 DIAGNOSIS — I10 Essential (primary) hypertension: Secondary | ICD-10-CM

## 2022-08-17 DIAGNOSIS — I6523 Occlusion and stenosis of bilateral carotid arteries: Secondary | ICD-10-CM

## 2022-08-17 DIAGNOSIS — R0602 Shortness of breath: Secondary | ICD-10-CM

## 2022-08-20 ENCOUNTER — Ambulatory Visit: Payer: Medicare HMO | Admitting: Speech Pathology

## 2022-08-20 ENCOUNTER — Encounter: Payer: Self-pay | Admitting: Physical Therapy

## 2022-08-20 ENCOUNTER — Ambulatory Visit: Payer: Medicare HMO | Admitting: Physical Therapy

## 2022-08-20 VITALS — BP 155/76 | HR 63

## 2022-08-20 DIAGNOSIS — R278 Other lack of coordination: Secondary | ICD-10-CM

## 2022-08-20 DIAGNOSIS — R41841 Cognitive communication deficit: Secondary | ICD-10-CM

## 2022-08-20 DIAGNOSIS — R2681 Unsteadiness on feet: Secondary | ICD-10-CM

## 2022-08-20 DIAGNOSIS — R269 Unspecified abnormalities of gait and mobility: Secondary | ICD-10-CM

## 2022-08-20 DIAGNOSIS — R471 Dysarthria and anarthria: Secondary | ICD-10-CM

## 2022-08-20 NOTE — Patient Instructions (Addendum)
Strategies to make communication effective:  Don't complete with background noise or other distractions Turn off the tv, the radio, sit in the back corner at a restaurant  Consider how many people you're talking to at once You said you do better in one-on-one conversations or small groups of particular people Even at a party, you can orchestrate one on one conversation, go to a quiet spot and have a personal conversation How many people you can talk to at once will depend on who you're talking to It can helpful to face your speaker  Consider facetime instead of telephone  Plan a time to meet and catch up instead of talking on the phone Remember the things you can do to be successful Tell people to give you time Come up with hand signal to relay you're getting interrupted Try and stay calm-- you said you do better when you're not worked up  Consider when it's worth the extra effort Be a BOSS with your speech Breathe  Over-articulate (break up longer words, say all the sounds)  Speak up! Slow down!

## 2022-08-20 NOTE — Therapy (Signed)
OUTPATIENT SPEECH LANGUAGE PATHOLOGY TREATMENT   Patient Name: Samantha Huffman MRN: [id number] DOB:1956-06-30, 66 y.o., female Today's Date: 08/20/2022  PCP: Collene Leyden, MD REFERRING PROVIDER: Collene Leyden, MD  END OF SESSION:  End of Session - 08/20/22 0845     Visit Number 2    Number of Visits 9    Date for SLP Re-Evaluation 10/08/22    Authorization Type [employer]    Progress Note Due on Visit 10    SLP Start Time 0845    SLP Stop Time  0930    SLP Time Calculation (min) 45 min    Activity Tolerance Patient tolerated treatment well              Past Medical History:  Diagnosis Date   Carotid artery stenosis    1-39% bilateral stenosis by dopplers 08/2020   Coronary artery disease    a. s/p CABG 10/2012.  cath 05/2016 with moderate LM stenosis, mild LCx and RCA stenosis and occluded LAD with patent LIMA to LAD, free radial to OM.  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   Dyslipidemia    Family history of adverse reaction to anesthesia    mother and sister ponv   Generalized convulsive epilepsy without mention of intractable epilepsy    GERD (gastroesophageal reflux disease)    History of kidney stones    Hypertension    Memory deficit    slight   Migraine    "controlled on daily RX " (06/11/2016)   Nephrolithiasis 07/30/2015   S/p lithotripsy x 3 and right and left ureter stents   OSA (obstructive sleep apnea)    cpap    PONV (postoperative nausea and vomiting)    Seizures ([place])    "controlled w/daily RX; started in 2012; dr thinks they might be from the migraines; last sz was early part of 2016" (06/11/2016)   Type II diabetes mellitus ([place])    metformin   Vasovagal syncope 07/30/2015   2011   Past Surgical History:  Procedure Laterality Date   CARDIAC CATHETERIZATION  2014; 06/11/2016   CARDIAC CATHETERIZATION N/A 06/11/2016   Procedure: Left Heart Cath and Cors/Grafts Angiography;  Surgeon: Sherren Mocha, MD;  Location: [facility];   Service: Cardiovascular;  Laterality: N/A;   CESAREAN SECTION     CORONARY ANGIOPLASTY     CORONARY ARTERY BYPASS GRAFT  March, 2014   LIMA to LAD, left radial to LCx x 2   CYSTOSCOPY W/ URETERAL STENT PLACEMENT Left 01/16/2019   Procedure: CYSTOSCOPY WITH RETROGRADE PYELOGRAM/URETERAL LEFT STENT PLACEMENT;  Surgeon: Ardis Hughs, MD;  Location: [facility];  Service: Urology;  Laterality: Left;   CYSTOSCOPY/URETEROSCOPY/HOLMIUM LASER/STENT PLACEMENT Left 01/24/2019   Procedure: CYSTOSCOPY, LEFT URETEROSCOPY, HOLMIUM LASER, STONE REMOVAL AND STENT EXCHANGE;  Surgeon: Ardis Hughs, MD;  Location: [facility];  Service: Urology;  Laterality: Left;   LITHOTRIPSY  X 3   RIGHT/LEFT HEART CATH AND CORONARY/GRAFT ANGIOGRAPHY N/A 09/08/2018   Procedure: RIGHT/LEFT HEART CATH AND CORONARY/GRAFT ANGIOGRAPHY;  Surgeon: Martinique, Peter M, MD;  Location: [facility];  Service: Cardiovascular;  Laterality: N/A;   TOTAL ABDOMINAL HYSTERECTOMY     ovaries took before hysterectomy   TUBAL LIGATION     URETERAL STENT PLACEMENT     Patient Active Problem List   Diagnosis Date Noted   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[place]) 03/09/2021   Cerebellar stroke ([place]) 01/30/2021   Acute cerebrovascular accident (CVA) ([place]) 01/20/2021   Acute CVA (cerebrovascular  accident) ([place]) 01/20/2021   GERD (gastroesophageal reflux disease)    Coronary artery disease    Depression 01/06/2020   Pneumonia 01/05/2020   History of chest pain 09/26/2018   Dyspnea on exertion 09/08/2018   Chest pain, rule out acute myocardial infarction 10/30/2017   Edema 01/25/2017   Tobacco abuse 06/10/2016   Headache 09/15/2015   OSA (obstructive sleep apnea) 07/30/2015   Nephrolithiasis 07/30/2015   Vasovagal syncope 07/30/2015   Carotid artery stenosis, asymptomatic 07/30/2015   CAD (coronary artery disease) 08/22/2013   Essential hypertension 08/22/2013   Diabetes mellitus without complication ([place])    Dyslipidemia    Seizures ([place])     Generalized convulsive epilepsy ([place]) 03/22/2013   Memory deficit 03/22/2013    ONSET DATE: referral 07-28-2022   REFERRING DIAG: Z86.73 (ICD-10-CM) - History of stroke   THERAPY DIAG:  Dysarthria and anarthria  Cognitive communication deficit  Rationale for Evaluation and Treatment: Rehabilitation  SUBJECTIVE:   SUBJECTIVE STATEMENT: "I did what you said"  PERTINENT HISTORY: May 2022 she had a right superior cerebellar, right PICA, left thalamic infarcts. Since the stroke, right sided weakness and decreased coordination. Describes as "shaky" movements.   PAIN:  Are you having pain? No  FALLS: Has patient fallen in last 6 months?  Yes, see PT eval  PATIENT GOALS: "I want to be able to talk the way I used to talk"   OBJECTIVE:   DIAGNOSTIC FINDINGS: 08/28/2021 MRI IMPRESSION: This MRI of the brain with and without contrast shows the following: 1.   Chronic infarctions involving the right superior cerebellar artery distribution with a smaller stroke in the distribution of one of the inferior cerebellar arteries.  The larger superior cerebellar artery distribution stroke shows gyriform enhancement.  Both of these strokes were acute on the 01/20/2021 MRI.  Although enhancement usually persist for a few weeks, occasionally this can be seen multiple months out. 2.   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  This was acute on the 01/20/2021 MRI. 3.   Scattered T2/FLAIR hyperintense foci in the hemispheres consistent with mild chronic microvascular ischemic changes. 4.   Brain volume is normal for age. 5.   No acute findings.  PATIENT REPORTED OUTCOME MEASURES (PROM): Deferred d/t time constraints  TODAY'S TREATMENT: 08-20-22: SLP provided education on strategies and compensations to aid in communication efficacy for the dysarthric speaker. Pt able to teach back through providing examples of opportunities in which she could employ discussed strategies. See handout. Use of BOSS  acronym to aid in recall of speech strategies: breathe, over-articulate, speak up, slow down. Target use of at generative sentence level with 3 syllable words. Pt with intelligible production in 19/20 trials with rare min-A, employs syllabification with mod-I following initial instruction.   08-13-22: Education provided on evaluation results and SLP's recommendations. SLP provides education on therapy course will need to primarily focus on compensatory dysarthria strategies vs a return to pre-morbid speech. Pt verbalizes understanding. Collaborated with pt to generate POC and goals, with pt verbalizing agreement with POC. Initiated training re: reduced rate with self-monitoring to aid in increased intelligibility. At conclusion of session pt denies questions.   PATIENT EDUCATION: Education details: see above Person educated: Patient Education method: Explanation, Demonstration, and Handouts Education comprehension: verbalized understanding, returned demonstration, and needs further education   GOALS: Goals reviewed with patient? Yes  SHORT TERM GOALS: Target date: 09-10-2022  Pt will verbalize and demonstrate 3 attention/memory strategies to aid functioning home with min A  Baseline:  Goal status: INITIAL  2.  Pt will demonstrate dysarthria compensations during paragraph reading with occasional min-A over 2 sessions Baseline:  Goal status: INITIAL  3.  Given visual cue, pt will modulate rate in 90% of opportunities during 10 minute conversation Baseline:  Goal status: INITIAL  4.  During structured activity, pt will increased awareness of speech production through self monitoring for speech clarity (rate, pacing, breath support), evidenced by self rating using 5 pt scale which is equal to SLPs rating in 80% of trials  Baseline:  Goal status: INITIAL   LONG TERM GOALS: Target date: 10-08-2022  Pt will report completion of HEP 5/7 days over 1 week period Baseline:  Goal status:  INITIAL  2.  Pt will self-monitor and adjust speech production to aid in clarity of speech during structured exercises with rare min-A from SLP Baseline:  Goal status: INITIAL  3.  Pt will recall details from simple to moderately complex reading following 5 minute latency period with use of compensations PRN Baseline:  Goal status: INITIAL  4.  Pt will evidence 98% intelligibility during 30 minute conversation with rare min-A Baseline:  Goal status: INITIAL  ASSESSMENT:  CLINICAL IMPRESSION: Patient is a 66 y.o. F who was seen today for cognitive linguistic evaluation at the request of her PCP. Ms. Najarro presents with mild dysarthria which is primarily c/b variable rate and loudness and imprecise articulation. Pt is overall intelligible during today's evaluation (95%), however reports her speech clarity varies based on time of day and duration of speaking. Endorses decreased communication efficacy with family members, requiring usual repetitions and receiving comments on her speech. Tells SLP "it's really frustrating and I just get up and leave." Endorses deviation in attention and memory with need for care support from family members. Desires to trial strategies to enable ability to read books again, which she has not been able to do since stroke in May 2022. SLP recommends skilled ST services to increased pt's communication efficacy and confidence in ability to express thoughts/ideas with decreased avoidance and frustration.   OBJECTIVE IMPAIRMENTS: include attention, memory, executive functioning, and dysarthria. These impairments are limiting patient from managing medications, managing appointments, managing finances, household responsibilities, ADLs/IADLs, and effectively communicating at home and in community. Factors affecting potential to achieve goals and functional outcome are cooperation/participation level, previous level of function, and family/community support. Patient will  benefit from skilled SLP services to address above impairments and improve overall function.  REHAB POTENTIAL: Fair (prognosis)  PLAN:  SLP FREQUENCY: 1x/week  SLP DURATION: 8 weeks  PLANNED INTERVENTIONS: Language facilitation, Cueing hierachy, Cognitive reorganization, Internal/external aids, Functional tasks, SLP instruction and feedback, Compensatory strategies, and Patient/family education   Su Monks, CCC-SLP 08/20/2022, 9:16 AM

## 2022-08-20 NOTE — Patient Instructions (Signed)
Access Code: [email or web address] URL: [email or web address] Date: 08/20/2022 Prepared by: Elease Etienne  Exercises - Walking with Eyes Closed and Counter Support  - 1 x daily - 5 x weekly - 3 sets - 10 reps - Walking with Head Nod  - 1 x daily - 5 x weekly - 3 sets - 10 reps - Standing Single Leg Stance with Counter Support  - 1 x daily - 5 x weekly - 1 sets - 3-4 reps - 20-30 seconds hold - Staggered Sit-to-Stand  - 1 x daily - 5 x weekly - 2 sets - 10 reps  You Can Walk For A Certain Length Of Time Each Day                          Walk 5 minutes 2-3 times per day.             Increase 2-3  minutes every 7 days              Work up to 20 minutes (1-2 times per day).               Example:                         Day 1-2           4-5 minutes     3 times per day                         Day 7-8           10-12 minutes 2-3 times per day                         Day 13-14       20-22 minutes 1-2 times per day

## 2022-08-20 NOTE — Therapy (Signed)
OUTPATIENT PHYSICAL THERAPY NEURO TREATMENT   Patient Name: Samantha Huffman MRN: [id number] DOB:[birth year], 66 y.o., female Today's Date: 08/20/2022   PCP: Collene Leyden, MD REFERRING PROVIDER: Collene Leyden, MD  END OF SESSION:  PT End of Session - 08/20/22 0927     Visit Number 2    Number of Visits 7    Date for PT Re-Evaluation 09/24/22    Authorization Type [employer] Medicare    Progress Note Due on Visit 10    PT Start Time 0930    PT Stop Time 1010    PT Time Calculation (min) 40 min    Equipment Utilized During Treatment Gait belt    Activity Tolerance Patient tolerated treatment well    Behavior During Therapy [facility] for tasks assessed/performed             Past Medical History:  Diagnosis Date   Carotid artery stenosis    1-39% bilateral stenosis by dopplers 08/2020   Coronary artery disease    a. s/p CABG 10/2012.  cath 05/2016 with moderate LM stenosis, mild LCx and RCA stenosis and occluded LAD with patent LIMA to LAD, free radial to OM.  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   Dyslipidemia    Family history of adverse reaction to anesthesia    mother and sister ponv   Generalized convulsive epilepsy without mention of intractable epilepsy    GERD (gastroesophageal reflux disease)    History of kidney stones    Hypertension    Memory deficit    slight   Migraine    "controlled on daily RX " (06/11/2016)   Nephrolithiasis 07/30/2015   S/p lithotripsy x 3 and right and left ureter stents   OSA (obstructive sleep apnea)    cpap    PONV (postoperative nausea and vomiting)    Seizures ([place])    "controlled w/daily RX; started in 2012; dr thinks they might be from the migraines; last sz was early part of 2016" (06/11/2016)   Type II diabetes mellitus ([place])    metformin   Vasovagal syncope 07/30/2015   2011   Past Surgical History:  Procedure Laterality Date   CARDIAC CATHETERIZATION  2014; 06/11/2016   CARDIAC CATHETERIZATION N/A 06/11/2016   Procedure: Left  Heart Cath and Cors/Grafts Angiography;  Surgeon: Sherren Mocha, MD;  Location: [facility];  Service: Cardiovascular;  Laterality: N/A;   CESAREAN SECTION     CORONARY ANGIOPLASTY     CORONARY ARTERY BYPASS GRAFT  March, 2014   LIMA to LAD, left radial to LCx x 2   CYSTOSCOPY W/ URETERAL STENT PLACEMENT Left 01/16/2019   Procedure: CYSTOSCOPY WITH RETROGRADE PYELOGRAM/URETERAL LEFT STENT PLACEMENT;  Surgeon: Ardis Hughs, MD;  Location: [facility];  Service: Urology;  Laterality: Left;   CYSTOSCOPY/URETEROSCOPY/HOLMIUM LASER/STENT PLACEMENT Left 01/24/2019   Procedure: CYSTOSCOPY, LEFT URETEROSCOPY, HOLMIUM LASER, STONE REMOVAL AND STENT EXCHANGE;  Surgeon: Ardis Hughs, MD;  [facility];  Service: Urology;  Laterality: Left;   LITHOTRIPSY  X 3   RIGHT/LEFT HEART CATH AND CORONARY/GRAFT ANGIOGRAPHY N/A 09/08/2018   Procedure: RIGHT/LEFT HEART CATH AND CORONARY/GRAFT ANGIOGRAPHY;  Surgeon: Martinique, Peter M, MD;  Location: [facility];  Service: Cardiovascular;  Laterality: N/A;   TOTAL ABDOMINAL HYSTERECTOMY     ovaries took before hysterectomy   TUBAL LIGATION     URETERAL STENT PLACEMENT     Patient Active Problem List   Diagnosis Date Noted   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[place]) 03/09/2021  Cerebellar stroke ([place]) 01/30/2021   Acute cerebrovascular accident (CVA) ([place]) 01/20/2021   Acute CVA (cerebrovascular accident) ([place]) 01/20/2021   GERD (gastroesophageal reflux disease)    Coronary artery disease    Depression 01/06/2020   Pneumonia 01/05/2020   History of chest pain 09/26/2018   Dyspnea on exertion 09/08/2018   Chest pain, rule out acute myocardial infarction 10/30/2017   Edema 01/25/2017   Tobacco abuse 06/10/2016   Headache 09/15/2015   OSA (obstructive sleep apnea) 07/30/2015   Nephrolithiasis 07/30/2015   Vasovagal syncope 07/30/2015   Carotid artery stenosis, asymptomatic 07/30/2015   CAD (coronary artery disease) 08/22/2013   Essential  hypertension 08/22/2013   Diabetes mellitus without complication ([place])    Dyslipidemia    Seizures ([place])    Generalized convulsive epilepsy ([place]) 03/22/2013   Memory deficit 03/22/2013    ONSET DATE: 07/28/2022 (referral date)  REFERRING DIAG: Z86.73 (ICD-10-CM) - History of stroke  THERAPY DIAG:  Abnormality of gait and mobility  Unsteadiness on feet  Other lack of coordination  Rationale for Evaluation and Treatment: Rehabilitation  SUBJECTIVE:                                                                                                                                                                                             SUBJECTIVE STATEMENT: She reports concern of right foot turning out into ER today.  States she has caught it on the table once a long time ago, but otherwise is does not cause any major issues walking.  She inquires about needing an insert of something in her shoe to correct this.  Denies falls or other acute changes since evaluation.  Pt states the neurologist permanently took her off her Keppra and other seizure meds.  Pt accompanied by: self  PERTINENT HISTORY:   Per neurology note: 10/06/21 SS: Adalynd is here today for follow-up, seen by Dr. Leonie Man in November 2022 for memory.  In May 2022 she had a right superior cerebellar, right PICA, left thalamic infarcts. Stopped Keppra after daughters claimed she didn't have active rx on file. Last seizure was in 2017, with seizure, starts with headache, then stares off, then stares off. Tapered off Topamax.  B12 was slightly low at 227.  EEG was normal.  MRI of the brain showed expected evolutionary changes, no new findings.  Is on aspirin and Plavix. MMSE 25/30 today. Since the stroke, right sided weakness, using cane or walker, feels speech speed increases when can't get the words out. Living alone, isn't driving. Memory is so-so. Has given up reading due to memory. No significant headaches. Forgets where she puts  things. Has been living alone since end of last year, daughters feel she has done well. Does her own ADLs, keeps the house up. Is on B 12 supplement.   PAIN:  Are you having pain? No  PRECAUTIONS: Fall  WEIGHT BEARING RESTRICTIONS: No  OBJECTIVE:   TODAY'S TREATMENT:                                                                                                                              LUE in sitting prior to session: Today's Vitals   08/20/22 0937  BP: (!) 155/76  Pulse: 63   -Standing march, pt able to perform unsupported -Forward walking with counter support and vertical nods -Forward walking with counter support and eyes closed -Counter support SLS 2x30 sec each LE -Staggered STS 2x12  -Discussed walking program for in home due to cold weather.  CURB:  Level of Assistance: SBA Assistive device utilized: Environmental manager Comments: Pt performs several reps of this with tendency to leave cane behind resulting in posterior lean once on curb.  Edu on safe sequencing for ascent and descent w/ repetition to promote carryover.  Pt requires mod cuing progressed to augmented feedback.  GAIT: Gait pattern: step to pattern, step through pattern, decreased arm swing- Right, and decreased trunk rotation Distance walked: 250' Assistive device utilized: Quad cane small base Level of assistance: SBA Comments: Variable cadence and stride, pt often leaving cane behind due to outpacing cane w/ 4 steps then placing the cane far ahead.  Extensive edu and demonstration of various patterns using cane w/ pt having difficulty sequencing w/o max cuing.  Discussed safety issues w/ using cane incorrectly and pt fall history when not using cane and importance of practicing safe use of cane.   PATIENT EDUCATION: Education details: Initial HEP and safety w/ cane when walking so as to not out pace the cane.  Initiated walking program. Person educated: Patient Education method:  Explanation Education comprehension: verbalized understanding and needs further education  HOME EXERCISE PROGRAM: Access Code: [email or web address] URL: [email or web address] Date: 08/20/2022 Prepared by: Elease Etienne  Exercises - Walking with Eyes Closed and Counter Support  - 1 x daily - 5 x weekly - 3 sets - 10 reps - Walking with Head Nod  - 1 x daily - 5 x weekly - 3 sets - 10 reps - Standing Single Leg Stance with Counter Support  - 1 x daily - 5 x weekly - 1 sets - 3-4 reps - 20-30 seconds hold - Staggered Sit-to-Stand  - 1 x daily - 5 x weekly - 2 sets - 10 reps  You Can Walk For A Certain Length Of Time Each Day                          Walk 5 minutes 2-3 times per day.             Increase  2-3  minutes every 7 days              Work up to 20 minutes (1-2 times per day).               Example:                         Day 1-2           4-5 minutes     3 times per day                         Day 7-8           10-12 minutes 2-3 times per day                         Day 13-14       20-22 minutes 1-2 times per day   GOALS: Goals reviewed with patient? Yes  SHORT TERM GOALS: Target date: 09/03/2022  Patient will demonstrate 100% compliance with initial HEP to continue to progress between physical therapy sessions.   Baseline: To be provided Goal status: INITIAL   2.  Patient will improve ABC Score to 67% or greater to indicate a decreased risk for falls and improved self-reported confidence in balance and sense of steadiness.   Baseline: Initial 62.5% Goal status: INITIAL  3.  Patient will improve FGA score to 18/30 to indicate progress towards a decreased risk of falls and improved dynamic stability.   Baseline: 15/30 Goal status: INITIAL   LONG TERM GOALS: Target date: 09/24/2022  Patient will report demonstrate independence with final HEP in order to maintain current gains and continue to progress after physical therapy discharge.   Baseline: To be provided   Goal status: INITIAL  2.  Patient will improve gait speed to 1.1 m/s with LRAD or greater to indicate a reduced risk for falls.   Baseline: 0.9 m/s Goal status: INITIAL  3.  Patient will improve ABC Score to 80% or greater to indicate a decreased risk for falls and improved self-reported confidence in balance and sense of steadiness.   Baseline: 62.5% Goal status: INITIAL  5.  Patient will improve FGA to greater than 22/30 to indicate a decreased risk of falls and improved dynamic stability.   Baseline: 15/30 Goal status: INITIAL  ASSESSMENT:  CLINICAL IMPRESSION: Emphasis of skilled PT session today on initiating HEP and walking program for patient.  PT addressed safety with cane over level ground and curb negotiation this visit.  Pt requires further training w/ cane vs w/o to determine safest means of ambulation and stair management in future.  Continue POC.  OBJECTIVE IMPAIRMENTS: Abnormal gait, decreased balance, decreased cognition, decreased coordination, decreased knowledge of use of DME, difficulty walking, decreased strength, and decreased safety awareness.   ACTIVITY LIMITATIONS: carrying, bending, standing, and stairs  PARTICIPATION LIMITATIONS: driving and shopping  PERSONAL FACTORS: Age, Past/current experiences, and 1 comorbidity: see PMH for details  are also affecting patient's functional outcome.   REHAB POTENTIAL: Good  CLINICAL DECISION MAKING: Evolving/moderate complexity  EVALUATION COMPLEXITY: Moderate  PLAN:  PT FREQUENCY: 1x/week  PT DURATION: 6 weeks  PLANNED INTERVENTIONS: Therapeutic exercises, Therapeutic activity, Neuromuscular re-education, Balance training, Gait training, Patient/Family education, Self Care, Joint mobilization, Stair training, and Manual therapy  PLAN FOR NEXT SESSION: Monitor L BP.  Modify HEP prn, stair training w/ vs w/o cane, dynamic  gait tasks-cane management with obstacles vs w/o, static balance in tandem, dual  task?  Elease Etienne, PT, DPT  08/20/2022, 10:16 AM

## 2022-08-24 NOTE — Therapy (Unsigned)
OUTPATIENT SPEECH LANGUAGE PATHOLOGY TREATMENT   Patient Name: Samantha Huffman MRN: [id number] DOB:09/27/1955, 67 y.o., female Today's Date: 08/24/2022  PCP: Collene Leyden, MD REFERRING PROVIDER: Collene Leyden, MD  END OF SESSION:     Past Medical History:  Diagnosis Date   Carotid artery stenosis    1-39% bilateral stenosis by dopplers 08/2020   Coronary artery disease    a. s/p CABG 10/2012.  cath 05/2016 with moderate LM stenosis, mild LCx and RCA stenosis and occluded LAD with patent LIMA to LAD, free radial to OM.  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   Dyslipidemia    Family history of adverse reaction to anesthesia    mother and sister ponv   Generalized convulsive epilepsy without mention of intractable epilepsy    GERD (gastroesophageal reflux disease)    History of kidney stones    Hypertension    Memory deficit    slight   Migraine    "controlled on daily RX " (06/11/2016)   Nephrolithiasis 07/30/2015   S/p lithotripsy x 3 and right and left ureter stents   OSA (obstructive sleep apnea)    cpap    PONV (postoperative nausea and vomiting)    Seizures ([place])    "controlled w/daily RX; started in 2012; dr thinks they might be from the migraines; last sz was early part of 2016" (06/11/2016)   Type II diabetes mellitus ([place])    metformin   Vasovagal syncope 07/30/2015   2011   Past Surgical History:  Procedure Laterality Date   CARDIAC CATHETERIZATION  2014; 06/11/2016   CARDIAC CATHETERIZATION N/A 06/11/2016   Procedure: Left Heart Cath and Cors/Grafts Angiography;  Surgeon: Sherren Mocha, MD;  Location: [facility];  Service: Cardiovascular;  Laterality: N/A;   CESAREAN SECTION     CORONARY ANGIOPLASTY     CORONARY ARTERY BYPASS GRAFT  March, 2014   LIMA to LAD, left radial to LCx x 2   CYSTOSCOPY W/ URETERAL STENT PLACEMENT Left 01/16/2019   Procedure: CYSTOSCOPY WITH RETROGRADE PYELOGRAM/URETERAL LEFT STENT PLACEMENT;  Surgeon: Ardis Hughs, MD;   Location: [facility];  Service: Urology;  Laterality: Left;   CYSTOSCOPY/URETEROSCOPY/HOLMIUM LASER/STENT PLACEMENT Left 01/24/2019   Procedure: CYSTOSCOPY, LEFT URETEROSCOPY, HOLMIUM LASER, STONE REMOVAL AND STENT EXCHANGE;  Surgeon: Ardis Hughs, MD;  Location: [facility];  Service: Urology;  Laterality: Left;   LITHOTRIPSY  X 3   RIGHT/LEFT HEART CATH AND CORONARY/GRAFT ANGIOGRAPHY N/A 09/08/2018   Procedure: RIGHT/LEFT HEART CATH AND CORONARY/GRAFT ANGIOGRAPHY;  Surgeon: Martinique, Peter M, MD;  Location: [facility];  Service: Cardiovascular;  Laterality: N/A;   TOTAL ABDOMINAL HYSTERECTOMY     ovaries took before hysterectomy   TUBAL LIGATION     URETERAL STENT PLACEMENT     Patient Active Problem List   Diagnosis Date Noted   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[place]) 03/09/2021   Cerebellar stroke ([place]) 01/30/2021   Acute cerebrovascular accident (CVA) ([place]) 01/20/2021   Acute CVA (cerebrovascular accident) ([place]) 01/20/2021   GERD (gastroesophageal reflux disease)    Coronary artery disease    Depression 01/06/2020   Pneumonia 01/05/2020   History of chest pain 09/26/2018   Dyspnea on exertion 09/08/2018   Chest pain, rule out acute myocardial infarction 10/30/2017   Edema 01/25/2017   Tobacco abuse 06/10/2016   Headache 09/15/2015   OSA (obstructive sleep apnea) 07/30/2015   Nephrolithiasis 07/30/2015   Vasovagal syncope 07/30/2015   Carotid artery stenosis, asymptomatic 07/30/2015   CAD (coronary  artery disease) 08/22/2013   Essential hypertension 08/22/2013   Diabetes mellitus without complication ([place])    Dyslipidemia    Seizures ([facility])    Generalized convulsive epilepsy ([place]) 03/22/2013   Memory deficit 03/22/2013    ONSET DATE: referral 07-28-2022   REFERRING DIAG: Z86.73 (ICD-10-CM) - History of stroke   THERAPY DIAG:  No diagnosis found.  Rationale for Evaluation and Treatment: Rehabilitation  SUBJECTIVE:   SUBJECTIVE STATEMENT: ***  PERTINENT HISTORY: May 2022  she had a right superior cerebellar, right PICA, left thalamic infarcts. Since the stroke, right sided weakness and decreased coordination. Describes as "shaky" movements.   PAIN:  Are you having pain? No  FALLS: Has patient fallen in last 6 months?  Yes, see PT eval  PATIENT GOALS: "I want to be able to talk the way I used to talk"   OBJECTIVE:   DIAGNOSTIC FINDINGS: 08/28/2021 MRI IMPRESSION: This MRI of the brain with and without contrast shows the following: 1.   Chronic infarctions involving the right superior cerebellar artery distribution with a smaller stroke in the distribution of one of the inferior cerebellar arteries.  The larger superior cerebellar artery distribution stroke shows gyriform enhancement.  Both of these strokes were acute on the 01/20/2021 MRI.  Although enhancement usually persist for a few weeks, occasionally this can be seen multiple months out. 2.   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  This was acute on the 01/20/2021 MRI. 3.   Scattered T2/FLAIR hyperintense foci in the hemispheres consistent with mild chronic microvascular ischemic changes. 4.   Brain volume is normal for age. 5.   No acute findings.  PATIENT REPORTED OUTCOME MEASURES (PROM): Deferred d/t time constraints  TODAY'S TREATMENT: 08-25-22: ***  08-20-22: SLP provided education on strategies and compensations to aid in communication efficacy for the dysarthric speaker. Pt able to teach back through providing examples of opportunities in which she could employ discussed strategies. See handout. Use of BOSS acronym to aid in recall of speech strategies: breathe, over-articulate, speak up, slow down. Target use of at generative sentence level with 3 syllable words. Pt with intelligible production in 19/20 trials with rare min-A, employs syllabification with mod-I following initial instruction.   08-13-22: Education provided on evaluation results and SLP's recommendations. SLP provides  education on therapy course will need to primarily focus on compensatory dysarthria strategies vs a return to pre-morbid speech. Pt verbalizes understanding. Collaborated with pt to generate POC and goals, with pt verbalizing agreement with POC. Initiated training re: reduced rate with self-monitoring to aid in increased intelligibility. At conclusion of session pt denies questions.   PATIENT EDUCATION: Education details: see above Person educated: Patient Education method: Explanation, Demonstration, and Handouts Education comprehension: verbalized understanding, returned demonstration, and needs further education   GOALS: Goals reviewed with patient? Yes  SHORT TERM GOALS: Target date: 09-10-2022  Pt will verbalize and demonstrate 3 attention/memory strategies to aid functioning home with min A  Baseline: Goal status: INITIAL  2.  Pt will demonstrate dysarthria compensations during paragraph reading with occasional min-A over 2 sessions Baseline:  Goal status: INITIAL  3.  Given visual cue, pt will modulate rate in 90% of opportunities during 10 minute conversation Baseline:  Goal status: INITIAL  4.  During structured activity, pt will increased awareness of speech production through self monitoring for speech clarity (rate, pacing, breath support), evidenced by self rating using 5 pt scale which is equal to SLPs rating in 80% of trials  Baseline:  Goal status: INITIAL   LONG TERM GOALS: Target date: 10-08-2022  Pt will report completion of HEP 5/7 days over 1 week period Baseline:  Goal status: INITIAL  2.  Pt will self-monitor and adjust speech production to aid in clarity of speech during structured exercises with rare min-A from SLP Baseline:  Goal status: INITIAL  3.  Pt will recall details from simple to moderately complex reading following 5 minute latency period with use of compensations PRN Baseline:  Goal status: INITIAL  4.  Pt will evidence 98%  intelligibility during 30 minute conversation with rare min-A Baseline:  Goal status: INITIAL  ASSESSMENT:  CLINICAL IMPRESSION: Patient is a 67 y.o. F who was seen today for cognitive linguistic evaluation at the request of her PCP. Ms. Sturdy presents with mild dysarthria which is primarily c/b variable rate and loudness and imprecise articulation. Pt is overall intelligible during today's evaluation (95%), however reports her speech clarity varies based on time of day and duration of speaking. Endorses decreased communication efficacy with family members, requiring usual repetitions and receiving comments on her speech. Tells SLP "it's really frustrating and I just get up and leave." Endorses deviation in attention and memory with need for care support from family members. Desires to trial strategies to enable ability to read books again, which she has not been able to do since stroke in May 2022. SLP recommends skilled ST services to increased pt's communication efficacy and confidence in ability to express thoughts/ideas with decreased avoidance and frustration.   OBJECTIVE IMPAIRMENTS: include attention, memory, executive functioning, and dysarthria. These impairments are limiting patient from managing medications, managing appointments, managing finances, household responsibilities, ADLs/IADLs, and effectively communicating at home and in community. Factors affecting potential to achieve goals and functional outcome are cooperation/participation level, previous level of function, and family/community support. Patient will benefit from skilled SLP services to address above impairments and improve overall function.  REHAB POTENTIAL: Fair (prognosis)  PLAN:  SLP FREQUENCY: 1x/week  SLP DURATION: 8 weeks  PLANNED INTERVENTIONS: Language facilitation, Cueing hierachy, Cognitive reorganization, Internal/external aids, Functional tasks, SLP instruction and feedback, Compensatory strategies, and  Patient/family education   Su Monks, CCC-SLP 08/24/2022, 4:37 PM

## 2022-08-25 ENCOUNTER — Ambulatory Visit: Payer: Medicare HMO | Attending: Family Medicine | Admitting: Speech Pathology

## 2022-08-25 ENCOUNTER — Encounter: Payer: Self-pay | Admitting: Physical Therapy

## 2022-08-25 ENCOUNTER — Ambulatory Visit: Payer: Medicare HMO | Admitting: Physical Therapy

## 2022-08-25 VITALS — BP 134/61 | HR 68

## 2022-08-25 DIAGNOSIS — R269 Unspecified abnormalities of gait and mobility: Secondary | ICD-10-CM | POA: Diagnosis present

## 2022-08-25 DIAGNOSIS — R278 Other lack of coordination: Secondary | ICD-10-CM | POA: Insufficient documentation

## 2022-08-25 DIAGNOSIS — R2681 Unsteadiness on feet: Secondary | ICD-10-CM | POA: Insufficient documentation

## 2022-08-25 DIAGNOSIS — R471 Dysarthria and anarthria: Secondary | ICD-10-CM | POA: Diagnosis present

## 2022-08-25 DIAGNOSIS — R41841 Cognitive communication deficit: Secondary | ICD-10-CM | POA: Insufficient documentation

## 2022-08-25 DIAGNOSIS — M6281 Muscle weakness (generalized): Secondary | ICD-10-CM | POA: Diagnosis present

## 2022-08-25 NOTE — Therapy (Unsigned)
OUTPATIENT PHYSICAL THERAPY NEURO TREATMENT   Patient Name: Samantha Huffman MRN: [id number] DOB:11-04-1955, 67 y.o., female Today's Date: 08/25/2022   PCP: Samantha Leyden, MD REFERRING PROVIDER: Collene Leyden, MD  END OF SESSION:  PT End of Session - 08/25/22 1021     Visit Number 3    Number of Visits 7    Date for PT Re-Evaluation 09/24/22    Authorization Type [employer]    Progress Note Due on Visit 10    PT Start Time 1015   handoff from speech   PT Stop Time 1054    PT Time Calculation (min) 39 min    Equipment Utilized During Treatment Gait belt    Activity Tolerance Patient tolerated treatment well    Behavior During Therapy Samantha Huffman for tasks assessed/performed             Past Medical History:  Diagnosis Date   Carotid artery stenosis    1-39% bilateral stenosis by dopplers 08/2020   Coronary artery disease    a. s/p CABG 10/2012.  cath 05/2016 with moderate LM stenosis, mild LCx and RCA stenosis and occluded LAD with patent LIMA to LAD, free radial to OM.  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   Dyslipidemia    Family history of adverse reaction to anesthesia    mother and sister ponv   Generalized convulsive epilepsy without mention of intractable epilepsy    GERD (gastroesophageal reflux disease)    History of kidney stones    Hypertension    Memory deficit    slight   Migraine    "controlled on daily RX " (06/11/2016)   Nephrolithiasis 07/30/2015   S/p lithotripsy x 3 and right and left ureter stents   OSA (obstructive sleep apnea)    cpap    PONV (postoperative nausea and vomiting)    Seizures ([facility])    "controlled w/daily RX; started in 2012; dr thinks they might be from the migraines; last sz was early part of 2016" (06/11/2016)   Type II diabetes mellitus ([facility])    metformin   Vasovagal syncope 07/30/2015   2011   Past Surgical History:  Procedure Laterality Date   CARDIAC CATHETERIZATION  2014; 06/11/2016   CARDIAC CATHETERIZATION N/A 06/11/2016    Procedure: Left Heart Cath and Cors/Grafts Angiography;  Surgeon: Samantha Mocha, MD;  Location: [facility];  Service: Cardiovascular;  Laterality: N/A;   CESAREAN SECTION     CORONARY ANGIOPLASTY     CORONARY ARTERY BYPASS GRAFT  March, 2014   LIMA to LAD, left radial to LCx x 2   CYSTOSCOPY W/ URETERAL STENT PLACEMENT Left 01/16/2019   Procedure: CYSTOSCOPY WITH RETROGRADE PYELOGRAM/URETERAL LEFT STENT PLACEMENT;  Surgeon: Samantha Hughs, MD;  [facility]: [facility];  Service: Urology;  Laterality: Left;   CYSTOSCOPY/URETEROSCOPY/HOLMIUM LASER/STENT PLACEMENT Left 01/24/2019   Procedure: CYSTOSCOPY, LEFT URETEROSCOPY, HOLMIUM LASER, STONE REMOVAL AND STENT EXCHANGE;  Surgeon: Samantha Hughs, MD;  [facility]: [facility];  Service: Urology;  Laterality: Left;   LITHOTRIPSY  X 3   RIGHT/LEFT HEART CATH AND CORONARY/GRAFT ANGIOGRAPHY N/A 09/08/2018   Procedure: RIGHT/LEFT HEART CATH AND CORONARY/GRAFT ANGIOGRAPHY;  Surgeon: Martinique, Peter M, MD;  Location: [facility];  Service: Cardiovascular;  Laterality: N/A;   TOTAL ABDOMINAL HYSTERECTOMY     ovaries took before hysterectomy   TUBAL LIGATION     URETERAL STENT PLACEMENT     Patient Active Problem List   Diagnosis Date Noted   Stroke due  to embolism ([facility]) 03/09/2021   Cerebellar stroke ([facility]) 01/30/2021   Acute cerebrovascular accident (CVA) ([facility]) 01/20/2021   Acute CVA (cerebrovascular accident) ([facility]) 01/20/2021   GERD (gastroesophageal reflux disease)    Coronary artery disease    Depression 01/06/2020   Pneumonia 01/05/2020   History of chest pain 09/26/2018   Dyspnea on exertion 09/08/2018   Chest pain, rule out acute myocardial infarction 10/30/2017   Edema 01/25/2017   Tobacco abuse 06/10/2016   Headache 09/15/2015   OSA (obstructive sleep apnea) 07/30/2015   Nephrolithiasis 07/30/2015   Vasovagal syncope 07/30/2015   Carotid artery stenosis, asymptomatic 07/30/2015   CAD (coronary artery disease) 08/22/2013    Essential hypertension 08/22/2013   Diabetes mellitus without complication ([facility])    Dyslipidemia    Seizures ([facility])    Generalized convulsive epilepsy ([facility]) 03/22/2013   Memory deficit 03/22/2013    ONSET DATE: 07/28/2022 (referral date)  REFERRING DIAG: Z86.73 (ICD-10-CM) - History of stroke  THERAPY DIAG:  Abnormality of gait and mobility  Unsteadiness on feet  Other lack of coordination  Rationale for Evaluation and Treatment: Rehabilitation  SUBJECTIVE:                                                                                                                                                                                             SUBJECTIVE STATEMENT: She states her MD recently increased her BP meds, she ate this morning and took her meds so is anxious to see her BP today.  She has been practicing her HEP without issues in home.  She is using NBQC most of the time outside the house and furniture surfing inside the home.  Pt accompanied by: self  PERTINENT HISTORY:   Per neurology note: 10/06/21 SS: Samantha Huffman is here today for follow-up, seen by Dr. Leonie Huffman in November 2022 for memory.  In May 2022 she had a right superior cerebellar, right PICA, left thalamic infarcts. Stopped Keppra after daughters claimed she didn't have active rx on file. Last seizure was in 2017, with seizure, starts with headache, then stares off, then stares off. Tapered off Topamax.  B12 was slightly low at 227.  EEG was normal.  MRI of the brain showed expected evolutionary changes, no new findings.  Is on aspirin and Plavix. MMSE 25/30 today. Since the stroke, right sided weakness, using cane or walker, feels speech speed increases when can't get the words out. Living alone, isn't driving. Memory is so-so. Has given up reading due to memory. No significant headaches. Forgets where she puts things. Has been living alone since end of last year, daughters feel she has done  well. Does her own ADLs, keeps the  house up. Is on B 12 supplement.   PAIN:  Are you having pain? No  PRECAUTIONS: Fall  WEIGHT BEARING RESTRICTIONS: No  OBJECTIVE:   TODAY'S TREATMENT:                                                                                                                              LUE in sitting prior to session: Today's Vitals   08/25/22 1017  BP: 134/61  Pulse: 68   STAIRS:  Level of Assistance: Modified independence and SBA  Stair Negotiation Technique: Step to Pattern Alternating Pattern  Forwards With use of AD: NBQC  with No Rails Bilateral Rails  Number of Stairs: 4x4   Height of Stairs: 6"  Comments: Pt performs reciprocally with bilateral rails needing to initiate step up with good leg and step down with bad leg only on first step.  Practiced x2 rounds of stairs w/ NBQC and no rail w/ pt requiring cues on second round only to not outpace cane.  GAIT: Gait pattern: step to pattern, step through pattern, decreased arm swing- Right, and decreased trunk rotation Distance walked: 200' + 200' Assistive device utilized: Quad cane small base and None Level of assistance: Modified independence, SBA, and CGA Comments: Stride somewhat more consistent today w/ pt demonstrating increased focus on task.  She also leaves the cane behind less often.  Cued to improve arm swing of RUE when using cane and BUE when without as pt tends to draw into elbow flexion.  Cued for obstacle approach as pt cuts close to 3 left oriented objects of various sizes and heights.   -Obstacle course using cones, 2" hurdle, and airex for step overs and arounds w/ augmented feedback progressing from 2 rounds w/ cane to no cane SBA-CGA. -Tandem on firm > foam 2x30 sec each LE each condition w/ single pinch grip support on back of chair, pt reporting fatigue w/ inc concentration to task requiring prolonged rest following.  PATIENT EDUCATION: Education details: Continue HEP and walking program and safety w/ cane when  walking so as to not out pace the cane.  Discussed using cane in home vs furniture surfing to decrease risk of stumbling or falling when reaching for next point of contact.  Person educated: Patient Education method: Explanation Education comprehension: verbalized understanding and needs further education  HOME EXERCISE PROGRAM: Access Code: [email or web address] URL: [email or web address] Date: 08/20/2022 Prepared by: Elease Etienne  Exercises - Walking with Eyes Closed and Counter Support  - 1 x daily - 5 x weekly - 3 sets - 10 reps - Walking with Head Nod  - 1 x daily - 5 x weekly - 3 sets - 10 reps - Standing Single Leg Stance with Counter Support  - 1 x daily - 5 x weekly - 1 sets - 3-4 reps - 20-30 seconds hold - Staggered Sit-to-Stand  - 1 x daily - 5 x weekly -  2 sets - 10 reps  You Can Walk For A Certain Length Of Time Each Day                          Walk 5 minutes 2-3 times per day.             Increase 2-3  minutes every 7 days              Work up to 20 minutes (1-2 times per day).               Example:                         Day 1-2           4-5 minutes     3 times per day                         Day 7-8           10-12 minutes 2-3 times per day                         Day 13-14       20-22 minutes 1-2 times per day   GOALS: Goals reviewed with patient? Yes  SHORT TERM GOALS: Target date: 09/03/2022  Patient will demonstrate 100% compliance with initial HEP to continue to progress between physical therapy sessions.   Baseline: To be provided Goal status: INITIAL   2.  Patient will improve ABC Score to 67% or greater to indicate a decreased risk for falls and improved self-reported confidence in balance and sense of steadiness.   Baseline: Initial 62.5% Goal status: INITIAL  3.  Patient will improve FGA score to 18/30 to indicate progress towards a decreased risk of falls and improved dynamic stability.   Baseline: 15/30 Goal status: INITIAL   LONG  TERM GOALS: Target date: 09/24/2022  Patient will report demonstrate independence with final HEP in order to maintain current gains and continue to progress after physical therapy discharge.   Baseline: To be provided  Goal status: INITIAL  2.  Patient will improve gait speed to 1.1 Huffman/s with LRAD or greater to indicate a reduced risk for falls.   Baseline: 0.9 Huffman/s Goal status: INITIAL  3.  Patient will improve ABC Score to 80% or greater to indicate a decreased risk for falls and improved self-reported confidence in balance and sense of steadiness.   Baseline: 62.5% Goal status: INITIAL  5.  Patient will improve FGA to greater than 22/30 to indicate a decreased risk of falls and improved dynamic stability.   Baseline: 15/30 Goal status: INITIAL  ASSESSMENT:  CLINICAL IMPRESSION: Emphasis of skilled PT session today on initiating HEP and walking program for patient.  PT addressed safety with cane over level ground and curb negotiation this visit.  Pt requires further training w/ cane vs w/o to determine safest means of ambulation and stair management in future.  Continue POC.  OBJECTIVE IMPAIRMENTS: Abnormal gait, decreased balance, decreased cognition, decreased coordination, decreased knowledge of use of DME, difficulty walking, decreased strength, and decreased safety awareness.   ACTIVITY LIMITATIONS: carrying, bending, standing, and stairs  PARTICIPATION LIMITATIONS: driving and shopping  PERSONAL FACTORS: Age, Past/current experiences, and 1 comorbidity: see PMH for details  are also affecting patient's functional outcome.   REHAB POTENTIAL: Good  CLINICAL  DECISION MAKING: Evolving/moderate complexity  EVALUATION COMPLEXITY: Moderate  PLAN:  PT FREQUENCY: 1x/week  PT DURATION: 6 weeks  PLANNED INTERVENTIONS: Therapeutic exercises, Therapeutic activity, Neuromuscular re-education, Balance training, Gait training, Patient/Family education, Self Care, Joint mobilization,  Stair training, and Manual therapy  PLAN FOR NEXT SESSION: Monitor L BP.  Modify HEP prn, stair training w/ vs w/o cane, dynamic gait tasks-cane management with obstacles vs w/o, static balance in tandem, dual task?  Elease Etienne, PT, DPT  08/25/2022, 10:55 AM

## 2022-08-30 ENCOUNTER — Ambulatory Visit (HOSPITAL_COMMUNITY)
Admission: RE | Admit: 2022-08-30 | Discharge: 2022-08-30 | Disposition: A | Discharge status: Home / Self Care | Payer: Medicare HMO | Source: Ambulatory Visit | Attending: Cardiology | Admitting: Cardiology

## 2022-08-30 DIAGNOSIS — I6523 Occlusion and stenosis of bilateral carotid arteries: Secondary | ICD-10-CM | POA: Insufficient documentation

## 2022-08-30 NOTE — Therapy (Deleted)
OUTPATIENT SPEECH LANGUAGE PATHOLOGY TREATMENT   Patient Name: Samantha Huffman MRN: [id number] DOB:07/01/1956, 67 y.o., female Today's Date: 08/30/2022  PCP: Collene Leyden, MD REFERRING PROVIDER: Collene Leyden, MD  END OF SESSION:     Past Medical History:  Diagnosis Date   Carotid artery stenosis    1-39% bilateral stenosis by dopplers 08/2020   Coronary artery disease    a. s/p CABG 10/2012.  cath 05/2016 with moderate LM stenosis, mild LCx and RCA stenosis and occluded LAD with patent LIMA to LAD, free radial to OM.  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   Dyslipidemia    Family history of adverse reaction to anesthesia    mother and sister ponv   Generalized convulsive epilepsy without mention of intractable epilepsy    GERD (gastroesophageal reflux disease)    History of kidney stones    Hypertension    Memory deficit    slight   Migraine    "controlled on daily RX " (06/11/2016)   Nephrolithiasis 07/30/2015   S/p lithotripsy x 3 and right and left ureter stents   OSA (obstructive sleep apnea)    cpap    PONV (postoperative nausea and vomiting)    Seizures ([place])    "controlled w/daily RX; started in 2012; dr thinks they might be from the migraines; last sz was early part of 2016" (06/11/2016)   Type II diabetes mellitus ([place])    metformin   Vasovagal syncope 07/30/2015   2011   Past Surgical History:  Procedure Laterality Date   CARDIAC CATHETERIZATION  2014; 06/11/2016   CARDIAC CATHETERIZATION N/A 06/11/2016   Procedure: Left Heart Cath and Cors/Grafts Angiography;  Surgeon: Sherren Mocha, MD;  Location: [facility];  Service: Cardiovascular;  Laterality: N/A;   CESAREAN SECTION     CORONARY ANGIOPLASTY     CORONARY ARTERY BYPASS GRAFT  March, 2014   LIMA to LAD, left radial to LCx x 2   CYSTOSCOPY W/ URETERAL STENT PLACEMENT Left 01/16/2019   Procedure: CYSTOSCOPY WITH RETROGRADE PYELOGRAM/URETERAL LEFT STENT PLACEMENT;  Surgeon: Ardis Hughs, MD;   Location: [facility];  Service: Urology;  Laterality: Left;   CYSTOSCOPY/URETEROSCOPY/HOLMIUM LASER/STENT PLACEMENT Left 01/24/2019   Procedure: CYSTOSCOPY, LEFT URETEROSCOPY, HOLMIUM LASER, STONE REMOVAL AND STENT EXCHANGE;  Surgeon: Ardis Hughs, MD;  Location: [facility];  Service: Urology;  Laterality: Left;   LITHOTRIPSY  X 3   RIGHT/LEFT HEART CATH AND CORONARY/GRAFT ANGIOGRAPHY N/A 09/08/2018   Procedure: RIGHT/LEFT HEART CATH AND CORONARY/GRAFT ANGIOGRAPHY;  Surgeon: Martinique, Peter M, MD;  Location: [facility];  Service: Cardiovascular;  Laterality: N/A;   TOTAL ABDOMINAL HYSTERECTOMY     ovaries took before hysterectomy   TUBAL LIGATION     URETERAL STENT PLACEMENT     Patient Active Problem List   Diagnosis Date Noted   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[place]) 03/09/2021   Cerebellar stroke ([place]) 01/30/2021   Acute cerebrovascular accident (CVA) ([place]) 01/20/2021   Acute CVA (cerebrovascular accident) ([place]) 01/20/2021   GERD (gastroesophageal reflux disease)    Coronary artery disease    Depression 01/06/2020   Pneumonia 01/05/2020   History of chest pain 09/26/2018   Dyspnea on exertion 09/08/2018   Chest pain, rule out acute myocardial infarction 10/30/2017   Edema 01/25/2017   Tobacco abuse 06/10/2016   Headache 09/15/2015   OSA (obstructive sleep apnea) 07/30/2015   Nephrolithiasis 07/30/2015   Vasovagal syncope 07/30/2015   Carotid artery stenosis, asymptomatic 07/30/2015   CAD (coronary  artery disease) 08/22/2013   Essential hypertension 08/22/2013   Diabetes mellitus without complication ([place])    Dyslipidemia    Seizures ([facility])    Generalized convulsive epilepsy ([place]) 03/22/2013   Memory deficit 03/22/2013    ONSET DATE: referral 07-28-2022   REFERRING DIAG: Z86.73 (ICD-10-CM) - History of stroke   THERAPY DIAG:  No diagnosis found.  Rationale for Evaluation and Treatment: Rehabilitation  SUBJECTIVE:   SUBJECTIVE STATEMENT: ***  PERTINENT HISTORY: May 2022  she had a right superior cerebellar, right PICA, left thalamic infarcts. Since the stroke, right sided weakness and decreased coordination. Describes as "shaky" movements.   PAIN:  Are you having pain? No  FALLS: Has patient fallen in last 6 months?  Yes, see PT eval  PATIENT GOALS: "I want to be able to talk the way I used to talk"   OBJECTIVE:   DIAGNOSTIC FINDINGS: 08/28/2021 MRI IMPRESSION: This MRI of the brain with and without contrast shows the following: 1.   Chronic infarctions involving the right superior cerebellar artery distribution with a smaller stroke in the distribution of one of the inferior cerebellar arteries.  The larger superior cerebellar artery distribution stroke shows gyriform enhancement.  Both of these strokes were acute on the 01/20/2021 MRI.  Although enhancement usually persist for a few weeks, occasionally this can be seen multiple months out. 2.   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  This was acute on the 01/20/2021 MRI. 3.   Scattered T2/FLAIR hyperintense foci in the hemispheres consistent with mild chronic microvascular ischemic changes. 4.   Brain volume is normal for age. 5.   No acute findings.  PATIENT REPORTED OUTCOME MEASURES (PROM): Deferred d/t time constraints  TODAY'S TREATMENT: 08-31-22: ***  08-25-22: SLP provides education on strategies to relieve time pressure, primarily planning ahead and allowing plenty of time to complete tasks required. During structured conversational exchange, following review of dysarthria strategies, pt 100% intelligible in 11/11 multi-sentence responses. Pt evidencing regulation of rate with rare instance of increased rate with pt self corrects in 90% of opportunities. SLP educated on strategies and compensations which may benefit her ability to read: reduce distractions, read in small chunks, summarize what you've read, use of spoken content as A, trial reading out loud. During conversation. Pt reveals she has  not read in years, even before stroke. Had difficulties with attention and memory going back to pre-open heart surgery. Pt is getting A from daughter to pay bills, as she was doing it 2x a month and has difficulty calling. Does not desire to resume bill pay. In response to questioning, pt tells SLP x3 activities in which she is modifying to still be able to complete. Discussion on potential d/c from ST next session, pt agreeable.   08-20-22: SLP provided education on strategies and compensations to aid in communication efficacy for the dysarthric speaker. Pt able to teach back through providing examples of opportunities in which she could employ discussed strategies. See handout. Use of BOSS acronym to aid in recall of speech strategies: breathe, over-articulate, speak up, slow down. Target use of at generative sentence level with 3 syllable words. Pt with intelligible production in 19/20 trials with rare min-A, employs syllabification with mod-I following initial instruction.   08-13-22: Education provided on evaluation results and SLP's recommendations. SLP provides education on therapy course will need to primarily focus on compensatory dysarthria strategies vs a return to pre-morbid speech. Pt verbalizes understanding. Collaborated with pt to generate POC and goals, with pt  verbalizing agreement with POC. Initiated training re: reduced rate with self-monitoring to aid in increased intelligibility. At conclusion of session pt denies questions.   PATIENT EDUCATION: Education details: see above Person educated: Patient Education method: Explanation, Demonstration, and Handouts Education comprehension: verbalized understanding, returned demonstration, and needs further education   GOALS: Goals reviewed with patient? Yes  SHORT TERM GOALS: Target date: 09-10-2022  Pt will verbalize and demonstrate 3 attention/memory strategies to aid functioning home with min A  Baseline: Goal status: INITIAL  2.   Pt will demonstrate dysarthria compensations during paragraph reading with occasional min-A over 2 sessions Baseline:  Goal status: MET  3.  Given visual cue, pt will modulate rate in 90% of opportunities during 10 minute conversation Baseline:  Goal status: MET  4.  During structured activity, pt will increased awareness of speech production through self monitoring for speech clarity (rate, pacing, breath support), evidenced by self rating using 5 pt scale which is equal to SLPs rating in 80% of trials  Baseline:  Goal status: INITIAL   LONG TERM GOALS: Target date: 10-08-2022  Pt will report completion of HEP 5/7 days over 1 week period Baseline:  Goal status: INITIAL  2.  Pt will self-monitor and adjust speech production to aid in clarity of speech during structured exercises with rare min-A from SLP Baseline:  Goal status: INITIAL  3.  Pt will recall details from simple to moderately complex reading following 5 minute latency period with use of compensations PRN Baseline:  Goal status: INITIAL  4.  Pt will evidence 98% intelligibility during 30 minute conversation with rare min-A Baseline:  Goal status: INITIAL  ASSESSMENT:  CLINICAL IMPRESSION: Patient is a 66 y.o. F who was seen today for cognitive linguistic evaluation at the request of her PCP. Ms. Southard presents with mild dysarthria which is primarily c/b variable rate and loudness and imprecise articulation. Pt is overall intelligible during today's evaluation (95%), however reports her speech clarity varies based on time of day and duration of speaking. Endorses decreased communication efficacy with family members, requiring usual repetitions and receiving comments on her speech. Tells SLP "it's really frustrating and I just get up and leave." Endorses deviation in attention and memory with need for care support from family members. Desires to trial strategies to enable ability to read books again, which she has not been  able to do since stroke in May 2022. SLP recommends skilled ST services to increased pt's communication efficacy and confidence in ability to express thoughts/ideas with decreased avoidance and frustration.   OBJECTIVE IMPAIRMENTS: include attention, memory, executive functioning, and dysarthria. These impairments are limiting patient from managing medications, managing appointments, managing finances, household responsibilities, ADLs/IADLs, and effectively communicating at home and in community. Factors affecting potential to achieve goals and functional outcome are cooperation/participation level, previous level of function, and family/community support. Patient will benefit from skilled SLP services to address above impairments and improve overall function.  REHAB POTENTIAL: Fair (prognosis)  PLAN:  SLP FREQUENCY: 1x/week  SLP DURATION: 8 weeks  PLANNED INTERVENTIONS: Language facilitation, Cueing hierachy, Cognitive reorganization, Internal/external aids, Functional tasks, SLP instruction and feedback, Compensatory strategies, and Patient/family education  SPEECH THERAPY DISCHARGE SUMMARY  Visits from Start of Care: ***  Current functional level related to goals / functional outcomes: ***   Remaining deficits: ***   Education / Equipment: ***   Patient agrees to discharge. Patient goals were {OP Goals:25702::"met"}. Patient is being discharged due to {OP Discharge Reasons:25703::"meeting the stated rehab goals."}.  Su Monks, [place] 08/30/2022, 11:41 AM

## 2022-08-31 ENCOUNTER — Encounter: Payer: Medicare HMO | Admitting: Speech Pathology

## 2022-08-31 ENCOUNTER — Encounter: Payer: Self-pay | Admitting: Cardiology

## 2022-08-31 ENCOUNTER — Ambulatory Visit: Payer: Medicare HMO | Admitting: Physical Therapy

## 2022-09-01 ENCOUNTER — Telehealth: Payer: Self-pay

## 2022-09-01 NOTE — Telephone Encounter (Signed)
-----   Message from Nuala Alpha, LPN sent at 0/30/0923  8:18 AM EST -----  ----- Message ----- From: Sueanne Margarita, MD Sent: 08/31/2022   2:55 PM EST To: Collene Leyden, MD; [facility] Triage  1-39% bilateral carotid stenosis - repeat dopplers in 2 years

## 2022-09-01 NOTE — Telephone Encounter (Signed)
LMTCB 09/01/22.

## 2022-09-03 NOTE — Telephone Encounter (Signed)
Results of Carotid dopplers reviewed with daughter Tilda Franco (dpr) who verbalizes understanding. 2 year Recall scheduled for repeat carotid dopplers.

## 2022-09-03 NOTE — Telephone Encounter (Signed)
Daughter returned RN's call.  Daughter stated she will not be available until after 1:30 pm today.

## 2022-09-07 ENCOUNTER — Telehealth: Payer: Self-pay | Admitting: Physical Therapy

## 2022-09-07 ENCOUNTER — Ambulatory Visit: Payer: Medicare HMO | Admitting: Speech Pathology

## 2022-09-07 ENCOUNTER — Ambulatory Visit: Payer: Medicare HMO | Admitting: Physical Therapy

## 2022-09-07 ENCOUNTER — Encounter: Payer: Self-pay | Admitting: Physical Therapy

## 2022-09-07 VITALS — BP 139/73 | HR 73

## 2022-09-07 DIAGNOSIS — R269 Unspecified abnormalities of gait and mobility: Secondary | ICD-10-CM

## 2022-09-07 DIAGNOSIS — M6281 Muscle weakness (generalized): Secondary | ICD-10-CM

## 2022-09-07 DIAGNOSIS — R2681 Unsteadiness on feet: Secondary | ICD-10-CM

## 2022-09-07 DIAGNOSIS — R471 Dysarthria and anarthria: Secondary | ICD-10-CM

## 2022-09-07 DIAGNOSIS — R278 Other lack of coordination: Secondary | ICD-10-CM

## 2022-09-07 NOTE — Therapy (Signed)
OUTPATIENT PHYSICAL THERAPY NEURO TREATMENT   Patient Name: Samantha Huffman MRN: [id number] DOB:1956/01/04, 67 y.o., female Today's Date: 09/07/2022   PCP: Samantha Leyden, MD REFERRING PROVIDER: Collene Leyden, MD  END OF SESSION:  PT End of Session - 09/07/22 0940     Visit Number 4    Number of Visits 7    Date for PT Re-Evaluation 09/24/22    Authorization Type [employer]    Progress Note Due on Visit 10    PT Start Time 0939    PT Stop Time 1015   treatment time shortened due to patinet arrival time/confusion with speech/OT visits   PT Time Calculation (min) 36 min    Equipment Utilized During Treatment Gait belt    Activity Tolerance Patient tolerated treatment well    Behavior During Therapy Samantha Huffman for tasks assessed/performed             Past Medical History:  Diagnosis Date   Carotid artery stenosis    1-39% bilateral stenosis by dopplers 08/2022   Coronary artery disease    a. s/p CABG 10/2012.  cath 05/2016 with moderate LM stenosis, mild LCx and RCA stenosis and occluded LAD with patent LIMA to LAD, free radial to OM.  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   Dyslipidemia    Family history of adverse reaction to anesthesia    mother and sister ponv   Generalized convulsive epilepsy without mention of intractable epilepsy    GERD (gastroesophageal reflux disease)    History of kidney stones    Hypertension    Memory deficit    slight   Migraine    "controlled on daily RX " (06/11/2016)   Nephrolithiasis 07/30/2015   S/p lithotripsy x 3 and right and left ureter stents   OSA (obstructive sleep apnea)    cpap    PONV (postoperative nausea and vomiting)    Seizures (Samantha Huffman)    "controlled w/daily RX; started in 2012; dr thinks they might be from the migraines; last sz was early part of 2016" (06/11/2016)   Type II diabetes mellitus ([place])    metformin   Vasovagal syncope 07/30/2015   2011   Past Surgical History:  Procedure Laterality Date   CARDIAC  CATHETERIZATION  2014; 06/11/2016   CARDIAC CATHETERIZATION N/A 06/11/2016   Procedure: Left Heart Cath and Cors/Grafts Angiography;  Surgeon: Samantha Mocha, MD;  Location: [facility];  Service: Cardiovascular;  Laterality: N/A;   CESAREAN SECTION     CORONARY ANGIOPLASTY     CORONARY ARTERY BYPASS GRAFT  March, 2014   LIMA to LAD, left radial to LCx x 2   CYSTOSCOPY W/ URETERAL STENT PLACEMENT Left 01/16/2019   Procedure: CYSTOSCOPY WITH RETROGRADE PYELOGRAM/URETERAL LEFT STENT PLACEMENT;  Surgeon: Samantha Hughs, MD;  Location: [facility];  Service: Urology;  Laterality: Left;   CYSTOSCOPY/URETEROSCOPY/HOLMIUM LASER/STENT PLACEMENT Left 01/24/2019   Procedure: CYSTOSCOPY, LEFT URETEROSCOPY, HOLMIUM LASER, STONE REMOVAL AND STENT EXCHANGE;  Surgeon: Samantha Hughs, MD;  Location: [facility];  Service: Urology;  Laterality: Left;   LITHOTRIPSY  X 3   RIGHT/LEFT HEART CATH AND CORONARY/GRAFT ANGIOGRAPHY N/A 09/08/2018   Procedure: RIGHT/LEFT HEART CATH AND CORONARY/GRAFT ANGIOGRAPHY;  Surgeon: Huffman, Samantha M, MD;  Location: [facility];  Service: Cardiovascular;  Laterality: N/A;   TOTAL ABDOMINAL HYSTERECTOMY     ovaries took before hysterectomy   TUBAL LIGATION     URETERAL STENT PLACEMENT     Patient Active Problem List  Diagnosis Date Noted   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[place]) 03/09/2021   Cerebellar stroke ([place]) 01/30/2021   Acute cerebrovascular accident (CVA) ([place]) 01/20/2021   Acute CVA (cerebrovascular accident) ([place]) 01/20/2021   GERD (gastroesophageal reflux disease)    Coronary artery disease    Depression 01/06/2020   Pneumonia 01/05/2020   History of chest pain 09/26/2018   Dyspnea on exertion 09/08/2018   Chest pain, rule out acute myocardial infarction 10/30/2017   Edema 01/25/2017   Tobacco abuse 06/10/2016   Headache 09/15/2015   OSA (obstructive sleep apnea) 07/30/2015   Nephrolithiasis 07/30/2015   Vasovagal syncope 07/30/2015   Carotid artery  stenosis, asymptomatic 07/30/2015   CAD (coronary artery disease) 08/22/2013   Essential hypertension 08/22/2013   Diabetes mellitus without complication ([place])    Dyslipidemia    Seizures ([place])    Generalized convulsive epilepsy ([place]) 03/22/2013   Memory deficit 03/22/2013    ONSET DATE: 07/28/2022 (referral date)  REFERRING DIAG: Z86.73 (ICD-10-CM) - History of stroke  THERAPY DIAG:  Abnormality of gait and mobility  Unsteadiness on feet  Other lack of coordination  Muscle weakness (generalized)  Rationale for Evaluation and Treatment: Rehabilitation  SUBJECTIVE:                                                                                                                                                                                             SUBJECTIVE STATEMENT: Patient reports that she is doing well. Denies falls or near falls. Arrives late to session as she confused speech and physical therapy visit times and patient thought she had discharged from speech; able to rework schedule to fit her in and speech informed therapist that patient was supposed to D/C form therapy today but can do self-discharge based on last findings. Patient reports that she did lots of walking this weekend in [place] while shopping with her daughter. She also reports that her R leg is more stiff today due to the weather.   Pt accompanied by: self  PERTINENT HISTORY:   Per neurology note: 10/06/21 SS: Samantha Huffman is here today for follow-up, seen by Dr. Leonie Huffman in November 2022 for memory.  In May 2022 she had a right superior cerebellar, right PICA, left thalamic infarcts. Stopped Keppra after daughters claimed she didn't have active rx on file. Last seizure was in 2017, with seizure, starts with headache, then stares off, then stares off. Tapered off Topamax.  B12 was slightly low at 227.  EEG was normal.  MRI of the brain showed expected evolutionary changes, no new findings.  Is on aspirin and Plavix.  MMSE 25/30 today. Since the stroke,  right sided weakness, using cane or walker, feels speech speed increases when can't get the words out. Living alone, isn't driving. Memory is so-so. Has given up reading due to memory. No significant headaches. Forgets where she puts things. Has been living alone since end of last year, daughters feel she has done well. Does her own ADLs, keeps the house up. Is on B 12 supplement.   PAIN:  Are you having pain? No  PRECAUTIONS: Fall  WEIGHT BEARING RESTRICTIONS: No  OBJECTIVE:   TODAY'S TREATMENT:                                                                                                                              LUE in sitting prior to session: Today's Vitals   09/07/22 0943  BP: 139/73  Pulse: 73   NMR:  [facility] PT Assessment - 09/07/22 0001       Functional Gait  Assessment   Gait assessed  Yes    Gait Level Surface Walks 20 ft in less than 7 sec but greater than 5.5 sec, uses assistive device, slower speed, mild gait deviations, or deviates 6-10 in outside of the 12 in walkway width.    Change in Gait Speed Able to smoothly change walking speed without loss of balance or gait deviation. Deviate no more than 6 in outside of the 12 in walkway width.    Gait with Horizontal Head Turns Performs head turns smoothly with slight change in gait velocity (eg, minor disruption to smooth gait path), deviates 6-10 in outside 12 in walkway width, or uses an assistive device.    Gait with Vertical Head Turns Performs task with slight change in gait velocity (eg, minor disruption to smooth gait path), deviates 6 - 10 in outside 12 in walkway width or uses assistive device    Gait and Pivot Turn Pivot turns safely within 3 sec and stops quickly with no loss of balance.    Step Over Obstacle Is able to step over one shoe box (4.5 in total height) but must slow down and adjust steps to clear box safely. May require verbal cueing.    Gait with Narrow Base of  Support Ambulates less than 4 steps heel to toe or cannot perform without assistance.    Gait with Eyes Closed Cannot walk 20 ft without assistance, severe gait deviations or imbalance, deviates greater than 15 in outside 12 in walkway width or will not attempt task.    Ambulating Backwards Walks 20 ft, slow speed, abnormal gait pattern, evidence for imbalance, deviates 10-15 in outside 12 in walkway width.    Steps Two feet to a stair, must use rail.    Total Score 15             -Obstacle course using cones, 6" hurdle x 3 for step overs forward/lateral/arounds w/ augmented feedback progressing from 6 rounds w/ cane to no cane SBA-CGA. - Hurdle taps for coordination 1  x 10 bil, 1 x 10 bil alt (CGA)  PATIENT EDUCATION: Education details: Continue HEP and today's findings Person educated: Patient Education method: Explanation Education comprehension: verbalized understanding and needs further education  HOME EXERCISE PROGRAM: Access Code: [email or web address] URL: [email or web address] Date: 08/20/2022 Prepared by: Elease Etienne  Exercises - Walking with Eyes Closed and Counter Support  - 1 x daily - 5 x weekly - 3 sets - 10 reps - Walking with Head Nod  - 1 x daily - 5 x weekly - 3 sets - 10 reps - Standing Single Leg Stance with Counter Support  - 1 x daily - 5 x weekly - 1 sets - 3-4 reps - 20-30 seconds hold - Staggered Sit-to-Stand  - 1 x daily - 5 x weekly - 2 sets - 10 reps  You Can Walk For A Certain Length Of Time Each Day                          Walk 5 minutes 2-3 times per day.             Increase 2-3  minutes every 7 days              Work up to 20 minutes (1-2 times per day).               Example:                         Day 1-2           4-5 minutes     3 times per day                         Day 7-8           10-12 minutes 2-3 times per day                         Day 13-14       20-22 minutes 1-2 times per day   GOALS: Goals reviewed with patient?  Yes  SHORT TERM GOALS: Target date: 09/03/2022  Patient will demonstrate 100% compliance with initial HEP to continue to progress between physical therapy sessions.   Baseline: Provided; increasing walking and completing HEP 90% Goal status: IN PROGRESS   2.  Patient will improve ABC Score to 67% or greater to indicate a decreased risk for falls and improved self-reported confidence in balance and sense of steadiness.   Baseline: Initial 62.5%; 65% on 09/07/2022 Goal status: IN PROGRESS  3.  Patient will improve FGA score to 18/30 to indicate progress towards a decreased risk of falls and improved dynamic stability.   Baseline: 15/30; 15/30 on 09/07/2022 Goal status: IN PROGRESS   LONG TERM GOALS: Target date: 09/24/2022  Patient will report demonstrate independence with final HEP in order to maintain current gains and continue to progress after physical therapy discharge.   Baseline: To be provided  Goal status: INITIAL  2.  Patient will improve gait speed to 1.1 m/s with LRAD or greater to indicate a reduced risk for falls.   Baseline: 0.9 m/s Goal status: INITIAL  3.  Patient will improve ABC Score to 70% or greater to indicate a decreased risk for falls and improved self-reported confidence in balance and sense of steadiness.   Baseline: 62.5%; 65% on 09/07/2022 Goal status: REVISED  5.  Patient will improve FGA to greater than 20/30 to indicate progress towards decreased risk of falls and improved dynamic stability.   Baseline: 15/30; 15/30 on 09/07/2022 Goal status: REVISED  ASSESSMENT:  CLINICAL IMPRESSION: Session initiated with progress towards short term goals, progressing well with HEP reporting 90% compliance and ABC score improvement to 65%. Though patient still at increased risk for falls is demonstrating good progress towards goals. Patient demonstrated no change in FGA though improved in some component parts likely due to increase LE stiffness noted in today's session.  Patient demonstrated appropriate obstacle navigation both with and without cane in today's session but greater difficulty with lateral stepping. Will continue to address these deficits and safety awareness in coming visits.  OBJECTIVE IMPAIRMENTS: Abnormal gait, decreased balance, decreased cognition, decreased coordination, decreased knowledge of use of DME, difficulty walking, decreased strength, and decreased safety awareness.   ACTIVITY LIMITATIONS: carrying, bending, standing, and stairs  PARTICIPATION LIMITATIONS: driving and shopping  PERSONAL FACTORS: Age, Past/current experiences, and 1 comorbidity: see PMH for details  are also affecting patient's functional outcome.   REHAB POTENTIAL: Good  CLINICAL DECISION MAKING: Evolving/moderate complexity  EVALUATION COMPLEXITY: Moderate  PLAN:  PT FREQUENCY: 1x/week  PT DURATION: 6 weeks  PLANNED INTERVENTIONS: Therapeutic exercises, Therapeutic activity, Neuromuscular re-education, Balance training, Gait training, Patient/Family education, Self Care, Joint mobilization, Stair training, and Manual therapy  PLAN FOR NEXT SESSION: Monitor L BP.  Modify HEP prn, stair training w/ vs w/o cane, dynamic gait tasks, static balance in tandem, dual task?, obstacle management w/ cane (over and around); consider recert/ adding more visits to [place], PT, DPT 09/07/2022, 12:14 PM

## 2022-09-07 NOTE — Therapy (Deleted)
OUTPATIENT SPEECH LANGUAGE PATHOLOGY TREATMENT   Patient Name: Samantha Huffman MRN: [id number] DOB:[birth year], 67 y.o., female Today's Date: 09/07/2022  PCP: Collene Leyden, MD REFERRING PROVIDER: Collene Leyden, MD  END OF SESSION:     Past Medical History:  Diagnosis Date   Carotid artery stenosis    1-39% bilateral stenosis by dopplers 08/2022   Coronary artery disease    a. s/p CABG 10/2012.  cath 05/2016 with moderate LM stenosis, mild LCx and RCA stenosis and occluded LAD with patent LIMA to LAD, free radial to OM.  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   Dyslipidemia    Family history of adverse reaction to anesthesia    mother and sister ponv   Generalized convulsive epilepsy without mention of intractable epilepsy    GERD (gastroesophageal reflux disease)    History of kidney stones    Hypertension    Memory deficit    slight   Migraine    "controlled on daily RX " (06/11/2016)   Nephrolithiasis 07/30/2015   S/p lithotripsy x 3 and right and left ureter stents   OSA (obstructive sleep apnea)    cpap    PONV (postoperative nausea and vomiting)    Seizures ([place])    "controlled w/daily RX; started in 2012; dr thinks they might be from the migraines; last sz was early part of 2016" (06/11/2016)   Type II diabetes mellitus ([place])    metformin   Vasovagal syncope 07/30/2015   2011   Past Surgical History:  Procedure Laterality Date   CARDIAC CATHETERIZATION  2014; 06/11/2016   CARDIAC CATHETERIZATION N/A 06/11/2016   Procedure: Left Heart Cath and Cors/Grafts Angiography;  Surgeon: Sherren Mocha, MD;  Location: [facility];  Service: Cardiovascular;  Laterality: N/A;   CESAREAN SECTION     CORONARY ANGIOPLASTY     CORONARY ARTERY BYPASS GRAFT  March, 2014   LIMA to LAD, left radial to LCx x 2   CYSTOSCOPY W/ URETERAL STENT PLACEMENT Left 01/16/2019   Procedure: CYSTOSCOPY WITH RETROGRADE PYELOGRAM/URETERAL LEFT STENT PLACEMENT;  Surgeon: Ardis Hughs,  MD;  [facility];  Service: Urology;  Laterality: Left;   CYSTOSCOPY/URETEROSCOPY/HOLMIUM LASER/STENT PLACEMENT Left 01/24/2019   Procedure: CYSTOSCOPY, LEFT URETEROSCOPY, HOLMIUM LASER, STONE REMOVAL AND STENT EXCHANGE;  Surgeon: Ardis Hughs, MD;  [facility];  Service: Urology;  Laterality: Left;   LITHOTRIPSY  X 3   RIGHT/LEFT HEART CATH AND CORONARY/GRAFT ANGIOGRAPHY N/A 09/08/2018   Procedure: RIGHT/LEFT HEART CATH AND CORONARY/GRAFT ANGIOGRAPHY;  Surgeon: [place], Peter M, MD;  Location: [facility];  Service: Cardiovascular;  Laterality: N/A;   TOTAL ABDOMINAL HYSTERECTOMY     ovaries took before hysterectomy   TUBAL LIGATION     URETERAL STENT PLACEMENT     Patient Active Problem List   Diagnosis Date Noted   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[place]) 03/09/2021   Cerebellar stroke ([place]) 01/30/2021   Acute cerebrovascular accident (CVA) ([place]) 01/20/2021   Acute CVA (cerebrovascular accident) ([place]) 01/20/2021   GERD (gastroesophageal reflux disease)    Coronary artery disease    Depression 01/06/2020   Pneumonia 01/05/2020   History of chest pain 09/26/2018   Dyspnea on exertion 09/08/2018   Chest pain, rule out acute myocardial infarction 10/30/2017   Edema 01/25/2017   Tobacco abuse 06/10/2016   Headache 09/15/2015   OSA (obstructive sleep apnea) 07/30/2015   Nephrolithiasis 07/30/2015   Vasovagal syncope 07/30/2015   Carotid artery stenosis, asymptomatic 07/30/2015   CAD (coronary  artery disease) 08/22/2013   Essential hypertension 08/22/2013   Diabetes mellitus without complication ([place])    Dyslipidemia    Seizures ([facility])    Generalized convulsive epilepsy ([place]) 03/22/2013   Memory deficit 03/22/2013    ONSET DATE: referral 07-28-2022   REFERRING DIAG: Z86.73 (ICD-10-CM) - History of stroke   THERAPY DIAG:  No diagnosis found.  Rationale for Evaluation and Treatment: Rehabilitation  SUBJECTIVE:   SUBJECTIVE STATEMENT: ***   PERTINENT HISTORY: May  2022 she had a right superior cerebellar, right PICA, left thalamic infarcts. Since the stroke, right sided weakness and decreased coordination. Describes as "shaky" movements.   PAIN:  Are you having pain? No  FALLS: Has patient fallen in last 6 months?  Yes, see PT eval  PATIENT GOALS: "I want to be able to talk the way I used to talk"   OBJECTIVE:   DIAGNOSTIC FINDINGS: 08/28/2021 MRI IMPRESSION: This MRI of the brain with and without contrast shows the following: 1.   Chronic infarctions involving the right superior cerebellar artery distribution with a smaller stroke in the distribution of one of the inferior cerebellar arteries.  The larger superior cerebellar artery distribution stroke shows gyriform enhancement.  Both of these strokes were acute on the 01/20/2021 MRI.  Although enhancement usually persist for a few weeks, occasionally this can be seen multiple months out. 2.   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  This was acute on the 01/20/2021 MRI. 3.   Scattered T2/FLAIR hyperintense foci in the hemispheres consistent with mild chronic microvascular ischemic changes. 4.   Brain volume is normal for age. 5.   No acute findings.  PATIENT REPORTED OUTCOME MEASURES (PROM): Deferred d/t time constraints  TODAY'S TREATMENT: 09-07-22:  08-25-22: SLP provides education on strategies to relieve time pressure, primarily planning ahead and allowing plenty of time to complete tasks required. During structured conversational exchange, following review of dysarthria strategies, pt 100% intelligible in 11/11 multi-sentence responses. Pt evidencing regulation of rate with rare instance of increased rate with pt self corrects in 90% of opportunities. SLP educated on strategies and compensations which may benefit her ability to read: reduce distractions, read in small chunks, summarize what you've read, use of spoken content as A, trial reading out loud. During conversation. Pt reveals she has  not read in years, even before stroke. Had difficulties with attention and memory going back to pre-open heart surgery. Pt is getting A from daughter to pay bills, as she was doing it 2x a month and has difficulty calling. Does not desire to resume bill pay. In response to questioning, pt tells SLP x3 activities in which she is modifying to still be able to complete. Discussion on potential d/c from ST next session, pt agreeable.   08-20-22: SLP provided education on strategies and compensations to aid in communication efficacy for the dysarthric speaker. Pt able to teach back through providing examples of opportunities in which she could employ discussed strategies. See handout. Use of BOSS acronym to aid in recall of speech strategies: breathe, over-articulate, speak up, slow down. Target use of at generative sentence level with 3 syllable words. Pt with intelligible production in 19/20 trials with rare min-A, employs syllabification with mod-I following initial instruction.   08-13-22: Education provided on evaluation results and SLP's recommendations. SLP provides education on therapy course will need to primarily focus on compensatory dysarthria strategies vs a return to pre-morbid speech. Pt verbalizes understanding. Collaborated with pt to generate POC and goals, with pt  verbalizing agreement with POC. Initiated training re: reduced rate with self-monitoring to aid in increased intelligibility. At conclusion of session pt denies questions.   PATIENT EDUCATION: Education details: see above Person educated: Patient Education method: Explanation, Demonstration, and Handouts Education comprehension: verbalized understanding, returned demonstration, and needs further education   GOALS: Goals reviewed with patient? Yes  SHORT TERM GOALS: Target date: 09-10-2022  Pt will verbalize and demonstrate 3 attention/memory strategies to aid functioning home with min A  Baseline: Goal status: INITIAL  2.   Pt will demonstrate dysarthria compensations during paragraph reading with occasional min-A over 2 sessions Baseline:  Goal status: MET  3.  Given visual cue, pt will modulate rate in 90% of opportunities during 10 minute conversation Baseline:  Goal status: MET  4.  During structured activity, pt will increased awareness of speech production through self monitoring for speech clarity (rate, pacing, breath support), evidenced by self rating using 5 pt scale which is equal to SLPs rating in 80% of trials  Baseline:  Goal status: INITIAL   LONG TERM GOALS: Target date: 10-08-2022  Pt will report completion of HEP 5/7 days over 1 week period Baseline:  Goal status: INITIAL  2.  Pt will self-monitor and adjust speech production to aid in clarity of speech during structured exercises with rare min-A from SLP Baseline:  Goal status: INITIAL  3.  Pt will recall details from simple to moderately complex reading following 5 minute latency period with use of compensations PRN Baseline:  Goal status: INITIAL  4.  Pt will evidence 98% intelligibility during 30 minute conversation with rare min-A Baseline:  Goal status: INITIAL  ASSESSMENT:  CLINICAL IMPRESSION: Patient is a 67 y.o. F who was seen today for cognitive linguistic evaluation at the request of her PCP. Ms. Walp presents with mild dysarthria which is primarily c/b variable rate and loudness and imprecise articulation. Pt is overall intelligible during today's evaluation (95%), however reports her speech clarity varies based on time of day and duration of speaking. Endorses decreased communication efficacy with family members, requiring usual repetitions and receiving comments on her speech. Tells SLP "it's really frustrating and I just get up and leave." Endorses deviation in attention and memory with need for care support from family members. Desires to trial strategies to enable ability to read books again, which she has not been  able to do since stroke in May 2022. SLP recommends skilled ST services to increased pt's communication efficacy and confidence in ability to express thoughts/ideas with decreased avoidance and frustration.   OBJECTIVE IMPAIRMENTS: include attention, memory, executive functioning, and dysarthria. These impairments are limiting patient from managing medications, managing appointments, managing finances, household responsibilities, ADLs/IADLs, and effectively communicating at home and in community. Factors affecting potential to achieve goals and functional outcome are cooperation/participation level, previous level of function, and family/community support. Patient will benefit from skilled SLP services to address above impairments and improve overall function.  REHAB POTENTIAL: Fair (prognosis)  PLAN:  SLP FREQUENCY: 1x/week  SLP DURATION: 8 weeks  PLANNED INTERVENTIONS: Language facilitation, Cueing hierachy, Cognitive reorganization, Internal/external aids, Functional tasks, SLP instruction and feedback, Compensatory strategies, and Patient/family education   Leroy Libman, Student-SLP 09/07/2022, 9:05 AM

## 2022-09-07 NOTE — Telephone Encounter (Signed)
Called patient's daughter who was mobile phone listed on file. Patient was initially listed as a no show for today's appointment. Reminded family of 3 cancellation/no show policy. Daughter expressed strong concern in policy as family is juggling many appointments and was unsure why her mother had missed today and prior cancellation had been completed by rehab staff due to weather and a therapist being out. Therapist assured family member that this cancellation would not be counted against patient given circumstances. Family member verbalized understanding.  Patient later arrived late to clinic confusing speech and physical therapy appointment times. Patient was able to be seen for physical therapy session and so will no longer be marked as no show for physical therapy. Called daughter back and LVM to give her update.  Malachi Carl, PT, DPT

## 2022-09-07 NOTE — Therapy (Signed)
[facility] [address] [place], [place], [address] Phone: [phone]   Fax:  [phone]  Patient Details  Name: Samantha Huffman MRN: [id number] Date of Birth: 31-Aug-1955 Referring Provider:  No ref. provider found  Encounter Date: 09/07/2022 SPEECH THERAPY DISCHARGE SUMMARY  Visits from Start of Care: 3  Current functional level related to goals / functional outcomes: Demonstrating improved intelligibility through employment of dysarthria strategies during structured practice. Pt reports carryover to conversations at home.   Remaining deficits: Dysarthria   Education / Equipment: Dysarthria strategies and compensations. HEP  Patient agrees to discharge. Patient goals were met. Patient is being discharged due to meeting the stated rehab goals.Marland Kitchen  GOALS: Goals reviewed with patient? Yes   SHORT TERM GOALS: Target date: 09-10-2022   Pt will verbalize and demonstrate 3 attention/memory strategies to aid functioning home with min A  Baseline: Goal status: DEFERRED   2.  Pt will demonstrate dysarthria compensations during paragraph reading with occasional min-A over 2 sessions Baseline:  Goal status: MET   3.  Given visual cue, pt will modulate rate in 90% of opportunities during 10 minute conversation Baseline:  Goal status: MET   4.  During structured activity, pt will increased awareness of speech production through self monitoring for speech clarity (rate, pacing, breath support), evidenced by self rating using 5 pt scale which is equal to SLPs rating in 80% of trials  Baseline:  Goal status: MET     LONG TERM GOALS: Target date: 10-08-2022   Pt will report completion of HEP 5/7 days over 1 week period Baseline:  Goal status: MET   2.  Pt will self-monitor and adjust speech production to aid in clarity of speech during structured exercises with rare min-A from SLP Baseline:  Goal status: MET   3.  Pt  will recall details from simple to moderately complex reading following 5 minute latency period with use of compensations PRN Baseline:  Goal status: DEFERRED upon further evaluation, pt reveals reading deficits preceding stroke   4.  Pt will evidence 98% intelligibility during 30 minute conversation with rare min-A Baseline:  Goal status: MET    Leroy Libman, Student-SLP 09/07/2022, 9:38 AM  [facility] [address] [place], [place], [address] Phone: [phone]   Fax:  [phone]

## 2022-09-14 ENCOUNTER — Encounter: Payer: Medicare HMO | Admitting: Speech Pathology

## 2022-09-14 ENCOUNTER — Telehealth: Payer: Self-pay | Admitting: *Deleted

## 2022-09-14 ENCOUNTER — Encounter: Payer: Self-pay | Admitting: Physical Therapy

## 2022-09-14 ENCOUNTER — Ambulatory Visit: Payer: Medicare HMO | Admitting: Physical Therapy

## 2022-09-14 VITALS — BP 118/71 | HR 81

## 2022-09-14 DIAGNOSIS — M6281 Muscle weakness (generalized): Secondary | ICD-10-CM

## 2022-09-14 DIAGNOSIS — R2681 Unsteadiness on feet: Secondary | ICD-10-CM

## 2022-09-14 DIAGNOSIS — I6523 Occlusion and stenosis of bilateral carotid arteries: Secondary | ICD-10-CM

## 2022-09-14 DIAGNOSIS — R269 Unspecified abnormalities of gait and mobility: Secondary | ICD-10-CM

## 2022-09-14 DIAGNOSIS — R471 Dysarthria and anarthria: Secondary | ICD-10-CM | POA: Diagnosis not present

## 2022-09-14 DIAGNOSIS — R278 Other lack of coordination: Secondary | ICD-10-CM

## 2022-09-14 NOTE — Telephone Encounter (Signed)
The patients daughter [name] (on [place])  has been notified of the result and verbalized understanding.  All questions (if any) were answered.  [name] is aware that I will go ahead and place the order for repeat carotids in 2 years in the system and have our [facility] reach out to them closer to that time frame, to arrange that appt.   Keya verbalized understanding and agrees with this plan.

## 2022-09-14 NOTE — Telephone Encounter (Signed)
-----  Message from Nuala Alpha, LPN sent at 02/11/2978  8:18 AM EST -----  ----- Message ----- From: Sueanne Margarita, MD Sent: 08/31/2022   2:55 PM EST To: Collene Leyden, MD; C[facility] Triage  1-39% bilateral carotid stenosis - repeat dopplers in 2 years

## 2022-09-14 NOTE — Therapy (Signed)
OUTPATIENT PHYSICAL THERAPY NEURO TREATMENT   Patient Name: Samantha Huffman MRN: [id number] DOB:Mar 12, 1956, 67 y.o., female Today's Date: 09/14/2022   PCP: Samantha Leyden, MD REFERRING PROVIDER: Collene Leyden, MD  END OF SESSION:  PT End of Session - 09/14/22 0849     Visit Number 5    Number of Visits 7    Date for PT Re-Evaluation 09/24/22    Authorization Type [employer]    Progress Note Due on Visit 10    PT Start Time [phone]    PT Stop Time 0930    PT Time Calculation (min) 43 min    Equipment Utilized During Treatment Gait belt    Activity Tolerance Patient tolerated treatment well    Behavior During Therapy [facility] for tasks assessed/performed             Past Medical History:  Diagnosis Date   Carotid artery stenosis    1-39% bilateral stenosis by dopplers 08/2022   Coronary artery disease    a. s/p CABG 10/2012.  cath 05/2016 with moderate LM stenosis, mild LCx and RCA stenosis and occluded LAD with patent LIMA to LAD, free radial to OM.  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   Dyslipidemia    Family history of adverse reaction to anesthesia    mother and sister ponv   Generalized convulsive epilepsy without mention of intractable epilepsy    GERD (gastroesophageal reflux disease)    History of kidney stones    Hypertension    Memory deficit    slight   Migraine    "controlled on daily RX " (06/11/2016)   Nephrolithiasis 07/30/2015   S/p lithotripsy x 3 and right and left ureter stents   OSA (obstructive sleep apnea)    cpap    PONV (postoperative nausea and vomiting)    Seizures ([place])    "controlled w/daily RX; started in 2012; dr thinks they might be from the migraines; last sz was early part of 2016" (06/11/2016)   Type II diabetes mellitus ([place])    metformin   Vasovagal syncope 07/30/2015   2011   Past Surgical History:  Procedure Laterality Date   CARDIAC CATHETERIZATION  2014; 06/11/2016   CARDIAC CATHETERIZATION N/A 06/11/2016   Procedure: Left  Heart Cath and Cors/Grafts Angiography;  Surgeon: Samantha Mocha, MD;  Location: [facility];  Service: Cardiovascular;  Laterality: N/A;   CESAREAN SECTION     CORONARY ANGIOPLASTY     CORONARY ARTERY BYPASS GRAFT  March, 2014   LIMA to LAD, left radial to LCx x 2   CYSTOSCOPY W/ URETERAL STENT PLACEMENT Left 01/16/2019   Procedure: CYSTOSCOPY WITH RETROGRADE PYELOGRAM/URETERAL LEFT STENT PLACEMENT;  Surgeon: Samantha Hughs, MD;  Location: [facility];  Service: Urology;  Laterality: Left;   CYSTOSCOPY/URETEROSCOPY/HOLMIUM LASER/STENT PLACEMENT Left 01/24/2019   Procedure: CYSTOSCOPY, LEFT URETEROSCOPY, HOLMIUM LASER, STONE REMOVAL AND STENT EXCHANGE;  Surgeon: Samantha Hughs, MD;  Location: [facility];  Service: Urology;  Laterality: Left;   LITHOTRIPSY  X 3   RIGHT/LEFT HEART CATH AND CORONARY/GRAFT ANGIOGRAPHY N/A 09/08/2018   Procedure: RIGHT/LEFT HEART CATH AND CORONARY/GRAFT ANGIOGRAPHY;  Surgeon: Huffman, Samantha M, MD;  Location: [facility];  Service: Cardiovascular;  Laterality: N/A;   TOTAL ABDOMINAL HYSTERECTOMY     ovaries took before hysterectomy   TUBAL LIGATION     URETERAL STENT PLACEMENT     Patient Active Problem List   Diagnosis Date Noted   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[place]) 03/09/2021  Cerebellar stroke ([place]) 01/30/2021   Acute cerebrovascular accident (CVA) ([place]) 01/20/2021   Acute CVA (cerebrovascular accident) ([place]) 01/20/2021   GERD (gastroesophageal reflux disease)    Coronary artery disease    Depression 01/06/2020   Pneumonia 01/05/2020   History of chest pain 09/26/2018   Dyspnea on exertion 09/08/2018   Chest pain, rule out acute myocardial infarction 10/30/2017   Edema 01/25/2017   Tobacco abuse 06/10/2016   Headache 09/15/2015   OSA (obstructive sleep apnea) 07/30/2015   Nephrolithiasis 07/30/2015   Vasovagal syncope 07/30/2015   Carotid artery stenosis, asymptomatic 07/30/2015   CAD (coronary artery disease) 08/22/2013   Essential  hypertension 08/22/2013   Diabetes mellitus without complication ([place])    Dyslipidemia    Seizures ([place])    Generalized convulsive epilepsy ([place]) 03/22/2013   Memory deficit 03/22/2013    ONSET DATE: 07/28/2022 (referral date)  REFERRING DIAG: Z86.73 (ICD-10-CM) - History of stroke  THERAPY DIAG:  Abnormality of gait and mobility  Unsteadiness on feet  Other lack of coordination  Muscle weakness (generalized)  Rationale for Evaluation and Treatment: Rehabilitation  SUBJECTIVE:                                                                                                                                                                                             SUBJECTIVE STATEMENT: Patient reports that she is doing well. Patient reports exercises are going fine. She reports that she continues to notice increased stiffness in her R leg with the weather. She wants to work on keeping her right leg from turning out but is unsure if it will be able to be changed.   Pt accompanied by: self  PERTINENT HISTORY:   Per neurology note: 10/06/21 SS: Samantha Huffman is here today for follow-up, seen by Dr. Leonie Huffman in November 2022 for memory.  In May 2022 she had a right superior cerebellar, right PICA, left thalamic infarcts. Stopped Keppra after daughters claimed she didn't have active rx on file. Last seizure was in 2017, with seizure, starts with headache, then stares off, then stares off. Tapered off Topamax.  B12 was slightly low at 227.  EEG was normal.  MRI of the brain showed expected evolutionary changes, no new findings.  Is on aspirin and Plavix. MMSE 25/30 today. Since the stroke, right sided weakness, using cane or walker, feels speech speed increases when can't get the words out. Living alone, isn't driving. Memory is so-so. Has given up reading due to memory. No significant headaches. Forgets where she puts things. Has been living alone since end of last year, daughters feel she has done  well. Does  her own ADLs, keeps the house up. Is on B 12 supplement.   PAIN:  Are you having pain? No  PRECAUTIONS: Fall  WEIGHT BEARING RESTRICTIONS: No  OBJECTIVE:   TODAY'S TREATMENT:                                                                                                                              LUE in sitting prior to session: Today's Vitals   09/14/22 0856  BP: 118/71  Pulse: 81   Gait: 10MWT: x 4 : speed 23.6-26.7" (0.42 m/s to 0.37 m/s) with LBQC (SBA) with cues for [facility] sequencing 10MWT for speed trials: x 4: 9.8-15.6" without AD (1.02 m/s to 0.64 m/s) (SBA-CGA)  Speed laps without AD for 115': (CGA-SBA) Trial 1: 38" (0.92 m/s) Trial 2: 36" (0.97 m/s) Trial 3: 32" (1.09 m/s) Trial 4: 33" (1.06 m/s)  NMR:  FGA components 3 x Each component listed below for 12' (CGA w/o AD): - EO fwd/backward gait with horizontal head turns - EO fwd/backward gait with vertical head turns - EC fwd/backward gait  - Change in gait speed fast --> slow fwd gait   Theract: Up and go (mini TUGs) with emphasis on safety allowing legs to acclamate before walking over speed (practice x 3)   PATIENT EDUCATION: Education details: Continue HEP; safety with functional TUGs outside of therapy Person educated: Patient Education method: Explanation Education comprehension: verbalized understanding and needs further education  HOME EXERCISE PROGRAM: Access Code: [email or web address] URL: [email or web address] Date: 08/20/2022 Prepared by: Elease Etienne  Exercises - Walking with Eyes Closed and Counter Support  - 1 x daily - 5 x weekly - 3 sets - 10 reps - Walking with Head Nod  - 1 x daily - 5 x weekly - 3 sets - 10 reps - Standing Single Leg Stance with Counter Support  - 1 x daily - 5 x weekly - 1 sets - 3-4 reps - 20-30 seconds hold - Staggered Sit-to-Stand  - 1 x daily - 5 x weekly - 2 sets - 10 reps  You Can Walk For A Certain Length Of Time Each Day                           Walk 5 minutes 2-3 times per day.             Increase 2-3  minutes every 7 days              Work up to 20 minutes (1-2 times per day).               Example:                         Day 1-2           4-5 minutes     3 times per day  Day 7-8           10-12 minutes 2-3 times per day                         Day 13-14       20-22 minutes 1-2 times per day   GOALS: Goals reviewed with patient? Yes  SHORT TERM GOALS: Target date: 09/03/2022  Patient will demonstrate 100% compliance with initial HEP to continue to progress between physical therapy sessions.   Baseline: Provided; increasing walking and completing HEP 90% Goal status: IN PROGRESS   2.  Patient will improve ABC Score to 67% or greater to indicate a decreased risk for falls and improved self-reported confidence in balance and sense of steadiness.   Baseline: Initial 62.5%; 65% on 09/07/2022 Goal status: IN PROGRESS  3.  Patient will improve FGA score to 18/30 to indicate progress towards a decreased risk of falls and improved dynamic stability.   Baseline: 15/30; 15/30 on 09/07/2022 Goal status: IN PROGRESS   LONG TERM GOALS: Target date: 09/24/2022  Patient will report demonstrate independence with final HEP in order to maintain current gains and continue to progress after physical therapy discharge.   Baseline: To be provided  Goal status: INITIAL  2.  Patient will improve gait speed to 1.1 m/s with LRAD or greater to indicate a reduced risk for falls.   Baseline: 0.9 m/s Goal status: INITIAL  3.  Patient will improve ABC Score to 70% or greater to indicate a decreased risk for falls and improved self-reported confidence in balance and sense of steadiness.   Baseline: 62.5%; 65% on 09/07/2022 Goal status: REVISED  5.  Patient will improve FGA to greater than 20/30 to indicate progress towards decreased risk of falls and improved dynamic stability.   Baseline: 15/30; 15/30 on  09/07/2022 Goal status: REVISED  ASSESSMENT:  CLINICAL IMPRESSION: Session emphasized continuing to progress gait speed, dynamic balance, and functional safety within the home. Patient ambulated into clinic notably slower than speeds noted on initial eval. Consistent with patient reports of increased LE stiffness with weather changes. Patient improved gait speed overground without AD when she did not have to coordinate sequencing. Will require continue education on sequencing with AD to improve efficiency with device. Patient also demonstrated ability to maintain an average 1.09 m/s over 35 meters in today's session, progressing gait speed from initial eval speeds. Patient presents with decreased safety awareness in today's session attempting to quickly stand and ambulate before legs were fully upright and ready for movement. Patient was able to adjust but would place her at an increased risk for falls. Practiced x 3 reps of coming to full upright stand and allowing legs to acclimate before beginning ambulation. Recommended continuing this practice at home. Will continue to address these deficits and safety awareness in coming visits.  OBJECTIVE IMPAIRMENTS: Abnormal gait, decreased balance, decreased cognition, decreased coordination, decreased knowledge of use of DME, difficulty walking, decreased strength, and decreased safety awareness.   ACTIVITY LIMITATIONS: carrying, bending, standing, and stairs  PARTICIPATION LIMITATIONS: driving and shopping  PERSONAL FACTORS: Age, Past/current experiences, and 1 comorbidity: see PMH for details  are also affecting patient's functional outcome.   REHAB POTENTIAL: Good  CLINICAL DECISION MAKING: Evolving/moderate complexity  EVALUATION COMPLEXITY: Moderate  PLAN:  PT FREQUENCY: 1x/week  PT DURATION: 6 weeks  PLANNED INTERVENTIONS: Therapeutic exercises, Therapeutic activity, Neuromuscular re-education, Balance training, Gait training,  Patient/Family education, Self Care, Joint mobilization, Stair training, and  Manual therapy  PLAN FOR NEXT SESSION: Monitor L BP.  Modify HEP prn, stair training w/ vs w/o cane, dynamic gait tasks, static balance in tandem, dual task?, obstacle management w/ cane (over and around); recert for 4 more weeks (adjust goals pending progress in session)  Malachi Carl, PT, DPT 09/14/2022, 12:21 PM

## 2022-09-22 ENCOUNTER — Encounter: Payer: Self-pay | Admitting: Physical Therapy

## 2022-09-22 ENCOUNTER — Encounter: Payer: Medicare HMO | Admitting: Speech Pathology

## 2022-09-22 ENCOUNTER — Ambulatory Visit: Payer: Medicare HMO | Admitting: Physical Therapy

## 2022-09-22 VITALS — BP 131/81 | HR 70

## 2022-09-22 DIAGNOSIS — R2681 Unsteadiness on feet: Secondary | ICD-10-CM

## 2022-09-22 DIAGNOSIS — R278 Other lack of coordination: Secondary | ICD-10-CM

## 2022-09-22 DIAGNOSIS — R471 Dysarthria and anarthria: Secondary | ICD-10-CM | POA: Diagnosis not present

## 2022-09-22 DIAGNOSIS — R269 Unspecified abnormalities of gait and mobility: Secondary | ICD-10-CM

## 2022-09-22 DIAGNOSIS — M6281 Muscle weakness (generalized): Secondary | ICD-10-CM

## 2022-09-22 NOTE — Therapy (Unsigned)
OUTPATIENT PHYSICAL THERAPY NEURO TREATMENT/DISCHARGE SUMMARY   Patient Name: Samantha Huffman MRN: [id number] DOB:01-Mar-1956, 67 y.o., female Today's Date: 09/22/2022   PCP: Collene Leyden, MD REFERRING PROVIDER: Collene Leyden, MD  PHYSICAL THERAPY DISCHARGE SUMMARY  Visits from Start of Care: 6  Current functional level related to goals / functional outcomes: See clinical impression statement.   Remaining deficits: Dynamic imbalance requiring NBQC for stability   Education / Equipment: Discharge plan, hold vs discharge vs re-cert, progress towards goals, and process for obtaining referral to return to clinic in future.   Patient agrees to discharge. Patient goals were partially met. Patient is being discharged due to the patient's request.   END OF SESSION:  PT End of Session - 09/22/22 0853     Visit Number 6    Number of Visits 7    Date for PT Re-Evaluation 09/24/22    Authorization Type [employer]    Progress Note Due on Visit 10    PT Start Time 0845    PT Stop Time 0924    PT Time Calculation (min) 39 min    Equipment Utilized During Treatment Gait belt    Activity Tolerance Patient tolerated treatment well    Behavior During Therapy [facility] for tasks assessed/performed             Past Medical History:  Diagnosis Date   Carotid artery stenosis    1-39% bilateral stenosis by dopplers 08/2022   Coronary artery disease    a. s/p CABG 10/2012.  cath 05/2016 with moderate LM stenosis, mild LCx and RCA stenosis and occluded LAD with patent LIMA to LAD, free radial to OM.  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   Dyslipidemia    Family history of adverse reaction to anesthesia    mother and sister ponv   Generalized convulsive epilepsy without mention of intractable epilepsy    GERD (gastroesophageal reflux disease)    History of kidney stones    Hypertension    Memory deficit    slight   Migraine    "controlled on daily RX " (06/11/2016)   Nephrolithiasis  07/30/2015   S/p lithotripsy x 3 and right and left ureter stents   OSA (obstructive sleep apnea)    cpap    PONV (postoperative nausea and vomiting)    Seizures ([place])    "controlled w/daily RX; started in 2012; dr thinks they might be from the migraines; last sz was early part of 2016" (06/11/2016)   Type II diabetes mellitus ([place])    metformin   Vasovagal syncope 07/30/2015   2011   Past Surgical History:  Procedure Laterality Date   CARDIAC CATHETERIZATION  2014; 06/11/2016   CARDIAC CATHETERIZATION N/A 06/11/2016   Procedure: Left Heart Cath and Cors/Grafts Angiography;  Surgeon: Sherren Mocha, MD;  Location: [place];  Service: Cardiovascular;  Laterality: N/A;   CESAREAN SECTION     CORONARY ANGIOPLASTY     CORONARY ARTERY BYPASS GRAFT  March, 2014   LIMA to LAD, left radial to LCx x 2   CYSTOSCOPY W/ URETERAL STENT PLACEMENT Left 01/16/2019   Procedure: CYSTOSCOPY WITH RETROGRADE PYELOGRAM/URETERAL LEFT STENT PLACEMENT;  Surgeon: Ardis Hughs, MD;  Location: [facility];  Service: Urology;  Laterality: Left;   CYSTOSCOPY/URETEROSCOPY/HOLMIUM LASER/STENT PLACEMENT Left 01/24/2019   Procedure: CYSTOSCOPY, LEFT URETEROSCOPY, HOLMIUM LASER, STONE REMOVAL AND STENT EXCHANGE;  Surgeon: Ardis Hughs, MD;  Location: [facility];  Service: Urology;  Laterality: Left;   LITHOTRIPSY  X 3   RIGHT/LEFT HEART CATH AND CORONARY/GRAFT ANGIOGRAPHY N/A 09/08/2018   Procedure: RIGHT/LEFT HEART CATH AND CORONARY/GRAFT ANGIOGRAPHY;  Surgeon: Martinique, Peter M, MD;  Location: [facility];  Service: Cardiovascular;  Laterality: N/A;   TOTAL ABDOMINAL HYSTERECTOMY     ovaries took before hysterectomy   TUBAL LIGATION     URETERAL STENT PLACEMENT     Patient Active Problem List   Diagnosis Date Noted   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[place]) 03/09/2021   Cerebellar stroke ([place]) 01/30/2021   Acute cerebrovascular accident (CVA) ([place]) 01/20/2021   Acute CVA (cerebrovascular accident) ([place])  01/20/2021   GERD (gastroesophageal reflux disease)    Coronary artery disease    Depression 01/06/2020   Pneumonia 01/05/2020   History of chest pain 09/26/2018   Dyspnea on exertion 09/08/2018   Chest pain, rule out acute myocardial infarction 10/30/2017   Edema 01/25/2017   Tobacco abuse 06/10/2016   Headache 09/15/2015   OSA (obstructive sleep apnea) 07/30/2015   Nephrolithiasis 07/30/2015   Vasovagal syncope 07/30/2015   Carotid artery stenosis, asymptomatic 07/30/2015   CAD (coronary artery disease) 08/22/2013   Essential hypertension 08/22/2013   Diabetes mellitus without complication ([place])    Dyslipidemia    Seizures ([place])    Generalized convulsive epilepsy ([place]) 03/22/2013   Memory deficit 03/22/2013    ONSET DATE: 07/28/2022 (referral date)  REFERRING DIAG: Z86.73 (ICD-10-CM) - History of stroke  THERAPY DIAG:  Abnormality of gait and mobility  Unsteadiness on feet  Other lack of coordination  Muscle weakness (generalized)  Rationale for Evaluation and Treatment: Rehabilitation  SUBJECTIVE:                                                                                                                                                                                             SUBJECTIVE STATEMENT: Patient reports she is doing well today other than her leg feels heavy due to the rainy weather.  She further states she will be going to [place] soon to visit her mom and is excited about her trip.  Pt accompanied by: self  PERTINENT HISTORY:   Per neurology note: 10/06/21 SS: Rivky is here today for follow-up, seen by Dr. Leonie Man in November 2022 for memory.  In May 2022 she had a right superior cerebellar, right PICA, left thalamic infarcts. Stopped Keppra after daughters claimed she didn't have active rx on file. Last seizure was in 2017, with seizure, starts with headache, then stares off, then stares off. Tapered off Topamax.  B12 was slightly low at 227.   EEG was normal.  MRI of the brain showed expected  evolutionary changes, no new findings.  Is on aspirin and Plavix. MMSE 25/30 today. Since the stroke, right sided weakness, using cane or walker, feels speech speed increases when can't get the words out. Living alone, isn't driving. Memory is so-so. Has given up reading due to memory. No significant headaches. Forgets where she puts things. Has been living alone since end of last year, daughters feel she has done well. Does her own ADLs, keeps the house up. Is on B 12 supplement.   PAIN:  Are you having pain? No  PRECAUTIONS: Fall  WEIGHT BEARING RESTRICTIONS: No  OBJECTIVE:   TODAY'S TREATMENT:                                                                                                                              LUE in sitting prior to session: Today's Vitals   09/22/22 0850  BP: 131/81  Pulse: 70  -Discussed plan for re-cert from PT prior, pt stating she would like to discharge due to her trip.  Discussed holding vs discharging, pt further states she is unsure when she will return and what her priorities will be when she returns as her mother is 64 years old so feels it best to discharge for now.  PT explains process of obtaining new referral to return for "tune up" as needed.  Pt verbalizes understanding. -Verbally reviewed HEP, pt is able to explain full routine and walking program she has been following without prompting. -10MWT w/ NBQC:  9.78 sec = 1.02 m/sec OR 3.37 ft/sec -ABC scale (Pt requests PT to read questions to her and write responses):  64.375% -FGA:  [facility] PT Assessment - 09/22/22 0913       Functional Gait  Assessment   Gait assessed  Yes    Gait Level Surface Walks 20 ft in less than 7 sec but greater than 5.5 sec, uses assistive device, slower speed, mild gait deviations, or deviates 6-10 in outside of the 12 in walkway width.    Change in Gait Speed Able to smoothly change walking speed without loss of balance or  gait deviation. Deviate no more than 6 in outside of the 12 in walkway width.    Gait with Horizontal Head Turns Performs head turns smoothly with slight change in gait velocity (eg, minor disruption to smooth gait path), deviates 6-10 in outside 12 in walkway width, or uses an assistive device.    Gait with Vertical Head Turns Performs task with slight change in gait velocity (eg, minor disruption to smooth gait path), deviates 6 - 10 in outside 12 in walkway width or uses assistive device    Gait and Pivot Turn Pivot turns safely in greater than 3 sec and stops with no loss of balance, or pivot turns safely within 3 sec and stops with mild imbalance, requires small steps to catch balance.    Step Over Obstacle Is able to step over one shoe box (4.5 in  total height) without changing gait speed. No evidence of imbalance.    Gait with Narrow Base of Support Ambulates 4-7 steps.   4 steps prior to LOB   Gait with Eyes Closed Walks 20 ft, slow speed, abnormal gait pattern, evidence for imbalance, deviates 10-15 in outside 12 in walkway width. Requires more than 9 sec to ambulate 20 ft.    Ambulating Backwards Walks 20 ft, slow speed, abnormal gait pattern, evidence for imbalance, deviates 10-15 in outside 12 in walkway width.    Steps Alternating feet, must use rail.    Total Score 18            PATIENT EDUCATION: Education details: Discharge plan, continue w/ HEP, and process of obtaining referral to return, and progress towards goals. Person educated: Patient Education method: Explanation Education comprehension: verbalized understanding and needs further education  HOME EXERCISE PROGRAM: Access Code: [email or web address] URL: [email or web address] Date: 08/20/2022 Prepared by: Elease Etienne  Exercises - Walking with Eyes Closed and Counter Support  - 1 x daily - 5 x weekly - 3 sets - 10 reps - Walking with Head Nod  - 1 x daily - 5 x weekly - 3 sets - 10 reps - Standing Single  Leg Stance with Counter Support  - 1 x daily - 5 x weekly - 1 sets - 3-4 reps - 20-30 seconds hold - Staggered Sit-to-Stand  - 1 x daily - 5 x weekly - 2 sets - 10 reps  You Can Walk For A Certain Length Of Time Each Day                          Walk 5 minutes 2-3 times per day.             Increase 2-3  minutes every 7 days              Work up to 20 minutes (1-2 times per day).               Example:                         Day 1-2           4-5 minutes     3 times per day                         Day 7-8           10-12 minutes 2-3 times per day                         Day 13-14       20-22 minutes 1-2 times per day   GOALS: Goals reviewed with patient? Yes  SHORT TERM GOALS: Target date: 09/03/2022  Patient will demonstrate 100% compliance with initial HEP to continue to progress between physical therapy sessions.   Baseline: Provided; increasing walking and completing HEP 90% Goal status: IN PROGRESS   2.  Patient will improve ABC Score to 67% or greater to indicate a decreased risk for falls and improved self-reported confidence in balance and sense of steadiness.   Baseline: Initial 62.5%; 65% on 09/07/2022 Goal status: IN PROGRESS  3.  Patient will improve FGA score to 18/30 to indicate progress towards a decreased risk of falls and improved dynamic stability.   Baseline: 15/30; 15/30 on 09/07/2022 Goal status: IN  PROGRESS   LONG TERM GOALS: Target date: 09/24/2022  Patient will report demonstrate independence with final HEP in order to maintain current gains and continue to progress after physical therapy discharge.   Baseline: Established, pt compliant (1/31) Goal status: MET  2.  Patient will improve gait speed to 1.1 m/s with LRAD or greater to indicate a reduced risk for falls.   Baseline: 0.9 m/s; 1.02 m/sec (1/31) Goal status: MET  3.  Patient will improve ABC Score to 70% or greater to indicate a decreased risk for falls and improved self-reported confidence in  balance and sense of steadiness.   Baseline: 62.5%; 65% on 09/07/2022; 64.375% (1/31) Goal status: NOT MET  5.  Patient will improve FGA to greater than 20/30 to indicate progress towards decreased risk of falls and improved dynamic stability.   Baseline: 15/30; 15/30 on 09/07/2022; 18/30 (1/31) Goal status: NOT MET  ASSESSMENT:  CLINICAL IMPRESSION: Assessed LTGs this session with pt indicating she wishes to discharge due to upcoming trip and potential schedule conflicts upon her return.  She met 2 of 4 goals this session, but displayed progress towards 3 of 4 goals with one remaining essentially unchanged.  She has an established and updated HEP that she is compliant to.  She ambulates at a speed of 1.02 m/sec this visit with NBQC which is to goal level and progresses her towards a decreased fall risk for community level ambulation.  Her ABC scale score remains in the mid 60s at 64.375% which indicates moderate confidence in her dynamic and static stability.  Her FGA score improved from 15/30 at most recent assessment to 18/30 this visit which gets her closer to a goal of decreased fall risk just below threshold.  She would likely benefit from return to PT in the future should her schedule allow, but at this time PT to discharge.  OBJECTIVE IMPAIRMENTS: Abnormal gait, decreased balance, decreased cognition, decreased coordination, decreased knowledge of use of DME, difficulty walking, decreased strength, and decreased safety awareness.   ACTIVITY LIMITATIONS: carrying, bending, standing, and stairs  PARTICIPATION LIMITATIONS: driving and shopping  PERSONAL FACTORS: Age, Past/current experiences, and 1 comorbidity: see PMH for details  are also affecting patient's functional outcome.   REHAB POTENTIAL: Good  CLINICAL DECISION MAKING: Evolving/moderate complexity  EVALUATION COMPLEXITY: Moderate  PLAN:  PT FREQUENCY: 1x/week  PT DURATION: 6 weeks  PLANNED INTERVENTIONS: Therapeutic  exercises, Therapeutic activity, Neuromuscular re-education, Balance training, Gait training, Patient/Family education, Self Care, Joint mobilization, Stair training, and Manual therapy  PLAN FOR NEXT SESSION: N/A  Elease Etienne, PT, DPT 09/22/2022, 9:27 AM

## 2022-09-23 ENCOUNTER — Telehealth: Payer: Self-pay

## 2022-09-23 DIAGNOSIS — I6523 Occlusion and stenosis of bilateral carotid arteries: Secondary | ICD-10-CM

## 2022-09-23 NOTE — Telephone Encounter (Signed)
Order placed for repeat carotid ultrasound in 2 years.

## 2022-09-23 NOTE — Telephone Encounter (Signed)
-----  Message from Nuala Alpha, LPN sent at 7/42/5956  3:23 PM EST ----- Regarding: repeat carotids in 2 years per Dr. Radford Pax Dr. Radford Pax wants this pt to have repeat carotids done in 2 years.   Order is in the system.  Pts daughter [name] is aware you will call to arrange this appt.   Can you please schedule this appt and shoot Dr. Landis Gandy new nurse Eather Colas RN the date for her follow-up?  Thanks for all you do, Karlene Einstein

## 2022-10-20 ENCOUNTER — Ambulatory Visit: Payer: Self-pay | Admitting: Cardiology

## 2022-10-28 ENCOUNTER — Other Ambulatory Visit: Payer: Self-pay | Admitting: Cardiology

## 2022-10-28 DIAGNOSIS — I10 Essential (primary) hypertension: Secondary | ICD-10-CM

## 2022-11-08 ENCOUNTER — Other Ambulatory Visit: Payer: Self-pay | Admitting: Cardiology

## 2022-11-08 DIAGNOSIS — I2510 Atherosclerotic heart disease of native coronary artery without angina pectoris: Secondary | ICD-10-CM

## 2022-11-08 DIAGNOSIS — E785 Hyperlipidemia, unspecified: Secondary | ICD-10-CM

## 2022-11-11 ENCOUNTER — Other Ambulatory Visit: Payer: Self-pay | Admitting: Cardiology

## 2022-11-11 ENCOUNTER — Other Ambulatory Visit: Payer: Self-pay | Admitting: Physical Medicine and Rehabilitation

## 2022-11-12 ENCOUNTER — Telehealth: Payer: Self-pay | Admitting: Cardiology

## 2022-11-12 NOTE — Telephone Encounter (Signed)
Returned call to pt's daughter, I have re-enrolled her in the [employer] for another year. I have provided her with updated grant information.  CARD # [id number]   BIN [id number]   PCN [id number]   GROUP [id number]

## 2022-11-12 NOTE — Telephone Encounter (Signed)
Pt c/o medication issue:  1. Name of Medication:   Alirocumab ([place]) 75 MG/ML SOAJ    2. How are you currently taking this medication (dosage and times per day)?   INJECT CONTENTS OF 1 PEN INTO THE SKIN EVERY 14 DAYS    3. Are you having a reaction (difficulty breathing--STAT)? No  4. What is your medication issue? Pt's daughter would ike a callback regarding mediation cost. She states that pt has not been having to pay for medication but after speaking with the pharmacy they are stating that medication will be $60.00. Pt only has 1 injection left before she is out and would ike a callback regarding this matter . Please advise

## 2023-01-04 ENCOUNTER — Ambulatory Visit: Payer: Medicare HMO | Admitting: Neurology

## 2023-01-04 ENCOUNTER — Encounter: Payer: Self-pay | Admitting: Neurology

## 2023-01-04 VITALS — BP 122/58 | HR 72 | Ht 61.0 in | Wt 132.2 lb

## 2023-01-04 DIAGNOSIS — R269 Unspecified abnormalities of gait and mobility: Secondary | ICD-10-CM

## 2023-01-04 DIAGNOSIS — I69398 Other sequelae of cerebral infarction: Secondary | ICD-10-CM | POA: Diagnosis not present

## 2023-01-04 DIAGNOSIS — Z8669 Personal history of other diseases of the nervous system and sense organs: Secondary | ICD-10-CM

## 2023-01-04 DIAGNOSIS — I639 Cerebral infarction, unspecified: Secondary | ICD-10-CM | POA: Diagnosis not present

## 2023-01-04 DIAGNOSIS — G3184 Mild cognitive impairment, so stated: Secondary | ICD-10-CM

## 2023-01-04 NOTE — Patient Instructions (Signed)
I had a long discussion with the patient and her daughter regarding her remote stroke and poststroke gait abnormality, remote history of seizures, poststroke mild cognitive impairment all of which appear to be stable.  Remain Plavix for stroke prevention given history of significant peripheral arterial and coronary artery disease.  Maintain aggressive risk factor modification with strict control of hypertension with blood pressure goal below 30/90, lipids with LDL cholesterol goal below 70 mg percent and diabetes with hemoglobin A1c goal below 6.5%.  I encouraged her to increase participation in cognitively challenging activities like solving crossword puzzles, playing bridge and sudoku for her mild cognitive impairment.  We also discussed memory disease.  Continue Keppra for seizure prophylaxis in the current dose of 500 mg twice daily.  She was encouraged to use a cane at all times in order to avoid unnecessary falls and injury..  She will return for follow-up in a year with sarah Slack,NP or call earlier if necessary.     Memory Compensation Strategies  Use "WARM" strategy.  W= write it down  A= associate it  R= repeat it  M= make a mental note  2.   You can keep a Glass blower/designer.  Use a 3-ring notebook with sections for the following: calendar, important names and phone numbers,  medications, doctors' names/phone numbers, lists/reminders, and a section to journal what you did  each day.   3.    Use a calendar to write appointments down.  4.    Write yourself a schedule for the day.  This can be placed on the calendar or in a separate section of the Memory Notebook.  Keeping a  regular schedule can help memory.  5.    Use medication organizer with sections for each day or morning/evening pills.  You may need help loading it  6.    Keep a basket, or pegboard by the door.  Place items that you need to take out with you in the basket or on the pegboard.  You may also want to  include a message  board for reminders.  7.    Use sticky notes.  Place sticky notes with reminders in a place where the task is performed.  For example: " turn off the  stove" placed by the stove, "lock the door" placed on the door at eye level, " take your medications" on  the bathroom mirror or by the place where you normally take your medications.  8.    Use alarms/timers.  Use while cooking to remind yourself to check on food or as a reminder to take your medicine, or as a  reminder to make a call, or as a reminder to perform another task, etc.

## 2023-01-04 NOTE — Progress Notes (Signed)
PATIENT: Samantha Huffman DOB: 1956-01-17  REASON FOR VISIT: Follow up for memory, stroke, seizures HISTORY FROM: Patient PRIMARY NEUROLOGIST: Dr. Pearlean Brownie   HISTORY OF PRESENT ILLNESS:  Samantha Huffman is here today for follow-up.  Denies seizures.  Remains on Keppra.  On aspirin and Plavix for stroke prevention.  Lipid panel 01/25/22 total cholesterol 86, LDL low 18.  Last visit B12 was 814, remains on supplement. MOCA 22/30 today. Lives alone, her daughter comes over to help with medications/bathing. Does drive if someone is in the car with her. BP 178/87, didn't take medications before appointment, on Lisinopril, Imdur, Coreg. Claims still having high readings at home in the 170's. No new stroke like symptoms. Speech is slightly slurred, when it rains right side feels weak. No falls, using quad cane. Does her own cooking. Denies headaches, last seizure around 2017.  Update 12/21/2021 Dr. Pearlean Brownie : She returns for follow-up after last visit with me 6 months ago.  She is accompanied by her daughter.  Patient continues to have mild short-term memory difficulties but these appear to be unchanged and are not progressing. She is still living at home by herself.  She gets help from her daughter who arranges her medications as well as helps her with about otherwise she does most activities for herself.  She in fact wants to drive and she has not driven for years since her stroke.  She is back on Keppra 500 mg twice daily which is tolerating well without any side effects.  She has had no seizures.  She has had no recurrent stroke or TIA symptoms either.  She remains on aspirin Plavix which is tolerating well without bruising or bleeding.  She is tolerating Lipitor well without muscle aches and pains.  She states her sugars are under good control.  She had lipid panel 09/03/2021 which showed LDL cholesterol of 8 mg percent and total cholesterol of 66.  Zetia was discontinued after this primary physician.  10/06/21 SS:  Samantha Huffman is here today for follow-up, seen by Dr. Pearlean Brownie in November 2022 for memory.  In May 2022 she had a right superior cerebellar, right PICA, left thalamic infarcts. Stopped Keppra after daughters claimed she didn't have active rx on file. Last seizure was in 2017, with seizure, starts with headache, then stares off, then stares off. Tapered off Topamax.  B12 was slightly low at 227.  EEG was normal.  MRI of the brain showed expected evolutionary changes, no new findings.  Is on aspirin and Plavix. MMSE 25/30 today. Since the stroke, right sided weakness, using cane or walker, feels speech speed increases when can't get the words out. Living alone, isn't driving. Memory is so-so. Has given up reading due to memory. No significant headaches. Forgets where she puts things. Has been living alone since end of last year, daughters feel she has done well. Does her own ADLs, keeps the house up. Is on B 12 supplement.   HISTORY  HPI: Prior visit 04/06/2022  Samantha Ege, NP) she returns for follow-up after last visit with Dr. Anne Hahn on 03/09/2021.  She is accompanied by her grandson.  Patient's main complaint today is memory difficulties which she has had for 10 years she feels that it is not progressive but family feels that it is bothersome.  She has trouble remembering recent information and making new memories.  She struggles with new information.  She lives alone and still cooks for herself and washes and does her dishes and uses the bathroom.  She has stopped driving.  She has not had any evaluation for reversible causes of memory loss or tried any memory medications.  There is no family history of Alzheimer's or dementia.  Patient has no recent symptoms of stroke or TIA.  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  She is currently on Topamax which she had been taking for migraine but has not had a migraine for several years now.  She is willing to reduce and stop it.   She feels she still has some residual speech difficulties from a stroke in May she has sometimes trouble finding words and speaking.  She is still dragging the right side as well and balance is also slightly off and not back to baseline.  She remains on Keppra for seizures and has not had any breakthrough seizures.  She remains on aspirin and Plavix or stroke prevention as well as on Lipitor and Praluent injections for lipids.  Blood pressure well controlled today it is 135/80 Update 01/04/2023      : She returns for follow-up after last visit 9 months ago.  She is accompanied by her daughter.  She states she is doing well.  She has had no recurrent stroke or TIA symptoms.  He remains on aspirin and Plavix which is tolerating well with minor bruising and no bleeding.  Remains on Lipitor and Praluent injections last lipid profile checked on 01/25/2022 had shown LDL cholesterol to be 18 mg percent.  She states her blood pressure is under good control.  She states her sugars have also been fine.  She had carotid ultrasound on 08/30/2022 which showed only minor 1-39% bilateral ICA stenosis.  Continues to have mild short-term memory difficulties be unchanged.  He still living by herself dependent in activities of daily living.  Her daughter helps with finances and for her appointments.  She does do some word searches and puzzles but does not socialize a lot  or go outdoors.    Not had any seizures since 2019 and remains on Keppra 500 mg twice daily which is tolerating well without side effects.  She has no new complaints      :  REVIEW OF SYSTEMS: Out of a complete 14 system review of symptoms, the patient complains only of the following symptoms, and all other reviewed systems are negative.  See HPI  ALLERGIES: Allergies  Allergen Reactions   Depakote [Divalproex Sodium] Other (See Comments)    Hyperammonemia   Other Other (See Comments)   Penicillins Hives, Other (See Comments) and Rash    Other reaction(s): GI  intolerance Has patient had a PCN reaction causing immediate rash, facial/tongue/throat swelling, SOB or lightheadedness with hypotension: YES Has patient had a PCN reaction causing severe rash involving mucus membranes or skin necrosis: NO Has patient had a PCN reaction that required hospitalizationNO Has patient had a PCN reaction occurring within the last 10 years: NO If all of the above answers are "NO", then may proceed with Cephalosporin use. Other reaction(s): GI Upset (intolerance) Other reaction(s): GI intolerance Has patient had a PCN reaction causing immediate rash, facial/tongue/throat swelling, SOB or lightheadedness with hypotension: YES Has patient had a PCN reaction causing severe rash involving mucus membranes or skin necrosis: NO Has patient had a PCN reaction that required hospitalizationNO Has patient had a PCN reaction occurring within the last 10 years: NO If all of the above answers are "NO", then may proceed with Cephalosporin use.  HOME MEDICATIONS: Outpatient Medications Prior to Visit  Medication Sig Dispense Refill   acetaminophen (TYLENOL) 325 MG tablet Take 1-2 tablets (325-650 mg total) by mouth every 4 (four) hours as needed for mild pain.     Alirocumab (PRALUENT) 75 MG/ML SOAJ INJECT CONTENTS OF 1 PEN INTO THE SKIN EVERY 14 DAYS 6 mL 0   aspirin EC 81 MG tablet Take 1 tablet (81 mg total) by mouth daily. Swallow whole. 90 tablet 3   atorvastatin (LIPITOR) 80 MG tablet TAKE 1 TABLET BY MOUTH EVERY DAY 90 tablet 1   carvedilol (COREG) 3.125 MG tablet Take 1 tablet (3.125 mg total) by mouth daily. 90 tablet 3   clopidogrel (PLAVIX) 75 MG tablet Take 1 tablet (75 mg total) by mouth daily. 90 tablet 3   escitalopram (LEXAPRO) 10 MG tablet Take 1 tablet (10 mg total) by mouth at bedtime. 30 tablet 0   isosorbide mononitrate (IMDUR) 30 MG 24 hr tablet TAKE 1/2 OF A TABLET (15 MG TOTAL) BY MOUTH DAILY 45 tablet 0   JARDIANCE 10 MG TABS tablet Take 10 mg by mouth  daily.     levETIRAcetam (KEPPRA) 500 MG tablet Take 1 tablet (500 mg total) by mouth 2 (two) times daily. 180 tablet 3   lisinopril (ZESTRIL) 20 MG tablet TAKE 1 TABLET BY MOUTH TWICE A DAY 180 tablet 1   metFORMIN (GLUCOPHAGE-XR) 500 MG 24 hr tablet Take 1 tablet (500 mg total) by mouth daily with breakfast. 30 tablet 0   nitroGLYCERIN (NITROSTAT) 0.4 MG SL tablet Place 1 tablet (0.4 mg total) under the tongue every 5 (five) minutes x 3 doses as needed for chest pain. 25 tablet 4   vitamin B-12 (CYANOCOBALAMIN) 1000 MCG tablet Take 1,000 mcg by mouth daily.     No facility-administered medications prior to visit.    PAST MEDICAL HISTORY: Past Medical History:  Diagnosis Date   Carotid artery stenosis    1-39% bilateral stenosis by dopplers 08/2022   Coronary artery disease    a. s/p CABG 10/2012.  cath 05/2016 with moderate LM stenosis, mild LCx and RCA stenosis and occluded LAD with patent LIMA to LAD, free radial to OM.  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   Dyslipidemia    Family history of adverse reaction to anesthesia    mother and sister ponv   Generalized convulsive epilepsy without mention of intractable epilepsy    GERD (gastroesophageal reflux disease)    History of kidney stones    Hypertension    Memory deficit    slight   Migraine    "controlled on daily RX " (06/11/2016)   Nephrolithiasis 07/30/2015   S/p lithotripsy x 3 and right and left ureter stents   OSA (obstructive sleep apnea)    cpap    PONV (postoperative nausea and vomiting)    Seizures (HCC)    "controlled w/daily RX; started in 2012; dr thinks they might be from the migraines; last sz was early part of 2016" (06/11/2016)   Type II diabetes mellitus (HCC)    metformin   Vasovagal syncope 07/30/2015   2011    PAST SURGICAL HISTORY: Past Surgical History:  Procedure Laterality Date   CARDIAC CATHETERIZATION  2014; 06/11/2016   CARDIAC CATHETERIZATION N/A 06/11/2016   Procedure: Left Heart  Cath and Cors/Grafts Angiography;  Surgeon: Tonny Bollman, MD;  Location: [facility] INVASIVE CV LAB;  Service: Cardiovascular;  Laterality: N/A;   CESAREAN SECTION     CORONARY ANGIOPLASTY  CORONARY ARTERY BYPASS GRAFT  March, 2014   LIMA to LAD, left radial to LCx x 2   CYSTOSCOPY W/ URETERAL STENT PLACEMENT Left 01/16/2019   Procedure: CYSTOSCOPY WITH RETROGRADE PYELOGRAM/URETERAL LEFT STENT PLACEMENT;  Surgeon: Crist Fat, MD;  Location: [facility];  Service: Urology;  Laterality: Left;   CYSTOSCOPY/URETEROSCOPY/HOLMIUM LASER/STENT PLACEMENT Left 01/24/2019   Procedure: CYSTOSCOPY, LEFT URETEROSCOPY, HOLMIUM LASER, STONE REMOVAL AND STENT EXCHANGE;  Surgeon: Crist Fat, MD;  Location: [facility];  Service: Urology;  Laterality: Left;   LITHOTRIPSY  X 3   RIGHT/LEFT HEART CATH AND CORONARY/GRAFT ANGIOGRAPHY N/A 09/08/2018   Procedure: RIGHT/LEFT HEART CATH AND CORONARY/GRAFT ANGIOGRAPHY;  Surgeon: Swazil[place], Peter M, MD;  Location: [facility] INVASIVE CV LAB;  Service: Cardiovascular;  Laterality: N/A;   TOTAL ABDOMINAL HYSTERECTOMY     ovaries took before hysterectomy   TUBAL LIGATION     URETERAL STENT PLACEMENT      FAMILY HISTORY: Family History  Problem Relation Age of Onset   Cancer Father    Diabetes Father    Diabetes Brother    Stroke Brother    Diabetes Brother    Heart disease Brother        CAD with CABG   Heart disease Sister        CAD with MI   Heart attack Sister     SOCIAL HISTORY: Social History   Socioeconomic History   Marital status: Divorced    Spouse name: Not on file   Number of children: 2   Years of education: 12   Highest education level: Not on file  Occupational History   Occupation: retired  Tobacco Use   Smoking status: Former    Packs/day: 0.33    Years: 35.00    Additional pack years: 0.00    Total pack years: 11.55    Types: Cigarettes    Quit date: 08/2020    Years since quitting: 2.3   Smokeless tobacco: Former    Types: Snuff, Chew     Quit date: 11/20/2012  Vaping Use   Vaping Use: Never used  Substance and Sexual Activity   Alcohol use: Yes    Comment: 06/11/2016 "might have a few drinks/month; usually wine"   Drug use: No   Sexual activity: Not Currently  Other Topics Concern   Not on file  Social History Narrative   Patient drinks about 1 cup of coffee daily.   Patient is right handed.    Social Determinants of Health   Financial Resource Strain: Not on file  Food Insecurity: Not on file  Transportation Needs: Not on file  Physical Activity: Not on file  Stress: Not on file  Social Connections: Not on file  Intimate Partner Violence: Not on file   PHYSICAL EXAM  Vitals:   01/04/23 1512  BP: (!) 122/58  Pulse: 72  Weight: 132 lb 3.2 oz (60 kg)  Height: 5\' 1"  (1.549 m)    Body mass index is 24.98 kg/m.  Generalized: Well developed pleasant middle-aged African-American lady, in no acute distress     12/21/2021    4:13 PM 10/06/2021    9:25 AM 07/22/2021    1:15 PM  MMSE - Mini Mental State Exam  Orientation to time 3 5 4   Orientation to Place 5 5 5   Registration 3 3 3   Attention/ Calculation 1 0 0  Recall 3 3 3   Language- name 2 objects 2 2 2   Language- repeat 1 1 1   Language-  follow 3 step command 3 3 3   Language- read & follow direction 1 1 1   Write a sentence 1 1 1   Copy design 1 1 0  Total score 24 25 23    Neurological examination  Mentation: Alert oriented to time, place, history taking.  Diminished recall 2/3.  Able to name only 11 animals which can walk on 4 legs.  Clock drawing 3/4.  ( Last visit MOCA 22/30.) Follows all commands, speech is slightly slurred, has fragmented quality, with emphasis on certain aspects. Very pleasant.  Cranial nerve II-XII: Pupils were equal round reactive to light. Extraocular movements were full, visual field were full on confrontational test saccadic dysmetria on horizontal gaze right greater than left.. Facial sensation and strength were normal.  Head turning and shoulder shrug  were normal and symmetric. Motor: 4/5 right arm and leg, slightly increased tone on the right leg.  Prominent action tremor in the right upper extremity is absent at rest. Sensory: Sensory testing is intact to soft touch on all 4 extremities. No evidence of extinction is noted.  Coordination: Moderate tremor with finger-nose-finger on the right Gait and station: Gait slightly wide-based, limp on the right, can walk independently short distances, uses quad cane I Reflexes: Deep tendon reflexes are symmetric but exaggerated on the right arm and leg  DIAGNOSTIC DATA (LABS, IMAGING, TESTING) - I reviewed patient records, labs, notes, testing and imaging myself where available.  Lab Results  Component Value Date   WBC 5.8 02/09/2021   HGB 12.7 02/09/2021   HCT 37.9 02/09/2021   MCV 95.2 02/09/2021   PLT 182 02/09/2021      Component Value Date/Time   NA 137 10/07/2021 1028   K 4.3 10/07/2021 1028   CL 102 10/07/2021 1028   CO2 22 10/07/2021 1028   GLUCOSE 158 (H) 10/07/2021 1028   GLUCOSE 138 (H) 02/09/2021 0554   BUN 12 10/07/2021 1028   CREATININE 0.79 10/07/2021 1028   CREATININE 0.87 08/18/2020 0955   CALCIUM 10.2 10/07/2021 1028   PROT 6.7 01/31/2021 0501   PROT 7.4 09/15/2015 1026   ALBUMIN 3.7 01/31/2021 0501   ALBUMIN 4.4 09/15/2015 1026   AST 30 01/31/2021 0501   ALT 40 (H) 12/02/2021 0830   ALKPHOS 100 01/31/2021 0501   BILITOT 0.6 01/31/2021 0501   BILITOT 0.3 09/15/2015 1026   GFRNONAA 54 (L) 02/09/2021 0554   GFRAA >60 01/09/2020 0405   Lab Results  Component Value Date   CHOL 86 (L) 01/25/2022   HDL 43 01/25/2022   LDLCALC 18 01/25/2022   LDLDIRECT 67 06/13/2018   TRIG 145 01/25/2022   CHOLHDL 2.0 01/25/2022   Lab Results  Component Value Date   HGBA1C 7.1 (H) 01/21/2021   Lab Results  Component Value Date   VITAMINB12 814 10/06/2021   Lab Results  Component Value Date   TSH 1.310 07/27/2021   ASSESSMENT AND  PLAN 67 y.o. year old female   1.  Right cerebellar stroke, right PICA stroke, left thalamic stroke 2.  History of seizures 3.  Hypertension 4.  Diabetes 5.  Hyperlipidemia 6.  Post-stroke memory loss, cognitive impairment  -I had a long discussion with the patient and her daughter regarding her remote stroke and poststroke gait abnormality, remote history of seizures, poststroke mild cognitive impairment all of which appear to be stable.  She is well remain on aspirin and Plavix for stroke prevention given history of significant peripheral arterial and coronary artery disease.  Maintain aggressive risk  factor modification with strict control of hypertension with blood pressure goal below 30/90, lipids with LDL cholesterol goal below 70 mg percent and diabetes with hemoglobin A1c goal below 6.5%.  I encouraged her to increase participation in cognitively challenging activities like solving crossword puzzles, playing bridge and sudoku for her mild cognitive impairment.  We also discussed memory disease.  Continue Keppra for seizure prophylaxis in the current dose of 500 mg twice daily.  She was encouraged to use a cane at all times in order to avoid unnecessary falls and injury..  She will return for follow-up in a year with sarah Slack,NP or call earlier if necessary.  Greater than 50% time during this 40-minute visit was spent on counseling and coordination of care about her stroke, gait ataxia, mild cognitive impairment and seizures questions. Delia Heady, MD 01/04/2023, 3:24 PM [facility] [address], [address] [place], [place] [address] [phone]

## 2023-01-20 ENCOUNTER — Other Ambulatory Visit: Payer: Self-pay | Admitting: Cardiology

## 2023-01-20 DIAGNOSIS — E785 Hyperlipidemia, unspecified: Secondary | ICD-10-CM

## 2023-01-20 DIAGNOSIS — I2510 Atherosclerotic heart disease of native coronary artery without angina pectoris: Secondary | ICD-10-CM

## 2023-01-21 ENCOUNTER — Other Ambulatory Visit (HOSPITAL_COMMUNITY): Payer: Self-pay

## 2023-01-31 ENCOUNTER — Other Ambulatory Visit (HOSPITAL_COMMUNITY): Payer: Self-pay

## 2023-01-31 NOTE — Telephone Encounter (Signed)
PRALUENT IS NON PREFERRED ON THIS PLAN PER TEST CLAIM   REPATHA IS ON FORMULARY, Can we change her to Repatha?

## 2023-02-01 ENCOUNTER — Telehealth: Payer: Self-pay

## 2023-02-01 ENCOUNTER — Other Ambulatory Visit: Payer: Self-pay | Admitting: Family Medicine

## 2023-02-01 ENCOUNTER — Other Ambulatory Visit (HOSPITAL_COMMUNITY): Payer: Self-pay

## 2023-02-01 DIAGNOSIS — Z1231 Encounter for screening mammogram for malignant neoplasm of breast: Secondary | ICD-10-CM

## 2023-02-01 MED ORDER — REPATHA SURECLICK 140 MG/ML ~~LOC~~ SOAJ: 140.0000 mg | SUBCUTANEOUS | 3 refills | Status: DC

## 2023-02-01 NOTE — Telephone Encounter (Signed)
Pharmacy Patient Advocate Encounter   Received notification from [facility] MEDICARE that prior authorization for REPATHA is needed.    PA submitted on 02/01/23 Key [id number] Status is pending  Haze Rushing, CPhT Pharmacy Patient Advocate Specialist Direct Number: [phone] Fax: [phone]

## 2023-02-01 NOTE — Telephone Encounter (Signed)
PA initiated for Repatha, please see separate encounter for updates on determination. (I will route you back in once a decision has been made)  Haze Rushing, CPhT Pharmacy Patient Advocate Specialist Direct Number: [phone] Fax: [phone]

## 2023-02-01 NOTE — Telephone Encounter (Addendum)
Called pt to advise her that Repatha PA was approved and that her insurance no longer covers Praluent. New rx has been sent in, I attached her [employer] grant info to the rx as well. Spoke with pt's daughter who is aware.  

## 2023-02-01 NOTE — Telephone Encounter (Signed)
Called pt to advise her that Repatha PA was approved and that her insurance no longer covers Praluent. New rx has been sent in, I attached her [employer] grant info to the rx as well. Spoke with pt's daughter who is aware.

## 2023-02-01 NOTE — Telephone Encounter (Signed)
Pharmacy Patient Advocate Encounter  Prior Authorization for REPATHA has been approved.    Effective dates: 08/23/22 through 02/01/24  Haze Rushing, CPhT Pharmacy Patient Advocate Specialist Direct Number: [phone] Fax: [phone]

## 2023-02-11 ENCOUNTER — Ambulatory Visit: Payer: Medicare HMO | Attending: Cardiology | Admitting: Cardiology

## 2023-02-11 ENCOUNTER — Ambulatory Visit (INDEPENDENT_AMBULATORY_CARE_PROVIDER_SITE_OTHER): Payer: Medicare HMO

## 2023-02-11 ENCOUNTER — Encounter: Payer: Self-pay | Admitting: Cardiology

## 2023-02-11 VITALS — BP 90/40 | HR 74 | Ht 61.0 in | Wt 130.6 lb

## 2023-02-11 DIAGNOSIS — I2510 Atherosclerotic heart disease of native coronary artery without angina pectoris: Secondary | ICD-10-CM | POA: Diagnosis not present

## 2023-02-11 DIAGNOSIS — R55 Syncope and collapse: Secondary | ICD-10-CM

## 2023-02-11 DIAGNOSIS — I10 Essential (primary) hypertension: Secondary | ICD-10-CM

## 2023-02-11 DIAGNOSIS — E785 Hyperlipidemia, unspecified: Secondary | ICD-10-CM | POA: Diagnosis not present

## 2023-02-11 DIAGNOSIS — G4733 Obstructive sleep apnea (adult) (pediatric): Secondary | ICD-10-CM

## 2023-02-11 DIAGNOSIS — R0602 Shortness of breath: Secondary | ICD-10-CM

## 2023-02-11 DIAGNOSIS — I6523 Occlusion and stenosis of bilateral carotid arteries: Secondary | ICD-10-CM | POA: Diagnosis not present

## 2023-02-11 MED ORDER — CARVEDILOL 3.125 MG PO TABS: 3.1250 mg | ORAL_TABLET | Freq: Two times a day (BID) | ORAL | 3 refills | Status: DC

## 2023-02-11 NOTE — Patient Instructions (Signed)
Medication Instructions:  DECREASE your carvedilol dose to 3.125 mg twice a day.   STOP taking your Jardiance.   *If you need a refill on your cardiac medications before your next appointment, please call your pharmacy*   Lab Work: None.  If you have labs (blood work) drawn today and your tests are completely normal, you will receive your results only by: MyChart Message (if you have MyChart) OR A paper copy in the mail If you have any lab test that is abnormal or we need to change your treatment, we will call you to review the results.   Testing/Procedures: Your physician has requested that you have an echocardiogram. Echocardiography is a painless test that uses sound waves to create images of your heart. It provides your doctor with information about the size and shape of your heart and how well your heart's chambers and valves are working. This procedure takes approximately one hour. There are no restrictions for this procedure. Please do NOT wear cologne, perfume, aftershave, or lotions (deodorant is allowed). Please arrive 15 minutes prior to your appointment time.  ZIO XT- Long Term Monitor Instructions  Your physician has requested you wear a ZIO patch monitor for 14 days.  This is a single patch monitor. [employer] supplies one patch monitor per enrollment. Additional stickers are not available. Please do not apply patch if you will be having a Nuclear Stress Test,  Echocardiogram, Cardiac CT, MRI, or Chest Xray during the period you would be wearing the  monitor. The patch cannot be worn during these tests. You cannot remove and re-apply the  ZIO XT patch monitor.  Your ZIO patch monitor will be mailed 3 day USPS to your address on file. It may take 3-5 days  to receive your monitor after you have been enrolled.  Once you have received your monitor, please review the enclosed instructions. Your monitor  has already been registered assigning a specific monitor serial # to  you.  Billing and Patient Assistance Program Information  We have supplied [employer] with any of your insurance information on file for billing purposes. [employer] offers a sliding scale Patient Assistance Program for patients that do not have  insurance, or whose insurance does not completely cover the cost of the ZIO monitor.  You must apply for the Patient Assistance Program to qualify for this discounted rate.  To apply, please call [employer] at [phone], select option 4, select option 2, ask to apply for  Patient Assistance Program. Meredeth Ide will ask your household income, and how many people  are in your household. They will quote your out-of-pocket cost based on that information.  [employer] will also be able to set up a 81-month, interest-free payment plan if needed.  Applying the monitor   Shave hair from upper left chest.  Hold abrader disc by orange tab. Rub abrader in 40 strokes over the upper left chest as  indicated in your monitor instructions.  Clean area with 4 enclosed alcohol pads. Let dry.  Apply patch as indicated in monitor instructions. Patch will be placed under collarbone on left  side of chest with arrow pointing upward.  Rub patch adhesive wings for 2 minutes. Remove white label marked "1". Remove the white  label marked "2". Rub patch adhesive wings for 2 additional minutes.  While looking in a mirror, press and release button in center of patch. A small green light will  flash 3-4 times. This will be your only indicator that the monitor has been turned  on.  Do not shower for the first 24 hours. You may shower after the first 24 hours.  Press the button if you feel a symptom. You will hear a small click. Record Date, Time and  Symptom in the Patient Logbook.  When you are ready to remove the patch, follow instructions on the last 2 pages of Patient  Logbook. Stick patch monitor onto the last page of Patient Logbook.  Place Patient Logbook in the blue and white box.  Use locking tab on box and tape box closed  securely. The blue and white box has prepaid postage on it. Please place it in the mailbox as  soon as possible. Your physician should have your test results approximately 7 days after the  monitor has been mailed back to [facility].  Call [facility] Customer Care at [phone] if you have questions regarding  your ZIO XT patch monitor. Call them immediately if you see an orange light blinking on your  monitor.  If your monitor falls off in less than 4 days, contact our Monitor department at [phone].  If your monitor becomes loose or falls off after 4 days call [employer] at [phone] for  suggestions on securing your monitor    Follow-Up:  We recommend signing up for the patient portal called "MyChart".  Sign up information is provided on this After Visit Summary.  MyChart is used to connect with patients for Virtual Visits (Telemedicine).  Patients are able to view lab/test results, encounter notes, upcoming appointments, etc.  Non-urgent messages can be sent to your provider as well.   To learn more about what you can do with MyChart, go to [email or web address].    Your next appointment:   8 week(s)  Provider:   Jari Favre, PA-C, Robin Searing, NP, Eligha Bridegroom, NP, or Tereso Newcomer, PA-C     Then, Armanda Magic, MD will plan to see you again in 6 month(s).

## 2023-02-11 NOTE — Addendum Note (Signed)
Addended by: Luellen Pucker on: 02/11/2023 02:18 PM   Modules accepted: Orders

## 2023-02-11 NOTE — Progress Notes (Signed)
Date:  02/11/2023   ID:  Samantha Huffman, DOB 28-Oct-1955, MRN [id number]  PCP:  Irven Coe, MD  Cardiologist:  Armanda Magic, MD  Electrophysiologist:  None   Chief Complaint:  CAD, Carotid stenosis, HLD, HTN  History of Present Illness:    Samantha Huffman is a 67 y.o. female with a history of CAD status post CABG 10/2012 in [place], abnormal stress test 2017 but cath showed patent grafts to the OM and LAD and 30% RCA normal LV function. Patient has had periodic chest pain and was placed on Imdur for possible microvascular angina. PCP was treating her for anxiety. 2D echo 07/2018 EF 60 to 65% with grade 1 DD.   Patient underwent cardiac catheterization 09/04/2018 due to ongoing CP and found to have patent LIMA to the LAD, patent SVGs and radial graft to the OM, normal LV function, low LV filling pressures. Chlorthalidone and Lasix were held because of low filling pressures and blood pressure otherwise medical therapy continued. She also has HLD, HTN, DM2, OSA on CPAP and carotid artery dz as well as tobacco abuse.  She is here today for followup and is doing well.  She denies any chest pain or pressure, SOB, DOE, PND, orthopnea, LE edema, dizziness, palpitations.  She tells me that a few days ago she was sitting in a chair outside and was fine and then suddenly awakened laying on the ground and bruised her knee, hip and elbow.  She had felt fine prior to the event that was unwitnessed.  She has no idea how long she was out.  She was oriented to to person and place but could not figure out why she was on the ground.  She is compliant with her meds and is tolerating meds with no SE.    Prior CV studies:   The following studies were reviewed today:  none  Past Medical History:  Diagnosis Date   Carotid artery stenosis    1-39% bilateral stenosis by dopplers 08/2022   Coronary artery disease    a. s/p CABG 10/2012.  cath 05/2016 with moderate LM stenosis, mild LCx and RCA stenosis and  occluded LAD with patent LIMA to LAD, free radial to OM.  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   Dyslipidemia    Family history of adverse reaction to anesthesia    mother and sister ponv   Generalized convulsive epilepsy without mention of intractable epilepsy    GERD (gastroesophageal reflux disease)    History of kidney stones    Hypertension    Memory deficit    slight   Migraine    "controlled on daily RX " (06/11/2016)   Nephrolithiasis 07/30/2015   S/p lithotripsy x 3 and right and left ureter stents   OSA (obstructive sleep apnea)    cpap    PONV (postoperative nausea and vomiting)    Seizures (HCC)    "controlled w/daily RX; started in 2012; dr thinks they might be from the migraines; last sz was early part of 2016" (06/11/2016)   Type II diabetes mellitus (HCC)    metformin   Vasovagal syncope 07/30/2015   2011   Past Surgical History:  Procedure Laterality Date   CARDIAC CATHETERIZATION  2014; 06/11/2016   CARDIAC CATHETERIZATION N/A 06/11/2016   Procedure: Left Heart Cath and Cors/Grafts Angiography;  Surgeon: Tonny Bollman, MD;  Location: [facility] INVASIVE CV LAB;  Service: Cardiovascular;  Laterality: N/A;   CESAREAN SECTION     CORONARY ANGIOPLASTY  CORONARY ARTERY BYPASS GRAFT  March, 2014   LIMA to LAD, left radial to LCx x 2   CYSTOSCOPY W/ URETERAL STENT PLACEMENT Left 01/16/2019   Procedure: CYSTOSCOPY WITH RETROGRADE PYELOGRAM/URETERAL LEFT STENT PLACEMENT;  Surgeon: Crist Fat, MD;  Location: [facility] ORS;  Service: Urology;  Laterality: Left;   CYSTOSCOPY/URETEROSCOPY/HOLMIUM LASER/STENT PLACEMENT Left 01/24/2019   Procedure: CYSTOSCOPY, LEFT URETEROSCOPY, HOLMIUM LASER, STONE REMOVAL AND STENT EXCHANGE;  Surgeon: Crist Fat, MD;  Location: [facility] ORS;  Service: Urology;  Laterality: Left;   LITHOTRIPSY  X 3   RIGHT/LEFT HEART CATH AND CORONARY/GRAFT ANGIOGRAPHY N/A 09/08/2018   Procedure: RIGHT/LEFT HEART CATH AND CORONARY/GRAFT ANGIOGRAPHY;   Surgeon: [place]az[place], Peter M, MD;  Location: [facility] INVASIVE CV LAB;  Service: Cardiovascular;  Laterality: N/A;   TOTAL ABDOMINAL HYSTERECTOMY     ovaries took before hysterectomy   TUBAL LIGATION     URETERAL STENT PLACEMENT       Current Meds  Medication Sig   acetaminophen (TYLENOL) 325 MG tablet Take 1-2 tablets (325-650 mg total) by mouth every 4 (four) hours as needed for mild pain.   aspirin EC 81 MG tablet Take 1 tablet (81 mg total) by mouth daily. Swallow whole.   atorvastatin (LIPITOR) 80 MG tablet TAKE 1 TABLET BY MOUTH EVERY DAY   carvedilol (COREG) 12.5 MG tablet Take 12.5 mg by mouth 2 (two) times daily.   clopidogrel (PLAVIX) 75 MG tablet Take 1 tablet (75 mg total) by mouth daily.   escitalopram (LEXAPRO) 10 MG tablet Take 1 tablet (10 mg total) by mouth at bedtime.   Evolocumab (REPATHA SURECLICK) 140 MG/ML SOAJ Inject 140 mg into the skin every 14 (fourteen) days.   isosorbide mononitrate (IMDUR) 30 MG 24 hr tablet TAKE 1/2 OF A TABLET (15 MG TOTAL) BY MOUTH DAILY   JARDIANCE 10 MG TABS tablet Take 10 mg by mouth daily.   levETIRAcetam (KEPPRA) 500 MG tablet Take 1 tablet (500 mg total) by mouth 2 (two) times daily.   lisinopril (ZESTRIL) 20 MG tablet TAKE 1 TABLET BY MOUTH TWICE A DAY   metFORMIN (GLUCOPHAGE) 500 MG tablet Take 500 mg by mouth 2 (two) times daily with a meal.   nitroGLYCERIN (NITROSTAT) 0.4 MG SL tablet Place 1 tablet (0.4 mg total) under the tongue every 5 (five) minutes x 3 doses as needed for chest pain.   vitamin B-12 (CYANOCOBALAMIN) 1000 MCG tablet Take 1,000 mcg by mouth daily.     Allergies:   Depakote [divalproex sodium], Other, and Penicillins   Social History   Tobacco Use   Smoking status: Former    Packs/day: 0.33    Years: 35.00    Additional pack years: 0.00    Total pack years: 11.55    Types: Cigarettes    Quit date: 08/2020    Years since quitting: 2.4   Smokeless tobacco: Former    Types: Snuff, Dorna Bloom    Quit date: 11/20/2012   Vaping Use   Vaping Use: Never used  Substance Use Topics   Alcohol use: Yes    Comment: 06/11/2016 "might have a few drinks/month; usually wine"   Drug use: No     Family Hx: The patient's family history includes Cancer in her father; Diabetes in her brother, brother, and father; Heart attack in her sister; Heart disease in her brother and sister; Stroke in her brother.  ROS:   Please see the history of present illness.     All other systems reviewed and  are negative.   Labs/Other Tests and Data Reviewed:    EKG Interpretation  Date/Time:  Friday February 11 2023 13:39:47 EDT Ventricular Rate:  74 PR Interval:  134 QRS Duration: 90 QT Interval:  382 QTC Calculation: 424 R Axis:   86 Text Interpretation: Normal sinus rhythm T wave abnormality, consider anterolateral ischemia and inferior ischemia When compared with ECG of 20-Jan-2021 12:39, no significant change Confirmed by Armanda Magic ([id number]) on 02/11/2023 1:58:31 PM    Recent Labs: No results found for requested labs within last 365 days.   Recent Lipid Panel Lab Results  Component Value Date/Time   CHOL 86 (L) 01/25/2022 11:31 AM   TRIG 145 01/25/2022 11:31 AM   HDL 43 01/25/2022 11:31 AM   CHOLHDL 2.0 01/25/2022 11:31 AM   CHOLHDL 2.9 01/21/2021 05:08 AM   LDLCALC 18 01/25/2022 11:31 AM   LDLCALC 44 08/18/2020 09:45 AM   LDLDIRECT 67 06/13/2018 03:17 PM    Wt Readings from Last 3 Encounters:  02/11/23 130 lb 9.6 oz (59.2 kg)  01/04/23 132 lb 3.2 oz (60 kg)  04/06/22 154 lb 3.2 oz (69.9 kg)     Objective:    Vital Signs:  BP (!) 90/40   Pulse 74   Ht 5\' 1"  (1.549 m)   Wt 130 lb 9.6 oz (59.2 kg)   SpO2 93%   BMI 24.68 kg/m   GEN: Well nourished, well developed in no acute distress HEENT: Normal NECK: No JVD; No carotid bruits LYMPHATICS: No lymphadenopathy CARDIAC:RRR, no murmurs, rubs, gallops RESPIRATORY:  Clear to auscultation without rales, wheezing or rhonchi  ABDOMEN: Soft, non-tender,  non-distended MUSCULOSKELETAL:  No edema; No deformity  SKIN: Warm and dry NEUROLOGIC:  Alert and oriented x 3 PSYCHIATRIC:  Normal affect  ASSESSMENT & PLAN:    1. ASCAD  -CABG x2 in 2014 with LIMA to the LAD and free radial to the OM with recent cath 09/04/2018 with patent LIMA to the LAD and patent free radial graft to the OM.  -She denies any anginal chest pain or shortness of breath since her last office visit -Continue prescription drug managed with aspirin 81 mg daily,  Plavix 75 mg daily, atorvastatin 80 mg daily, Repatha, Imdur 15 mg daily with as needed refills -decrease Carvedilol to 3.125mg  BID (had been titrated up to 12.5mg  BID after CVA)  2. HTN  -BP is soft on exam -Continue drug management with Imdur 15 mg daily and Zestril 20mg  BID with as needed refills -decrease Carvedilol to 3.125mg  BID (had been 12.5mg  BID) -I have personally reviewed and interpreted outside labs performed by patient's PCP which showed serum creatinine 0.77 and potassium 4.1 07/20/2022   3. Hyperlipidemia  -LDL goal is < 70.  -I have personally reviewed and interpreted outside labs performed by patient's PCP which showed LDL 34 and HDL 42 on 07/20/2022 -Continue prescription drug management with atorvastatin 80 mg daily, Repatha with as needed refills   4. Carotid artery stenosis  -dopplers 08/2020 and 09/11/2022 showed 1-39% bilateral stenosis  -continue ASA and statin.    5.  OSA  -minimal with AHI 5.7/hr -intolerant to CPAP   7.  SOB -this has resolved -cath 08/2018 showed patent grafts and SOB has not changed since then -LVF is normal  8.  Syncope -suspect orthostatic as she is hypotensive on exam today -no incontinence or post ictal state after waking up so unlikely a seizure -she was started on Jardiance a few months ago and her Carvedilol also  was increased from 3.125mg  BID to 12.5mg  BID after her CVA -will decrease Carvedilol back to 3.125mg  BID and stop Jardiance for now -check 2D  echo -check 2 week ziopatch to rule out arrhythmia -she has followup with her PCP in 1 week   Medication Adjustments/Labs and Tests Ordered: Current medicines are reviewed at length with the patient today.  Concerns regarding medicines are outlined above.  Tests Ordered: Orders Placed This Encounter  Procedures   EKG 12-Lead   Medication Changes: No orders of the defined types were placed in this encounter.   Disposition:  Follow up in 1 year(s)  Signed, Armanda Magic, MD  02/11/2023 2:00 PM    [facility]

## 2023-02-11 NOTE — Progress Notes (Unsigned)
Enrolled for [employer] to mail a ZIO XT long term holter monitor to the patients address on file.  

## 2023-02-28 DIAGNOSIS — R55 Syncope and collapse: Secondary | ICD-10-CM | POA: Diagnosis not present

## 2023-03-01 ENCOUNTER — Ambulatory Visit: Payer: Medicare HMO

## 2023-03-03 ENCOUNTER — Other Ambulatory Visit: Payer: Self-pay | Admitting: Acute Care

## 2023-03-03 DIAGNOSIS — Z87891 Personal history of nicotine dependence: Secondary | ICD-10-CM

## 2023-03-03 DIAGNOSIS — Z122 Encounter for screening for malignant neoplasm of respiratory organs: Secondary | ICD-10-CM

## 2023-03-04 ENCOUNTER — Ambulatory Visit (HOSPITAL_BASED_OUTPATIENT_CLINIC_OR_DEPARTMENT_OTHER): Payer: Medicare HMO

## 2023-03-15 ENCOUNTER — Ambulatory Visit (HOSPITAL_COMMUNITY): Payer: Medicare HMO | Attending: Cardiology

## 2023-03-15 DIAGNOSIS — I5030 Unspecified diastolic (congestive) heart failure: Secondary | ICD-10-CM | POA: Diagnosis not present

## 2023-03-15 DIAGNOSIS — R55 Syncope and collapse: Secondary | ICD-10-CM | POA: Insufficient documentation

## 2023-03-15 DIAGNOSIS — I358 Other nonrheumatic aortic valve disorders: Secondary | ICD-10-CM | POA: Diagnosis not present

## 2023-03-15 DIAGNOSIS — I517 Cardiomegaly: Secondary | ICD-10-CM

## 2023-03-15 LAB — ECHOCARDIOGRAM COMPLETE
Area-P 1/2: 4.01 cm2
S' Lateral: 2.6 cm

## 2023-03-16 ENCOUNTER — Ambulatory Visit (HOSPITAL_COMMUNITY)
Admission: RE | Admit: 2023-03-16 | Discharge: 2023-03-16 | Disposition: A | Discharge status: Home / Self Care | Payer: Medicare HMO | Source: Ambulatory Visit | Attending: Acute Care | Admitting: Acute Care

## 2023-03-16 DIAGNOSIS — Z122 Encounter for screening for malignant neoplasm of respiratory organs: Secondary | ICD-10-CM | POA: Insufficient documentation

## 2023-03-16 DIAGNOSIS — Z87891 Personal history of nicotine dependence: Secondary | ICD-10-CM | POA: Insufficient documentation

## 2023-03-23 ENCOUNTER — Ambulatory Visit: Payer: Medicare HMO

## 2023-03-23 ENCOUNTER — Telehealth: Payer: Self-pay | Admitting: Acute Care

## 2023-03-23 NOTE — Telephone Encounter (Signed)
Call report received and noted in LCS dashboard Routed to provider for review:  1. New diffuse perilymphatic ground-glass and solid nodularity, suggestive of sarcoid. Difficult to definitively exclude underlying adenocarcinoma or lymphangitic carcinomatosis although not strongly favored. Patient may be a poor candidate for lung cancer screening CT in this setting. By size criteria, exam is categorized as Lung-RADS 4A, suspicious. Follow up low-dose chest CT without contrast in 3 months (please use the following order, "CT CHEST LCS NODULE FOLLOW-UP W/O CM") is recommended. Alternatively, PET may be considered when there is a solid component 8mm or larger. These results will be called to the ordering clinician or representative by the Radiologist Assistant, and communication documented in the PACS or [employer]. 2.  Aortic atherosclerosis (ICD10-I70.0). 3.  Emphysema (ICD10-J43.9).

## 2023-03-24 ENCOUNTER — Ambulatory Visit
Admission: RE | Admit: 2023-03-24 | Discharge: 2023-03-24 | Disposition: A | Discharge status: Home / Self Care | Payer: Medicare HMO | Source: Ambulatory Visit | Attending: Family Medicine | Admitting: Family Medicine

## 2023-03-24 ENCOUNTER — Telehealth: Payer: Self-pay | Admitting: Acute Care

## 2023-03-24 DIAGNOSIS — Z1231 Encounter for screening mammogram for malignant neoplasm of breast: Secondary | ICD-10-CM

## 2023-03-24 DIAGNOSIS — Z87891 Personal history of nicotine dependence: Secondary | ICD-10-CM

## 2023-03-24 DIAGNOSIS — R911 Solitary pulmonary nodule: Secondary | ICD-10-CM

## 2023-03-24 NOTE — Telephone Encounter (Signed)
Results / plans faxed to PCP / Order placed for 3 month nodule f/u CT with note to call pt's daughter to schedule appt.

## 2023-03-24 NOTE — Telephone Encounter (Signed)
I have called the patient with the results of her low-dose screening CT.  I explained that she has several nodules that are about 7 mm in size that we need to reevaluate at a 23-month interval rather than waiting a 9-month period of time.  I explained that repeat scan will be done in 3 months which will be at the end of October 2024. She is in agreement with this plan however she request that we call to schedule the follow-up scan with her daughter Joannie Springs. Her phone number is [phone].  I did leave a message on her phone requesting that she call us. D, D, and E please fax results to PCP and let them know plan is for 46-month follow-up, due end October 2024 or very early November.  Ladies please make sure follow-up imaging appointment is scheduled through the patient's daughter Ms. Brooks as patient has had a stroke and states that she is very forgetful regarding appointments. Thank so much

## 2023-03-24 NOTE — Telephone Encounter (Signed)
Pt's daughter, Joannie Springs [facility]) called back and I discussed pt's CT results and plan to repeat CT in 3 months. She verbalized understanding and is aware we will call her closer to 3 months to schedule the scan.

## 2023-04-06 ENCOUNTER — Other Ambulatory Visit: Payer: Self-pay | Admitting: Cardiology

## 2023-04-06 NOTE — Progress Notes (Signed)
Cardiology Office Note:  .   Date:  04/06/2023  ID:  Samantha Huffman, DOB [birth year], MRN [id number] PCP: Irven Coe, MD  [facility] Providers Cardiologist:  Armanda Magic, MD {  History of Present Illness: .   Samantha Huffman is a 67 y.o. female with a past medical history of CAD status post CABG 10/2012 in [place], abnormal stress test 2017 by cardiac cath showed patent grafts to the OM and LAD and 30% RCA.  Normal LV function.  Patient has had periodic chest pain and was placed on Imdur for possible microvascular angina.  PCP was treating her anxiety.  2D echo 12/19 showed LVEF 66 5%, grade 1 DD.  History includes cardiac catheterization 09/04/2018 due to ongoing chest pain and found to have patent LIMA to LAD, patent SVGs and radial graft to the OM, normal LV function, low LV filling pressures.  Chlorthalidone and Lasix were held because of low feeling pressures and blood pressure otherwise, medical therapy was continued.  She also has a history of HLD, HTN, DM 2, OSA on CPAP and carotid artery disease as well as tobacco abuse.  She was seen by Dr. Mayford Knife June 2024 and at that time was doing well.  Denied chest pain/pressure, SOB, DOE, PND, orthopnea, LEE edema, dizziness, or palpitations.  She stated that a few days ago she was sitting in the chair outside and was fine and suddenly woke up laying on the ground and had bruised her knee, hip, and elbow.  She felt fine prior to the event that was unwitnessed.  She had no idea how long she was unconscious.  Was oriented to person and place but cannot figure out why she was on the ground.  She is compliant with medications and tolerating medications without any side effects.  Today, she tells me she has not had any syncope since she saw Dr. Mayford Knife.  No shortness of breath or chest pain.  She was on Jardiance and metformin and thought this might be dropping her blood pressure too much.  Jardiance was discontinued and blood pressure came up.  No  swelling in her lower legs today.  When she had her stroke she was told to lose some weight and has lost around 10 pounds.  Now does not require CPAP.  Feeling good today  She tells me that she is on a burgundy and white Diskus inhaler but is unsure of the name. She has not had any SOB.   Reports no shortness of breath nor dyspnea on exertion. Reports no chest pain, pressure, or tightness. No edema, orthopnea, PND. Reports no palpitations.     ROS: Pertinent ROS in HPI  Studies Reviewed: .       Cardiac monitor 02/11/2023   Predominant rhythm was normal sinus rhythm with average heart rate 77bpm and ranged from 3 to 148bpm.   1 run of Nonsustained VT at 5 beats at 148bpm   1 episode of nonsustained atrial fibrillation lasting 6 beats at 133bpm   Rare PACs, atrial couplets   Occasional PVCs with PVC load 2%.  Rare triplet couplets and triplets as well as ventricular bigeminy and trigeminy Echocardiogram from 03/15/2023 IMPRESSIONS     1. Left ventricular ejection fraction, by estimation, is 55 to 60%. The  left ventricle has normal function. The left ventricle has no regional  wall motion abnormalities. Left ventricular diastolic parameters are  consistent with Grade I diastolic  dysfunction (impaired relaxation).   2. Right ventricular systolic function is  normal. The right ventricular  size is normal. There is normal pulmonary artery systolic pressure.   3. Left atrial size was mildly dilated.   4. The mitral valve is normal in structure. Trivial mitral valve  regurgitation. No evidence of mitral stenosis.   5. The aortic valve is tricuspid. There is mild calcification of the  aortic valve. Aortic valve regurgitation is not visualized. Aortic valve  sclerosis/calcification is present, without any evidence of aortic  stenosis.   6. The inferior vena cava is normal in size with greater than 50%  respiratory variability, suggesting right atrial pressure of 3 mmHg.   FINDINGS    Left Ventricle: Left ventricular ejection fraction, by estimation, is 55  to 60%. The left ventricle has normal function. The left ventricle has no  regional wall motion abnormalities. The left ventricular internal cavity  size was normal in size. There is   no left ventricular hypertrophy. Left ventricular diastolic parameters  are consistent with Grade I diastolic dysfunction (impaired relaxation).   Right Ventricle: The right ventricular size is normal. No increase in  right ventricular wall thickness. Right ventricular systolic function is  normal. There is normal pulmonary artery systolic pressure. The tricuspid  regurgitant velocity is 2.65 m/s, and   with an assumed right atrial pressure of 3 mmHg, the estimated right  ventricular systolic pressure is 31.1 mmHg.   Left Atrium: Left atrial size was mildly dilated.   Right Atrium: Right atrial size was normal in size.   Pericardium: There is no evidence of pericardial effusion.   Mitral Valve: The mitral valve is normal in structure. Trivial mitral  valve regurgitation. No evidence of mitral valve stenosis.   Tricuspid Valve: The tricuspid valve is normal in structure. Tricuspid  valve regurgitation is trivial. No evidence of tricuspid stenosis.   Aortic Valve: The aortic valve is tricuspid. There is mild calcification  of the aortic valve. Aortic valve regurgitation is not visualized. Aortic  valve sclerosis/calcification is present, without any evidence of aortic  stenosis.   Pulmonic Valve: The pulmonic valve was normal in structure. Pulmonic valve  regurgitation is trivial. No evidence of pulmonic stenosis.   Aorta: The aortic root is normal in size and structure.   Venous: The inferior vena cava is normal in size with greater than 50%  respiratory variability, suggesting right atrial pressure of 3 mmHg.   IAS/Shunts: No atrial level shunt detected by color flow Doppler.        Physical Exam:   VS:  There were no  vitals taken for this visit.   Wt Readings from Last 3 Encounters:  02/11/23 130 lb 9.6 oz (59.2 kg)  01/04/23 132 lb 3.2 oz (60 kg)  04/06/22 154 lb 3.2 oz (69.9 kg)    GEN: Well nourished, well developed in no acute distress NECK: No JVD; No carotid bruits CARDIAC: RRR, no murmurs, rubs, gallops RESPIRATORY:  Clear to auscultation without rales, wheezing or rhonchi  ABDOMEN: Soft, non-tender, non-distended EXTREMITIES:  No edema; No deformity   ASSESSMENT AND PLAN: .   1.  CAD status post CABG x 2 in 2014 -No chest pain or shortness of breath today -Continue current medications including aspirin 81mg  daily, Lipitor 80mg  daily, carvedilol 3.125 mg twice a day Plavix 75 mg daily, Repatha 140 mg/mL every 14 days, Imdur 15 mg daily, lisinopril 20 mg twice a day  2.  HTN -Was hypotensive last appointment but 20 40/70 -Would continue current medication regimen for now and keep track  of blood pressure at home -If blood pressure continues to be elevated would consider increasing her carvedilol -Continue current exercise regimen as well as low-sodium, heart healthy diet  3. HLD -Recent LDL 15, HDL 40, total cholesterol 72 triglycerides 74 (02/15/2023) -would follow-up with labs in a year -continue current medications  4. Carotid artery stenosis -Jan 2026 will be due for a repeat US -recent US reviewed with the patient today with mild stenosis  5. OSA on CPAP -not on CPAP since she lost weight   6.  SOB -resolved   7.  Syncope -Has not happened again -Would continue current medications and if happens again let us know -No further workup indicated at this time -Reviewed recent echocardiogram, ultrasound of carotids, and monitor with the patient and her daughter today     Dispo: She will follow-up in 6 months with Dr. Mayford Knife or APP  Signed, Sharlene Dory, PA-C

## 2023-04-07 ENCOUNTER — Ambulatory Visit: Payer: Medicare HMO | Admitting: Neurology

## 2023-04-08 ENCOUNTER — Ambulatory Visit: Payer: Medicare HMO | Admitting: Physician Assistant

## 2023-04-08 ENCOUNTER — Encounter: Payer: Self-pay | Admitting: Physician Assistant

## 2023-04-08 VITALS — BP 140/70 | HR 71 | Ht 61.0 in | Wt 131.0 lb

## 2023-04-08 DIAGNOSIS — R0602 Shortness of breath: Secondary | ICD-10-CM

## 2023-04-08 DIAGNOSIS — I10 Essential (primary) hypertension: Secondary | ICD-10-CM

## 2023-04-08 DIAGNOSIS — R55 Syncope and collapse: Secondary | ICD-10-CM

## 2023-04-08 DIAGNOSIS — I6523 Occlusion and stenosis of bilateral carotid arteries: Secondary | ICD-10-CM

## 2023-04-08 DIAGNOSIS — I2510 Atherosclerotic heart disease of native coronary artery without angina pectoris: Secondary | ICD-10-CM | POA: Diagnosis not present

## 2023-04-08 DIAGNOSIS — E785 Hyperlipidemia, unspecified: Secondary | ICD-10-CM

## 2023-04-08 DIAGNOSIS — G4733 Obstructive sleep apnea (adult) (pediatric): Secondary | ICD-10-CM

## 2023-04-08 NOTE — Patient Instructions (Signed)
Medication Instructions:  Your physician recommends that you continue on your current medications as directed. Please refer to the Current Medication list given to you today.  *If you need a refill on your cardiac medications before your next appointment, please call your pharmacy*  Lab Work: None ordered If you have labs (blood work) drawn today and your tests are completely normal, you will receive your results only by: MyChart Message (if you have MyChart) OR A paper copy in the mail If you have any lab test that is abnormal or we need to change your treatment, we will call you to review the results.  Follow-Up: At [facility], you and your health needs are our priority.  As part of our continuing mission to provide you with exceptional heart care, we have created designated Provider Care Teams.  These Care Teams include your primary Cardiologist (physician) and Advanced Practice Providers (APPs -  Physician Assistants and Nurse Practitioners) who all work together to provide you with the care you need, when you need it.  We recommend signing up for the patient portal called "MyChart".  Sign up information is provided on this After Visit Summary.  MyChart is used to connect with patients for Virtual Visits (Telemedicine).  Patients are able to view lab/test results, encounter notes, upcoming appointments, etc.  Non-urgent messages can be sent to your provider as well.   To learn more about what you can do with MyChart, go to [email or web address].    Your next appointment:   6 month(s)  Provider:   Armanda Magic, MD     Other Instructions Check your blood pressure daily, 1 hr after morning medications for 2 weeks, keep a log and send [place] the readings through mychart or call us at the end of the 2 weeks.   Heart-Healthy Eating Plan Many factors influence your heart health, including eating and exercise habits. Heart health is also called coronary health. Coronary risk increases  with abnormal blood fat (lipid) levels. A heart-healthy eating plan includes limiting unhealthy fats, increasing healthy fats, limiting salt (sodium) intake, and making other diet and lifestyle changes. What is my plan? Your health care provider may recommend that: You limit your fat intake to _________% or less of your total calories each day. You limit your saturated fat intake to _________% or less of your total calories each day. You limit the amount of cholesterol in your diet to less than _________ mg per day. You limit the amount of sodium in your diet to less than _________ mg per day. What are tips for following this plan? Cooking Cook foods using methods other than frying. Baking, boiling, grilling, and broiling are all good options. Other ways to reduce fat include: Removing the skin from poultry. Removing all visible fats from meats. Steaming vegetables in water or broth. Meal planning  At meals, imagine dividing your plate into fourths: Fill one-half of your plate with vegetables and green salads. Fill one-fourth of your plate with whole grains. Fill one-fourth of your plate with lean protein foods. Eat 2-4 cups of vegetables per day. One cup of vegetables equals 1 cup (91 g) broccoli or cauliflower florets, 2 medium carrots, 1 large bell pepper, 1 large sweet potato, 1 large tomato, 1 medium white potato, 2 cups (150 g) raw leafy greens. Eat 1-2 cups of fruit per day. One cup of fruit equals 1 small apple, 1 large banana, 1 cup (237 g) mixed fruit, 1 large orange,  cup (82 g) dried fruit,  1 cup (240 mL) 100% fruit juice. Eat more foods that contain soluble fiber. Examples include apples, broccoli, carrots, beans, peas, and barley. Aim to get 25-30 g of fiber per day. Increase your consumption of legumes, nuts, and seeds to 4-5 servings per week. One serving of dried beans or legumes equals  cup (90 g) cooked, 1 serving of nuts is  oz (12 almonds, 24 pistachios, or 7 walnut  halves), and 1 serving of seeds equals  oz (8 g). Fats Choose healthy fats more often. Choose monounsaturated and polyunsaturated fats, such as olive and canola oils, avocado oil, flaxseeds, walnuts, almonds, and seeds. Eat more omega-3 fats. Choose salmon, mackerel, sardines, tuna, flaxseed oil, and ground flaxseeds. Aim to eat fish at least 2 times each week. Check food labels carefully to identify foods with trans fats or high amounts of saturated fat. Limit saturated fats. These are found in animal products, such as meats, butter, and cream. Plant sources of saturated fats include palm oil, palm kernel oil, and coconut oil. Avoid foods with partially hydrogenated oils in them. These contain trans fats. Examples are stick margarine, some tub margarines, cookies, crackers, and other baked goods. Avoid fried foods. General information Eat more home-cooked food and less restaurant, buffet, and fast food. Limit or avoid alcohol. Limit foods that are high in added sugar and simple starches such as foods made using white refined flour (white breads, pastries, sweets). Lose weight if you are overweight. Losing just 5-10% of your body weight can help your overall health and prevent diseases such as diabetes and heart disease. Monitor your sodium intake, especially if you have high blood pressure. Talk with your health care provider about your sodium intake. Try to incorporate more vegetarian meals weekly. What foods should I eat? Fruits All fresh, canned (in natural juice), or frozen fruits. Vegetables Fresh or frozen vegetables (raw, steamed, roasted, or grilled). Green salads. Grains Most grains. Choose whole wheat and whole grains most of the time. Rice and pasta, including brown rice and pastas made with whole wheat. Meats and other proteins Lean, well-trimmed beef, veal, pork, and lamb. Chicken and [place] without skin. All fish and shellfish. Wild duck, rabbit, pheasant, and venison. Egg  whites or low-cholesterol egg substitutes. Dried beans, peas, lentils, and tofu. Seeds and most nuts. Dairy Low-fat or nonfat cheeses, including ricotta and mozzarella. Skim or 1% milk (liquid, powdered, or evaporated). Buttermilk made with low-fat milk. Nonfat or low-fat yogurt. Fats and oils Non-hydrogenated (trans-free) margarines. Vegetable oils, including soybean, sesame, sunflower, olive, avocado, peanut, safflower, corn, canola, and cottonseed. Salad dressings or mayonnaise made with a vegetable oil. Beverages Water (mineral or sparkling). Coffee and tea. Unsweetened ice tea. Diet beverages. Sweets and desserts Sherbet, gelatin, and fruit ice. Small amounts of dark chocolate. Limit all sweets and desserts. Seasonings and condiments All seasonings and condiments. The items listed above may not be a complete list of foods and beverages you can eat. Contact a dietitian for more options. What foods should I avoid? Fruits Canned fruit in heavy syrup. Fruit in cream or butter sauce. Fried fruit. Limit coconut. Vegetables Vegetables cooked in cheese, cream, or butter sauce. Fried vegetables. Grains Breads made with saturated or trans fats, oils, or whole milk. Croissants. Sweet rolls. Donuts. High-fat crackers, such as cheese crackers and chips. Meats and other proteins Fatty meats, such as hot dogs, ribs, sausage, bacon, rib-eye roast or steak. High-fat deli meats, such as salami and bologna. Caviar. Domestic duck and goose. Organ meats, such  as liver. Dairy Cream, sour cream, cream cheese, and creamed cottage cheese. Whole-milk cheeses. Whole or 2% milk (liquid, evaporated, or condensed). Whole buttermilk. Cream sauce or high-fat cheese sauce. Whole-milk yogurt. Fats and oils Meat fat, or shortening. Cocoa butter, hydrogenated oils, palm oil, coconut oil, palm kernel oil. Solid fats and shortenings, including bacon fat, salt pork, lard, and butter. Nondairy cream substitutes. Salad  dressings with cheese or sour cream. Beverages Regular sodas and any drinks with added sugar. Sweets and desserts Frosting. Pudding. Cookies. Cakes. Pies. Milk chocolate or white chocolate. Buttered syrups. Full-fat ice cream or ice cream drinks. The items listed above may not be a complete list of foods and beverages to avoid. Contact a dietitian for more information. Summary Heart-healthy meal planning includes limiting unhealthy fats, increasing healthy fats, limiting salt (sodium) intake and making other diet and lifestyle changes. Lose weight if you are overweight. Losing just 5-10% of your body weight can help your overall health and prevent diseases such as diabetes and heart disease. Focus on eating a balance of foods, including fruits and vegetables, low-fat or nonfat dairy, lean protein, nuts and legumes, whole grains, and heart-healthy oils and fats. This information is not intended to replace advice given to you by your health care provider. Make sure you discuss any questions you have with your health care provider. Document Revised: 09/14/2021 Document Reviewed: 09/14/2021 Elsevier Patient Education  2024 [employer] Low-Sodium Eating Plan Salt (sodium) helps you keep a healthy balance of fluids in your body. Too much sodium can raise your blood pressure. It can also cause fluid and waste to be held in your body. Your health care provider or dietitian may recommend a low-sodium eating plan if you have high blood pressure (hypertension), kidney disease, liver disease, or heart failure. Eating less sodium can help lower your blood pressure and reduce swelling. It can also protect your heart, liver, and kidneys. What are tips for following this plan? Reading food labels  Check food labels for the amount of sodium per serving. If you eat more than one serving, you must multiply the listed amount by the number of servings. Choose foods with less than 140 milligrams (mg) of sodium per  serving. Avoid foods with 300 mg of sodium or more per serving. Always check how much sodium is in a product, even if the label says "unsalted" or "no salt added." Shopping  Buy products labeled as "low-sodium" or "no salt added." Buy fresh foods. Avoid canned foods and pre-made or frozen meals. Avoid canned, cured, or processed meats. Buy breads that have less than 80 mg of sodium per slice. Cooking  Eat more home-cooked food. Try to eat less restaurant, buffet, and fast food. Try not to add salt when you cook. Use salt-free seasonings or herbs instead of table salt or sea salt. Check with your provider or pharmacist before using salt substitutes. Cook with plant-based oils, such as canola, sunflower, or olive oil. Meal planning When eating at a restaurant, ask if your food can be made with less salt or no salt. Avoid dishes labeled as brined, pickled, cured, or smoked. Avoid dishes made with soy sauce, miso, or teriyaki sauce. Avoid foods that have monosodium glutamate (MSG) in them. MSG may be added to some restaurant food, sauces, soups, bouillon, and canned foods. Make meals that can be grilled, baked, poached, roasted, or steamed. These are often made with less sodium. General information Try to limit your sodium intake to 1,500-2,300 mg each day, or  the amount told by your provider. What foods should I eat? Fruits Fresh, frozen, or canned fruit. Fruit juice. Vegetables Fresh or frozen vegetables. "No salt added" canned vegetables. "No salt added" tomato sauce and paste. Low-sodium or reduced-sodium tomato and vegetable juice. Grains Low-sodium cereals, such as oats, puffed wheat and rice, and shredded wheat. Low-sodium crackers. Unsalted rice. Unsalted pasta. Low-sodium bread. Whole grain breads and whole grain pasta. Meats and other proteins Fresh or frozen meat, poultry, seafood, and fish. These should have no added salt. Low-sodium canned tuna and salmon. Unsalted nuts. Dried  peas, beans, and lentils without added salt. Unsalted canned beans. Eggs. Unsalted nut butters. Dairy Milk. Soy milk. Cheese that is naturally low in sodium, such as ricotta cheese, fresh mozzarella, or Swiss cheese. Low-sodium or reduced-sodium cheese. Cream cheese. Yogurt. Seasonings and condiments Fresh and dried herbs and spices. Salt-free seasonings. Low-sodium mustard and ketchup. Sodium-free salad dressing. Sodium-free light mayonnaise. Fresh or refrigerated horseradish. Lemon juice. Vinegar. Other foods Homemade, reduced-sodium, or low-sodium soups. Unsalted popcorn and pretzels. Low-salt or salt-free chips. The items listed above may not be all the foods and drinks you can have. Talk to a dietitian to learn more. What foods should I avoid? Vegetables Sauerkraut, pickled vegetables, and relishes. Olives. [place] fries. Onion rings. Regular canned vegetables, except low-sodium or reduced-sodium items. Regular canned tomato sauce and paste. Regular tomato and vegetable juice. Frozen vegetables in sauces. Grains Instant hot cereals. Bread stuffing, pancake, and biscuit mixes. Croutons. Seasoned rice or pasta mixes. Noodle soup cups. Boxed or frozen macaroni and cheese. Regular salted crackers. Self-rising flour. Meats and other proteins Meat or fish that is salted, canned, smoked, spiced, or pickled. Precooked or cured meat, such as sausages or meat loaves. Tomasa Blase. Ham. Pepperoni. Hot dogs. Corned beef. Chipped beef. Salt pork. Jerky. Pickled herring, anchovies, and sardines. Regular canned tuna. Salted nuts. Dairy Processed cheese and cheese spreads. Hard cheeses. Cheese curds. Blue cheese. Feta cheese. String cheese. Regular cottage cheese. Buttermilk. Canned milk. Fats and oils Salted butter. Regular margarine. Ghee. Bacon fat. Seasonings and condiments Onion salt, garlic salt, seasoned salt, table salt, and sea salt. Canned and packaged gravies. Worcestershire sauce. Tartar sauce. Barbecue  sauce. Teriyaki sauce. Soy sauce, including reduced-sodium soy sauce. Steak sauce. Fish sauce. Oyster sauce. Cocktail sauce. Horseradish that you find on the shelf. Regular ketchup and mustard. Meat flavorings and tenderizers. Bouillon cubes. Hot sauce. Pre-made or packaged marinades. Pre-made or packaged taco seasonings. Relishes. Regular salad dressings. Salsa. Other foods Salted popcorn and pretzels. Corn chips and puffs. Potato and tortilla chips. Canned or dried soups. Pizza. Frozen entrees and pot pies. The items listed above may not be all the foods and drinks you should avoid. Talk to a dietitian to learn more. This information is not intended to replace advice given to you by your health care provider. Make sure you discuss any questions you have with your health care provider. Document Revised: 08/26/2022 Document Reviewed: 08/26/2022 Elsevier Patient Education  2024 [employer].

## 2023-05-08 ENCOUNTER — Other Ambulatory Visit: Payer: Self-pay | Admitting: Cardiology

## 2023-05-08 DIAGNOSIS — I10 Essential (primary) hypertension: Secondary | ICD-10-CM

## 2023-05-17 ENCOUNTER — Other Ambulatory Visit: Payer: Self-pay | Admitting: Cardiology

## 2023-05-19 ENCOUNTER — Ambulatory Visit: Payer: Medicare HMO | Admitting: Internal Medicine

## 2023-05-20 ENCOUNTER — Ambulatory Visit (HOSPITAL_BASED_OUTPATIENT_CLINIC_OR_DEPARTMENT_OTHER): Payer: Medicare HMO | Admitting: Pulmonary Disease

## 2023-05-20 ENCOUNTER — Encounter (HOSPITAL_BASED_OUTPATIENT_CLINIC_OR_DEPARTMENT_OTHER): Payer: Self-pay | Admitting: Pulmonary Disease

## 2023-05-20 VITALS — BP 124/66 | HR 73 | Resp 16 | Ht 61.0 in | Wt 137.7 lb

## 2023-05-20 DIAGNOSIS — D869 Sarcoidosis, unspecified: Secondary | ICD-10-CM | POA: Diagnosis not present

## 2023-05-20 DIAGNOSIS — G43909 Migraine, unspecified, not intractable, without status migrainosus: Secondary | ICD-10-CM | POA: Insufficient documentation

## 2023-05-20 NOTE — Progress Notes (Unsigned)
Subjective:   PATIENT ID: Samantha Huffman GENDER: female DOB: [birth year], MRN: [id number]  Chief Complaint  Patient presents with   Consult    Abnormal CT    Reason for Visit: New consult for sarcoid  Ms. Genola Pow is a 67 year old female active smoker with allergic rhinitis, emphysema, HTN, DM2, HLD, hx CVA with residual right hemiparesis who presents for evaluation for abnormal CT scan. Daughter present and provides additional history.  She was started on Anoro one month ago and has improved her cough. Denies wheezing. Has fatigue and needs breaks when performing cooking, vacuuming, clearing at a slow pace. Needs assistance with bathing and upper body activities (taking out trash). Uses a cane to ambulate. She report smokes between 1/4-1/2 ppd. She started regularly smoking in her 58s. Denies vaping. Denies essential oil use.  Social History: Retired. Previous govt clerical work  I have personally reviewed patient's past medical/family/social history, allergies, current medications.  Past Medical History:  Diagnosis Date   Carotid artery stenosis    1-39% bilateral stenosis by dopplers 08/2022   Coronary artery disease    a. s/p CABG 10/2012.  cath 05/2016 with moderate LM stenosis, mild LCx and RCA stenosis and occluded LAD with patent LIMA to LAD, free radial to OM.  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   Dyslipidemia    Family history of adverse reaction to anesthesia    mother and sister ponv   Generalized convulsive epilepsy without mention of intractable epilepsy    GERD (gastroesophageal reflux disease)    History of kidney stones    Hypertension    Memory deficit    slight   Migraine    "controlled on daily RX " (06/11/2016)   Nephrolithiasis 07/30/2015   S/p lithotripsy x 3 and right and left ureter stents   OSA (obstructive sleep apnea)    cpap    PONV (postoperative nausea and vomiting)    Seizures (HCC)    "controlled w/daily RX; started in 2012; dr  thinks they might be from the migraines; last sz was early part of 2016" (06/11/2016)   Stroke (HCC)    Type II diabetes mellitus (HCC)    metformin   Vasovagal syncope 07/30/2015   2011     Family History  Problem Relation Age of Onset   Cancer Father    Diabetes Father    Heart disease Sister        CAD with MI   Heart attack Sister    Breast cancer Maternal Aunt    Diabetes Brother    Stroke Brother    Diabetes Brother    Heart disease Brother        CAD with CABG     Social History   Occupational History   Occupation: retired  Tobacco Use   Smoking status: Former    Current packs/day: 0.00    Average packs/day: 0.3 packs/day for 35.0 years (11.6 ttl pk-yrs)    Types: Cigarettes    Start date: 08/1985    Quit date: 08/2020    Years since quitting: 2.7   Smokeless tobacco: Former    Types: Snuff, Dorna Bloom    Quit date: 11/20/2012  Vaping Use   Vaping status: Never Used  Substance and Sexual Activity   Alcohol use: Yes    Comment: 06/11/2016 "might have a few drinks/month; usually wine"   Drug use: No   Sexual activity: Not Currently    Allergies  Allergen Reactions  Depakote [Divalproex Sodium] Other (See Comments)    Hyperammonemia   Other Other (See Comments)   Penicillins Hives, Other (See Comments) and Rash    Other reaction(s): GI intolerance Has patient had a PCN reaction causing immediate rash, facial/tongue/throat swelling, SOB or lightheadedness with hypotension: YES  Has patient had a PCN reaction causing severe rash involving mucus membranes or skin necrosis: NO  Has patient had a PCN reaction that required hospitalizationNO  Has patient had a PCN reaction occurring within the last 10 years: NO  If all of the above answers are "NO", then may proceed with Cephalosporin use.  Other reaction(s): GI Upset (intolerance)  Other Reaction(s): GI Intolerance  Other reaction(s): GI intolerance Has patient had a PCN reaction causing immediate rash,  facial/tongue/throat swelling, SOB or lightheadedness with hypotension: YES, Has patient had a PCN reaction causing severe rash involving mucus membranes or skin necrosis: NO, Has patient had a PCN reaction that required hospitalizationNO, Has patient had a PCN reaction occurring within the last 10 years: NO, If all of the above answers are "NO", then may proceed with Cephalosporin use.     Outpatient Medications Prior to Visit  Medication Sig Dispense Refill   acetaminophen (TYLENOL) 325 MG tablet Take 1-2 tablets (325-650 mg total) by mouth every 4 (four) hours as needed for mild pain.     albuterol (VENTOLIN HFA) 108 (90 Base) MCG/ACT inhaler Inhale 2 puffs into the lungs every 6 (six) hours as needed.     amLODipine (NORVASC) 5 MG tablet Take 5 mg by mouth daily.     ANORO ELLIPTA 62.5-25 MCG/ACT AEPB Inhale 1 puff into the lungs daily.     aspirin EC 81 MG tablet Take 1 tablet (81 mg total) by mouth daily. Swallow whole. 90 tablet 3   atorvastatin (LIPITOR) 80 MG tablet TAKE 1 TABLET BY MOUTH EVERY DAY 90 tablet 3   carvedilol (COREG) 3.125 MG tablet TAKE 1 TABLET BY MOUTH TWICE A DAY WITH A MEAL 180 tablet 3   clopidogrel (PLAVIX) 75 MG tablet Take 1 tablet (75 mg total) by mouth daily. 90 tablet 3   escitalopram (LEXAPRO) 10 MG tablet Take 1 tablet (10 mg total) by mouth at bedtime. 30 tablet 0   Evolocumab (REPATHA SURECLICK) 140 MG/ML SOAJ Inject 140 mg into the skin every 14 (fourteen) days. 6 mL 3   isosorbide mononitrate (IMDUR) 30 MG 24 hr tablet TAKE 1/2 OF A TABLET (15 MG TOTAL) BY MOUTH DAILY 45 tablet 3   levETIRAcetam (KEPPRA) 500 MG tablet Take 1 tablet (500 mg total) by mouth 2 (two) times daily. 180 tablet 3   lisinopril (ZESTRIL) 20 MG tablet TAKE 1 TABLET BY MOUTH TWICE A DAY 180 tablet 3   metFORMIN (GLUCOPHAGE-XR) 500 MG 24 hr tablet Take 500 mg by mouth 2 (two) times daily.     nitroGLYCERIN (NITROSTAT) 0.4 MG SL tablet Place 1 tablet (0.4 mg total) under the tongue every  5 (five) minutes x 3 doses as needed for chest pain. 25 tablet 4   nystatin cream (MYCOSTATIN) Apply 1 Application topically 2 (two) times daily.     vitamin B-12 (CYANOCOBALAMIN) 1000 MCG tablet Take 1,000 mcg by mouth daily.     scopolamine (TRANSDERM-SCOP) 1 MG/3DAYS Place 1 patch onto the skin every 3 (three) days.     metFORMIN (GLUCOPHAGE) 500 MG tablet Take 500 mg by mouth 2 (two) times daily with a meal.     No facility-administered medications prior to visit.  Review of Systems  Constitutional:  Negative for chills, diaphoresis, fever, malaise/fatigue and weight loss.  HENT:  Negative for congestion.   Respiratory:  Positive for shortness of breath. Negative for cough, hemoptysis, sputum production and wheezing.   Cardiovascular:  Negative for chest pain, palpitations and leg swelling.     Objective:   Vitals:   05/20/23 0906  BP: 124/66  Pulse: 73  Resp: 16  SpO2: 94%  Weight: 137 lb 11.2 oz (62.5 kg)  Height: 5\' 1"  (1.549 m)   SpO2: 94 %  Physical Exam: General: Well-appearing, no acute distress HENT: [place], AT Eyes: EOMI, no scleral icterus Respiratory: Clear to auscultation bilaterally.  No crackles, wheezing or rales Cardiovascular: RRR, -M/R/G, no JVD Extremities:-Edema,-tenderness Neuro: AAO x4, CNII-XII grossly intact Psych: Normal mood, normal affect  Data Reviewed:  Imaging: CT Chest Lung Screen 03/16/23 - Mild paraseptal emphysema. Diffuse peribronchovascular and subpleural groundglass and solid nodularity, new from 03/02/22. Scattered interstitial thickening. Solid pulmonary nodules in RUL up to 7.7 mm  PFT: 11/13/12 FVC 2.20 (80%) FEV1 1.63 (72%) Ratio 74 DLCO 83% Interpretation: No obstructive defect and normal DLCO  Labs: CBC    Component Value Date/Time   WBC 5.8 02/09/2021 0554   RBC 3.98 02/09/2021 0554   HGB 12.7 02/09/2021 0554   HGB 12.3 09/04/2018 1306   HCT 37.9 02/09/2021 0554   HCT 36.5 09/04/2018 1306   PLT 182 02/09/2021 0554    PLT 175 09/04/2018 1306   MCV 95.2 02/09/2021 0554   MCV 93 09/04/2018 1306   MCH 31.9 02/09/2021 0554   MCHC 33.5 02/09/2021 0554   RDW 12.3 02/09/2021 0554   RDW 12.6 09/04/2018 1306   LYMPHSABS 2.8 01/31/2021 0501   LYMPHSABS 1.7 09/15/2015 1026   MONOABS 0.7 01/31/2021 0501   EOSABS 0.2 01/31/2021 0501   EOSABS 0.2 09/15/2015 1026   BASOSABS 0.0 01/31/2021 0501   BASOSABS 0.0 09/15/2015 1026       Assessment & Plan:   Discussion: HPI  Emphysema --CONTINUE Anoro ONE puff ONCE a day  Abnormal CT --Scheduled for CT Lung Screen 06/16/23 --Discussed bronchoscopy if findings are persistent --Briefly discussed sarcoid however would need biopsy to confirm  Tobacco abuse Patient is an active smoker. We discussed smoking cessation for 10 minutes. We discussed triggers and stressors and ways to deal with them. We discussed barriers to continued smoking and benefits of smoking cessation. Provided patient with information cessation techniques and interventions including [place] quitline. --Step 2 nicotine x 2 weeks, then Step 3 x 2 weeks   Health Maintenance Immunization History  Administered Date(s) Administered   Fluad Quad(high Dose 65+) 06/17/2022   Influenza,inj,Quad PF,6+ Mos 05/24/2015   PFIZER Comirnaty(Gray Top)Covid-19 Tri-Sucrose Vaccine 12/22/2020   PFIZER(Purple Top)SARS-COV-2 Vaccination 06/20/2020   Pfizer(Comirnaty)Fall Seasonal Vaccine 12 years and older 06/17/2022   Pneumococcal Polysaccharide-23 06/11/2016, 01/09/2020   Tdap 03/18/2020   Zoster Recombinant(Shingrix) 03/18/2020   CT Lung Screen - as above  No orders of the defined types were placed in this encounter. No orders of the defined types were placed in this encounter.   Return for November.  I have spent a total time of 45-minutes on the day of the appointment reviewing prior documentation, coordinating care and discussing medical diagnosis and plan with the patient/family. Imaging, labs and  tests included in this note have been reviewed and interpreted independently by me.  Sakai Heinle Mechele Collin, MD [facility] 05/20/2023 9:51 AM  Office Number [phone]

## 2023-05-20 NOTE — Patient Instructions (Addendum)
Emphysema --CONTINUE Anoro ONE puff ONCE a day  Abnormal CT --Scheduled for CT Lung Screen 06/16/23 --Discussed bronchoscopy if findings are persistent --Briefly discussed sarcoid however would need biopsy to confirm  Tobacco abuse Patient is an active smoker. We discussed smoking cessation for 10 minutes. We discussed triggers and stressors and ways to deal with them. We discussed barriers to continued smoking and benefits of smoking cessation. Provided patient with information cessation techniques and interventions including [place] quitline. --Step 2 nicotine x 2 weeks, then Step 3 x 2 weeks

## 2023-06-16 ENCOUNTER — Ambulatory Visit (HOSPITAL_COMMUNITY)
Admission: RE | Admit: 2023-06-16 | Discharge: 2023-06-16 | Disposition: A | Discharge status: Home / Self Care | Payer: Medicare HMO | Source: Ambulatory Visit | Attending: Acute Care | Admitting: Acute Care

## 2023-06-16 DIAGNOSIS — Z87891 Personal history of nicotine dependence: Secondary | ICD-10-CM | POA: Diagnosis present

## 2023-06-16 DIAGNOSIS — R911 Solitary pulmonary nodule: Secondary | ICD-10-CM | POA: Diagnosis present

## 2023-06-21 ENCOUNTER — Other Ambulatory Visit: Payer: Self-pay | Admitting: Neurology

## 2023-07-11 ENCOUNTER — Encounter: Payer: Self-pay | Admitting: Cardiology

## 2023-07-11 ENCOUNTER — Other Ambulatory Visit: Payer: Self-pay

## 2023-07-11 ENCOUNTER — Ambulatory Visit: Payer: Medicare HMO | Attending: Cardiology | Admitting: Cardiology

## 2023-07-11 VITALS — BP 136/64 | HR 63 | Ht 61.0 in | Wt 133.8 lb

## 2023-07-11 DIAGNOSIS — R55 Syncope and collapse: Secondary | ICD-10-CM

## 2023-07-11 DIAGNOSIS — I10 Essential (primary) hypertension: Secondary | ICD-10-CM | POA: Diagnosis not present

## 2023-07-11 DIAGNOSIS — E785 Hyperlipidemia, unspecified: Secondary | ICD-10-CM | POA: Diagnosis not present

## 2023-07-11 DIAGNOSIS — I2510 Atherosclerotic heart disease of native coronary artery without angina pectoris: Secondary | ICD-10-CM

## 2023-07-11 DIAGNOSIS — R0602 Shortness of breath: Secondary | ICD-10-CM

## 2023-07-11 DIAGNOSIS — I6523 Occlusion and stenosis of bilateral carotid arteries: Secondary | ICD-10-CM | POA: Diagnosis not present

## 2023-07-11 DIAGNOSIS — G4733 Obstructive sleep apnea (adult) (pediatric): Secondary | ICD-10-CM

## 2023-07-11 DIAGNOSIS — Z79899 Other long term (current) drug therapy: Secondary | ICD-10-CM

## 2023-07-11 MED ORDER — ATORVASTATIN CALCIUM 40 MG PO TABS: 40.0000 mg | ORAL_TABLET | Freq: Every day | ORAL | 3 refills | Status: DC

## 2023-07-11 NOTE — Patient Instructions (Signed)
Medication Instructions:  Please DECREASE your dose of lipitor to 40 mg daily.  *If you need a refill on your cardiac medications before your next appointment, please call your pharmacy*   Lab Work: Please complete a FASTING lipid panel and an ALT in our lab in 6 weeks.   If you have labs (blood work) drawn today and your tests are completely normal, you will receive your results only by: MyChart Message (if you have MyChart) OR A paper copy in the mail If you have any lab test that is abnormal or we need to change your treatment, we will call you to review the results.   Testing/Procedures: None.   Follow-Up:   Your next appointment:   1 year(s)  Provider:   Armanda Magic, MD

## 2023-07-11 NOTE — Addendum Note (Signed)
Addended by: Luellen Pucker on: 07/11/2023 12:01 PM   Modules accepted: Orders

## 2023-07-11 NOTE — Progress Notes (Signed)
Date:  07/11/2023   ID:  Samantha Huffman, DOB 26-Nov-1955, MRN [id number]  PCP:  Irven Coe, MD  Cardiologist:  Armanda Magic, MD  Electrophysiologist:  None   Chief Complaint:  CAD, Carotid stenosis, HLD, HTN  History of Present Illness:    Samantha Huffman is a 67 y.o. female with a history of CAD status post CABG 10/2012 in [place], abnormal stress test 2017 but cath showed patent grafts to the OM and LAD and 30% RCA normal LV function. Patient has had periodic chest pain and was placed on Imdur for possible microvascular angina. PCP was treating her for anxiety. 2D echo 07/2018 EF 60 to 65% with grade 1 DD.   Patient underwent cardiac catheterization 09/04/2018 due to ongoing CP and found to have patent LIMA to the LAD, patent SVGs and radial graft to the OM, normal LV function, low LV filling pressures. Chlorthalidone and Lasix were held because of low filling pressures and blood pressure otherwise medical therapy continued. She also has HLD, HTN, DM2, OSA on CPAP and carotid artery dz as well as tobacco abuse.  She was seen by Jari Favre, PA on 04/06/2023 for and was doing well.  She is here today for followup and is doing well.  She denies any chest pain or pressure, SOB, DOE, PND, orthopnea, LE edema, dizziness, palpitations or syncope. She is compliant with her meds and is tolerating meds with no SE.     Prior CV studies:   The following studies were reviewed today:  none  Past Medical History:  Diagnosis Date   Carotid artery stenosis    1-39% bilateral stenosis by dopplers 08/2022   Coronary artery disease    a. s/p CABG 10/2012.  cath 05/2016 with moderate LM stenosis, mild LCx and RCA stenosis and occluded LAD with patent LIMA to LAD, free radial to OM.  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   Dyslipidemia    Family history of adverse reaction to anesthesia    mother and sister ponv   Generalized convulsive epilepsy without mention of intractable epilepsy    GERD  (gastroesophageal reflux disease)    History of kidney stones    Hypertension    Memory deficit    slight   Migraine    "controlled on daily RX " (06/11/2016)   Nephrolithiasis 07/30/2015   S/p lithotripsy x 3 and right and left ureter stents   OSA (obstructive sleep apnea)    cpap    PONV (postoperative nausea and vomiting)    Seizures (HCC)    "controlled w/daily RX; started in 2012; dr thinks they might be from the migraines; last sz was early part of 2016" (06/11/2016)   Stroke (HCC)    Type II diabetes mellitus (HCC)    metformin   Vasovagal syncope 07/30/2015   2011   Past Surgical History:  Procedure Laterality Date   CARDIAC CATHETERIZATION  2014; 06/11/2016   CARDIAC CATHETERIZATION N/A 06/11/2016   Procedure: Left Heart Cath and Cors/Grafts Angiography;  Surgeon: Tonny Bollman, MD;  [facility] INVASIVE CV LAB;  Service: Cardiovascular;  Laterality: N/A;   CESAREAN SECTION     CORONARY ANGIOPLASTY     CORONARY ARTERY BYPASS GRAFT  March, 2014   LIMA to LAD, left radial to LCx x 2   CYSTOSCOPY W/ URETERAL STENT PLACEMENT Left 01/16/2019   Procedure: CYSTOSCOPY WITH RETROGRADE PYELOGRAM/URETERAL LEFT STENT PLACEMENT;  Surgeon: Crist Fat, MD;  Location: [facility];  Service: Urology;  Laterality: Left;   CYSTOSCOPY/URETEROSCOPY/HOLMIUM LASER/STENT PLACEMENT Left 01/24/2019   Procedure: CYSTOSCOPY, LEFT URETEROSCOPY, HOLMIUM LASER, STONE REMOVAL AND STENT EXCHANGE;  Surgeon: Crist Fat, MD;  Location: [facility];  Service: Urology;  Laterality: Left;   LITHOTRIPSY  X 3   RIGHT/LEFT HEART CATH AND CORONARY/GRAFT ANGIOGRAPHY N/A 09/08/2018   Procedure: RIGHT/LEFT HEART CATH AND CORONARY/GRAFT ANGIOGRAPHY;  Surgeon: [place], Peter M, MD;  Location: [facility] INVASIVE CV LAB;  Service: Cardiovascular;  Laterality: N/A;   TOTAL ABDOMINAL HYSTERECTOMY     ovaries took before hysterectomy   TUBAL LIGATION     URETERAL STENT PLACEMENT       Current Meds  Medication Sig    acetaminophen (TYLENOL) 325 MG tablet Take 1-2 tablets (325-650 mg total) by mouth every 4 (four) hours as needed for mild pain.   albuterol (VENTOLIN HFA) 108 (90 Base) MCG/ACT inhaler Inhale 2 puffs into the lungs every 6 (six) hours as needed.   amLODipine (NORVASC) 5 MG tablet Take 5 mg by mouth daily.   ANORO ELLIPTA 62.5-25 MCG/ACT AEPB Inhale 1 puff into the lungs daily.   aspirin EC 81 MG tablet Take 1 tablet (81 mg total) by mouth daily. Swallow whole.   atorvastatin (LIPITOR) 80 MG tablet TAKE 1 TABLET BY MOUTH EVERY DAY   carvedilol (COREG) 3.125 MG tablet TAKE 1 TABLET BY MOUTH TWICE A DAY WITH A MEAL   clopidogrel (PLAVIX) 75 MG tablet Take 1 tablet (75 mg total) by mouth daily.   escitalopram (LEXAPRO) 10 MG tablet Take 1 tablet (10 mg total) by mouth at bedtime.   Evolocumab (REPATHA SURECLICK) 140 MG/ML SOAJ Inject 140 mg into the skin every 14 (fourteen) days.   isosorbide mononitrate (IMDUR) 30 MG 24 hr tablet TAKE 1/2 OF A TABLET (15 MG TOTAL) BY MOUTH DAILY   levETIRAcetam (KEPPRA) 500 MG tablet TAKE 1 TABLET BY MOUTH TWICE A DAY   lisinopril (ZESTRIL) 20 MG tablet TAKE 1 TABLET BY MOUTH TWICE A DAY   metFORMIN (GLUCOPHAGE-XR) 500 MG 24 hr tablet Take 500 mg by mouth 2 (two) times daily.   nitroGLYCERIN (NITROSTAT) 0.4 MG SL tablet Place 1 tablet (0.4 mg total) under the tongue every 5 (five) minutes x 3 doses as needed for chest pain.   nystatin cream (MYCOSTATIN) Apply 1 Application topically 2 (two) times daily.   vitamin B-12 (CYANOCOBALAMIN) 1000 MCG tablet Take 1,000 mcg by mouth daily.     Allergies:   Depakote [divalproex sodium], Other, and Penicillins   Social History   Tobacco Use   Smoking status: Former    Current packs/day: 0.00    Average packs/day: 0.3 packs/day for 35.0 years (11.6 ttl pk-yrs)    Types: Cigarettes    Start date: 08/1985    Quit date: 08/2020    Years since quitting: 2.8   Smokeless tobacco: Former    Types: Snuff, Dorna Bloom    Quit  date: 11/20/2012  Vaping Use   Vaping status: Never Used  Substance Use Topics   Alcohol use: Yes    Comment: 06/11/2016 "might have a few drinks/month; usually wine"   Drug use: No     Family Hx: The patient's family history includes Breast cancer in her maternal aunt; Cancer in her father; Diabetes in her brother, brother, and father; Heart attack in her sister; Heart disease in her brother and sister; Stroke in her brother.  ROS:   Please see the history of present illness.     All other systems  reviewed and are negative.   Labs/Other Tests and Data Reviewed:         Recent Labs: No results found for requested labs within last 365 days.   Recent Lipid Panel Lab Results  Component Value Date/Time   CHOL 86 (L) 01/25/2022 11:31 AM   TRIG 145 01/25/2022 11:31 AM   HDL 43 01/25/2022 11:31 AM   CHOLHDL 2.0 01/25/2022 11:31 AM   CHOLHDL 2.9 01/21/2021 05:08 AM   LDLCALC 18 01/25/2022 11:31 AM   LDLCALC 44 08/18/2020 09:45 AM   LDLDIRECT 67 06/13/2018 03:17 PM    Wt Readings from Last 3 Encounters:  07/11/23 133 lb 12.8 oz (60.7 kg)  05/20/23 137 lb 11.2 oz (62.5 kg)  04/08/23 131 lb (59.4 kg)     Objective:    Vital Signs:  BP 136/64   Pulse 63   Ht 5\' 1"  (1.549 m)   Wt 133 lb 12.8 oz (60.7 kg)   SpO2 97%   BMI 25.28 kg/m   GEN: Well nourished, well developed in no acute distress HEENT: Normal NECK: No JVD; No carotid bruits LYMPHATICS: No lymphadenopathy CARDIAC:RRR, no murmurs, rubs, gallops RESPIRATORY:  Clear to auscultation without rales, wheezing or rhonchi  ABDOMEN: Soft, non-tender, non-distended MUSCULOSKELETAL:  No edema; No deformity  SKIN: Warm and dry NEUROLOGIC:  Alert and oriented x 3 PSYCHIATRIC:  Normal affect  ASSESSMENT & PLAN:    1. ASCAD  -CABG x2 in 2014 with LIMA to the LAD and free radial to the OM with recent cath 09/04/2018 with patent LIMA to the LAD and patent free radial graft to the OM.  -She has not had any chest pain or  shortness of breath since I saw her last -Continue prescription drug management with aspirin 81 mg daily, atorvastatin 80 mg daily, carvedilol 3.125 mg twice daily, Plavix 75 mg daily, Imdur 30 mg 1/2 tablet daily with as needed refills  2. HTN  -BP is controlled on exam today -Continue prescription management with amlodipine 5 mg daily, carvedilol 3.125 mg twice daily, lisinopril 20 mg twice daily with as needed refills  3. Hyperlipidemia  -LDL goal is < 70.  -I have personally reviewed and interpreted outside labs performed by patient's PCP which showed LDL 15 and HDL 40 on 02/15/2023 with ALT 11 -Continue prescription drug management with Repatha -Decrease atorvastatin to 40 mg daily due to very low LDL -Repeat FLP and CMET in 6 weeks  4. Carotid artery stenosis  -dopplers 08/2020 and 09/11/2022 showed 1-39% bilateral stenosis  -continue ASA and statin.    5.  OSA  -minimal with AHI 5.7/hr -intolerant to CPAP   7.  SOB -this has resolved -cath 08/2018 showed patent grafts and SOB has not changed since then -LVF is normal  8.  Syncope -Felt to be orthostatic in nature on prior visits -2D echo Showed normal LV function   -heart monitor showed sinus rhythm with 1 5 beat run of NSVT, 1 episode of paroxysmal atrial tachycardia and occasional PVCs -She has not had any further syncope and no palpitations   Medication Adjustments/Labs and Tests Ordered: Current medicines are reviewed at length with the patient today.  Concerns regarding medicines are outlined above.  Tests Ordered: No orders of the defined types were placed in this encounter.  Medication Changes: No orders of the defined types were placed in this encounter.   Disposition:  Follow up in 1 year(s)  Signed, Armanda Magic, MD  07/11/2023 11:45 AM  [facility]

## 2023-07-20 ENCOUNTER — Other Ambulatory Visit: Payer: Self-pay

## 2023-07-20 ENCOUNTER — Telehealth: Payer: Self-pay

## 2023-07-20 DIAGNOSIS — Z87891 Personal history of nicotine dependence: Secondary | ICD-10-CM

## 2023-07-20 DIAGNOSIS — F17210 Nicotine dependence, cigarettes, uncomplicated: Secondary | ICD-10-CM

## 2023-07-20 DIAGNOSIS — Z122 Encounter for screening for malignant neoplasm of respiratory organs: Secondary | ICD-10-CM

## 2023-07-20 NOTE — Telephone Encounter (Signed)
Called patient and reviewed results. Advised plan is to complete a repeat CT in 6 months. Reviewed results with daughter. Verbalized understanding. Repeat CT order placed.

## 2023-07-27 ENCOUNTER — Encounter (HOSPITAL_BASED_OUTPATIENT_CLINIC_OR_DEPARTMENT_OTHER): Payer: Self-pay | Admitting: Pulmonary Disease

## 2023-07-27 ENCOUNTER — Ambulatory Visit (HOSPITAL_BASED_OUTPATIENT_CLINIC_OR_DEPARTMENT_OTHER): Payer: Medicare HMO | Admitting: Pulmonary Disease

## 2023-07-27 VITALS — BP 122/70 | HR 68 | Resp 16 | Ht 61.0 in | Wt 137.8 lb

## 2023-07-27 DIAGNOSIS — R918 Other nonspecific abnormal finding of lung field: Secondary | ICD-10-CM | POA: Diagnosis not present

## 2023-07-27 DIAGNOSIS — D869 Sarcoidosis, unspecified: Secondary | ICD-10-CM

## 2023-07-27 NOTE — Progress Notes (Signed)
Subjective:   PATIENT ID: Samantha Huffman GENDER: female DOB: 11-17-1955, MRN: [id number]  Chief Complaint  Patient presents with   Follow-up    Emphysema - fine    Reason for Visit: Follow-up sarcoid  Ms. Charlott Pflanz is a 67 year old female active smoker with allergic rhinitis, emphysema, HTN, DM2, HLD, hx CVA with residual right hemiparesis who presents for evaluation for abnormal CT scan. Daughter present and provides additional history.  Initial consult She was started on Anoro one month ago and has improved her cough. Denies wheezing. Has fatigue and needs breaks when performing cooking, vacuuming, clearing at a slow pace. Needs assistance with bathing and upper body activities (taking out trash). Uses a cane to ambulate. She report smokes between 1/4-1/2 ppd. She started regularly smoking in her 12s. Denies vaping. Denies essential oil use.  07/27/23 Since our last visit she reports cough has improved. Has not been consistently taking asthma. Still smoking 1/4-1/2 ppd. Not interested in quitting at this time. Stress triggers. Daughter reports she smokes out of boredom. No shortness of breath or wheezing  Social History: Retired. Previous govt clerical work   Past Medical History:  Diagnosis Date   Carotid artery stenosis    1-39% bilateral stenosis by dopplers 08/2022   Coronary artery disease    a. s/p CABG 10/2012.  cath 05/2016 with moderate LM stenosis, mild LCx and RCA stenosis and occluded LAD with patent LIMA to LAD, free radial to OM.  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   Dyslipidemia    Family history of adverse reaction to anesthesia    mother and sister ponv   Generalized convulsive epilepsy without mention of intractable epilepsy    GERD (gastroesophageal reflux disease)    History of kidney stones    Hypertension    Memory deficit    slight   Migraine    "controlled on daily RX " (06/11/2016)   Nephrolithiasis 07/30/2015   S/p lithotripsy x 3 and  right and left ureter stents   OSA (obstructive sleep apnea)    cpap    PONV (postoperative nausea and vomiting)    Seizures (HCC)    "controlled w/daily RX; started in 2012; dr thinks they might be from the migraines; last sz was early part of 2016" (06/11/2016)   Stroke (HCC)    Type II diabetes mellitus (HCC)    metformin   Vasovagal syncope 07/30/2015   2011     Family History  Problem Relation Age of Onset   Cancer Father    Diabetes Father    Heart disease Sister        CAD with MI   Heart attack Sister    Breast cancer Maternal Aunt    Diabetes Brother    Stroke Brother    Diabetes Brother    Heart disease Brother        CAD with CABG     Social History   Occupational History   Occupation: retired  Tobacco Use   Smoking status: Former    Current packs/day: 0.00    Average packs/day: 0.3 packs/day for 35.0 years (11.6 ttl pk-yrs)    Types: Cigarettes    Start date: 08/1985    Quit date: 08/2020    Years since quitting: 2.9   Smokeless tobacco: Former    Types: Snuff, Dorna Bloom    Quit date: 11/20/2012  Vaping Use   Vaping status: Never Used  Substance and Sexual Activity   Alcohol use:  Yes    Comment: 06/11/2016 "might have a few drinks/month; usually wine"   Drug use: No   Sexual activity: Not Currently    Allergies  Allergen Reactions   Depakote [Divalproex Sodium] Other (See Comments)    Hyperammonemia   Other Other (See Comments)   Penicillins Hives, Other (See Comments) and Rash    Other reaction(s): GI intolerance Has patient had a PCN reaction causing immediate rash, facial/tongue/throat swelling, SOB or lightheadedness with hypotension: YES  Has patient had a PCN reaction causing severe rash involving mucus membranes or skin necrosis: NO  Has patient had a PCN reaction that required hospitalizationNO  Has patient had a PCN reaction occurring within the last 10 years: NO  If all of the above answers are "NO", then may proceed with Cephalosporin  use.  Other reaction(s): GI Upset (intolerance)  Other Reaction(s): GI Intolerance  Other reaction(s): GI intolerance Has patient had a PCN reaction causing immediate rash, facial/tongue/throat swelling, SOB or lightheadedness with hypotension: YES, Has patient had a PCN reaction causing severe rash involving mucus membranes or skin necrosis: NO, Has patient had a PCN reaction that required hospitalizationNO, Has patient had a PCN reaction occurring within the last 10 years: NO, If all of the above answers are "NO", then may proceed with Cephalosporin use.     Outpatient Medications Prior to Visit  Medication Sig Dispense Refill   acetaminophen (TYLENOL) 325 MG tablet Take 1-2 tablets (325-650 mg total) by mouth every 4 (four) hours as needed for mild pain.     albuterol (VENTOLIN HFA) 108 (90 Base) MCG/ACT inhaler Inhale 2 puffs into the lungs every 6 (six) hours as needed.     amLODipine (NORVASC) 5 MG tablet Take 5 mg by mouth daily.     ANORO ELLIPTA 62.5-25 MCG/ACT AEPB Inhale 1 puff into the lungs daily.     aspirin EC 81 MG tablet Take 1 tablet (81 mg total) by mouth daily. Swallow whole. 90 tablet 3   atorvastatin (LIPITOR) 40 MG tablet Take 1 tablet (40 mg total) by mouth daily. 90 tablet 3   carvedilol (COREG) 3.125 MG tablet TAKE 1 TABLET BY MOUTH TWICE A DAY WITH A MEAL 180 tablet 3   clopidogrel (PLAVIX) 75 MG tablet Take 1 tablet (75 mg total) by mouth daily. 90 tablet 3   escitalopram (LEXAPRO) 10 MG tablet Take 1 tablet (10 mg total) by mouth at bedtime. 30 tablet 0   Evolocumab (REPATHA SURECLICK) 140 MG/ML SOAJ Inject 140 mg into the skin every 14 (fourteen) days. 6 mL 3   isosorbide mononitrate (IMDUR) 30 MG 24 hr tablet TAKE 1/2 OF A TABLET (15 MG TOTAL) BY MOUTH DAILY 45 tablet 3   levETIRAcetam (KEPPRA) 500 MG tablet TAKE 1 TABLET BY MOUTH TWICE A DAY 180 tablet 3   lisinopril (ZESTRIL) 20 MG tablet TAKE 1 TABLET BY MOUTH TWICE A DAY 180 tablet 3   metFORMIN  (GLUCOPHAGE-XR) 500 MG 24 hr tablet Take 500 mg by mouth 2 (two) times daily.     nitroGLYCERIN (NITROSTAT) 0.4 MG SL tablet Place 1 tablet (0.4 mg total) under the tongue every 5 (five) minutes x 3 doses as needed for chest pain. 25 tablet 4   nystatin cream (MYCOSTATIN) Apply 1 Application topically 2 (two) times daily.     vitamin B-12 (CYANOCOBALAMIN) 1000 MCG tablet Take 1,000 mcg by mouth daily.     No facility-administered medications prior to visit.    Review of Systems  Constitutional:  Negative for chills, diaphoresis, fever, malaise/fatigue and weight loss.  HENT:  Negative for congestion.   Respiratory:  Negative for cough, hemoptysis, sputum production, shortness of breath and wheezing.   Cardiovascular:  Negative for chest pain, palpitations and leg swelling.     Objective:   Vitals:   07/27/23 0953  BP: 122/70  Pulse: 68  Resp: 16  SpO2: 96%  Weight: 137 lb 12.8 oz (62.5 kg)  Height: 5\' 1"  (1.549 m)    SpO2: 96 %  Physical Exam: General: Well-appearing, no acute distress HENT: [place], AT Eyes: EOMI, no scleral icterus Respiratory: Clear to auscultation bilaterally.  No crackles, wheezing or rales Cardiovascular: RRR, -M/R/G, no JVD Extremities:-Edema,-tenderness Neuro: AAO x4, CNII-XII grossly intact Psych: Normal mood, normal affect  Data Reviewed:  Imaging: CT Chest Lung Screen 03/16/23 - Mild paraseptal emphysema. Diffuse peribronchovascular and subpleural groundglass and solid nodularity, new from 03/02/22. Scattered interstitial thickening. Solid pulmonary nodules in RUL up to 7.7 mm CT Chest Lung screen 06/16/23. Centrilobular emphysema.  Bilateral ndoules with prior RUL nodule decreased in size. Unchanged ground glass opacities. Interval 4.1 mm subpleural right 4.1 right apical nodule  PFT: 11/13/12 FVC 2.20 (80%) FEV1 1.63 (72%) Ratio 74 DLCO 83% Interpretation: No obstructive defect and normal DLCO  Labs: CBC    Component Value Date/Time   WBC  5.8 02/09/2021 0554   RBC 3.98 02/09/2021 0554   HGB 12.7 02/09/2021 0554   HGB 12.3 09/04/2018 1306   HCT 37.9 02/09/2021 0554   HCT 36.5 09/04/2018 1306   PLT 182 02/09/2021 0554   PLT 175 09/04/2018 1306   MCV 95.2 02/09/2021 0554   MCV 93 09/04/2018 1306   MCH 31.9 02/09/2021 0554   MCHC 33.5 02/09/2021 0554   RDW 12.3 02/09/2021 0554   RDW 12.6 09/04/2018 1306   LYMPHSABS 2.8 01/31/2021 0501   LYMPHSABS 1.7 09/15/2015 1026   MONOABS 0.7 01/31/2021 0501   EOSABS 0.2 01/31/2021 0501   EOSABS 0.2 09/15/2015 1026   BASOSABS 0.0 01/31/2021 0501   BASOSABS 0.0 09/15/2015 1026       Assessment & Plan:   Discussion: 67 year old female active smoker with allergic rhinitis, emphysema, HTN, DM2, HLD, hx CVA with residual right hemiparesis who presents for follow-up for CT scan, COPD and sarcoid.  Imaging findings suggestive of sarcoid with most recent scan in October with improved RUL nodule size but there is interval subcentimeter subpleural nodule that will require monitoring.   We discussed the clinical course of sarcoid and management including serial PFTs, labs, eye exam, and EKG and chest imaging if indicated. If symptoms suggest sarcoid flare in the future, we would need to consider biopsy to confirm sarcoid diagnosis and management considered would be with steroids +/- biologics.  Discussed clinical course and management of COPD including bronchodilator regimen compliance, preventive care and action plan for exacerbation.  Emphysema --CONTINUE Anoro ONE puff ONCE a day  Abnormal CT Multiple lung nodules --Reviewed CT lung screen 06/16/23. Improved RUL nodule. Interval 4.1 mm subpleural right apical nodule --Scheduled for CT Lung Screen June 2025 --No indication bronchoscopy at this point  Tobacco abuse Patient is an active smoker. We discussed smoking cessation for 5 minutes. We discussed triggers and stressors and ways to deal with them. We discussed barriers to  continued smoking and benefits of smoking cessation. Provided patient with information cessation techniques and interventions including [place] quitline. --Step 2 nicotine x 4 weeks, then Step 3 x 4 weeks   Health Maintenance  Immunization History  Administered Date(s) Administered   Fluad Quad(high Dose 65+) 06/17/2022   Influenza,inj,Quad PF,6+ Mos 05/24/2015   Influenza-Unspecified 07/04/2023   PFIZER Comirnaty(Gray Top)Covid-19 Tri-Sucrose Vaccine 12/22/2020   PFIZER(Purple Top)SARS-COV-2 Vaccination 06/20/2020   Pfizer(Comirnaty)Fall Seasonal Vaccine 12 years and older 06/17/2022   Pneumococcal Polysaccharide-23 06/11/2016, 01/09/2020   Tdap 03/18/2020   Zoster Recombinant(Shingrix) 03/18/2020   CT Lung Screen - as above  Orders Placed This Encounter  Procedures   CT Chest Wo Contrast    Standing Status:   Future    Standing Expiration Date:   07/26/2024    Scheduling Instructions:     Schedule in 6 months (June 2025)    Order Specific Question:   Preferred imaging location?    Answer:   [facility]  No orders of the defined types were placed in this encounter.   Return in about 6 months (around 01/25/2024) for after CT scan with NP Groce.  I have spent a total time of 30-minutes on the day of the appointment including chart review, data review, collecting history, coordinating care and discussing medical diagnosis and plan with the patient/family. Past medical history, allergies, medications were reviewed. Pertinent imaging, labs and tests included in this note have been reviewed and interpreted independently by me.  Breeana Sawtelle Mechele Collin, MD [facility] 07/27/2023 11:30 AM  Office Number [phone]

## 2023-07-27 NOTE — Patient Instructions (Signed)
Emphysema --CONTINUE Anoro ONE puff ONCE a day  Abnormal CT Multiple lung nodules --Reviewed CT lung screen 06/16/23. Improved RUL nodule. Interval 4.1 mm subpleural right apical nodule --Scheduled for CT Lung Screen June 2025 --No indication bronchoscopy at this point  Tobacco abuse Patient is an active smoker. We discussed smoking cessation for 5 minutes. We discussed triggers and stressors and ways to deal with them. We discussed barriers to continued smoking and benefits of smoking cessation. Provided patient with information cessation techniques and interventions including [place] quitline. --Step 2 nicotine x 4 weeks, then Step 3 x 4 weeks

## 2023-08-19 ENCOUNTER — Telehealth: Payer: Self-pay | Admitting: Pulmonary Disease

## 2023-08-19 NOTE — Telephone Encounter (Signed)
Called patient back. Prior notes do not require labs and do not mention change in medication.  After discussion, daughter meant to contact Cardiology office regarding reduction in atorvastatin dose and repeat labs.  I have advised patient and daughter to discuss obtaining labs (FLP and CMET) with their primary care physician as well with Dr. Lewie Chamber on 09/08/23.  Patient and daughter expressed appreciation for call. Closing encounter.

## 2023-08-19 NOTE — Telephone Encounter (Signed)
Pt daughter (verified per DPR)  calling in to see about if her mother is needing labs work done, she was told a order would be placed for labs. Also the dosage was changed on her medication, her daughter can't remember which one please advise.

## 2023-10-20 ENCOUNTER — Other Ambulatory Visit: Payer: Self-pay | Admitting: Urology

## 2023-10-20 ENCOUNTER — Emergency Department (HOSPITAL_BASED_OUTPATIENT_CLINIC_OR_DEPARTMENT_OTHER)
Admission: EM | Admit: 2023-10-20 | Discharge: 2023-10-20 | Disposition: A | Discharge status: Home / Self Care | Payer: Medicare HMO | Source: Home / Self Care

## 2023-10-20 ENCOUNTER — Other Ambulatory Visit: Payer: Self-pay

## 2023-10-21 ENCOUNTER — Encounter (HOSPITAL_COMMUNITY): Payer: Self-pay | Admitting: Urology

## 2023-10-21 NOTE — Progress Notes (Signed)
 COVID Vaccine Completed:  Yes  Date of COVID positive in last 90 days:  PCP - Irven Coe, MD Cardiologist - Armanda Magic, MD Pulmonology - Chi Mechele Collin, MD  Chest x-ray -  CT chest 06-16-23 Epic EKG - 02-11-23 Epic Stress Test - 11-29-17 Epic ECHO - 03-15-23 Epic Cardiac Cath - 09-08-18 Epic Pacemaker/ICD device last checked: Spinal Cord Stimulator: Long Term Monitor - 03-17-23 Epic  Bowel Prep -   Sleep Study - yes, +sleep apnea CPAP - No longer needed after weight loss   Fasting Blood Sugar -  Checks Blood Sugar - does not check   Last dose of GLP1 agonist-  N/A GLP1 instructions:  Hold 7 days before surgery    Last dose of SGLT-2 inhibitors-  N/A SGLT-2 instructions:  Hold 3 days before surgery   Blood Thinner Instructions:  Plavix.  Per patient and daughter to stay on Aspirin Instructions:  ASA 325.  To stay on per patient and daughter Last Dose:  Activity level:  Can go up a flight of stairs and perform activities of daily living without stopping and without symptoms of chest pain or shortness of breath.  Anesthesia review:  CAD s/p CABG, carotid artery stenosis, HTN, hx of CVA (some speech deficits and balance issues), OSA (no CPAP)  Seizure disorder (no seizure in several years)  Patient denies shortness of breath, fever, cough and chest pain at PAT appointment (completed over the phone)  Patient verbalized understanding of instructions that were given to them at the PAT appointment. Patient was also instructed that they will need to review over the PAT instructions again at home before surgery.

## 2023-10-21 NOTE — H&P (Signed)
 Office Visit Report     10/20/2023   --------------------------------------------------------------------------------   Samantha Huffman  MRN: [id number]  DOB: [birth year], 68 year old Female  SSN:    PRIMARY CARE:  Irven Coe, MD  PRIMARY CARE FAX:  [phone]  REFERRING:  Irven Coe, MD  PROVIDER:  Jerilee Field, M.D.  LOCATION:  [facility] [phone]     --------------------------------------------------------------------------------   CC/HPI: New patient seen previously-   1) gross hematuria-patient voided January 2025 and noted red urine. Her UA showed 5-20 red blood cells per high-powered field. Creatinine was 0.67.   2) urolithiasis-she was treated with ureteroscopy in 2020. She had a CT scan in 2021 that showed punctate bilateral stones.   Today, seen for the above. She is left flank pain since Dec 2024. She thinks the left side LLQ is tender and swollen. No stone passage. She drinks pineapple-mango water.   CT today (discussed with family prelim reading) with a 6 mm ball valving type left renal pelvic stone. Visible on the scout. There is a 12 mm left lower pole stone. HU about 1200. Left extrarenal pelvis which is stable and will likely persist on imaging.   She is on Plavix - clopidogrel. Cardiology. 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     ALLERGIES: Anesthesia - Vomiting penicillin - rash on hands & feet    MEDICATIONS: Lisinopril 1 PO Daily  Metformin Hcl 1 PO Daily  Amlodipine Besylate 5 mg tablet 1 tablet PO Daily  Atorvastatin Calcium 1 PO Daily  Carvedilol 3.125 mg tablet 1 tablet PO Daily  Clopidogrel 1 PO Daily  Escitalopram Oxalate 1 PO Daily  Isosorbide 1 PO Daily  Levetiracetam 1 PO Daily  Repatha Sureclick 140 mg/ml pen injector PO Daily     GU PSH: Cystoscopy Insert Stent, Left - 2020 ESWL - 2016 Ureteroscopic laser litho, Left - 2020       PSH Notes: Renal Lithotripsy   NON-GU PSH: CABG (coronary artery bypass  grafting) Cardiac Stent Placement - 2014     GU PMH: Renal calculus, Asymptomatic - 2021, Stable punctate appearing non-obstructing calculi bilaterally on recent CT imaging. She will continue K citrate therapy as directed. I'll consider repeating a 24 hour UA in the near future., - 2021, Nephrolithiasis, - 2016 Microscopic hematuria, Current everyday smoker having quit this past winter but has recently restarted smoking. - 2021 Flank Pain - 2020      PMH Notes:  2015-08-13 11:55:00 - Note: Seizure   NON-GU PMH: Trichomonal vulvovaginitis, patient is having vaginal itching - 2021 Hypercalciuria, Hypercalciuria - 2017 Encounter for general adult medical examination without abnormal findings, Encounter for preventive health examination - 2016 Personal history of other diseases of the circulatory system, History of cardiac disorder - 2016, History of hypertension, - 2016 Personal history of other diseases of the nervous system and sense organs, History of sleep apnea - 2016 Personal history of other diseases of the respiratory system, History of asthma - 2016 Personal history of other endocrine, nutritional and metabolic disease, History of hypercholesterolemia - 2016 Personal history of other mental and behavioral disorders, History of depression - 2016 Diabetes Type 2    FAMILY HISTORY: Death of family member - Runs In Family Diabetes - Runs In Family Prostate Cancer - Runs In Family Seizure - Runs In Family   SOCIAL HISTORY: Marital Status: Single Preferred Language: English; Ethnicity: Not Hispanic Or Latino; Race: Black or African American Current Smoking Status: Patient smokes.  Smokes 1/2 pack per day.   Tobacco Use Assessment Completed: Used Tobacco in last 30 days? Social Drinker.  Drinks 1 caffeinated drink per day.    REVIEW OF SYSTEMS:    GU Review Female:   blood in urine.  Patient reports frequent urination, hard to postpone urination, and get up at night to urinate.  Patient denies burning /pain with urination, leakage of urine, stream starts and stops, trouble starting your stream, have to strain to urinate, and being pregnant.  Gastrointestinal (Upper):   Patient reports nausea and vomiting. Patient denies indigestion/ heartburn.  Gastrointestinal (Lower):   Patient denies diarrhea and constipation.  Constitutional:   Patient denies fever, night sweats, weight loss, and fatigue.  Skin:   Patient denies skin rash/ lesion and itching.  Eyes:   Patient denies blurred vision and double vision.  Ears/ Nose/ Throat:   Patient denies sore throat and sinus problems.  Hematologic/Lymphatic:   Patient denies swollen glands and easy bruising.  Cardiovascular:   Patient denies leg swelling and chest pains.  Respiratory:   Patient denies cough and shortness of breath.  Endocrine:   Patient denies excessive thirst.  Musculoskeletal:   Patient denies back pain and joint pain.  Neurological:   Patient denies headaches and dizziness.  Psychologic:   Patient denies depression and anxiety.   VITAL SIGNS: None   MULTI-SYSTEM PHYSICAL EXAMINATION:    Constitutional: Well-nourished. No physical deformities. Normally developed. Good grooming.  Neck: Neck symmetrical, not swollen. Normal tracheal position.  Respiratory: No labored breathing, no use of accessory muscles.   Cardiovascular: Normal temperature, normal extremity pulses, no swelling, no varicosities.  Skin: No paleness, no jaundice, no cyanosis. No lesion, no ulcer, no rash.  Neurologic / Psychiatric: Oriented to time, oriented to place, oriented to person. No depression, no anxiety, no agitation.  Gastrointestinal: No mass, no tenderness, no rigidity, non obese abdomen.  Eyes: Normal conjunctivae. Normal eyelids.  Ears, Nose, Mouth, and Throat: Left ear no scars, no lesions, no masses. Right ear no scars, no lesions, no masses. Nose no scars, no lesions, no masses. Normal hearing. Normal lips.  Musculoskeletal:  Normal gait and station of head and neck.     Complexity of Data:  Lab Test Review:   BUN/Creatinine  Records Review:   Previous Doctor Records  X-Ray Review: C.T. Abdomen/Pelvis: Reviewed Films. Discussed With Patient. 2021    PROCEDURES:         C.T. Urogram - [id number]      Patient confirmed No Neulasta OnPro Device.         Urinalysis w/Scope - 81001 Dipstick Dipstick Cont'd Micro  Color: Yellow Bilirubin: Neg WBC/hpf: 0 - 5/hpf  Appearance: Slightly Cloudy Ketones: Neg RBC/hpf: 3 - 10/hpf  Specific Gravity: 1.015 Blood: 3+ Bacteria: Rare (0-9/hpf)  pH: 6.0 Protein: 1+ Cystals: NS (Not Seen)  Glucose: Neg Urobilinogen: 0.2 Casts: NS (Not Seen)    Nitrites: Neg Trichomonas: Not Present    Leukocyte Esterase: 1+ Mucous: Not Present      Epithelial Cells: NS (Not Seen)      Yeast: NS (Not Seen)      Sperm: Not Present    Notes:  micro performed on unspun urine due to QNS     ASSESSMENT:      ICD-10 Details  1 GU:   Renal calculus - N20.0 Chronic, Stable - Went over the nature risk and benefits of continued surveillance, ureteroscopy or shockwave lithotripsy. All questions answered. She will proceed with ureteroscopy ASAP.  Will get clearance. ER precautions and instructions given the patient and family.  2   Gross hematuria - R31.0 Chronic, Stable   PLAN:            Medications New Meds: Tamsulosin Hcl 0.4 mg capsule 1 capsule PO Q HS   #30  1 Refill(s)  Oxycodone-Acetaminophen 5 mg-325 mg tablet 1 tablet PO Q 6 H PRN   #12  0 Refill(s)  Pharmacy Name:  [facility] [id number]  Address:  [address]   [place], [place] [address]  Phone:  [phone]  Fax:  [phone]            Orders X-Rays: C.T. Stone Protocol Without I.V. Contrast  X-Ray Notes: History:   Hematuria: Yes / No   Patient to see MD after exam: Yes/ No   Previous exam:   When:   Where:   Diabetic: Yes / No   BUN/ Creatinine:   Date of last BUN Creatinine:   Weight in pounds:    Allergy- IV Contrast: Yes/ No  Prior Authorization #: [id number] [facility] Berkley Harvey #[id number] valid 10/20/23 thru 04/17/24           Schedule Return Visit/Planned Activity: ASAP - Schedule Surgery          Document Letter(s):  Created for Patient: Clinical Summary         Notes:   cc: Dr. Lewie Chamber         Next Appointment:      Next Appointment: 10/24/2023 04:30 PM    Appointment Type: Surgery     Location: [facility] [phone]    Provider: Jerilee Field, M.D.    Reason for Visit: OP--WL--CY, LEFT URS, HLL, STENT      * Signed by Jerilee Field, M.D. on 10/20/23 at 4:04 PM (EST)*

## 2023-10-21 NOTE — Progress Notes (Signed)
 Anesthesia Chart Review   Case: [id number] Date/Time: 10/24/23 1615   Procedure: CYSTOSCOPY/N LEFT URETEROSCOPY/HOLMIUM LASER/STENT PLACEMENT (Left) - 60 MINUTE CASE   Anesthesia type: General   Pre-op diagnosis: LEFT RENAL STONES   Location: [facility] 03 / [facility]   Surgeons: Jerilee Field, MD       DISCUSSION:68 y.o. smoker with h/o PONV, HTN, OSA on CPAP, COPD, CAD s/p CABG 2014, seizures, DM II, left renal stones scheduled for above procedure 10/24/2023 with Dr. Jerilee Field.   Pt follows with pulmonology for COPD, last seen 07/27/2023. No changes made at this visit, 6 month follow up recommended.  Pt reports her breathing is stable.  Pt not seen in PAT clinic, same day workup. Evaluate DOS.   Pt last seen by cardiology 07/11/2023. Per OV note denies cv sx, BP controlled, no changes made.  1 year follow up recommended.  VS: Ht 5\' 1"  (1.549 m)   Wt 61.7 kg   BMI 25.70 kg/m   PROVIDERS: Irven Coe, MD is PCP   Armanda Magic, MD is Cardiologist  Luciano Cutter, MD is Pulmonologist  LABS:  labs dos, same day workup (all labs ordered are listed, but only abnormal results are displayed)  Labs Reviewed - No data to display   IMAGES:   EKG:   CV: Echo 03/15/2023 1. Left ventricular ejection fraction, by estimation, is 55 to 60%. The  left ventricle has normal function. The left ventricle has no regional  wall motion abnormalities. Left ventricular diastolic parameters are  consistent with Grade I diastolic  dysfunction (impaired relaxation).   2. Right ventricular systolic function is normal. The right ventricular  size is normal. There is normal pulmonary artery systolic pressure.   3. Left atrial size was mildly dilated.   4. The mitral valve is normal in structure. Trivial mitral valve  regurgitation. No evidence of mitral stenosis.   5. The aortic valve is tricuspid. There is mild calcification of the  aortic valve. Aortic valve regurgitation is not  visualized. Aortic valve  sclerosis/calcification is present, without any evidence of aortic  stenosis.   6. The inferior vena cava is normal in size with greater than 50%  respiratory variability, suggesting right atrial pressure of 3 mmHg.   Cardiac Cath 09/08/2018  Ost LM lesion is 50% stenosed. Prox LAD-2 lesion is 100% stenosed. Prox LAD-1 lesion is 50% stenosed. Prox Cx lesion is 75% stenosed. Mid RCA lesion is 30% stenosed. LIMA. Left radial artery graft was visualized by angiography and is normal in caliber. The graft exhibits no disease. The left ventricular systolic function is normal. LV end diastolic pressure is normal. The left ventricular ejection fraction is 55-65% by visual estimate. There is no mitral valve regurgitation.   1, Left main and 2 vessel obstructive CAD.  2. Patent LIMA to the LAD 3. Patent free radial graft to the OM 4. Low LV filling pressures 5. Normal right heart pressures.    Plan: will hold chlorthalidone and lasix. Filling pressures and BP are low. Otherwise continue medical therapy. Past Medical History:  Diagnosis Date   Carotid artery stenosis    1-39% bilateral stenosis by dopplers 08/2022   Coronary artery disease    a. s/p CABG 10/2012.  cath 05/2016 with moderate LM stenosis, mild LCx and RCA stenosis and occluded LAD with patent LIMA to LAD, free radial to OM.  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   Depression    Dyslipidemia    Family history  of adverse reaction to anesthesia    mother and sister ponv   Generalized convulsive epilepsy without mention of intractable epilepsy    GERD (gastroesophageal reflux disease)    History of kidney stones    Hypertension    Memory deficit    slight   Migraine    "controlled on daily RX " (06/11/2016)   Nephrolithiasis 07/30/2015   S/p lithotripsy x 3 and right and left ureter stents   OSA (obstructive sleep apnea)    cpap    PONV (postoperative nausea and vomiting)    Seizures  (HCC)    "controlled w/daily RX; started in 2012; dr thinks they might be from the migraines; last sz was early part of 2016" (06/11/2016)   Stroke (HCC)    Type II diabetes mellitus (HCC)    metformin   Vasovagal syncope 07/30/2015   2011    Past Surgical History:  Procedure Laterality Date   CARDIAC CATHETERIZATION  2014; 06/11/2016   CARDIAC CATHETERIZATION N/A 06/11/2016   Procedure: Left Heart Cath and Cors/Grafts Angiography;  Surgeon: Tonny Bollman, MD;  Location: [facility] INVASIVE CV LAB;  Service: Cardiovascular;  Laterality: N/A;   CESAREAN SECTION     CORONARY ANGIOPLASTY     CORONARY ARTERY BYPASS GRAFT  March, 2014   LIMA to LAD, left radial to LCx x 2   CYSTOSCOPY W/ URETERAL STENT PLACEMENT Left 01/16/2019   Procedure: CYSTOSCOPY WITH RETROGRADE PYELOGRAM/URETERAL LEFT STENT PLACEMENT;  Surgeon: Crist Fat, MD;  Location: [facility];  Service: Urology;  Laterality: Left;   CYSTOSCOPY/URETEROSCOPY/HOLMIUM LASER/STENT PLACEMENT Left 01/24/2019   Procedure: CYSTOSCOPY, LEFT URETEROSCOPY, HOLMIUM LASER, STONE REMOVAL AND STENT EXCHANGE;  Surgeon: Crist Fat, MD;  Location: [facility];  Service: Urology;  Laterality: Left;   LITHOTRIPSY  X 3   RIGHT/LEFT HEART CATH AND CORONARY/GRAFT ANGIOGRAPHY N/A 09/08/2018   Procedure: RIGHT/LEFT HEART CATH AND CORONARY/GRAFT ANGIOGRAPHY;  Surgeon: [place], Peter M, MD;  Location: [facility] INVASIVE CV LAB;  Service: Cardiovascular;  Laterality: N/A;   TOTAL ABDOMINAL HYSTERECTOMY     ovaries took before hysterectomy   TUBAL LIGATION     URETERAL STENT PLACEMENT      MEDICATIONS: No current facility-administered medications for this encounter.    acetaminophen (TYLENOL) 325 MG tablet   albuterol (VENTOLIN HFA) 108 (90 Base) MCG/ACT inhaler   amLODipine (NORVASC) 5 MG tablet   ANORO ELLIPTA 62.5-25 MCG/ACT AEPB   aspirin EC 81 MG tablet   atorvastatin (LIPITOR) 40 MG tablet   carvedilol (COREG) 3.125 MG tablet   clopidogrel (PLAVIX) 75  MG tablet   escitalopram (LEXAPRO) 10 MG tablet   Evolocumab (REPATHA SURECLICK) 140 MG/ML SOAJ   isosorbide mononitrate (IMDUR) 30 MG 24 hr tablet   levETIRAcetam (KEPPRA) 500 MG tablet   lisinopril (ZESTRIL) 20 MG tablet   metFORMIN (GLUCOPHAGE-XR) 500 MG 24 hr tablet   nitroGLYCERIN (NITROSTAT) 0.4 MG SL tablet   nystatin cream (MYCOSTATIN)   vitamin B-12 (CYANOCOBALAMIN) 1000 MCG tablet   oxyCODONE-acetaminophen (PERCOCET/ROXICET) 5-325 MG tablet   tamsulosin (FLOMAX) 0.4 MG CAPS capsule     Jodell Cipro Ward, PA-C [facility] [phone]

## 2023-10-24 ENCOUNTER — Ambulatory Visit (HOSPITAL_COMMUNITY): Payer: Self-pay | Admitting: Physician Assistant

## 2023-10-24 ENCOUNTER — Ambulatory Visit (HOSPITAL_COMMUNITY)

## 2023-10-24 ENCOUNTER — Encounter (HOSPITAL_COMMUNITY)
Admission: RE | Disposition: A | Discharge status: Home / Self Care | Payer: Self-pay | Source: Home / Self Care | Attending: Urology

## 2023-10-24 ENCOUNTER — Encounter (HOSPITAL_COMMUNITY): Payer: Self-pay | Admitting: Urology

## 2023-10-24 ENCOUNTER — Ambulatory Visit (HOSPITAL_BASED_OUTPATIENT_CLINIC_OR_DEPARTMENT_OTHER): Payer: Self-pay | Admitting: Physician Assistant

## 2023-10-24 ENCOUNTER — Ambulatory Visit (HOSPITAL_COMMUNITY)
Admission: RE | Admit: 2023-10-24 | Discharge: 2023-10-24 | Disposition: A | Discharge status: Home / Self Care | Payer: Medicare HMO | Attending: Urology | Admitting: Urology

## 2023-10-24 DIAGNOSIS — I2510 Atherosclerotic heart disease of native coronary artery without angina pectoris: Secondary | ICD-10-CM

## 2023-10-24 DIAGNOSIS — Z951 Presence of aortocoronary bypass graft: Secondary | ICD-10-CM | POA: Insufficient documentation

## 2023-10-24 DIAGNOSIS — Z7984 Long term (current) use of oral hypoglycemic drugs: Secondary | ICD-10-CM | POA: Diagnosis not present

## 2023-10-24 DIAGNOSIS — K219 Gastro-esophageal reflux disease without esophagitis: Secondary | ICD-10-CM | POA: Diagnosis not present

## 2023-10-24 DIAGNOSIS — F17200 Nicotine dependence, unspecified, uncomplicated: Secondary | ICD-10-CM | POA: Insufficient documentation

## 2023-10-24 DIAGNOSIS — G4733 Obstructive sleep apnea (adult) (pediatric): Secondary | ICD-10-CM | POA: Insufficient documentation

## 2023-10-24 DIAGNOSIS — I10 Essential (primary) hypertension: Secondary | ICD-10-CM | POA: Insufficient documentation

## 2023-10-24 DIAGNOSIS — N200 Calculus of kidney: Secondary | ICD-10-CM

## 2023-10-24 DIAGNOSIS — E119 Type 2 diabetes mellitus without complications: Secondary | ICD-10-CM | POA: Diagnosis not present

## 2023-10-24 DIAGNOSIS — Z7902 Long term (current) use of antithrombotics/antiplatelets: Secondary | ICD-10-CM | POA: Diagnosis not present

## 2023-10-24 HISTORY — PX: CYSTOSCOPY/URETEROSCOPY/HOLMIUM LASER/STENT PLACEMENT: SHX6546

## 2023-10-24 HISTORY — DX: Depression, unspecified: F32.A

## 2023-10-24 LAB — GLUCOSE, CAPILLARY
Glucose-Capillary: 96 mg/dL (ref 70–99)
Glucose-Capillary: 88 mg/dL (ref 70–99)
Glucose-Capillary: 117 mg/dL — ABNORMAL HIGH (ref 70–99)

## 2023-10-24 LAB — BASIC METABOLIC PANEL
Anion gap: 8 (ref 5–15)
BUN: 10 mg/dL (ref 8–23)
CO2: 24 mmol/L (ref 22–32)
Calcium: 9.5 mg/dL (ref 8.9–10.3)
Chloride: 108 mmol/L (ref 98–111)
Creatinine, Ser: 0.68 mg/dL (ref 0.44–1.00)
GFR, Estimated: >60 mL/min (ref >60)
Glucose, Bld: 98 mg/dL (ref 70–99)
Potassium: 4.1 mmol/L (ref 3.5–5.1)
Sodium: 140 mmol/L (ref 135–145)

## 2023-10-24 LAB — HEMOGLOBIN A1C
Hgb A1c MFr Bld: 6.9 % — ABNORMAL HIGH (ref 4.8–5.6)
Mean Plasma Glucose: 151.33 mg/dL

## 2023-10-24 SURGERY — CYSTOSCOPY/URETEROSCOPY/HOLMIUM LASER/STENT PLACEMENT
Anesthesia: General | Laterality: Left

## 2023-10-24 MED ORDER — ONDANSETRON HCL 4 MG/2ML IJ SOLN
INTRAMUSCULAR | Status: DC | PRN
  Administered 2023-10-24: 4 mg via INTRAVENOUS

## 2023-10-24 MED ORDER — LIDOCAINE HCL (CARDIAC) PF 100 MG/5ML IV SOSY
PREFILLED_SYRINGE | INTRAVENOUS | Status: DC | PRN
  Administered 2023-10-24: 60 mg via INTRAVENOUS

## 2023-10-24 MED ORDER — LEVOFLOXACIN IN D5W 500 MG/100ML IV SOLN
500.0000 mg | Freq: Once | INTRAVENOUS | Status: AC
  Administered 2023-10-24: 500 mg via INTRAVENOUS
  Filled 2023-10-24: qty 100

## 2023-10-24 MED ORDER — DEXAMETHASONE SODIUM PHOSPHATE 10 MG/ML IJ SOLN
INTRAMUSCULAR | Status: DC | PRN
  Administered 2023-10-24: 10 mg via INTRAVENOUS

## 2023-10-24 MED ORDER — PROPOFOL 10 MG/ML IV BOLUS
INTRAVENOUS | Status: DC | PRN
  Administered 2023-10-24: 120 mg via INTRAVENOUS
  Administered 2023-10-24: 30 mg via INTRAVENOUS
  Administered 2023-10-24: 50 mg via INTRAVENOUS

## 2023-10-24 MED ORDER — FENTANYL CITRATE PF 50 MCG/ML IJ SOSY: 25.0000 ug | PREFILLED_SYRINGE | INTRAMUSCULAR | Status: DC | PRN

## 2023-10-24 MED ORDER — IOHEXOL 300 MG/ML  SOLN
INTRAMUSCULAR | Status: DC | PRN
  Administered 2023-10-24: 5 mL

## 2023-10-24 MED ORDER — LABETALOL HCL 5 MG/ML IV SOLN
INTRAVENOUS | Status: DC | PRN
  Administered 2023-10-24: 10 mg via INTRAVENOUS

## 2023-10-24 MED ORDER — SODIUM CHLORIDE 0.9 % IR SOLN
Status: DC | PRN
Start: 2023-10-24 — End: 2023-10-25
  Administered 2023-10-24: 3000 mL

## 2023-10-24 MED ORDER — ACETAMINOPHEN 500 MG PO TABS
1000.0000 mg | ORAL_TABLET | Freq: Once | ORAL | Status: AC
  Administered 2023-10-24: 1000 mg via ORAL
  Filled 2023-10-24: qty 2

## 2023-10-24 MED ORDER — MIDAZOLAM HCL 5 MG/5ML IJ SOLN
INTRAMUSCULAR | Status: DC | PRN
  Administered 2023-10-24: 2 mg via INTRAVENOUS

## 2023-10-24 MED ORDER — FENTANYL CITRATE (PF) 100 MCG/2ML IJ SOLN
INTRAMUSCULAR | Status: AC
  Filled 2023-10-24: qty 2

## 2023-10-24 MED ORDER — LABETALOL HCL 5 MG/ML IV SOLN
INTRAVENOUS | Status: AC
  Filled 2023-10-24: qty 4

## 2023-10-24 MED ORDER — OXYCODONE HCL 5 MG PO TABS: 5.0000 mg | ORAL_TABLET | Freq: Once | ORAL | Status: DC | PRN

## 2023-10-24 MED ORDER — FENTANYL CITRATE (PF) 100 MCG/2ML IJ SOLN
INTRAMUSCULAR | Status: DC | PRN
  Administered 2023-10-24 (×8): 25 ug via INTRAVENOUS

## 2023-10-24 MED ORDER — MIDAZOLAM HCL 2 MG/2ML IJ SOLN
INTRAMUSCULAR | Status: AC
  Filled 2023-10-24: qty 2

## 2023-10-24 MED ORDER — OXYCODONE HCL 5 MG/5ML PO SOLN: 5.0000 mg | Freq: Once | ORAL | Status: DC | PRN

## 2023-10-24 MED ORDER — LACTATED RINGERS IV SOLN: INTRAVENOUS | Status: DC

## 2023-10-24 MED ORDER — AMISULPRIDE (ANTIEMETIC) 5 MG/2ML IV SOLN: 10.0000 mg | Freq: Once | INTRAVENOUS | Status: DC | PRN

## 2023-10-24 MED ORDER — ORAL CARE MOUTH RINSE: 15.0000 mL | Freq: Once | OROMUCOSAL | Status: AC

## 2023-10-24 MED ORDER — CHLORHEXIDINE GLUCONATE 0.12 % MT SOLN
15.0000 mL | Freq: Once | OROMUCOSAL | Status: AC
  Administered 2023-10-24: 15 mL via OROMUCOSAL

## 2023-10-24 MED ORDER — PROPOFOL 10 MG/ML IV BOLUS
INTRAVENOUS | Status: AC
  Filled 2023-10-24: qty 20

## 2023-10-24 SURGICAL SUPPLY — 18 items
BAG URO CATCHER STRL LF (MISCELLANEOUS) ×1 IMPLANT
BASKET ZERO TIP NITINOL 2.4FR (BASKET) IMPLANT
CATH URETL OPEN END 6FR 70 (CATHETERS) ×1 IMPLANT
CLOTH BEACON ORANGE TIMEOUT ST (SAFETY) ×1 IMPLANT
FIBER LASER MOSES 200 DFL (Laser) IMPLANT
FIBER LASER MOSES 365 DFL (Laser) IMPLANT
GLOVE BIO SURGEON STRL SZ7.5 (GLOVE) ×1 IMPLANT
GOWN STRL REUS W/ TWL XL LVL3 (GOWN DISPOSABLE) ×1 IMPLANT
GUIDEWIRE STR DUAL SENSOR (WIRE) ×1 IMPLANT
GUIDEWIRE ZIPWRE .038 STRAIGHT (WIRE) IMPLANT
KIT TURNOVER KIT A (KITS) IMPLANT
MANIFOLD NEPTUNE II (INSTRUMENTS) ×1 IMPLANT
PACK CYSTO (CUSTOM PROCEDURE TRAY) ×1 IMPLANT
SHEATH NAVIGATOR HD 11/13X28 (SHEATH) IMPLANT
SHEATH NAVIGATOR HD 11/13X36 (SHEATH) IMPLANT
STENT URET 6FRX24 CONTOUR (STENTS) IMPLANT
TUBING CONNECTING 10 (TUBING) ×1 IMPLANT
TUBING UROLOGY SET (TUBING) ×1 IMPLANT

## 2023-10-24 NOTE — Transfer of Care (Signed)
 Immediate Anesthesia Transfer of Care Note  Patient: Samantha Huffman  Procedure(s) Performed: CYSTOSCOPY/N LEFT URETEROSCOPY/HOLMIUM LASER/STENT PLACEMENT (Left)  Patient Location: PACU  Anesthesia Type:General  Level of Consciousness: drowsy  Airway & Oxygen Therapy: Patient Spontanous Breathing and Patient connected to face mask oxygen  Post-op Assessment: Report given to RN and Post -op Vital signs reviewed and stable  Post vital signs: Reviewed and stable  Last Vitals:  Vitals Value Taken Time  BP 136/69 10/24/23 1851  Temp    Pulse 64 10/24/23 1854  Resp 20 10/24/23 1854  SpO2 99 % 10/24/23 1854  Vitals shown include unfiled device data.  Last Pain:  Vitals:   10/24/23 1541  TempSrc:   PainSc: 6          Complications: No notable events documented.

## 2023-10-24 NOTE — Interval H&P Note (Signed)
 History and Physical Interval Note:  10/24/2023 4:45 PM  Samantha Huffman  has presented today for surgery, with the diagnosis of LEFT RENAL STONES.  The various methods of treatment have been discussed with the patient and family. After consideration of risks, benefits and other options for treatment, the patient has consented to  Procedure(s) with comments: CYSTOSCOPY/N LEFT URETEROSCOPY/HOLMIUM LASER/STENT PLACEMENT (Left) - 60 MINUTE CASE as a surgical intervention.  The patient's history has been reviewed, patient examined, no change in status, stable for surgery.  I have reviewed the patient's chart and labs.  Here with her daughters.  She has not passed a stone.  Flank pain improved.  No fever or chills.  No nausea or vomiting.  No dysuria or gross hematuria.  Discussed she may need a staged procedure.  Questions were answered to the patient's satisfaction.  She elects to proceed.   Jerilee Field

## 2023-10-24 NOTE — Anesthesia Preprocedure Evaluation (Addendum)
 Anesthesia Evaluation  Patient identified by MRN, date of birth, ID band Patient awake    Reviewed: Allergy & Precautions, NPO status , Patient's Chart, lab work & pertinent test results, reviewed documented beta blocker date and time   History of Anesthesia Complications (+) PONV and history of anesthetic complications  Airway Mallampati: I  TM Distance: >3 FB Neck ROM: Full    Dental  (+) Edentulous Upper, Dental Advisory Given   Pulmonary Continuous Positive Airway Pressure Ventilation Sleep apnea: does not wear CPAP., Current SmokerPatient did not abstain from smoking.   Pulmonary exam normal breath sounds clear to auscultation       Cardiovascular hypertension, Pt. on medications and Pt. on home beta blockers + CAD and + CABG  Normal cardiovascular exam Rhythm:Regular Rate:Normal  TTE 2024  1. Left ventricular ejection fraction, by estimation, is 55 to 60%. The  left ventricle has normal function. The left ventricle has no regional  wall motion abnormalities. Left ventricular diastolic parameters are  consistent with Grade I diastolic  dysfunction (impaired relaxation).   2. Right ventricular systolic function is normal. The right ventricular  size is normal. There is normal pulmonary artery systolic pressure.   3. Left atrial size was mildly dilated.   4. The mitral valve is normal in structure. Trivial mitral valve  regurgitation. No evidence of mitral stenosis.   5. The aortic valve is tricuspid. There is mild calcification of the  aortic valve. Aortic valve regurgitation is not visualized. Aortic valve  sclerosis/calcification is present, without any evidence of aortic  stenosis.   6. The inferior vena cava is normal in size with greater than 50%  respiratory variability, suggesting right atrial pressure of 3 mmHg.   Cath 2020  Ost LM lesion is 50% stenosed.  Prox LAD-2 lesion is 100% stenosed.  Prox LAD-1  lesion is 50% stenosed.  Prox Cx lesion is 75% stenosed.  Mid RCA lesion is 30% stenosed.  LIMA.  Left radial artery graft was visualized by angiography and is normal in caliber.  The graft exhibits no disease.  The left ventricular systolic function is normal.  LV end diastolic pressure is normal.  The left ventricular ejection fraction is 55-65% by visual estimate.  There is no mitral valve regurgitation.   1, Left main and 2 vessel obstructive CAD.  2. Patent LIMA to the LAD 3. Patent free radial graft to the OM 4. Low LV filling pressures 5. Normal right heart pressures.     Neuro/Psych Seizures - (not on anti-epileptics), Well Controlled,  PSYCHIATRIC DISORDERS  Depression    CVA (right sided weakness), Residual Symptoms    GI/Hepatic Neg liver ROS,GERD  ,,  Endo/Other  diabetes, Type 2, Oral Hypoglycemic Agents    Renal/GU negative Renal ROS  negative genitourinary   Musculoskeletal negative musculoskeletal ROS (+)    Abdominal   Peds  Hematology  (+) Blood dyscrasia (plavix)   Anesthesia Other Findings   Reproductive/Obstetrics                             Anesthesia Physical Anesthesia Plan  ASA: 3  Anesthesia Plan: General   Post-op Pain Management: Tylenol PO (pre-op)*   Induction: Intravenous  PONV Risk Score and Plan: 3 and Ondansetron, Dexamethasone and Midazolam  Airway Management Planned: LMA  Additional Equipment:   Intra-op Plan:   Post-operative Plan: Extubation in OR  Informed Consent: I have reviewed the patients History and Physical,  chart, labs and discussed the procedure including the risks, benefits and alternatives for the proposed anesthesia with the patient or authorized representative who has indicated his/her understanding and acceptance.     Dental advisory given  Plan Discussed with: CRNA  Anesthesia Plan Comments:        Anesthesia Quick Evaluation

## 2023-10-24 NOTE — Op Note (Signed)
 Preoperative diagnosis: Left renal stones Postoperative diagnosis: Same  Procedure: Cystoscopy, left retrograde pyelogram, left ureteroscopy with laser lithotripsy, left ureteral stent placement  Surgeon: Mena Goes  Anesthesiologist: Salvadore Farber  Anesthesia: General  Indication for procedure: Samantha Huffman is a 68 year old female who had a history of left flank pain.  CT scan revealed an extrarenal pelvis type appearance on the left side but with a stone in the left renal pelvis and some mild perinephric stranding.  There is also a stone or stones in the left lower pole.  I reviewed the CT scan with the patient and her daughters.  We also discussed the right lower pole stone.  This would require possible staged procedure or a right ureteral stent.  Discussed this is 1 stone and we could address that in the future.  They elected to proceed with the left side.  Findings: The vulva appeared normal without mass or lesion.  The meatus appeared normal.  On cystoscopy the urethra and the bladder were unremarkable without lesion.  No stone or foreign body in the bladder.  Left retrograde pyelogram-this outlined a single ureter single collecting system unit without without dilation or stricture.  There was a filling defect in the left lower pole consistent with a stone or stones.  The renal pelvis appeared less dilated than her prior CT scan.  On ureteroscopy 3 stones were noted in the left lower pole.  There may have been another stone but it was difficult to angle that far medially.  She had longer infundibula with quite distinct upper middle and lower pole.  Description of procedure: After consent was obtained patient brought to the operating room.  After adequate anesthesia she is placed in lithotomy position and prepped and draped in the usual sterile fashion.  Timeout was performed to confirm the patient and procedure.  Cystoscope was passed per urethra and the bladder inspected.  The left ureteral orifice was  cannulated with a 6 [place] open-ended catheter and left retrograde injection of contrast was performed.  I then passed a sensor wire and coiled that in the left upper pole.  I used a short ureteral access sheath to get 2 wires in place.  The access sheath went without difficulty.  The access sheath was placed adjacent to the sensor wire leaving it as the safety wire.  A dual channel digital ureteroscope was passed without difficulty.  The kidney was inspected and I did not initially find the renal pelvic stone.  Then it was located in the left lower pole with 2 other sizable stones.  Because of the angulation a escape basket was used to grasp the stone and dropped it in the renal pelvis which it eventually washed down a midpole posterior calyx where it was dusted.  A second lower pole stone was grasped and dropped in the renal pelvis where it was dusted but again the larger fragments washed down into the same midpole calyx.  Finally the last stone was grasped and brought up to the renal pelvis.  As with the other 2 it washed into the midpole calyx where it was dusted.  No other stones were noted on fluoroscopy and no stones were noted visually.  I reinspected the upper pole which was along its own longer infundibula.  The middle pole and then the lower pole.  I cannot find any other stones and it appeared on fluoroscopy the stent stated disappeared.  Therefore the access sheath was backed out on the ureteroscope the collecting system renal pelvis and ureter  was inspected on the way out.  No significant stone fragment was noted.  No injury was noted.  The wire was then backloaded on the cystoscope and a 6 x 24 cm stent was advanced.  The wire was removed with a good coil seen in the kidney and a good coil in the bladder.  The scope was removed.  The string was taped to the patient.  She was awakened taken the PACU in stable condition.  Complications: None  Blood loss: Minimal  Specimens: None  Drains: 6 x 24  cm ureteral stent with string  Disposition: Patient stable to the PACU.  I discussed the procedure with her daughters.  Discussed postop care and follow-up.

## 2023-10-24 NOTE — Discharge Instructions (Signed)
 Removal of the stent-remove the stent by gently pulling the string as instructed on Friday morning 10/28/2023.

## 2023-10-24 NOTE — Anesthesia Procedure Notes (Signed)
 Procedure Name: LMA Insertion Date/Time: 10/24/2023 5:18 PM  Performed by: Maurene Capes, CRNAPre-anesthesia Checklist: Patient identified, Emergency Drugs available, Suction available and Patient being monitored Patient Re-evaluated:Patient Re-evaluated prior to induction Oxygen Delivery Method: Circle System Utilized Preoxygenation: Pre-oxygenation with 100% oxygen Induction Type: IV induction Ventilation: Mask ventilation without difficulty LMA: LMA inserted LMA Size: 4.0 Number of attempts: 1 Placement Confirmation: positive ETCO2 Tube secured with: Tape Dental Injury: Teeth and Oropharynx as per pre-operative assessment

## 2023-10-25 ENCOUNTER — Encounter (HOSPITAL_COMMUNITY): Payer: Self-pay | Admitting: Urology

## 2023-10-26 NOTE — Anesthesia Postprocedure Evaluation (Signed)
 Anesthesia Post Note  Patient: Avree Szczygiel  Procedure(s) Performed: CYSTOSCOPY/N LEFT URETEROSCOPY/HOLMIUM LASER/STENT PLACEMENT (Left)     Patient location during evaluation: PACU Anesthesia Type: General Level of consciousness: awake and alert Pain management: pain level controlled Vital Signs Assessment: post-procedure vital signs reviewed and stable Respiratory status: spontaneous breathing, nonlabored ventilation, respiratory function stable and patient connected to nasal cannula oxygen Cardiovascular status: blood pressure returned to baseline and stable Postop Assessment: no apparent nausea or vomiting Anesthetic complications: no  No notable events documented.  Last Vitals:  Vitals:   10/24/23 2000 10/24/23 2025  BP: (!) 161/73 (!) 177/73  Pulse: (!) 58 61  Resp: 18 20  Temp:  36.5 C  SpO2: 90% 94%    Last Pain:  Vitals:   10/24/23 2025  TempSrc: Oral  PainSc: 0-No pain                 Cornelio Parkerson L Nykeria Mealing

## 2023-11-11 ENCOUNTER — Telehealth: Payer: Self-pay | Admitting: Pharmacy Technician

## 2023-11-11 ENCOUNTER — Telehealth: Payer: Self-pay | Admitting: Cardiology

## 2023-11-11 ENCOUNTER — Encounter: Payer: Self-pay | Admitting: Pharmacy Technician

## 2023-11-11 NOTE — Telephone Encounter (Signed)
 Pt c/o medication issue:  1. Name of Medication: Repatha  2. How are you currently taking this medication (dosage and times per day)?   3. Are you having a reaction (difficulty breathing--STAT)?   4. What is your medication issue? Daughter called- she said patient had been getting help with her Repatha. She said when she went yesterday there was a Consulting civil engineer for it, she could not get it, it was too expensive. Does patient need to apply again to get assistant?

## 2023-11-11 NOTE — Telephone Encounter (Signed)
 Patient Advocate Encounter   The patient was approved for a [employer] grant that will help cover the cost of repatha Total amount awarded, 2500.00.  Effective: 10/14/23 - 10/12/24   [id number]:[id number] [employer] ID: [id number]   Pharmacy provided with approval and processing information. Patient informed via mychart   Also had to submit to [employer] diag verification

## 2023-11-11 NOTE — Telephone Encounter (Signed)
 Grant approved. Pharmacy and patient aware.

## 2023-12-22 ENCOUNTER — Ambulatory Visit (HOSPITAL_COMMUNITY)
Admission: RE | Admit: 2023-12-22 | Discharge: 2023-12-22 | Disposition: A | Discharge status: Home / Self Care | Source: Ambulatory Visit | Attending: Emergency Medicine | Admitting: Emergency Medicine

## 2023-12-22 DIAGNOSIS — Z122 Encounter for screening for malignant neoplasm of respiratory organs: Secondary | ICD-10-CM | POA: Insufficient documentation

## 2023-12-22 DIAGNOSIS — F17210 Nicotine dependence, cigarettes, uncomplicated: Secondary | ICD-10-CM | POA: Insufficient documentation

## 2023-12-22 DIAGNOSIS — Z87891 Personal history of nicotine dependence: Secondary | ICD-10-CM | POA: Insufficient documentation

## 2023-12-23 ENCOUNTER — Other Ambulatory Visit: Payer: Self-pay | Admitting: Cardiology

## 2023-12-23 ENCOUNTER — Telehealth: Payer: Self-pay | Admitting: Cardiology

## 2023-12-23 NOTE — Telephone Encounter (Signed)
 Called pt and spoke with pt's daughter to inform her that pt needed to contact her neurologist for a refill on medication levetiracetam , because this is not cardiac medication. I advised that daughter that if they have any other problems, questions or concerns, to give our office a call back. Daughter verbalized understanding

## 2023-12-23 NOTE — Telephone Encounter (Signed)
*  STAT* If patient is at the pharmacy, call can be transferred to refill team.   1. Which medications need to be refilled? (please list name of each medication and dose if known)   levETIRAcetam  (KEPPRA ) 500 MG tablet    2. Which pharmacy/location (including street and city if local pharmacy) is medication to be sent to?  [id number] - [place], [place] - [address]    3. Do they need a 30 day or 90 day supply? 90  Patient is going out of town and needs a refill on medication before leaving. Please advise

## 2024-01-10 ENCOUNTER — Encounter: Payer: Self-pay | Admitting: Neurology

## 2024-01-10 ENCOUNTER — Ambulatory Visit: Payer: Medicare HMO | Admitting: Neurology

## 2024-01-10 VITALS — BP 121/65 | HR 78 | Ht 61.0 in | Wt 126.0 lb

## 2024-01-10 DIAGNOSIS — R413 Other amnesia: Secondary | ICD-10-CM | POA: Diagnosis not present

## 2024-01-10 DIAGNOSIS — Z720 Tobacco use: Secondary | ICD-10-CM | POA: Diagnosis not present

## 2024-01-10 DIAGNOSIS — I639 Cerebral infarction, unspecified: Secondary | ICD-10-CM | POA: Diagnosis not present

## 2024-01-10 DIAGNOSIS — G40309 Generalized idiopathic epilepsy and epileptic syndromes, not intractable, without status epilepticus: Secondary | ICD-10-CM | POA: Diagnosis not present

## 2024-01-10 MED ORDER — LEVETIRACETAM 500 MG PO TABS
500.0000 mg | ORAL_TABLET | Freq: Two times a day (BID) | ORAL | 3 refills | Status: AC
Start: 2024-01-10 — End: ?

## 2024-01-10 NOTE — Patient Instructions (Addendum)
 Continue Keppra  for seizure prevention.  Continue close follow-up with primary care Strict management of vascular risk factors with a goal BP less than 130/90, A1c less than 7.0, LDL less than 70 for secondary stroke prevention  Continue aspirin  and Plavix  for stroke prevention and cardiac indications  Recommend consistent exercise program and brain stimulating activity  Recommend smoking cessation  Follow-up in 1 year.

## 2024-01-10 NOTE — Progress Notes (Signed)
 PATIENT: Samantha Huffman DOB: 04/10/1956  REASON FOR VISIT: Follow up for memory, stroke, seizures HISTORY FROM: Patient, daughter PRIMARY NEUROLOGIST: Dr. Janett Medin   ASSESSMENT AND PLAN 68 y.o. year old female   1.  Right cerebellar stroke, right PICA stroke, left thalamic stroke 2.  History of seizures 3.  Hypertension 4.  Diabetes 5.  Hyperlipidemia 6.  Post-stroke memory loss, cognitive impairment  -Continue Keppra  500 mg twice a day for stroke prevention, last seizure was around 2017 -Memory is stable, MMSE 24/30 - Continue close follow-up with primary care strict management of vascular risk factors with a goal BP less than 130/90, A1c less than 7.0, LDL less than 70 for secondary stroke prevention - Remains on aspirin  and Plavix  for stroke prevention with significant cardiac disease  - Continues to live alone, remains fairly independent - Have recommended consistent exercise program, brain stimulating activity - I recommended smoking cessation - Follow-up in 1 year or sooner if needed  HISTORY OF PRESENT ILLNESS: Update Jan 10, 2024 SS: Here with her daughter.  March 2025 A1c 6.9.  Remains on Keppra  500 mg twice a day. Living at home, alone, drives short distances.  Health issues. No seizures. She takes her pills from pill box. Takes Plavix  and aspirin  for cardiac indication. B 12 reduced to every other day. Feels memory is stable. Can't read books anymore, can't keep it in her memory. Her conversation is off, not stay on topic. BP is good 121/65 today. Weight is 126 lbs, down 6 lbs last year. Using quad cane. Doesn't do regular exercise. Just got back from [place]  for a week. The right hand has a tremor since stroke. Her right leg is stiff. On repatha . She smokes 1 pack every 2 days. Last seizure was around 2017. Her typical seizures are described as pain to left side of head, will go lay down.  MMSE 24/30 today.  Update 01/04/2023 Dr. Janett Medin : She returns for follow-up after last  visit 9 months ago.  She is accompanied by her daughter.  She states she is doing well.  She has had no recurrent stroke or TIA symptoms.  He remains on aspirin  and Plavix  which is tolerating well with minor bruising and no bleeding.  Remains on Lipitor  and Praluent  injections last lipid profile checked on 01/25/2022 had shown LDL cholesterol to be 18 mg percent.  She states her blood pressure is under good control.  She states her sugars have also been fine.  She had carotid ultrasound on 08/30/2022 which showed only minor 1-39% bilateral ICA stenosis.  Continues to have mild short-term memory difficulties be unchanged.  He still living by herself dependent in activities of daily living.  Her daughter helps with finances and for her appointments.  She does do some word searches and puzzles but does not socialize a lot  or go outdoors.    Not had any seizures since 2019 and remains on Keppra  500 mg twice daily which is tolerating well without side effects.  She has no new complaints      :  REVIEW OF SYSTEMS: Out of a complete 14 system review of symptoms, the patient complains only of the following symptoms, and all other reviewed systems are negative.  See HPI  ALLERGIES: Allergies  Allergen Reactions   Depakote [Divalproex Sodium] Other (See Comments)    Hyperammonemia   Other Other (See Comments)   Penicillins Hives, Other (See Comments) and Rash    Other reaction(s): GI intolerance Has patient had  a PCN reaction causing immediate rash, facial/tongue/throat swelling, SOB or lightheadedness with hypotension: YES  Has patient had a PCN reaction causing severe rash involving mucus membranes or skin necrosis: NO  Has patient had a PCN reaction that required hospitalizationNO  Has patient had a PCN reaction occurring within the last 10 years: NO  If all of the above answers are "NO", then may proceed with Cephalosporin use.  Other reaction(s): GI Upset (intolerance)  Other Reaction(s): GI  Intolerance  Other reaction(s): GI intolerance Has patient had a PCN reaction causing immediate rash, facial/tongue/throat swelling, SOB or lightheadedness with hypotension: YES, Has patient had a PCN reaction causing severe rash involving mucus membranes or skin necrosis: NO, Has patient had a PCN reaction that required hospitalizationNO, Has patient had a PCN reaction occurring within the last 10 years: NO, If all of the above answers are "NO", then may proceed with Cephalosporin use.    HOME MEDICATIONS: Outpatient Medications Prior to Visit  Medication Sig Dispense Refill   acetaminophen  (TYLENOL ) 325 MG tablet Take 1-2 tablets (325-650 mg total) by mouth every 4 (four) hours as needed for mild pain.     albuterol  (VENTOLIN  HFA) 108 (90 Base) MCG/ACT inhaler Inhale 2 puffs into the lungs every 6 (six) hours as needed for wheezing or shortness of breath.     amLODipine  (NORVASC ) 5 MG tablet Take 5 mg by mouth daily.     ANORO ELLIPTA 62.5-25 MCG/ACT AEPB Inhale 1 puff into the lungs daily as needed (COPD).     carvedilol  (COREG ) 3.125 MG tablet TAKE 1 TABLET BY MOUTH TWICE A DAY WITH A MEAL 180 tablet 3   clopidogrel  (PLAVIX ) 75 MG tablet Take 1 tablet (75 mg total) by mouth daily. 90 tablet 3   escitalopram  (LEXAPRO ) 10 MG tablet Take 1 tablet (10 mg total) by mouth at bedtime. 30 tablet 0   Evolocumab  (REPATHA  SURECLICK) 140 MG/ML SOAJ INJECT 140 MG INTO THE SKIN EVERY 14 (FOURTEEN) DAYS. 6 mL 3   isosorbide  mononitrate (IMDUR ) 30 MG 24 hr tablet TAKE 1/2 OF A TABLET (15 MG TOTAL) BY MOUTH DAILY 45 tablet 3   lisinopril  (ZESTRIL ) 20 MG tablet TAKE 1 TABLET BY MOUTH TWICE A DAY 180 tablet 3   metFORMIN  (GLUCOPHAGE -XR) 500 MG 24 hr tablet Take 500 mg by mouth 2 (two) times daily.     nitroGLYCERIN  (NITROSTAT ) 0.4 MG SL tablet Place 1 tablet (0.4 mg total) under the tongue every 5 (five) minutes x 3 doses as needed for chest pain. 25 tablet 4   nystatin cream (MYCOSTATIN) Apply 1 Application  topically daily as needed (Under breast).     vitamin B-12 (CYANOCOBALAMIN) 1000 MCG tablet Take 1,000 mcg by mouth every other day.     levETIRAcetam  (KEPPRA ) 500 MG tablet TAKE 1 TABLET BY MOUTH TWICE A DAY 180 tablet 3   atorvastatin  (LIPITOR ) 40 MG tablet Take 1 tablet (40 mg total) by mouth daily. 90 tablet 3   aspirin  EC 81 MG tablet Take 1 tablet (81 mg total) by mouth daily. Swallow whole. (Patient taking differently: Take 325 mg by mouth daily. Swallow whole.) 90 tablet 3   oxyCODONE -acetaminophen  (PERCOCET/ROXICET) 5-325 MG tablet Take 1 tablet by mouth 4 (four) times daily as needed.     tamsulosin (FLOMAX) 0.4 MG CAPS capsule Take 0.4 mg by mouth daily after supper.     No facility-administered medications prior to visit.    PAST MEDICAL HISTORY: Past Medical History:  Diagnosis Date  Carotid artery stenosis    1-39% bilateral stenosis by dopplers 08/2022   Coronary artery disease    a. s/p CABG 10/2012.  cath 05/2016 with moderate LM stenosis, mild LCx and RCA stenosis and occluded LAD with patent LIMA to LAD, free radial to OM.  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   Depression    Dyslipidemia    Family history of adverse reaction to anesthesia    mother and sister ponv   Generalized convulsive epilepsy without mention of intractable epilepsy    GERD (gastroesophageal reflux disease)    History of kidney stones    Hypertension    Memory deficit    slight   Migraine    "controlled on daily RX " (06/11/2016)   Nephrolithiasis 07/30/2015   S/p lithotripsy x 3 and right and left ureter stents   OSA (obstructive sleep apnea)    cpap    PONV (postoperative nausea and vomiting)    Seizures (HCC)    "controlled w/daily RX; started in 2012; dr thinks they might be from the migraines; last sz was early part of 2016" (06/11/2016)   Stroke (HCC)    Type II diabetes mellitus (HCC)    metformin    Vasovagal syncope 07/30/2015   2011    PAST SURGICAL HISTORY: Past  Surgical History:  Procedure Laterality Date   CARDIAC CATHETERIZATION  2014; 06/11/2016   CARDIAC CATHETERIZATION N/A 06/11/2016   Procedure: Left Heart Cath and Cors/Grafts Angiography;  Surgeon: Arnoldo Lapping, MD;  Location: [facility] INVASIVE CV LAB;  Service: Cardiovascular;  Laterality: N/A;   CESAREAN SECTION     CORONARY ANGIOPLASTY     CORONARY ARTERY BYPASS GRAFT  March, 2014   LIMA to LAD, left radial to LCx x 2   CYSTOSCOPY W/ URETERAL STENT PLACEMENT Left 01/16/2019   Procedure: CYSTOSCOPY WITH RETROGRADE PYELOGRAM/URETERAL LEFT STENT PLACEMENT;  Surgeon: Andrez Banker, MD;  Location: [facility];  Service: Urology;  Laterality: Left;   CYSTOSCOPY/URETEROSCOPY/HOLMIUM LASER/STENT PLACEMENT Left 01/24/2019   Procedure: CYSTOSCOPY, LEFT URETEROSCOPY, HOLMIUM LASER, STONE REMOVAL AND STENT EXCHANGE;  Surgeon: Andrez Banker, MD;  Location: [facility];  Service: Urology;  Laterality: Left;   CYSTOSCOPY/URETEROSCOPY/HOLMIUM LASER/STENT PLACEMENT Left 10/24/2023   Procedure: CYSTOSCOPY/N LEFT URETEROSCOPY/HOLMIUM LASER/STENT PLACEMENT;  Surgeon: Christina Coyer, MD;  Location: [facility];  Service: Urology;  Laterality: Left;  60 MINUTE CASE   LITHOTRIPSY  X 3   RIGHT/LEFT HEART CATH AND CORONARY/GRAFT ANGIOGRAPHY N/A 09/08/2018   Procedure: RIGHT/LEFT HEART CATH AND CORONARY/GRAFT ANGIOGRAPHY;  Surgeon: [place], Peter M, MD;  Location: [facility] INVASIVE CV LAB;  Service: Cardiovascular;  Laterality: N/A;   TOTAL ABDOMINAL HYSTERECTOMY     ovaries took before hysterectomy   TUBAL LIGATION     URETERAL STENT PLACEMENT      FAMILY HISTORY: Family History  Problem Relation Age of Onset   Cancer Father    Diabetes Father    Heart disease Sister        CAD with MI   Heart attack Sister    Breast cancer Maternal Aunt    Diabetes Brother    Stroke Brother    Diabetes Brother    Heart disease Brother        CAD with CABG    SOCIAL HISTORY: Social History   Socioeconomic History   Marital  status: Divorced    Spouse name: Not on file   Number of children: 2   Years of education: 12   Highest education level: Not  on file  Occupational History   Occupation: retired  Tobacco Use   Smoking status: Every Day    Current packs/day: 0.33    Average packs/day: 0.3 packs/day for 38.4 years (12.7 ttl pk-yrs)    Types: Cigarettes    Start date: 08/1985   Smokeless tobacco: Former    Types: Snuff, Chew    Quit date: 11/20/2012  Vaping Use   Vaping status: Never Used  Substance and Sexual Activity   Alcohol use: Yes    Comment: Occasional wine   Drug use: No   Sexual activity: Not Currently  Other Topics Concern   Not on file  Social History Narrative   Patient drinks about 1 cup of coffee daily.   Patient is right handed.    Social Drivers of Corporate investment banker Strain: Not on file  Food Insecurity: Not on file  Transportation Needs: Not on file  Physical Activity: Not on file  Stress: Not on file  Social Connections: Unknown (02/08/2023)   Received from [place] [employer]   Social Connections    How often do you feel lonely or isolated from those around you? (Adult - for ages 36 years and over): Not on file  Intimate Partner Violence: Not on file   PHYSICAL EXAM  Vitals:   01/10/24 0941  BP: 121/65  Pulse: 78  Weight: 126 lb (57.2 kg)  Height: 5\' 1"  (1.549 m)   Body mass index is 23.81 kg/m.  Generalized: Well developed pleasant middle-aged African-American lady, in no acute distress     01/10/2024   10:26 AM 12/21/2021    4:13 PM 10/06/2021    9:25 AM  MMSE - Mini Mental State Exam  Orientation to time 5 3 5   Orientation to Place 5 5 5   Registration 3 3 3   Attention/ Calculation 2 1 0  Recall 2 3 3   Language- name 2 objects 2 2 2   Language- repeat 0 1 1  Language- follow 3 step command 3 3 3   Language- read & follow direction 1 1 1   Write a sentence 0 1 1  Copy design 1 1 1   Total score 24 24 25    Neurological examination  Mentation:  Alert oriented to time, place, history taking.  MMSE 24/30.  follows all commands, speech is slightly slurred, has fragmented quality. Very pleasant.  Cranial nerve II-XII: Pupils were equal round reactive to light. Extraocular movements were full,. Facial sensation and strength were normal. Head turning and shoulder shrug  were normal and symmetric. Motor: 4/5 right arm and leg, slightly increased tone on the right leg.  Prominent action tremor in the right upper extremity is absent at rest. Sensory: Sensory testing is intact to soft touch on all 4 extremities. No evidence of extinction is noted.  Coordination: Moderate tremor with finger-nose-finger on the right Gait and station: Gait slightly wide-based, limp on the right, can walk independently short distances, uses quad cane I Reflexes: Deep tendon reflexes are symmetric but exaggerated on the right arm and leg  DIAGNOSTIC DATA (LABS, IMAGING, TESTING) - I reviewed patient records, labs, notes, testing and imaging myself where available.  Lab Results  Component Value Date   WBC 5.8 02/09/2021   HGB 12.7 02/09/2021   HCT 37.9 02/09/2021   MCV 95.2 02/09/2021   PLT 182 02/09/2021      Component Value Date/Time   NA 140 10/24/2023 1500   NA 137 10/07/2021 1028   K 4.1 10/24/2023 1500   CL  108 10/24/2023 1500   CO2 24 10/24/2023 1500   GLUCOSE 98 10/24/2023 1500   BUN 10 10/24/2023 1500   BUN 12 10/07/2021 1028   CREATININE 0.68 10/24/2023 1500   CREATININE 0.87 08/18/2020 0955   CALCIUM  9.5 10/24/2023 1500   PROT 6.7 01/31/2021 0501   PROT 7.4 09/15/2015 1026   ALBUMIN 3.7 01/31/2021 0501   ALBUMIN 4.4 09/15/2015 1026   AST 30 01/31/2021 0501   ALT 40 (H) 12/02/2021 0830   ALKPHOS 100 01/31/2021 0501   BILITOT 0.6 01/31/2021 0501   BILITOT 0.3 09/15/2015 1026   GFRNONAA >60 10/24/2023 1500   GFRAA >60 01/09/2020 0405   Lab Results  Component Value Date   CHOL 86 (L) 01/25/2022   HDL 43 01/25/2022   LDLCALC 18  01/25/2022   LDLDIRECT 67 06/13/2018   TRIG 145 01/25/2022   CHOLHDL 2.0 01/25/2022   Lab Results  Component Value Date   HGBA1C 6.9 (H) 10/24/2023   Lab Results  Component Value Date   VITAMINB12 814 10/06/2021   Lab Results  Component Value Date   TSH 1.310 07/27/2021   Cortland Ding, DNP  [facility] [address], [address] [place], [place] [address] [phone]

## 2024-01-17 ENCOUNTER — Telehealth: Payer: Self-pay | Admitting: *Deleted

## 2024-01-17 NOTE — Telephone Encounter (Signed)
 Call report from [facility]:   IMPRESSION: 1. Lung-RADS 0, incomplete. Interval development of innumerable bilateral pulmonary nodules ranging in size from 2-3 mm up to 7 mm. Most of these nodules are ill-defined and some show central cavitation. Imaging features are nonspecific and differential considerations include infection (including atypical infection such as fungal or mycobacterial) and metastatic disease. Additional lung cancer screening CT follow-up can be performed after appropriate therapy. 2.  Aortic Atherosclerosis (ICD10-I70.0).

## 2024-01-27 NOTE — Telephone Encounter (Signed)
 Dara Ear NP has reviewed these results and due to the noted atypical infectious processes seen It was advised the pt have a pulmonary consult with sputum cultures. Called and spoke with pt's daughter, Jodi Munroe (on [place]), and scheduled consult with Dr. Linder Revere on 02/02/24 at 11:30.   Will forward to Dr. Linder Revere as Arlie Lain.

## 2024-02-01 NOTE — Progress Notes (Signed)
 02/02/24- 67 yoF Smoker followed for COPD/ Emphysema, Allergic Rhinitis, Multiple Lung Nodules, Tobacco Use, complicated by HTN, DM2, HLD, Aortic Atherosclerosis, hx CVA with residual right hemiparesis, Kidney Stones, Mild OSA (5.7/hr 12/28/17),  Last seen by Dr Kassie December, 2024 Easy fatigue since CVA 2021, but not short of breath. Does have cough with scant white mucus. Denies fever, w=sweat, adenopathy. Weight stable, No hx TB exposure or cancer. Not aware of living in areas with endemic fungal infections. Still smoking 1/2 PPD- counseling done. Not ready to quit. CT images reviewed. CT chest LD screen- 12/22/23 IMPRESSION: 1. Lung-RADS 0, incomplete. Interval development of innumerable bilateral pulmonary nodules ranging in size from 2-3 mm up to 7 mm. Most of these nodules are ill-defined and some show central cavitation. Imaging features are nonspecific and differential considerations include infection (including atypical infection such as fungal or mycobacterial) and metastatic disease. Additional lung cancer screening CT follow-up can be performed after appropriate therapy. 2.  Aortic Atherosclerosis (ICD10-I70.0).   Prior to Admission medications   Medication Sig Start Date End Date Taking? Authorizing Provider  acetaminophen  (TYLENOL ) 325 MG tablet Take 1-2 tablets (325-650 mg total) by mouth every 4 (four) hours as needed for mild pain. 02/11/21   Love, Sharlet RAMAN, PA-C  albuterol  (VENTOLIN  HFA) 108 (90 Base) MCG/ACT inhaler Inhale 2 puffs into the lungs every 6 (six) hours as needed for wheezing or shortness of breath. 02/15/23   [provider]  amLODipine  (NORVASC ) 5 MG tablet Take 5 mg by mouth daily. 05/17/23   [provider]  VIVIA BECK 62.5-25 MCG/ACT AEPB Inhale 1 puff into the lungs daily as needed (COPD). 02/15/23   [provider]  atorvastatin  (LIPITOR ) 40 MG tablet Take 1 tablet (40 mg total) by mouth daily. 07/11/23 10/24/23  Shlomo Wilbert SAUNDERS,  MD  carvedilol  (COREG ) 3.125 MG tablet TAKE 1 TABLET BY MOUTH TWICE A DAY WITH A MEAL 05/18/23   Turner, Wilbert SAUNDERS, MD  clopidogrel  (PLAVIX ) 75 MG tablet Take 1 tablet (75 mg total) by mouth daily. 10/07/21   Shlomo Wilbert SAUNDERS, MD  escitalopram  (LEXAPRO ) 10 MG tablet Take 1 tablet (10 mg total) by mouth at bedtime. 02/11/21   Love, Sharlet RAMAN, PA-C  Evolocumab  (REPATHA  SURECLICK) 140 MG/ML SOAJ INJECT 140 MG INTO THE SKIN EVERY 14 (FOURTEEN) DAYS. 12/26/23   Shlomo Wilbert SAUNDERS, MD  isosorbide  mononitrate (IMDUR ) 30 MG 24 hr tablet Take 0.5 tablets (15 mg total) by mouth daily. 02/17/24   Shlomo Wilbert SAUNDERS, MD  levETIRAcetam  (KEPPRA ) 500 MG tablet Take 1 tablet (500 mg total) by mouth 2 (two) times daily. 01/10/24   Gayland Lauraine PARAS, NP  lisinopril  (ZESTRIL ) 20 MG tablet TAKE 1 TABLET BY MOUTH TWICE A DAY 05/09/23   Shlomo Wilbert SAUNDERS, MD  metFORMIN  (GLUCOPHAGE -XR) 500 MG 24 hr tablet Take 500 mg by mouth 2 (two) times daily. 05/08/23   [provider]  nitroGLYCERIN  (NITROSTAT ) 0.4 MG SL tablet Place 1 tablet (0.4 mg total) under the tongue every 5 (five) minutes x 3 doses as needed for chest pain. 10/07/21   Shlomo Wilbert SAUNDERS, MD  nystatin cream (MYCOSTATIN) Apply 1 Application topically daily as needed (Under breast). 02/15/23   [provider]  vitamin B-12 (CYANOCOBALAMIN) 1000 MCG tablet Take 1,000 mcg by mouth every other day.    [provider]    Past Medical History:  Diagnosis Date   Carotid artery stenosis    1-39% bilateral stenosis by dopplers 08/2022   Coronary  artery disease    a. s/p CABG 10/2012.  cath 05/2016 with moderate LM stenosis, mild LCx and RCA stenosis and occluded LAD with patent LIMA to LAD, free radial to OM.  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   Depression    Dyslipidemia    Family history of adverse reaction to anesthesia    mother and sister ponv   Generalized convulsive epilepsy without mention of intractable epilepsy    GERD (gastroesophageal reflux  disease)    History of kidney stones    Hypertension    Memory deficit    slight   Migraine    controlled on daily RX  (06/11/2016)   Nephrolithiasis 07/30/2015   S/p lithotripsy x 3 and right and left ureter stents   OSA (obstructive sleep apnea)    cpap    PONV (postoperative nausea and vomiting)    Seizures (HCC)    controlled w/daily RX; started in 2012; dr thinks they might be from the migraines; last sz was early part of 2016 (06/11/2016)   Stroke (HCC)    Type II diabetes mellitus (HCC)    metformin    Vasovagal syncope 07/30/2015   2011   Past Surgical History:  Procedure Laterality Date   CARDIAC CATHETERIZATION  2014; 06/11/2016   CARDIAC CATHETERIZATION N/A 06/11/2016   Procedure: Left Heart Cath and Cors/Grafts Angiography;  Surgeon: Ozell Fell, MD;  Location: [facility] INVASIVE CV LAB;  Service: Cardiovascular;  Laterality: N/A;   CESAREAN SECTION     CORONARY ANGIOPLASTY     CORONARY ARTERY BYPASS GRAFT  March, 2014   LIMA to LAD, left radial to LCx x 2   CYSTOSCOPY W/ URETERAL STENT PLACEMENT Left 01/16/2019   Procedure: CYSTOSCOPY WITH RETROGRADE PYELOGRAM/URETERAL LEFT STENT PLACEMENT;  Surgeon: Cam Morene ORN, MD;  Location: [facility];  Service: Urology;  Laterality: Left;   CYSTOSCOPY/URETEROSCOPY/HOLMIUM LASER/STENT PLACEMENT Left 01/24/2019   Procedure: CYSTOSCOPY, LEFT URETEROSCOPY, HOLMIUM LASER, STONE REMOVAL AND STENT EXCHANGE;  Surgeon: Cam Morene ORN, MD;  Location: [facility];  Service: Urology;  Laterality: Left;   CYSTOSCOPY/URETEROSCOPY/HOLMIUM LASER/STENT PLACEMENT Left 10/24/2023   Procedure: CYSTOSCOPY/N LEFT URETEROSCOPY/HOLMIUM LASER/STENT PLACEMENT;  Surgeon: Nieves Cough, MD;  Location: [facility];  Service: Urology;  Laterality: Left;  60 MINUTE CASE   LITHOTRIPSY  X 3   RIGHT/LEFT HEART CATH AND CORONARY/GRAFT ANGIOGRAPHY N/A 09/08/2018   Procedure: RIGHT/LEFT HEART CATH AND CORONARY/GRAFT ANGIOGRAPHY;  Surgeon: Swaziland, Peter M, MD;   Location: [facility] INVASIVE CV LAB;  Service: Cardiovascular;  Laterality: N/A;   TOTAL ABDOMINAL HYSTERECTOMY     ovaries took before hysterectomy   TUBAL LIGATION     URETERAL STENT PLACEMENT     Breast Cancer-related family history includes Breast cancer in her maternal aunt. ,famh Social History   Socioeconomic History   Marital status: Divorced    Spouse name: Not on file   Number of children: 2   Years of education: 12   Highest education level: Not on file  Occupational History   Occupation: retired  Tobacco Use   Smoking status: Every Day    Current packs/day: 0.33    Average packs/day: 0.3 packs/day for 38.5 years (12.7 ttl pk-yrs)    Types: Cigarettes    Start date: 08/1985   Smokeless tobacco: Former    Types: Snuff, Chew    Quit date: 11/20/2012  Vaping Use   Vaping status: Never Used  Substance and Sexual Activity   Alcohol use: Yes    Comment: Occasional wine  Drug use: No   Sexual activity: Not Currently  Other Topics Concern   Not on file  Social History Narrative   Patient drinks about 1 cup of coffee daily.   Patient is right handed.    Social Drivers of Corporate investment banker Strain: Not on file  Food Insecurity: Not on file  Transportation Needs: Not on file  Physical Activity: Not on file  Stress: Not on file  Social Connections: Unknown (02/08/2023)   Received from [employer]    How often do you feel lonely or isolated from those around you? (Adult - for ages 44 years and over): Not on file  Intimate Partner Violence: Not on file   ROS-see HPI   + = positive Constitutional:    weight loss, night sweats, fevers, chills, +fatigue, lassitude. HEENT:    headaches, difficulty swallowing, tooth/dental problems, sore throat,       sneezing, itching, ear ache, nasal congestion, post nasal drip, snoring CV:    chest pain, orthopnea, PND, swelling in lower extremities, anasarca,                                   dizziness,  palpitations Resp:   shortness of breath with exertion or at rest.                +productive cough,   non-productive cough, coughing up of blood.              change in color of mucus.  wheezing.   Skin:    rash or lesions. GI:  No-   heartburn, indigestion, abdominal pain, nausea, vomiting, diarrhea,                 change in bowel habits, loss of appetite GU: dysuria, change in color of urine, no urgency or frequency.   flank pain. MS:   joint pain, stiffness, decreased range of motion, back pain. Neuro-     nothing unusual Psych:  change in mood or affect.  depression or anxiety.   memory loss.  OBJ- Physical Exam General- Alert, Oriented, Affect-appropriate, Distress- none acute Skin- rash-none, lesions- none, excoriation- none Lymphadenopathy- none Head- atraumatic            Eyes- Gross vision intact, PERRLA, conjunctivae and secretions clear            Ears- Hearing, canals-normal            Nose- Clear, no-Septal dev, mucus, polyps, erosion, perforation             Throat- Mallampati II , mucosa clear , drainage- none, tonsils- atrophic Neck- flexible , trachea midline, no stridor , thyroid  nl, carotid no bruit Chest - symmetrical excursion , unlabored           Heart/CV- RRR , no murmur , no gallop  , no rub, nl s1 s2                           - JVD- none , edema- none, stasis changes- none, varices- none           Lung- clear to P&A, wheeze- none, cough- none , dullness-none, rub- none           Chest wall-  Abd-  Br/ Gen/ Rectal- Not done, not indicated Extrem- cyanosis- none, clubbing, none, atrophy- none, strength-  nl Neuro- grossly intact to observation

## 2024-02-02 ENCOUNTER — Other Ambulatory Visit (INDEPENDENT_AMBULATORY_CARE_PROVIDER_SITE_OTHER)

## 2024-02-02 ENCOUNTER — Encounter: Payer: Self-pay | Admitting: Internal Medicine

## 2024-02-02 ENCOUNTER — Ambulatory Visit: Admitting: Internal Medicine

## 2024-02-02 VITALS — BP 104/62 | HR 92 | Ht 61.0 in | Wt 128.0 lb

## 2024-02-02 DIAGNOSIS — R918 Other nonspecific abnormal finding of lung field: Secondary | ICD-10-CM

## 2024-02-02 DIAGNOSIS — F17210 Nicotine dependence, cigarettes, uncomplicated: Secondary | ICD-10-CM | POA: Diagnosis not present

## 2024-02-02 DIAGNOSIS — Z720 Tobacco use: Secondary | ICD-10-CM

## 2024-02-02 LAB — COMPREHENSIVE METABOLIC PANEL WITH GFR
ALT: 11 U/L (ref 0–35)
AST: 14 U/L (ref 0–37)
Albumin: 4.2 g/dL (ref 3.5–5.2)
Alkaline Phosphatase: 134 U/L — ABNORMAL HIGH (ref 39–117)
BUN: 9 mg/dL (ref 6–23)
CO2: 27 meq/L (ref 19–32)
Calcium: 9.7 mg/dL (ref 8.4–10.5)
Chloride: 103 meq/L (ref 96–112)
Creatinine, Ser: 0.65 mg/dL (ref 0.40–1.20)
GFR: 90.86 mL/min (ref >60.00)
Glucose, Bld: 102 mg/dL — ABNORMAL HIGH (ref 70–99)
Potassium: 4.5 meq/L (ref 3.5–5.1)
Sodium: 139 meq/L (ref 135–145)
Total Bilirubin: 0.4 mg/dL (ref 0.2–1.2)
Total Protein: 7.1 g/dL (ref 6.0–8.3)

## 2024-02-02 LAB — CBC WITH DIFFERENTIAL/PLATELET
Basophils Absolute: 0 10*3/uL (ref 0.0–0.1)
Basophils Relative: 0.5 % (ref 0.0–3.0)
Eosinophils Absolute: 0.2 10*3/uL (ref 0.0–0.7)
Eosinophils Relative: 2.3 % (ref 0.0–5.0)
HCT: 35.2 % — ABNORMAL LOW (ref 36.0–46.0)
Hemoglobin: 11.6 g/dL — ABNORMAL LOW (ref 12.0–15.0)
Lymphocytes Relative: 38.3 % (ref 12.0–46.0)
Lymphs Abs: 2.9 10*3/uL (ref 0.7–4.0)
MCHC: 32.9 g/dL (ref 30.0–36.0)
MCV: 89 fl (ref 78.0–100.0)
Monocytes Absolute: 0.5 10*3/uL (ref 0.1–1.0)
Monocytes Relative: 6.7 % (ref 3.0–12.0)
Neutro Abs: 4 10*3/uL (ref 1.4–7.7)
Neutrophils Relative %: 52.2 % (ref 43.0–77.0)
Platelets: 274 10*3/uL (ref 150.0–400.0)
RBC: 3.96 Mil/uL (ref 3.87–5.11)
RDW: 14.2 % (ref 11.5–15.5)
WBC: 7.6 10*3/uL (ref 4.0–10.5)

## 2024-02-02 NOTE — Patient Instructions (Signed)
 Order- lab- CBC w diff, CMET, PT, PTT, ANCA panel, Angiotensin Converting Enzyme Level, Quantiferon Gold TB assay    dx multiple lung nodules  Order- Schedule PET scan neck to thigh   dx multiple pulmonary nodules  Order- Sputum culture, smear and sensitivities- AFB and Fungal

## 2024-02-03 ENCOUNTER — Other Ambulatory Visit

## 2024-02-03 DIAGNOSIS — R918 Other nonspecific abnormal finding of lung field: Secondary | ICD-10-CM

## 2024-02-04 LAB — PT AND PTT
INR: 1 (ref 0.9–1.2)
Prothrombin Time: 10.9 s (ref 9.1–12.0)
aPTT: 28 s (ref 24–33)

## 2024-02-06 ENCOUNTER — Ambulatory Visit: Payer: Self-pay | Admitting: Internal Medicine

## 2024-02-06 DIAGNOSIS — R918 Other nonspecific abnormal finding of lung field: Secondary | ICD-10-CM

## 2024-02-07 ENCOUNTER — Telehealth: Payer: Self-pay | Admitting: Internal Medicine

## 2024-02-07 ENCOUNTER — Other Ambulatory Visit

## 2024-02-07 LAB — QUANTIFERON-TB GOLD PLUS
Mitogen-NIL: >10 [IU]/mL
NIL: 0.05 [IU]/mL
QuantiFERON-TB Gold Plus: NEGATIVE
TB1-NIL: <0 [IU]/mL
TB2-NIL: <0 [IU]/mL

## 2024-02-07 NOTE — Telephone Encounter (Signed)
 Daughter is ret Bonnie's call. Please call @ [phone]. She should be avail the rest of the day.

## 2024-02-07 NOTE — Telephone Encounter (Signed)
 Copied from CRM [id number]. Topic: Clinical - Request for Lab/Test Order >> Feb 07, 2024  9:36 AM Tyronne Galloway wrote: Reason for CRM: Dr. Linder Revere requested the patient to complete a sputum culture. Pt dropped off the sputum cups this morning to the lab, but the lab stated they did not have the orders. I asked the pt's daughter Jodi Munroe multiple times for the location of the lab or name of the lab, but she just stated she was told to take the cups over to a different location because someone named Loetta Ringer told her to do so at the pt's last visit with Dr. Linder Revere. The order appears to be a fungus culture and smear order number [id number] that needs to be faxed over.  ATC X1 LVM for patient to call our office back regarding prior message.

## 2024-02-08 NOTE — Telephone Encounter (Signed)
 Spoke with patient's daughter  Jodi Munroe regarding prior message .Gave Keya lab result's per Dr.Young  Labs- very mild anemia but otherwise labs look ok.  Audley Bleak our office is waiting for the cultures . Keya's voice was understanding. Nothing else further needed.

## 2024-02-11 LAB — ANGIOTENSIN CONVERTING ENZYME: Angiotensin-Converting Enzyme: 7 U/L — ABNORMAL LOW (ref 9–67)

## 2024-02-11 LAB — ANCA SCREEN W REFLEX TITER: ANCA SCREEN: NEGATIVE

## 2024-02-16 ENCOUNTER — Encounter (HOSPITAL_COMMUNITY)
Admission: RE | Admit: 2024-02-16 | Discharge: 2024-02-16 | Disposition: A | Discharge status: Home / Self Care | Source: Ambulatory Visit | Attending: Internal Medicine | Admitting: Internal Medicine

## 2024-02-16 DIAGNOSIS — R918 Other nonspecific abnormal finding of lung field: Secondary | ICD-10-CM | POA: Diagnosis present

## 2024-02-16 LAB — GLUCOSE, CAPILLARY: Glucose-Capillary: 93 mg/dL (ref 70–99)

## 2024-02-16 MED ORDER — FLUDEOXYGLUCOSE F - 18 (FDG) INJECTION
6.4000 | Freq: Once | INTRAVENOUS | Status: AC
  Administered 2024-02-16: 5.92 via INTRAVENOUS

## 2024-02-17 ENCOUNTER — Other Ambulatory Visit: Payer: Self-pay

## 2024-02-17 MED ORDER — ISOSORBIDE MONONITRATE ER 30 MG PO TB24: 15.0000 mg | ORAL_TABLET | Freq: Every day | ORAL | 1 refills | Status: DC

## 2024-02-21 NOTE — Progress Notes (Signed)
Pt daughter (DPR) notified

## 2024-03-06 ENCOUNTER — Encounter: Payer: Self-pay | Admitting: Internal Medicine

## 2024-03-06 DIAGNOSIS — R918 Other nonspecific abnormal finding of lung field: Secondary | ICD-10-CM | POA: Insufficient documentation

## 2024-03-06 NOTE — Assessment & Plan Note (Signed)
 Nonspecific small nodules. J=Key will be change over time, unless a primary is identified with PET. Plan- broad labs, sputum cx for AFB, fungal, PET

## 2024-03-06 NOTE — Assessment & Plan Note (Signed)
 Emphasized importance of cessation and support offered.

## 2024-03-07 LAB — FUNGUS CULTURE W SMEAR
CULTURE:: NO GROWTH
MICRO NUMBER:: 16590396
SMEAR:: NONE SEEN
SPECIMEN QUALITY:: ADEQUATE

## 2024-03-21 ENCOUNTER — Emergency Department (HOSPITAL_BASED_OUTPATIENT_CLINIC_OR_DEPARTMENT_OTHER)
Admission: EM | Admit: 2024-03-21 | Discharge: 2024-03-21 | Disposition: A | Discharge status: Home / Self Care | Attending: Emergency Medicine | Admitting: Emergency Medicine

## 2024-03-21 ENCOUNTER — Encounter (HOSPITAL_BASED_OUTPATIENT_CLINIC_OR_DEPARTMENT_OTHER): Payer: Self-pay | Admitting: Radiology

## 2024-03-21 ENCOUNTER — Emergency Department (HOSPITAL_BASED_OUTPATIENT_CLINIC_OR_DEPARTMENT_OTHER)

## 2024-03-21 ENCOUNTER — Other Ambulatory Visit: Payer: Self-pay

## 2024-03-21 DIAGNOSIS — S0990XA Unspecified injury of head, initial encounter: Secondary | ICD-10-CM

## 2024-03-21 DIAGNOSIS — W08XXXA Fall from other furniture, initial encounter: Secondary | ICD-10-CM | POA: Diagnosis not present

## 2024-03-21 DIAGNOSIS — Z720 Tobacco use: Secondary | ICD-10-CM | POA: Insufficient documentation

## 2024-03-21 DIAGNOSIS — Z7901 Long term (current) use of anticoagulants: Secondary | ICD-10-CM | POA: Insufficient documentation

## 2024-03-21 DIAGNOSIS — R220 Localized swelling, mass and lump, head: Secondary | ICD-10-CM | POA: Insufficient documentation

## 2024-03-21 DIAGNOSIS — S0083XA Contusion of other part of head, initial encounter: Secondary | ICD-10-CM | POA: Diagnosis not present

## 2024-03-21 DIAGNOSIS — W19XXXA Unspecified fall, initial encounter: Secondary | ICD-10-CM

## 2024-03-21 MED ORDER — CHLORHEXIDINE GLUCONATE 0.12 % MT SOLN: 15.0000 mL | Freq: Two times a day (BID) | OROMUCOSAL | 0 refills | Status: AC

## 2024-03-21 NOTE — ED Notes (Signed)
Patient transported to CT and back without event.

## 2024-03-21 NOTE — ED Provider Notes (Signed)
 [place] [facility] Provider Note   CSN: [id number] Arrival date & time: 03/21/24  2040     Patient presents with: [name]   Samantha Huffman is a 68 y.o. female.  She has a prior history of stroke which is left her with some right-sided deficits.  About a week ago she was getting up off the couch and fell, question tripped over something.  She struck her face.  Did not lose consciousness.  Since then she has had facial swelling and pain and headache.  Family noticed her today having more headache and so brought her here for further evaluation.  Patient is on Plavix .  She did have some oral bleeding initially but none since.  She has had a swollen upper lip.  Difficulty eating due to that.  No new numbness or weakness.  Intermittent headaches.  {Add pertinent medical, surgical, social history, OB history to YEP:67052} The history is provided by the patient and a relative.  Head Injury Location:  Frontal Time since incident:  1 week Mechanism of injury: fall   Fall:    Fall occurred:  Walking Pain details:    Quality:  Throbbing   Severity:  Moderate   Duration:  1 week   Timing:  Constant   Progression:  Improving Chronicity:  New Associated symptoms: headache and neck pain   Associated symptoms: no focal weakness, no loss of consciousness, no nausea and no vomiting        Prior to Admission medications   Medication Sig Start Date End Date Taking? Authorizing Provider  acetaminophen  (TYLENOL ) 325 MG tablet Take 1-2 tablets (325-650 mg total) by mouth every 4 (four) hours as needed for mild pain. 02/11/21   Love, Sharlet RAMAN, PA-C  albuterol  (VENTOLIN  HFA) 108 (90 Base) MCG/ACT inhaler Inhale 2 puffs into the lungs every 6 (six) hours as needed for wheezing or shortness of breath. 02/15/23   [provider]  amLODipine  (NORVASC ) 5 MG tablet Take 5 mg by mouth daily. 05/17/23   [provider]  VIVIA BECK 62.5-25 MCG/ACT AEPB Inhale 1  puff into the lungs daily as needed (COPD). 02/15/23   [provider]  atorvastatin  (LIPITOR ) 40 MG tablet Take 1 tablet (40 mg total) by mouth daily. 07/11/23 10/24/23  Shlomo Wilbert SAUNDERS, MD  carvedilol  (COREG ) 3.125 MG tablet TAKE 1 TABLET BY MOUTH TWICE A DAY WITH A MEAL 05/18/23   Turner, Wilbert SAUNDERS, MD  clopidogrel  (PLAVIX ) 75 MG tablet Take 1 tablet (75 mg total) by mouth daily. 10/07/21   Shlomo Wilbert SAUNDERS, MD  escitalopram  (LEXAPRO ) 10 MG tablet Take 1 tablet (10 mg total) by mouth at bedtime. 02/11/21   Love, Sharlet RAMAN, PA-C  Evolocumab  (REPATHA  SURECLICK) 140 MG/ML SOAJ INJECT 140 MG INTO THE SKIN EVERY 14 (FOURTEEN) DAYS. 12/26/23   Shlomo Wilbert SAUNDERS, MD  isosorbide  mononitrate (IMDUR ) 30 MG 24 hr tablet Take 0.5 tablets (15 mg total) by mouth daily. 02/17/24   Shlomo Wilbert SAUNDERS, MD  levETIRAcetam  (KEPPRA ) 500 MG tablet Take 1 tablet (500 mg total) by mouth 2 (two) times daily. 01/10/24   Gayland Lauraine PARAS, NP  lisinopril  (ZESTRIL ) 20 MG tablet TAKE 1 TABLET BY MOUTH TWICE A DAY 05/09/23   Shlomo Wilbert SAUNDERS, MD  metFORMIN  (GLUCOPHAGE -XR) 500 MG 24 hr tablet Take 500 mg by mouth 2 (two) times daily. 05/08/23   [provider]  nitroGLYCERIN  (NITROSTAT ) 0.4 MG SL tablet Place 1 tablet (0.4 mg total) under the  tongue every 5 (five) minutes x 3 doses as needed for chest pain. 10/07/21   Shlomo Wilbert SAUNDERS, MD  nystatin cream (MYCOSTATIN) Apply 1 Application topically daily as needed (Under breast). 02/15/23   [provider]  vitamin B-12 (CYANOCOBALAMIN) 1000 MCG tablet Take 1,000 mcg by mouth every other day.    [provider]    Allergies: Depakote [divalproex sodium], Other, and Penicillins    Review of Systems  Gastrointestinal:  Negative for nausea and vomiting.  Musculoskeletal:  Positive for neck pain.  Neurological:  Positive for headaches. Negative for focal weakness and loss of consciousness.    Updated Vital Signs BP (!) 152/76   Pulse 77   Temp 97.7 F (36.5 C)  (Oral)   Resp 17   Ht 5' 1 (1.549 m)   Wt 57.6 kg   SpO2 96%   BMI 24.00 kg/m   Physical Exam Vitals and nursing note reviewed.  Constitutional:      General: She is not in acute distress.    Appearance: Normal appearance. She is well-developed.  HENT:     Head: Normocephalic.     Comments: She has some tenderness over her right superior orbital ridge.  Also some tenderness right zygoma.  She has a swollen upper lip with clear fluid-filled blister.  No intraoral lesions.  She has some mild diffuse neck pain posteriorly.    Nose: Nose normal.  Eyes:     Extraocular Movements: Extraocular movements intact.     Conjunctiva/sclera: Conjunctivae normal.     Pupils: Pupils are equal, round, and reactive to light.  Cardiovascular:     Rate and Rhythm: Normal rate and regular rhythm.     Heart sounds: No murmur heard. Pulmonary:     Effort: Pulmonary effort is normal. No respiratory distress.     Breath sounds: Normal breath sounds. No stridor. No wheezing.  Abdominal:     Palpations: Abdomen is soft.     Tenderness: There is no abdominal tenderness. There is no guarding or rebound.  Musculoskeletal:        General: No tenderness or deformity. Normal range of motion.     Cervical back: Neck supple.  Skin:    General: Skin is warm and dry.  Neurological:     General: No focal deficit present.     Mental Status: She is alert.     GCS: GCS eye subscore is 4. GCS verbal subscore is 5. GCS motor subscore is 6.     Comments: She has some chronic right-sided weakness and uses a cane to ambulate.     (all labs ordered are listed, but only abnormal results are displayed) Labs Reviewed  COMPREHENSIVE METABOLIC PANEL WITH GFR  CBC  URINALYSIS, ROUTINE W REFLEX MICROSCOPIC    EKG: None  Radiology: No results found.  {Document cardiac monitor, telemetry assessment procedure when appropriate:32947} Procedures   Medications Ordered in the ED - No data to display    {Click here  for ABCD2, HEART and other calculators REFRESH Note before signing:1}                              Medical Decision Making Amount and/or Complexity of Data Reviewed Labs: ordered. Radiology: ordered.   This patient complains of ***; this involves an extensive number of treatment Options and is a complaint that carries with it a high risk of complications and morbidity. The differential includes ***  I  ordered, reviewed and interpreted labs, which included *** I ordered medication *** and reviewed PMP when indicated. I ordered imaging studies which included *** and I independently    visualized and interpreted imaging which showed *** Additional history obtained from *** Previous records obtained and reviewed *** I consulted *** and discussed lab and imaging findings and discussed disposition.  Cardiac monitoring reviewed, *** Social determinants considered, *** Critical Interventions: ***  After the interventions stated above, I reevaluated the patient and found *** Admission and further testing considered, ***   {Document critical care time when appropriate  Document review of labs and clinical decision tools ie CHADS2VASC2, etc  Document your independent review of radiology images and any outside records  Document your discussion with family members, caretakers and with consultants  Document social determinants of health affecting pt's care  Document your decision making why or why not admission, treatments were needed:32947:::1}   Final diagnoses:  None    ED Discharge Orders     None

## 2024-03-21 NOTE — ED Triage Notes (Signed)
 Pt fell last Wednesday and fell face first. Mechanical fall. Pt has a large blister on her upper lip with a wound on the inside of her lip. She also hit the right side of her head and is now having pain shoot from the left to the right side. She is also complaining of neck stiffness and intermittent dizziness and headache. She does have bruising around her mouth.

## 2024-03-21 NOTE — Discharge Instructions (Addendum)
 You were seen in the emergency department for evaluation of fall and head and face injury.  You had a CAT scan of your head face and neck that did not show any obvious fractures.  Please rest and drink plenty of fluids.  Follow-up with your primary care doctor.  Return to the emergency department if any worsening or concerning symptoms

## 2024-03-24 LAB — AFB CULTURE WITH SMEAR (NOT AT ARMC)
Acid Fast Culture: NEGATIVE
Acid Fast Smear: NEGATIVE

## 2024-03-28 NOTE — Progress Notes (Signed)
 02/02/24- 68 yoF Smoker followed for COPD/ Emphysema, Allergic Rhinitis, Multiple Lung Nodules, Tobacco Use, complicated by HTN, DM2, HLD, Aortic Atherosclerosis, hx CVA with residual right hemiparesis, Kidney Stones, Mild OSA (5.7/hr 12/28/17),  Last seen by Dr Kassie December, 2024 Easy fatigue since CVA 2021, but not short of breath. Does have cough with scant white mucus. Denies fever, w=sweat, adenopathy. Weight stable, No hx TB exposure or cancer. Not aware of living in areas with endemic fungal infections. Still smoking 1/2 PPD- counseling done. Not ready to quit. CT images reviewed. CT chest LD screen- 12/22/23 IMPRESSION: 1. Lung-RADS 0, incomplete. Interval development of innumerable bilateral pulmonary nodules ranging in size from 2-3 mm up to 7 mm. Most of these nodules are ill-defined and some show central cavitation. Imaging features are nonspecific and differential considerations include infection (including atypical infection such as fungal or mycobacterial) and metastatic disease. Additional lung cancer screening CT follow-up can be performed after appropriate therapy. 2.  Aortic Atherosclerosis (ICD10-I70.0).   03/29/24- 68 yoF Smoker followed for COPD/ Emphysema, Allergic Rhinitis, Multiple Lung Nodules, Tobacco Use, complicated by HTN, DM2, HLD, Aortic Atherosclerosis, hx CVA with residual right hemiparesis, Kidney Stones, Mild OSA (5.7/hr 12/28/17),  Last seen by Dr Kassie December, 2024 Easy fatigue since CVA 2021, but not short of breath. Does have cough with scant white mucus. Denies fever, w=sweat, adenopathy. Weight stable, No hx TB exposure or cancer. Not aware of living in areas with endemic fungal infections. Still smoking 1/2 PPD- counseling done. Not ready to quit. Lab June. 2025 sputum cx's NEG for AFB, Fungal. Quatiferon NEG.ANCA  NEG, ACE level L- 7 Daughter was concerned lack of communication- discussed so all parties understand current status. Emphasis on  smoking cessation. She emphasizes that she feels very well. Discussed the use of AI scribe software for clinical note transcription with the patient, who gave verbal consent to proceed.  History of Present Illness   Samantha Huffman is a 68 year old female with lung nodules who presents for follow-up on her lung condition.  Breathing, shortness of breath, coughing, and wheezing are stable without recent issues. She is reducing her smoking habit.  Lung nodules have decreased in size, except for a cavitating nodule in the upper right lung. A follow-up CT scan was recommended six to eight weeks after the last test in June. She is concerned about the timing of this follow-up scan.  Previous tests, including blood work and a PET scan, were negative for infections, tuberculosis, and sarcoidosis. The QuantiFERON assay for tuberculosis exposure was negative, as were tests for ANCA and sarcoid inflammation.     Assessment and Plan:    Pulmonary nodule with cavitation, right upper lung Pulmonary nodules mostly reduced in size, suggesting non-malignant etiology. Cavitation in right upper lung nodule raises concern. Negative tests for TB, fungal infections, and sarcoidosis. PET scan shows improvement, supporting non-malignant process. - Order follow-up CT scan of the chest in 6-8 weeks from last imaging to monitor cavitated nodule. - Schedule follow-up appointment to discuss CT scan results if significant changes or symptoms occur. - Ensure communication with office if results not received within 7-10 days post-CT scan.  Cardiac arrhythmia (occasional skipped beats) Occasional skipped beats present. - No immediate intervention required.   COPD mixed type- presumptive - Consider PFT  Tobacco user - Cessation support    PET scan 02/16/24 IMPRESSION: The previous extensive ill-defined nodular opacity scattered in both lungs have shown some interval improvement since the prior CT scan of 12/22/2023.  There may be 1 lesion in the right upper lobe with some cavitation. These areas do not show any abnormal uptake at this time. Changes could be infectious or inflammatory but there is a differential. Would recommend continued short follow-up CT in 6-8 weeks. Please correlate with clinical presentation.      ROS-see HPI   + = positive Constitutional:    weight loss, night sweats, fevers, chills, +fatigue, lassitude. HEENT:    headaches, difficulty swallowing, tooth/dental problems, sore throat,       sneezing, itching, ear ache, nasal congestion, post nasal drip, snoring CV:    chest pain, orthopnea, PND, swelling in lower extremities, anasarca,                                   dizziness, palpitations Resp:   shortness of breath with exertion or at rest.                +productive cough,   non-productive cough, coughing up of blood.              change in color of mucus.  wheezing.   Skin:    rash or lesions. GI:  No-   heartburn, indigestion, abdominal pain, nausea, vomiting, diarrhea,                 change in bowel habits, loss of appetite GU: dysuria, change in color of urine, no urgency or frequency.   flank pain. MS:   joint pain, stiffness, decreased range of motion, back pain. Neuro-     nothing unusual Psych:  change in mood or affect.  depression or anxiety.   memory loss.  OBJ- Physical Exam General- Alert, Oriented, Affect-appropriate, Distress- none acute Skin- rash-none, lesions- none, excoriation- none Lymphadenopathy- none Head- atraumatic            Eyes- Gross vision intact, PERRLA, conjunctivae and secretions clear            Ears- Hearing, canals-normal            Nose- Clear, no-Septal dev, mucus, polyps, erosion, perforation             Throat- Mallampati II , mucosa clear , drainage- none, tonsils- atrophic Neck- flexible , trachea midline, no stridor , thyroid  nl, carotid no bruit Chest - symmetrical excursion , unlabored           Heart/CV- RRR/ +frequent  skipped beats , no murmur , no gallop  , no rub, nl s1 s2                           - JVD- none , edema- none, stasis changes- none, varices- none           Lung- clear to P&A, wheeze- none, cough- none , dullness-none, rub- none           Chest wall-  Abd-  Br/ Gen/ Rectal- Not done, not indicated Extrem- cyanosis- none, clubbing, none, atrophy- none, strength- nl Neuro- grossly intact to observation

## 2024-03-29 ENCOUNTER — Encounter: Payer: Self-pay | Admitting: Internal Medicine

## 2024-03-29 ENCOUNTER — Ambulatory Visit: Admitting: Internal Medicine

## 2024-03-29 VITALS — BP 158/58 | HR 70 | Temp 98.0°F | Ht 61.0 in | Wt 130.0 lb

## 2024-03-29 DIAGNOSIS — R918 Other nonspecific abnormal finding of lung field: Secondary | ICD-10-CM | POA: Diagnosis not present

## 2024-03-29 DIAGNOSIS — F17210 Nicotine dependence, cigarettes, uncomplicated: Secondary | ICD-10-CM

## 2024-03-29 NOTE — Patient Instructions (Signed)
 Order- Schedule CT chest no contrast    dx multiple lung nodules  Please call us  for results about a week after the CT

## 2024-04-03 ENCOUNTER — Ambulatory Visit: Admitting: Internal Medicine

## 2024-04-08 ENCOUNTER — Encounter: Payer: Self-pay | Admitting: Internal Medicine

## 2024-04-17 ENCOUNTER — Ambulatory Visit
Admission: RE | Admit: 2024-04-17 | Discharge: 2024-04-17 | Disposition: A | Discharge status: Home / Self Care | Source: Ambulatory Visit | Attending: Internal Medicine | Admitting: Internal Medicine

## 2024-04-17 ENCOUNTER — Telehealth: Payer: Self-pay | Admitting: Pharmacy Technician

## 2024-04-17 DIAGNOSIS — R918 Other nonspecific abnormal finding of lung field: Secondary | ICD-10-CM

## 2024-04-17 NOTE — Telephone Encounter (Signed)
 Pharmacy Patient Advocate Encounter  Received notification from [facility] that Prior Authorization for repatha  has been APPROVED from 04/17/24 to 04/17/25   PA #/Case ID/Reference #: [id number]

## 2024-04-17 NOTE — Telephone Encounter (Signed)
 [facility] REPATHA   Pharmacy Patient Advocate Encounter   Received notification from Onbase that prior authorization for REPATHA  is required/requested.   Insurance verification completed.   The patient is insured through [facility] .   Per test claim: PA required; PA submitted to above mentioned insurance via Latent Key/confirmation #/EOC [facility] Status is pending

## 2024-04-30 ENCOUNTER — Other Ambulatory Visit: Payer: Self-pay

## 2024-04-30 ENCOUNTER — Ambulatory Visit: Payer: Self-pay | Admitting: Internal Medicine

## 2024-04-30 ENCOUNTER — Telehealth: Payer: Self-pay

## 2024-04-30 ENCOUNTER — Telehealth: Payer: Self-pay | Admitting: *Deleted

## 2024-04-30 DIAGNOSIS — R911 Solitary pulmonary nodule: Secondary | ICD-10-CM

## 2024-04-30 NOTE — Telephone Encounter (Signed)
 Pt's other daughter, Sherleen (dpr) called regarding the pt's CT. Sherleen was also informed of Dr Saundra note and verbalized understanding. NFN

## 2024-04-30 NOTE — Telephone Encounter (Signed)
 Clinical - Lab/Test Results >> Apr 30, 2024  3:50 PM Chantha C wrote: Reason for CRM: Patient's child Sherleen [phone] returning the office call on patient's CT scan results. Warm transferred to CMA.    04/30/24  2:54 PM Result Note Result report for daughter: Nodules are mostly definitely better, so almost certainly benign spots of inflammation or low-grade infection. Some of these have waxed and waned, so we will want to watch over time. I suggest we order a repeat CT scan with no contrast in 6 months.

## 2024-05-08 ENCOUNTER — Other Ambulatory Visit: Payer: Self-pay | Admitting: Cardiology

## 2024-05-08 DIAGNOSIS — I10 Essential (primary) hypertension: Secondary | ICD-10-CM

## 2024-08-08 ENCOUNTER — Other Ambulatory Visit: Payer: Self-pay | Admitting: Cardiology

## 2024-08-08 DIAGNOSIS — I10 Essential (primary) hypertension: Secondary | ICD-10-CM

## 2024-08-17 ENCOUNTER — Other Ambulatory Visit: Payer: Self-pay | Admitting: Cardiology

## 2024-09-02 ENCOUNTER — Other Ambulatory Visit: Payer: Self-pay | Admitting: Cardiology

## 2024-09-02 DIAGNOSIS — I10 Essential (primary) hypertension: Secondary | ICD-10-CM

## 2024-09-06 ENCOUNTER — Other Ambulatory Visit: Payer: Self-pay | Admitting: Cardiology

## 2024-09-06 DIAGNOSIS — I10 Essential (primary) hypertension: Secondary | ICD-10-CM

## 2024-09-10 NOTE — Progress Notes (Unsigned)
 "    Date:  09/11/2024   ID:  Samantha Huffman, DOB 09/30/1955, MRN [id number]  PCP:  Leonel Cole, MD  Cardiologist:  Wilbert Bihari, MD  Electrophysiologist:  None   Chief Complaint:  CAD, Carotid stenosis, HLD, HTN  History of Present Illness:    Samantha Huffman is a 69 y.o. female with a history of CAD status post CABG 10/2012 in [place], abnormal stress test 2017 but cath showed patent grafts to the OM and LAD and 30% RCA normal LV function. Patient has had periodic chest pain and was placed on Imdur  for possible microvascular angina. PCP was treating her for anxiety. 2D echo 07/2018 EF 60 to 65% with grade 1 DD.   Patient underwent cardiac catheterization 09/04/2018 due to ongoing CP and found to have patent LIMA to the LAD, patent SVGs and radial graft to the OM, normal LV function, low LV filling pressures. Chlorthalidone  and Lasix  were held because of low filling pressures and blood pressure otherwise medical therapy continued. She also has HLD, HTN, DM2, OSA on CPAP and carotid artery dz as well as tobacco abuse.  The patient is here today and is doing well.  She denies any CP or pressure, SOB, DOE, PND, orthopnea, LE edema (except when on long car rides), dizziness, palpitations or syncope.  Prior CV studies:   The following studies were reviewed today:  none  Past Medical History:  Diagnosis Date   Carotid artery stenosis    1-39% bilateral stenosis by dopplers 08/2022   Coronary artery disease    a. s/p CABG 10/2012.  cath 05/2016 with moderate LM stenosis, mild LCx and RCA stenosis and occluded LAD with patent LIMA to LAD, free radial to OM.  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   Depression    Dyslipidemia    Family history of adverse reaction to anesthesia    mother and sister ponv   Generalized convulsive epilepsy without mention of intractable epilepsy    GERD (gastroesophageal reflux disease)    History of kidney stones    Hypertension    Memory deficit    slight    Migraine    controlled on daily RX  (06/11/2016)   Nephrolithiasis 07/30/2015   S/p lithotripsy x 3 and right and left ureter stents   OSA (obstructive sleep apnea)    cpap    PONV (postoperative nausea and vomiting)    Seizures (HCC)    controlled w/daily RX; started in 2012; dr thinks they might be from the migraines; last sz was early part of 2016 (06/11/2016)   Stroke (HCC)    Type II diabetes mellitus (HCC)    metformin    Vasovagal syncope 07/30/2015   2011   Past Surgical History:  Procedure Laterality Date   CARDIAC CATHETERIZATION  2014; 06/11/2016   CARDIAC CATHETERIZATION N/A 06/11/2016   Procedure: Left Heart Cath and Cors/Grafts Angiography;  Surgeon: Ozell Fell, MD;  Location: [facility] INVASIVE CV LAB;  Service: Cardiovascular;  Laterality: N/A;   CESAREAN SECTION     CORONARY ANGIOPLASTY     CORONARY ARTERY BYPASS GRAFT  March, 2014   LIMA to LAD, left radial to LCx x 2   CYSTOSCOPY W/ URETERAL STENT PLACEMENT Left 01/16/2019   Procedure: CYSTOSCOPY WITH RETROGRADE PYELOGRAM/URETERAL LEFT STENT PLACEMENT;  Surgeon: Cam Morene ORN, MD;  Location: [facility];  Service: Urology;  Laterality: Left;   CYSTOSCOPY/URETEROSCOPY/HOLMIUM LASER/STENT PLACEMENT Left 01/24/2019   Procedure: CYSTOSCOPY, LEFT URETEROSCOPY, HOLMIUM LASER, STONE REMOVAL AND STENT  EXCHANGE;  Surgeon: Cam Morene ORN, MD;  Location: [facility];  Service: Urology;  Laterality: Left;   CYSTOSCOPY/URETEROSCOPY/HOLMIUM LASER/STENT PLACEMENT Left 10/24/2023   Procedure: CYSTOSCOPY/N LEFT URETEROSCOPY/HOLMIUM LASER/STENT PLACEMENT;  Surgeon: Nieves Cough, MD;  Location: [facility];  Service: Urology;  Laterality: Left;  60 MINUTE CASE   LITHOTRIPSY  X 3   RIGHT/LEFT HEART CATH AND CORONARY/GRAFT ANGIOGRAPHY N/A 09/08/2018   Procedure: RIGHT/LEFT HEART CATH AND CORONARY/GRAFT ANGIOGRAPHY;  Surgeon: Jordan, Peter M, MD;  Location[facility] INVASIVE CV LAB;  Service: Cardiovascular;  Laterality: N/A;   TOTAL ABDOMINAL  HYSTERECTOMY     ovaries took before hysterectomy   TUBAL LIGATION     URETERAL STENT PLACEMENT       Current Meds  Medication Sig   acetaminophen  (TYLENOL ) 325 MG tablet Take 1-2 tablets (325-650 mg total) by mouth every 4 (four) hours as needed for mild pain.   albuterol  (VENTOLIN  HFA) 108 (90 Base) MCG/ACT inhaler Inhale 2 puffs into the lungs every 6 (six) hours as needed for wheezing or shortness of breath.   amLODipine  (NORVASC ) 5 MG tablet Take 5 mg by mouth daily.   ANORO ELLIPTA 62.5-25 MCG/ACT AEPB Inhale 1 puff into the lungs daily as needed (COPD).   atorvastatin  (LIPITOR ) 40 MG tablet Take 1 tablet (40 mg total) by mouth daily.   carvedilol  (COREG ) 3.125 MG tablet TAKE 1 TABLET BY MOUTH TWICE A DAY WITH A MEAL   clopidogrel  (PLAVIX ) 75 MG tablet Take 1 tablet (75 mg total) by mouth daily.   escitalopram  (LEXAPRO ) 10 MG tablet Take 1 tablet (10 mg total) by mouth at bedtime.   Evolocumab  (REPATHA  SURECLICK) 140 MG/ML SOAJ INJECT 140 MG INTO THE SKIN EVERY 14 (FOURTEEN) DAYS.   isosorbide  mononitrate (IMDUR ) 30 MG 24 hr tablet TAKE 1/2 OF A TABLET (15 MG TOTAL) BY MOUTH DAILY   levETIRAcetam  (KEPPRA ) 500 MG tablet Take 1 tablet (500 mg total) by mouth 2 (two) times daily.   lisinopril  (ZESTRIL ) 20 MG tablet TAKE 1 TABLET BY MOUTH TWICE A DAY   metFORMIN  (GLUCOPHAGE -XR) 500 MG 24 hr tablet Take 500 mg by mouth 2 (two) times daily.   nitroGLYCERIN  (NITROSTAT ) 0.4 MG SL tablet Place 1 tablet (0.4 mg total) under the tongue every 5 (five) minutes x 3 doses as needed for chest pain.   nystatin cream (MYCOSTATIN) Apply 1 Application topically daily as needed (Under breast).   vitamin B-12 (CYANOCOBALAMIN) 1000 MCG tablet Take 1,000 mcg by mouth every other day.     Allergies:   Depakote [divalproex sodium], Other, and Penicillins   Social History   Tobacco Use   Smoking status: Every Day    Current packs/day: 0.33    Average packs/day: 0.3 packs/day for 39.1 years (12.9 ttl  pk-yrs)    Types: Cigarettes    Start date: 08/1985   Smokeless tobacco: Former    Types: Chew, Snuff    Quit date: 11/20/2012  Vaping Use   Vaping status: Never Used  Substance Use Topics   Alcohol use: Yes    Comment: Occasional wine   Drug use: No     Family Hx: The patient's family history includes Breast cancer in her maternal aunt; Cancer in her father; Diabetes in her brother, brother, and father; Heart attack in her sister; Heart disease in her brother and sister; Stroke in her brother.  ROS:   Please see the history of present illness.     All other systems reviewed and are negative.  Labs/Other Tests and Data Reviewed:    EKG Interpretation Date/Time:  Tuesday September 11 2024 08:59:13 EST Ventricular Rate:  66 PR Interval:  146 QRS Duration:  90 QT Interval:  396 QTC Calculation: 415 R Axis:   65  Text Interpretation: Normal sinus rhythm T wave abnormality, consider inferior ischemia T wave abnormality, consider anterolateral ischemia When compared with ECG of 21-Mar-2024 21:17, PREVIOUS ECG IS PRESENT Confirmed by Shlomo Corning [phone]) on 09/11/2024 9:03:03 AM  EKG Interpretation Date/Time:  Tuesday September 11 2024 08:59:13 EST Ventricular Rate:  66 PR Interval:  146 QRS Duration:  90 QT Interval:  396 QTC Calculation: 415 R Axis:   65  Text Interpretation: Normal sinus rhythm T wave abnormality, consider inferior ischemia T wave abnormality, consider anterolateral ischemia When compared with ECG of 21-Mar-2024 21:17, PREVIOUS ECG IS PRESENT Confirmed by Shlomo Corning ([id number]) on 09/11/2024 9:03:03 AM    Recent Labs: 02/02/2024: ALT 11; BUN 9; Creatinine, Ser 0.65; Hemoglobin 11.6; Platelets 274.0; Potassium 4.5; Sodium 139   Recent Lipid Panel Lab Results  Component Value Date/Time   CHOL 86 (L) 01/25/2022 11:31 AM   TRIG 145 01/25/2022 11:31 AM   HDL 43 01/25/2022 11:31 AM   CHOLHDL 2.0 01/25/2022 11:31 AM   CHOLHDL 2.9 01/21/2021 05:08 AM   LDLCALC  18 01/25/2022 11:31 AM   LDLCALC 44 08/18/2020 09:45 AM   LDLDIRECT 67 06/13/2018 03:17 PM    Wt Readings from Last 3 Encounters:  09/11/24 137 lb (62.1 kg)  03/29/24 130 lb (59 kg)  03/21/24 127 lb (57.6 kg)     Objective:    Vital Signs:  BP 104/60   Pulse 66   Ht 5' 1 (1.549 m)   Wt 137 lb (62.1 kg)   SpO2 97%   BMI 25.89 kg/m   GEN: Well nourished, well developed in no acute distress HEENT: Normal NECK: No JVD; No carotid bruits LYMPHATICS: No lymphadenopathy CARDIAC:RRR, no murmurs, rubs, gallops RESPIRATORY:  Clear to auscultation without rales, wheezing or rhonchi  ABDOMEN: Soft, non-tender, non-distended MUSCULOSKELETAL:  No edema; No deformity  SKIN: Warm and dry NEUROLOGIC:  Alert and oriented x 3 PSYCHIATRIC:  Normal affect   ASSESSMENT & PLAN:    ASCAD  -CABG x2 in 2014 with LIMA to the LAD and free radial to the OM with recent cath 09/04/2018 with patent LIMA to the LAD and patent free radial graft to the OM.  -She denies any anginal sx -continue ASA 81mg  daily, Atorvastatin  80mg  daily, Carvedilol  3.125mg  BID, Plavix  75mg  daily, Imdur  15mg  daily with PRN refills  HTN  -BP controlled on exam today -continue Amlodipine  5mg  daily, Carvedilol  3.125mg  BID, Lisinopril  20mg  BID with PRN refills  Hyperlipidemia  -LDL goal is < 70.  -I have personally reviewed and interpreted outside labs performed by patient's PCP which showed LDL 20, HDL 56 on 03/07/2024 and ALT 11 on 02/02/2024 -Continue Repatha  -decrease Atorvastatin  to 20mg  daily since LDL so low -check FLP and ALT In 8 weeks  Carotid artery stenosis  -dopplers 08/2020 and 09/11/2022 showed 1-39% bilateral stenosis  -continue ASA and statin.  -repeat carotid dopplers   OSA  -minimal with AHI 5.7/hr -intolerant to CPAP   SOB -this has resolved -cath 08/2018 showed patent grafts and SOB has not changed since then -LVF is normal  Syncope -Felt to be orthostatic in nature on prior visits -2D echo  showed normal LV function   -heart monitor showed sinus rhythm with a 5 beat run of  NSVT, 1 episode of paroxysmal atrial tachycardia and occasional PVCs -Denies any further dizziness or syncope   Medication Adjustments/Labs and Tests Ordered: Current medicines are reviewed at length with the patient today.  Concerns regarding medicines are outlined above.  Tests Ordered: Orders Placed This Encounter  Procedures   EKG 12-Lead   Medication Changes: No orders of the defined types were placed in this encounter.   Disposition:  Follow up in 1 year(s)  Signed, Wilbert Bihari, MD  09/11/2024 9:04 AM    [facility] "

## 2024-09-11 ENCOUNTER — Ambulatory Visit: Attending: Cardiology | Admitting: Cardiology

## 2024-09-11 ENCOUNTER — Encounter: Payer: Self-pay | Admitting: Cardiology

## 2024-09-11 ENCOUNTER — Other Ambulatory Visit (HOSPITAL_COMMUNITY): Payer: Self-pay

## 2024-09-11 ENCOUNTER — Telehealth: Payer: Self-pay | Admitting: Pharmacy Technician

## 2024-09-11 VITALS — BP 104/60 | HR 66 | Ht 61.0 in | Wt 137.0 lb

## 2024-09-11 DIAGNOSIS — G4733 Obstructive sleep apnea (adult) (pediatric): Secondary | ICD-10-CM | POA: Diagnosis not present

## 2024-09-11 DIAGNOSIS — R0602 Shortness of breath: Secondary | ICD-10-CM | POA: Diagnosis not present

## 2024-09-11 DIAGNOSIS — I10 Essential (primary) hypertension: Secondary | ICD-10-CM | POA: Diagnosis not present

## 2024-09-11 DIAGNOSIS — I6523 Occlusion and stenosis of bilateral carotid arteries: Secondary | ICD-10-CM

## 2024-09-11 DIAGNOSIS — I2510 Atherosclerotic heart disease of native coronary artery without angina pectoris: Secondary | ICD-10-CM | POA: Diagnosis not present

## 2024-09-11 DIAGNOSIS — R55 Syncope and collapse: Secondary | ICD-10-CM

## 2024-09-11 DIAGNOSIS — E785 Hyperlipidemia, unspecified: Secondary | ICD-10-CM

## 2024-09-11 DIAGNOSIS — Z79899 Other long term (current) drug therapy: Secondary | ICD-10-CM

## 2024-09-11 MED ORDER — AMLODIPINE BESYLATE 5 MG PO TABS: 5.0000 mg | ORAL_TABLET | Freq: Every day | ORAL | 3 refills | Status: AC

## 2024-09-11 MED ORDER — ATORVASTATIN CALCIUM 20 MG PO TABS: 20.0000 mg | ORAL_TABLET | Freq: Every day | ORAL | 3 refills | Status: AC

## 2024-09-11 MED ORDER — CLOPIDOGREL BISULFATE 75 MG PO TABS: 75.0000 mg | ORAL_TABLET | Freq: Every day | ORAL | 3 refills | Status: AC

## 2024-09-11 MED ORDER — CARVEDILOL 3.125 MG PO TABS: 3.1250 mg | ORAL_TABLET | Freq: Two times a day (BID) | ORAL | 3 refills | Status: AC

## 2024-09-11 MED ORDER — ISOSORBIDE MONONITRATE ER 30 MG PO TB24: 15.0000 mg | ORAL_TABLET | Freq: Every day | ORAL | 3 refills | Status: AC

## 2024-09-11 MED ORDER — REPATHA SURECLICK 140 MG/ML ~~LOC~~ SOAJ: 140.0000 mg | SUBCUTANEOUS | 3 refills | Status: AC

## 2024-09-11 MED ORDER — LISINOPRIL 20 MG PO TABS: 20.0000 mg | ORAL_TABLET | Freq: Two times a day (BID) | ORAL | 0 refills | Status: AC

## 2024-09-11 NOTE — Telephone Encounter (Signed)
 Patient Advocate Encounter   The patient was approved for a [employer] grant that will help cover the cost of repatha  Total amount awarded, 2500.00.  Effective: 10/13/24 - 10/12/25   APW:[id number] ERW:[id number] Hmnle:[id number] PI:[id number]  [employer] ID: [id number]   Pharmacy provided with approval and processing information. Patient informed via mychart

## 2024-09-11 NOTE — Patient Instructions (Signed)
 Medication Instructions:  Please DECREASE your dose of atorvastatin  to 20 mg daily.  *If you need a refill on your cardiac medications before your next appointment, please call your pharmacy*  Lab Work: Please complete a FASTING lipid panel and an ALT at any [facility] in 8 weeks.  If you have labs (blood work) drawn today and your tests are completely normal, you will receive your results only by: MyChart Message (if you have MyChart) OR A paper copy in the mail If you have any lab test that is abnormal or we need to change your treatment, we will call you to review the results.  Testing/Procedures: Your physician has requested that you have a carotid duplex. This test is an ultrasound of the carotid arteries in your neck. It looks at blood flow through these arteries that supply the brain with blood. Allow one hour for this exam. There are no restrictions or special instructions.   Follow-Up: At [facility], you and your health needs are our priority.  As part of our continuing mission to provide you with exceptional heart care, our providers are all part of one team.  This team includes your primary Cardiologist (physician) and Advanced Practice Providers or APPs (Physician Assistants and Nurse Practitioners) who all work together to provide you with the care you need, when you need it.  Your next appointment:   1 year(s)  Provider:   Wilbert Bihari, MD

## 2024-09-11 NOTE — Addendum Note (Signed)
 Addended by: JANIT GENI CROME on: 09/11/2024 09:20 AM   Modules accepted: Orders

## 2024-09-24 ENCOUNTER — Encounter (HOSPITAL_COMMUNITY): Payer: Medicare HMO

## 2024-09-27 ENCOUNTER — Other Ambulatory Visit: Payer: Self-pay | Admitting: Pulmonary Disease

## 2024-09-27 DIAGNOSIS — R918 Other nonspecific abnormal finding of lung field: Secondary | ICD-10-CM

## 2024-10-01 ENCOUNTER — Ambulatory Visit (HOSPITAL_COMMUNITY)

## 2024-10-23 ENCOUNTER — Other Ambulatory Visit

## 2025-01-09 ENCOUNTER — Ambulatory Visit: Admitting: Neurology
